# Patient Record
Sex: Female | Born: 1955 | Race: White | Hispanic: No | Smoking: Former smoker
Health system: Southern US, Community
[De-identification: ages and names within clinical notes are randomized; demographics above are authoritative.]

## PROBLEM LIST (undated history)

## (undated) DIAGNOSIS — N3010 Interstitial cystitis (chronic) without hematuria: Secondary | ICD-10-CM

## (undated) DIAGNOSIS — C3490 Malignant neoplasm of unspecified part of unspecified bronchus or lung: Secondary | ICD-10-CM

## (undated) DIAGNOSIS — G8929 Other chronic pain: Secondary | ICD-10-CM

## (undated) DIAGNOSIS — F419 Anxiety disorder, unspecified: Secondary | ICD-10-CM

## (undated) DIAGNOSIS — Z720 Tobacco use: Secondary | ICD-10-CM

## (undated) DIAGNOSIS — J449 Chronic obstructive pulmonary disease, unspecified: Secondary | ICD-10-CM

## (undated) DIAGNOSIS — K219 Gastro-esophageal reflux disease without esophagitis: Secondary | ICD-10-CM

## (undated) DIAGNOSIS — F32A Depression, unspecified: Secondary | ICD-10-CM

## (undated) DIAGNOSIS — J439 Emphysema, unspecified: Secondary | ICD-10-CM

## (undated) DIAGNOSIS — M549 Dorsalgia, unspecified: Secondary | ICD-10-CM

## (undated) HISTORY — PX: ABDOMINAL HYSTERECTOMY: SHX81

## (undated) HISTORY — DX: Gastro-esophageal reflux disease without esophagitis: K21.9

## (undated) HISTORY — PX: ILEO LOOP NEOBLADDER: SHX1781

## (undated) HISTORY — DX: Emphysema, unspecified: J43.9

## (undated) HISTORY — PX: BACK SURGERY: SHX140

## (undated) HISTORY — DX: Depression, unspecified: F32.A

## (undated) HISTORY — PX: VESICOVAGINAL FISTULA REPAIR: SHX320

---

## 1997-08-26 ENCOUNTER — Ambulatory Visit (HOSPITAL_COMMUNITY): Admission: RE | Admit: 1997-08-26 | Discharge: 1997-08-26 | Payer: Self-pay

## 1997-12-07 ENCOUNTER — Observation Stay (HOSPITAL_COMMUNITY): Admission: RE | Admit: 1997-12-07 | Discharge: 1997-12-08 | Payer: Self-pay | Admitting: Gynecologic Oncology

## 1997-12-28 ENCOUNTER — Ambulatory Visit: Admission: RE | Admit: 1997-12-28 | Discharge: 1997-12-28 | Payer: Self-pay | Admitting: Gynecologic Oncology

## 1998-01-31 ENCOUNTER — Ambulatory Visit: Admission: RE | Admit: 1998-01-31 | Discharge: 1998-01-31 | Payer: Self-pay | Admitting: Gynecologic Oncology

## 1998-03-21 ENCOUNTER — Encounter: Payer: Self-pay | Admitting: Gynecologic Oncology

## 1998-03-21 ENCOUNTER — Ambulatory Visit: Admission: RE | Admit: 1998-03-21 | Discharge: 1998-03-21 | Payer: Self-pay | Admitting: Gynecologic Oncology

## 1998-04-10 ENCOUNTER — Encounter: Payer: Self-pay | Admitting: Gastroenterology

## 1998-04-10 ENCOUNTER — Ambulatory Visit (HOSPITAL_COMMUNITY): Admission: RE | Admit: 1998-04-10 | Discharge: 1998-04-10 | Payer: Self-pay | Admitting: Gastroenterology

## 1998-05-07 ENCOUNTER — Emergency Department (HOSPITAL_COMMUNITY): Admission: EM | Admit: 1998-05-07 | Discharge: 1998-05-07 | Payer: Self-pay | Admitting: Emergency Medicine

## 1998-05-09 ENCOUNTER — Ambulatory Visit: Admission: RE | Admit: 1998-05-09 | Discharge: 1998-05-09 | Payer: Self-pay | Admitting: Gynecologic Oncology

## 1998-12-05 ENCOUNTER — Ambulatory Visit: Admission: RE | Admit: 1998-12-05 | Discharge: 1998-12-05 | Payer: Self-pay | Admitting: Gynecologic Oncology

## 1999-03-02 ENCOUNTER — Inpatient Hospital Stay (HOSPITAL_COMMUNITY): Admission: AD | Admit: 1999-03-02 | Discharge: 1999-03-03 | Payer: Self-pay | Admitting: Cardiology

## 1999-03-08 ENCOUNTER — Ambulatory Visit (HOSPITAL_COMMUNITY): Admission: RE | Admit: 1999-03-08 | Discharge: 1999-03-08 | Payer: Self-pay | Admitting: Orthopedic Surgery

## 1999-03-08 ENCOUNTER — Encounter: Payer: Self-pay | Admitting: Orthopedic Surgery

## 1999-04-02 ENCOUNTER — Ambulatory Visit (HOSPITAL_COMMUNITY): Admission: RE | Admit: 1999-04-02 | Discharge: 1999-04-02 | Payer: Self-pay | Admitting: Pediatrics

## 1999-04-02 ENCOUNTER — Encounter: Payer: Self-pay | Admitting: Orthopedic Surgery

## 1999-04-02 ENCOUNTER — Encounter: Payer: Self-pay | Admitting: Neurological Surgery

## 1999-04-19 ENCOUNTER — Encounter: Payer: Self-pay | Admitting: Neurological Surgery

## 1999-04-19 ENCOUNTER — Inpatient Hospital Stay (HOSPITAL_COMMUNITY): Admission: RE | Admit: 1999-04-19 | Discharge: 1999-04-23 | Payer: Self-pay | Admitting: Neurological Surgery

## 1999-07-03 ENCOUNTER — Ambulatory Visit: Admission: RE | Admit: 1999-07-03 | Discharge: 1999-07-03 | Payer: Self-pay | Admitting: Gynecologic Oncology

## 1999-07-18 ENCOUNTER — Encounter: Payer: Self-pay | Admitting: Neurological Surgery

## 1999-07-18 ENCOUNTER — Encounter: Admission: RE | Admit: 1999-07-18 | Discharge: 1999-07-18 | Payer: Self-pay | Admitting: Neurological Surgery

## 1999-08-15 ENCOUNTER — Encounter: Admission: RE | Admit: 1999-08-15 | Discharge: 1999-09-21 | Payer: Self-pay | Admitting: Neurological Surgery

## 1999-10-05 ENCOUNTER — Encounter: Payer: Self-pay | Admitting: Neurological Surgery

## 1999-10-05 ENCOUNTER — Encounter: Admission: RE | Admit: 1999-10-05 | Discharge: 1999-10-05 | Payer: Self-pay | Admitting: Neurological Surgery

## 1999-10-11 ENCOUNTER — Encounter: Admission: RE | Admit: 1999-10-11 | Discharge: 1999-12-28 | Payer: Self-pay | Admitting: Neurological Surgery

## 1999-12-20 ENCOUNTER — Ambulatory Visit (HOSPITAL_COMMUNITY): Admission: RE | Admit: 1999-12-20 | Discharge: 1999-12-20 | Payer: Self-pay | Admitting: Neurological Surgery

## 1999-12-20 ENCOUNTER — Encounter: Payer: Self-pay | Admitting: Neurological Surgery

## 2000-01-03 ENCOUNTER — Encounter: Payer: Self-pay | Admitting: Family Medicine

## 2000-01-03 ENCOUNTER — Encounter: Admission: RE | Admit: 2000-01-03 | Discharge: 2000-01-03 | Payer: Self-pay | Admitting: Family Medicine

## 2000-04-16 ENCOUNTER — Ambulatory Visit (HOSPITAL_COMMUNITY): Admission: RE | Admit: 2000-04-16 | Discharge: 2000-04-16 | Payer: Self-pay | Admitting: Neurological Surgery

## 2000-04-16 ENCOUNTER — Encounter: Payer: Self-pay | Admitting: Neurological Surgery

## 2000-06-12 ENCOUNTER — Encounter: Payer: Self-pay | Admitting: Neurological Surgery

## 2000-06-12 ENCOUNTER — Inpatient Hospital Stay (HOSPITAL_COMMUNITY): Admission: RE | Admit: 2000-06-12 | Discharge: 2000-06-14 | Payer: Self-pay | Admitting: Neurological Surgery

## 2000-07-16 ENCOUNTER — Encounter: Admission: RE | Admit: 2000-07-16 | Discharge: 2000-07-16 | Payer: Self-pay | Admitting: Neurological Surgery

## 2000-07-16 ENCOUNTER — Encounter: Payer: Self-pay | Admitting: Neurological Surgery

## 2000-09-02 ENCOUNTER — Encounter: Payer: Self-pay | Admitting: Neurological Surgery

## 2000-09-02 ENCOUNTER — Ambulatory Visit (HOSPITAL_COMMUNITY): Admission: RE | Admit: 2000-09-02 | Discharge: 2000-09-02 | Payer: Self-pay | Admitting: Neurological Surgery

## 2000-09-25 ENCOUNTER — Encounter: Admission: RE | Admit: 2000-09-25 | Discharge: 2000-10-29 | Payer: Self-pay | Admitting: Neurological Surgery

## 2000-11-25 ENCOUNTER — Ambulatory Visit: Admission: RE | Admit: 2000-11-25 | Discharge: 2000-11-25 | Payer: Self-pay | Admitting: Gynecology

## 2000-11-25 ENCOUNTER — Other Ambulatory Visit: Admission: RE | Admit: 2000-11-25 | Discharge: 2000-11-25 | Payer: Self-pay | Admitting: Gynecology

## 2000-12-04 ENCOUNTER — Ambulatory Visit (HOSPITAL_COMMUNITY): Admission: RE | Admit: 2000-12-04 | Discharge: 2000-12-04 | Payer: Self-pay | Admitting: Neurological Surgery

## 2000-12-04 ENCOUNTER — Encounter: Payer: Self-pay | Admitting: Neurological Surgery

## 2001-01-20 ENCOUNTER — Ambulatory Visit (HOSPITAL_COMMUNITY): Admission: RE | Admit: 2001-01-20 | Discharge: 2001-01-20 | Payer: Self-pay | Admitting: Family Medicine

## 2001-01-20 ENCOUNTER — Encounter: Payer: Self-pay | Admitting: Family Medicine

## 2001-01-30 ENCOUNTER — Encounter: Admission: RE | Admit: 2001-01-30 | Discharge: 2001-02-07 | Payer: Self-pay | Admitting: Anesthesiology

## 2001-02-11 ENCOUNTER — Encounter: Payer: Self-pay | Admitting: Family Medicine

## 2001-02-11 ENCOUNTER — Encounter: Admission: RE | Admit: 2001-02-11 | Discharge: 2001-02-11 | Payer: Self-pay | Admitting: Family Medicine

## 2001-12-29 ENCOUNTER — Encounter (INDEPENDENT_AMBULATORY_CARE_PROVIDER_SITE_OTHER): Payer: Self-pay | Admitting: *Deleted

## 2001-12-29 ENCOUNTER — Ambulatory Visit: Admission: RE | Admit: 2001-12-29 | Discharge: 2001-12-29 | Payer: Self-pay | Admitting: Gynecologic Oncology

## 2001-12-29 ENCOUNTER — Other Ambulatory Visit: Admission: RE | Admit: 2001-12-29 | Discharge: 2001-12-29 | Payer: Self-pay | Admitting: Gynecologic Oncology

## 2002-09-10 ENCOUNTER — Ambulatory Visit (HOSPITAL_BASED_OUTPATIENT_CLINIC_OR_DEPARTMENT_OTHER): Admission: RE | Admit: 2002-09-10 | Discharge: 2002-09-10 | Payer: Self-pay | Admitting: Urology

## 2002-09-10 ENCOUNTER — Encounter (INDEPENDENT_AMBULATORY_CARE_PROVIDER_SITE_OTHER): Payer: Self-pay | Admitting: *Deleted

## 2003-02-22 ENCOUNTER — Encounter: Admission: RE | Admit: 2003-02-22 | Discharge: 2003-02-22 | Payer: Self-pay | Admitting: Family Medicine

## 2003-02-22 ENCOUNTER — Encounter: Payer: Self-pay | Admitting: Family Medicine

## 2003-05-04 ENCOUNTER — Ambulatory Visit (HOSPITAL_COMMUNITY): Admission: RE | Admit: 2003-05-04 | Discharge: 2003-05-04 | Payer: Self-pay | Admitting: Neurological Surgery

## 2003-06-15 ENCOUNTER — Emergency Department (HOSPITAL_COMMUNITY): Admission: EM | Admit: 2003-06-15 | Discharge: 2003-06-15 | Payer: Self-pay

## 2003-06-16 ENCOUNTER — Inpatient Hospital Stay (HOSPITAL_COMMUNITY): Admission: EM | Admit: 2003-06-16 | Discharge: 2003-06-17 | Payer: Self-pay | Admitting: Emergency Medicine

## 2003-08-02 ENCOUNTER — Ambulatory Visit (HOSPITAL_COMMUNITY): Admission: RE | Admit: 2003-08-02 | Discharge: 2003-08-03 | Payer: Self-pay | Admitting: Neurological Surgery

## 2003-08-12 ENCOUNTER — Ambulatory Visit (HOSPITAL_COMMUNITY): Admission: RE | Admit: 2003-08-12 | Discharge: 2003-08-12 | Payer: Self-pay | Admitting: Urology

## 2003-08-26 ENCOUNTER — Ambulatory Visit (HOSPITAL_COMMUNITY): Admission: RE | Admit: 2003-08-26 | Discharge: 2003-08-26 | Payer: Self-pay | Admitting: Urology

## 2003-08-26 ENCOUNTER — Ambulatory Visit (HOSPITAL_BASED_OUTPATIENT_CLINIC_OR_DEPARTMENT_OTHER): Admission: RE | Admit: 2003-08-26 | Discharge: 2003-08-26 | Payer: Self-pay | Admitting: Urology

## 2004-06-18 ENCOUNTER — Inpatient Hospital Stay (HOSPITAL_COMMUNITY): Admission: RE | Admit: 2004-06-18 | Discharge: 2004-06-26 | Payer: Self-pay | Admitting: Urology

## 2004-06-19 ENCOUNTER — Encounter (INDEPENDENT_AMBULATORY_CARE_PROVIDER_SITE_OTHER): Payer: Self-pay | Admitting: Specialist

## 2004-08-02 ENCOUNTER — Ambulatory Visit (HOSPITAL_COMMUNITY): Admission: RE | Admit: 2004-08-02 | Discharge: 2004-08-02 | Payer: Self-pay | Admitting: Urology

## 2004-08-28 ENCOUNTER — Ambulatory Visit (HOSPITAL_COMMUNITY): Admission: RE | Admit: 2004-08-28 | Discharge: 2004-08-28 | Payer: Self-pay | Admitting: Urology

## 2004-09-19 ENCOUNTER — Inpatient Hospital Stay (HOSPITAL_COMMUNITY): Admission: RE | Admit: 2004-09-19 | Discharge: 2004-09-25 | Payer: Self-pay | Admitting: Urology

## 2004-10-10 ENCOUNTER — Ambulatory Visit (HOSPITAL_COMMUNITY): Admission: RE | Admit: 2004-10-10 | Discharge: 2004-10-10 | Payer: Self-pay | Admitting: Urology

## 2005-02-19 ENCOUNTER — Ambulatory Visit (HOSPITAL_BASED_OUTPATIENT_CLINIC_OR_DEPARTMENT_OTHER): Admission: RE | Admit: 2005-02-19 | Discharge: 2005-02-19 | Payer: Self-pay | Admitting: Urology

## 2005-02-19 ENCOUNTER — Ambulatory Visit (HOSPITAL_COMMUNITY): Admission: RE | Admit: 2005-02-19 | Discharge: 2005-02-19 | Payer: Self-pay | Admitting: Urology

## 2005-03-11 ENCOUNTER — Observation Stay (HOSPITAL_COMMUNITY): Admission: RE | Admit: 2005-03-11 | Discharge: 2005-03-12 | Payer: Self-pay | Admitting: Urology

## 2005-08-06 ENCOUNTER — Ambulatory Visit (HOSPITAL_BASED_OUTPATIENT_CLINIC_OR_DEPARTMENT_OTHER): Admission: RE | Admit: 2005-08-06 | Discharge: 2005-08-06 | Payer: Self-pay | Admitting: Urology

## 2005-09-10 ENCOUNTER — Encounter: Admission: RE | Admit: 2005-09-10 | Discharge: 2005-09-10 | Payer: Self-pay | Admitting: Family Medicine

## 2007-05-11 ENCOUNTER — Emergency Department (HOSPITAL_COMMUNITY): Admission: EM | Admit: 2007-05-11 | Discharge: 2007-05-11 | Payer: Self-pay | Admitting: Emergency Medicine

## 2007-05-14 ENCOUNTER — Encounter: Admission: RE | Admit: 2007-05-14 | Discharge: 2007-05-14 | Payer: Self-pay | Admitting: Gastroenterology

## 2009-05-11 ENCOUNTER — Ambulatory Visit (HOSPITAL_COMMUNITY): Admission: RE | Admit: 2009-05-11 | Discharge: 2009-05-11 | Payer: Self-pay | Admitting: Anesthesiology

## 2010-02-20 ENCOUNTER — Ambulatory Visit: Payer: Self-pay | Admitting: Internal Medicine

## 2010-02-27 LAB — CBC WITH DIFFERENTIAL/PLATELET
BASO%: 0.6 % (ref 0.0–2.0)
EOS%: 1.5 % (ref 0.0–7.0)
HCT: 38.5 % (ref 34.8–46.6)
LYMPH%: 29.3 % (ref 14.0–49.7)
MCH: 32.9 pg (ref 25.1–34.0)
MCHC: 33.5 g/dL (ref 31.5–36.0)
MCV: 98.2 fL (ref 79.5–101.0)
MONO#: 0.3 10*3/uL (ref 0.1–0.9)
NEUT%: 59.6 % (ref 38.4–76.8)
Platelets: 132 10*3/uL — ABNORMAL LOW (ref 145–400)

## 2010-02-27 LAB — COMPREHENSIVE METABOLIC PANEL
ALT: 204 U/L — ABNORMAL HIGH (ref 0–35)
CO2: 26 mEq/L (ref 19–32)
Creatinine, Ser: 0.74 mg/dL (ref 0.40–1.20)
Total Bilirubin: 0.6 mg/dL (ref 0.3–1.2)

## 2010-02-27 LAB — LACTATE DEHYDROGENASE: LDH: 286 U/L — ABNORMAL HIGH (ref 94–250)

## 2010-03-06 LAB — VON WILLEBRAND PANEL
Ristocetin-Cofactor: 150 % (ref 50–150)
Von Willebrand Ag: 122 % normal (ref 61–164)

## 2010-03-07 LAB — CBC WITH DIFFERENTIAL/PLATELET
BASO%: 0.3 % (ref 0.0–2.0)
EOS%: 1.2 % (ref 0.0–7.0)
LYMPH%: 24.5 % (ref 14.0–49.7)
MCH: 34.3 pg — ABNORMAL HIGH (ref 25.1–34.0)
MCHC: 34.8 g/dL (ref 31.5–36.0)
MCV: 98.8 fL (ref 79.5–101.0)
MONO#: 0.4 10*3/uL (ref 0.1–0.9)
MONO%: 7.5 % (ref 0.0–14.0)
Platelets: 135 10*3/uL — ABNORMAL LOW (ref 145–400)
RBC: 4.06 10*6/uL (ref 3.70–5.45)
WBC: 4.7 10*3/uL (ref 3.9–10.3)

## 2010-03-07 LAB — COMPREHENSIVE METABOLIC PANEL
ALT: 101 U/L — ABNORMAL HIGH (ref 0–35)
AST: 92 U/L — ABNORMAL HIGH (ref 0–37)
Alkaline Phosphatase: 626 U/L — ABNORMAL HIGH (ref 39–117)
Sodium: 139 mEq/L (ref 135–145)
Total Bilirubin: 0.9 mg/dL (ref 0.3–1.2)
Total Protein: 7.5 g/dL (ref 6.0–8.3)

## 2010-03-08 LAB — CEA: CEA: 1.9 ng/mL (ref 0.0–5.0)

## 2010-03-08 LAB — SEDIMENTATION RATE: Sed Rate: 8 mm/hr (ref 0–22)

## 2010-03-09 ENCOUNTER — Ambulatory Visit (HOSPITAL_COMMUNITY): Admission: RE | Admit: 2010-03-09 | Discharge: 2010-03-09 | Payer: Self-pay | Admitting: Internal Medicine

## 2010-03-13 LAB — CBC WITH DIFFERENTIAL/PLATELET
BASO%: 0.4 % (ref 0.0–2.0)
EOS%: 1.3 % (ref 0.0–7.0)
HCT: 42.9 % (ref 34.8–46.6)
LYMPH%: 25.4 % (ref 14.0–49.7)
MCH: 33.2 pg (ref 25.1–34.0)
MCHC: 33.9 g/dL (ref 31.5–36.0)
NEUT%: 66.8 % (ref 38.4–76.8)
Platelets: 136 10*3/uL — ABNORMAL LOW (ref 145–400)
RBC: 4.38 10*6/uL (ref 3.70–5.45)
lymph#: 1.1 10*3/uL (ref 0.9–3.3)

## 2010-03-13 LAB — COMPREHENSIVE METABOLIC PANEL
ALT: 315 U/L — ABNORMAL HIGH (ref 0–35)
AST: 232 U/L — ABNORMAL HIGH (ref 0–37)
Creatinine, Ser: 0.72 mg/dL (ref 0.40–1.20)
Total Bilirubin: 1 mg/dL (ref 0.3–1.2)

## 2010-04-06 ENCOUNTER — Ambulatory Visit (HOSPITAL_COMMUNITY): Admission: RE | Admit: 2010-04-06 | Discharge: 2010-04-06 | Payer: Self-pay | Admitting: Gastroenterology

## 2010-04-26 ENCOUNTER — Ambulatory Visit: Payer: Self-pay | Admitting: Internal Medicine

## 2010-07-01 ENCOUNTER — Encounter: Payer: Self-pay | Admitting: Emergency Medicine

## 2010-07-01 ENCOUNTER — Encounter: Payer: Self-pay | Admitting: Family Medicine

## 2010-08-22 LAB — CBC
Hemoglobin: 14 g/dL (ref 12.0–15.0)
MCHC: 34.7 g/dL (ref 30.0–36.0)
WBC: 9.3 10*3/uL (ref 4.0–10.5)

## 2010-08-22 LAB — PROTIME-INR
INR: 0.91 (ref 0.00–1.49)
Prothrombin Time: 12.5 seconds (ref 11.6–15.2)

## 2010-10-26 NOTE — H&P (Signed)
[name], [name]                ACCOUNT NO.:  [id number]   MEDICAL RECORD NO.:  [id number]          PATIENT TYPE:  INP   LOCATION:  0199                         FACILITY:  [facility]   PHYSICIAN:  Jamison Neighbor, M.D.  DATE OF BIRTH:  1955-08-30   DATE OF ADMISSION:  06/18/2004  DATE OF DISCHARGE:                                HISTORY & PHYSICAL   ADMISSION DIAGNOSES:  1.  End stage interstitial cystitis.  2.  Chronic pelvic pain.  3.  Esophageal reflux disease.   HISTORY:  This 55 year old female has end stage interstitial cystitis.  The  patient has failed to respond to oral therapy, instillation therapy,  Interstim therapy times two and is now at the point where she feels that  there is nothing left for her other than to have her bladder removed.  Her  recent cystoscopy and hydrodistention showed that her bladder capacity has  decreased to less than 300 mL which compares quite unfavorably to a normal  bladder capacity under anesthesia of 1150 mL and an average for interstitial  cystitis population of 575.  The patient has been on methadone and still  does not have good pain relief.  She has frequency approximately every 15  minutes due to the small size of her bladder and the failure to respond to  either Interstim or anticholinergic therapy.  After careful discussion of  the pros and cons, the patient has requested that the cystectomy be  performed.  She is aware of the potential complications associated with  disease including fistula formation, obstruction, anastomotic leaks and a  need for various loss of  revision surgeries.  The patient is also aware of  the fact that there is certainly no guarantee that she will have pain relief  with this procedure.  She does know that there are options for diversion  including ileal conduit diversion or [place] pouch type diversion to the  skin but she has requested that a neobladder be performed.  Specific  complications that were  discussed with her included the possibility of  obstruction, stress incontinence of vesicovaginal fistula. The patient  understands the risks and benefits of the procedure.  She is to be admitted  the night before for bowel prep and preparation for her cystectomy.   PAST MEDICAL HISTORY:  Generally unremarkable.  She is known to have  esophageal reflux disease as well as some constipation caused her by  her  pain medication.  She has had some spasms in her throat of undetermined  etiology.  Previous surgery included a hysterectomy.  She has also had the  Interstim implanted and removed due to a lack of response.  The patient has  had L5-S1 fixation hardware that has been removed.  She has had lumbar  surgeries times two.  The patient had had cystoscopy and hydrodistention  which had delineated her interstitial cystitis.   MEDICATIONS ON ADMISSION:  1.  Methadone 5 mg three times to four times daily.  2.  Xanax up to three tablets at night.  3.  Nexium 40 mg daily.  4.  Cymbalta 20 mg twice daily.  5.  Lidoderm patch.  6.  Estraderm patch.  7.  Elmiron 2 twice daily.  8.  Atarax 25 to 50 mg at bedtime.  9.  Advil.  10. Sudafed.  11. MiraLax.  12. Colace.   SOCIAL HISTORY:  Remarkable for smoking one and a half to two packs a day  for 20 years.  The patient has stopped prior to the procedure and has been  informed that she will not be allowed to smoke in the hospital and will be  sent home with medications to try to help her stop smoking.   FAMILY HISTORY:  Noncontributory.   REVIEW OF SYSTEMS:  Pertinent for the bladder pain as well as her occasional  throat spasm but otherwise is unremarkable.  She has no other ongoing  cardiac, pulmonary, musculoskeletal, neurologic, endocrine, vascular,  gastrointestinal, dermatologic, gynecologic or psychological disorders other  than that described above.   The patient does have __________ Neurontin, methadone and fentanyl.   PHYSICAL  EXAMINATION:  GENERAL:  She is a well-developed, well-nourished  female with obvious pain and significant anxiety prior to this procedure.  VITAL SIGNS:  Temperature 98.6, pulse 79, respiratory rate 18, blood  pressure 119/70.  Current weight 140.  HEENT:  Normocephalic and atraumatic.  Cranial nerves 2-12 were grossly  intact.  NECK:  Supple, no adenopathy or thyromegaly.   Her spine was straight.  The patient has a well-healed incision from her  previous surgeries.  LUNGS:  Her lungs were clear.  HEART:  Her heart had a regular rate and rhythm with no murmurs, thrills,  gallops, rubs or heaves.  ABDOMEN:  Soft.  Tenderness was pretty much limited to the lower abdomen.  She has not previous incisions having had a vaginal hysterectomy.   She does not have any significant cystocele, rectocele.  There are no masses  on bimanual exam.  The bladder has been checked on multiple occasions and is  exquisitely tender.  The urethra is unremarkable.  EXTREMITIES:  No cyanosis, clubbing or edema.  Peripheral pulses are normal.  Vascular system is intact.  NEUROLOGIC:  System is unremarkable.   IMPRESSION:  End stage interstitial cystitis.   PLAN:  Admit in preparation for planned cystectomy and ileal conduit,  neobladder on June 19, 2004.      RJE/MEDQ  D:  06/19/2004  T:  06/19/2004  Job:  [id number]   cc:   Ernestina Penna, M.D.  [address] [place]  [place] [address]  Fax: [phone]

## 2010-10-26 NOTE — Op Note (Signed)
NAMEAKIERA, ALLBAUGH                ACCOUNT NO.:  [id number]   MEDICAL RECORD NO.:  [id number]          PATIENT TYPE:  AMB   LOCATION:  NESC                         FACILITY:  [facility]   PHYSICIAN:  Martina Sinner, MD DATE OF BIRTH:  1956-05-18   DATE OF PROCEDURE:  DATE OF DISCHARGE:                                 OPERATIVE REPORT   No dictation for this job.           ______________________________  Martina Sinner, MD  Electronically Signed     SAM/MEDQ  D:  08/06/2005  T:  08/07/2005  Job:  [id number]

## 2010-10-26 NOTE — Discharge Summary (Signed)
[name], [name]                ACCOUNT NO.:  [id number]   MEDICAL RECORD NO.:  [id number]          PATIENT TYPE:  INP   LOCATION:  0361                         FACILITY:  [facility]   PHYSICIAN:  Jamison Neighbor, M.D.  DATE OF BIRTH:  [birth year]   DATE OF ADMISSION:  09/19/2004  DATE OF DISCHARGE:  09/25/2004                                 DISCHARGE SUMMARY   DISCHARGE DIAGNOSES:  1.  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  2.  History of chronic interstitial cystitis.  3.  Esophageal reflux disease.  4.  History of tobacco use.   PROCEDURES:  Transabdominal and transvaginal repair of the fistula between  the neobladder and vagina.   HISTORY OF PRESENT ILLNESS:  This 55 year old female has end-stage  interstitial cystitis, and required cystectomy and continent urinary  diversion.  From a purely urologic standpoint, the patient did quite nicely  after the procedure.  She recovered well.  She had near-complete resolution  of her pain.  She had good return of bowel status, but the only problem that  she developed was a fistula between the vagina and the neobladder.  The  patient's fistula did not improve with catheter drainage.  She is to be  admitted for repair of the fistula.  The patient knows that if it is at all  possible to do this transvaginally it will be done, but it is likely we will  have to go in through the neobladder to repair this fistula.  She is aware  of all of the potential risks and benefits including damage to the  neobladder requiring conversion to a ileal conduit.  She gave full informed  consent.  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   PAST MEDICAL HISTORY:  Otherwise remarkable for chronic back pain.  Aside  from the back problems, she really does not have a significant past medical  history.  She does have a history of tobacco use, and is known to  have  esophageal reflux as well as possible irritable bowel syndrome.  There is a  past history of MRSA infection which may be a contributing factor to the  development of her fistula.   POSTOPERATIVE COURSE:  The patient's postoperative course following the  repair was unremarkable.  She had very modest drainage of serous fluid from  her JP drain.  Her drains remained dry.  She had good drainage of clear  urine out of both the suprapubic tube that was placed at the time of surgery  and the Foley catheter.  We began to advance her diet.  The patient  tolerated the diet without difficulty.  She had acceptable postoperative  laboratory studies, and required no blood transfusion.  The patient had a  little bit of a postoperative temperature on September 22, 2004 which  stabilized.  We felt that the patient was ready for discharge by September 25, 2004.  At that time, she had a nice clean incision with no signs of any  infection.  The suprapubic tube site looked fine.  She had no vaginal  drainage of any kind.  The Foley catheter was still draining.  The patient  was sent home with Mycolog cream for some old yeast vaginitis along with  Septra DS and Diflucan.  Septra was picked for its properties against MRSA.  The patient will return in one week for removal of staples and possible  removal of the JP.  She will eventually have  a cystogram done about 3 weeks out, at which point the suprapubic tube will  be clamped, and we will assess the quality of her bladder outlet.  If the  patient has some incontinence problems at that point, she can certainly be  evaluated for placement of a sling and/or __________ injection.      RJE/MEDQ  D:  10/05/2004  T:  10/05/2004  Job:  [id number]   cc:   Jamison Neighbor, M.D.  [address]  [place]  [place] [address]  Fax: [phone]   Hospital Chart

## 2010-10-26 NOTE — Consult Note (Signed)
[facility]  Patient:    [name], [name] Visit Number: [id number] MRN: [id number]          Service Type: GON Location: GYN Attending Physician:  Sabino Donovan Dictated by:   Jackquline Denmark. Kyla Balzarine, M.D. Admit Date:  12/29/2001 Discharge Date: 12/29/2001   CC:         Thyra Breed, M.D.  Stefani Dama, M.D.  Telford Nab, R.N.   Consultation Report  [name] returns for ongoing gynecologic care.  INTERVAL HISTORY:  Since the patient was last seen a year ago, she has continued to have significant difficulties with lower back pain, predominantly in the lumbosacral region.  She has required back surgery and despite this has continued to have back pain.  She has been on a variety of medications and is currently on a methadone regimen with best control of pain.  She notes that hot flashes have essentially ceased since beginning Paxil.  HISTORY OF PRESENT ILLNESS:  The patient underwent vaginal hysterectomy for menorrhagia, pelvic pain, and uterine fibroids.  Ovaries were left in place at that time but she had persistent menopausal type symptoms that did respond initially to hormonal replacement.  PAST MEDICAL HISTORY:  Colon/renal calculi, chronic pain syndrome.  PAST SURGICAL HISTORY:  Vaginal hysterectomy, right elbow surgery, several lumbar laminectomies.  FAMILY HISTORY:  Noncontributory.  PERSONAL SOCIAL HISTORY:  Continues to smoke one pack+ per day tobacco. Admits rare ethanol.  MEDICATIONS:  Estraderm, Ativan, methadone, and Paxil.  ALLERGIES:  DURAGESIC PATCH.  REVIEW OF SYSTEMS:  Otherwise negative.  PHYSICAL EXAMINATION  VITAL SIGNS:  Weight 132 pounds, blood pressure 110/78.  Vital signs stable and afebrile as noted in the clinic notes.  GENERAL:  The patient is alert and oriented x3, in no acute distress.  HEENT:  Benign with clear oropharynx.  NECK:  Supple without goiter.  LUNGS:  Fields are clear.  BREASTS:   Background fibrocystic changes without a dominant mass or discharge.  ABDOMEN:  Scaphoid, soft, and benign without ascites, mass, or organomegaly.  EXTREMITIES:  Full strength and range of motion.  PELVIC:  External genitalia and BUS are normal to inspection and palpation. The bladder and urethra are well supported and there are no vaginal mucosal lesions.  Cervix and uterus are surgically absent.  Bimanual and rectovaginal examinations reveal no mass or nodularity.  ASSESSMENT:  Hormonal replacement, chronic pain syndrome.  PLAN:  Pap smear updated.  It is recommended that the patient update her mammograms.  We had a discussion regarding risks and benefits of Estraderm patch and she has an up to date prescription for this over the next 12 months. Dictated by:   Jackquline Denmark. Kyla Balzarine, M.D. Attending Physician:  Ronita Hipps T DD:  12/29/01 TD:  01/02/02 Job: [id number]

## 2010-10-30 NOTE — H&P (Signed)
[place]  Patient:    [name], [name]                       MRN: [id number] Adm. 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  HISTORY OF PRESENT ILLNESS:  The patient is a 55 year old, right-handed individual, who works as as Curator and a Surveyor, mining.  She had been seen by me since 1999 when she had back pain that developed into right lower extremity pain, and ultimately on April 19, 1999, she underwent decompression and fusion of L4, L5.  Diskography was concordant and because of degenerative changes at that level, it was felt that single level arthrodesis would be appropriate.  The patient did well initially, recovering without much residual pain and was progressingnicely until she had an incident at a home where she tripped over a coffee table.  She landed hard onto her buttocks and subsequently developed significant back pain.  The back pain became unrelenting.  Radiographs demonstrated that her arthrodesis at L4-5 remained stable.  However, after treating this process for nine months very conservatively, the patient continued to have pain and a repeat MRI demonstrated there is now some signsof disk degeneration L5-S1.  Having failed efforts at conservative management including epidural steroid injections, and extensive efforts of physical therapy, re-bracing, the patient underwent a repeat diskogram whichnow is concordant at L5-S1.  L3-4 and L2-3 did not reproduce any pain.  It is felt that she has a solid arthrodesis at L4-5 and she is now being readmitted to the hospital to undergo decompression and arthrodesis at L5-S1.  She does complain of pain in the left lumbar radicular distribution, but the major component of her pain is primarily back pain.  PAST MEDICAL HISTORY:  Reveals that  her general health has been very good. She had damage in her right arm in 1985, 1987.  Breast biopsy in the past couple of years has been negative.  She had a hysterectomy; this had been noted in the history of present illness.  Because it was after here hysterectomy that she developed difficulty with back pain.  MEDICATIONS: 1. Tavist-D for sinuses. 2. Ibuprofen up to four times a day among other nonsteroidal    antiinflammatories that have been tried.  ALLERGIES:  The patient has no allergies to any medications.  SOCIAL HISTORY:  Reveals that she is married.  She has no children.  She works as a Curator for [employer]	[place], had been out of work until the recent past month or so where it was attempted to return her to the work place.  She tolerated this for only a few days before the pain became severe and excruciating, and then she decided that ultimately she would require the surgical stabilization.  She maintains a good level of physical activity and maintains her health well.  HABITS:  The patient did smoke about a pack and a half of cigarettes a day. She stopped smoking for a period of time, but had resumed smoking for a brief period of time recently because of her distress with the recurrent back pain and upcoming surgery.  Height and weight have been stable.  She does not drink alcohol.  REVIEW OF SYSTEMS:  Notable for  sinus problems, sinus headaches, urinary incontinence that had been problematic after her hysterectomy, abdominalpain, blood in the urine and back pain.  PHYSICAL EXAMINATION:  GENERAL:  Physical examination reveals that she is an alert, oriented, cooperative individual.  VITAL SIGNS:  Her blood pressure is 115/72, heart rate is 66 and regular, respirations 16.  HEENT:  Cranial nerve examination reveals her pupils are 4 mm, brisk, reactive to light and accommodation.  The extraocular movements are full.  The face is symmetric to grimace.  Tongue and  uvular are in the midline.  Sclerae and conjunctivae are clear.  NECK:  Reveals no masses and no bruits are heard.  BACK:  Examination of the back reveals that she has tenderness to palpitation in the sacral notches on either side.  There istenderness to percussion in the midline in the back.  She flexes forward60 degrees with significant and quite severe pain.  She has a moderate amount of fair vertebral spasm with this maneuver.  She extends 10 degrees.  Motor strength in the lower extremities reveals the iliopsoas, quadriceps, tibialis, anterior and gastrocnemius have good strength.  Tone and bulk confrontation.  Deep tendon reflexes are 2+ in the patella, 1+ in both Achilles.  Babinskis are downgoing.  Straight leg raising is positive on the left side, 30 degrees and positive on the right side, and 60 degrees for some left leg pain and low back pain primarily.  Patricks maneuver is negative.  Sensation is intact distally in the lower extremities.  Station and gait is otherwise normal.  LUNGS:  General physical examination reveals the lungs are clear to auscultation.  HEART:  Regular rate and rhythm.  No murmurs are heard.  ABDOMEN:  Soft.  Bowel sounds are positive.  No masses are palpable.  EXTREMITIES:  Reveal no clubbing, cyanosis, or edema.  IMPRESSION:  The patient has evidence of spondylotic degeneration at L5-S1 with a positive concordant diskogram.  She is now being admitted to undergo surgical stabilization at L5-S1via T-lift procedure.  Hardware at L4-5 will be removed.  This will be replaced with new hardware at L5-S1.    3. DD:  06/12/00 TD:  06/12/00 Job: [id number]

## 2010-10-30 NOTE — Discharge Summary (Signed)
NAME:  [name], [name]                          ACCOUNT NO.:  [id number]   MEDICAL RECORD NO.:  [id number]                   PATIENT TYPE:  INP   LOCATION:  6529                                 FACILITY:  [facility]   PHYSICIAN:  Dani Gobble, MD                    DATE OF BIRTH:  07-Oct-1955   DATE OF ADMISSION:  06/16/2003  DATE OF DISCHARGE:  06/17/2003                                 DISCHARGE SUMMARY   ATTENDING PHYSICIAN:  Dr. Domingo Sep.   DISCHARGE DIAGNOSES:  1. Chest pain resolved ruled out for myocardial infarction by serial enzymes     and negative EKGs.  2. Chronic back pain status post two lumber fusion surgeries on chronic pain     medications.  3. Interstitial cystitis awaits for IntraStent placement on July 01, 2003     by Dr. Logan Bores.  4. Chronic obstructive pulmonary disease.  5. Ongoing tobacco use.  6. Triglyceridemia.   HISTORY OF PRESENT ILLNESS AND HOSPITAL COURSE:  This is a 55 year old  Caucasian female patient of [name] who presented to the emergency room  with complaints of chest pain.  She stated onset of pain was Tuesday,  January 4th.  She came to the emergency room but EKG was done but the  patient could not wait more than 4 hours for a complete evaluation and came  back home.  The next day she still felt tired and experienced some pain and  on the 6th the pain became more extreme.  Also started having some tingling  in the left side of her neck and jaw and pain in the left arm and numbness  of the left arm.  Her husband called our office and was advised to bring the  patient to the emergency room.  Along with those symptoms, she also felt  nauseated and diaphoretic.   In the emergency room, the patient was evaluated by Dr. Domingo Sep and  admitted to the telemetry unit.   Her cardiac enzymes were negative x3.  Her EKG did not show any changes.   Her lipid profile revealed cholesterol 174, triglycerides 323, HDL 48, LDL  61.   Chest CT showed no  pulmonary embolism and no dissecting aorta.  It showed  also COPD and interstitial disease at the bases of the lungs.   Abdominal CT showed edema of the mucosa of the distal stomach, questionable  gastritis, and mild periportal adenopathy.   The next day, the patient was pain-free, ambulated without difficulty and  after Dr. Domingo Sep assessed the patient, she was deemed to be stable for a  discharge home.   I spoke with Dr. Logan Bores about the patient's condition and her urinary  frequency and urgency with her interstitial cystitis and Dr. Logan Bores suggested  we do not continue Foley catheter and send the patient home on the current  medications and add Pyridium for her discomfort and  she awaits IntraStent  placement on January 21st for her interstitial cystitis.  This is a new  procedure and the patient was informed about the possible complications and  even possible failure of this surgery to relieve her symptoms.   She is being discharged home on the current medications and will be seen by  Madaline Savage, M.D. on Monday, January 10th, at 12 o'clock.  In case Dr.  Elsie Lincoln decides with proceed with Cardiolite in light of her upcoming surgery  since the patient had strong family history of coronary artery disease.   MEDICATIONS:  1. Methadone 5 mg t.i.d.  2. Effexor 75 mg daily.  3. Atarax 25 mg three pills at night.  4. Xanax 0.5 mg daily to b.i.d.  5. Nexium 40 mg daily.  6. Estraderm patches every 3 days.  7. Elmiron 100 mg daily.  8. Pyridium 100 mg b.i.d. to t.i.d.   ACTIVITY:  As tolerated.   DIET:  Low fat, low sugar, low alcohol diet to help reduce the triglycerides  level.   FOLLOW UP:  Follow up appointment June 20, 2003 at 12 o'clock with Dr.  Elsie Lincoln.      [name], P.A.                    Dani Gobble, MD    MK/MEDQ  D:  06/17/2003  T:  06/18/2003  Job:  [id number]

## 2010-10-30 NOTE — Discharge Summary (Signed)
[place]  Patient:    [name]                        MRN: [id number] Adm. Date:  [id number] Disc. Date: 04/23/99 Attending:  Burnard Bunting                           Discharge Summary  ADMISSION DIAGNOSIS:  Lumbar spondylosis L4-L5 with back pain and lumbar radiculopathy.  DISCHARGE DIAGNOSIS:  Lumbar spondylosis L4-L5 with back pain and lumbar radiculopathy.  OPERATION:  L4-L5 diskectomy, posterior interbody arthrodesis with Ray cage technique, supplemental fixation with pedicle screws L4-L5, bilateral lateral arthrodesis with iliac crest bone graft on April 19, 1999.  CONDITION ON DISCHARGE:  Improving.  ADMISSION HISTORY:  The patient is a 55 year old individual who has had significant back and bilateral leg pain.  She has degenerative disk disease at L4-L5 with significant back pain that has been unrelenting for a year and a half despite maximal efforts at conservative management.  HOSPITAL COURSE:  She was taken to the operating room where she underwent diskectomy and posterior interbody arthrodesis then supplemental fixation with pedicle screws and posterior lateral grafting with iliac crest bone graft that as obtained from a separate fascial incision.  She tolerated the procedure well. he was gradually mobilized and complained of significant back pain while in the hospital which gradually abated.  She had significant constipation also and was  placed on laxatives in addition to stool softeners.  At the time of discharge she is ambulatory, she has good neurological function in her lower extremities, her  incision is clean and dry.  DISCHARGE MEDICATIONS:  She is given a prescription for Percocet #60 without refills, Valium 5 mg #40 without refills.  FOLLOWUP:  She will be seen in the office in two weeks time. DD:  04/23/99 TD:  04/24/99 Job: [id number]

## 2010-10-30 NOTE — Discharge Summary (Signed)
[name], [name]                ACCOUNT NO.:  [id number]   MEDICAL RECORD NO.:  [id number]          PATIENT TYPE:  INP   LOCATION:  0366                         FACILITY:  [facility]   PHYSICIAN:  Jamison Neighbor, M.D.  DATE OF BIRTH:  Mar 31, 1956   DATE OF ADMISSION:  06/18/2004  DATE OF DISCHARGE:  06/26/2004                                 DISCHARGE SUMMARY   DISCHARGE DIAGNOSES:  1.  Chronic interstitial cystitis.  2.  Esophageal reflux disease.  3.  Tobacco use.  4.  Chronic pelvic pain.   PRINCIPAL PROCEDURE:  Cystectomy and ileoneobladder done on June 19, 2004.   HISTORY:  This 55 year old female has end-stage interstitial cystitis.  She  has failed to respond to oral therapy, instillation therapy, InterStim  therapy x 2, and has come to the conclusion that there is very little that  can be offered to her.  The patient's bladder capacity has decreased to less  than 300, which is markedly diminished, and she has requested that she  undergo a form of urinary diversion.  She was apprised as to the  possibilities, including neobladder, ileal conduit, or orthotopic diversion  to the skin, and she has elected to undergo the neobladder with diversion to  the urethra.  She understands all the pros and cons and is admitted for a  bowel prep in preparation for the surgery.  The initial history and physical  nicely delineates her past medical history, her medications, family history,  and review of systems.   The patient was taken to the operating room the day after admission.  She  had been given an adequate bowel prep, and all appropriate laboratory  studies were obtained.  She underwent successful cystectomy and  ileoneobladder.  She had a 24 [place] Hematuric catheter per urethra, an NG  tube, and two #10 flat Blake drains along with __________ single J suture to  the Foley.  The patient's postoperative course was generally unremarkable.  By June 21, 2004 she was ready for  transfer up to the floor.  The patient  did have a decrease in her hemoglobin to 7.5, and Dr. Vernie Ammons, the on-call  physician, considered a transfusion.  However, the patient was very anxious  about this and asked if I would avoid this as long as she remained stable.  I did feel that the patient was stable and thought that it would be safe not  to go ahead and give her the blood.  The patient did respond nicely to iron  and did feel quite a bit better by the time of discharge.  The patient was  eventually advanced to a regular diet once the NG tube was removed.  It was  a little bit slow to recover, and there was some prolonged postoperative  ileus.  However, the patient eventually was able to tolerate a regular diet.  On June 26, 2004, she took a regular diet with only moderate nausea,  which she thought was due to medication.  We felt it was all right for her  to go home with the Foley  in place.  During the hospital stay, we started  the patient on a nicotine patch.  She also was sent home with iron; Septra  DS, taken one daily; Phenergan for nausea; Percocet for pain; and we asked  her to continue her use of methadone; Xanax; Cymbalta; Nexium; Lidoderm;  MiraLax; Climara that she already has.  Eventually, we will want to  discontinue the methadone and pain management, depending on her overall  response to the cystectomy and neobladder.    RJE/MEDQ  D:  07/10/2004  T:  07/10/2004  Job:  [id number]

## 2010-10-30 NOTE — Op Note (Signed)
[name], [name]                ACCOUNT NO.:  [id number]   MEDICAL RECORD NO.:  [id number]          PATIENT TYPE:  INP   LOCATION:  0199                         FACILITY:  [facility]   PHYSICIAN:  Jamison Neighbor, M.D.  DATE OF BIRTH:  [birth year]   DATE OF PROCEDURE:  06/19/2004  DATE OF DISCHARGE:                                 OPERATIVE REPORT   PREOPERATIVE DIAGNOSIS:  End-stage interstitial cystitis.   POSTOPERATIVE DIAGNOSIS:  End-stage interstitial cystitis.   PROCEDURE:  Cystectomy and ileal neo-bladder.   SURGEON:  Jamison Neighbor, M.D.   ASSISTANT:  Thyra Breed, M.D.   ANESTHESIA:  General.   COMPLICATIONS:  None.   DRAINS:  67 French hematuria catheter sutured to bilateral single J stents.  Two #10 flat Blake drains.  NG tube.   COMPLICATIONS:  None.   NOTE:  The patient received approximately 250 mL of blood from the Cell  Saver, and no other transfusions were required.   HISTORY:  This 55 year old female has end-stage interstitial cystitis.  The  patient has failed to respond to oral therapy, instillation therapy,  hydrodistention, or InterStim, which she failed x2.  The patient has severe  intractable pain due primarily to the interstitial cystitis and chronic  pelvic pain but due at least in part to some back pain as well.  The patient  is currently on methadone without good relief of her pain.  Her most recent  hydrodistention showed a bladder capacity of less than 300 mL.  This  compares very unfavorably to a normal bladder capacity of 1150 and an  average for interstitial cystitis of 575.  Because of the lack of response,  the patient's poor quality of life, and her intractable pain, she has  requested that a cystectomy be performed.  She has been apprised of the pros  and cons.  Specific points that were raised include the fact that there was  no guarantee that she would have relief of her pain with this procedure, and  cystectomy certainly carries  significant risks and complications, including  injury to adjacent organs, the need for blood transfusion as well as the  possible need for additional surgery.  She was given three options for  urinary diversion, namely the ileal conduit, the ileal neo-bladder, or a  continent cutaneous stoma like an [place] pouch.  The patient requested the  neo-bladder and specifically realized that there is a risk she will not be  able to empty and will have to be catheterized as well as the opposite risk  that she may have problems with incontinence.  She is aware of the potential  need for revision surgery, particularly of the ureters, and the possibility  that she could develop a leak and/or fistula.  She understands the risks and  benefits of the procedure and gave full informed consent.   PROCEDURE:  After successful induction of general anesthesia, the patient  was placed in a frogleg position.  She was fully prepped and draped in the  usual sterile fashion.  PAS stockings were in place.  The patient received  appropriate bowel prep as well as antibiotic preparation, which included  Cefotan, Flagyl, as well as Neomycin and erythromycin base.  The patient had  a lower midline incision made from the symphysis pubis up to the umbilicus.  This was carried down through Scarpa's fascia until the rectus sheath was  identified.  The rectus sheath was opened in the midline, allowing entry  both into the space of Retzius as well as into the peritoneum.  The  abdominal contents were packed off superiorly.  The patient was found to  have an unremarkable appendix, which was left in place.  The Bookwalter  retractor was inserted and used to retract the bowel superiorly.  The  retroperitoneum was opened on each side, and the ureter was identified.  On  the right-hand side it was found just below the cecum.  A right angle clamp  was placed around the ureter, which was then freed up down to the bladder.  It was  eventually doubly clipped and transected.  On the left-hand side the  ureter was found on the outside of the sigmoid colon.  It was also freed up  down to the bladder, doubly clipped and ligated.  The ovary on each side was  left on place along with its blood supply.  The patient had previously  undergone a hysterectomy.  The ovaries appeared to be unremarkable.  The  only positive finding was some cysts on the right-hand side, which were  drained for the patient.  The bottom of the peritoneal reflection separating  the space of Retzius from the abdomen was taken down on each side with the  LigaSure.  This was taken down all the way to the level of the bladder.  Posterior dissection was then performed to dissect the peritoneum off the  back side of the bladder.  A plane was then developed between the vagina and  the bladder.  The LigaSure was used to take down the blood supply.  Where  necessary, hemostasis was obtained with figure-of-eight sutures of 2-0  Vicryl.  The dissection was taken all the way down to the bladder neck.  The  bladder neck was identified and was transected, and the bladder was removed  as a specimen.  There were a few small openings in the vagina, which were  then carefully oversewn in multiple layers in order to ensure that there was  good closure and no risk of fistula formation.  This was all done with  Vicryl sutures.  The urethra was then prepared for the eventual anastomosis  by placing six separate double-armed sutures in a circumferential fashion  around the urethra.  The pelvic area was then packed of while the bowel was  prepared for the neo-bladder.  The arcades of the bowel were easily  identified and an appropriate spot for transection was marked off 15-20 cm  from the terminal ileum.  A length of ileum 70 __________ was utilized with  20 __________  planned for the neo-bladder itself and 10 __________ for the [employer] loop.  The bowel was transected in two  places.  They were marked with  the GIA.  The mesentery was taken down with the LigaSure.  Before the bowel  was cut, it should be noted that the bowel length was measured three times  for accuracy.  The 60 cm of the ileum at the most distal end was then opened  along its antimesenteric border, leaving a 10 cm piece that was cleansed for  the [employer] loop.  The bowel was reasonably well-prepared.  Residual greenish  material was washed away.  The bowel was then reconfigured into a rough W  shape and the back wall sutures were then performed in order to create a  plate that could be rolled up into a neo-bladder.  The suture lines were  then run until the three back walls were appropriately completed.  The front  side was then closed to begin to develop the pouch.  This was taken all the  way up to a point just below where the [place] loop started, leaving just  enough space to place a Foley catheter and stents after the anastomosis.  The ureters were prepared.  The left side was brought underneath the  mesentery on both sides.  A standard ureteral anastomosis was done using  single J stents.  The stents were hooked to the ureter with a 5-0 chromic.  The anastomosis was done in the usual fashion with 4-0 Vicryl sutures.  The  patient had a hematuria catheter placed out through the urethra.  A small  opening was placed in the bottom of the pouch.  The pouch was positioned so  that no suture lines were on top of the vagina.  The hematuria catheter was  then advanced up into the pouch and out through the small opening that was  still left in the pouch.  It was at this level that it was sutured to the  single J sutures.  The anastomosis was completed by placing the previously-  placed Vicryl sutures through the opening at the bottom of the neo-bladder,  and a good watertight closure was obtained.  The six sutures were closed  down with the appropriate degree of angle and then cut.  The residual   opening left within the neo-bladder was then closed with Vicryl.  The entire  pouch was inspected.  It appeared to be well-positioned.  There was no  tension on the mesentery.  The Studer loop sat nicely in the retroperitoneum  with both ureters attached.  The anastomosis appeared to be dry.  A few  small leaks were identified, and these were closed over with figure-of-eight  sutures of 2-0 Vicryl.  The patient had two #1 flat Jackson-Pratt drains  placed, one along the right gutter, which would come up toward the ureteral  anastomosis, the other across the top of the bladder and then down the left  gutter.  The NG tube was placed in appropriate position and was checked.  The patient was then closed with a running suture of #1 PDS.  The skin was  irrigated carefully with antibiotic solution.  It should be noted that the  gowns and gloves as well as instruments were changed prior to closure but after the bowel work had been completed.  The skin was closed with surgical  clips.  The patient tolerated the procedure well and was taken to the  recovery room in good condition.      RJE/MEDQ  D:  06/19/2004  T:  06/19/2004  Job:  [id number]

## 2010-10-30 NOTE — Op Note (Signed)
[name], [name]                ACCOUNT NO.:  [id number]   MEDICAL RECORD NO.:  [id number]          PATIENT TYPE:  AMB   LOCATION:  NESC                         FACILITY:  [facility]   PHYSICIAN:  Martina Sinner, MD DATE OF BIRTH:  09-03-1955   DATE OF PROCEDURE:  08/06/2005  DATE OF DISCHARGE:                                 OPERATIVE REPORT   PREOPERATIVE DIAGNOSIS:  Stress incontinence.   POSTOPERATIVE DIAGNOSIS:  Stress incontinence.   SURGERY:  Cystoscopy and transurethral injection of collagen.   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  She was to undergo collagen today.  She had  mild dysuria last night.  Clinically, I thought it was safe to proceed with  her surgery.  I did send her urine for analysis and culture.   DESCRIPTION OF PROCEDURE:  The patient was prepped and draped in the usual  fashion.  She was given IV gentamicin and Ancef.  When I first cystoscoped  her with the [facility] injection scope, the bowel mucosa looked completely  healthy.  She had good bladder capacity.  Surprisingly, her urethra was  little bit shorter than I thought it was and it was approximately 2.5 and  possibly 3 cm in length.  I injected 4 syringes of collagen at 5, 9 and 12  o'clock.  I was very happy with each injection.  I think because of her  previous surgery, her urethra does not normally coapt as well as a normal  woman's urethra; i.e., the collagen does not dissected as freely, but  overall the procedure went very well.  Hopefully, this will reach our  treatment goal.  There was no bleeding.  Her bladder was emptied with a 14-  French red rubber catheter at the end of the case.  I will give her 5 days  of ciprofloxacin and Vicodin postoperatively.  She will be followed as per  protocol.           ______________________________  Martina Sinner, MD  Electronically Signed     SAM/MEDQ  D:  08/06/2005  T:  08/07/2005  Job:  [id number]

## 2010-10-30 NOTE — Op Note (Signed)
NAME:  [name], [name]                          ACCOUNT NO.:  [id number]   MEDICAL RECORD NO.:  [id number]                   PATIENT TYPE:  OIB   LOCATION:  3034                                 FACILITY:  [facility]   PHYSICIAN:  Stefani Dama, M.D.               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   PROCEDURE:  Removal of posterior fixation hardware L5-S1 with Met-Rx and  fluoroscopic guidance.   SURGEON:  Stefani Dama, M.D.   ASSISTANT:  None.   ANESTHESIA:  General endotracheal.   INDICATIONS FOR PROCEDURE:  The patient is a 55 year old individual who has  had significant back pain that has been persisting for a number of months  now.  She had surgery about two years and this demonstrates that she has a  solid arthrodesis, however, she has had a persistent seroma around the  hardware in the posterior aspect of her back.  This had been tapped and  drained and she felt better temporarily, however, it has been suggested that  seroma may be responding to the hardware and this is what is causing it to  persist.  She is taken to the operating room now to undergo a surgical  removal of the hardware.   DESCRIPTION OF PROCEDURE:  The patient was brought to the OR supine on the  stretcher.  After a smooth induction of general endotracheal anesthesia, she  was turned prone and the back was shaved, prepped with Duraprep, and draped  in a sterile fashion.  Using fluoroscopic guidance, the area over the  hardware at L5-S1 was localized on the left and on the right.  After  infiltrating the skin with 1% lidocaine with epinephrine, a small vertical  incision was made over this area.  A K-wire was  passed through the superior  aspect of the hardware at L5.  Then by using a series of dilators, the L5  screw was localized on the left side first and the hardware was identified.  The caps were loosened on the left side first.  Then the rod was mobilized  after cauterizing between the two screw heads to allow loosening of the rod.  Once the rod was removed, assessing the central portion of the screw using  the spiral 90 D instrumentation was very difficult.  Controlling the head so  the screwdriver could be placed over the screw was only possible in a very  small window of movement.  This required manipulation and stabilization of  the tulip head over the screw and required some manipulation of the screw  head in order to obtain purchase of  the screw proper.  However, this was  completed on each screw individually and each screw was removed first on the  left side and then ultimately on the right side.  Once all the hardware was  removed, the wounds were inspected.  The seroma was found to be contacting  the left side of hardware.  It was drained and decompressed.  There was some  blood within the cavity of the seroma at this point.  The area was copiously  irrigated with antibiotic irrigating solution and then stitches were placed  in the fascia deep, these were 2-0 Vicryl sutures in an interrupted fashion.  Ultimately, the  subcutaneous tissue was closed with 2-0 Vicryl and the subcuticular tissue  was closed with 3-0 Vicryl on both sides.  Dermabond was placed on the skin.  The patient tolerated the procedure well and was returned to the recovery  room in stable condition.                                               Stefani Dama, M.D.    [name]  D:  08/02/2003  T:  08/02/2003  Job:  [id number]

## 2010-10-30 NOTE — Op Note (Signed)
[name], [name]                ACCOUNT NO.:  [id number]   MEDICAL RECORD NO.:  [id number]          PATIENT TYPE:  INP   LOCATION:  1402                         FACILITY:  [facility]   PHYSICIAN:  Martina Sinner, MD DATE OF BIRTH:  09-21-1955   DATE OF PROCEDURE:  DATE OF DISCHARGE:                                 OPERATIVE REPORT   PREOPERATIVE DIAGNOSES:  Fistula between orthotopic bladder and vagina.   POSTOPERATIVE DIAGNOSES:  Fistula between orthotopic bladder and vagina.   PROCEDURE:  Repair of fistula between the orthotopic bladder and vagina plus  interposition of martius flap.   SURGEON:  Martina Sinner, MD.   ASSISTANT:  Jamison Neighbor, M.D.   SECOND ASSISTANT:  Glade Nurse, M.D.   Ms. Bignell's history has been previously well documented. The plan was to  repair her fistula transvaginally.   The patient was prepped and draped in the usual fashion. Support of her  shoulders was utilized so she would not slide down the table. The table was  placed in a very high position as well as steep Trendelenburg. The fistula  was easily identified and extended from approximately 2 to 10 o'clock. Only  the dorsal roux strip was intact. One could see the Foley balloon through  the bladder neck.   I did not have to incise any vaginal stenotic scar which I thought I needed  to do when I examined her under anesthesia. I made a wide based inverted U  flap extending from the opening which is approximately 3 cm x 2 cm in size.  Exposure was excellent. She did have a narrow inferior vaginal wall. The  flap was developed for several centimeters cephalad. Approximately a 2 mm  possible opening in the bladder was made but it was not full thickness. This  was over sewn with 2 layers of 3-0 Vicryl at the end of the procedure. I  then circumscribed the large opening in the urethra and bladder neck. I  developed flaps laterally with the help of silk stay sutures. This steep  Trendelenburg helped do this and I was very pleased with the mobilization  circumferentially. I then mobilized circumferentially between 2 and 10  o'clock __________ pelvic sidewalls taking all the soft tissue and bladder  and urethra off any possible tension.   I then used seven interrupted 2-0 Vicryl to reapproximate the urethra and  bladder neck making sure that I got good closure at the two apices on the  left and right side. I was very happy with the closure. I tagged the two  apices and then ran a partial thickness running 2-0 Vicryl on an __________  as a second layer. I was very happy with the closure.   I then made a 7 or 8 cm incision overlying her right labia minora. She is  very thin. I dissected approximately 1 cm through subcutaneous tissue down  to the fat pad. I mobilized the martius flap or labial fat pad quite easily  keeping its blood supply inferior and laterally. The planes were easy to  find. I then made  a tunnel underneath the right vaginal mucosa joining the  two incisions and surgical spaces. It was the size of my index finger. On a  3-0 silk, I was able to pass the fat pad into the vagina. Using three 3-0  Vicryl, it was sewn in place over my area of closure. I then closed the  anterior vaginal wall flap with running 3-0 Vicryl. Copious irrigation was  utilized. A small 1/4 inch Penrose drain was inserted and sutured in place  into the labia minor dissection. A two layer closure with 3-0 Vicryl and 4-0  subcuticular Monocryl was utilized. The vaginal pack was inserted. The Foley  catheter was draining well at the end of the case. She will be covered  perioperative antibiotics and anticholinergics.   Total blood loss was less than 100 mL. Leg position was excellent at the end  of the case.   Hopefully this operation will prove curative.           ______________________________  Martina Sinner, MD  Electronically Signed     SAM/MEDQ  D:  03/11/2005   T:  03/11/2005  Job:  [id number]

## 2010-10-30 NOTE — Op Note (Signed)
[place]  Patient:    [name], [name]                       MRN: [id number] Proc. Date: 06/12/00 Adm. Date:  [id number] Attending:  Jonne Ply                           Operative Report  PREOPERATIVE DIAGNOSIS:  Spondylosis L5-S1, with left lumbar radiculopathy and back pain.  POSTOPERATIVE DIAGNOSIS:  Spondylosis L5-S1, with left lumbar radiculopathy and back pain.  PROCEDURE:  Removal of hardware from L4-L5, left lumbar hemilaminectomy, diskectomy, posterior interbody arthrodesis, with T-lift bone, stabilization with pedicular fixation at L5-S1, using surgical Dynamics spiral #90 fixation system, arthrodesis with local autograft, and allograft (Vitoss).  SURGEON:  Stefani Dama, M.D.  ASSISTANT:  Danae Orleans. Venetia Maxon, M.D.  ANESTHESIA:  General endotracheal.  INDICATIONS:  The patient is a 55 year old individual who has had significant back and increasing left leg pain.  She has had a previous history of an arthrodesis at L4-5.  She had a fall about six months ago, which caused the pain to come back.  The patient was advised regarding surgical decompression and stabilization.  DESCRIPTION OF PROCEDURE:  The patient was brought to the operating room supine on a stretcher.  After a smooth induction of general endotracheal anesthesia, she was turned prone.  Her back was shaved, prepped with DuraPrep, and draped in a sterile fashion.  An elliptical incision was made around the previous scar, and this was taken down to the lumbodorsal fascia, which was opened on either side of the midline, to expose the spinous processes of L4, L5, and the sacrum.  The L4-5 was noted to be quite solid, and did not induce any movement.  The hardware was identified, and the screw heads were loosened. The rods were removed.  Even with the screws in place, no movement could be induced into the L4-5 interspace, with the rods out.  It was felt that  the arthrodesis was indeed quite solid.  The screws were then all removed.  A hemilaminectomy was then performed on the left side at L5-S1.  The common dural tube was explored.  The S1 nerve root was identified.  This was retracted medially.  A diskectomy was completed on this side in a radical fashion, removing all of the disk fragments, all the way up to the anterior longitudinal ligament.  This area was then decorticated carefully on either end plate, using the Synthes tools to prepare the end plate for T-less grafting.  Once the interspace was completely evacuated and the cortical surfaces were prepared, trial sizers were placed of the T-less graft, and a 13.0 mm graft was felt to fit most appropriately with a distractor being placed on the opposite side.  The pedicle entry sites were then chosen at L5-S1.  The right-sided inferior facet of L5 was removed, so as to decorticate the facet joint completely, and the pedicle entry site was chosen through the facet of the superior portion of the sacrum.  The probes were checked radiographically.  The 40.0 mm x 5.75 mm variable angle screws of the surgical Dynamics spiral #90 system were used at S1.  At L5 the 45.0 mm screws x 5.75 mm in diameter were placed in the previously-created holes.  With distraction being applied, a 13.0 mm T-less spacer was placed in the anterior portion of the interspace at  L5-S1.  Behind this was then packed a combination of autograft that had been harvested from the hemilaminectomy, and facetectomies, mixed with a Vitoss bone expander.  This had been prepared by being mixed with blood first, and then this was packed into the interspace behind the allograft T-less spacer.  The lateral recesses were similarly packed, and then pedicle screws having been placed, were secured with a slight compression construct with a 90.0 mm rod that had been cut in half, and then contoured to fit between the screw heads.  Localizing  radiograph identified good position of the construct.  The posterior elements which had previously been decorticated, were packed with a combination of autograft and Vitoss allograft, and once completed the area was checked for hemostasis, and the lumbar dorsal fascia was closed with #1 Vicryl in an interrupted fashion, #2-0 Vicryl used in the subcutaneous tissues, and #3-0 Vicryl used subcuticularly.  The patient tolerated the procedure well and was returned to the recovery room in stable condition. DD:  06/12/00 TD:  06/12/00 Job: [id number]

## 2010-10-30 NOTE — Op Note (Signed)
[name], [name]                ACCOUNT NO.:  [id number]   MEDICAL RECORD NO.:  [id number]          PATIENT TYPE:  INP   LOCATION:  X001                         FACILITY:  [facility]   PHYSICIAN:  Jamison Neighbor, M.D.  DATE OF BIRTH:  17-Feb-1956   DATE OF PROCEDURE:  09/19/2004  DATE OF DISCHARGE:                                 OPERATIVE REPORT   PREOPERATIVE DIAGNOSIS:  Fistula between neobladder and vagina.   POSTOPERATIVE DIAGNOSIS:  Fistula between neobladder and vagina.   PROCEDURE:  Cystoscopy, transabdominal and transvaginal fistula repair.   SURGEON:  Jamison Neighbor, M.D.   ASSISTANTS:  [employer], M.D. and Dr. Vear Clock [facility] Resident).   ANESTHESIA:  General.   COMPLICATIONS:  None.   DRAINS:  1.  Foley catheter.  2.  A 20-French suprapubic tube.  3.  Jackson-Pratt drain.   HISTORY:  This 55 year old female had severe intractable pelvic pain with  associated urinary incontinence.  The patient had such a small capacity  bladder that she could not store urine and tended to be completely  incontinent due to high-pressure systems.  In addition, there was a concern  that she would begin to develop upper tract deterioration due to the small  capacity bladder.  Because nothing worked for the patient and she had such  intractable pain, the decision was made to proceed with cystectomy.  The  patient was offered an [place] pouch type of diversion with catheterization  through an umbilical stoma or an ileal conduit, but she elected to have a  neobladder preserving her urethra with the thought that she would probably  have to catheterize if unable to empty.  The patient underwent a successful  suspension and ileal neobladder and has had a nice response insofar as pain  is concerned.  Her pelvic pain has cleared, although we note that she still  has some pain unrelated to her bladder caused by chronic back problems.  She  developed the urinary  incontinence per vagina, however, and was found to  have a fistula.  The follow-up pouchograms showed  the fistula was not  getting smaller.  The patient is extremely frustrated by this and has now  agreed to undergo fistula repair.  She was told that we would attempt to do  this transvaginally;  however, since she has never had children, has a very  small tight introitus following her hysterectomy, it is thought to be  unlikely that she is going to be a candidate for a transvaginal repair.  The  patient understands the risks and benefits of the procedure which were  described to her as follows:  1.  There certainly is risk of injury to the  pouch itself, to adjacent structures including the vascular supply and GI  tract.  2.  The patient may require postoperative blood transfusions.  3.  The pouch could potentially be injured requiring alternative forms of  urinary diversion  4.  There is certainly no guarantee that the fistula  might not return.  The patient gave full and informed consent for the  procedure.   DESCRIPTION OF PROCEDURE:  After successful induction of general anesthesia,  the patient was placed in the dorsal lithotomy position, prepped with  Betadine, and draped in the usual sterile fashion.  PAS stockings were in  place.  The patient had received a bowel prep including Neomycin and  erythromycin base, and received appropriate preoperative antibiotics.  A  weighted vaginal speculum was placed, but because of the patient's small  introitus, it could not be inserted.  Different sizes were attempted, but  the patient's narrowed introitus following her hysterectomy made it  impossible to place the weighted speculum.  A Deaver was placed, and the  vagina was inspected.  The urethra was very well supported.  The fistula was  just back of the urethra and normally would be very accessible, but because  she had such a high elevated bladder neck, it really was not accessible   through a transvaginal approach.  The decision was made to proceed with a  transabdominal approach.  A Foley catheter was left in place in order to  guide entry into the bladder, and a second 14-French Foley was placed per  the fistula to identify it during the dissection.  The previous lower  midline incision was cut away because there was somewhat of a hypertrophic  scar.  Electrocautery was used to dissect down to the subcutaneous tissues  until the rectus sheath was identified.  The rectus sheath was opened in the  midline, and then the rectus was split.  Sharp dissection was used to  dissect the rectus sheath off the underlying pouch.  The pouch was opened in  the midline in order to identify the pouch.  The dissection proceeded all  the way down to the pelvic sidewalls and underneath the suprapubic area  nicely freeing up the neobladder.  The fistula could be seen in the very  deep depths of the wound and could not be easily accessed at first.  Decision was made to split the neobladder all the way down to the fistula  allowing it to be free off the underlying vagina and allow for a good  quality multilevel closure.  The incision was then carried down just to the  right of the urethra all the way down to the fistula splitting the  neobladder.  The vagina was elevated, and the neobladder was dissected off  the vaginal fistula.  The fistula was then closed in multiple layers.  The  vagina was closed in two layers.  There was not enough space or access to  the omentum to place a piece of omentum in that area, but it did appear that  the two vaginal layers had nice, watertight closures.  The mucosa was then  closed with two additional layers, all with 3-0 Vicryl.  Once that had been  closed, the remainder of the bladder was closed with one layer of  interrupted and one layer of running 2-0 Vicryl affording an excellent watertight closure.  Irrigation was performed, and one area down on  the  right-hand side closure was identified, and that was closed with a figure-of-  eight of Vicryl.  The patient had a suprapubic tube placed that was brought  out through an old Jackson-Pratt stab incision.  This was placed on  patient's right, and a Jackson-Pratt was placed on the left. During the  entire procedure, there was excellent urine output, and there was no  evidence of any injury to the Studer limb of the neobladder  indicating there  was no injury to the ureters.  The bladder itself appeared to be quite  viable with no compromise to the blood supply.  After the neobladder had  been closed, the patient had a J-P drain placed on the left.  The J-P and  the suprapubic tube were sewn in place.  The incision was closed with a  running suture of #1 PDS, and then the skin was closed with surgical  staples.  Vaginal inspection showed that there was nice closure. An  additional layer, in essence the fifth layer of closure, was then placed in  the vaginal mucosa to reinforce the closure.  The patient had packing  applied in order to ensure adequate hemostasis in that area.  The patient  was left with a Foley catheter in place, plus the suprapubic tube, both of  which were placed to straight drainage.  The patient tolerated the procedure  well and was taken to the recovery room in good condition.      RJE/MEDQ  D:  09/19/2004  T:  09/19/2004  Job:  [id number]

## 2010-10-30 NOTE — Op Note (Signed)
NAME:  [name], [name]                          ACCOUNT NO.:  [id number]   MEDICAL RECORD NO.:  [id number]                   PATIENT TYPE:  AMB   LOCATION:  NESC                                 FACILITY:  [facility]   PHYSICIAN:  Jamison Neighbor, M.D.               DATE OF BIRTH:  [birth year]   DATE OF PROCEDURE:  09/10/2002  DATE OF DISCHARGE:                                 OPERATIVE REPORT   SERVICE:  Urology.   PREOPERATIVE DIAGNOSES:  Interstitial cystitis.   POSTOPERATIVE DIAGNOSES:  Interstitial cystitis.   PROCEDURE:  Cystoscopy, urethral calibration, hydrodistention of the  bladder, Marcaine and Pyridium installation, Marcaine and Kenalog injection,  bladder biopsy.   SURGEON:  Jamison Neighbor, M.D.   ANESTHESIA:  General.   COMPLICATIONS:  None.   DRAINS:  None.   BRIEF HISTORY:  This 55 year old female has a complicated urologic history.  She has had problems with urinary incontinence but appears to contain a  component of both sensory and urge incontinence. She underwent urodynamics  which showed marked urinary frequency with early sensation. Based on her  physical examination as well as these findings we thought that she might  indeed have interstitial cystitis and for that reason she is undergoing  diagnostic evaluation. The patient understands the risks and benefits of the  procedure and full and informed consent was obtained.   DESCRIPTION OF PROCEDURE:  After successful induction of general anesthesia,  the patient was placed in the dorsal lithotomy position prepped with  Betadine and draped in the usual sterile fashion. A careful bimanual  examination revealed no abnormalities of the urethra. This was of particular  importance since there was a question of deviation of the urethra in the  office but no abnormalities were detected. The patient had the urethra  dilated to 35 [place]. There did appear to be some stenosis there and this  dilated up nicely without  bleeding. There did appear to be some rigidity to  the urethra. Cystoscopic and manual examination did not show a diverticulum,  however. The bladder was then inspected and was free of any tumor or stones.  Both ureteral orifices were normal in configuration and location. The  bladder was then dilated at a pressure of 100 cm of water for five minutes  and when the bladder was drained the bladder capacity was found to be only  400 mL. While normally this would be diagnostic for IC, the one thing that  was somewhat unusual is the fact that there were not a lot in the way of  glomerulations. This may be due at least in part to medical management since  the patient has been on [place]. The patient had a bladder biopsy, the  biopsy site was cauterized, this will be sent for a mast cell analysis. The  bladder was drained. The patient had Marcaine and Pyridium left within the  bladder Marcaine  and Kenalog were injected periurethrally. The patient  tolerated the procedure well and was taken to the recovery room in good  condition. She will continue on her present medications and she will be  given OxyIR for pain management on top of the methadone she is already  taking. She will also receive Pyridium plus and a short course of antibiotic  therapy.                                               Jamison Neighbor, M.D.    RJE/MEDQ  D:  09/10/2002  T:  09/11/2002  Job:  [id number]   cc:   Ernestina Penna, M.D.  [address] [place]  [place] [address]  Fax: [phone]

## 2010-10-30 NOTE — Op Note (Signed)
NAME:  [name], [name]                          ACCOUNT NO.:  [id number]   MEDICAL RECORD NO.:  [id number]                   PATIENT TYPE:  AMB   LOCATION:  DAY                                  FACILITY:  [facility]   PHYSICIAN:  Jamison Neighbor, M.D.               DATE OF BIRTH:  01/21/1956   DATE OF PROCEDURE:  08/12/2003  DATE OF DISCHARGE:                                 OPERATIVE REPORT   PREOPERATIVE DIAGNOSIS:  Urinary incontinence.   POSTOPERATIVE DIAGNOSIS:  Urinary incontinence.   OPERATION/PROCEDURE:  First stage InterStim implantation.   SURGEON:  Jamison Neighbor, M.D.   ANESTHESIA:  IV sedation with local.   COMPLICATIONS:  None.   DRAINS:  16-French Foley catheter, removed in the recovery room.   BRIEF HISTORY:  This is 55 year old female has uncontrolled urinary  incontinence felt to be secondary to interstitial cystitis.  The patient has  tried all forms of standard therapy including both instillation therapy and  oral therapy.  She has elected to undergo InterStim implant.  She  understands this is the first of a planned two-stage approach and that she  may not respond to stimulation.  She is aware of the fact that she may  require revision and/or removal of the device at a later date.  She gave  full and informed consent.  The patient received appropriate preoperative  antibiotics as well as a full 10-minutes scrub and paint in preparation for  the procedure.   DESCRIPTION OF PROCEDURE:  After successful induction of IV sedation, the  patient was placed in the prone position.  She was secured to the bed with  tape pulling the buttocks apart and allowing exposure of the rectal  sphincter.  The patient then received a full 10-minute scrub and paint with  Betadine and was draped out in the usual fashion including a Vi-Drape.  Fluoroscopy was used to identify the level of third sacral nerve.  Local  anesthesia was obtained with a combination of lidocaine and  Marcaine.  The  foramina was passed down into the third sacral foramen on each side.  This  was determined to be correct by fluoroscopy.  __________ of both needles  achieved the desired bellows effect on the external sphincter but we could  not see dorsiflexion of the great toe due to the patient's inability to stop  moving her feet.  Based on the angle seen on x-ray, the incision was made  for implant on the patient's right side.  A small incision was made and the  dilator system was used to dilate a tract down to foramen.  The needle was  then passed down through the foramen and the tines were deployed once  appropriate location was ascertained by fluoroscopy.  Three out of four  leads gave a good bellows effect at that location; namely, leads 1, 2 and 3.  A small incision was  made in the upper portion of the left buttocks and  tunneling tool was used to bring the quadripolar lead from the paramedian  incision to the lateral incision.  This was connected to a lead extender  which was then passed down to the right-hand side.  The incisions were  irrigated and then closed with Vicryl and 3-0 nylon.  The patient tolerated  the procedure well and was taken to the recovery room in good condition.   She will be sent home with Lorcet-Plus for breakthrough pain as well as  Keflex  and return to see me in one week.  She will be asked to keep a three-  day voiding diary prior to her return.                                               Jamison Neighbor, M.D.    RJE/MEDQ  D:  08/12/2003  T:  08/12/2003  Job:  [id number]

## 2010-10-30 NOTE — Op Note (Signed)
[name], [name]                ACCOUNT NO.:  [id number]   MEDICAL RECORD NO.:  [id number]          PATIENT TYPE:  AMB   LOCATION:  [facility]ESC                         FACILITY:  [facility]   PHYSICIAN:  Martina Sinner, MD DATE OF BIRTH:  12/13/1955   DATE OF PROCEDURE:  02/19/2005  DATE OF DISCHARGE:                                 OPERATIVE REPORT   PREOPERATIVE DIAGNOSIS:  Fistula between Studer pouch and vagina.   POSTOPERATIVE DIAGNOSIS:  Fistula between [employer] and vagina.   SURGERY:  Cystoscopy and removal of suprapubic catheter with examination  under anesthesia.   SURGEON:  Dr. Lorin Picket MacDiarmid   ASSISTANT:  Dr. Dorena Cookey had a Studer pouch for pelvic pain and frequency.  She had a  transabdominal and transvaginal repair performed by Dr. Logan Bores in April.  She  currently has incontinence.  Her suprapubic catheter is really not diverting  a lot of her urine.   The patient is prepped and draped usual fashion.  IV ciprofloxacin was given  prior to procedure.  On inspection of the vagina, she had a mid vaginal ring  probably from previous prolapse surgery.  I tried 3 different length  speculums.  In my opinion, she will need a gentle incision at 5 and 7  o'clock to open up the vagina to allow adequate exposure for her future  repair.  Her vaginal length was otherwise good.  She had a fairly narrow  introitus, but she is very small.   She is very thin.  She had minimal fatty tissue palpable and visible in the  area of the labia minora.  She may need bilateral Martius flaps, depending  upon the intraoperative findings.  She will be counseled accordingly.   I cystoscoped the patient.  It is difficult to know how large the pouch is  since it leaks.  I did visualize the suprapubic Foley balloon.  There was  minimal mucus.  I could scope through the fistula.  Using countertraction  with a hemostat and the cysto obturator, I was able to identify the defect.  The  defect was at the junction of the [employer] and urethra.  It extends  from approximately 2 o'clock to 10 o'clock, but I believe visually and  cystoscopically there is continuity at 12 o'clock.  The tissues were not  inflamed, and they were soft and supple.   The cystoscopy of the urethra demonstrated 2.5-3 cm, healthy-looking, supple  urethra that appeared to be coapting.  Of course it is difficult to say  truly if it is functioning well.  She denies stress incontinence prior to  her surgery but, of course, theoretically it could have been denervated.   I removed the suprapubic catheter easily and applied a dressing.  There was  no bleeding.   I drew [name] a picture and discussed the surgery with her and her family.  I have recommended a transvaginal closure of the fistula in combination with  a Martius or 2 Martius flaps.  She will also need an incision of the vaginal  ring  to insert my retractor.  I think the overall success rate is  reasonable, but I am concerned about how far the defect extends towards 12  o'clock on both sides.  I am also concerned about the healthiness of her  Martius flaps.  I think the overall success rate is approximately 75-80%.  I  think I can preserve vaginal length.  I think I can get reasonable exposure.  She understands the risk of ureter, bowel, vaginal, bladder, or nerve injury  leading to disability or reoperation.  She understands the risk of failure.  She was told that the true pouch capacity is not known at this point in  time.  There is no reason why it should not give her reasonable storage  capacity.  I cannot comment on whether or not she will have stress  incontinence afterwards if indeed we can close the fistula.  She is at risk  of vaginal narrowing, dyspareunia, and blocked intercourse which could be  related to the surgery or from the vaginal ring that I am going to incise.           ______________________________  Martina Sinner, MD  Electronically Signed     SAM/MEDQ  D:  02/19/2005  T:  02/19/2005  Job:  [id number]

## 2010-10-30 NOTE — Op Note (Signed)
NAME:  [name], [name]                          ACCOUNT NO.:  [id number]   MEDICAL RECORD NO.:  [id number]                   PATIENT TYPE:  AMB   LOCATION:  [facility]                                 FACILITY:  [facility]   PHYSICIAN:  Jamison Neighbor, M.D.               DATE OF BIRTH:  27-Jan-1956   DATE OF PROCEDURE:  08/26/2003  DATE OF DISCHARGE:                                 OPERATIVE REPORT   SERVICE:  Urology.   PREOPERATIVE DIAGNOSES:  Urgency incontinence status post first stage  Interstim.   POSTOPERATIVE DIAGNOSES:  Urgency incontinence status post first stage  Interstim.   PROCEDURE:  Replacement of Interstim lead.   SURGEON:  Jamison Neighbor, M.D.   ANESTHESIA:  Local with sedation.   COMPLICATIONS:  None.   DRAINS:  None.   BRIEF HISTORY:  This 55 year old female with severe intractable urinary  incontinence. The patient underwent first stage Interstim implantation two  weeks ago. At her first visit, her voiding chart revealed a significant  discrepancy in the results between the first two days of therapy and then  the subsequent days. The patient had dropped from 26 voids a day down to 10  on day 1 and 7 on day 2 and actually had returned to the same voiding  pattern.  An x-ray taken at that time showed no evidence of any lead  migration as the lead appeared to be appropriately positioned. The patient  had a change in her lead stimulator without any change in her results.   We had a long discussion with the patient concerning the best options. She  was given the option of removing the device or trying a new first stage.  Since the patient essentially has no other options available to her for the  management of her urgency incontinence short of substitution cystoplasty or  augmentation cystoplasty, she would like to take another attempt at first  stage Interstim. I told her we would be happy to do that but if she did not  get an adequate response that I would feel  strongly that her best option  would be to remove the leads and wait for some other form of neuromodulation  therapy to become available such as bion or some other pudendal nerve  stimulator. The patient understands the risks and benefits of the procedure  and gave informed consent.   DESCRIPTION OF PROCEDURE:  After successful induction of adequate IV  sedation, the patient was placed in a prone position, the buttocks were  pulled apart with tape in order to expose the anal sphincter. The patient  was given a full 10 minute scrub and paint and then was draped including a  vidrape. The sutures from the previous first stage were removed.  Fluoroscopy demonstrated the S3 level very nicely. The lead appeared to be  in good position with a gentle curve going off to the side and  definitely at  the S3 level. This was placed on the patient's right so we decided to place  a lead on the patient's left. The guide needle was then passed down and  manipulated so that it came in at that appropriate angle through the third  sacral foramen.  Stimulation showed an excellent bellows effect but very  little dorsiflexion of the toe.  Since there was a good Fluoro picture and  good motor responses, it was felt this was a good position for the lead.  A  wire was passed down, a small incision was made and the trocar was then used  to dilate down through the foramen. The quadripolar lead was passed down and  was positioned so that the bottom of the sacrum went between leads 1 and 2.  With stimulation, there was an excellent bellows effect on leads 1, 2 and 3  but not much on lead 0.  The old incision from the connection site between  the original quadripolar lead and the external lead extender was opened and  the old connection was elevated. This was cut off to allow the external  extender to be removed. The quadripolar lead was, however, left in place  with the actual connection in place so that if in the  future the decision is  made to use bilateral stimulation that lead is still available. The new  connection was tunneled over to that small incision and the __________ was  made in the usual fashion. The patient had a small skin opening made once  again on the right hand side but somewhat inferior to the last time for  preparation for the external lead. After the protected booty had been tied  down, the components were placed back in the small incision and that area  was irrigated and was closed with Vicryl and 3-0 nylon sutures. The patient  tolerated the procedure well and was taken to the recovery room in good  condition.  She will come into the office for followup and if not happy with  this second stimulator we will make the decision to remove the devices  completely.                                               Jamison Neighbor, M.D.    RJE/MEDQ  D:  08/26/2003  T:  08/26/2003  Job:  [id number]   cc:   Ernestina Penna, M.D.  [address] [place]  [place] [address]  Fax: [phone]   Madaline Savage, M.D.  [phone] [address]  [place]  [place] [address]  Fax: [phone]

## 2010-10-30 NOTE — H&P (Signed)
[facility]  Patient:    [name], [name] Visit Number: [id number] MRN: [id number]          Service Type: PMG Location: [facility] Attending Physician:  Thyra Breed Dictated by:   Thyra Breed, M.D. Adm. Date:  01/30/2001   CC:         Stefani Dama, M.D.  Monica Becton, M.D., [facility]   History and Physical  NEW PATIENT EVALUATION  HISTORY OF PRESENT ILLNESS:  [name] is a 55 year old who is sent to [place] by Dr. Stefani Dama for evaluation and management of her chronic back pain syndrome.  The patient states that she was in her usual state of health up until June of 1999.  Approximately one week after vaginal hysterectomy by Dr. Jonny Ruiz T. Soper, the patient developed lower back discomfort.  She was sent by Dr. Cira Servant nurse practitioner, Telford Nab, to Dr. Danielle Dess, who evaluated her and advocated conservative management initially with physiotherapy.  The patient did not improve.  She was known to have underlying degenerative disk disease from her previous CT scan.  She later followed up with an orthopedic surgeon who referred her to the [facility] radiology group for steroid injections x 3 which were not helpful.  She eventually returned back to Dr. Danielle Dess in November of 2000, at which time she underwent a Ray cage placement at L4-5.  She did quite well up until January, when she tripped over an ottoman and had pain that radiated along the posterior aspect of her left lower extremity down into her foot.  She underwent an L5-S1 fusion with pedicle screws.  It helped her radicular pain but did nothing for her low back discomfort.  She went back through physiotherapy in April of 2002 and has not done well.  She has been tried on Naprosyn, Lodine and Vioxx in the form of nonsteroidal anti-inflammatory agents, none of which were helpful.  She has been tried with Robaxin and Flexeril with regard to muscle relaxants without much  benefit.  She has taken Valium which has helped.  She has been tried on Darvocet-N, which was not helpful; Percocet, which was helpful but profoundly nauseated her; Vicodin, which made her hyper, and eventually she was placed on Demerol, which she is currently on, which she says takes the edge off; she also takes Ativan.  She describes her pain as predominantly a dull discomfort localized to the lumbosacral region which is increased with activity and improved with the Demerol.  She denies numbness or tingling, bowel or bladder incontinence or weakness except with overactivity.  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  She also has some underlying osteophytosis.  She is sent today for evaluation and discussion for pain management.  The patient has noted following her myelogram that she had a headache and she has had persistent dizziness and a mild occipital-to-frontal headache since then.  Patient is quite frustrated and grieving over the limitations that have occurred since her surgery.  She states that she has poor patterns of sleep and she was getting ready to start a business when she was limited by her back discomfort.  She has been basically on work release since two years ago.  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  CURRENT MEDICATIONS:  Estraderm, Ativan, meperidine with Phenergan and Paxil.  ALLERGIES:  No known drug allergies.  FAMILY HISTORY:  Positive for hypertension.  ACTIVE MEDICAL PROBLEMS:  Active medical problems include what sounds like a post-dural-puncture headache, which is mild, with associated dizziness; additionally, she has had a history of renal calculi.  PAST SURGICAL HISTORY:  Significant for hysterectomy, right elbow  surgery for transposition of the ulnar nerve with redo following this up and her back surgery.  SOCIAL HISTORY:  The patient is a pack-to-pack-and-a-half-per-day smoker.  She rarely drinks alcohol.  She is on medical leave through Dr. Danielle Dess from [employer] and her sister is running the nursery.  REVIEW OF SYSTEMS:  GENERAL:  Significant for night sweats, fevers and chills which she relates since her myelogram.  HEAD:  Significant for headaches, as mentioned above, and "feeling funny."  EYES:  Negative.  NOSE, MOUTH AND THROAT:  Negative.  EARS:  Negative.  PULMONARY:  Negative.  CARDIOVASCULAR: Negative.  GI:  Significant for constipation.  Liver negative.  GU:  Renal calculi.  MUSCULOSKELETAL AND NEUROLOGIC:  See HPI.  CUTANEOUS:  Negative. HEMATOLOGIC:  Negative.  ENDOCRINE:  Negative.  PSYCHIATRIC:  See HPI. ALLERGY/IMMUNOLOGIC:  Negative.  PHYSICAL EXAMINATION:  VITAL SIGNS:  Blood pressure is 129/70; heart rate is 94; respiratory rate is 18; O2 saturation is 100%; pain level is 7.5/10.  GENERAL:  This is a pleasant female in no acute distress.  HEENT:  Head was normocephalic, atraumatic.  Eyes:  Extraocular movements intact with conjunctivae and sclerae clear.  Nose:  Patent nares without discharge.  Oropharynx is free of lesions.  NECK:  Supple without lymphadenopathy.  Carotids are 2+ and symmetric without bruits.  LUNGS:  Clear to auscultation and percussion.  BREASTS:  Not performed.  ABDOMEN:  Not performed.  PELVIC:  Not performed.  RECTAL:  Not performed.  BACK:  Exam revealed well-healed surgical scar, with negative straight leg raise signs.  There was minimally increased pain on hyperextension, especially on the right side, with no increased pain on forward flexion.  EXTREMITIES:  No cyanosis, clubbing nor edema, with well-healed surgical scar over the right elbow.  Radial pulses and dorsalis pedis pulses were 2+ and  symmetric.  She had mild bony  enlargement of the first MTPs bilaterally.  NEUROLOGIC:  The patient was oriented to person, place, time and reason for visit.  Cranial nerves II-XII were grossly intact.  Deep tendon reflexes were symmetric in the upper and lower extremities with downgoing toes.  Motor was symmetric bulk and tone with 5/5 strength.  Sensory was intact to vibratory sense and pinprick.  Coordination was grossly intact.  IMPRESSION: 1. 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 2. Other medical problems per Dr. Monica Becton which include history of    renal calculi and bereavement secondary to limitations.  DISPOSITION: 1. I discussed with the patient treatment options.  These options would    include medical management and pain-coping skills.  I do not feel that    further injection therapy would likely be of much benefit.  She has been    tried on nonsteroidal anti-inflammatory agents, muscle relaxants and to an    extent on benzodiazepines.  She has had a partial response to the    benzodiazepines.  With regard to opiates, she has had difficulties with  nausea or hyperactivity.  I advocated that we try to get her on a    longer-acting opiate anyway to see if we could get better results.  After    discussions with her, she is interested in seeing our psychologist and we    will work on getting her set up with Dr. Jerrye Beavers with regard to    this.  With regard to medical management, I advised her that I would go    ahead and start her on Duragesic 25 mcg one applied every three days and we    will be happy to give her some Phenergan if needed for nausea.  If she does    not tolerate Duragesic, then we will need to consider placing her on    Neurontin.  She has less of a radicular quality to her pain so I am not    certain how well she will respond to this. 2. I plan to see her back in  followup in four weeks.  She is to stop her    Demerol for the time-being. Dictated by:   Thyra Breed, M.D. Attending Physician:  Thyra Breed DD:  02/02/01 TD:  02/03/01 Job: [id number]

## 2010-10-30 NOTE — Consult Note (Signed)
[facility]  Patient:    [name], [name]                       MRN: [id number] Proc. Date: 11/25/00 Adm. Date:  [id number] Attending:  Jeannette Corpus CC:         [facility], [address], [place], [place] [address]             Telford Nab, R.N.   Consultation Report  HISTORY OF PRESENT ILLNESS:  [age]-year-old white female returns for well-woman examination.  She is thought to be menopausal, taking Estraderm patch.  She has previously had a vaginal hysterectomy for menorrhagia, pelvic pain and uterine fibroids.  Her ovaries were left in place at that time.  At last visit, the patient was switched from Premarin to Estraderm patch.  She reports she has continued to have hot flushes and now is using two Estraderm patch, 0.1 mg each.  She continues to have hot flushes and some night sweats. She denies any pelvic pain or pressure or vaginal bleeding.  She does have occasional white vaginal discharge.  PAST MEDICAL HISTORY:  The patients biggest problem is that of lumbar pain; she has had two back operations and is still having difficulties.  She is scheduled to have a myelogram in the near future.  REVIEW OF SYSTEMS:  Negative except as noted above.  PHYSICAL EXAMINATION:  VITAL SIGNS:  Weight 125 pounds.  Blood pressure 118/78.  GENERAL:  The patient is a healthy, slender white female in no acute distress.  HEENT:  Negative.  NECK:  Supple without thyromegaly.  NODES:  There is no supraclavicular or inguinal adenopathy.  ABDOMEN:  Soft, nontender.  No mass, organomegaly, ascites or herniae are noted.  PELVIC:  EGBUS normal.  Vagina is clean and well-supported.  Cuff is well-healed and no lesions are noted.  Bimanual reveals no masses, induration or nodularity.  Rectovaginal exam confirms.  IMPRESSION:  Normal well-woman exam, status post hysterectomy.  Menopause with persistent  symptoms.  I am reluctant to increase the patients estrogen dose and would therefore recommend that we try to use Effexor 50 mg q.h.s. to see if this will ameliorate her hot flushes.  She will continue to use the Estraderm patch. She is given a prescription for two months of Effexor and she will contact us if she is having continued problems, otherwise, she will continue on Effexor. It should be noted that she is no longer taking Paxil.  Pap smears are obtained.  Patient will return to see [place] in one year for continued followup. DD:  11/25/00 TD:  11/26/00 Job: [id number]

## 2011-03-16 IMAGING — US US BIOPSY
1 series · 13 of 13 positions shown · non-contrast
Comparison: none

Clinical: Elevated liver function tests; request is made for random
core liver biopsy.

ULTRASOUND GUIDED RANDOM CORE LIVER BIOPSY
Sedation:  6 mg IV Versed
Total Moderate Sedation Time:  30 minutes.
An ultrasound guided liver biopsy was thoroughly discussed with the
patient and questions were answered.  The benefits, risks,
alternatives, and complications were also discussed.  The patient
understands and wishes to proceed with the procedure.  Written
consent was obtained.
Ultrasound of the liver was performed and an appropriate skin entry
site was determined.  Skin site was marked, prepped with Betadine,
and draped in the usual sterile fashion.  Local anesthesia was
provided with 1% Lidocaine.
A 17 gauge trocar needle was advanced under ultrasound guidance
into the liver (right hepatic lobe).  A total of 3 coaxial 18 gauge
core samples were then obtained through the guide needle. The guide
needle was removed. Post procedure scans were obtained.
Complications:  none

[Series 1: us biopsy · 0.26mm/px · 13 of 13 slices shown]
[im 1/13]
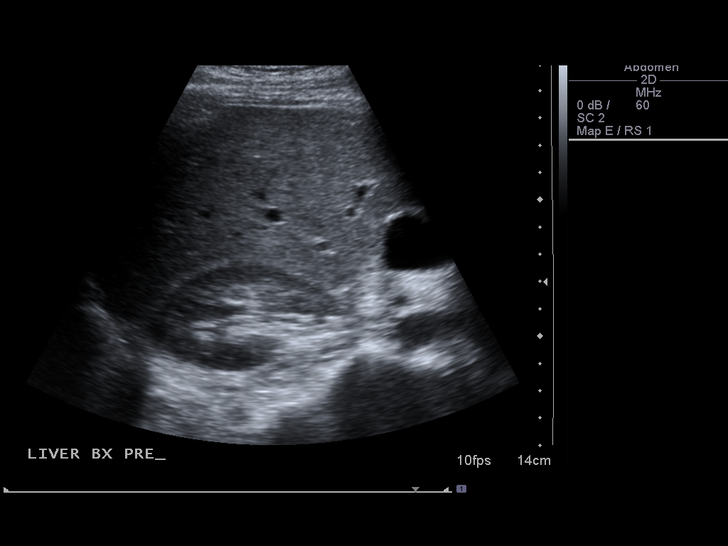
[im 2/13]
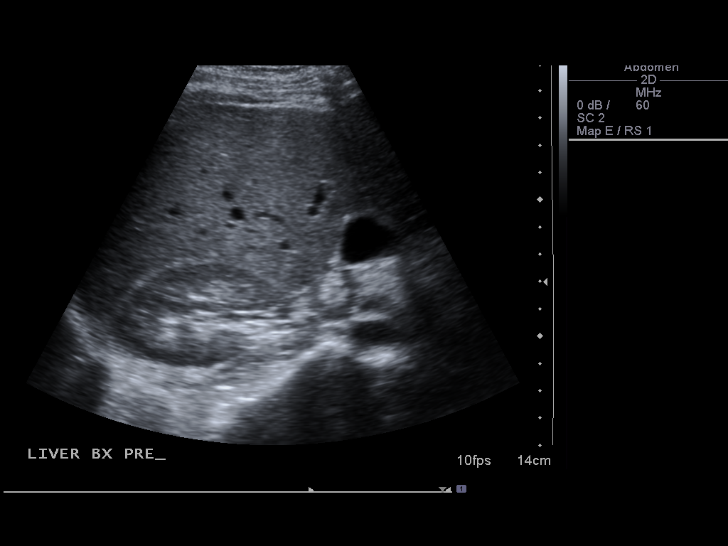
[im 3/13]
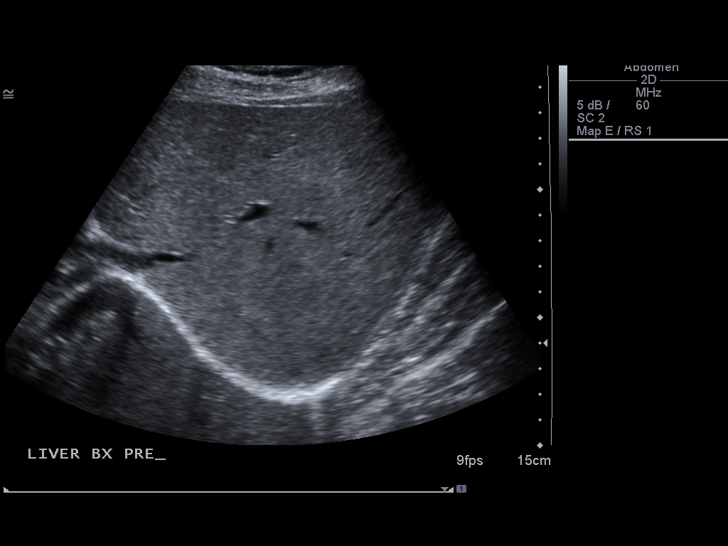
[im 4/13]
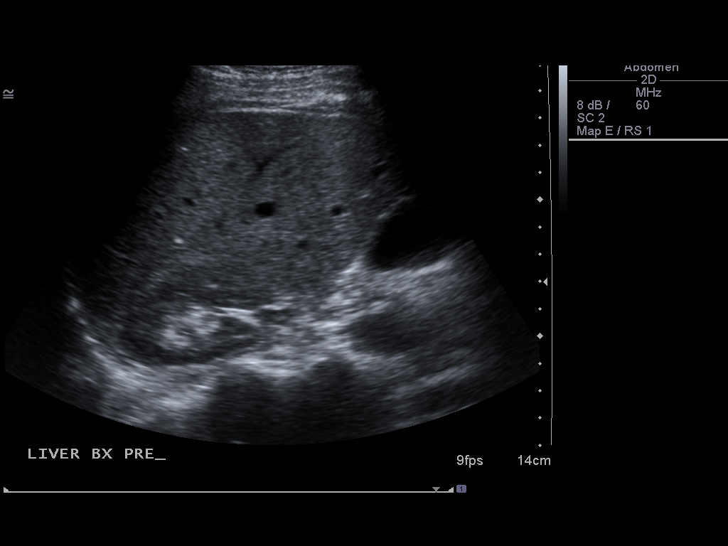
[im 5/13]
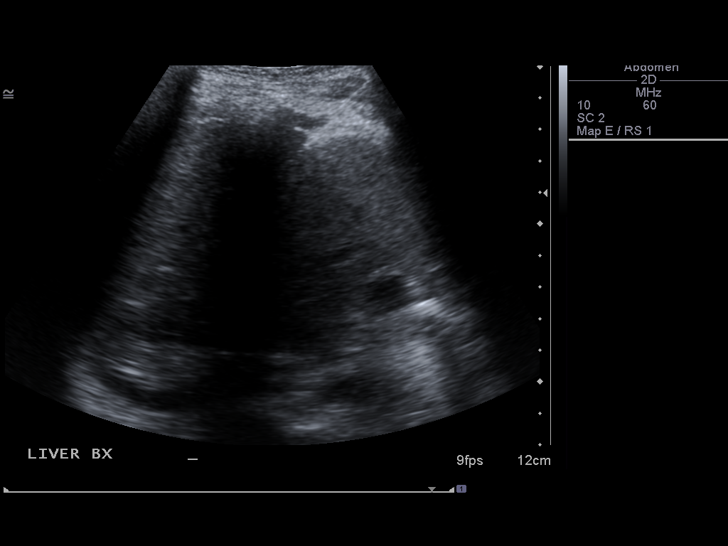
[im 6/13]
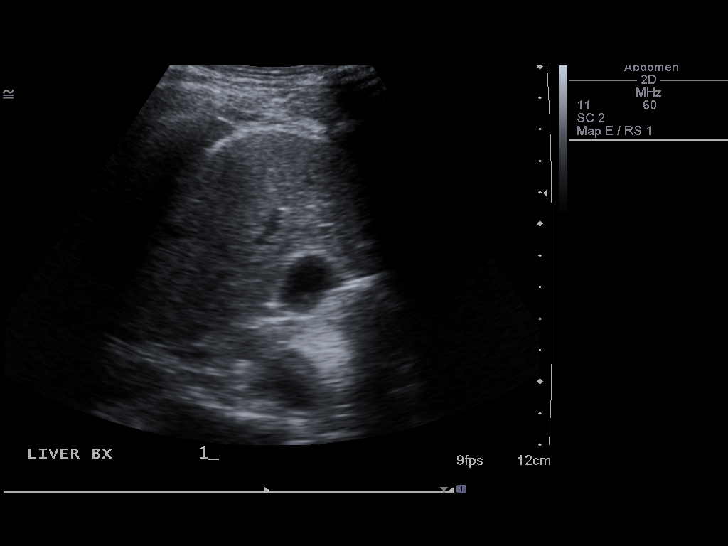
[im 7/13]
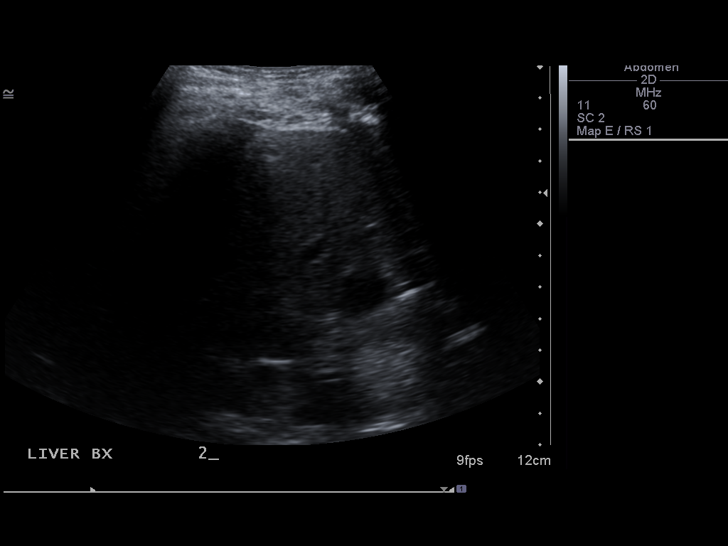
[im 8/13]
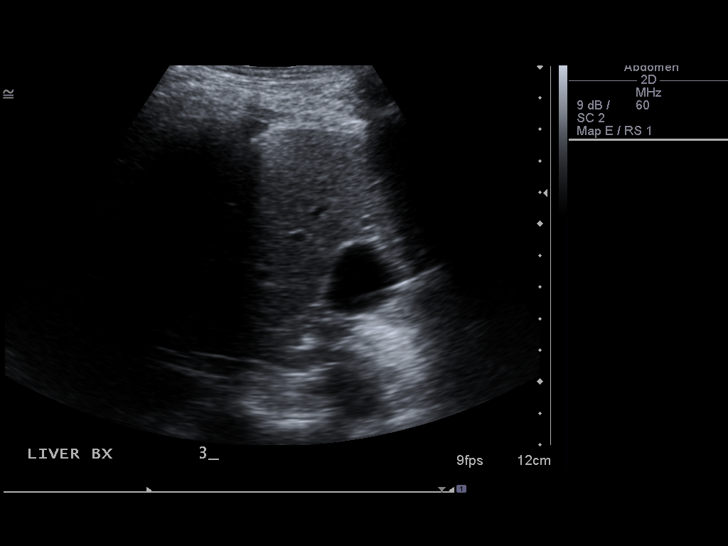
[im 9/13]
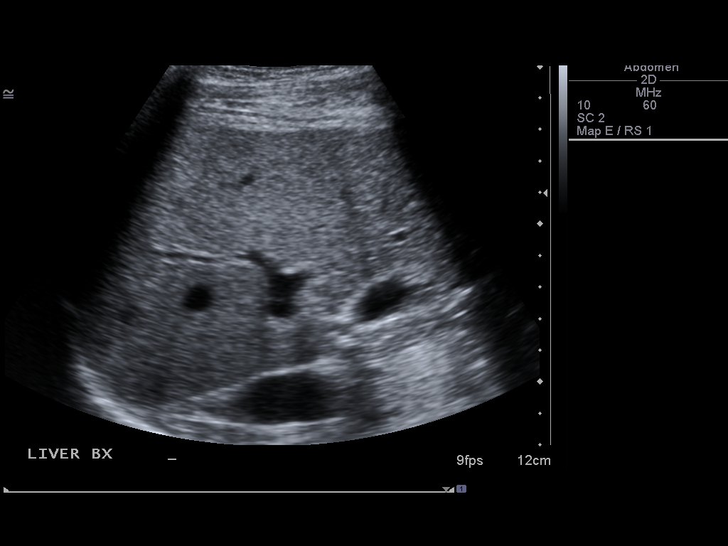
[im 10/13]
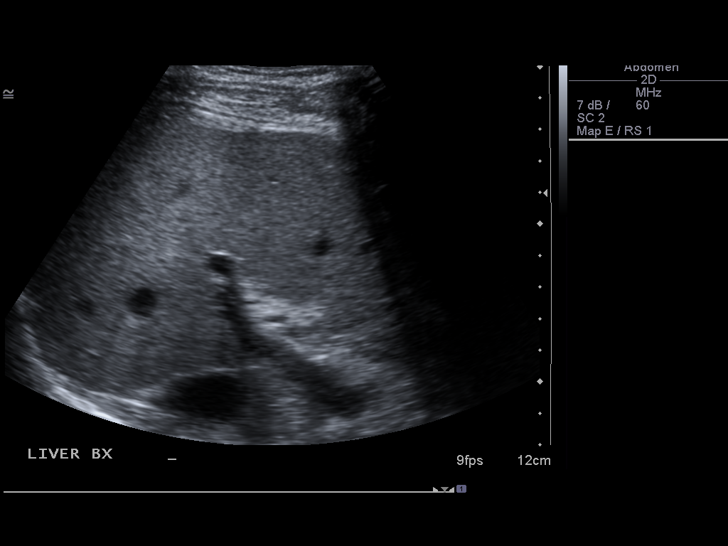
[im 11/13]
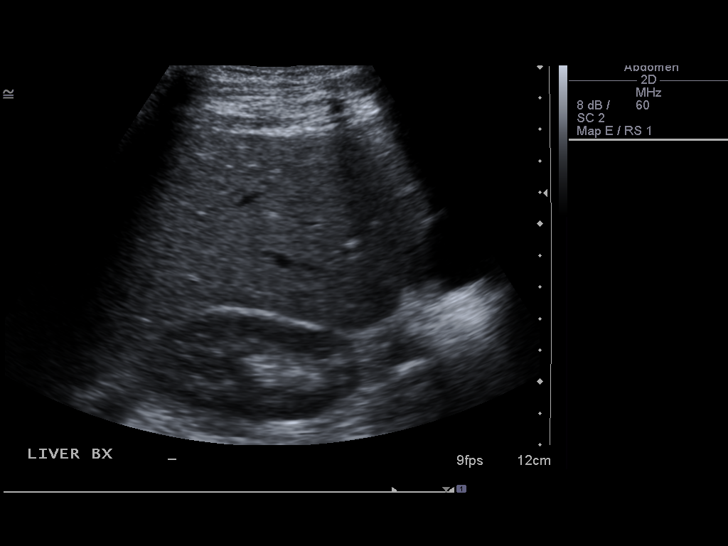
[im 12/13]
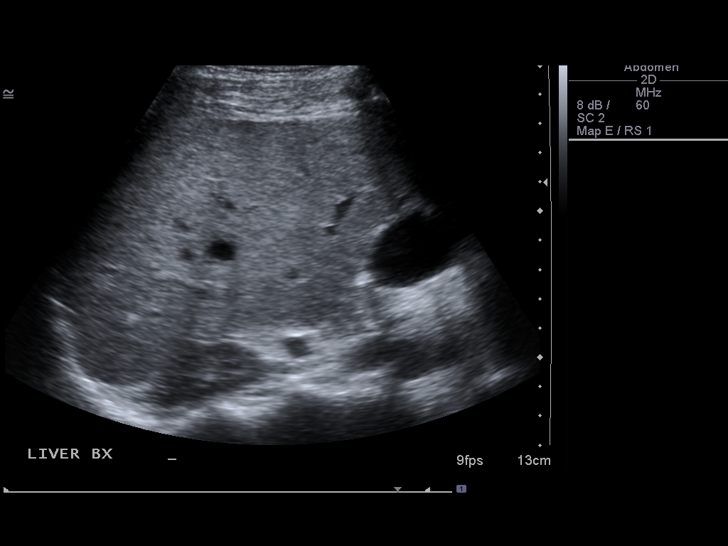
[im 13/13]
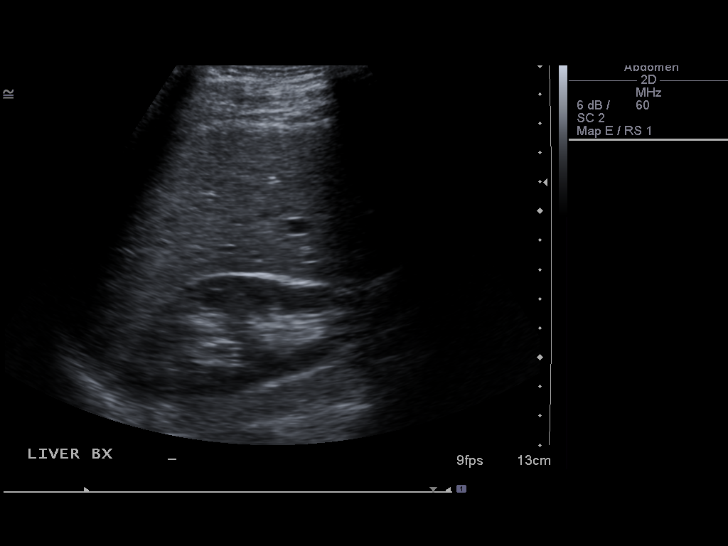

[13 of 13 positions shown; findings below may reference images not displayed]

IMPRESSION: Successful ultrasound guided random core biopsy of the liver. Final
pathology pending.

Read by: Popp, Solimar.-SAPARILLA

## 2011-03-18 LAB — CBC
HCT: 37.6
Hemoglobin: 13.1
MCHC: 34.9
MCV: 91
Platelets: 201
RBC: 4.14
RDW: 13.9
WBC: 10

## 2011-03-18 LAB — DIFFERENTIAL
Basophils Absolute: 0
Basophils Relative: 0
Eosinophils Absolute: 0.2
Eosinophils Relative: 2
Lymphocytes Relative: 21
Lymphs Abs: 2.1
Monocytes Absolute: 0.7
Monocytes Relative: 7
Neutro Abs: 7.1
Neutrophils Relative %: 71

## 2011-03-18 LAB — URINALYSIS, ROUTINE W REFLEX MICROSCOPIC
Leukocytes, UA: NEGATIVE
Protein, ur: NEGATIVE
Urobilinogen, UA: 0.2

## 2011-03-18 LAB — URINE MICROSCOPIC-ADD ON

## 2011-03-18 LAB — BASIC METABOLIC PANEL
BUN: 2 — ABNORMAL LOW
CO2: 29
Calcium: 9.2
Chloride: 98
Creatinine, Ser: 0.81
GFR calc Af Amer: >60
GFR calc non Af Amer: >60
Glucose, Bld: 101 — ABNORMAL HIGH
Potassium: 3.6
Sodium: 139

## 2011-03-18 LAB — LIPASE, BLOOD: Lipase: 14

## 2011-04-15 ENCOUNTER — Other Ambulatory Visit: Payer: Self-pay | Admitting: Obstetrics & Gynecology

## 2011-04-15 DIAGNOSIS — Z1231 Encounter for screening mammogram for malignant neoplasm of breast: Secondary | ICD-10-CM

## 2011-04-15 DIAGNOSIS — N83209 Unspecified ovarian cyst, unspecified side: Secondary | ICD-10-CM

## 2011-04-17 ENCOUNTER — Ambulatory Visit (HOSPITAL_COMMUNITY)
Admission: RE | Admit: 2011-04-17 | Discharge: 2011-04-17 | Payer: Self-pay | Source: Ambulatory Visit | Attending: Obstetrics & Gynecology | Admitting: Obstetrics & Gynecology

## 2011-04-29 ENCOUNTER — Other Ambulatory Visit: Payer: Self-pay | Admitting: Obstetrics & Gynecology

## 2011-05-15 ENCOUNTER — Ambulatory Visit (HOSPITAL_COMMUNITY): Payer: Self-pay

## 2011-08-29 ENCOUNTER — Ambulatory Visit: Payer: Self-pay | Admitting: Gastroenterology

## 2011-09-05 ENCOUNTER — Ambulatory Visit: Payer: Self-pay | Admitting: Gastroenterology

## 2011-10-03 ENCOUNTER — Ambulatory Visit: Payer: Self-pay | Admitting: Gastroenterology

## 2012-03-03 ENCOUNTER — Other Ambulatory Visit: Payer: Self-pay

## 2013-08-16 ENCOUNTER — Emergency Department (HOSPITAL_COMMUNITY): Payer: 59

## 2013-08-16 ENCOUNTER — Inpatient Hospital Stay (HOSPITAL_COMMUNITY)
Admission: EM | Admit: 2013-08-16 | Discharge: 2013-08-18 | DRG: 189 | Disposition: A | Discharge status: Home / Self Care | Payer: 59 | Attending: Internal Medicine | Admitting: Internal Medicine

## 2013-08-16 ENCOUNTER — Encounter (HOSPITAL_COMMUNITY): Payer: Self-pay | Admitting: Emergency Medicine

## 2013-08-16 DIAGNOSIS — J449 Chronic obstructive pulmonary disease, unspecified: Secondary | ICD-10-CM | POA: Diagnosis present

## 2013-08-16 DIAGNOSIS — J9600 Acute respiratory failure, unspecified whether with hypoxia or hypercapnia: Principal | ICD-10-CM | POA: Diagnosis present

## 2013-08-16 DIAGNOSIS — F419 Anxiety disorder, unspecified: Secondary | ICD-10-CM

## 2013-08-16 DIAGNOSIS — R0902 Hypoxemia: Secondary | ICD-10-CM

## 2013-08-16 DIAGNOSIS — J441 Chronic obstructive pulmonary disease with (acute) exacerbation: Secondary | ICD-10-CM | POA: Diagnosis present

## 2013-08-16 DIAGNOSIS — E876 Hypokalemia: Secondary | ICD-10-CM | POA: Diagnosis present

## 2013-08-16 DIAGNOSIS — E860 Dehydration: Secondary | ICD-10-CM | POA: Diagnosis present

## 2013-08-16 DIAGNOSIS — F17200 Nicotine dependence, unspecified, uncomplicated: Secondary | ICD-10-CM | POA: Diagnosis present

## 2013-08-16 DIAGNOSIS — K219 Gastro-esophageal reflux disease without esophagitis: Secondary | ICD-10-CM | POA: Diagnosis present

## 2013-08-16 DIAGNOSIS — G8929 Other chronic pain: Secondary | ICD-10-CM

## 2013-08-16 DIAGNOSIS — J189 Pneumonia, unspecified organism: Secondary | ICD-10-CM

## 2013-08-16 DIAGNOSIS — M549 Dorsalgia, unspecified: Secondary | ICD-10-CM | POA: Diagnosis present

## 2013-08-16 HISTORY — DX: Dorsalgia, unspecified: M54.9

## 2013-08-16 HISTORY — DX: Other chronic pain: G89.29

## 2013-08-16 LAB — BASIC METABOLIC PANEL
BUN: 10 mg/dL (ref 6–23)
CALCIUM: 9.1 mg/dL (ref 8.4–10.5)
CO2: 27 mEq/L (ref 19–32)
Chloride: 94 mEq/L — ABNORMAL LOW (ref 96–112)
Creatinine, Ser: 0.57 mg/dL (ref 0.50–1.10)
GLUCOSE: 132 mg/dL — AB (ref 70–99)
POTASSIUM: 2.8 meq/L — AB (ref 3.7–5.3)
SODIUM: 138 meq/L (ref 137–147)

## 2013-08-16 LAB — CBC WITH DIFFERENTIAL/PLATELET
BASOS ABS: 0 10*3/uL (ref 0.0–0.1)
BASOS PCT: 0 % (ref 0–1)
EOS ABS: 0 10*3/uL (ref 0.0–0.7)
EOS PCT: 0 % (ref 0–5)
HCT: 36 % (ref 36.0–46.0)
Hemoglobin: 12.4 g/dL (ref 12.0–15.0)
Lymphocytes Relative: 7 % — ABNORMAL LOW (ref 12–46)
Lymphs Abs: 0.7 10*3/uL (ref 0.7–4.0)
MCH: 32 pg (ref 26.0–34.0)
MCHC: 34.4 g/dL (ref 30.0–36.0)
MCV: 93 fL (ref 78.0–100.0)
Monocytes Absolute: 0.4 10*3/uL (ref 0.1–1.0)
Monocytes Relative: 3 % (ref 3–12)
NEUTROS PCT: 90 % — AB (ref 43–77)
Neutro Abs: 9.4 10*3/uL — ABNORMAL HIGH (ref 1.7–7.7)
PLATELETS: 121 10*3/uL — AB (ref 150–400)
RBC: 3.87 MIL/uL (ref 3.87–5.11)
RDW: 13.3 % (ref 11.5–15.5)
WBC: 10.5 10*3/uL (ref 4.0–10.5)

## 2013-08-16 LAB — MRSA PCR SCREENING: MRSA by PCR: NEGATIVE

## 2013-08-16 LAB — CREATININE, SERUM
Creatinine, Ser: 0.63 mg/dL (ref 0.50–1.10)
GFR calc Af Amer: >90 mL/min (ref >90)

## 2013-08-16 LAB — EXPECTORATED SPUTUM ASSESSMENT W GRAM STAIN, RFLX TO RESP C: Special Requests: NORMAL

## 2013-08-16 LAB — MAGNESIUM: MAGNESIUM: 1.8 mg/dL (ref 1.5–2.5)

## 2013-08-16 MED ORDER — SODIUM CHLORIDE 0.9 % IV BOLUS (SEPSIS)
1000.0000 mL | Freq: Once | INTRAVENOUS | Status: AC
  Administered 2013-08-16: 1000 mL via INTRAVENOUS

## 2013-08-16 MED ORDER — ALBUTEROL SULFATE (2.5 MG/3ML) 0.083% IN NEBU
2.5000 mg | INHALATION_SOLUTION | RESPIRATORY_TRACT | Status: DC | PRN
  Administered 2013-08-17 – 2013-08-18 (×2): 2.5 mg via RESPIRATORY_TRACT
  Filled 2013-08-16 (×2): qty 3

## 2013-08-16 MED ORDER — POTASSIUM CHLORIDE IN NACL 40-0.9 MEQ/L-% IV SOLN
INTRAVENOUS | Status: DC
  Administered 2013-08-16 – 2013-08-17 (×2): via INTRAVENOUS
  Administered 2013-08-18: 100 mL/h via INTRAVENOUS
  Filled 2013-08-16 (×7): qty 1000

## 2013-08-16 MED ORDER — IPRATROPIUM BROMIDE 0.02 % IN SOLN
0.5000 mg | Freq: Once | RESPIRATORY_TRACT | Status: AC
  Administered 2013-08-16: 0.5 mg via RESPIRATORY_TRACT
  Filled 2013-08-16: qty 2.5

## 2013-08-16 MED ORDER — ALBUTEROL SULFATE (2.5 MG/3ML) 0.083% IN NEBU
5.0000 mg | INHALATION_SOLUTION | Freq: Once | RESPIRATORY_TRACT | Status: AC
  Administered 2013-08-16: 5 mg via RESPIRATORY_TRACT
  Filled 2013-08-16: qty 6

## 2013-08-16 MED ORDER — NICOTINE 21 MG/24HR TD PT24
21.0000 mg | MEDICATED_PATCH | Freq: Every day | TRANSDERMAL | Status: DC
  Administered 2013-08-16 – 2013-08-18 (×2): 21 mg via TRANSDERMAL
  Filled 2013-08-16 (×3): qty 1

## 2013-08-16 MED ORDER — DOXYCYCLINE HYCLATE 100 MG IV SOLR
100.0000 mg | Freq: Two times a day (BID) | INTRAVENOUS | Status: DC
  Administered 2013-08-16 – 2013-08-17 (×3): 100 mg via INTRAVENOUS
  Filled 2013-08-16 (×7): qty 100

## 2013-08-16 MED ORDER — ALBUTEROL SULFATE (2.5 MG/3ML) 0.083% IN NEBU
INHALATION_SOLUTION | RESPIRATORY_TRACT | Status: AC
  Administered 2013-08-16: 2.5 mg
  Filled 2013-08-16: qty 3

## 2013-08-16 MED ORDER — PNEUMOCOCCAL VAC POLYVALENT 25 MCG/0.5ML IJ INJ
0.5000 mL | INJECTION | INTRAMUSCULAR | Status: AC
  Administered 2013-08-17: 0.5 mL via INTRAMUSCULAR
  Filled 2013-08-16: qty 0.5

## 2013-08-16 MED ORDER — METHYLPREDNISOLONE SODIUM SUCC 125 MG IJ SOLR: 125.0000 mg | Freq: Once | INTRAMUSCULAR | Status: DC

## 2013-08-16 MED ORDER — HEPARIN SODIUM (PORCINE) 5000 UNIT/ML IJ SOLN
5000.0000 [IU] | Freq: Three times a day (TID) | INTRAMUSCULAR | Status: DC
  Administered 2013-08-16 – 2013-08-18 (×5): 5000 [IU] via SUBCUTANEOUS
  Filled 2013-08-16 (×8): qty 1

## 2013-08-16 MED ORDER — DEXTROSE 5 % IV SOLN
500.0000 mg | Freq: Once | INTRAVENOUS | Status: AC
  Administered 2013-08-16: 500 mg via INTRAVENOUS

## 2013-08-16 MED ORDER — POTASSIUM CHLORIDE IN NACL 20-0.9 MEQ/L-% IV SOLN
Freq: Once | INTRAVENOUS | Status: AC
  Administered 2013-08-16: 18:00:00 via INTRAVENOUS
  Filled 2013-08-16: qty 1000

## 2013-08-16 MED ORDER — METHYLPREDNISOLONE SODIUM SUCC 125 MG IJ SOLR
125.0000 mg | Freq: Three times a day (TID) | INTRAMUSCULAR | Status: DC
  Administered 2013-08-16 – 2013-08-18 (×6): 125 mg via INTRAVENOUS
  Filled 2013-08-16 (×8): qty 2

## 2013-08-16 MED ORDER — DEXTROSE 5 % IV SOLN
1.0000 g | Freq: Once | INTRAVENOUS | Status: AC
  Administered 2013-08-16: 1 g via INTRAVENOUS
  Filled 2013-08-16: qty 10

## 2013-08-16 NOTE — ED Notes (Signed)
Sputum sample collected/labelled/sent to lab.

## 2013-08-16 NOTE — ED Notes (Signed)
Pt presents via EMS with complaints of SOB. States she has been sick/wheezing for the last several days, became worse yesterday and SOB started this morning. Attempted to go to PCP but was unable d/t sudden onset of SOB. EMS reports SPO2 upon arrival was 84%, other vitals stable. Administered one dose albuterol and pt felt better, attempted to go to bathroom, but became short of breath, gray and SPO2 went down to 80%. Received two doses albuterol, one Duoneb, and one dose of solumedrol. No hx COPD, smokes one pack a day for 40 years. Reports she has been "coughing up green stuff" for the last few days as well.

## 2013-08-16 NOTE — H&P (Addendum)
Hospitalist Admission History and Physical  Patient name: [name] Medical record number: [id number] Date of birth: [birth year] Age: 58 y.o. Gender: female  Primary Care Provider: No primary provider on file.  Chief Complaint: dyspnea, ? PNA vs COPD exacerbation   History of Present Illness:This is a 58 y.o. year old female with no significant prior medical history apart from 40 pack year smoking history presenting progressive dyspnea and wheezing. Pt has had URI sxs including rhinorrhea, nasal congestion, cough for the past 3 days. Has had progressive SOB and wheezing over past 1-2 days. Still smoking up to 1PPD. Decreased appetite. No vomiting, diarrhea. No CP. No orthopnea, PND. Had fevers over the weekend. No flu shot this year. Pt's husband called EMS because pt was severely dyspneic at rest. EMS was called. Supplemental O2 placed. Pt also given solumedrol 125mg  x1.   In the ER, O2 sats initially in upper 80s to low 90s. CXR obtained that was negative for acute infiltrate, but did show changes consistent with COPD. Temp 98.6. WBC borderline @ 10.5. K noted @ 2.8. Supplemental O2 placed. Pt stated NS+KCl. Pt started on presumed CAP by EDP with Rocephin and Azithromycin.   Patient Active Problem List   Diagnosis Date Noted  . Dyspnea 08/16/2013   Past Medical History: Past Medical History  Diagnosis Date  . Chronic back pain     Past Surgical History: Past Surgical History  Procedure Laterality Date  . Back surgery    . Ileo loop neobladder      Social History: History   Social History  . Marital Status: Married    Spouse Name: N/A    Number of Children: N/A  . Years of Education: N/A   Social History Main Topics  . Smoking status: Current Every Day Smoker -- 1.00 packs/day for 40 years    Types: Cigarettes  . Smokeless tobacco: None  . Alcohol Use: No  . Drug Use: None  . Sexual Activity: None   Other Topics Concern  . None   Social History Narrative  . None     Family History: History reviewed. No pertinent family history.  Allergies: No Known Allergies  Current Facility-Administered Medications  Medication Dose Route Frequency Provider Last Rate Last Dose  . 0.9 % NaCl with KCl 40 mEq / L  infusion   Intravenous Continuous Doree AlbeeSteven Jaedon Siler, MD      . heparin injection 5,000 Units  5,000 Units Subcutaneous 3 times per day Doree AlbeeSteven Sang Blount, MD       Current Outpatient Prescriptions  Medication Sig Dispense Refill  . ALPRAZolam (XANAX) 0.5 MG tablet Take 0.5 mg by mouth daily.      Marland Kitchen. HYDROmorphone HCl (DILAUDID PO) Take 1 tablet by mouth daily. 12mg       . PARoxetine (PAXIL) 30 MG tablet Take 30 mg by mouth at bedtime.      Marland Kitchen. zolpidem (AMBIEN) 10 MG tablet Take 10 mg by mouth at bedtime as needed for sleep.       Review Of Systems: 12 point ROS negative except as noted above in HPI.  Physical Exam: Filed Vitals:   08/16/13 1845  BP: 116/57  Pulse: 83  Temp:   Resp:     General: alert and cooperative HEENT: PERRLA and extra ocular movement intact,+nasal erythema, rhinorrhea bilaterally, + post oropharyngeal erythema  Heart: S1, S2 normal, no murmur, rub or gallop, regular rate and rhythm Lungs: wheezing, expiratory wheezes and mild increased WOB  Abdomen: abdomen is soft without  significant tenderness, masses, organomegaly or guarding Extremities: extremities normal, atraumatic, no cyanosis or edema Skin:no rashes Neurology: normal without focal findings  Labs and Imaging: Lab Results  Component Value Date/Time   NA 138 08/16/2013  4:22 PM   K 2.8* 08/16/2013  4:22 PM   CL 94* 08/16/2013  4:22 PM   CO2 27 08/16/2013  4:22 PM   BUN 10 08/16/2013  4:22 PM   CREATININE 0.57 08/16/2013  4:22 PM   GLUCOSE 132* 08/16/2013  4:22 PM   Lab Results  Component Value Date   WBC 10.5 08/16/2013   HGB 12.4 08/16/2013   HCT 36.0 08/16/2013   MCV 93.0 08/16/2013   PLT 121* 08/16/2013    Dg Chest Portable 1 View  08/16/2013   CLINICAL DATA:  Four-day history  of cough and dyspnea, history of COPD.  EXAM: PORTABLE CHEST - 1 VIEW  COMPARISON:  CT scan of the chest dated March 09, 2010.  FINDINGS: The lungs are hyperinflated. There is no focal infiltrate. There is no pleural effusion or pneumothorax. The cardiac silhouette is normal in size. The pulmonary vascularity is not engorged. The mediastinum is normal in width. There is no pleural effusion. The observed portions of the bony thorax appear normal.  IMPRESSION: There is hyperinflation consistent with COPD. There is no evidence of pneumonia nor CHF or other acute cardiopulmonary disease.   Electronically Signed   By: David  [place]   On: 08/16/2013 17:33     Assessment and Plan: LISSETTE SCHENK is a 58 y.o. year old female presenting with dyspnea  Dyspnea: exam and history clinically consistent with COPD exacerbation. Likely viral induced. CXR w/ COPD changes and no focal infiltrate (though pt is clinically dry).  Noted > 40 pack year smoking history with noted wheezing. Start on solumedrol 125mg  IV q 8, prn albuterol, IV doxycycline. Repeat CXR in am as pt gets more hydrated. Continue supplemental O2. Tobacco abuse counseling.   Hyopokalemia: likely secondary to dehydration and recent albuterol use. NS+ KCL. Serial CMETs. Mag level. Reassess in am.   Tobacco abuse: cessation counseling. Nicotine patch.   FEN/GI: reg diet. Replete K and reassess in am.  Prophylaxis: sub q heparin.  Disposition: pending further evaluation.  Code Status:full code.       Doree Albee MD  Pager: [phone]

## 2013-08-16 NOTE — ED Notes (Signed)
Pt with increased shortness of breath pivoting from bed to bedside commode.  Audible expiratory wheezing.  O2 sats maintained in the 90's.

## 2013-08-16 NOTE — ED Provider Notes (Addendum)
CSN: [id number]     Arrival date & time 08/16/13  1501 History   First MD Initiated Contact with Patient 08/16/13 1523     Chief Complaint  Patient presents with  . Shortness of Breath     (Consider location/radiation/quality/duration/timing/severity/associated sxs/prior Treatment) HPI Comments: Pt with h/o sinus problems, has had sinus pressure and some post nasal drip, some sore throat.  SOB is worse with exertion today.  Yesterday did feel somewhat improved.  Appetite down, no eating or drinking much today.  PCP is PA with [facility].  EMS provided nebs with some relief in symptoms initially, but has remained hypoxic.  No h/o COPD or O2 requirement at baseline.    Patient is a 58 y.o. female presenting with shortness of breath. The history is provided by the patient and the spouse.  Shortness of Breath Severity:  Moderate Onset quality:  Gradual Duration:  3 days Timing:  Constant Progression:  Worsening Chronicity:  New Context: URI   Relieved by:  Nothing Worsened by:  Exertion, activity and coughing Associated symptoms: cough and sore throat   Associated symptoms: no abdominal pain and no vomiting   Risk factors: tobacco use     Past Medical History  Diagnosis Date  . Chronic back pain    Past Surgical History  Procedure Laterality Date  . Back surgery    . Ileo loop neobladder     History reviewed. No pertinent family history. History  Substance Use Topics  . Smoking status: Current Every Day Smoker -- 1.00 packs/day for 40 years    Types: Cigarettes  . Smokeless tobacco: Not on file  . Alcohol Use: No   OB History   Grav Para Term Preterm Abortions TAB SAB Ect Mult Living                 Review of Systems  Constitutional: Positive for appetite change and fatigue.  HENT: Positive for postnasal drip, sinus pressure and sore throat.   Respiratory: Positive for cough and shortness of breath.   Gastrointestinal: Negative for nausea, vomiting and  abdominal pain.  Musculoskeletal: Positive for back pain.  All other systems reviewed and are negative.      Allergies  Review of patient's allergies indicates no known allergies.  Home Medications   Current Outpatient Rx  Name  Route  Sig  Dispense  Refill  . ALPRAZolam (XANAX) 0.5 MG tablet   Oral   Take 0.5 mg by mouth daily.         Marland Kitchen HYDROmorphone HCl (DILAUDID PO)   Oral   Take 1 tablet by mouth daily. 12mg          . PARoxetine (PAXIL) 30 MG tablet   Oral   Take 30 mg by mouth at bedtime.         Marland Kitchen zolpidem (AMBIEN) 10 MG tablet   Oral   Take 10 mg by mouth at bedtime as needed for sleep.          BP 120/89  Pulse 85  Temp(Src) 98.6 F (37 C) (Rectal)  Resp 22  Ht 5' (1.524 m)  Wt 125 lb (56.7 kg)  BMI 24.41 kg/m2  SpO2 93% Physical Exam  Nursing note and vitals reviewed. Constitutional: She is oriented to person, place, and time. She appears well-developed and well-nourished. No distress.  HENT:  Head: Normocephalic and atraumatic.  Eyes: Conjunctivae and EOM are normal. No scleral icterus.  Neck: Normal range of motion. Neck supple.  Cardiovascular: Regular rhythm and intact distal pulses.  Tachycardia present.   Pulmonary/Chest: She is in respiratory distress. She has wheezes.  Paroxysmal coughing with sputum production  Abdominal: Soft. She exhibits no distension. There is no tenderness.  Neurological: She is alert and oriented to person, place, and time. She exhibits normal muscle tone. Coordination normal.  Skin: Skin is warm. No rash noted. She is not diaphoretic.  Psychiatric: Her behavior is normal. Judgment and thought content normal. Her mood appears anxious. Her speech is rapid and/or pressured. Cognition and memory are not impaired.    ED Course  Procedures (including critical care time) Labs Review Labs Reviewed  CBC WITH DIFFERENTIAL - Abnormal; Notable for the following:    Platelets 121 (*)    Neutrophils Relative % 90 (*)     Neutro Abs 9.4 (*)    Lymphocytes Relative 7 (*)    All other components within normal limits  BASIC METABOLIC PANEL - Abnormal; Notable for the following:    Potassium 2.8 (*)    Chloride 94 (*)    Glucose, Bld 132 (*)    All other components within normal limits  CULTURE, EXPECTORATED SPUTUM-ASSESSMENT   Imaging Review Dg Chest Portable 1 View  08/16/2013   CLINICAL DATA:  Four-day history of cough and dyspnea, history of COPD.  EXAM: PORTABLE CHEST - 1 VIEW  COMPARISON:  CT scan of the chest dated March 09, 2010.  FINDINGS: The lungs are hyperinflated. There is no focal infiltrate. There is no pleural effusion or pneumothorax. The cardiac silhouette is normal in size. The pulmonary vascularity is not engorged. The mediastinum is normal in width. There is no pleural effusion. The observed portions of the bony thorax appear normal.  IMPRESSION: There is hyperinflation consistent with COPD. There is no evidence of pneumonia nor CHF or other acute cardiopulmonary disease.   Electronically Signed   By: David  [place]   On: 08/16/2013 17:33     EKG Interpretation   Date/Time:  Monday August 16 2013 15:05:54 EDT Ventricular Rate:  96 PR Interval:  142 QRS Duration: 107 QT Interval:  373 QTC Calculation: 471 R Axis:   70 Text Interpretation:  Sinus rhythm Incomplete right bundle branch block  Non-specific ST-t changes Abnormal ECG Confirmed by [facility] Polycl[facility] LtdGHIM  MD, MICHEAL  ([id number]) on 08/16/2013 3:59:34 PM      O2 sat 92% on 4L of O2 is abnormal by my interpretation  5:50 PM Pt with some improvement in symptoms, PCXR shows no infiltrate.  However given hypoxia and no prior h/o COPD, abx are warranted in my opinion, and pneumonia may still show radiographically later on and treat as CAP.  Will consult with [facility] for admission, also give additional nebs.  Steroids given by EMS prior to arrival.    MDM   Final diagnoses:  Community acquired pneumonia  Hypoxia  Anxiety  Chronic  back pain    Pt feels febrile, tactile, with low O2 sats, exp wheezing, productive, purulent coughing, appears dehydrated, likely with CAP.  Will need nebs, IV abx and admission given hypoemia.  Pt is mildly anxious based on exam, also likely compounded by albuterol usage, will give any benzo's.      Gavin PoundMichael Y. Oletta LamasGhim, MD 08/16/13 1751  Gavin PoundMichael Y. Sylvana Bonk, MD 08/16/13 1801

## 2013-08-17 ENCOUNTER — Observation Stay (HOSPITAL_COMMUNITY): Payer: 59

## 2013-08-17 DIAGNOSIS — I369 Nonrheumatic tricuspid valve disorder, unspecified: Secondary | ICD-10-CM

## 2013-08-17 DIAGNOSIS — J9600 Acute respiratory failure, unspecified whether with hypoxia or hypercapnia: Principal | ICD-10-CM | POA: Diagnosis present

## 2013-08-17 DIAGNOSIS — R0902 Hypoxemia: Secondary | ICD-10-CM

## 2013-08-17 DIAGNOSIS — J441 Chronic obstructive pulmonary disease with (acute) exacerbation: Secondary | ICD-10-CM

## 2013-08-17 LAB — COMPREHENSIVE METABOLIC PANEL
ALBUMIN: 3.5 g/dL (ref 3.5–5.2)
ALK PHOS: 97 U/L (ref 39–117)
ALT: 23 U/L (ref 0–35)
AST: 26 U/L (ref 0–37)
BUN: 9 mg/dL (ref 6–23)
CO2: 23 mEq/L (ref 19–32)
Calcium: 8.8 mg/dL (ref 8.4–10.5)
Chloride: 101 mEq/L (ref 96–112)
Creatinine, Ser: 0.47 mg/dL — ABNORMAL LOW (ref 0.50–1.10)
GFR calc Af Amer: >90 mL/min (ref >90)
GFR calc non Af Amer: >90 mL/min (ref >90)
Glucose, Bld: 132 mg/dL — ABNORMAL HIGH (ref 70–99)
POTASSIUM: 3.4 meq/L — AB (ref 3.7–5.3)
SODIUM: 139 meq/L (ref 137–147)
TOTAL PROTEIN: 6.6 g/dL (ref 6.0–8.3)
Total Bilirubin: 0.3 mg/dL (ref 0.3–1.2)

## 2013-08-17 LAB — CBC WITH DIFFERENTIAL/PLATELET
BASOS PCT: 0 % (ref 0–1)
Basophils Absolute: 0 10*3/uL (ref 0.0–0.1)
EOS PCT: 0 % (ref 0–5)
Eosinophils Absolute: 0 10*3/uL (ref 0.0–0.7)
HEMATOCRIT: 34.1 % — AB (ref 36.0–46.0)
Hemoglobin: 11.8 g/dL — ABNORMAL LOW (ref 12.0–15.0)
Lymphocytes Relative: 10 % — ABNORMAL LOW (ref 12–46)
Lymphs Abs: 0.9 10*3/uL (ref 0.7–4.0)
MCH: 32.5 pg (ref 26.0–34.0)
MCHC: 34.6 g/dL (ref 30.0–36.0)
MCV: 93.9 fL (ref 78.0–100.0)
MONO ABS: 0.2 10*3/uL (ref 0.1–1.0)
Monocytes Relative: 2 % — ABNORMAL LOW (ref 3–12)
Neutro Abs: 7.2 10*3/uL (ref 1.7–7.7)
Neutrophils Relative %: 87 % — ABNORMAL HIGH (ref 43–77)
Platelets: 105 10*3/uL — ABNORMAL LOW (ref 150–400)
RBC: 3.63 MIL/uL — ABNORMAL LOW (ref 3.87–5.11)
RDW: 13.6 % (ref 11.5–15.5)
WBC: 8.2 10*3/uL (ref 4.0–10.5)

## 2013-08-17 MED ORDER — PAROXETINE HCL 30 MG PO TABS
30.0000 mg | ORAL_TABLET | Freq: Every day | ORAL | Status: DC
  Administered 2013-08-17: 30 mg via ORAL
  Filled 2013-08-17 (×2): qty 1

## 2013-08-17 MED ORDER — HYDROMORPHONE HCL 2 MG PO TABS
2.0000 mg | ORAL_TABLET | ORAL | Status: DC | PRN
  Administered 2013-08-17: 2 mg via ORAL
  Filled 2013-08-17: qty 1

## 2013-08-17 MED ORDER — ALPRAZOLAM 0.5 MG PO TABS
0.5000 mg | ORAL_TABLET | Freq: Every day | ORAL | Status: DC | PRN
  Administered 2013-08-17 – 2013-08-18 (×2): 0.5 mg via ORAL
  Filled 2013-08-17 (×2): qty 1

## 2013-08-17 MED ORDER — HYDROMORPHONE HCL 2 MG PO TABS: 12.0000 mg | ORAL_TABLET | Freq: Every day | ORAL | Status: DC | PRN

## 2013-08-17 MED ORDER — HYDROMORPHONE HCL ER 12 MG PO T24A: 12.0000 mg | EXTENDED_RELEASE_TABLET | Freq: Every day | ORAL | Status: DC | PRN

## 2013-08-17 MED ORDER — HYDROMORPHONE HCL ER 8 MG PO T24A
8.0000 mg | EXTENDED_RELEASE_TABLET | ORAL | Status: DC
  Administered 2013-08-17: 8 mg via ORAL
  Filled 2013-08-17: qty 1

## 2013-08-17 NOTE — Progress Notes (Signed)
Patient returned to unit from echo study.  Rating pain in lower medial back 8/10; pain is chronic per patient.  Patient shaky and diaphoretic upon observation, stated that "I think I'm withdrawing from my dilaudid that I take at home".  MD notified as no PRNs for pain available, gave order via telephone to resume home doses of hydromorphone and xanax as PRNs, and Paxil each evening.  Home medication list updated to indicate patient takes hydromorphone SR, not immediate release form.  Orders placed.  Will continue to monitor.

## 2013-08-17 NOTE — Progress Notes (Signed)
[facility] PROGRESS NOTE Interim History: 58 y.o. year old female with no significant prior medical history apart from 40 pack year smoking history presenting progressive dyspnea and wheezing. Pt has had URI sxs including rhinorrhea, nasal congestion, cough for the past 3 days. Has had progressive SOB and wheezing over past 1-2 days    Assessment/Plan: Acute respiratory failure/COPD exacerbation - Continue IV steroids, antibiotics and inhalers. - The patient is desaturating with ambulation - check 2D echo + JVD. O2 sta drop < 88% with out oxygen.  Hypokalemia: Most likely secondary to albuterol. Replete check a mag reassess in the morning.  Disposition: pending further evaluation.  Code Status:full code.  Consultants:  none  Procedures:  CXR  Antibiotics:  Doxy  HPI/Subjective: SOB unchanged  Objective: Filed Vitals:   08/16/13 1945 08/16/13 2029 08/17/13 0226 08/17/13 0643  BP: 119/60 127/58 107/63 106/54  Pulse: 81 84 75 79  Temp:  97.5 F (36.4 C) 97.4 F (36.3 C) 97.7 F (36.5 C)  TempSrc:  Oral Oral Oral  Resp: 20 18 19 20   Height:  5' (1.524 m)    Weight:  56.065 kg (123 lb 9.6 oz)  55.838 kg (123 lb 1.6 oz)  SpO2: 98% 99% 92% 93%    Intake/Output Summary (Last 24 hours) at 08/17/13 1048 Last data filed at 08/17/13 0831  Gross per 24 hour  Intake 1668.33 ml  Output    900 ml  Net 768.33 ml   Filed Weights   08/16/13 1517 08/16/13 2029 08/17/13 0643  Weight: 56.7 kg (125 lb) 56.065 kg (123 lb 9.6 oz) 55.838 kg (123 lb 1.6 oz)    Exam:  General: Alert, awake, oriented x3, in no acute distress.  HEENT: No bruits, no goiter. +JVD Heart: Regular rate and rhythm, without murmurs, rubs, gallops.  Lungs: Good air movement, wheezing B/L Abdomen: Soft, nontender, nondistended, positive bowel sounds.  Neuro: Grossly intact, nonfocal.   Data Reviewed: Basic Metabolic Panel:  Recent Labs Lab 08/16/13 1622 08/16/13 1917 08/17/13 0319    NA 138  --  139  K 2.8*  --  3.4*  CL 94*  --  101  CO2 27  --  23  GLUCOSE 132*  --  132*  BUN 10  --  9  CREATININE 0.57 0.63 0.47*  CALCIUM 9.1  --  8.8  MG  --  1.8  --    Liver Function Tests:  Recent Labs Lab 08/17/13 0319  AST 26  ALT 23  ALKPHOS 97  BILITOT 0.3  PROT 6.6  ALBUMIN 3.5   No results found for this basename: LIPASE, AMYLASE,  in the last 168 hours No results found for this basename: AMMONIA,  in the last 168 hours CBC:  Recent Labs Lab 08/16/13 1622 08/17/13 0319  WBC 10.5 8.2  NEUTROABS 9.4* 7.2  HGB 12.4 11.8*  HCT 36.0 34.1*  MCV 93.0 93.9  PLT 121* 105*   Cardiac Enzymes: No results found for this basename: CKTOTAL, CKMB, CKMBINDEX, TROPONINI,  in the last 168 hours BNP (last 3 results) No results found for this basename: PROBNP,  in the last 8760 hours CBG: No results found for this basename: GLUCAP,  in the last 168 hours  Recent Results (from the past 240 hour(s))  CULTURE, EXPECTORATED SPUTUM-ASSESSMENT     Status: None   Collection Time    08/16/13  4:33 PM      Result Value Ref Range Status   Specimen Description SPUTUM  Final   Special Requests Normal   Final   Sputum evaluation     Final   Value: THIS SPECIMEN IS ACCEPTABLE. RESPIRATORY CULTURE REPORT TO FOLLOW.   Report Status 08/16/2013 FINAL   Final  CULTURE, RESPIRATORY (NON-EXPECTORATED)     Status: None   Collection Time    08/16/13  4:33 PM      Result Value Ref Range Status   Specimen Description SPUTUM   Final   Special Requests NONE   Final   Gram Stain     Final   Value: ABUNDANT WBC PRESENT, PREDOMINANTLY PMN     NO SQUAMOUS EPITHELIAL CELLS SEEN     ABUNDANT GRAM POSITIVE COCCI IN PAIRS     RARE GRAM NEGATIVE RODS     Performed at [facility]   Culture     Final   Value: Culture reincubated for better growth     Performed at [facility]   Report Status PENDING   Incomplete  MRSA PCR SCREENING     Status: None   Collection Time     08/16/13  8:12 PM      Result Value Ref Range Status   MRSA by PCR NEGATIVE  NEGATIVE Final   Comment:            The GeneXpert MRSA Assay (FDA     approved for NASAL specimens     only), is one component of a     comprehensive MRSA colonization     surveillance program. It is not     intended to diagnose MRSA     infection nor to guide or     monitor treatment for     MRSA infections.     Studies: Dg Chest 2 View  08/17/2013   CLINICAL DATA:  Short of breath  EXAM: CHEST  2 VIEW  COMPARISON:  Prior chest x-ray 08/16/2013  FINDINGS: Cardiac and mediastinal contours are stable and remain within normal limits. Severe pulmonary hyperexpansion, central bronchitic changes and bilateral upper lung predominant emphysema. The overall pattern is consistent with a combination of chronic bronchitis and emphysema. There is slightly increased interstitial prominence in the bilateral mid lungs. No pneumothorax or pleural effusion. No acute osseous abnormality.  IMPRESSION: 1. Slightly increased interstitial prominence in the bilateral mid lungs may reflect atypical/viral respiratory infection, or COPD exacerbation. 2. Advanced COPD with upper lung predominant emphysematous changes.   Electronically Signed   By: Malachy Moan M.D.   On: 08/17/2013 08:24   Dg Chest Portable 1 View  08/16/2013   CLINICAL DATA:  Four-day history of cough and dyspnea, history of COPD.  EXAM: PORTABLE CHEST - 1 VIEW  COMPARISON:  CT scan of the chest dated March 09, 2010.  FINDINGS: The lungs are hyperinflated. There is no focal infiltrate. There is no pleural effusion or pneumothorax. The cardiac silhouette is normal in size. The pulmonary vascularity is not engorged. The mediastinum is normal in width. There is no pleural effusion. The observed portions of the bony thorax appear normal.  IMPRESSION: There is hyperinflation consistent with COPD. There is no evidence of pneumonia nor CHF or other acute cardiopulmonary  disease.   Electronically Signed   By: David  [place]   On: 08/16/2013 17:33    Scheduled Meds: . doxycycline (VIBRAMYCIN) IV  100 mg Intravenous Q12H  . heparin  5,000 Units Subcutaneous 3 times per day  . methylPREDNISolone (SOLU-MEDROL) injection  125 mg Intravenous 3 times per day  .  nicotine  21 mg Transdermal Daily  . pneumococcal 23 valent vaccine  0.5 mL Intramuscular Tomorrow-1000   Continuous Infusions: . 0.9 % NaCl with KCl 40 mEq / L 100 mL/hr at 08/16/13 2125     Marinda Elk  [facility] Pager [phone]. If 8PM-8AM, please contact night-coverage at [email or web address], password [facility] 08/17/2013, 10:48 AM  LOS: 1 day

## 2013-08-17 NOTE — Progress Notes (Addendum)
Education for cessation of smoking reiterated and explained.  Verbalized understanding.  Client 's oxygen saturation lowers to the upper eighties when decreased to 2L/[place].  4L  keeps oxygen in the nineties.

## 2013-08-17 NOTE — Progress Notes (Signed)
  Echocardiogram 2D Echocardiogram has been performed.  [name] 08/17/2013, 3:09 PM

## 2013-08-17 NOTE — Progress Notes (Signed)
UR completed 

## 2013-08-18 DIAGNOSIS — G8929 Other chronic pain: Secondary | ICD-10-CM

## 2013-08-18 DIAGNOSIS — M549 Dorsalgia, unspecified: Secondary | ICD-10-CM

## 2013-08-18 DIAGNOSIS — F411 Generalized anxiety disorder: Secondary | ICD-10-CM

## 2013-08-18 LAB — COMPREHENSIVE METABOLIC PANEL
ALK PHOS: 54 U/L (ref 39–117)
ALT: 13 U/L (ref 0–35)
AST: 16 U/L (ref 0–37)
Albumin: 2.5 g/dL — ABNORMAL LOW (ref 3.5–5.2)
BUN: 9 mg/dL (ref 6–23)
CO2: 24 mEq/L (ref 19–32)
Calcium: 8.7 mg/dL (ref 8.4–10.5)
Chloride: 99 mEq/L (ref 96–112)
Creatinine, Ser: 0.76 mg/dL (ref 0.50–1.10)
GFR calc Af Amer: >90 mL/min (ref >90)
GFR calc non Af Amer: >90 mL/min (ref >90)
Glucose, Bld: 112 mg/dL — ABNORMAL HIGH (ref 70–99)
Potassium: 3.8 mEq/L (ref 3.7–5.3)
Sodium: 136 mEq/L — ABNORMAL LOW (ref 137–147)
TOTAL PROTEIN: 7.1 g/dL (ref 6.0–8.3)
Total Bilirubin: 0.3 mg/dL (ref 0.3–1.2)

## 2013-08-18 LAB — CBC WITH DIFFERENTIAL/PLATELET
Basophils Absolute: 0 10*3/uL (ref 0.0–0.1)
Basophils Relative: 0 % (ref 0–1)
Eosinophils Absolute: 0 10*3/uL (ref 0.0–0.7)
Eosinophils Relative: 0 % (ref 0–5)
HCT: 35.5 % — ABNORMAL LOW (ref 36.0–46.0)
HEMOGLOBIN: 12 g/dL (ref 12.0–15.0)
LYMPHS ABS: 1.1 10*3/uL (ref 0.7–4.0)
LYMPHS PCT: 8 % — AB (ref 12–46)
MCH: 32.3 pg (ref 26.0–34.0)
MCHC: 33.8 g/dL (ref 30.0–36.0)
MCV: 95.7 fL (ref 78.0–100.0)
Monocytes Absolute: 0.4 10*3/uL (ref 0.1–1.0)
Monocytes Relative: 3 % (ref 3–12)
NEUTROS PCT: 90 % — AB (ref 43–77)
Neutro Abs: 12.6 10*3/uL — ABNORMAL HIGH (ref 1.7–7.7)
Platelets: 138 10*3/uL — ABNORMAL LOW (ref 150–400)
RBC: 3.71 MIL/uL — AB (ref 3.87–5.11)
RDW: 14.2 % (ref 11.5–15.5)
WBC: 14.1 10*3/uL — ABNORMAL HIGH (ref 4.0–10.5)

## 2013-08-18 MED ORDER — TIOTROPIUM BROMIDE MONOHYDRATE 18 MCG IN CAPS: 18.0000 ug | ORAL_CAPSULE | Freq: Every day | RESPIRATORY_TRACT | Status: DC

## 2013-08-18 MED ORDER — NICOTINE 21 MG/24HR TD PT24: 21.0000 mg | MEDICATED_PATCH | Freq: Every day | TRANSDERMAL | Status: DC

## 2013-08-18 MED ORDER — BUDESONIDE-FORMOTEROL FUMARATE 160-4.5 MCG/ACT IN AERO: 2.0000 | INHALATION_SPRAY | Freq: Two times a day (BID) | RESPIRATORY_TRACT | Status: DC

## 2013-08-18 MED ORDER — DOXYCYCLINE HYCLATE 100 MG PO TABS: 100.0000 mg | ORAL_TABLET | Freq: Two times a day (BID) | ORAL | Status: DC

## 2013-08-18 MED ORDER — ALBUTEROL SULFATE HFA 108 (90 BASE) MCG/ACT IN AERS: 2.0000 | INHALATION_SPRAY | Freq: Four times a day (QID) | RESPIRATORY_TRACT | Status: DC | PRN

## 2013-08-18 MED ORDER — DOXYCYCLINE HYCLATE 100 MG PO TABS
100.0000 mg | ORAL_TABLET | Freq: Two times a day (BID) | ORAL | Status: DC
  Administered 2013-08-18: 100 mg via ORAL
  Filled 2013-08-18 (×3): qty 1

## 2013-08-18 MED ORDER — PREDNISONE 20 MG PO TABS: ORAL_TABLET | ORAL | Status: DC

## 2013-08-18 NOTE — Progress Notes (Signed)
SATURATION QUALIFICATIONS: (This note is used to comply with regulatory documentation for home oxygen)  Patient Saturations on Room Air at Rest = 95%  Patient Saturations on Room Air while Ambulating = 88%  Patient Saturations on 2 Liters of oxygen while Ambulating = 95%  Please briefly explain why patient needs home oxygen: Oxygen saturations dropped to 88% on room air then recovered to 95% on 2 liters of oxygen.

## 2013-08-18 NOTE — Progress Notes (Signed)
SATURATION QUALIFICATIONS: (This note is used to comply with regulatory documentation for home oxygen)  Patient Saturations on Room Air at Rest = 95%  Patient Saturations on Room Air while Ambulating = 88% for about 5 sec, did not sustain  Patient Saturations on 0 Liters of oxygen while Ambulating = 95%  Please briefly explain why patient needs home oxygen: Oxygen saturation dropped to 88% for about 5 seconds and then came back up to 95% without oxygen. Notified doctor.

## 2013-08-18 NOTE — Progress Notes (Signed)
[name] to be D/C'd Home per MD order.  Discussed with the patient and all questions fully answered.    Medication List         albuterol 108 (90 BASE) MCG/ACT inhaler  Commonly known as:  PROVENTIL HFA;VENTOLIN HFA  Inhale 2 puffs into the lungs every 6 (six) hours as needed for wheezing or shortness of breath.     ALPRAZolam 0.5 MG tablet  Commonly known as:  XANAX  Take 0.5 mg by mouth daily.     budesonide-formoterol 160-4.5 MCG/ACT inhaler  Commonly known as:  SYMBICORT  Inhale 2 puffs into the lungs 2 (two) times daily.     doxycycline 100 MG tablet  Commonly known as:  VIBRA-TABS  Take 1 tablet (100 mg total) by mouth 2 (two) times daily.     HYDROmorphone HCl 12 MG T24a SR tablet  Commonly known as:  EXALGO  Take 12 mg by mouth daily.     nicotine 21 mg/24hr patch  Commonly known as:  NICODERM CQ - dosed in mg/24 hours  Place 1 patch (21 mg total) onto the skin daily.     PARoxetine 30 MG tablet  Commonly known as:  PAXIL  Take 30 mg by mouth at bedtime.     predniSONE 20 MG tablet  Commonly known as:  DELTASONE  Take 3 tablets by mouth daily X 2 days; then 2 tablets by mouth daily X 3 days; then 1 tablet by mouth daily X 2 days; then 1/2 tablet by mouth daily X 3 days and stop prednisone     tiotropium 18 MCG inhalation capsule  Commonly known as:  SPIRIVA HANDIHALER  Place 1 capsule (18 mcg total) into inhaler and inhale daily.     zolpidem 10 MG tablet  Commonly known as:  AMBIEN  Take 10 mg by mouth at bedtime as needed for sleep.        VVS, Skin clean, dry and intact without evidence of skin break down, no evidence of skin tears noted. IV catheter discontinued intact. Site without signs and symptoms of complications. Dressing and pressure applied.  An After Visit Summary was printed and given to the patient.  D/c education completed with patient/family including follow up instructions, medication list, d/c activities limitations if indicated,  with other d/c instructions as indicated by MD - patient able to verbalize understanding, all questions fully answered.   Patient instructed to return to ED, call 911, or call MD for any changes in condition.   Patient escorted via WC, and D/C home via private auto.  Kennon RoundsHANEY, [name] 08/18/2013 4:43 PM

## 2013-08-18 NOTE — Discharge Summary (Signed)
Physician Discharge Summary  [name]:[id number] DOB: 02/03/1956 DOA: 08/16/2013  PCP: No primary provider on file.  Admit date: 08/16/2013 Discharge date: 08/18/2013  Time spent: >30 minutes  Recommendations for Outpatient Follow-up:  1. BMET to follow electrolytes and renal function 2. CBC to follow WBC's trend 3. Needs follow up with pulmonary service for PFT's and further evaluation and treatment decisions on her COPD 4. Reassess needs for oxygen supplementation  Discharge Diagnoses:  Active Problems:   COPD exacerbation   Acute respiratory failure GERD Chronic back pain Tobacco abuse Hypokalemia Leukocytosis  Bronchitis/early PNA  Discharge Condition: stable and improved. Patient with desat into 88% with exertion, O2 supplementation arranged at discharge. Patient will follow with PCP in 10 days. Also with instructions to follow with [facility] pulmonology service after 2 weeks.  Diet recommendation: heart healthy diet  Filed Weights   08/16/13 2029 08/17/13 0643 08/18/13 0631  Weight: 56.065 kg (123 lb 9.6 oz) 55.838 kg (123 lb 1.6 oz) 56.382 kg (124 lb 4.8 oz)    History of present illness:  58 y.o. year old female with no significant prior medical history apart from 40 pack year smoking history presenting progressive dyspnea and wheezing. Pt has had URI sxs including rhinorrhea, nasal congestion, cough for the past 3 days. Has had progressive SOB and wheezing over past 1-2 days. Still smoking up to 1PPD. Decreased appetite. No vomiting, diarrhea. No CP. No orthopnea, PND. Had fevers over the weekend. No flu shot this year. Pt's husband called EMS because pt was severely dyspneic at rest. EMS was called. Supplemental O2 placed. Pt also given solumedrol 125mg  x1.  In the ER, O2 sats initially in upper 80s to low 90s. CXR obtained that was negative for acute infiltrate, but did show changes consistent with COPD. Temp 98.6. WBC borderline @ 10.5. K noted @ 2.8. Supplemental  O2 placed. Pt stated NS+KCl. Pt started on presumed CAP by EDP with Rocephin and Azithromycin.    Hospital Course:  1-acute respiratory failure with hypoxia: due to COPD exacerbation and bronchitis/early PNA -significantly improved at discharge -will send home with tapering steroids, antibiotics, PRN albuterol and will start patient on symbicort and spiriva -patient advise to quit smoking -follow up with pulmonary service  2-COPD: advise to quit smoking. PFT's and follow up with pulmonary service as an outpatient -started on symbicort and spiriva  3-GERD: continue PPI  4-leukocytosis: due to bronchitis/early PNA and use of steroids. -no fever. -will complete antibiotics therapy -repeat CBC as an outpatient to follow WBC's trend  5-tobacco abuse: advise/encourage to quit. -discharge with prescription for nicotine patch   6-hypokalemia: repleted. Most likely due to albuterol use in ED  7-chronic back pain: continue home pain medication regimen. Stable.   Procedures:  See below for x-ray reports  2-D echo: 08/17/13 - Left ventricle: The cavity size was normal. Systolic function was normal. The estimated ejection fraction was in the range of 55% to 60%. Wall motion was normal; there were no regional wall motion abnormalities. - Atrial septum: No defect or patent foramen ovale was identified. - Pericardium, extracardiac: A trivial pericardial effusion was identified posterior to the heart.    Consultations:  None   Discharge Exam: Filed Vitals:   08/18/13 1436  BP: 121/53  Pulse: 84  Temp: 97.6 F (36.4 C)  Resp: 20    General: afebrile, no CP and significant improvement in her breathing Cardiovascular: S1 and S2, no rubs or gallops Respiratory: slight end exp wheezing; no crackles;  scattered rhonchi Abdomen: soft, NT, ND, positive BS Extremities: no edema, no cyanosis or clubbing  Discharge Instructions  Discharge Orders   Future Orders Complete By  Expires   Discharge instructions  As directed    Comments:     Take medications as prescribed Follow with PCP in 10 days Arrange follow up with pulmonology service in 14 days  Stop smoking Follow a heart healthy diet       Medication List         albuterol 108 (90 BASE) MCG/ACT inhaler  Commonly known as:  PROVENTIL HFA;VENTOLIN HFA  Inhale 2 puffs into the lungs every 6 (six) hours as needed for wheezing or shortness of breath.     ALPRAZolam 0.5 MG tablet  Commonly known as:  XANAX  Take 0.5 mg by mouth daily.     budesonide-formoterol 160-4.5 MCG/ACT inhaler  Commonly known as:  SYMBICORT  Inhale 2 puffs into the lungs 2 (two) times daily.     doxycycline 100 MG tablet  Commonly known as:  VIBRA-TABS  Take 1 tablet (100 mg total) by mouth 2 (two) times daily.     HYDROmorphone HCl 12 MG T24a SR tablet  Commonly known as:  EXALGO  Take 12 mg by mouth daily.     nicotine 21 mg/24hr patch  Commonly known as:  NICODERM CQ - dosed in mg/24 hours  Place 1 patch (21 mg total) onto the skin daily.     PARoxetine 30 MG tablet  Commonly known as:  PAXIL  Take 30 mg by mouth at bedtime.     predniSONE 20 MG tablet  Commonly known as:  DELTASONE  Take 3 tablets by mouth daily X 2 days; then 2 tablets by mouth daily X 3 days; then 1 tablet by mouth daily X 2 days; then 1/2 tablet by mouth daily X 3 days and stop prednisone     tiotropium 18 MCG inhalation capsule  Commonly known as:  SPIRIVA HANDIHALER  Place 1 capsule (18 mcg total) into inhaler and inhale daily.     zolpidem 10 MG tablet  Commonly known as:  AMBIEN  Take 10 mg by mouth at bedtime as needed for sleep.       No Known Allergies     Follow-up Information   Follow up with [facility]. Schedule an appointment as soon as possible for a visit in 2 weeks.   Specialty:  Pulmonology   Contact information:   [address] [place] [address] [phone]       The results of  significant diagnostics from this hospitalization (including imaging, microbiology, ancillary and laboratory) are listed below for reference.    Significant Diagnostic Studies: Dg Chest 2 View  08/17/2013   CLINICAL DATA:  Short of breath  EXAM: CHEST  2 VIEW  COMPARISON:  Prior chest x-ray 08/16/2013  FINDINGS: Cardiac and mediastinal contours are stable and remain within normal limits. Severe pulmonary hyperexpansion, central bronchitic changes and bilateral upper lung predominant emphysema. The overall pattern is consistent with a combination of chronic bronchitis and emphysema. There is slightly increased interstitial prominence in the bilateral mid lungs. No pneumothorax or pleural effusion. No acute osseous abnormality.  IMPRESSION: 1. Slightly increased interstitial prominence in the bilateral mid lungs may reflect atypical/viral respiratory infection, or COPD exacerbation. 2. Advanced COPD with upper lung predominant emphysematous changes.   Electronically Signed   By: Malachy Moan M.D.   On: 08/17/2013 08:24   Dg Chest Portable  1 View  08/16/2013   CLINICAL DATA:  Four-day history of cough and dyspnea, history of COPD.  EXAM: PORTABLE CHEST - 1 VIEW  COMPARISON:  CT scan of the chest dated March 09, 2010.  FINDINGS: The lungs are hyperinflated. There is no focal infiltrate. There is no pleural effusion or pneumothorax. The cardiac silhouette is normal in size. The pulmonary vascularity is not engorged. The mediastinum is normal in width. There is no pleural effusion. The observed portions of the bony thorax appear normal.  IMPRESSION: There is hyperinflation consistent with COPD. There is no evidence of pneumonia nor CHF or other acute cardiopulmonary disease.   Electronically Signed   By: David  [place]   On: 08/16/2013 17:33    Microbiology: Recent Results (from the past 240 hour(s))  CULTURE, EXPECTORATED SPUTUM-ASSESSMENT     Status: None   Collection Time    08/16/13  4:33 PM       Result Value Ref Range Status   Specimen Description SPUTUM   Final   Special Requests Normal   Final   Sputum evaluation     Final   Value: THIS SPECIMEN IS ACCEPTABLE. RESPIRATORY CULTURE REPORT TO FOLLOW.   Report Status 08/16/2013 FINAL   Final  CULTURE, RESPIRATORY (NON-EXPECTORATED)     Status: None   Collection Time    08/16/13  4:33 PM      Result Value Ref Range Status   Specimen Description SPUTUM   Final   Special Requests NONE   Final   Gram Stain     Final   Value: ABUNDANT WBC PRESENT, PREDOMINANTLY PMN     NO SQUAMOUS EPITHELIAL CELLS SEEN     ABUNDANT GRAM POSITIVE COCCI IN PAIRS     RARE GRAM NEGATIVE RODS     Performed at [facility]   Culture     Final   Value: ABUNDANT GRAM NEGATIVE RODS     ABUNDANT STREPTOCOCCUS PNEUMONIAE     Performed at [facility]   Report Status PENDING   Incomplete  MRSA PCR SCREENING     Status: None   Collection Time    08/16/13  8:12 PM      Result Value Ref Range Status   MRSA by PCR NEGATIVE  NEGATIVE Final   Comment:            The GeneXpert MRSA Assay (FDA     approved for NASAL specimens     only), is one component of a     comprehensive MRSA colonization     surveillance program. It is not     intended to diagnose MRSA     infection nor to guide or     monitor treatment for     MRSA infections.     Labs: Basic Metabolic Panel:  Recent Labs Lab 08/16/13 1622 08/16/13 1917 08/17/13 0319 08/18/13 0555  NA 138  --  139 136*  K 2.8*  --  3.4* 3.8  CL 94*  --  101 99  CO2 27  --  23 24  GLUCOSE 132*  --  132* 112*  BUN 10  --  9 9  CREATININE 0.57 0.63 0.47* 0.76  CALCIUM 9.1  --  8.8 8.7  MG  --  1.8  --   --    Liver Function Tests:  Recent Labs Lab 08/17/13 0319 08/18/13 0555  AST 26 16  ALT 23 13  ALKPHOS 97 54  BILITOT 0.3 0.3  PROT  6.6 7.1  ALBUMIN 3.5 2.5*   CBC:  Recent Labs Lab 08/16/13 1622 08/17/13 0319 08/18/13 0812  WBC 10.5 8.2 14.1*  NEUTROABS 9.4* 7.2 12.6*   HGB 12.4 11.8* 12.0  HCT 36.0 34.1* 35.5*  MCV 93.0 93.9 95.7  PLT 121* 105* 138*    Signed:  Armen Pickup  [facility] 08/18/2013, 4:13 PM

## 2013-08-18 NOTE — Care Management Note (Addendum)
  Page 1 of 1   08/18/2013     5:13:13 PM   CARE MANAGEMENT NOTE 08/18/2013  Patient:  [name],[name]   Account Number:  [id number]  Date Initiated:  08/18/2013  Documentation initiated by:  Sitara Cashwell  Subjective/Objective Assessment:   Admitted with dyspnea, COPD, hx/o smoker     Action/Plan:   CM to follow for dispositon needs   Anticipated DC Date:  08/18/2013   Anticipated DC Plan:  HOME/SELF CARE         Choice offered to / List presented to:     DME arranged  [facility]      DME agency  [facility]        Status of service:  Completed, signed off Medicare Important Message given?   (If response is "NO", the following Medicare IM given date fields will be blank) Date Medicare IM given:   Date Additional Medicare IM given:    Discharge Disposition:  HOME/SELF CARE  Per UR Regulation:  Reviewed for med. necessity/level of care/duration of stay  If discussed at Long Length of Stay Meetings, dates discussed:    Comments:  08/18/2013 Patient Saturations on Room Air while Ambulating = 88% for about 5 sec, did not sustain D/c to home with new order for home oxygen [facility] notified) Kearney Evitt RN, BSN, MSHL, CCM 08/18/2013

## 2013-08-19 LAB — CULTURE, RESPIRATORY W GRAM STAIN

## 2019-04-28 ENCOUNTER — Encounter (HOSPITAL_COMMUNITY): Payer: Self-pay

## 2019-04-28 ENCOUNTER — Inpatient Hospital Stay (HOSPITAL_COMMUNITY)
Admission: EM | Admit: 2019-04-28 | Discharge: 2019-05-02 | DRG: 389 | Disposition: A | Discharge status: Home / Self Care | Payer: Medicare Other | Attending: Internal Medicine | Admitting: Internal Medicine

## 2019-04-28 ENCOUNTER — Inpatient Hospital Stay (HOSPITAL_COMMUNITY): Payer: Medicare Other

## 2019-04-28 ENCOUNTER — Emergency Department (HOSPITAL_COMMUNITY): Payer: Medicare Other

## 2019-04-28 ENCOUNTER — Other Ambulatory Visit: Payer: Self-pay

## 2019-04-28 DIAGNOSIS — N179 Acute kidney failure, unspecified: Secondary | ICD-10-CM | POA: Diagnosis present

## 2019-04-28 DIAGNOSIS — Z681 Body mass index (BMI) 19 or less, adult: Secondary | ICD-10-CM | POA: Diagnosis not present

## 2019-04-28 DIAGNOSIS — K56609 Unspecified intestinal obstruction, unspecified as to partial versus complete obstruction: Secondary | ICD-10-CM | POA: Diagnosis present

## 2019-04-28 DIAGNOSIS — Z90710 Acquired absence of both cervix and uterus: Secondary | ICD-10-CM | POA: Diagnosis not present

## 2019-04-28 DIAGNOSIS — Z79899 Other long term (current) drug therapy: Secondary | ICD-10-CM | POA: Diagnosis not present

## 2019-04-28 DIAGNOSIS — K5909 Other constipation: Secondary | ICD-10-CM | POA: Diagnosis present

## 2019-04-28 DIAGNOSIS — Z0189 Encounter for other specified special examinations: Secondary | ICD-10-CM

## 2019-04-28 DIAGNOSIS — R64 Cachexia: Secondary | ICD-10-CM | POA: Diagnosis present

## 2019-04-28 DIAGNOSIS — R6510 Systemic inflammatory response syndrome (SIRS) of non-infectious origin without acute organ dysfunction: Secondary | ICD-10-CM | POA: Diagnosis present

## 2019-04-28 DIAGNOSIS — F419 Anxiety disorder, unspecified: Secondary | ICD-10-CM | POA: Diagnosis present

## 2019-04-28 DIAGNOSIS — J449 Chronic obstructive pulmonary disease, unspecified: Secondary | ICD-10-CM | POA: Diagnosis present

## 2019-04-28 DIAGNOSIS — Z7951 Long term (current) use of inhaled steroids: Secondary | ICD-10-CM | POA: Diagnosis not present

## 2019-04-28 DIAGNOSIS — Z23 Encounter for immunization: Secondary | ICD-10-CM | POA: Diagnosis present

## 2019-04-28 DIAGNOSIS — G8929 Other chronic pain: Secondary | ICD-10-CM | POA: Diagnosis present

## 2019-04-28 DIAGNOSIS — F17210 Nicotine dependence, cigarettes, uncomplicated: Secondary | ICD-10-CM | POA: Diagnosis present

## 2019-04-28 DIAGNOSIS — Z20828 Contact with and (suspected) exposure to other viral communicable diseases: Secondary | ICD-10-CM | POA: Diagnosis present

## 2019-04-28 DIAGNOSIS — Z720 Tobacco use: Secondary | ICD-10-CM | POA: Diagnosis present

## 2019-04-28 DIAGNOSIS — M549 Dorsalgia, unspecified: Secondary | ICD-10-CM | POA: Diagnosis present

## 2019-04-28 DIAGNOSIS — J441 Chronic obstructive pulmonary disease with (acute) exacerbation: Secondary | ICD-10-CM | POA: Diagnosis present

## 2019-04-28 DIAGNOSIS — E876 Hypokalemia: Secondary | ICD-10-CM | POA: Diagnosis present

## 2019-04-28 DIAGNOSIS — K567 Ileus, unspecified: Secondary | ICD-10-CM | POA: Diagnosis present

## 2019-04-28 DIAGNOSIS — K56601 Complete intestinal obstruction, unspecified as to cause: Secondary | ICD-10-CM | POA: Diagnosis present

## 2019-04-28 HISTORY — DX: Tobacco use: Z72.0

## 2019-04-28 HISTORY — DX: Anxiety disorder, unspecified: F41.9

## 2019-04-28 HISTORY — DX: Chronic obstructive pulmonary disease, unspecified: J44.9

## 2019-04-28 LAB — CBC WITH DIFFERENTIAL/PLATELET
Abs Immature Granulocytes: 0.06 10*3/uL (ref 0.00–0.07)
Basophils Absolute: 0.1 10*3/uL (ref 0.0–0.1)
Basophils Relative: 0 %
Eosinophils Absolute: 0.1 10*3/uL (ref 0.0–0.5)
Eosinophils Relative: 1 %
HCT: 44.5 % (ref 36.0–46.0)
Hemoglobin: 15.1 g/dL — ABNORMAL HIGH (ref 12.0–15.0)
Immature Granulocytes: 0 %
Lymphocytes Relative: 13 %
Lymphs Abs: 2.1 10*3/uL (ref 0.7–4.0)
MCH: 34.6 pg — ABNORMAL HIGH (ref 26.0–34.0)
MCHC: 33.9 g/dL (ref 30.0–36.0)
MCV: 101.8 fL — ABNORMAL HIGH (ref 80.0–100.0)
Monocytes Absolute: 0.9 10*3/uL (ref 0.1–1.0)
Monocytes Relative: 6 %
Neutro Abs: 12.4 10*3/uL — ABNORMAL HIGH (ref 1.7–7.7)
Neutrophils Relative %: 80 %
Platelets: 171 10*3/uL (ref 150–400)
RBC: 4.37 MIL/uL (ref 3.87–5.11)
RDW: 13.1 % (ref 11.5–15.5)
WBC: 15.5 10*3/uL — ABNORMAL HIGH (ref 4.0–10.5)
nRBC: 0 % (ref 0.0–0.2)

## 2019-04-28 LAB — CBC
HCT: 43.4 % (ref 36.0–46.0)
Hemoglobin: 15.2 g/dL — ABNORMAL HIGH (ref 12.0–15.0)
MCH: 35.1 pg — ABNORMAL HIGH (ref 26.0–34.0)
MCHC: 35 g/dL (ref 30.0–36.0)
MCV: 100.2 fL — ABNORMAL HIGH (ref 80.0–100.0)
Platelets: 174 10*3/uL (ref 150–400)
RBC: 4.33 MIL/uL (ref 3.87–5.11)
RDW: 12.9 % (ref 11.5–15.5)
WBC: 16 10*3/uL — ABNORMAL HIGH (ref 4.0–10.5)
nRBC: 0 % (ref 0.0–0.2)

## 2019-04-28 LAB — URINALYSIS, ROUTINE W REFLEX MICROSCOPIC
Bilirubin Urine: NEGATIVE
Glucose, UA: NEGATIVE mg/dL
Ketones, ur: NEGATIVE mg/dL
Leukocytes,Ua: NEGATIVE
Nitrite: NEGATIVE
Protein, ur: 30 mg/dL — AB
Specific Gravity, Urine: 1.012 (ref 1.005–1.030)
pH: 7 (ref 5.0–8.0)

## 2019-04-28 LAB — COMPREHENSIVE METABOLIC PANEL
ALT: 18 U/L (ref 0–44)
AST: 19 U/L (ref 15–41)
Albumin: 4.8 g/dL (ref 3.5–5.0)
Alkaline Phosphatase: 166 U/L — ABNORMAL HIGH (ref 38–126)
Anion gap: 13 (ref 5–15)
BUN: 13 mg/dL (ref 8–23)
CO2: 29 mmol/L (ref 22–32)
Calcium: 10.3 mg/dL (ref 8.9–10.3)
Chloride: 98 mmol/L (ref 98–111)
Creatinine, Ser: 1.01 mg/dL — ABNORMAL HIGH (ref 0.44–1.00)
GFR calc Af Amer: >60 mL/min (ref >60)
GFR calc non Af Amer: >60 mL/min (ref >60)
Glucose, Bld: 114 mg/dL — ABNORMAL HIGH (ref 70–99)
Potassium: 3.9 mmol/L (ref 3.5–5.1)
Sodium: 140 mmol/L (ref 135–145)
Total Bilirubin: 0.7 mg/dL (ref 0.3–1.2)
Total Protein: 7.7 g/dL (ref 6.5–8.1)

## 2019-04-28 LAB — SARS CORONAVIRUS 2 (TAT 6-24 HRS): SARS Coronavirus 2: NEGATIVE

## 2019-04-28 LAB — LIPASE, BLOOD: Lipase: 20 U/L (ref 11–51)

## 2019-04-28 LAB — MAGNESIUM: Magnesium: 2.3 mg/dL (ref 1.7–2.4)

## 2019-04-28 MED ORDER — ONDANSETRON HCL 4 MG/2ML IJ SOLN
4.0000 mg | Freq: Four times a day (QID) | INTRAMUSCULAR | Status: DC | PRN
  Administered 2019-04-28 – 2019-04-29 (×3): 4 mg via INTRAVENOUS
  Filled 2019-04-28 (×4): qty 2

## 2019-04-28 MED ORDER — HYDROMORPHONE HCL 1 MG/ML IJ SOLN
1.0000 mg | INTRAMUSCULAR | Status: DC | PRN
  Administered 2019-04-28 – 2019-04-30 (×4): 1 mg via INTRAVENOUS
  Filled 2019-04-28 (×4): qty 1

## 2019-04-28 MED ORDER — LORAZEPAM 2 MG/ML IJ SOLN
0.5000 mg | Freq: Four times a day (QID) | INTRAMUSCULAR | Status: DC | PRN
  Administered 2019-04-28 – 2019-05-01 (×5): 0.5 mg via INTRAVENOUS
  Filled 2019-04-28 (×5): qty 1

## 2019-04-28 MED ORDER — NICOTINE 21 MG/24HR TD PT24
21.0000 mg | MEDICATED_PATCH | Freq: Every day | TRANSDERMAL | Status: DC
  Administered 2019-04-28 – 2019-05-02 (×5): 21 mg via TRANSDERMAL
  Filled 2019-04-28 (×5): qty 1

## 2019-04-28 MED ORDER — ENOXAPARIN SODIUM 40 MG/0.4ML ~~LOC~~ SOLN
40.0000 mg | SUBCUTANEOUS | Status: DC
  Administered 2019-04-28 – 2019-05-02 (×5): 40 mg via SUBCUTANEOUS
  Filled 2019-04-28 (×4): qty 0.4

## 2019-04-28 MED ORDER — ACETAMINOPHEN 650 MG RE SUPP: 650.0000 mg | Freq: Four times a day (QID) | RECTAL | Status: DC | PRN

## 2019-04-28 MED ORDER — SODIUM CHLORIDE 0.9% FLUSH: 3.0000 mL | Freq: Once | INTRAVENOUS | Status: DC

## 2019-04-28 MED ORDER — DIATRIZOATE MEGLUMINE & SODIUM 66-10 % PO SOLN
90.0000 mL | Freq: Once | ORAL | Status: DC
  Filled 2019-04-28: qty 90

## 2019-04-28 MED ORDER — TIOTROPIUM BROMIDE MONOHYDRATE 18 MCG IN CAPS: 18.0000 ug | ORAL_CAPSULE | Freq: Every day | RESPIRATORY_TRACT | Status: DC

## 2019-04-28 MED ORDER — SODIUM CHLORIDE 0.9% FLUSH
3.0000 mL | Freq: Two times a day (BID) | INTRAVENOUS | Status: DC
  Administered 2019-04-28 – 2019-04-30 (×2): 3 mL via INTRAVENOUS

## 2019-04-28 MED ORDER — HYDROMORPHONE HCL 1 MG/ML IJ SOLN
1.0000 mg | Freq: Once | INTRAMUSCULAR | Status: AC
  Administered 2019-04-28: 1 mg via INTRAVENOUS
  Filled 2019-04-28: qty 1

## 2019-04-28 MED ORDER — ALBUTEROL SULFATE (2.5 MG/3ML) 0.083% IN NEBU: 2.5000 mg | INHALATION_SOLUTION | Freq: Four times a day (QID) | RESPIRATORY_TRACT | Status: DC | PRN

## 2019-04-28 MED ORDER — ONDANSETRON 4 MG PO TBDP
4.0000 mg | ORAL_TABLET | Freq: Once | ORAL | Status: AC | PRN
  Administered 2019-04-28: 4 mg via ORAL
  Filled 2019-04-28: qty 1

## 2019-04-28 MED ORDER — ACETAMINOPHEN 325 MG PO TABS: 650.0000 mg | ORAL_TABLET | Freq: Four times a day (QID) | ORAL | Status: DC | PRN

## 2019-04-28 MED ORDER — SODIUM CHLORIDE 0.9 % IV BOLUS
1000.0000 mL | Freq: Once | INTRAVENOUS | Status: AC
  Administered 2019-04-28: 1000 mL via INTRAVENOUS

## 2019-04-28 MED ORDER — IOHEXOL 300 MG/ML  SOLN
100.0000 mL | Freq: Once | INTRAMUSCULAR | Status: AC | PRN
  Administered 2019-04-28: 100 mL via INTRAVENOUS

## 2019-04-28 MED ORDER — SODIUM CHLORIDE 0.9 % IV SOLN
INTRAVENOUS | Status: DC
  Administered 2019-04-28 – 2019-05-01 (×6): via INTRAVENOUS

## 2019-04-28 MED ORDER — BISACODYL 10 MG RE SUPP
10.0000 mg | Freq: Once | RECTAL | Status: AC
  Administered 2019-04-28: 10 mg via RECTAL
  Filled 2019-04-28: qty 1

## 2019-04-28 MED ORDER — UMECLIDINIUM BROMIDE 62.5 MCG/INH IN AEPB
1.0000 | INHALATION_SPRAY | Freq: Every day | RESPIRATORY_TRACT | Status: DC
  Administered 2019-04-29 – 2019-05-02 (×4): 1 via RESPIRATORY_TRACT
  Filled 2019-04-28: qty 7

## 2019-04-28 MED ORDER — PROMETHAZINE HCL 25 MG/ML IJ SOLN: 12.5000 mg | Freq: Four times a day (QID) | INTRAMUSCULAR | Status: DC | PRN

## 2019-04-28 MED ORDER — MOMETASONE FURO-FORMOTEROL FUM 200-5 MCG/ACT IN AERO
2.0000 | INHALATION_SPRAY | Freq: Two times a day (BID) | RESPIRATORY_TRACT | Status: DC
  Administered 2019-04-28 – 2019-05-02 (×8): 2 via RESPIRATORY_TRACT
  Filled 2019-04-28: qty 8.8

## 2019-04-28 NOTE — ED Notes (Signed)
Attempted to call nursing report.  

## 2019-04-28 NOTE — ED Notes (Signed)
Patient transported to CT 

## 2019-04-28 NOTE — H&P (Addendum)
History and Physical    [name] ZOX:[id number] DOB: [birth year] DOA: 04/28/2019  Referring MD/NP/PA: Alvester Chou, MD PCP: Patient, No Pcp Per  Patient coming from: Home   Chief Complaint: Abdominal pain and constipation  I have personally briefly reviewed patient's old medical records in [facility]   HPI: [name] is a 63 y.o. female with medical history significant of COPD, anxiety, chronic pain, and tobacco abuse.  She reports having previous surgeries include vaginal hysterectomy, ileal loop neobladder for interstitial cystitis, and fistula repairs.  She presents with complaints of 4 days of abdominal pain and constipation.  Describes pain as crampy in nature in the epigastric region with tenderness to palpation of the lower quadrants.  She had tried taking Dulcolax and had a small bowel movement 2 days ago.  Associated symptoms included nausea, vomiting, and some mild shortness of breath after taking the magnesium citrate last night.  Emesis was noted to be nonbloody.  Notes that she has not been able to pass flatus at least over the last day.  Patient notes multiple previous abdominal surgeries.  She admits to still smoking about 1 pack cigarettes per day on average.  Denies having any significant fevers, chest pain, dysuria, or daily alcohol use.  ED Course: Upon admission into the emergency department patient was afebrile, tachycardic, and tachypneic.  Labs significant for WBC 15.5, hemoglobin 15.1, and creatinine 1.01.  CT scan of the abdomen revealed a high-grade partial or complete small bowel obstruction with stricture involving loop of the distal/terminal ileum in the right lower quadrant.  General surgery was formally consulted.  Nasogastric tube was placed to suction.  COVID-19 screening was negative.  She was given 1 L normal saline IV fluids, Zofran, and Dilaudid for pain.  TRH called to admit.     Past Medical History:  Diagnosis Date   Anxiety     Chronic back pain    COPD (chronic obstructive pulmonary disease) (HCC)    Tobacco abuse     Past Surgical History:  Procedure Laterality Date   ABDOMINAL HYSTERECTOMY     BACK SURGERY     ILEO LOOP NEOBLADDER     VESICOVAGINAL FISTULA REPAIR       reports that she has been smoking cigarettes. She has a 40.00 pack-year smoking history. She does not have any smokeless tobacco history on file. She reports that she does not drink alcohol. No history on file for drug.  No Known Allergies  History reviewed. No pertinent family history.  Prior to Admission medications   Medication Sig Start Date End Date Taking? Authorizing Provider  albuterol (PROVENTIL HFA;VENTOLIN HFA) 108 (90 BASE) MCG/ACT inhaler Inhale 2 puffs into the lungs every 6 (six) hours as needed for wheezing or shortness of breath. 08/18/13   Vassie Loll, MD  ALPRAZolam Prudy Feeler) 0.5 MG tablet Take 0.5 mg by mouth daily.    [provider]  budesonide-formoterol (SYMBICORT) 160-4.5 MCG/ACT inhaler Inhale 2 puffs into the lungs 2 (two) times daily. 08/18/13   Vassie Loll, MD  doxycycline (VIBRA-TABS) 100 MG tablet Take 1 tablet (100 mg total) by mouth 2 (two) times daily. 08/18/13   Vassie Loll, MD  HYDROmorphone HCl (EXALGO) 12 MG T24A SR tablet Take 12 mg by mouth daily.    [provider]  nicotine (NICODERM CQ - DOSED IN MG/24 HOURS) 21 mg/24hr patch Place 1 patch (21 mg total) onto the skin daily. 08/18/13   Vassie Loll, MD  PARoxetine (PAXIL) 30  MG tablet Take 30 mg by mouth at bedtime.    [provider]  predniSONE (DELTASONE) 20 MG tablet Take 3 tablets by mouth daily X 2 days; then 2 tablets by mouth daily X 3 days; then 1 tablet by mouth daily X 2 days; then 1/2 tablet by mouth daily X 3 days and stop prednisone 08/18/13   Vassie LollMadera, Carlos, MD  tiotropium (SPIRIVA HANDIHALER) 18 MCG inhalation capsule Place 1 capsule (18 mcg total) into inhaler and inhale daily. 08/18/13   Vassie LollMadera,  Carlos, MD  zolpidem (AMBIEN) 10 MG tablet Take 10 mg by mouth at bedtime as needed for sleep.    [provider]    Physical Exam:  Constitutional: Thin elderly female who appears to be in no acute distress at this time. Vitals:   04/28/19 1200 04/28/19 1230 04/28/19 1258 04/28/19 1607  BP: 110/80 115/66 122/64 (!) 109/56  Pulse: 84 73 75 80  Resp: (!) 25 17 (!) 21 17  Temp:   98.6 F (37 C) 98.3 F (36.8 C)  TempSrc:   Oral Oral  SpO2:  94% 97% 91%   Eyes: PERRL, lids and conjunctivae normal ENMT: Mucous membranes are moist. Posterior pharynx clear of any exudate or lesions. Nasogastric tube to suction. Neck: normal, supple, no masses, no thyromegaly Respiratory: Mildly tachypneic with no significant wheezes or rhonchi appreciated.  O2 saturations currently maintained on room air. Cardiovascular: Regular rate and rhythm, no murmurs / rubs / gallops. No extremity edema. 2+ pedal pulses. No carotid bruits.  Abdomen: Mild generalized tenderness to palpation.  Mildly distended abdomen with decreased bowel sounds.  Musculoskeletal: no clubbing / cyanosis. No joint deformity upper and lower extremities. Good ROM, no contractures. Normal muscle tone.  Skin: no rashes, lesions, ulcers. No induration Neurologic: CN 2-12 grossly intact. Sensation intact, DTR normal. Strength 5/5 in all 4.  Psychiatric: Normal judgment and insight. Alert and oriented x 3. Normal mood.     Labs on Admission: I have personally reviewed following labs and imaging studies  CBC: Recent Labs  Lab 04/28/19 0253  WBC 15.5*   16.0*  NEUTROABS 12.4*  HGB 15.1*   15.2*  HCT 44.5   43.4  MCV 101.8*   100.2*  PLT 171   174   Basic Metabolic Panel: Recent Labs  Lab 04/28/19 0253 04/28/19 0833  NA 140  --   K 3.9  --   CL 98  --   CO2 29  --   GLUCOSE 114*  --   BUN 13  --   CREATININE 1.01*  --   CALCIUM 10.3  --   MG  --  2.3   GFR: CrCl cannot be calculated (Unknown ideal  weight.). Liver Function Tests: Recent Labs  Lab 04/28/19 0253  AST 19  ALT 18  ALKPHOS 166*  BILITOT 0.7  PROT 7.7  ALBUMIN 4.8   Recent Labs  Lab 04/28/19 0253  LIPASE 20   No results for input(s): AMMONIA in the last 168 hours. Coagulation Profile: No results for input(s): INR, PROTIME in the last 168 hours. Cardiac Enzymes: No results for input(s): CKTOTAL, CKMB, CKMBINDEX, TROPONINI in the last 168 hours. BNP (last 3 results) No results for input(s): PROBNP in the last 8760 hours. HbA1C: No results for input(s): HGBA1C in the last 72 hours. CBG: No results for input(s): GLUCAP in the last 168 hours. Lipid Profile: No results for input(s): CHOL, HDL, LDLCALC, TRIG, CHOLHDL, LDLDIRECT in the last 72 hours. Thyroid Function  Tests: No results for input(s): TSH, T4TOTAL, FREET4, T3FREE, THYROIDAB in the last 72 hours. Anemia Panel: No results for input(s): VITAMINB12, FOLATE, FERRITIN, TIBC, IRON, RETICCTPCT in the last 72 hours. Urine analysis:    Component Value Date/Time   COLORURINE YELLOW 04/28/2019 0230   APPEARANCEUR HAZY (A) 04/28/2019 0230   LABSPEC 1.012 04/28/2019 0230   PHURINE 7.0 04/28/2019 0230   GLUCOSEU NEGATIVE 04/28/2019 0230   HGBUR MODERATE (A) 04/28/2019 0230   BILIRUBINUR NEGATIVE 04/28/2019 0230   KETONESUR NEGATIVE 04/28/2019 0230   PROTEINUR 30 (A) 04/28/2019 0230   UROBILINOGEN 0.2 05/11/2007 1915   NITRITE NEGATIVE 04/28/2019 0230   LEUKOCYTESUR NEGATIVE 04/28/2019 0230   Sepsis Labs: Recent Results (from the past 240 hour(s))  SARS CORONAVIRUS 2 (TAT 6-24 HRS) Nasopharyngeal Nasopharyngeal Swab     Status: None   Collection Time: 04/28/19 10:35 AM   Specimen: Nasopharyngeal Swab  Result Value Ref Range Status   SARS Coronavirus 2 NEGATIVE NEGATIVE Final    Comment: (NOTE) SARS-CoV-2 target nucleic acids are NOT DETECTED. The SARS-CoV-2 RNA is generally detectable in upper and lower respiratory specimens during the acute phase of  infection. Negative results do not preclude SARS-CoV-2 infection, do not rule out co-infections with other pathogens, and should not be used as the sole basis for treatment or other patient management decisions. Negative results must be combined with clinical observations, patient history, and epidemiological information. The expected result is Negative. Fact Sheet for Patients: HairSlick.no Fact Sheet for Healthcare Providers: quierodirigir.com This test is not yet approved or cleared by the [place] FDA and  has been authorized for detection and/or diagnosis of SARS-CoV-2 by FDA under an Emergency Use Authorization (EUA). This EUA will remain  in effect (meaning this test can be used) for the duration of the COVID-19 declaration under Section 56 4(b)(1) of the Act, 21 U.S.C. section 360bbb-3(b)(1), unless the authorization is terminated or revoked sooner. Performed at [facility], [address], [place], [place] [address]      Radiological Exams on Admission: Ct Abdomen Pelvis W Contrast  Result Date: 04/28/2019 CLINICAL DATA:  Abdominal pain, nausea, constipation EXAM: CT ABDOMEN AND PELVIS WITH CONTRAST TECHNIQUE: Multidetector CT imaging of the abdomen and pelvis was performed using the standard protocol following bolus administration of intravenous contrast. CONTRAST:  OMNIPAQUE IOHEXOL 300 MG/ML  SOLN COMPARISON:  None. FINDINGS: Lower chest: Lung bases are clear. Hepatobiliary: Liver is within normal limits. Gallbladder is unremarkable. No intrahepatic or extrahepatic ductal dilatation. Pancreas: Within normal limits. Spleen: Within normal limits. Adrenals/Urinary Tract: Adrenal glands are within normal limits. Kidneys are within normal limits.  No hydronephrosis. Cystectomy with orthotopic neobladder. Stomach/Bowel: Distended stomach. Multiple dilated loops of small bowel throughout the abdomen. Small bowel  suture line in the right lower quadrant (series 3/image 59). Loop of small bowel distal to the suture is decompressed to the terminal ileum/ileocecal valve (series 3/images 46, 58, and 64), suggesting stricture. Left colon is decompressed. Appendix is not discretely visualized. Vascular/Lymphatic: No evidence of abdominal aortic aneurysm. Atherosclerotic calcifications of the abdominal aorta and branch vessels. No suspicious abdominopelvic lymphadenopathy. Reproductive: Status post hysterectomy. No adnexal masses. Other: No abdominopelvic ascites. Musculoskeletal: Degenerative changes the lower lumbar spine. Ray cage fusion at L4-5. IMPRESSION: High-grade partial or complete small bowel obstruction with stricture involving a loop of distal/terminal ileum in the right lower quadrant. Postsurgical changes related to cystectomy with orthotopic neobladder. Electronically Signed   By: Charline Bills M.D.   On: 04/28/2019  09:31   Dg Abd Portable 1v-small Bowel Protocol-position Verification  Result Date: 04/28/2019 CLINICAL DATA:  Nasogastric tube placement. Small bowel obstruction. EXAM: PORTABLE ABDOMEN - 1 VIEW COMPARISON:  CT abdomen and pelvis April 28, 2019 FINDINGS: Nasogastric tube tip and side port are in the stomach. There is no bowel dilatation or air-fluid level to suggest bowel obstruction. There is moderate air in the stomach. Lung bases are clear. No free air. There is contrast in the renal collecting systems bilaterally. IMPRESSION: Nasogastric tube tip and side port in stomach. Bowel dilatation no longer evident. No free air. Lung bases clear. Electronically Signed   By: Lowella Grip III M.D.   On: 04/28/2019 12:03    Abdominal x-ray: Independently reviewed.  No significant bowel dilatation noted.  Assessment/Plan Small bowel obstruction: Acute.  Patient presents with complaints of abdominal pain, nausea, vomiting, and constipation.  Risk factors include multiple previous abdominal  surgeries as noted above.  High-grade partial or complete small bowel obstruction with stricture. -Admit to MedSurg bed -N.p.o.  -Continue NGT to suction -Antiemetics as needed -IV Dilaudid as needed for pain -Check abdominal x-ray in a.m. -Appreciate general surgery consultative services, we will follow-up for further recommendation  SIRS: Patient presents with WBC 15.5 and tachycardic.  Patient remains afebrile.  Suspect secondary to small bowel obstruction. -Recheck CBC in a.m. -Consider checking blood cultures and starting on empiric antibiotics if patient develops fever  Suspect acute kidney injury: Patient presents with creatinine 1.01.  Baseline previously noted to be around 0.5. -Normal saline IV fluids at 75 mL/h -Recheck kidney function in a.m.  COPD, without acute exacerbation -Continue pharmacy substitution of Dulera for Symbicort and Spiriva -Butyryl nebs as needed  Chronic pain: Patient reports having a long history of back pain from previous surgeries.  She is on oral hydromorphone at home. -Substitution for IV Dilaudid while n.p.o.  Tobacco abuse: Patient still reports smoking tobacco on a regular basis. -Continue nicotine patch   DVT prophylaxis: Lovenox  Code Status: Full Family Communication: No family present at bedside Disposition Plan: Likely discharge home once bowel obstruction resolved Consults called: Surgery Admission status: Inpatient  Norval Morton MD [facility] Pager [phone]   If 7PM-7AM, please contact night-coverage [email or web address] Password [email or web address]  04/28/2019, 7:00 PM

## 2019-04-28 NOTE — Consult Note (Signed)
[facility] Surgery ConsultNote  [name] [birth year]  [id number].    Requesting MD: Renaye Rakers Chief Complaint/Reason for Consult: sbo  HPI:  Patient is a 63 year old female who presented to [facility] with 4 days of abdominal pain, distention and nausea/vomiting. Patient has chronic constipation and thought she was just constipated and tried taking dulcolax and magnesium citrate. She had a small BM 2 days ago, then vomited large volumes after magnesium citrate. Reports lower abdominal pain now but reports prior to vomiting pain was more generalized and cramping in nature. She has not passed any flatus. Patient has complex surgical hx including ileo loop neobladder for interstitial cystitis, fistula repair, vaginal hysterectomy, and some sort of vaginal fistula repair. PMH otherwise significant for COPD as a current smoker. Smokes 1 ppd. Drinks alcohol rarely, denies illicit drug use. Patient has a hx of chronic pain which was treated with methadone and then dilaudid but has been off this for at least 5 years. NKDA. No blood thinning medications.   ROS: Review of Systems  Constitutional: Negative for chills and fever.  Respiratory: Negative for shortness of breath and wheezing.   Cardiovascular: Negative for chest pain and palpitations.  Gastrointestinal: Positive for abdominal pain, constipation, nausea and vomiting.  Genitourinary: Negative for dysuria, frequency and urgency.  All other systems reviewed and are negative.   No family history on file.  Past Medical History:  Diagnosis Date  . Anxiety   . Chronic back pain   . COPD (chronic obstructive pulmonary disease) (HCC)     Past Surgical History:  Procedure Laterality Date  . BACK SURGERY    . ILEO LOOP NEOBLADDER      Social History:  reports that she has been smoking cigarettes. She has a 40.00 pack-year smoking history. She does not have any smokeless tobacco history on file. She reports that she does not drink alcohol.  No history on file for drug.  Allergies: No Known Allergies  (Not in a hospital admission)   Blood pressure (!) 113/103, pulse 90, temperature 98.4 F (36.9 C), temperature source Oral, resp. rate (!) 24, SpO2 97 %. Physical Exam: Physical Exam Constitutional:      General: She is not in acute distress.    Appearance: She is cachectic.  HENT:     Head: Normocephalic and atraumatic.     Right Ear: External ear normal.     Left Ear: External ear normal.     Nose: Nose normal.     Mouth/Throat:     Lips: Pink.     Mouth: Mucous membranes are moist.  Eyes:     General: Lids are normal. No scleral icterus.    Extraocular Movements: Extraocular movements intact.     Conjunctiva/sclera: Conjunctivae normal.  Neck:     Musculoskeletal: Normal range of motion and neck supple.  Cardiovascular:     Rate and Rhythm: Normal rate and regular rhythm.     Pulses:          Radial pulses are 2+ on the right side and 2+ on the left side.       Dorsalis pedis pulses are 2+ on the right side and 2+ on the left side.  Pulmonary:     Effort: Pulmonary effort is normal.     Breath sounds: No decreased breath sounds (bilateral ).  Abdominal:     General: A surgical scar is present. Bowel sounds are decreased. There is distension.     Palpations: Abdomen is soft.  Tenderness: There is abdominal tenderness (mild) in the periumbilical area and suprapubic area. There is no guarding or rebound.     Hernia: No hernia is present.  Musculoskeletal:     Right lower leg: No edema.     Left lower leg: No edema.     Comments: ROM grossly intact in bilateral upper and lower extremities.  Skin:    General: Skin is warm and dry.     Findings: No rash.  Neurological:     Mental Status: She is alert and oriented to person, place, and time.  Psychiatric:        Behavior: Behavior is cooperative.     Results for orders placed or performed during the hospital encounter of 04/28/19 (from the past 48  hour(s))  Urinalysis, Routine w reflex microscopic     Status: Abnormal   Collection Time: 04/28/19  2:30 AM  Result Value Ref Range   Color, Urine YELLOW YELLOW   APPearance HAZY (A) CLEAR   Specific Gravity, Urine 1.012 1.005 - 1.030   pH 7.0 5.0 - 8.0   Glucose, UA NEGATIVE NEGATIVE mg/dL   Hgb urine dipstick MODERATE (A) NEGATIVE   Bilirubin Urine NEGATIVE NEGATIVE   Ketones, ur NEGATIVE NEGATIVE mg/dL   Protein, ur 30 (A) NEGATIVE mg/dL   Nitrite NEGATIVE NEGATIVE   Leukocytes,Ua NEGATIVE NEGATIVE   RBC / HPF 6-10 0 - 5 RBC/hpf   WBC, UA 11-20 0 - 5 WBC/hpf   Bacteria, UA RARE (A) NONE SEEN   Squamous Epithelial / LPF 0-5 0 - 5   Mucus PRESENT    Non Squamous Epithelial 0-5 (A) NONE SEEN    Comment: Performed at [facility], [address] [place], [place] [address]  Lipase, blood     Status: None   Collection Time: 04/28/19  2:53 AM  Result Value Ref Range   Lipase 20 11 - 51 U/L    Comment: Performed at [facility], [address] [place], [place] [address]  Comprehensive metabolic panel     Status: Abnormal   Collection Time: 04/28/19  2:53 AM  Result Value Ref Range   Sodium 140 135 - 145 mmol/L   Potassium 3.9 3.5 - 5.1 mmol/L   Chloride 98 98 - 111 mmol/L   CO2 29 22 - 32 mmol/L   Glucose, Bld 114 (H) 70 - 99 mg/dL   BUN 13 8 - 23 mg/dL   Creatinine, Ser 0.981.01 (H) 0.44 - 1.00 mg/dL   Calcium 11.910.3 8.9 - 14.710.3 mg/dL   Total Protein 7.7 6.5 - 8.1 g/dL   Albumin 4.8 3.5 - 5.0 g/dL   AST 19 15 - 41 U/L   ALT 18 0 - 44 U/L   Alkaline Phosphatase 166 (H) 38 - 126 U/L   Total Bilirubin 0.7 0.3 - 1.2 mg/dL   GFR calc non Af Amer >60 >60 mL/min   GFR calc Af Amer >60 >60 mL/min   Anion gap 13 5 - 15    Comment: Performed at [facility], [address] [place], [place] [address]  CBC     Status: Abnormal   Collection Time: 04/28/19  2:53 AM  Result Value Ref Range   WBC 16.0 (H) 4.0 - 10.5 K/uL   RBC 4.33 3.87 - 5.11 MIL/uL   Hemoglobin 15.2  (H) 12.0 - 15.0 g/dL   HCT 21.343.4 08.636.0 - 57.846.0 %   MCV 100.2 (H) 80.0 - 100.0 fL  MCH 35.1 (H) 26.0 - 34.0 pg   MCHC 35.0 30.0 - 36.0 g/dL   RDW 12.9 11.5 - 15.5 %   Platelets 174 150 - 400 K/uL   nRBC 0.0 0.0 - 0.2 %    Comment: Performed at [facility], [place] [address] [place], [place] [address]  CBC with Differential/Platelet     Status: Abnormal   Collection Time: 04/28/19  2:53 AM  Result Value Ref Range   WBC 15.5 (H) 4.0 - 10.5 K/uL   RBC 4.37 3.87 - 5.11 MIL/uL   Hemoglobin 15.1 (H) 12.0 - 15.0 g/dL   HCT 44.5 36.0 - 46.0 %   MCV 101.8 (H) 80.0 - 100.0 fL   MCH 34.6 (H) 26.0 - 34.0 pg   MCHC 33.9 30.0 - 36.0 g/dL   RDW 13.1 11.5 - 15.5 %   Platelets 171 150 - 400 K/uL    Comment: REPEATED TO VERIFY SPECIMEN CHECKED FOR CLOTS    nRBC 0.0 0.0 - 0.2 %   Neutrophils Relative % 80 %   Neutro Abs 12.4 (H) 1.7 - 7.7 K/uL   Lymphocytes Relative 13 %   Lymphs Abs 2.1 0.7 - 4.0 K/uL   Monocytes Relative 6 %   Monocytes Absolute 0.9 0.1 - 1.0 K/uL   Eosinophils Relative 1 %   Eosinophils Absolute 0.1 0.0 - 0.5 K/uL   Basophils Relative 0 %   Basophils Absolute 0.1 0.0 - 0.1 K/uL   Immature Granulocytes 0 %   Abs Immature Granulocytes 0.06 0.00 - 0.07 K/uL    Comment: Performed at [facility], [place] [address], [place], [place] [address]  Magnesium     Status: None   Collection Time: 04/28/19  8:33 AM  Result Value Ref Range   Magnesium 2.3 1.7 - 2.4 mg/dL    Comment: Performed at [place] [address], [place], [place] [address]   Ct Abdomen Pelvis W Contrast  Result Date: 04/28/2019 CLINICAL DATA:  Abdominal pain, nausea, constipation EXAM: CT ABDOMEN AND PELVIS WITH CONTRAST TECHNIQUE: Multidetector CT imaging of the abdomen and pelvis was performed using the standard protocol following bolus administration of intravenous contrast. CONTRAST:  152mL OMNIPAQUE IOHEXOL 300 MG/ML  SOLN COMPARISON:  None. FINDINGS: Lower chest: Lung bases are clear.  Hepatobiliary: Liver is within normal limits. Gallbladder is unremarkable. No intrahepatic or extrahepatic ductal dilatation. Pancreas: Within normal limits. Spleen: Within normal limits. Adrenals/Urinary Tract: Adrenal glands are within normal limits. Kidneys are within normal limits.  No hydronephrosis. Cystectomy with orthotopic neobladder. Stomach/Bowel: Distended stomach. Multiple dilated loops of small bowel throughout the abdomen. Small bowel suture line in the right lower quadrant (series 3/image 59). Loop of small bowel distal to the suture is decompressed to the terminal ileum/ileocecal valve (series 3/images 46, 58, and 64), suggesting stricture. Left colon is decompressed. Appendix is not discretely visualized. Vascular/Lymphatic: No evidence of abdominal aortic aneurysm. Atherosclerotic calcifications of the abdominal aorta and branch vessels. No suspicious abdominopelvic lymphadenopathy. Reproductive: Status post hysterectomy. No adnexal masses. Other: No abdominopelvic ascites. Musculoskeletal: Degenerative changes the lower lumbar spine. Ray cage fusion at L4-5. IMPRESSION: High-grade partial or complete small bowel obstruction with stricture involving a loop of distal/terminal ileum in the right lower quadrant. Postsurgical changes related to cystectomy with orthotopic neobladder. Electronically Signed   By: Julian Hy M.D.   On: 04/28/2019 09:31      Assessment/Plan COPD Tobacco abuse  SBO - likely secondary to adhesive scar tissue  from prior surgeries - no peritonitis or indication for acute surgical intervention - insert NGT and allow decompression - start protocol tomorrow if doing well - ok to have suppository to try to stimulate bowel function distally - ok to clamp NGT to mobilize  Wells Guiles, [facility] Surgery 04/28/2019, 10:57 AM Please see Amion for pager number during day hours 7:00am-4:30pm

## 2019-04-28 NOTE — ED Provider Notes (Signed)
[facility] EMERGENCY DEPARTMENT Provider Note   CSN: [id number] Arrival date & time: 04/28/19  0217     History   Chief Complaint Chief Complaint  Patient presents with  . Constipation  . Abdominal Pain    HPI [name] is a 63 y.o. female with a history of irritable bowel and chronic constipation, neoplasm (bladder reconstruction several years ago), presented to emergency part of abdominal pain and constipation.  Patient reports that she felt she was getting constipated about 3 days ago.  She tried taking a course of Dulcolax and had very small bowel movement 2 days ago.  However she felt like she was still bloated.  Yesterday she drank a bottle of magnesium citrate, and was unable to have bowel movement.  She came to the emergency department early in the morning today.  She reports in route to the ER she vomited a few times, which she attributed to the magnesium citrate.  She has not vomited since then.  She says she has been constipated in the past but never this bad.  She denies any fevers or chills.  She denies any history of bowel obstruction that she is aware of.  She reports a past surgical history of one hysterectomy, as well as bladder reconstruction due to interstitial cystitis, which utilized a portion of her large bowel or colon.  She does still have bowel movements regularly.  She does not have an ostomy.  She reports she used to be on chronic pain medicine for her back, but has been completely off opioids for some time now.  She takes 0.5 mg of Xanax as needed for anxiety.     HPI  Past Medical History:  Diagnosis Date  . Anxiety   . Chronic back pain   . COPD (chronic obstructive pulmonary disease) [facility])     Patient Active Problem List   Diagnosis Date Noted  . SBO (small bowel obstruction) (HCC) 04/28/2019  . Acute respiratory failure (HCC) 08/17/2013  . COPD exacerbation (HCC) 08/16/2013    Past Surgical History:  Procedure  Laterality Date  . BACK SURGERY    . ILEO LOOP NEOBLADDER       OB History   No obstetric history on file.      Home Medications    Prior to Admission medications   Medication Sig Start Date End Date Taking? Authorizing Provider  albuterol (PROVENTIL HFA;VENTOLIN HFA) 108 (90 BASE) MCG/ACT inhaler Inhale 2 puffs into the lungs every 6 (six) hours as needed for wheezing or shortness of breath. 08/18/13  Yes Vassie Loll, MD  ALPRAZolam Prudy Feeler) 1 MG tablet Take 0.5-1 mg by mouth at bedtime as needed for sleep or anxiety. 03/12/19  Yes [provider]    Family History No family history on file.  Social History Social History   Tobacco Use  . Smoking status: Current Every Day Smoker    Packs/day: 1.00    Years: 40.00    Pack years: 40.00    Types: Cigarettes  Substance Use Topics  . Alcohol use: No  . Drug use: Not on file     Allergies   Patient has no known allergies.   Review of Systems Review of Systems  Constitutional: Negative for chills and fever.  Eyes: Negative for photophobia and visual disturbance.  Respiratory: Negative for cough and shortness of breath.   Cardiovascular: Negative for chest pain and palpitations.  Gastrointestinal: Positive for abdominal distention, abdominal pain, constipation, nausea and vomiting. Negative  for diarrhea.  Genitourinary: Negative for dysuria and hematuria.  Skin: Negative for pallor and rash.  Neurological: Negative for syncope and light-headedness.  Psychiatric/Behavioral: Negative for agitation and confusion.  All other systems reviewed and are negative.    Physical Exam Updated Vital Signs BP (!) 109/56 (BP Location: Left Arm)   Pulse 80   Temp 98.3 F (36.8 C) (Oral)   Resp 17   SpO2 91%   Physical Exam Vitals signs and nursing note reviewed.  Constitutional:      General: She is not in acute distress.    Appearance: She is well-developed.  HENT:     Head: Normocephalic and atraumatic.   Eyes:     Conjunctiva/sclera: Conjunctivae normal.  Neck:     Musculoskeletal: Neck supple.  Cardiovascular:     Rate and Rhythm: Normal rate and regular rhythm.     Heart sounds: No murmur.  Pulmonary:     Effort: Pulmonary effort is normal. No respiratory distress.     Breath sounds: Normal breath sounds.  Abdominal:     General: There is distension.     Palpations: Abdomen is soft.     Tenderness: There is abdominal tenderness in the right lower quadrant and suprapubic area. There is no right CVA tenderness, left CVA tenderness or rebound. Negative signs include Murphy's sign, Rovsing's sign and McBurney's sign.  Skin:    General: Skin is warm and dry.  Neurological:     Mental Status: She is alert.      ED Treatments / Results  Labs (all labs ordered are listed, but only abnormal results are displayed) Labs Reviewed  COMPREHENSIVE METABOLIC PANEL - Abnormal; Notable for the following components:      Result Value   Glucose, Bld 114 (*)    Creatinine, Ser 1.01 (*)    Alkaline Phosphatase 166 (*)    All other components within normal limits  CBC - Abnormal; Notable for the following components:   WBC 16.0 (*)    Hemoglobin 15.2 (*)    MCV 100.2 (*)    MCH 35.1 (*)    All other components within normal limits  URINALYSIS, ROUTINE W REFLEX MICROSCOPIC - Abnormal; Notable for the following components:   APPearance HAZY (*)    Hgb urine dipstick MODERATE (*)    Protein, ur 30 (*)    Bacteria, UA RARE (*)    Non Squamous Epithelial 0-5 (*)    All other components within normal limits  CBC WITH DIFFERENTIAL/PLATELET - Abnormal; Notable for the following components:   WBC 15.5 (*)    Hemoglobin 15.1 (*)    MCV 101.8 (*)    MCH 34.6 (*)    Neutro Abs 12.4 (*)    All other components within normal limits  SARS CORONAVIRUS 2 (TAT 6-24 HRS)  LIPASE, BLOOD  MAGNESIUM  HIV ANTIBODY (ROUTINE TESTING W REFLEX)  CBC  BASIC METABOLIC PANEL    EKG None  Radiology Ct  Abdomen Pelvis W Contrast  Result Date: 04/28/2019 CLINICAL DATA:  Abdominal pain, nausea, constipation EXAM: CT ABDOMEN AND PELVIS WITH CONTRAST TECHNIQUE: Multidetector CT imaging of the abdomen and pelvis was performed using the standard protocol following bolus administration of intravenous contrast. CONTRAST:  142mL OMNIPAQUE IOHEXOL 300 MG/ML  SOLN COMPARISON:  None. FINDINGS: Lower chest: Lung bases are clear. Hepatobiliary: Liver is within normal limits. Gallbladder is unremarkable. No intrahepatic or extrahepatic ductal dilatation. Pancreas: Within normal limits. Spleen: Within normal limits. Adrenals/Urinary Tract: Adrenal glands are within  normal limits. Kidneys are within normal limits.  No hydronephrosis. Cystectomy with orthotopic neobladder. Stomach/Bowel: Distended stomach. Multiple dilated loops of small bowel throughout the abdomen. Small bowel suture line in the right lower quadrant (series 3/image 59). Loop of small bowel distal to the suture is decompressed to the terminal ileum/ileocecal valve (series 3/images 46, 58, and 64), suggesting stricture. Left colon is decompressed. Appendix is not discretely visualized. Vascular/Lymphatic: No evidence of abdominal aortic aneurysm. Atherosclerotic calcifications of the abdominal aorta and branch vessels. No suspicious abdominopelvic lymphadenopathy. Reproductive: Status post hysterectomy. No adnexal masses. Other: No abdominopelvic ascites. Musculoskeletal: Degenerative changes the lower lumbar spine. Ray cage fusion at L4-5. IMPRESSION: High-grade partial or complete small bowel obstruction with stricture involving a loop of distal/terminal ileum in the right lower quadrant. Postsurgical changes related to cystectomy with orthotopic neobladder. Electronically Signed   By: Charline Bills M.D.   On: 04/28/2019 09:31   Dg Abd Portable 1v-small Bowel Protocol-position Verification  Result Date: 04/28/2019 CLINICAL DATA:  Nasogastric tube  placement. Small bowel obstruction. EXAM: PORTABLE ABDOMEN - 1 VIEW COMPARISON:  CT abdomen and pelvis April 28, 2019 FINDINGS: Nasogastric tube tip and side port are in the stomach. There is no bowel dilatation or air-fluid level to suggest bowel obstruction. There is moderate air in the stomach. Lung bases are clear. No free air. There is contrast in the renal collecting systems bilaterally. IMPRESSION: Nasogastric tube tip and side port in stomach. Bowel dilatation no longer evident. No free air. Lung bases clear. Electronically Signed   By: Bretta Bang III M.D.   On: 04/28/2019 12:03    Procedures Procedures (including critical care time)  Medications Ordered in ED Medications  sodium chloride flush (NS) 0.9 % injection 3 mL (has no administration in time range)  ondansetron (ZOFRAN) injection 4 mg (4 mg Intravenous Given 04/28/19 1032)  promethazine (PHENERGAN) injection 12.5 mg (has no administration in time range)  LORazepam (ATIVAN) injection 0.5 mg (has no administration in time range)  nicotine (NICODERM CQ - dosed in mg/24 hours) patch 21 mg (21 mg Transdermal Patch Applied 04/28/19 1316)  mometasone-formoterol (DULERA) 200-5 MCG/ACT inhaler 2 puff (2 puffs Inhalation Not Given 04/28/19 1300)  HYDROmorphone (DILAUDID) injection 1 mg (1 mg Intravenous Given 04/28/19 1327)  enoxaparin (LOVENOX) injection 40 mg (40 mg Subcutaneous Given 04/28/19 1316)  sodium chloride flush (NS) 0.9 % injection 3 mL (3 mLs Intravenous Not Given 04/28/19 1329)  acetaminophen (TYLENOL) tablet 650 mg (has no administration in time range)    Or  acetaminophen (TYLENOL) suppository 650 mg (has no administration in time range)  0.9 %  sodium chloride infusion ( Intravenous Stopped 04/28/19 1405)  albuterol (PROVENTIL) (2.5 MG/3ML) 0.083% nebulizer solution 2.5 mg (has no administration in time range)  umeclidinium bromide (INCRUSE ELLIPTA) 62.5 MCG/INH 1 puff (1 puff Inhalation Not Given 04/28/19  1301)  ondansetron (ZOFRAN-ODT) disintegrating tablet 4 mg (4 mg Oral Given 04/28/19 0232)  HYDROmorphone (DILAUDID) injection 1 mg (1 mg Intravenous Given 04/28/19 0847)  sodium chloride 0.9 % bolus 1,000 mL (0 mLs Intravenous Stopped 04/28/19 1032)  iohexol (OMNIPAQUE) 300 MG/ML solution 100 mL (100 mLs Intravenous Contrast Given 04/28/19 0858)  bisacodyl (DULCOLAX) suppository 10 mg (10 mg Rectal Given 04/28/19 1316)     Initial Impression / Assessment and Plan / ED Course  I have reviewed the triage vital signs and the nursing notes.  Pertinent labs & imaging results that were available during my care of the patient were reviewed by me  and considered in my medical decision making (see chart for details).  63 year old female presented emergency department with abdominal distention, cramping abdominal pain, inability to pass gas and difficulty having bowel movements for 2 days.  Her history is most consistent with ileus or bowel obstruction.  She is not having active vomiting.  He does feel extremely nauseated does not feel like she can tolerate p.o. contrast.  Will obtain a CT scan with IV contrast.  Her intake labs are notable for leukocytosis of 16, no differential was sent, will attempt to add one on.  However this raises the possibility of an inflammatory infectious process such as diverticulitis.  CT scan will also assess for this.  I have a lower suspicion for sepsis at this time.  Have a very low suspicion for AAA.  No focal RUQ ttp to suggest biliary disease.  She has no urinary symptoms. UA negative for nitrites and leuks - lower suspicion for UTI.  Do not think pyelonephritis at this time.    Clinical Course as of Apr 27 1799  Wed Apr 28, 2019  0935 IMPRESSION: High-grade partial or complete small bowel obstruction with stricture involving a loop of distal/terminal ileum in the right lower quadrant.  Postsurgical changes related to cystectomy with orthotopic neobladder.    [MT]  1013 General surgery PA called back.  They will follow in consultation, but she reported that bowel obstruction patients will be admitted to the hospitalist per an ongoing agreement between their teams. I paged the hospitalist   [MT]    Clinical Course User Index [MT] Nakima Fluegge, Kermit BaloMatthew J, MD   This note was dictated using dragon dictation software.  Please be aware that there may be minor translation errors as a result of this oral dictation     Final Clinical Impressions(s) / ED Diagnoses   Final diagnoses:  Encounter for imaging study to confirm nasogastric (NG) tube placement  Small bowel obstruction (HCC)  Small bowel obstruction (HCC)  SBO (small bowel obstruction) [facility](HCC)    ED Discharge Orders    None       Terald Sleeperrifan, Dereona Kolodny J, MD 04/28/19 1800

## 2019-04-28 NOTE — ED Triage Notes (Signed)
Pt states she hasnt had a BM in 3 days, c.o severe abd pain and nausea. Took mag citrate last night without relief. Pt seems uncomfortable in triage

## 2019-04-29 ENCOUNTER — Inpatient Hospital Stay (HOSPITAL_COMMUNITY): Payer: Medicare Other

## 2019-04-29 DIAGNOSIS — K56609 Unspecified intestinal obstruction, unspecified as to partial versus complete obstruction: Secondary | ICD-10-CM | POA: Diagnosis not present

## 2019-04-29 LAB — BASIC METABOLIC PANEL
Anion gap: 10 (ref 5–15)
BUN: 13 mg/dL (ref 8–23)
CO2: 26 mmol/L (ref 22–32)
Calcium: 8.8 mg/dL — ABNORMAL LOW (ref 8.9–10.3)
Chloride: 107 mmol/L (ref 98–111)
Creatinine, Ser: 0.68 mg/dL (ref 0.44–1.00)
GFR calc Af Amer: >60 mL/min (ref >60)
GFR calc non Af Amer: >60 mL/min (ref >60)
Glucose, Bld: 76 mg/dL (ref 70–99)
Potassium: 4 mmol/L (ref 3.5–5.1)
Sodium: 143 mmol/L (ref 135–145)

## 2019-04-29 LAB — CBC
HCT: 33.6 % — ABNORMAL LOW (ref 36.0–46.0)
Hemoglobin: 11.4 g/dL — ABNORMAL LOW (ref 12.0–15.0)
MCH: 34.4 pg — ABNORMAL HIGH (ref 26.0–34.0)
MCHC: 33.9 g/dL (ref 30.0–36.0)
MCV: 101.5 fL — ABNORMAL HIGH (ref 80.0–100.0)
Platelets: 104 10*3/uL — ABNORMAL LOW (ref 150–400)
RBC: 3.31 MIL/uL — ABNORMAL LOW (ref 3.87–5.11)
RDW: 13.2 % (ref 11.5–15.5)
WBC: 7.2 10*3/uL (ref 4.0–10.5)
nRBC: 0 % (ref 0.0–0.2)

## 2019-04-29 LAB — OCCULT BLOOD GASTRIC / DUODENUM (SPECIMEN CUP): Occult Blood, Gastric: POSITIVE — AB

## 2019-04-29 LAB — HIV ANTIBODY (ROUTINE TESTING W REFLEX): HIV Screen 4th Generation wRfx: NONREACTIVE — AB

## 2019-04-29 MED ORDER — PANTOPRAZOLE SODIUM 40 MG IV SOLR
40.0000 mg | Freq: Two times a day (BID) | INTRAVENOUS | Status: DC
  Administered 2019-04-29 – 2019-05-02 (×7): 40 mg via INTRAVENOUS
  Filled 2019-04-29 (×7): qty 40

## 2019-04-29 MED ORDER — PHENOL 1.4 % MT LIQD
1.0000 | OROMUCOSAL | Status: DC | PRN
  Administered 2019-04-29: 1 via OROMUCOSAL
  Filled 2019-04-29 (×2): qty 177

## 2019-04-29 MED ORDER — DIATRIZOATE MEGLUMINE & SODIUM 66-10 % PO SOLN
90.0000 mL | Freq: Once | ORAL | Status: AC
  Administered 2019-04-29: 90 mL via NASOGASTRIC
  Filled 2019-04-29: qty 90

## 2019-04-29 NOTE — Progress Notes (Signed)
PROGRESS NOTE    Patient: [name]                            PCP: Patient, No Pcp Per                    DOB: 1955-10-30            DOA: 04/28/2019 MOL:[id number]             DOS: 04/29/2019, 10:44 AM   LOS: 1 day   Date of Service: The patient was seen and examined on 04/29/2019  Subjective:   The patient was seen and examined this morning still reporting abdominal pain, NG tube in place.  Patient has found some relief from pain medication, reporting feeling nausea. Reporting her belly is much softer now than last night. Reporting of no gas or bowel movements  Brief Narrative:   [name] is a 63 y.o. female with medical history significant of COPD, anxiety, chronic pain, and tobacco abuse.  She reports having previous surgeries include vaginal hysterectomy, ileal loop neobladder for interstitial cystitis, and fistula repairs.  She presents with complaints of 4 days of abdominal pain and constipation.    Complaining of persistent pain, nausea, vomiting, small bowel movement 2 days ago.  Emesis nonbloody nonbilious History of multiple previous abdominal surgeries. Continues to smoke cigarettes 1 pack/day  On admission denied having any significant fevers, chest pain, dysuria, or daily alcohol use.  ED Course:  On arrival febrile, tachycardic, tachypneic WBC 15.1, hemoglobin 15.1, creatinine 1.01   CT scan of the abdomen revealed a high-grade partial or complete small bowel obstruction with stricture involving loop of the distal/terminal ileum in the right lower quadrant.   General surgery was formally consulted.  Nasogastric tube was placed to suction.  COVID-19 screening was negative.  She was given 1 L normal saline IV fluids, Zofran, and Dilaudid for pain.  [facility] called to admit.    Assessment & Plan:   Principal Problem:   SBO (small bowel obstruction) (HCC) Active Problems:   COPD (chronic obstructive pulmonary disease) (HCC)   SIRS (systemic inflammatory  response syndrome) (HCC)   Tobacco abuse   Chronic pain   AKI (acute kidney injury) ([place])    Small bowel obstruction: -Persistent abdominal pain some relief from pain medication, improved nausea, vomiting NG tube in place suction to the wall -CT abdomen pelvis reviewed.  High-grade partial or complete small bowel obstruction with stricture. -N.p.o.  -Continue NGT to suction -Antiemetics as needed -IV Dilaudid as needed for pain -Check abdominal x-ray i -Appreciate general surgery consultative services,   SIRS: -On admission met SIRS criteria, low-grade fever, tachycardic, tachypneic, with WBC of 16.0  >> 7.2 -This morning stable temp 98.3, pulse 84, respiratory rate 18 blood pressure 100/94, satting 94% on room air -No antibiotics initiated, -Suspecting acute reaction to above,    Suspect acute kidney injury: Patient presents with creatinine 1.01>>>0.68 today  -Baseline previously noted to be around 0.5. -We will continue it normal saline IV fluids at 75 mL/h -Monitor renal function closely  COPD, without acute exacerbation -As needed O2 supplements, -Continue home inhalers Continue pharmacy substitution of Dulera for Symbicort and Spiriva   Chronic pain:  Patient reports having a long history of back pain from previous surgeries.  She is on oral hydromorphone at home. -Substitution for IV Dilaudid while n.p.o.  Tobacco abuse: Patient still reports smoking tobacco on  a regular basis. -Patient was advised strongly on smoking cessation, NicoDerm patch has been provided   DVT prophylaxis: Lovenox  Code Status: Full Family Communication: No family present at bedside Disposition Plan: Likely discharge home once bowel obstruction resolved Consults called: Surgery Admission status: Inpatient     Procedures:   No admission procedures for hospital encounter.     Antimicrobials:  Anti-infectives (From admission, onward)   None       Medication:  .  enoxaparin (LOVENOX) injection  40 mg Subcutaneous Q24H  . mometasone-formoterol  2 puff Inhalation BID  . nicotine  21 mg Transdermal Daily  . sodium chloride flush  3 mL Intravenous Once  . sodium chloride flush  3 mL Intravenous Q12H  . umeclidinium bromide  1 puff Inhalation Daily    acetaminophen **OR** acetaminophen, albuterol, HYDROmorphone (DILAUDID) injection, LORazepam, ondansetron (ZOFRAN) IV, phenol, promethazine   Objective:   Vitals:   04/28/19 2110 04/28/19 2129 04/29/19 0445 04/29/19 0746  BP: 109/61  (!) 100/54   Pulse: 76  85 84  Resp: _0 Temp: 97.7 F (36.5 C)  98.3 F (36.8 C)   TempSrc: Oral  Oral   SpO2: 95% 95% (!) 83% 94%    Intake/Output Summary (Last 24 hours) at 04/29/2019 1044 Last data filed at 04/29/2019 0856 Gross per 24 hour  Intake 430.71 ml  Output 1400 ml  Net -969.29 ml   There were no vitals filed for this visit.   Examination:   Physical Exam  Constitution:  Alert, cooperative, no distress,  Appears calm and comfortable  Psychiatric: Normal and stable mood and affect, cognition intact,   HEENT: Normocephalic, PERRL, otherwise with in Normal limits -NG tube in place Chest:Chest symmetric Cardio vascular:  S1/S2, RRR, No murmure, No Rubs or Gallops  pulmonary: Clear to auscultation bilaterally, respirations unlabored, negative wheezes / crackles Abdomen: Soft, diffusely tender, non-distended, hypoactive bowel sounds,,no masses, no organomegaly Muscular skeletal: Limited exam - in bed, able to move all 4 extremities, Normal strength,  Neuro: CNII-XII intact. ,  Speech intact, sensorimotor intact Extremities: No pitting edema lower extremities, +2 pulses  Skin: Dry, warm to touch, negative for any Rashes, No open wounds Wounds: per nursing documentation  LABs:  CBC Latest Ref Rng & Units 04/29/2019 04/28/2019 04/28/2019  WBC 4.0 - 10.5 K/uL 7.2 15.5(H) 16.0(H)  Hemoglobin 12.0 - 15.0 g/dL 11.4(L) 15.1(H) 15.2(H)   Hematocrit 36.0 - 46.0 % 33.6(L) 44.5 43.4  Platelets 150 - 400 K/uL 104(L) 171 174   CMP Latest Ref Rng & Units 04/29/2019 04/28/2019 08/18/2013  Glucose 70 - 99 mg/dL 76 114(H) 112(H)  BUN 8 - 23 mg/dL _1 Creatinine 0.44 - 1.00 mg/dL 0.68 1.01(H) 0.76  Sodium 135 - 145 mmol/L 143 140 136(L)  Potassium 3.5 - 5.1 mmol/L 4.0 3.9 3.8  Chloride 98 - 111 mmol/L 107 98 99  CO2 22 - 32 mmol/L _2 Calcium 8.9 - 10.3 mg/dL 8.8(L) 10.3 8.7  Total Protein 6.5 - 8.1 g/dL - 7.7 7.1  Total Bilirubin 0.3 - 1.2 mg/dL - 0.7 0.3  Alkaline Phos 38 - 126 U/L - 166(H) 54  AST 15 - 41 U/L - 19 16  ALT 0 - 44 U/L - 18 13        SIGNED: Deatra James, MD, FACP, FHM. [facility],  Pager [phone]  If 7PM-7AM, please contact night-coverage [email or web address] 04/29/2019, 10:44 AM

## 2019-04-29 NOTE — Plan of Care (Signed)
  Problem: Education: Goal: Knowledge of General Education information will improve Description Including pain rating scale, medication(s)/side effects and non-pharmacologic comfort measures Outcome: Progressing   

## 2019-04-29 NOTE — Progress Notes (Signed)
Subjective/Chief Complaint: Reports much less abdominal pain No flatus   Objective: Vital signs in last 24 hours: Temp:  [97.7 F (36.5 C)-98.6 F (37 C)] 98.3 F (36.8 C) (11/19 0445) Pulse Rate:  [73-90] 85 (11/19 0445) Resp:  [16-25] 18 (11/19 0445) BP: (100-122)/(54-103) 100/54 (11/19 0445) SpO2:  [83 %-97 %] 83 % (11/19 0445) Last BM Date: 04/25/19  Intake/Output from previous day: 11/18 0701 - 11/19 0700 In: 430.7 [I.V.:430.7] Out: 1400 [Emesis/NG output:1400] Intake/Output this shift: No intake/output data recorded.  Exam: Awake and alert Abdomen softer, much less distension, non-tender  Lab Results:  Recent Labs    04/28/19 0253 04/29/19 0308  WBC 15.5*  16.0* 7.2  HGB 15.1*  15.2* 11.4*  HCT 44.5  43.4 33.6*  PLT 171  174 104*   BMET Recent Labs    04/28/19 0253 04/29/19 0308  NA 140 143  K 3.9 4.0  CL 98 107  CO2 29 26  GLUCOSE 114* 76  BUN 13 13  CREATININE 1.01* 0.68  CALCIUM 10.3 8.8*   PT/INR No results for input(s): LABPROT, INR in the last 72 hours. ABG No results for input(s): PHART, HCO3 in the last 72 hours.  Invalid input(s): PCO2, PO2  Studies/Results: Ct Abdomen Pelvis W Contrast  Result Date: 04/28/2019 CLINICAL DATA:  Abdominal pain, nausea, constipation EXAM: CT ABDOMEN AND PELVIS WITH CONTRAST TECHNIQUE: Multidetector CT imaging of the abdomen and pelvis was performed using the standard protocol following bolus administration of intravenous contrast. CONTRAST:  12mL OMNIPAQUE IOHEXOL 300 MG/ML  SOLN COMPARISON:  None. FINDINGS: Lower chest: Lung bases are clear. Hepatobiliary: Liver is within normal limits. Gallbladder is unremarkable. No intrahepatic or extrahepatic ductal dilatation. Pancreas: Within normal limits. Spleen: Within normal limits. Adrenals/Urinary Tract: Adrenal glands are within normal limits. Kidneys are within normal limits.  No hydronephrosis. Cystectomy with orthotopic neobladder. Stomach/Bowel:  Distended stomach. Multiple dilated loops of small bowel throughout the abdomen. Small bowel suture line in the right lower quadrant (series 3/image 59). Loop of small bowel distal to the suture is decompressed to the terminal ileum/ileocecal valve (series 3/images 46, 58, and 64), suggesting stricture. Left colon is decompressed. Appendix is not discretely visualized. Vascular/Lymphatic: No evidence of abdominal aortic aneurysm. Atherosclerotic calcifications of the abdominal aorta and branch vessels. No suspicious abdominopelvic lymphadenopathy. Reproductive: Status post hysterectomy. No adnexal masses. Other: No abdominopelvic ascites. Musculoskeletal: Degenerative changes the lower lumbar spine. Ray cage fusion at L4-5. IMPRESSION: High-grade partial or complete small bowel obstruction with stricture involving a loop of distal/terminal ileum in the right lower quadrant. Postsurgical changes related to cystectomy with orthotopic neobladder. Electronically Signed   By: Julian Hy M.D.   On: 04/28/2019 09:31   Dg Abd Portable 1v-small Bowel Protocol-position Verification  Result Date: 04/28/2019 CLINICAL DATA:  Nasogastric tube placement. Small bowel obstruction. EXAM: PORTABLE ABDOMEN - 1 VIEW COMPARISON:  CT abdomen and pelvis April 28, 2019 FINDINGS: Nasogastric tube tip and side port are in the stomach. There is no bowel dilatation or air-fluid level to suggest bowel obstruction. There is moderate air in the stomach. Lung bases are clear. No free air. There is contrast in the renal collecting systems bilaterally. IMPRESSION: Nasogastric tube tip and side port in stomach. Bowel dilatation no longer evident. No free air. Lung bases clear. Electronically Signed   By: Lowella Grip III M.D.   On: 04/28/2019 12:03    Anti-infectives: Anti-infectives (From admission, onward)   None      Assessment/Plan: Small bowel  obstruction  Films today look like gas in the colon. Will order the SBO  protocol films WBC normal Continue NPO and NG  LOS: 1 day    Abigail Miyamoto 04/29/2019

## 2019-04-30 ENCOUNTER — Encounter (HOSPITAL_COMMUNITY): Payer: Self-pay

## 2019-04-30 DIAGNOSIS — K56609 Unspecified intestinal obstruction, unspecified as to partial versus complete obstruction: Secondary | ICD-10-CM | POA: Diagnosis not present

## 2019-04-30 LAB — CBC
HCT: 38.1 % (ref 36.0–46.0)
Hemoglobin: 12.3 g/dL (ref 12.0–15.0)
MCH: 34.4 pg — ABNORMAL HIGH (ref 26.0–34.0)
MCHC: 32.3 g/dL (ref 30.0–36.0)
MCV: 106.4 fL — ABNORMAL HIGH (ref 80.0–100.0)
Platelets: 120 10*3/uL — ABNORMAL LOW (ref 150–400)
RBC: 3.58 MIL/uL — ABNORMAL LOW (ref 3.87–5.11)
RDW: 13.8 % (ref 11.5–15.5)
WBC: 9.4 10*3/uL (ref 4.0–10.5)
nRBC: 0 % (ref 0.0–0.2)

## 2019-04-30 LAB — BASIC METABOLIC PANEL
Anion gap: 14 (ref 5–15)
BUN: 18 mg/dL (ref 8–23)
CO2: 24 mmol/L (ref 22–32)
Calcium: 9.2 mg/dL (ref 8.9–10.3)
Chloride: 110 mmol/L (ref 98–111)
Creatinine, Ser: 0.99 mg/dL (ref 0.44–1.00)
GFR calc Af Amer: >60 mL/min (ref >60)
GFR calc non Af Amer: >60 mL/min (ref >60)
Glucose, Bld: 83 mg/dL (ref 70–99)
Potassium: 4.1 mmol/L (ref 3.5–5.1)
Sodium: 148 mmol/L — ABNORMAL HIGH (ref 135–145)

## 2019-04-30 MED ORDER — DOCUSATE SODIUM 50 MG/5ML PO LIQD
100.0000 mg | Freq: Two times a day (BID) | ORAL | Status: DC
  Administered 2019-04-30 – 2019-05-01 (×4): 100 mg via ORAL
  Filled 2019-04-30 (×4): qty 10

## 2019-04-30 MED ORDER — SENNOSIDES 8.8 MG/5ML PO SYRP
5.0000 mL | ORAL_SOLUTION | Freq: Once | ORAL | Status: AC
  Administered 2019-04-30: 5 mL via ORAL
  Filled 2019-04-30: qty 5

## 2019-04-30 NOTE — Progress Notes (Signed)
PROGRESS NOTE    Patient: [name]                            PCP: Patient, No Pcp Per                    DOB: 19-Jun-1955            DOA: 04/28/2019 [id number]             DOS: 04/30/2019, 11:36 AM   LOS: 2 days   Date of Service: The patient was seen and examined on 04/30/2019  Subjective:   The patient was seen and examined this morning, stable no acute distress, NG tube was DC'd already earlier this morning by surgery. Patient is optimistic as surgery recommended initiating clear liquid diet today.  She is reporting improved nausea vomiting abdominal pain... Willing to initiate p.o. intake No issues overnight Remained stable Still not reporting of gas or bowel movements yet.   Brief Narrative:   [name] is a 63 y.o. female with medical history significant of COPD, anxiety, chronic pain, and tobacco abuse.  She reports having previous surgeries include vaginal hysterectomy, ileal loop neobladder for interstitial cystitis, and fistula repairs.  She presents with complaints of 4 days of abdominal pain and constipation.    Complaining of persistent pain, nausea, vomiting, small bowel movement 2 days ago.  Emesis nonbloody nonbilious History of multiple previous abdominal surgeries. Continues to smoke cigarettes 1 pack/day  On admission denied having any significant fevers, chest pain, dysuria, or daily alcohol use.  ED Course:  On arrival febrile, tachycardic, tachypneic WBC 15.1, hemoglobin 15.1, creatinine 1.01   CT scan of the abdomen revealed a high-grade partial or complete small bowel obstruction with stricture involving loop of the distal/terminal ileum in the right lower quadrant.   General surgery was formally consulted.  Nasogastric tube was placed to suction.  COVID-19 screening was negative.  She was given 1 L normal saline IV fluids, Zofran, and Dilaudid for pain.  TRH called to admit.   04/29/2019 -NG tube in place, hypoactive bowel sounds, no bowel  movements or gas General surgery following -recommending conservative management Continue to be n.p.o., on IV fluids  04/30/2019 -abdominal series revealed contrast in colon, general surgery has DC'd NG tube, initiating encouraging clear liquid diet today.  Assessment & Plan:   Principal Problem:   SBO (small bowel obstruction) (HCC) Active Problems:   COPD (chronic obstructive pulmonary disease) (HCC)   SIRS (systemic inflammatory response syndrome) (HCC)   Tobacco abuse   Chronic pain   AKI (acute kidney injury) ([place])    Small bowel obstruction: -Improved abdominal pain, nausea vomiting -Awaiting bowel function, no reported gas or substantial BM yet  -CT abdomen pelvis reviewed.  High-grade partial or complete small bowel obstruction with stricture. - NGT to suction >>> DC'd 04/30/2019 -Antiemetics as needed -IV Dilaudid as needed for pain -Check abdominal series >> revealing contrast in colon  -General surgery following-conservative management, DC NG tube, recommending initiating clear liquid diet today  -Advancing to clear liquid diet today,   SIRS: -Likely reactive to above, much improved, stable -On admission met SIRS criteria, low-grade fever, tachycardic, tachypneic, with WBC of 16.0  >> 7.2 -This morning stable temp 98.3, pulse 84, respiratory rate 18 blood pressure 100/94, satting 94% on room air -No antibiotics initiated, -Suspecting acute reaction to above,    Suspect acute kidney injury: Patient presents  with creatinine 1.01>>>0.68 today  -Baseline previously noted to be around 0.5. -We will continue it normal saline IV fluids at 75 mL/h -Monitor renal function closely  COPD, without acute exacerbation -As needed O2 supplements, -Continue home inhalers Continue pharmacy substitution of Dulera for Symbicort and Spiriva   Chronic pain:  Patient reports having a long history of back pain from previous surgeries.  She is on oral hydromorphone at home.  -Substitution for IV Dilaudid while n.p.o.  Tobacco abuse: Patient still reports smoking tobacco on a regular basis. -Patient was advised strongly on smoking cessation, NicoDerm patch has been provided   DVT prophylaxis: Lovenox  Code Status: Full Family Communication: No family present at bedside Disposition Plan: Likely discharge home once bowel function returns, likely in a.m. 05/01/2019  Consults called: Surgery Admission status: Inpatient     Procedures:   No admission procedures for hospital encounter.     Antimicrobials:  Anti-infectives (From admission, onward)   None       Medication:  . docusate  100 mg Oral BID  . enoxaparin (LOVENOX) injection  40 mg Subcutaneous Q24H  . mometasone-formoterol  2 puff Inhalation BID  . nicotine  21 mg Transdermal Daily  . pantoprazole (PROTONIX) IV  40 mg Intravenous Q12H  . sennosides  5 mL Oral Once  . sodium chloride flush  3 mL Intravenous Once  . sodium chloride flush  3 mL Intravenous Q12H  . umeclidinium bromide  1 puff Inhalation Daily    acetaminophen **OR** acetaminophen, albuterol, HYDROmorphone (DILAUDID) injection, LORazepam, ondansetron (ZOFRAN) IV, phenol, promethazine   Objective:   Vitals:   04/29/19 2059 04/29/19 2225 04/30/19 0432 04/30/19 0859  BP:  (!) 118/57 (!) 120/55   Pulse:  77 65   Resp:  (!) 22 18   Temp:  97.8 F (36.6 C) 98.2 F (36.8 C)   TempSrc:  Oral Oral   SpO2: 98% 100% 97% 96%    Intake/Output Summary (Last 24 hours) at 04/30/2019 1136 Last data filed at 04/30/2019 0903 Gross per 24 hour  Intake 1129.93 ml  Output 301 ml  Net 828.93 ml   There were no vitals filed for this visit.   Examination:  BP (!) 120/55   Pulse 65   Temp 98.2 F (36.8 C) (Oral)   Resp 18   SpO2 96%    Physical Exam  Constitution:  Alert, cooperative, no distress,  Psychiatric: Normal and stable mood and affect, cognition intact,   HEENT: Normocephalic, PERRL, otherwise with in  Normal limits  Chest:Chest symmetric Cardio vascular:  S1/S2, RRR, No murmure, No Rubs or Gallops  pulmonary: Clear to auscultation bilaterally, respirations unlabored, negative wheezes / crackles Abdomen: Soft, mild diffuse tenderness,, non-distended, hypoactive bowel sounds, no masses, no organomegaly Muscular skeletal: Limited exam - in bed, able to move all 4 extremities, Normal strength,  Neuro: CNII-XII intact. , normal motor and sensation, reflexes intact  Extremities: No pitting edema lower extremities, +2 pulses  Skin: Dry, warm to touch, negative for any Rashes, No open wounds Wounds: per nursing documentation     LABs:  CBC Latest Ref Rng & Units 04/30/2019 04/29/2019 04/28/2019  WBC 4.0 - 10.5 K/uL 9.4 7.2 15.5(H)  Hemoglobin 12.0 - 15.0 g/dL 12.3 11.4(L) 15.1(H)  Hematocrit 36.0 - 46.0 % 38.1 33.6(L) 44.5  Platelets 150 - 400 K/uL 120(L) 104(L) 171   CMP Latest Ref Rng & Units 04/30/2019 04/29/2019 04/28/2019  Glucose 70 - 99 mg/dL 83 76 114(H)  BUN 8 -  23 mg/dL '18 13 13  ' Creatinine 0.44 - 1.00 mg/dL 0.99 0.68 1.01(H)  Sodium 135 - 145 mmol/L 148(H) 143 140  Potassium 3.5 - 5.1 mmol/L 4.1 4.0 3.9  Chloride 98 - 111 mmol/L 110 107 98  CO2 22 - 32 mmol/L '24 26 29  ' Calcium 8.9 - 10.3 mg/dL 9.2 8.8(L) 10.3  Total Protein 6.5 - 8.1 g/dL - - 7.7  Total Bilirubin 0.3 - 1.2 mg/dL - - 0.7  Alkaline Phos 38 - 126 U/L - - 166(H)  AST 15 - 41 U/L - - 19  ALT 0 - 44 U/L - - 18     SIGNED: Deatra James, MD, FACP, FHM. [facility],  Pager [phone]  If 7PM-7AM, please contact night-coverage [email or web address] Ota [facility] 04/30/2019, 11:36 AM

## 2019-04-30 NOTE — Plan of Care (Signed)
  Problem: Education: Goal: Knowledge of General Education information will improve Description Including pain rating scale, medication(s)/side effects and non-pharmacologic comfort measures Outcome: Progressing   

## 2019-04-30 NOTE — Progress Notes (Signed)
Subjective/Chief Complaint: No complaints Passing flatus, had BM   Objective: Vital signs in last 24 hours: Temp:  [97.8 F (36.6 C)-98.2 F (36.8 C)] 98.2 F (36.8 C) (11/20 0432) Pulse Rate:  [65-85] 65 (11/20 0432) Resp:  [18-22] 18 (11/20 0432) BP: (104-120)/(55-69) 120/55 (11/20 0432) SpO2:  [91 %-100 %] 97 % (11/20 0432) Last BM Date: 04/29/19  Intake/Output from previous day: 11/19 0701 - 11/20 0700 In: 646.9 [I.V.:646.9] Out: 301 [Emesis/NG output:300; Stool:1] Intake/Output this shift: No intake/output data recorded.  Exam: Awake and alert Abdomen soft, NT/ND  Lab Results:  Recent Labs    04/29/19 0308 04/30/19 0216  WBC 7.2 9.4  HGB 11.4* 12.3  HCT 33.6* 38.1  PLT 104* 120*   BMET Recent Labs    04/29/19 0308 04/30/19 0216  NA 143 148*  K 4.0 4.1  CL 107 110  CO2 26 24  GLUCOSE 76 83  BUN 13 18  CREATININE 0.68 0.99  CALCIUM 8.8* 9.2   PT/INR No results for input(s): LABPROT, INR in the last 72 hours. ABG No results for input(s): PHART, HCO3 in the last 72 hours.  Invalid input(s): PCO2, PO2  Studies/Results: Ct Abdomen Pelvis W Contrast  Result Date: 04/28/2019 CLINICAL DATA:  Abdominal pain, nausea, constipation EXAM: CT ABDOMEN AND PELVIS WITH CONTRAST TECHNIQUE: Multidetector CT imaging of the abdomen and pelvis was performed using the standard protocol following bolus administration of intravenous contrast. CONTRAST:  137mL OMNIPAQUE IOHEXOL 300 MG/ML  SOLN COMPARISON:  None. FINDINGS: Lower chest: Lung bases are clear. Hepatobiliary: Liver is within normal limits. Gallbladder is unremarkable. No intrahepatic or extrahepatic ductal dilatation. Pancreas: Within normal limits. Spleen: Within normal limits. Adrenals/Urinary Tract: Adrenal glands are within normal limits. Kidneys are within normal limits.  No hydronephrosis. Cystectomy with orthotopic neobladder. Stomach/Bowel: Distended stomach. Multiple dilated loops of small bowel  throughout the abdomen. Small bowel suture line in the right lower quadrant (series 3/image 59). Loop of small bowel distal to the suture is decompressed to the terminal ileum/ileocecal valve (series 3/images 46, 58, and 64), suggesting stricture. Left colon is decompressed. Appendix is not discretely visualized. Vascular/Lymphatic: No evidence of abdominal aortic aneurysm. Atherosclerotic calcifications of the abdominal aorta and branch vessels. No suspicious abdominopelvic lymphadenopathy. Reproductive: Status post hysterectomy. No adnexal masses. Other: No abdominopelvic ascites. Musculoskeletal: Degenerative changes the lower lumbar spine. Ray cage fusion at L4-5. IMPRESSION: High-grade partial or complete small bowel obstruction with stricture involving a loop of distal/terminal ileum in the right lower quadrant. Postsurgical changes related to cystectomy with orthotopic neobladder. Electronically Signed   By: Julian Hy M.D.   On: 04/28/2019 09:31   Dg Abd Portable 1v-small Bowel Obstruction Protocol-initial, 8 Hr Delay  Result Date: 04/29/2019 CLINICAL DATA:  Small-bowel obstruction 8 hour delay EXAM: PORTABLE ABDOMEN - 1 VIEW COMPARISON:  04/29/2019, 04/28/2019 FINDINGS: Esophageal tube has been withdrawn, the tip is now at the distal esophagus. Radiopaque contrast within the colon. No dilated small bowel is seen. Postsurgical changes in the lower lumbar spine IMPRESSION: 1. Nonobstructed gas pattern with contrast material in the colon 2. Esophageal tube tip at the distal esophagus, further advancement recommended for more optimal position These results will be called to the ordering clinician or representative by the Radiologist Assistant, and communication documented in the PACS or zVision Dashboard. Electronically Signed   By: Donavan Foil M.D.   On: 04/29/2019 19:18   Dg Abd Portable 1v  Result Date: 04/29/2019 CLINICAL DATA:  63 year old female with small bowel  obstruction EXAM:  PORTABLE ABDOMEN - 1 VIEW COMPARISON:  04/28/2019 FINDINGS: Gastric tube terminates within the stomach. Minimal gas within small bowel and colon without abnormal distention on the supine image. No air-fluid levels on the supine image. Surgical changes of the anatomic pelvis. No unexpected radiopaque foreign body. Degenerative changes of the spine with postsurgical changes at L4-L5. IMPRESSION: Nonobstructive bowel gas pattern on this supine image. Gastric tube terminates within the stomach. Electronically Signed   By: Gilmer Mor D.O.   On: 04/29/2019 08:46   Dg Abd Portable 1v-small Bowel Protocol-position Verification  Result Date: 04/28/2019 CLINICAL DATA:  Nasogastric tube placement. Small bowel obstruction. EXAM: PORTABLE ABDOMEN - 1 VIEW COMPARISON:  CT abdomen and pelvis April 28, 2019 FINDINGS: Nasogastric tube tip and side port are in the stomach. There is no bowel dilatation or air-fluid level to suggest bowel obstruction. There is moderate air in the stomach. Lung bases are clear. No free air. There is contrast in the renal collecting systems bilaterally. IMPRESSION: Nasogastric tube tip and side port in stomach. Bowel dilatation no longer evident. No free air. Lung bases clear. Electronically Signed   By: Bretta Bang III M.D.   On: 04/28/2019 12:03    Anti-infectives: Anti-infectives (From admission, onward)   None      Assessment/Plan: Small bowel obstruction  Protocol films show contrast in the colon.  Clinically improved Will d/c NG tube Start clear liquids and advance diet as tolerated  LOS: 2 days    Abigail Miyamoto 04/30/2019

## 2019-04-30 NOTE — Progress Notes (Signed)
Advanced NGT and checked for placement, draining brown gastric contents.

## 2019-04-30 NOTE — Plan of Care (Signed)
  Problem: Health Behavior/Discharge Planning: Goal: Ability to manage health-related needs will improve Outcome: Progressing   

## 2019-05-01 DIAGNOSIS — Z720 Tobacco use: Secondary | ICD-10-CM

## 2019-05-01 DIAGNOSIS — G8929 Other chronic pain: Secondary | ICD-10-CM

## 2019-05-01 DIAGNOSIS — R6510 Systemic inflammatory response syndrome (SIRS) of non-infectious origin without acute organ dysfunction: Secondary | ICD-10-CM

## 2019-05-01 DIAGNOSIS — N179 Acute kidney failure, unspecified: Secondary | ICD-10-CM

## 2019-05-01 DIAGNOSIS — E876 Hypokalemia: Secondary | ICD-10-CM

## 2019-05-01 LAB — CBC
HCT: 33.4 % — ABNORMAL LOW (ref 36.0–46.0)
Hemoglobin: 11.1 g/dL — ABNORMAL LOW (ref 12.0–15.0)
MCH: 34.5 pg — ABNORMAL HIGH (ref 26.0–34.0)
MCHC: 33.2 g/dL (ref 30.0–36.0)
MCV: 103.7 fL — ABNORMAL HIGH (ref 80.0–100.0)
Platelets: 98 10*3/uL — ABNORMAL LOW (ref 150–400)
RBC: 3.22 MIL/uL — ABNORMAL LOW (ref 3.87–5.11)
RDW: 13.5 % (ref 11.5–15.5)
WBC: 6.8 10*3/uL (ref 4.0–10.5)
nRBC: 0 % (ref 0.0–0.2)

## 2019-05-01 LAB — BASIC METABOLIC PANEL
Anion gap: 7 (ref 5–15)
BUN: 12 mg/dL (ref 8–23)
CO2: 26 mmol/L (ref 22–32)
Calcium: 8.5 mg/dL — ABNORMAL LOW (ref 8.9–10.3)
Chloride: 108 mmol/L (ref 98–111)
Creatinine, Ser: 0.7 mg/dL (ref 0.44–1.00)
GFR calc Af Amer: >60 mL/min (ref >60)
GFR calc non Af Amer: >60 mL/min (ref >60)
Glucose, Bld: 97 mg/dL (ref 70–99)
Potassium: 2.9 mmol/L — ABNORMAL LOW (ref 3.5–5.1)
Sodium: 141 mmol/L (ref 135–145)

## 2019-05-01 MED ORDER — SIMETHICONE 80 MG PO CHEW
80.0000 mg | CHEWABLE_TABLET | Freq: Three times a day (TID) | ORAL | Status: DC
  Administered 2019-05-01 – 2019-05-02 (×3): 80 mg via ORAL
  Filled 2019-05-01 (×3): qty 1

## 2019-05-01 MED ORDER — POTASSIUM CHLORIDE 20 MEQ PO PACK
40.0000 meq | PACK | Freq: Four times a day (QID) | ORAL | Status: AC
  Administered 2019-05-01 (×2): 40 meq via ORAL
  Filled 2019-05-01 (×2): qty 2

## 2019-05-01 NOTE — Progress Notes (Signed)
PROGRESS NOTE    Patient: [name]                            PCP: Patient, No Pcp Per                    DOB: 05-19-1956            DOA: 04/28/2019 [id number]             DOS: 05/01/2019, 11:02 AM   LOS: 3 days   Date of Service: The patient was seen and examined on 05/01/2019  Subjective:   The patient was seen and examined this morning. She reports that she stopped eating/drinking again last night due to worsening nausea and abdominal discomfort. She reports small BM and reports passing some flatus yesterday, but none noted yet this morning.    Brief Narrative:   [name] is a 63 y.o. female with medical history significant of COPD, anxiety, chronic pain, and tobacco abuse.  She reports having previous surgeries include vaginal hysterectomy, ileal loop neobladder for interstitial cystitis, and fistula repairs.  She presents with complaints of 4 days of abdominal pain and constipation.    Complaining of persistent pain, nausea, vomiting, small bowel movement 2 days ago.  Emesis nonbloody nonbilious History of multiple previous abdominal surgeries. Continues to smoke cigarettes 1 pack/day  On admission denied having any significant fevers, chest pain, dysuria, or daily alcohol use.  ED Course:  On arrival febrile, tachycardic, tachypneic WBC 15.1, hemoglobin 15.1, creatinine 1.01   CT scan of the abdomen revealed a high-grade partial or complete small bowel obstruction with stricture involving loop of the distal/terminal ileum in the right lower quadrant.   General surgery was formally consulted.  Nasogastric tube was placed to suction.  COVID-19 screening was negative.  She was given 1 L normal saline IV fluids, Zofran, and Dilaudid for pain.  TRH called to admit.   04/29/2019 -NG tube in place, hypoactive bowel sounds, no bowel movements or gas General surgery following -recommending conservative management Continue to be n.p.o., on IV fluids  04/30/2019  -abdominal series revealed contrast in colon, general surgery has DC'd NG tube, initiating encouraging clear liquid diet today.  05/01/2019 - Overnight had worsening nausea and decreased oral intake.   Assessment & Plan:   Principal Problem:   SBO (small bowel obstruction) (HCC) Active Problems:   COPD (chronic obstructive pulmonary disease) (HCC)   SIRS (systemic inflammatory response syndrome) (HCC)   Tobacco abuse   Chronic pain   AKI (acute kidney injury) ([place])    Small bowel obstruction: -Improved abdominal pain, nausea vomiting -Awaiting bowel function, no reported gas or substantial BM yet  -CT abdomen pelvis reviewed.  High-grade partial or complete small bowel obstruction with stricture. - NGT to suction >>> DC'd 04/30/2019 -Antiemetics as needed -IV Dilaudid as needed for pain -Check abdominal series >> revealing contrast in colon  -General surgery following-conservative management, DC NG tube, recommending initiating clear liquid diet today  -Advancing to clear liquid diet today,   SIRS: -Likely reactive to above, much improved, stable -On admission met SIRS criteria, low-grade fever, tachycardic, tachypneic, with WBC of 16.0  >> 7.2>> 6.8 -This morning stable temp 98.3, pulse 84, respiratory rate 18 blood pressure 103/54, satting 94% on room air -No antibiotics initiated, -Suspecting acute reaction to above as she continues to have no clear source of infection  Suspect acute kidney injury: Patient  presents with creatinine 1.01>>>0.68 >> 0.7 -Baseline previously noted to be around 0.5. -We will continue it normal saline IV fluids at 50 mL/h until tolerating diet -Monitor renal function closely  Hypokalemia: - 2.9 today likely related to poor oral intake - Will replace with 40 meq x 2 doses today Q6H apart  COPD, without acute exacerbation -As needed O2 supplements, -Continue home inhalers Continue pharmacy substitution of Dulera for Symbicort and Spiriva   Chronic pain:  Patient reports having a long history of back pain from previous surgeries.  She is on oral hydromorphone at home. -Substitution for IV Dilaudid while n.p.o.  Tobacco abuse: Patient still reports smoking tobacco on a regular basis. -Patient was advised strongly on smoking cessation, NicoDerm patch has been provided   DVT prophylaxis: Lovenox  Code Status: Full Family Communication: Discussed with patient Disposition Plan: Likely discharge home once bowel function returns, likely in a.m. 05/02/2019  Consults called: Surgery Admission status: Inpatient     Procedures:   No admission procedures for hospital encounter.     Antimicrobials:  Anti-infectives (From admission, onward)   None       Medication:  . docusate  100 mg Oral BID  . enoxaparin (LOVENOX) injection  40 mg Subcutaneous Q24H  . mometasone-formoterol  2 puff Inhalation BID  . nicotine  21 mg Transdermal Daily  . pantoprazole (PROTONIX) IV  40 mg Intravenous Q12H  . potassium chloride  40 mEq Oral Q6H  . simethicone  80 mg Oral TID  . sodium chloride flush  3 mL Intravenous Once  . sodium chloride flush  3 mL Intravenous Q12H  . umeclidinium bromide  1 puff Inhalation Daily    acetaminophen **OR** acetaminophen, albuterol, HYDROmorphone (DILAUDID) injection, LORazepam, ondansetron (ZOFRAN) IV, phenol, promethazine   Objective:   Vitals:   04/30/19 2034 04/30/19 2043 05/01/19 0521 05/01/19 0854  BP:  113/67 (!) 103/54   Pulse:  63 (!) 52   Resp:  18 20   Temp:  98.1 F (36.7 C) 97.7 F (36.5 C)   TempSrc:  Oral Oral   SpO2: 97% 99% 98% 92%  Weight:      Height:        Intake/Output Summary (Last 24 hours) at 05/01/2019 1102 Last data filed at 05/01/2019 0300 Gross per 24 hour  Intake 2220.2 ml  Output 0 ml  Net 2220.2 ml   Filed Weights   04/30/19 1233  Weight: 50.8 kg     Examination:  BP (!) 103/54 (BP Location: Left Arm)   Pulse (!) 52   Temp 97.7 F (36.5  C) (Oral)   Resp 20   Ht '5\' 3"'  (1.6 m)   Wt 50.8 kg   SpO2 92%   BMI 19.84 kg/m    Physical Exam  Constitution:  Alert, cooperative, no distress,  Psychiatric: Normal and stable mood and affect, cognition intact,   HEENT: Normocephalic, PERRL, otherwise with in Normal limits  Chest:Chest symmetric Cardio vascular:  S1/S2, RRR, No murmure, No Rubs or Gallops  pulmonary: Clear to auscultation bilaterally, respirations unlabored, negative wheezes / crackles Abdomen: Soft, mild diffuse tenderness,, non-distended, hypoactive bowel sounds, no masses, no organomegaly Muscular skeletal: Limited exam - in bed, able to move all 4 extremities, Normal strength,  Neuro: CNII-XII intact. , normal motor and sensation, reflexes intact  Extremities: No pitting edema lower extremities, +2 pulses  Skin: Dry, warm to touch, negative for any Rashes, No open wounds Wounds: per nursing documentation     LABs:  CBC Latest Ref Rng & Units 05/01/2019 04/30/2019 04/29/2019  WBC 4.0 - 10.5 K/uL 6.8 9.4 7.2  Hemoglobin 12.0 - 15.0 g/dL 11.1(L) 12.3 11.4(L)  Hematocrit 36.0 - 46.0 % 33.4(L) 38.1 33.6(L)  Platelets 150 - 400 K/uL 98(L) 120(L) 104(L)   CMP Latest Ref Rng & Units 05/01/2019 04/30/2019 04/29/2019  Glucose 70 - 99 mg/dL 97 83 76  BUN 8 - 23 mg/dL '12 18 13  ' Creatinine 0.44 - 1.00 mg/dL 0.70 0.99 0.68  Sodium 135 - 145 mmol/L 141 148(H) 143  Potassium 3.5 - 5.1 mmol/L 2.9(L) 4.1 4.0  Chloride 98 - 111 mmol/L 108 110 107  CO2 22 - 32 mmol/L '26 24 26  ' Calcium 8.9 - 10.3 mg/dL 8.5(L) 9.2 8.8(L)  Total Protein 6.5 - 8.1 g/dL - - -  Total Bilirubin 0.3 - 1.2 mg/dL - - -  Alkaline Phos 38 - 126 U/L - - -  AST 15 - 41 U/L - - -  ALT 0 - 44 U/L - - -     SIGNED: Arlan Organ, DO [facility]

## 2019-05-01 NOTE — Progress Notes (Addendum)
Subjective: CC: Patient reports that she spit up a tiny amount after lunch yesterday. Did have some waves of nausea yesterday as well as some burping and belching. She tolerated jello at dinner and today at breakfast. She is not a fan of the [facility] trays and is asking for grits. She is passing flatus and had a small watery bm yesterday evening. She is not getting up to mobilize much.   Objective: Vital signs in last 24 hours: Temp:  [97.7 F (36.5 C)-98.2 F (36.8 C)] 97.7 F (36.5 C) (11/21 0521) Pulse Rate:  [52-72] 52 (11/21 0521) Resp:  [18-20] 20 (11/21 0521) BP: (103-120)/(54-67) 103/54 (11/21 0521) SpO2:  [92 %-99 %] 92 % (11/21 0854) Weight:  [50.8 kg] 50.8 kg (11/20 1233) Last BM Date: 04/30/19  Intake/Output from previous day: 11/20 0701 - 11/21 0700 In: 3996.5 [P.O.:1760; I.V.:2236.5] Out: 0  Intake/Output this shift: No intake/output data recorded.  PE: Gen:  Alert, NAD, pleasant Lungs: On o2. Normal rate and effort  Abd: Soft, mild distension, mild tenderness of the epigastrium without r/r/g. +BS Ext:  No LE edema Psych: A&Ox3  Skin: no rashes noted, warm and dry   Lab Results:  Recent Labs    04/30/19 0216 05/01/19 0255  WBC 9.4 6.8  HGB 12.3 11.1*  HCT 38.1 33.4*  PLT 120* 98*   BMET Recent Labs    04/30/19 0216 05/01/19 0255  NA 148* 141  K 4.1 2.9*  CL 110 108  CO2 24 26  GLUCOSE 83 97  BUN 18 12  CREATININE 0.99 0.70  CALCIUM 9.2 8.5*   PT/INR No results for input(s): LABPROT, INR in the last 72 hours. CMP     Component Value Date/Time   NA 141 05/01/2019 0255   K 2.9 (L) 05/01/2019 0255   CL 108 05/01/2019 0255   CO2 26 05/01/2019 0255   GLUCOSE 97 05/01/2019 0255   BUN 12 05/01/2019 0255   CREATININE 0.70 05/01/2019 0255   CALCIUM 8.5 (L) 05/01/2019 0255   PROT 7.7 04/28/2019 0253   ALBUMIN 4.8 04/28/2019 0253   AST 19 04/28/2019 0253   ALT 18 04/28/2019 0253   ALKPHOS 166 (H) 04/28/2019 0253   BILITOT 0.7  04/28/2019 0253   GFRNONAA >60 05/01/2019 0255   GFRAA >60 05/01/2019 0255   Lipase     Component Value Date/Time   LIPASE 20 04/28/2019 0253       Studies/Results: Dg Abd Portable 1v-small Bowel Obstruction Protocol-initial, 8 Hr Delay  Result Date: 04/29/2019 CLINICAL DATA:  Small-bowel obstruction 8 hour delay EXAM: PORTABLE ABDOMEN - 1 VIEW COMPARISON:  04/29/2019, 04/28/2019 FINDINGS: Esophageal tube has been withdrawn, the tip is now at the distal esophagus. Radiopaque contrast within the colon. No dilated small bowel is seen. Postsurgical changes in the lower lumbar spine IMPRESSION: 1. Nonobstructed gas pattern with contrast material in the colon 2. Esophageal tube tip at the distal esophagus, further advancement recommended for more optimal position These results will be called to the ordering clinician or representative by the Radiologist Assistant, and communication documented in the PACS or zVision Dashboard. Electronically Signed   By: Jasmine Pang M.D.   On: 04/29/2019 19:18    Anti-infectives: Anti-infectives (From admission, onward)   None       Assessment/Plan SBO  - S/p SBO protocol with contrast in colon. NGT out - Adv to FLD for options - Maximize nausea medications. Phenergan for refractory nausea - Mobilize for bowel  function  - Minimize narcotics if able to aid in bowel motility  - Keep K> 4 and Mg > 2 for bowel function   FEN - FLD, replace k VTE - SCDs, Lovenox ID - None   LOS: 3 days    Jillyn Ledger , [facility] Surgery 05/01/2019, 10:24 AM Please see Amion for pager number during day hours 7:00am-4:30pm

## 2019-05-02 DIAGNOSIS — J449 Chronic obstructive pulmonary disease, unspecified: Secondary | ICD-10-CM

## 2019-05-02 LAB — CBC
HCT: 31.5 % — ABNORMAL LOW (ref 36.0–46.0)
Hemoglobin: 10.7 g/dL — ABNORMAL LOW (ref 12.0–15.0)
MCH: 35.1 pg — ABNORMAL HIGH (ref 26.0–34.0)
MCHC: 34 g/dL (ref 30.0–36.0)
MCV: 103.3 fL — ABNORMAL HIGH (ref 80.0–100.0)
Platelets: 88 10*3/uL — ABNORMAL LOW (ref 150–400)
RBC: 3.05 MIL/uL — ABNORMAL LOW (ref 3.87–5.11)
RDW: 13.3 % (ref 11.5–15.5)
WBC: 5.9 10*3/uL (ref 4.0–10.5)
nRBC: 0 % (ref 0.0–0.2)

## 2019-05-02 LAB — MAGNESIUM: Magnesium: 1.7 mg/dL (ref 1.7–2.4)

## 2019-05-02 LAB — BASIC METABOLIC PANEL
Anion gap: 6 (ref 5–15)
BUN: 8 mg/dL (ref 8–23)
CO2: 26 mmol/L (ref 22–32)
Calcium: 8.5 mg/dL — ABNORMAL LOW (ref 8.9–10.3)
Chloride: 109 mmol/L (ref 98–111)
Creatinine, Ser: 0.68 mg/dL (ref 0.44–1.00)
GFR calc Af Amer: >60 mL/min (ref >60)
GFR calc non Af Amer: >60 mL/min (ref >60)
Glucose, Bld: 88 mg/dL (ref 70–99)
Potassium: 3.2 mmol/L — ABNORMAL LOW (ref 3.5–5.1)
Sodium: 141 mmol/L (ref 135–145)

## 2019-05-02 MED ORDER — DOCUSATE SODIUM 100 MG PO CAPS
100.0000 mg | ORAL_CAPSULE | Freq: Two times a day (BID) | ORAL | Status: DC
  Administered 2019-05-02: 100 mg via ORAL
  Filled 2019-05-02: qty 1

## 2019-05-02 MED ORDER — MAGNESIUM SULFATE 2 GM/50ML IV SOLN
2.0000 g | Freq: Once | INTRAVENOUS | Status: AC
  Administered 2019-05-02: 2 g via INTRAVENOUS
  Filled 2019-05-02: qty 50

## 2019-05-02 MED ORDER — PANTOPRAZOLE SODIUM 40 MG PO TBEC: 40.0000 mg | DELAYED_RELEASE_TABLET | Freq: Every day | ORAL | 1 refills | Status: DC

## 2019-05-02 MED ORDER — POTASSIUM CHLORIDE CRYS ER 20 MEQ PO TBCR
40.0000 meq | EXTENDED_RELEASE_TABLET | Freq: Four times a day (QID) | ORAL | Status: AC
  Administered 2019-05-02 (×2): 40 meq via ORAL
  Filled 2019-05-02 (×2): qty 2

## 2019-05-02 MED ORDER — POLYETHYLENE GLYCOL 3350 17 G PO PACK
17.0000 g | PACK | Freq: Every day | ORAL | Status: DC
  Administered 2019-05-02: 17 g via ORAL
  Filled 2019-05-02: qty 1

## 2019-05-02 MED ORDER — DOCUSATE SODIUM 100 MG PO CAPS: 100.0000 mg | ORAL_CAPSULE | Freq: Two times a day (BID) | ORAL | 0 refills | Status: DC

## 2019-05-02 MED ORDER — INFLUENZA VAC SPLIT QUAD 0.5 ML IM SUSY
0.5000 mL | PREFILLED_SYRINGE | INTRAMUSCULAR | Status: AC
  Administered 2019-05-02: 0.5 mL via INTRAMUSCULAR
  Filled 2019-05-02: qty 0.5

## 2019-05-02 MED ORDER — ONDANSETRON HCL 4 MG PO TABS: 4.0000 mg | ORAL_TABLET | Freq: Three times a day (TID) | ORAL | 1 refills | Status: AC | PRN

## 2019-05-02 MED ORDER — SIMETHICONE 80 MG PO CHEW: 80.0000 mg | CHEWABLE_TABLET | Freq: Three times a day (TID) | ORAL | 0 refills | Status: DC

## 2019-05-02 MED ORDER — PNEUMOCOCCAL VAC POLYVALENT 25 MCG/0.5ML IJ INJ
0.5000 mL | INJECTION | INTRAMUSCULAR | Status: AC
  Administered 2019-05-02: 0.5 mL via INTRAMUSCULAR
  Filled 2019-05-02: qty 0.5

## 2019-05-02 MED ORDER — PANTOPRAZOLE SODIUM 40 MG PO TBEC: 40.0000 mg | DELAYED_RELEASE_TABLET | Freq: Two times a day (BID) | ORAL | Status: DC

## 2019-05-02 MED ORDER — POLYETHYLENE GLYCOL 3350 17 G PO PACK: 17.0000 g | PACK | Freq: Every day | ORAL | 0 refills | Status: DC

## 2019-05-02 NOTE — Discharge Summary (Signed)
Physician Discharge Summary  [name] ZOX:[id number] DOB: [birth year] DOA: 04/28/2019  PCP: Patient, No Pcp Per  Admit date: 04/28/2019 Discharge date: 05/02/2019  Admitted From: Home Disposition: Home  Recommendations for Outpatient Follow-up:  1. Follow up with PCP in 1-2 weeks 2. Please obtain BMP/CBC in one week 3. Please follow up on the following pending results:  Home Health: No Equipment/Devices: None Discharge Condition: Stable CODE STATUS: Full Diet recommendation: Soft diet and advance as tolerated Brief/Interim Summary:  Penny DevonBeverly S Mckenzie a 63 y.o.femalewith medical history significant ofCOPD,anxiety,chronic pain, and tobacco abuse. She reports having previous surgeries include vaginal hysterectomy, ileal loop neobladder for interstitial cystitis, and fistula repairs. She presents with complaints of 4 days of abdominal pain and constipation.   Complaining of persistent pain, nausea, vomiting, small bowel movement 2 days ago.  Emesis nonbloody nonbilious History of multiple previous abdominal surgeries. Continues to smoke cigarettes 1 pack/day  ED Course:  CT scan of the abdomen revealed a high-grade partial or complete small bowel obstruction with stricture involving loop of the distal/terminal ileum in the right lower quadrant.  General surgery was formally consulted. Nasogastric tube was placed to suction. COVID-19 screening was negative. She was given 1 L normal saline IV fluids, Zofran, and Dilaudid for pain. [facility] called to admit.  04/29/2019 -NG tube in place, hypoactive bowel sounds, no bowel movements or gas General surgery following -recommending conservative management Continue to be n.p.o., on IV fluids  04/30/2019 -abdominal series revealed contrast in colon, general surgery has DC'd NG tube, initiating encouraging clear liquid diet today.  05/01/2019 - Overnight had worsening nausea and decreased oral intake.   05/02/2019 -patient  with improved appetite and able to take soft diet without difficulty.  She had a small bowel movement the night before and is able to tolerate her lunch without difficulty as well.  No further episodes of nausea or abdominal pain noted.    Her small bowel obstruction resolved with conservative management however this is also complicated by chronic constipation.  Due to this have added Colace as well as MiraLAX to her outpatient regimen.  Surgery has been following and feel patient is stable for discharge as well given that she is now tolerating her diet without difficulty.  Her husband is at bedside this afternoon and all questions and concerns were addressed prior to discharge.  Discharge Diagnoses:  Principal Problem:   SBO (small bowel obstruction) (HCC) Active Problems:   COPD (chronic obstructive pulmonary disease) (HCC)   SIRS (systemic inflammatory response syndrome) (HCC)   Tobacco abuse   Chronic pain   AKI (acute kidney injury) (HCC)   Hypokalemia  Small bowel obstruction: -CT abdomen pelvis reviewed. High-grade partial or complete small bowel obstruction with stricture.  -Antiemetics as needed.  Discharged home with Zofran as needed -Abdominal series >> revealing contrast in colon  -General surgery following-conservative management with eventual resolution of her small bowel obstruction.  SIRS: -Resolved  Suspect acute kidney injury: -Resolved  Hypokalemia: -3.2. -Likely related to poor oral intake.  Provided 40 mEq x 2 today.  Anticipate this improving with improved oral intake.   COPD, without acute exacerbation -As needed O2 supplements, -Continue home inhalers  Tobacco abuse: Patient still reports smoking tobacco on a regular basis. -Patient was advised strongly on smoking cessation, NicoDerm patch has been provided  Discharge Instructions   Allergies as of 05/02/2019   No Known Allergies     Medication List    TAKE these medications   albuterol  108 (90 Base) MCG/ACT inhaler Commonly known as: VENTOLIN HFA Inhale 2 puffs into the lungs every 6 (six) hours as needed for wheezing or shortness of breath.   ALPRAZolam 1 MG tablet Commonly known as: XANAX Take 0.5-1 mg by mouth at bedtime as needed for sleep or anxiety.   docusate sodium 100 MG capsule Commonly known as: COLACE Take 1 capsule (100 mg total) by mouth 2 (two) times daily.   ondansetron 4 MG tablet Commonly known as: Zofran Take 1 tablet (4 mg total) by mouth every 8 (eight) hours as needed for nausea or vomiting.   pantoprazole 40 MG tablet Commonly known as: Protonix Take 1 tablet (40 mg total) by mouth daily.   polyethylene glycol 17 g packet Commonly known as: MIRALAX / GLYCOLAX Take 17 g by mouth daily.   simethicone 80 MG chewable tablet Commonly known as: MYLICON Chew 1 tablet (80 mg total) by mouth 3 (three) times daily.      Follow-up Information    [facility]. Schedule an appointment as soon as possible for a visit in 1 week(s).          No Known Allergies  Consultations:  Surgery, [facility]   Procedures/Studies: Ct Abdomen Pelvis W Contrast  Result Date: 04/28/2019 CLINICAL DATA:  Abdominal pain, nausea, constipation EXAM: CT ABDOMEN AND PELVIS WITH CONTRAST TECHNIQUE: Multidetector CT imaging of the abdomen and pelvis was performed using the standard protocol following bolus administration of intravenous contrast. CONTRAST:  OMNIPAQUE IOHEXOL 300 MG/ML  SOLN COMPARISON:  None. FINDINGS: Lower chest: Lung bases are clear. Hepatobiliary: Liver is within normal limits. Gallbladder is unremarkable. No intrahepatic or extrahepatic ductal dilatation. Pancreas: Within normal limits. Spleen: Within normal limits. Adrenals/Urinary Tract: Adrenal glands are within normal limits. Kidneys are within normal limits.  No hydronephrosis. Cystectomy with orthotopic neobladder. Stomach/Bowel: Distended stomach. Multiple  dilated loops of small bowel throughout the abdomen. Small bowel suture line in the right lower quadrant (series 3/image 59). Loop of small bowel distal to the suture is decompressed to the terminal ileum/ileocecal valve (series 3/images 46, 58, and 64), suggesting stricture. Left colon is decompressed. Appendix is not discretely visualized. Vascular/Lymphatic: No evidence of abdominal aortic aneurysm. Atherosclerotic calcifications of the abdominal aorta and branch vessels. No suspicious abdominopelvic lymphadenopathy. Reproductive: Status post hysterectomy. No adnexal masses. Other: No abdominopelvic ascites. Musculoskeletal: Degenerative changes the lower lumbar spine. Ray cage fusion at L4-5. IMPRESSION: High-grade partial or complete small bowel obstruction with stricture involving a loop of distal/terminal ileum in the right lower quadrant. Postsurgical changes related to cystectomy with orthotopic neobladder. Electronically Signed   By: Charline Bills M.D.   On: 04/28/2019 09:31   Dg Abd Portable 1v-small Bowel Obstruction Protocol-initial, 8 Hr Delay  Result Date: 04/29/2019 CLINICAL DATA:  Small-bowel obstruction 8 hour delay EXAM: PORTABLE ABDOMEN - 1 VIEW COMPARISON:  04/29/2019, 04/28/2019 FINDINGS: Esophageal tube has been withdrawn, the tip is now at the distal esophagus. Radiopaque contrast within the colon. No dilated small bowel is seen. Postsurgical changes in the lower lumbar spine IMPRESSION: 1. Nonobstructed gas pattern with contrast material in the colon 2. Esophageal tube tip at the distal esophagus, further advancement recommended for more optimal position These results will be called to the ordering clinician or representative by the Radiologist Assistant, and communication documented in the PACS or zVision Dashboard. Electronically Signed   By: Jasmine Pang M.D.   On: 04/29/2019 19:18   Dg Abd Portable 1v  Result Date: 04/29/2019 CLINICAL  DATA:  63 year old female with small  bowel obstruction EXAM: PORTABLE ABDOMEN - 1 VIEW COMPARISON:  04/28/2019 FINDINGS: Gastric tube terminates within the stomach. Minimal gas within small bowel and colon without abnormal distention on the supine image. No air-fluid levels on the supine image. Surgical changes of the anatomic pelvis. No unexpected radiopaque foreign body. Degenerative changes of the spine with postsurgical changes at L4-L5. IMPRESSION: Nonobstructive bowel gas pattern on this supine image. Gastric tube terminates within the stomach. Electronically Signed   By: Corrie Mckusick D.O.   On: 04/29/2019 08:46   Dg Abd Portable 1v-small Bowel Protocol-position Verification  Result Date: 04/28/2019 CLINICAL DATA:  Nasogastric tube placement. Small bowel obstruction. EXAM: PORTABLE ABDOMEN - 1 VIEW COMPARISON:  CT abdomen and pelvis April 28, 2019 FINDINGS: Nasogastric tube tip and side port are in the stomach. There is no bowel dilatation or air-fluid level to suggest bowel obstruction. There is moderate air in the stomach. Lung bases are clear. No free air. There is contrast in the renal collecting systems bilaterally. IMPRESSION: Nasogastric tube tip and side port in stomach. Bowel dilatation no longer evident. No free air. Lung bases clear. Electronically Signed   By: Lowella Grip III M.D.   On: 04/28/2019 12:03       Subjective: Patient is lying in bed no acute distress this morning.  Upon reevaluation this afternoon she reports that she tolerated her breakfast without difficulty and currently denies any significant nausea.  She does report some mild bloating after breakfast however states that this seems to have resolved as well.  She denies any abdominal pain or discomfort.  She denies any chest pain, shortness of breath, nausea, vomiting, fever, or chills.  Discharge Exam: Vitals:   05/01/19 2102 05/02/19 0520  BP: 121/70 (!) 102/53  Pulse: 71 (!) 59  Resp: 19 20  Temp: 98.3 F (36.8 C) 97.7 F (36.5 C)   SpO2: 100% 99%   Vitals:   05/01/19 1343 05/01/19 1945 05/01/19 2102 05/02/19 0520  BP: 115/71  121/70 (!) 102/53  Pulse: 68  71 (!) 59  Resp: (!) 22  19 20   Temp: 98 F (36.7 C)  98.3 F (36.8 C) 97.7 F (36.5 C)  TempSrc: Oral  Oral Oral  SpO2: 92% 96% 100% 99%  Weight:      Height:        General: Pt is alert, awake, not in acute distress Cardiovascular: RRR, S1/S2 +, no rubs, no gallops Respiratory: CTA bilaterally, no wheezing, no rhonchi Abdominal: Soft, NT, ND, bowel sounds + Extremities: no edema, no cyanosis    The results of significant diagnostics from this hospitalization (including imaging, microbiology, ancillary and laboratory) are listed below for reference.     Microbiology: Recent Results (from the past 240 hour(s))  SARS CORONAVIRUS 2 (TAT 6-24 HRS) Nasopharyngeal Nasopharyngeal Swab     Status: None   Collection Time: 04/28/19 10:35 AM   Specimen: Nasopharyngeal Swab  Result Value Ref Range Status   SARS Coronavirus 2 NEGATIVE NEGATIVE Final    Comment: (NOTE) SARS-CoV-2 target nucleic acids are NOT DETECTED. The SARS-CoV-2 RNA is generally detectable in upper and lower respiratory specimens during the acute phase of infection. Negative results do not preclude SARS-CoV-2 infection, do not rule out co-infections with other pathogens, and should not be used as the sole basis for treatment or other patient management decisions. Negative results must be combined with clinical observations, patient history, and epidemiological information. The expected result is Negative. Fact Sheet  for Patients: HairSlick.no Fact Sheet for Healthcare Providers: quierodirigir.com This test is not yet approved or cleared by the [place] FDA and  has been authorized for detection and/or diagnosis of SARS-CoV-2 by FDA under an Emergency Use Authorization (EUA). This EUA will remain  in effect (meaning this test  can be used) for the duration of the COVID-19 declaration under Section 56 4(b)(1) of the Act, 21 U.S.C. section 360bbb-3(b)(1), unless the authorization is terminated or revoked sooner. Performed at [facility], [address], [place], [place] [address]      Labs: BNP (last 3 results) No results for input(s): BNP in the last 8760 hours. Basic Metabolic Panel: Recent Labs  Lab 04/28/19 0253 04/28/19 0833 04/29/19 0308 04/30/19 0216 05/01/19 0255 05/02/19 0311  NA 140  --  143 148* 141 141  K 3.9  --  4.0 4.1 2.9* 3.2*  CL 98  --  107 110 108 109  CO2 29  --  GLUCOSE 114*  --  76 83 97 88  BUN 13  --  CREATININE 1.01*  --  0.68 0.99 0.70 0.68  CALCIUM 10.3  --  8.8* 9.2 8.5* 8.5*  MG  --  2.3  --   --   --  1.7   Liver Function Tests: Recent Labs  Lab 04/28/19 0253  AST 19  ALT 18  ALKPHOS 166*  BILITOT 0.7  PROT 7.7  ALBUMIN 4.8   Recent Labs  Lab 04/28/19 0253  LIPASE 20   No results for input(s): AMMONIA in the last 168 hours. CBC: Recent Labs  Lab 04/28/19 0253 04/29/19 0308 04/30/19 0216 05/01/19 0255 05/02/19 0311  WBC 15.5*  16.0* 7.2 9.4 6.8 5.9  NEUTROABS 12.4*  --   --   --   --   HGB 15.1*  15.2* 11.4* 12.3 11.1* 10.7*  HCT 44.5  43.4 33.6* 38.1 33.4* 31.5*  MCV 101.8*  100.2* 101.5* 106.4* 103.7* 103.3*  PLT 171  174 104* 120* 98* 88*   Cardiac Enzymes: No results for input(s): CKTOTAL, CKMB, CKMBINDEX, TROPONINI in the last 168 hours. BNP: Invalid input(s): POCBNP CBG: No results for input(s): GLUCAP in the last 168 hours. D-Dimer No results for input(s): DDIMER in the last 72 hours. Hgb A1c No results for input(s): HGBA1C in the last 72 hours. Lipid Profile No results for input(s): CHOL, HDL, LDLCALC, TRIG, CHOLHDL, LDLDIRECT in the last 72 hours. Thyroid function studies No results for input(s): TSH, T4TOTAL, T3FREE, THYROIDAB in the last 72 hours.  Invalid input(s): FREET3 Anemia work  up No results for input(s): VITAMINB12, FOLATE, FERRITIN, TIBC, IRON, RETICCTPCT in the last 72 hours. Urinalysis    Component Value Date/Time   COLORURINE YELLOW 04/28/2019 0230   APPEARANCEUR HAZY (A) 04/28/2019 0230   LABSPEC 1.012 04/28/2019 0230   PHURINE 7.0 04/28/2019 0230   GLUCOSEU NEGATIVE 04/28/2019 0230   HGBUR MODERATE (A) 04/28/2019 0230   BILIRUBINUR NEGATIVE 04/28/2019 0230   KETONESUR NEGATIVE 04/28/2019 0230   PROTEINUR 30 (A) 04/28/2019 0230   UROBILINOGEN 0.2 05/11/2007 1915   NITRITE NEGATIVE 04/28/2019 0230   LEUKOCYTESUR NEGATIVE 04/28/2019 0230   Sepsis Labs Invalid input(s): PROCALCITONIN,  WBC,  LACTICIDVEN Microbiology Recent Results (from the past 240 hour(s))  SARS CORONAVIRUS 2 (TAT 6-24 HRS) Nasopharyngeal Nasopharyngeal Swab     Status: None   Collection Time: 04/28/19 10:35 AM   Specimen: Nasopharyngeal Swab  Result Value Ref  Range Status   SARS Coronavirus 2 NEGATIVE NEGATIVE Final    Comment: (NOTE) SARS-CoV-2 target nucleic acids are NOT DETECTED. The SARS-CoV-2 RNA is generally detectable in upper and lower respiratory specimens during the acute phase of infection. Negative results do not preclude SARS-CoV-2 infection, do not rule out co-infections with other pathogens, and should not be used as the sole basis for treatment or other patient management decisions. Negative results must be combined with clinical observations, patient history, and epidemiological information. The expected result is Negative. Fact Sheet for Patients: HairSlick.no Fact Sheet for Healthcare Providers: quierodirigir.com This test is not yet approved or cleared by the [place] FDA and  has been authorized for detection and/or diagnosis of SARS-CoV-2 by FDA under an Emergency Use Authorization (EUA). This EUA will remain  in effect (meaning this test can be used) for the duration of the COVID-19  declaration under Section 56 4(b)(1) of the Act, 21 U.S.C. section 360bbb-3(b)(1), unless the authorization is terminated or revoked sooner. Performed at [facility], [address], [place], [place] [address]      Time coordinating discharge: Over 30 minutes  SIGNED:   Pollyann Savoy, MD  [facility] 05/02/2019, 3:44 PM Pager   If 7PM-7AM, please contact night-coverage [email or web address] Password [email or web address]

## 2019-05-02 NOTE — Discharge Instructions (Signed)
Bowel Obstruction °A bowel obstruction means that something is blocking the small or large bowel. The bowel is also called the intestine. It is the long tube that connects the stomach to the opening of the butt (anus). When something blocks the bowel, food and fluids cannot pass through like normal. This condition needs to be treated. Treatment depends on the cause of the problem and how bad the problem is. °What are the causes? °Common causes of this condition include: °· Scar tissue (adhesions) from past surgery or from high-energy X-rays (radiation). °· Recent surgery in the belly. This affects how food moves in the bowel. °· Some diseases, such as: °? Irritation of the lining of the digestive tract (Crohn's disease). °? Irritation of small pouches in the bowel (diverticulitis). °· Growths or tumors. °· A bulging organ (hernia). °· Twisting of the bowel (volvulus). °· A foreign body. °· Slipping of a part of the bowel into another part (intussusception). °What are the signs or symptoms? °Symptoms of this condition include: °· Pain in the belly. °· Feeling sick to your stomach (nauseous). °· Throwing up (vomiting). °· Bloating in the belly. °· Being unable to pass gas. °· Trouble pooping (constipation). °· Watery poop (diarrhea). °· A lot of belching. °How is this diagnosed? °This condition may be diagnosed based on: °· A physical exam. °· Medical history. °· Imaging tests, such as X-ray or CT scan. °· Blood tests. °· Urine tests. °How is this treated? °Treatment for this condition may include: °· Fluids and pain medicines that are given through an IV tube. Your doctor may tell you not to eat or drink if you feel sick to your stomach and are throwing up. °· Eating a clear liquid diet for a few days. °· Putting a small tube (nasogastric tube) into the stomach. This will help with pain, discomfort, and nausea by removing blocked air and fluids from the stomach. °· Surgery. This may be needed if other treatments do  not work. °Follow these instructions at home: °Medicines °· Take over-the-counter and prescription medicines only as told by your doctor. °· If you were prescribed an antibiotic medicine, take it as told by your doctor. Do not stop taking the antibiotic even if you start to feel better. °General instructions °· Follow your diet as told by your doctor. You may need to: °? Only drink clear liquids until you start to get better. °? Avoid solid foods. °· Return to your normal activities as told by your doctor. Ask your doctor what activities are safe for you. °· Do not sit for a long time without moving. Get up to take short walks every 1-2 hours. This is important. Ask for help if you feel weak or unsteady. °· Keep all follow-up visits as told by your doctor. This is important. °How is this prevented? °After having a bowel obstruction, you may be more likely to have another. You can do some things to stop it from happening again. °· If you have a long-term (chronic) disease, contact your doctor if you see changes or problems. °· Take steps to prevent or treat trouble pooping. Your doctor may ask that you: °? Drink enough fluid to keep your pee (urine) pale yellow. °? Take over-the-counter or prescription medicines. °? Eat foods that are high in fiber. These include beans, whole grains, and fresh fruits and vegetables. °? Limit foods that are high in fat and sugar. These include fried or sweet foods. °· Stay active. Ask your doctor which exercises are   safe for you. °· Avoid stress. °· Eat three small meals and three small snacks each day. °· Work with a food expert (dietitian) to make a meal plan that works for you. °· Do not use any products that contain nicotine or tobacco, such as cigarettes and e-cigarettes. If you need help quitting, ask your doctor. °Contact a doctor if: °· You have a fever. °· You have chills. °Get help right away if: °· You have pain or cramps that get worse. °· You throw up blood. °· You are  sick to your stomach. °· You cannot stop throwing up. °· You cannot drink fluids. °· You feel mixed up (confused). °· You feel very thirsty (dehydrated). °· Your belly gets more bloated. °· You feel weak or you pass out (faint). °Summary °· A bowel obstruction means that something is blocking the small or large bowel. °· Treatment may include IV fluids and pain medicine. You may also have a clear liquid diet, a small tube in your stomach, or surgery. °· Drink clear liquids and avoid solid foods until you get better. °This information is not intended to replace advice given to you by your health care provider. Make sure you discuss any questions you have with your health care provider. °Document Released: 07/04/2004 Document Revised: 10/08/2017 Document Reviewed: 10/08/2017 °Elsevier Patient Education © 2020 [employer] Inc. ° °

## 2019-05-02 NOTE — Progress Notes (Signed)
Pt discharged to home accompanied by husband.  DC instructions and # for follow up given and reviewed.  Flu and PNA shots given prior to DC and paperwork given to pt about them.

## 2019-05-02 NOTE — Progress Notes (Addendum)
       Subjective: CC: Patient reports that she had some small nausea after a late breakfast.  She reports that as the day went on her nausea resolved and she had more of an appetite.  She is hungry this morning requesting to have more real food.  She finished most of her full liquid diet tray at dinner yesterday she states.  She continues to pass flatus.  She did have a very small BM yesterday before bed that she states was hard, formed with small pebble-like stool. Pain has improved and is mild in her epigastrium currently. More pressure than pain. I encouraged her to mobilize.  Objective: Vital signs in last 24 hours: Temp:  [97.7 F (36.5 C)-98.3 F (36.8 C)] 97.7 F (36.5 C) (11/22 0520) Pulse Rate:  [59-71] 59 (11/22 0520) Resp:  [19-22] 20 (11/22 0520) BP: (102-121)/(53-71) 102/53 (11/22 0520) SpO2:  [92 %-100 %] 99 % (11/22 0520) Last BM Date: 04/29/19  Intake/Output from previous day: 11/21 0701 - 11/22 0700 In: 120 [P.O.:120] Out: -  Intake/Output this shift: No intake/output data recorded.  PE: Gen:  Alert, NAD, pleasant Lungs: On o2. Normal rate and effort  Abd: Soft, mild distension, mild tenderness of the epigastrium that is less than yesterday and without r/r/g. +BS Ext:  No LE edema Psych: A&Ox3  Skin: no rashes noted, warm and dry  Lab Results:  Recent Labs    05/01/19 0255 05/02/19 0311  WBC 6.8 5.9  HGB 11.1* 10.7*  HCT 33.4* 31.5*  PLT 98* 88*   BMET Recent Labs    05/01/19 0255 05/02/19 0311  NA 141 141  K 2.9* 3.2*  CL 108 109  CO2 26 26  GLUCOSE 97 88  BUN 12 8  CREATININE 0.70 0.68  CALCIUM 8.5* 8.5*   PT/INR No results for input(s): LABPROT, INR in the last 72 hours. CMP     Component Value Date/Time   NA 141 05/02/2019 0311   K 3.2 (L) 05/02/2019 0311   CL 109 05/02/2019 0311   CO2 26 05/02/2019 0311   GLUCOSE 88 05/02/2019 0311   BUN 8 05/02/2019 0311   CREATININE 0.68 05/02/2019 0311   CALCIUM 8.5 (L) 05/02/2019 0311   PROT 7.7 04/28/2019 0253   ALBUMIN 4.8 04/28/2019 0253   AST 19 04/28/2019 0253   ALT 18 04/28/2019 0253   ALKPHOS 166 (H) 04/28/2019 0253   BILITOT 0.7 04/28/2019 0253   GFRNONAA >60 05/02/2019 0311   GFRAA >60 05/02/2019 0311   Lipase     Component Value Date/Time   LIPASE 20 04/28/2019 0253       Studies/Results: No results found.  Anti-infectives: Anti-infectives (From admission, onward)   None       Assessment/Plan SBO  - Adv to soft diet - Mobilize for bowel function  - Minimize narcotics if able to aid in bowel motility  - Keep K> 4 and Mg > 2 for bowel function   FEN - Soft, replace k, bowel regiment VTE - SCDs, Lovenox ID - None  Plan: Discussed with hospitalist.  Patient is having bowel function and tolerating full liquid diet.  Will advance to soft diet.  If tolerates can be discharged from our standpoint.   LOS: 4 days    Jillyn Ledger , [facility] 05/02/2019, 9:16 AM Please see Amion for pager number during day hours 7:00am-4:30pm

## 2019-05-13 ENCOUNTER — Ambulatory Visit: Payer: Medicare Other

## 2019-05-14 ENCOUNTER — Emergency Department (HOSPITAL_COMMUNITY)
Admission: EM | Admit: 2019-05-14 | Discharge: 2019-05-15 | Disposition: A | Discharge status: Home / Self Care | Payer: Medicare Other | Attending: Emergency Medicine | Admitting: Emergency Medicine

## 2019-05-14 ENCOUNTER — Encounter (HOSPITAL_COMMUNITY): Payer: Self-pay

## 2019-05-14 ENCOUNTER — Other Ambulatory Visit: Payer: Self-pay

## 2019-05-14 ENCOUNTER — Emergency Department (HOSPITAL_COMMUNITY): Payer: Medicare Other

## 2019-05-14 DIAGNOSIS — E039 Hypothyroidism, unspecified: Secondary | ICD-10-CM | POA: Insufficient documentation

## 2019-05-14 DIAGNOSIS — F17210 Nicotine dependence, cigarettes, uncomplicated: Secondary | ICD-10-CM | POA: Diagnosis not present

## 2019-05-14 DIAGNOSIS — J449 Chronic obstructive pulmonary disease, unspecified: Secondary | ICD-10-CM | POA: Insufficient documentation

## 2019-05-14 DIAGNOSIS — K567 Ileus, unspecified: Secondary | ICD-10-CM | POA: Insufficient documentation

## 2019-05-14 DIAGNOSIS — Z79899 Other long term (current) drug therapy: Secondary | ICD-10-CM | POA: Diagnosis not present

## 2019-05-14 DIAGNOSIS — K5900 Constipation, unspecified: Secondary | ICD-10-CM | POA: Diagnosis present

## 2019-05-14 LAB — CBC
HCT: 41.2 % (ref 36.0–46.0)
Hemoglobin: 14 g/dL (ref 12.0–15.0)
MCH: 34.1 pg — ABNORMAL HIGH (ref 26.0–34.0)
MCHC: 34 g/dL (ref 30.0–36.0)
MCV: 100.5 fL — ABNORMAL HIGH (ref 80.0–100.0)
Platelets: 185 10*3/uL (ref 150–400)
RBC: 4.1 MIL/uL (ref 3.87–5.11)
RDW: 12.7 % (ref 11.5–15.5)
WBC: 6.5 10*3/uL (ref 4.0–10.5)
nRBC: 0 % (ref 0.0–0.2)

## 2019-05-14 LAB — COMPREHENSIVE METABOLIC PANEL
ALT: 22 U/L (ref 0–44)
AST: 18 U/L (ref 15–41)
Albumin: 4.4 g/dL (ref 3.5–5.0)
Alkaline Phosphatase: 148 U/L — ABNORMAL HIGH (ref 38–126)
Anion gap: 9 (ref 5–15)
BUN: 10 mg/dL (ref 8–23)
CO2: 26 mmol/L (ref 22–32)
Calcium: 9.9 mg/dL (ref 8.9–10.3)
Chloride: 101 mmol/L (ref 98–111)
Creatinine, Ser: 0.78 mg/dL (ref 0.44–1.00)
GFR calc Af Amer: >60 mL/min (ref >60)
GFR calc non Af Amer: >60 mL/min (ref >60)
Glucose, Bld: 100 mg/dL — ABNORMAL HIGH (ref 70–99)
Potassium: 4 mmol/L (ref 3.5–5.1)
Sodium: 136 mmol/L (ref 135–145)
Total Bilirubin: 1.1 mg/dL (ref 0.3–1.2)
Total Protein: 7.2 g/dL (ref 6.5–8.1)

## 2019-05-14 LAB — URINALYSIS, ROUTINE W REFLEX MICROSCOPIC
Bilirubin Urine: NEGATIVE
Glucose, UA: NEGATIVE mg/dL
Ketones, ur: NEGATIVE mg/dL
Leukocytes,Ua: NEGATIVE
Nitrite: NEGATIVE
Protein, ur: NEGATIVE mg/dL
Specific Gravity, Urine: 1.01 (ref 1.005–1.030)
pH: 7 (ref 5.0–8.0)

## 2019-05-14 LAB — URINALYSIS, MICROSCOPIC (REFLEX)

## 2019-05-14 LAB — LIPASE, BLOOD: Lipase: 23 U/L (ref 11–51)

## 2019-05-14 NOTE — ED Triage Notes (Signed)
Pt arrives POV for eval of ongoing abd pain and cramping. Pt reports she was admitted last week for SBO, d/c'd last week and states she has felt poorly since. Seen at PCP yesterday, SBO on repeat XR done at PCP yesterday and reports low TSH. Pt appears uncomfortable in charge

## 2019-05-15 ENCOUNTER — Emergency Department (HOSPITAL_COMMUNITY): Payer: Medicare Other

## 2019-05-15 MED ORDER — ONDANSETRON 4 MG PO TBDP: ORAL_TABLET | ORAL | 0 refills | Status: DC

## 2019-05-15 MED ORDER — IOHEXOL 300 MG/ML  SOLN
100.0000 mL | Freq: Once | INTRAMUSCULAR | Status: AC | PRN
  Administered 2019-05-15: 100 mL via INTRAVENOUS

## 2019-05-15 NOTE — ED Provider Notes (Signed)
Emergency Department Provider Note   I have reviewed the triage vital signs and the nursing notes.   HISTORY  Chief Complaint Small Bowel Obstruction and Abnormal Lab   HPI [name] is a 63 y.o. female with medical problems documented below who presents the emergency department today secondary to her PCP request.  Patient was recently admitted for small bowel obstruction secondary to adhesions which is resolved.  She went to see her PCP as follow-up and had an x-ray that showed concerns for repeat bowel obstruction and hypothyroidism so sent here for further evaluation.  While waiting in the waiting room patient had multiple episodes of passing gas and her symptoms had actually significantly improved.  She states that over the last 3 to 4 hours she feels better than she has in the whole last week.  No fevers.  No vomiting but does have some intermittent nausea.   At this time no abdominal pain.  No other associated or modifying symptoms.    Past Medical History:  Diagnosis Date  . Anxiety   . Chronic back pain   . COPD (chronic obstructive pulmonary disease) (HCC)   . Tobacco abuse     Patient Active Problem List   Diagnosis Date Noted  . Hypokalemia 05/01/2019  . SBO (small bowel obstruction) (HCC) 04/28/2019  . SIRS (systemic inflammatory response syndrome) (HCC) 04/28/2019  . Tobacco abuse 04/28/2019  . Chronic pain 04/28/2019  . AKI (acute kidney injury) (HCC) 04/28/2019  . Acute respiratory failure (HCC) 08/17/2013  . COPD (chronic obstructive pulmonary disease) (HCC) 08/16/2013    Past Surgical History:  Procedure Laterality Date  . ABDOMINAL HYSTERECTOMY    . BACK SURGERY    . ILEO LOOP NEOBLADDER    . VESICOVAGINAL FISTULA REPAIR      Current Outpatient Rx  . Order #: [id number] Class: Print  . Order #: [id number] Class: Historical Med  . Order #: [id number] Class: Normal  . Order #: [id number] Class: Normal  . Order #: [id number] Class: Normal  . Order #:  [id number] Class: Normal  . Order #: [id number] Class: Normal    Allergies Patient has no known allergies.  History reviewed. No pertinent family history.  Social History Social History   Tobacco Use  . Smoking status: Current Every Day Smoker    Packs/day: 1.00    Years: 40.00    Pack years: 40.00    Types: Cigarettes  Substance Use Topics  . Alcohol use: No  . Drug use: Not on file    Review of Systems  All other systems negative except as documented in the HPI. All pertinent positives and negatives as reviewed in the HPI. ____________________________________________   PHYSICAL EXAM:  VITAL SIGNS: ED Triage Vitals  Enc Vitals Group     BP 05/14/19 1632 135/76     Pulse Rate 05/14/19 1632 80     Resp 05/14/19 1632 18     Temp 05/14/19 1632 98.2 F (36.8 C)     Temp Source 05/14/19 1632 Oral     SpO2 05/14/19 1632 98 %     Weight 05/14/19 1647 112 lb 7 oz (51 kg)     Height 05/14/19 1647 5\' 3"  (1.6 m)    Constitutional: Alert and oriented. Well appearing and in no acute distress. Eyes: Conjunctivae are normal. PERRL. EOMI. Head: Atraumatic. Nose: No congestion/rhinnorhea. Mouth/Throat: Mucous membranes are moist.  Oropharynx non-erythematous. Neck: No stridor.  No meningeal signs.   Cardiovascular: Normal rate, regular rhythm. Good peripheral circulation. Grossly normal heart sounds.  Respiratory: Normal respiratory effort.  No retractions. Lungs CTAB. Gastrointestinal: Soft and nontender. No distention.  Musculoskeletal: No lower extremity tenderness nor edema. No gross deformities of extremities. Neurologic:  Normal speech and language. No gross focal neurologic deficits are appreciated.  Skin:  Skin is warm, dry and intact. No rash noted.  ____________________________________________   LABS (all labs ordered are listed, but only abnormal results are displayed)  Labs Reviewed  COMPREHENSIVE METABOLIC PANEL - Abnormal; Notable for the following  components:      Result Value   Glucose, Bld 100 (*)    Alkaline Phosphatase 148 (*)    All other components within normal limits  CBC - Abnormal; Notable for the following components:   MCV 100.5 (*)    MCH 34.1 (*)    All other components within normal limits  URINALYSIS, ROUTINE W REFLEX MICROSCOPIC - Abnormal; Notable for the following components:   Hgb urine dipstick SMALL (*)    All other components within normal limits  URINALYSIS, MICROSCOPIC (REFLEX) - Abnormal; Notable for the following components:   Bacteria, UA RARE (*)    All other components within normal limits  LIPASE, BLOOD   ____________________________________________  EKG   EKG Interpretation  Date/Time:    Ventricular Rate:    PR Interval:    QRS Duration:   QT Interval:    QTC Calculation:   R Axis:     Text Interpretation:         ____________________________________________  RADIOLOGY  Dg Abdomen 1 View  Result Date: 05/14/2019 CLINICAL DATA:  Rule out small-bowel obstruction. EXAM: ABDOMEN - 1 VIEW COMPARISON:  April 29, 2019 FINDINGS: Evaluation for free air is limited on supine imaging but no free air seen. No portal venous gas or pneumatosis noted. The bowel gas pattern is nonobstructive in appearance. No other acute abnormalities are identified. IMPRESSION: No evidence of small bowel obstruction on this study. Electronically Signed   By: Gerome Sam III M.D   On: 05/14/2019 22:56   Ct Abdomen Pelvis W Contrast  Result Date: 05/15/2019 CLINICAL DATA:  Abdominal pain and cramping EXAM: CT ABDOMEN AND PELVIS WITH CONTRAST TECHNIQUE: Multidetector CT imaging of the abdomen and pelvis was performed using the standard protocol following bolus administration of intravenous contrast. CONTRAST:  OMNIPAQUE IOHEXOL 300 MG/ML  SOLN COMPARISON:  April 28, 2019 FINDINGS: Lower chest: The visualized heart size within normal limits. No pericardial fluid/thickening. No hiatal hernia. The  visualized portions of the lungs are clear. Hepatobiliary: Again noted are coarse calcifications within the right liver and caudate.The main portal vein is patent. No evidence of calcified gallstones, gallbladder wall thickening or biliary dilatation. Pancreas: Unremarkable. No pancreatic ductal dilatation or surrounding inflammatory changes. Spleen: Normal in size without focal abnormality. Adrenals/Urinary Tract: Both adrenal glands appear normal. The kidneys and collecting system appear normal without evidence of urinary tract calculus or hydronephrosis. 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 Stomach/Bowel: The stomach is normal in appearance. There is a mildly prominent fluid and air-filled loop of jejunum within the mid abdomen measuring 3 cm. No focal area of stricture or narrowing is seen. The remainder of the distal loops of small bowel appear to be decompressed. There is a moderate amount of col for onic stool with air and stool seen within a mildly dilated rectum. Vascular/Lymphatic: There are no enlarged mesenteric, retroperitoneal, or pelvic lymph nodes. Scattered aortic atherosclerotic calcifications are seen without aneurysmal dilatation. Reproductive: The patient is status post hysterectomy. Other: No evidence of abdominal wall  mass or hernia. Musculoskeletal: No acute or significant osseous findings. The patient has had a prior interbody fusion at L4-L5 and L5-S1. IMPRESSION: 1. Mildly dilated fluid and air-filled loops of jejunum within the mid abdomen without an area of narrowing or stricture. This could represent a focal ileus. The distal small bowel loops are decompressed. 2. Moderate amount of colonic stool with a mildly dilated air and stool-filled rectum. 3.  Aortic Atherosclerosis (ICD10-I70.0). Electronically Signed   By: Prudencio Pair M.D.   On: 05/15/2019 01:33    ____________________________________________   PROCEDURES  Procedure(s) performed:   Procedures    ____________________________________________   INITIAL IMPRESSION / ASSESSMENT AND PLAN / ED COURSE  Work-up as above.  No indication for admission.  Patient is tolerating p.o.  She is passing gas.  She actually feels much better than she has recently.  Suspect that the ileus is causing a problem.  I discussed with her ways to combat that.  She also has hypothyroidism which could be related to the ileus but at this time I do not have enough information to initiate treatment so she can follow-up with her primary doctor for that.  Discussed return precautions.  Patient stable for discharge at this time.     Pertinent labs & imaging results that were available during my care of the patient were reviewed by me and considered in my medical decision making (see chart for details).  A medical screening exam was performed and I feel the patient has had an appropriate workup for their chief complaint at this time and likelihood of emergent condition existing is low. They have been counseled on decision, discharge, follow up and which symptoms necessitate immediate return to the emergency department. They or their family verbally stated understanding and agreement with plan and discharged in stable condition.   ____________________________________________  FINAL CLINICAL IMPRESSION(S) / ED DIAGNOSES  Final diagnoses:  Constipation, unspecified constipation type  Ileus ([place])  Hypothyroidism, unspecified type     MEDICATIONS GIVEN DURING THIS VISIT:  Medications  iohexol (OMNIPAQUE) 300 MG/ML solution 100 mL (100 mLs Intravenous Contrast Given 05/15/19 0111)     NEW OUTPATIENT MEDICATIONS STARTED DURING THIS VISIT:  New Prescriptions   No medications on file    Note:  This note was prepared with assistance of Dragon voice recognition software. Occasional wrong-word or sound-a-like substitutions may have occurred due to the inherent limitations of voice recognition software.   Yahaira Bruski, Corene Cornea,  MD 05/15/19 [phone]

## 2019-05-15 NOTE — Discharge Instructions (Signed)

## 2019-08-16 ENCOUNTER — Encounter: Payer: Self-pay | Admitting: Emergency Medicine

## 2019-08-16 ENCOUNTER — Ambulatory Visit (INDEPENDENT_AMBULATORY_CARE_PROVIDER_SITE_OTHER): Payer: Medicare Other

## 2019-08-16 ENCOUNTER — Other Ambulatory Visit: Payer: Self-pay

## 2019-08-16 ENCOUNTER — Ambulatory Visit: Payer: Medicare Other | Admitting: Emergency Medicine

## 2019-08-16 VITALS — BP 124/62 | HR 83 | Temp 97.1°F | Ht 61.0 in | Wt 114.0 lb

## 2019-08-16 DIAGNOSIS — J449 Chronic obstructive pulmonary disease, unspecified: Secondary | ICD-10-CM | POA: Diagnosis not present

## 2019-08-16 DIAGNOSIS — R0600 Dyspnea, unspecified: Secondary | ICD-10-CM

## 2019-08-16 DIAGNOSIS — Z720 Tobacco use: Secondary | ICD-10-CM

## 2019-08-16 MED ORDER — SPIRIVA RESPIMAT 2.5 MCG/ACT IN AERS: 2.0000 | INHALATION_SPRAY | Freq: Every day | RESPIRATORY_TRACT | 0 refills | Status: DC

## 2019-08-16 NOTE — Progress Notes (Signed)
Subjective:    Patient ID: [name], female    DOB: 1955-08-20, 64 y.o.   MRN: [id number]  HPI 64 year old former smoker (40 pack years) with a history of chronic back pain, anxiety, history of interstitial cystitis, hx transaminitis, ? Etiology (bx negative).  She carries a diagnosis of COPD that was made initially based on emphysematous changes seen on CT imaging from 2011, then a hospitalization for probable AE-COPD in 2015. She quit smoking in November 2020.   Currently managed on Symbicort, has been on this for over a year, albuterol as needed which she uses about 3x a day. She has daily exertional SOB - very little work required to get her SOB, sweeping, housework. She is able to shop at her pace. She was on O2 after her 2015 hospitalization - then she gave it back.    Review of Systems  Constitutional: Positive for unexpected weight change. Negative for fever.  HENT: Positive for sinus pressure. Negative for congestion, dental problem, ear pain, nosebleeds, postnasal drip, rhinorrhea, sneezing, sore throat and trouble swallowing.   Eyes: Negative for redness and itching.  Respiratory: Positive for shortness of breath and wheezing. Negative for cough and chest tightness.   Cardiovascular: Negative for palpitations and leg swelling.  Gastrointestinal: Negative for nausea and vomiting.  Genitourinary: Negative for dysuria.  Musculoskeletal: Negative for joint swelling.  Skin: Negative for rash.  Allergic/Immunologic: Negative.  Negative for environmental allergies, food allergies and immunocompromised state.  Neurological: Negative for headaches.  Hematological: Bruises/bleeds easily.  Psychiatric/Behavioral: Negative for dysphoric mood. The patient is not nervous/anxious.    Past Medical History:  Diagnosis Date  . Anxiety   . Chronic back pain   . COPD (chronic obstructive pulmonary disease) (HCC)   . Tobacco abuse    Hx interstitial cystitis, surgical repair  History  reviewed. No pertinent family history.   Social History   Socioeconomic History  . Marital status: Married    Spouse name: Not on file  . Number of children: Not on file  . Years of education: Not on file  . Highest education level: Not on file  Occupational History  . Not on file  Tobacco Use  . Smoking status: Former Smoker    Packs/day: 1.00    Years: 40.00    Pack years: 40.00    Types: Cigarettes    Quit date: 04/11/2019    Years since quitting: 0.3  . Smokeless tobacco: Never Used  Substance and Sexual Activity  . Alcohol use: No  . Drug use: Not on file  . Sexual activity: Yes  Other Topics Concern  . Not on file  Social History Narrative  . Not on file   Social Determinants of Health   Financial Resource Strain:   . Difficulty of Paying Living Expenses: Not on file  Food Insecurity:   . Worried About Programme researcher, broadcasting/film/video in the Last Year: Not on file  . Ran Out of Food in the Last Year: Not on file  Transportation Needs:   . Lack of Transportation (Medical): Not on file  . Lack of Transportation (Non-Medical): Not on file  Physical Activity:   . Days of Exercise per Week: Not on file  . Minutes of Exercise per Session: Not on file  Stress:   . Feeling of Stress : Not on file  Social Connections:   . Frequency of Communication with Friends and Family: Not on file  . Frequency of Social Gatherings with Friends and Family:  Not on file  . Attends Religious Services: Not on file  . Active Member of Clubs or Organizations: Not on file  . Attends Banker Meetings: Not on file  . Marital Status: Not on file  Intimate Partner Violence:   . Fear of Current or Ex-Partner: Not on file  . Emotionally Abused: Not on file  . Physically Abused: Not on file  . Sexually Abused: Not on file     No Known Allergies   Outpatient Medications Prior to Visit  Medication Sig Dispense Refill  . albuterol (PROVENTIL HFA;VENTOLIN HFA) 108 (90 BASE) MCG/ACT  inhaler Inhale 2 puffs into the lungs every 6 (six) hours as needed for wheezing or shortness of breath. 1 Inhaler 2  . ALPRAZolam (XANAX) 1 MG tablet Take 0.5-1 mg by mouth at bedtime as needed for sleep or anxiety.    . budesonide-formoterol (SYMBICORT) 160-4.5 MCG/ACT inhaler Inhale 2 puffs into the lungs 2 (two) times daily.    Marland Kitchen docusate sodium (COLACE) 100 MG capsule Take 1 capsule (100 mg total) by mouth 2 (two) times daily. 10 capsule 0  . pantoprazole (PROTONIX) 40 MG tablet Take 1 tablet (40 mg total) by mouth daily. 30 tablet 1  . polyethylene glycol (MIRALAX / GLYCOLAX) 17 g packet Take 17 g by mouth daily. 14 each 0  . simethicone (MYLICON) 80 MG chewable tablet Chew 1 tablet (80 mg total) by mouth 3 (three) times daily. 30 tablet 0  . ondansetron (ZOFRAN ODT) 4 MG disintegrating tablet 4mg  ODT q4 hours prn nausea/vomit 30 tablet 0  . ondansetron (ZOFRAN) 4 MG tablet Take 1 tablet (4 mg total) by mouth every 8 (eight) hours as needed for nausea or vomiting. (Patient not taking: Reported on 08/16/2019) 30 tablet 1   No facility-administered medications prior to visit.        Objective:   Physical Exam  Vitals:   08/16/19 1357  BP: 124/62  Pulse: 83  Temp: (!) 97.1 F (36.2 C)  TempSrc: Temporal  SpO2: 100%  Weight: 114 lb (51.7 kg)  Height: 5\' 1"  (1.549 m)   Gen: Pleasant, thin, in no distress,  normal affect  ENT: No lesions,  mouth clear,  oropharynx clear, no postnasal drip  Neck: No JVD, no stridor  Lungs: No use of accessory muscles, no crackles or wheezing on normal respiration, soft end exp wheeze on forced expiration  Cardiovascular: RRR, heart sounds normal, no murmur or gallops, no peripheral edema  Musculoskeletal: No deformities, no cyanosis. She has curved nails L>R without overt clubbing.   Neuro: alert, awake, non focal  Skin: Warm, no lesions or rash      Assessment & Plan:  COPD (chronic obstructive pulmonary disease) (HCC) Suspected COPD  based on her imaging, symptoms, response to bronchodilator in the past.  She also had a hospitalization in 2015 that sounded like an AE-COPD.  Moderate to poor control of her exertional dyspnea.  She needs to be assessed for possible exertional hypoxemia.  Also check alpha-1 antitrypsin genotype and phenotype, especially relevant given her history of unexplained transaminitis in the past.  Continue Symbicort 2 puffs twice daily.  Rinse and gargle after using. We will start Spiriva Respimat, 2 puffs once daily.  Keep track of whether this medication improves your breathing Keep your albuterol available to use 2 puffs if needed for shortness of breath, chest tightness, wheezing. We will perform a walking oximetry at your next office visit. We will perform pulmonary function testing at  your next office visit Chest x-ray today Lab work today We will refer you to the lung cancer screening program. Follow with Dr Lamonte Sakai in 1 month or next available with full PFT on the same day.  Tobacco abuse She stopped smoking in November 2020.  I congratulated her on this.  She qualifies for and is interested in the lung cancer screening program.  I will make the referral today.  Baltazar Apo, MD, PhD 08/16/2019, 2:35 PM [facility] [phone] or if no answer [phone]

## 2019-08-16 NOTE — Progress Notes (Signed)
   Subjective:    Patient ID: [name], female    DOB: 1955/09/26, 64 y.o.   MRN: [id number]  HPI    Review of Systems     Objective:   Physical Exam        Assessment & Plan:

## 2019-08-16 NOTE — Patient Instructions (Signed)
Continue Symbicort 2 puffs twice daily.  Rinse and gargle after using. We will start Spiriva Respimat, 2 puffs once daily.  Keep track of whether this medication improves your breathing Keep your albuterol available to use 2 puffs if needed for shortness of breath, chest tightness, wheezing. We will perform a walking oximetry at your next office visit. We will perform pulmonary function testing at your next office visit Chest x-ray today Lab work today We will refer you to the lung cancer screening program. Follow with Dr Delton Coombes in 1 month or next available with full PFT on the same day.

## 2019-08-16 NOTE — Assessment & Plan Note (Signed)
Suspected COPD based on her imaging, symptoms, response to bronchodilator in the past.  She also had a hospitalization in 2015 that sounded like an AE-COPD.  Moderate to poor control of her exertional dyspnea.  She needs to be assessed for possible exertional hypoxemia.  Also check alpha-1 antitrypsin genotype and phenotype, especially relevant given her history of unexplained transaminitis in the past.  Continue Symbicort 2 puffs twice daily.  Rinse and gargle after using. We will start Spiriva Respimat, 2 puffs once daily.  Keep track of whether this medication improves your breathing Keep your albuterol available to use 2 puffs if needed for shortness of breath, chest tightness, wheezing. We will perform a walking oximetry at your next office visit. We will perform pulmonary function testing at your next office visit Chest x-ray today Lab work today We will refer you to the lung cancer screening program. Follow with Dr Delton Coombes in 1 month or next available with full PFT on the same day.

## 2019-08-16 NOTE — Addendum Note (Signed)
Addended by: Demetrio Lapping E on: 08/16/2019 02:51 PM   Modules accepted: Orders

## 2019-08-16 NOTE — Assessment & Plan Note (Signed)
She stopped smoking in November 2020.  I congratulated her on this.  She qualifies for and is interested in the lung cancer screening program.  I will make the referral today.

## 2019-08-23 ENCOUNTER — Other Ambulatory Visit: Payer: Self-pay | Admitting: *Deleted

## 2019-08-23 DIAGNOSIS — Z87891 Personal history of nicotine dependence: Secondary | ICD-10-CM

## 2019-08-25 LAB — ALPHA-1 ANTITRYPSIN PHENOTYPE: A-1 Antitrypsin, Ser: 84 mg/dL (ref 83–199)

## 2019-09-20 ENCOUNTER — Ambulatory Visit (INDEPENDENT_AMBULATORY_CARE_PROVIDER_SITE_OTHER): Payer: Medicare Other | Admitting: Acute Care

## 2019-09-20 ENCOUNTER — Encounter: Payer: Self-pay | Admitting: Acute Care

## 2019-09-20 ENCOUNTER — Ambulatory Visit (INDEPENDENT_AMBULATORY_CARE_PROVIDER_SITE_OTHER)
Admission: RE | Admit: 2019-09-20 | Discharge: 2019-09-20 | Disposition: A | Discharge status: Home / Self Care | Payer: Medicare Other | Source: Ambulatory Visit | Attending: Acute Care | Admitting: Acute Care

## 2019-09-20 ENCOUNTER — Other Ambulatory Visit: Payer: Self-pay

## 2019-09-20 DIAGNOSIS — Z87891 Personal history of nicotine dependence: Secondary | ICD-10-CM | POA: Diagnosis not present

## 2019-09-20 NOTE — Patient Instructions (Signed)
Thank you for participating in the [facility]. It was our pleasure to meet you today. We will call you with the results of your scan within the next few days. Your scan will be assigned a Lung RADS category score by the physicians reading the scans.  This Lung RADS score determines follow up scanning.  See below for description of categories, and follow up screening recommendations. We will be in touch to schedule your follow up screening annually or based on recommendations of our providers. We will fax a copy of your scan results to your Primary Care Physician, or the physician who referred you to the program, to ensure they have the results. Please call the office if you have any questions or concerns regarding your scanning experience or results.  Our office number is [phone]. Please speak with Denise Phelps, RN. She is our Lung Cancer Screening RN. If she is unavailable when you call, please have the office staff send her a message. She will return your call at her earliest convenience. Remember, if your scan is normal, we will scan you annually as long as you continue to meet the criteria for the program. (Age 55-77, Current smoker or smoker who has quit within the last 15 years). If you are a smoker, remember, quitting is the single most powerful action that you can take to decrease your risk of lung cancer and other pulmonary, breathing related problems. We know quitting is hard, and we are here to help.  Please let us know if there is anything we can do to help you meet your goal of quitting. If you are a former smoker, congratulations. We are proud of you! Remain smoke free! Remember you can refer friends or family members through the number above.  We will screen them to make sure they meet criteria for the program. Thank you for helping us take better care of you by participating in Lung Screening.  Lung RADS Categories:  Lung RADS 1: no nodules  or definitely non-concerning nodules.  Recommendation is for a repeat annual scan in 12 months.  Lung RADS 2:  nodules that are non-concerning in appearance and behavior with a very low likelihood of becoming an active cancer. Recommendation is for a repeat annual scan in 12 months.  Lung RADS 3: nodules that are probably non-concerning , includes nodules with a low likelihood of becoming an active cancer.  Recommendation is for a 6-month repeat screening scan. Often noted after an upper respiratory illness. We will be in touch to make sure you have no questions, and to schedule your 6-month scan.  Lung RADS 4 A: nodules with concerning findings, recommendation is most often for a follow up scan in 3 months or additional testing based on our provider's assessment of the scan. We will be in touch to make sure you have no questions and to schedule the recommended 3 month follow up scan.  Lung RADS 4 B:  indicates findings that are concerning. We will be in touch with you to schedule additional diagnostic testing based on our provider's  assessment of the scan.   

## 2019-09-20 NOTE — Progress Notes (Signed)
Shared Decision Making Visit Lung Cancer Screening Program [phone])   Eligibility:  Age 64 y.o.  Pack Years Smoking History Calculation  90 pack year smoking history (# packs/per year x # years smoked)  Recent History of coughing up blood  no  Unexplained weight loss? no ( >Than 15 pounds within the last 6 months )  Prior History Lung / other cancer no (Diagnosis within the last 5 years already requiring surveillance chest CT Scans).  Smoking Status Former Smoker  Former Smokers: Years since quit: < 1 year  Quit Date: 04/2019  Visit Components:  Discussion included one or more decision making aids. yes  Discussion included risk/benefits of screening. yes  Discussion included potential follow up diagnostic testing for abnormal scans. yes  Discussion included meaning and risk of over diagnosis. yes  Discussion included meaning and risk of False Positives. yes  Discussion included meaning of total radiation exposure. yes  Counseling Included:  Importance of adherence to annual lung cancer LDCT screening. yes  Impact of comorbidities on ability to participate in the program. yes  Ability and willingness to under diagnostic treatment. yes  Smoking Cessation Counseling:  Current Smokers:   Discussed importance of smoking cessation. yes  Information about tobacco cessation classes and interventions provided to patient. yes  Patient provided with "ticket" for LDCT Scan. yes  Symptomatic Patient. no  Counseling: NA  Diagnosis Code: Tobacco Use Z72.0  Asymptomatic Patient yes  Counseling (Intermediate counseling: > three minutes counseling) [id number]  Former Smokers:   Discussed the importance of maintaining cigarette abstinence. yes  Diagnosis Code: Personal History of Nicotine Dependence. G99.242  Information about tobacco cessation classes and interventions provided to patient. Yes  Patient provided with "ticket" for LDCT Scan. yes  Written Order for Lung  Cancer Screening with LDCT placed in Epic. Yes (CT Chest Lung Cancer Screening Low Dose W/O CM) AST4196 Z12.2-Screening of respiratory organs Z87.891-Personal history of nicotine dependence  I spent 25 minutes of face to face time with Ms. Ettinger discussing the risks and benefits of lung cancer screening. We viewed a power point together that explained in detail the above noted topics. We took the time to pause the power point at intervals to allow for questions to be asked and answered to ensure understanding. We discussed that she had taken the single most powerful action possible to decrease her risk of developing lung cancer when she quit smoking. I counseled her to remain smoke free, and to contact me if she ever had the desire to smoke again so that I can provide resources and tools to help support the effort to remain smoke free. We discussed the time and location of the scan, and that either  Doroteo Glassman RN or I will call with the results within  24-48 hours of receiving them. She has my card and contact information in the event she needs to speak with me, in addition to a copy of the power point we reviewed as a resource. She verbalized understanding of all of the above and had no further questions upon leaving the office.     I explained to the patient that there has been a high incidence of coronary artery disease noted on these exams. I explained that this is a non-gated exam therefore degree or severity cannot be determined. This patient is not currently on statin therapy. I have asked the patient to follow-up with their PCP regarding any incidental finding of coronary artery disease and management with diet or medication as  they feel is clinically indicated. The patient verbalized understanding of the above and had no further questions.     Bevelyn Ngo, NP 09/20/2019

## 2019-09-21 NOTE — Progress Notes (Signed)
Please call patient and let them  know their  low dose Ct was read as a Lung RADS 2: nodules that are benign in appearance and behavior with a very low likelihood of becoming a clinically active cancer due to size or lack of growth. Recommendation per radiology is for a repeat LDCT in 12 months. .Please let them  know we will order and schedule their  annual screening scan for 09/2020. Please let them  know there was notation of CAD on their  scan.  Please remind the patient  that this is a non-gated exam therefore degree or severity of disease  cannot be determined. Please have them  follow up with their PCP regarding potential risk factor modification, dietary therapy or pharmacologic therapy if clinically indicated. Pt.  is  not currently on statin therapy. Please place order for annual  screening scan for  09/2020 and fax results to PCP. Thanks so much. 

## 2019-09-22 ENCOUNTER — Other Ambulatory Visit: Payer: Self-pay | Admitting: *Deleted

## 2019-09-22 DIAGNOSIS — Z87891 Personal history of nicotine dependence: Secondary | ICD-10-CM

## 2019-10-29 ENCOUNTER — Other Ambulatory Visit (HOSPITAL_COMMUNITY)
Admission: RE | Admit: 2019-10-29 | Discharge: 2019-10-29 | Disposition: A | Discharge status: Home / Self Care | Payer: Medicare Other | Source: Ambulatory Visit | Attending: Primary Care | Admitting: Primary Care

## 2019-10-29 ENCOUNTER — Other Ambulatory Visit: Payer: Self-pay

## 2019-10-29 DIAGNOSIS — Z20822 Contact with and (suspected) exposure to covid-19: Secondary | ICD-10-CM | POA: Insufficient documentation

## 2019-10-29 DIAGNOSIS — Z01812 Encounter for preprocedural laboratory examination: Secondary | ICD-10-CM | POA: Diagnosis present

## 2019-10-30 LAB — SARS CORONAVIRUS 2 (TAT 6-24 HRS): SARS Coronavirus 2: NEGATIVE

## 2019-11-01 ENCOUNTER — Ambulatory Visit: Payer: Medicare Other | Admitting: Primary Care

## 2019-11-01 ENCOUNTER — Ambulatory Visit: Payer: Medicare Other | Admitting: Adult Health

## 2019-11-01 ENCOUNTER — Encounter: Payer: Self-pay | Admitting: Adult Health

## 2019-11-01 ENCOUNTER — Ambulatory Visit (INDEPENDENT_AMBULATORY_CARE_PROVIDER_SITE_OTHER): Payer: Medicare Other | Admitting: Emergency Medicine

## 2019-11-01 ENCOUNTER — Other Ambulatory Visit: Payer: Self-pay

## 2019-11-01 VITALS — BP 132/72 | HR 89 | Temp 97.2°F | Ht 62.0 in | Wt 123.0 lb

## 2019-11-01 DIAGNOSIS — R0600 Dyspnea, unspecified: Secondary | ICD-10-CM

## 2019-11-01 DIAGNOSIS — J449 Chronic obstructive pulmonary disease, unspecified: Secondary | ICD-10-CM

## 2019-11-01 LAB — PULMONARY FUNCTION TEST
DL/VA % pred: 78 %
DL/VA: 3.37 ml/min/mmHg/L
DLCO cor % pred: 63 %
DLCO cor: 11.99 ml/min/mmHg
DLCO unc % pred: 63 %
DLCO unc: 11.99 ml/min/mmHg
FEF 25-75 Post: 0.46 L/sec
FEF 25-75 Pre: 0.31 L/sec
FEF2575-%Change-Post: 50 %
FEF2575-%Pred-Post: 20 %
FEF2575-%Pred-Pre: 13 %
FEV1-%Change-Post: 18 %
FEV1-%Pred-Post: 38 %
FEV1-%Pred-Pre: 32 %
FEV1-Post: 0.89 L
FEV1-Pre: 0.75 L
FEV1FVC-%Change-Post: 5 %
FEV1FVC-%Pred-Pre: 62 %
FEV6-%Change-Post: 11 %
FEV6-%Pred-Post: 58 %
FEV6-%Pred-Pre: 52 %
FEV6-Post: 1.7 L
FEV6-Pre: 1.52 L
FEV6FVC-%Change-Post: -1 %
FEV6FVC-%Pred-Post: 100 %
FEV6FVC-%Pred-Pre: 101 %
FVC-%Change-Post: 12 %
FVC-%Pred-Post: 57 %
FVC-%Pred-Pre: 51 %
FVC-Post: 1.75 L
FVC-Pre: 1.55 L
Post FEV1/FVC ratio: 51 %
Post FEV6/FVC ratio: 97 %
Pre FEV1/FVC ratio: 48 %
Pre FEV6/FVC Ratio: 98 %
RV % pred: 237 %
RV: 4.54 L
TLC % pred: 131 %
TLC: 6.23 L

## 2019-11-01 MED ORDER — TIOTROPIUM BROMIDE MONOHYDRATE 18 MCG IN CAPS
18.0000 ug | ORAL_CAPSULE | Freq: Every day | RESPIRATORY_TRACT | 6 refills | Status: DC
Start: 2019-11-01 — End: 2020-07-17

## 2019-11-01 MED ORDER — ALBUTEROL SULFATE HFA 108 (90 BASE) MCG/ACT IN AERS
2.0000 | INHALATION_SPRAY | Freq: Four times a day (QID) | RESPIRATORY_TRACT | 5 refills | Status: DC | PRN
Start: 2019-11-01 — End: 2020-07-06

## 2019-11-01 MED ORDER — BUDESONIDE-FORMOTEROL FUMARATE 160-4.5 MCG/ACT IN AERO
2.0000 | INHALATION_SPRAY | Freq: Two times a day (BID) | RESPIRATORY_TRACT | 6 refills | Status: DC
Start: 2019-11-01 — End: 2020-12-19

## 2019-11-01 NOTE — Progress Notes (Signed)
@Patient  ID: , female    DOB: 1955/09/04, 64 y.o.   MRN: [id number]  Chief Complaint  Patient presents with  . Follow-up    COPD     Referring provider: [id number], NP  HPI: 64 year old female former smoker (90-pack-year)  quit November 2020 seen for pulmonary consult August 16, 2019 for suspected COPD.  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  Patient was seen for pulmonary consult March 2021 for suspected COPD as was a previous heavy smoker. She was set up for pulmonary function testing today shows severe COPD with an FEV1 at 38%, ratio 51, FVC 57%, positive bronchodilator response, severe mid flow obstruction with significant reversibility, DLCO 63% Walk test in the office shows O2 saturations are adequate walking in the office on room air.  O2 saturations 94 to 96% on room air. She was continued on Symbicort twice daily.  And Spiriva was added.  Patient says she did take the sample but did not get the prescription filled. Has received both Covid vaccines  Alpha one testing showed MZ phenotype with level at low normal 84.  No known alpha 1 in family . No formal dx of COPD or emphysema in family  She says she does have a cough during the daytime that is minimally productive.  Gets short of breath with heavy activity..  Does not exercise on a regular basis.  She has been able to quit smoking.  We discussed smoking cessation.   No Known Allergies  Immunization History  Administered Date(s) Administered  . Influenza,inj,Quad PF,6+ Mos 05/02/2019  . PFIZER SARS-COV-2 Vaccination 09/01/2019, 09/22/2019  . Pneumococcal Polysaccharide-23 08/17/2013, 05/02/2019    Past Medical History:  Diagnosis Date  . Anxiety   .  Chronic back pain   . COPD (chronic obstructive pulmonary disease) (HCC)   . Tobacco abuse     Tobacco History: Social History   Tobacco Use  Smoking Status Former Smoker  . Packs/day: 2.00  . Years: 45.00  . Pack years: 90.00  . Types: Cigarettes  . Quit date: 04/11/2019  . Years since quitting: 0.5  Smokeless Tobacco Never Used   Counseling given: Not Answered   Outpatient Medications Prior to Visit  Medication Sig Dispense Refill  . albuterol (PROVENTIL HFA;VENTOLIN HFA) 108 (90 BASE) MCG/ACT inhaler Inhale 2 puffs into the lungs every 6 (six) hours as needed for wheezing or shortness of breath. 1 Inhaler 2  . ALPRAZolam (XANAX) 1 MG tablet Take 0.5-1 mg by mouth at bedtime as needed for sleep or anxiety.    . budesonide-formoterol (SYMBICORT) 160-4.5 MCG/ACT inhaler Inhale 2 puffs into the lungs 2 (two) times daily.    13/06/2018 docusate sodium (COLACE) 100 MG capsule Take 1 capsule (100 mg total) by mouth 2 (two) times daily. 10 capsule 0  . ondansetron (ZOFRAN) 4 MG tablet Take 1 tablet (4 mg total) by mouth every 8 (eight) hours as needed for nausea or vomiting. 30 tablet 1  . pantoprazole (PROTONIX) 40 MG tablet Take 1 tablet (40 mg total) by mouth daily. 30 tablet 1  . polyethylene glycol (MIRALAX / GLYCOLAX) 17 g packet Take 17 g by mouth daily. 14 each 0  . simethicone (MYLICON) 80 MG chewable tablet Chew 1 tablet (80 mg total) by  mouth 3 (three) times daily. 30 tablet 0  . Tiotropium Bromide Monohydrate (SPIRIVA RESPIMAT) 2.5 MCG/ACT AERS Inhale 2 puffs into the lungs daily. (Patient not taking: Reported on 11/01/2019) 4 g 0   No facility-administered medications prior to visit.     Review of Systems:   Constitutional:   No  weight loss, night sweats,  Fevers, chills, fatigue, or  lassitude.  HEENT:   No headaches,  Difficulty swallowing,  Tooth/dental problems, or  Sore throat,                No sneezing, itching, ear ache, nasal congestion, post nasal drip,   CV:  No  chest pain,  Orthopnea, PND, swelling in lower extremities, anasarca, dizziness, palpitations, syncope.   GI  No heartburn, indigestion, abdominal pain, nausea, vomiting, diarrhea, change in bowel habits, loss of appetite, bloody stools.   Resp: No shortness of breath with exertion or at rest.  No excess mucus, no productive cough,  No non-productive cough,  No coughing up of blood.  No change in color of mucus.  No wheezing.  No chest wall deformity  Skin: no rash or lesions.  GU: no dysuria, change in color of urine, no urgency or frequency.  No flank pain, no hematuria   MS:  No joint pain or swelling.  No decreased range of motion.  No back pain.    Physical Exam  BP 132/72 (BP Location: Left Arm, Patient Position: Sitting, Cuff Size: Normal)   Pulse 89   Temp (!) 97.2 F (36.2 C) (Oral)   Ht 5\' 2"  (1.575 m)   Wt 123 lb (55.8 kg)   SpO2 100%   BMI 22.50 kg/m   GEN: A/Ox3; pleasant , NAD, BMI 22   HEENT:  [place]/AT,   NOSE-clear, THROAT-clear, no lesions, no postnasal drip or exudate noted.   NECK:  Supple w/ fair ROM; no JVD; normal carotid impulses w/o bruits; no thyromegaly or nodules palpated; no lymphadenopathy.    RESP  Clear  P & A; w/o, wheezes/ rales/ or rhonchi. no accessory muscle use, no dullness to percussion  CARD:  RRR, no m/r/g, no peripheral edema, pulses intact, no cyanosis or clubbing.  GI:   Soft & nt; nml bowel sounds; no organomegaly or masses detected.   Musco: Warm bil, no deformities or joint swelling noted.   Neuro: alert, no focal deficits noted.    Skin: Warm, no lesions or rashes    Lab Results:    BNP No results found for: BNP  ProBNP No results found for: PROBNP  Imaging: No results found.    PFT Results Latest Ref Rng & Units 11/01/2019  FVC-Pre L 1.55  FVC-Predicted Pre % 51  FVC-Post L 1.75  FVC-Predicted Post % 57  Pre FEV1/FVC % % 48  Post FEV1/FCV % % 51  FEV1-Pre L 0.75  FEV1-Predicted Pre % 32  FEV1-Post L 0.89   DLCO UNC% % 63  DLCO COR %Predicted % 78  TLC L 6.23  TLC % Predicted % 131  RV % Predicted % 237    No results found for: NITRICOXIDE      Assessment & Plan:   No problem-specific Assessment & Plan notes found for this encounter.     Rexene Edison, NP 11/01/2019

## 2019-11-01 NOTE — Progress Notes (Signed)
Full PFT performed today. °

## 2019-11-01 NOTE — Patient Instructions (Addendum)
Continue on Symbicort 2 puffs twice daily, rinse after use Restart Spiriva 2 puffs daily Activity as tolerated Great job not smoking Refer to Pulmonary Rehab.  Activity as tolerated.  Albuterol inhaler As needed  Wheezing /shortness of breath .  Follow up with Dr. Delton Coombes in 3-4 months and As needed

## 2019-11-02 ENCOUNTER — Other Ambulatory Visit: Payer: Self-pay | Admitting: Adult Health

## 2019-11-02 DIAGNOSIS — R0600 Dyspnea, unspecified: Secondary | ICD-10-CM

## 2019-11-02 NOTE — Assessment & Plan Note (Signed)
Severe COPD with an FEV1 at 38% We discussed her pulmonary function testing.  She also has alpha 1 phenotype MZ.  Her level is low normal.  We will continue to follow closely as she could benefit from supplement if level continues to drop. We discussed pulmonary rehab.  Congratulated on smoking cessation. Would restart Spiriva and continue on Symbicort.  Plan  Patient Instructions  Continue on Symbicort 2 puffs twice daily, rinse after use Restart Spiriva 2 puffs daily Activity as tolerated Great job not smoking Refer to Pulmonary Rehab.  Activity as tolerated.  Albuterol inhaler As needed  Wheezing /shortness of breath .  Follow up with Dr. Delton Coombes in 3-4 months and As needed

## 2019-11-03 ENCOUNTER — Telehealth (HOSPITAL_COMMUNITY): Payer: Self-pay | Admitting: *Deleted

## 2019-11-03 NOTE — Telephone Encounter (Signed)
Received referral from Dr. Delton Coombes for this pt to participate in pulmonary rehab.  Noted that pt lives in [place].  Called and left pt message for advisement  which location she would prefer  [facility] or [place].  Contact information provided. Alanson Aly, BSN Cardiac and Emergency planning/management officer

## 2019-11-05 ENCOUNTER — Telehealth (HOSPITAL_COMMUNITY): Payer: Self-pay | Admitting: *Deleted

## 2019-11-05 NOTE — Telephone Encounter (Signed)
Called and left second message.  This message was left on the mobile number to please contact [facility] in regards to a referral that was placed by Dr. Delton Coombes contact information provided. Alanson Aly, BSN Cardiac and Emergency planning/management officer

## 2020-02-03 ENCOUNTER — Ambulatory Visit: Payer: Medicare Other | Admitting: Adult Health

## 2020-07-06 ENCOUNTER — Other Ambulatory Visit: Payer: Self-pay | Admitting: Adult Health

## 2020-07-15 ENCOUNTER — Other Ambulatory Visit: Payer: Self-pay | Admitting: Adult Health

## 2020-08-11 ENCOUNTER — Other Ambulatory Visit: Payer: Self-pay | Admitting: Adult Health

## 2020-10-14 ENCOUNTER — Other Ambulatory Visit: Payer: Self-pay | Admitting: Adult Health

## 2020-11-15 ENCOUNTER — Other Ambulatory Visit: Payer: Self-pay

## 2020-11-15 ENCOUNTER — Other Ambulatory Visit: Payer: Self-pay | Admitting: *Deleted

## 2020-11-15 ENCOUNTER — Encounter: Payer: Self-pay | Admitting: Emergency Medicine

## 2020-11-15 ENCOUNTER — Ambulatory Visit: Payer: Medicare Other | Admitting: Emergency Medicine

## 2020-11-15 DIAGNOSIS — J449 Chronic obstructive pulmonary disease, unspecified: Secondary | ICD-10-CM

## 2020-11-15 DIAGNOSIS — Z87891 Personal history of nicotine dependence: Secondary | ICD-10-CM

## 2020-11-15 DIAGNOSIS — Z720 Tobacco use: Secondary | ICD-10-CM | POA: Diagnosis not present

## 2020-11-15 MED ORDER — BREZTRI AEROSPHERE 160-9-4.8 MCG/ACT IN AERO
2.0000 | INHALATION_SPRAY | Freq: Two times a day (BID) | RESPIRATORY_TRACT | 0 refills | Status: DC
Start: 2020-11-15 — End: 2020-12-21

## 2020-11-15 MED ORDER — ALBUTEROL SULFATE HFA 108 (90 BASE) MCG/ACT IN AERS: INHALATION_SPRAY | RESPIRATORY_TRACT | 0 refills | Status: DC

## 2020-11-15 NOTE — Assessment & Plan Note (Signed)
Former smoker.  Had a RADS 2 lung cancer screening scan in 09/2019.  She is due for her repeat and we will arrange for this.

## 2020-11-15 NOTE — Patient Instructions (Addendum)
Stop her Symbicort and Spiriva for now. We will try starting Breztri 2 puffs twice a day.  Rinse and gargle after using. Keep your albuterol available to use 2 puffs when needed for shortness of breath, chest tightness, wheezing.  You can try taking 2 puffs about 10 minutes before heavy exertion to see if this is helpful. You are due for your repeat lung cancer screening CT scan.  We will work on scheduling this. Follow with Dr Delton Coombes in 1 month or next available to discuss how you are doing on the new medication.

## 2020-11-15 NOTE — Assessment & Plan Note (Signed)
Progressive dyspnea.  She never got back to her usual baseline after viral process in January 2022, question COVID-19 (her husband was positive, she was negative).  She benefited from prednisone at the time.  We will try changing her current bronchodilator regimen to [facility], see if she gets more benefit.  Stop her Symbicort and Spiriva for now. We will try starting Breztri 2 puffs twice a day.  Rinse and gargle after using. Keep your albuterol available to use 2 puffs when needed for shortness of breath, chest tightness, wheezing.  You can try taking 2 puffs about 10 minutes before heavy exertion to see if this is helpful. Follow with Dr Delton Coombes in 1 month or next available to discuss how you are doing on the new medication.

## 2020-11-15 NOTE — Progress Notes (Signed)
Subjective:    Patient ID: [name], female    DOB: 08-31-1955, 65 y.o.   MRN: [id number]  HPI 65 year old former smoker (40 pack years) with a history of chronic back pain, anxiety, history of interstitial cystitis, hx transaminitis, ? Etiology (bx negative).  She carries a diagnosis of COPD that was made initially based on emphysematous changes seen on CT imaging from 2011, then a hospitalization for probable AE-COPD in 2015. She quit smoking in November 2020.   Currently managed on Symbicort, has been on this for over a year, albuterol as needed which she uses about 3x a day. She has daily exertional SOB - very little work required to get her SOB, sweeping, housework. She is able to shop at her pace. She was on O2 after her 2015 hospitalization - then she gave it back.   ROV 11/15/20 --follow-up visit for 65 year old woman, former smoker with a history of emphysematous COPD.  She is currently managed on Symbicort and Spiriva HandiHaler - isn't sure she reliably gets the Spiriva. She is active, able to do chores but has more dyspnea.  She uses albuterol, tries to minimize, averages about 1-2x a day. No cough. She can hear exertional wheeze. She has occasional sinus drainage.  She has been noticing progressive exertional SOB since January 2022 when she had a viral URI - she was COVID negative, but her husband was positive. She was treated with pred at the time, as well as once prior to that.   LDCT 09/20/19 was RADS2, due for repeat.    Review of Systems  Constitutional: Negative for fever and unexpected weight change.  HENT: Negative for congestion, dental problem, ear pain, nosebleeds, postnasal drip, rhinorrhea, sinus pressure, sneezing, sore throat and trouble swallowing.   Eyes: Negative for redness and itching.  Respiratory: Positive for shortness of breath and wheezing. Negative for cough and chest tightness.   Cardiovascular: Negative for palpitations and leg swelling.   Gastrointestinal: Negative for nausea and vomiting.  Genitourinary: Negative for dysuria.  Musculoskeletal: Negative for joint swelling.  Skin: Negative for rash.  Allergic/Immunologic: Negative.  Negative for environmental allergies, food allergies and immunocompromised state.  Neurological: Negative for headaches.  Hematological: Does not bruise/bleed easily.  Psychiatric/Behavioral: Negative for dysphoric mood. The patient is not nervous/anxious.    Past Medical History:  Diagnosis Date  . Anxiety   . Chronic back pain   . COPD (chronic obstructive pulmonary disease) (HCC)   . Tobacco abuse    Hx interstitial cystitis, surgical repair  History reviewed. No pertinent family history.   Social History   Socioeconomic History  . Marital status: Married    Spouse name: Not on file  . Number of children: Not on file  . Years of education: Not on file  . Highest education level: Not on file  Occupational History  . Not on file  Tobacco Use  . Smoking status: Former Smoker    Packs/day: 2.00    Years: 45.00    Pack years: 90.00    Types: Cigarettes    Quit date: 04/11/2019    Years since quitting: 1.6  . Smokeless tobacco: Never Used  Vaping Use  . Vaping Use: Never used  Substance and Sexual Activity  . Alcohol use: No  . Drug use: Not on file  . Sexual activity: Yes  Other Topics Concern  . Not on file  Social History Narrative  . Not on file   Social Determinants of Health   Financial  Resource Strain: Not on file  Food Insecurity: Not on file  Transportation Needs: Not on file  Physical Activity: Not on file  Stress: Not on file  Social Connections: Not on file  Intimate Partner Violence: Not on file     No Known Allergies   Outpatient Medications Prior to Visit  Medication Sig Dispense Refill  . ALPRAZolam (XANAX) 1 MG tablet Take 0.5-1 mg by mouth at bedtime as needed for sleep or anxiety.    . budesonide-formoterol (SYMBICORT) 160-4.5 MCG/ACT  inhaler Inhale 2 puffs into the lungs in the morning and at bedtime. 1 Inhaler 6  . docusate sodium (COLACE) 100 MG capsule Take 1 capsule (100 mg total) by mouth 2 (two) times daily. 10 capsule 0  . polyethylene glycol (MIRALAX / GLYCOLAX) 17 g packet Take 17 g by mouth daily. 14 each 0  . SPIRIVA HANDIHALER 18 MCG inhalation capsule INHALE 1 CAPSULE VIA HANDIHALER ONCE DAILY AT THE SAME TIME EVERY DAY 30 capsule 6  . VENTOLIN HFA 108 (90 Base) MCG/ACT inhaler INHALE 2 PUFFS INTO THE LUNGS EVERY 6 HOURS AS NEEDED FOR WHEEZE/SHORTNESS OF BREATH 36 each 0  . pantoprazole (PROTONIX) 40 MG tablet Take 1 tablet (40 mg total) by mouth daily. 30 tablet 1  . simethicone (MYLICON) 80 MG chewable tablet Chew 1 tablet (80 mg total) by mouth 3 (three) times daily. 30 tablet 0   No facility-administered medications prior to visit.        Objective:   Physical Exam  Vitals:   11/15/20 1449  BP: 118/60  Pulse: 89  Temp: 97.7 F (36.5 C)  TempSrc: Temporal  SpO2: 97%  Weight: 123 lb (55.8 kg)  Height: 5\' 1"  (1.549 m)   Gen: Pleasant, thin, in no distress,  normal affect  ENT: No lesions,  mouth clear,  oropharynx clear, no postnasal drip  Neck: No JVD, no stridor  Lungs: No use of accessory muscles, no crackles or wheezing on normal respiration, soft end exp wheeze on forced expiration  Cardiovascular: RRR, heart sounds normal, no murmur or gallops, no peripheral edema  Musculoskeletal: No deformities, no cyanosis. No clubbing  Neuro: alert, awake, non focal  Skin: Warm, no lesions or rash      Assessment & Plan:  COPD (chronic obstructive pulmonary disease) (HCC) Progressive dyspnea.  She never got back to her usual baseline after viral process in January 2022, question COVID-19 (her husband was positive, she was negative).  She benefited from prednisone at the time.  We will try changing her current bronchodilator regimen to [facility], see if she gets more benefit.  Stop her  Symbicort and Spiriva for now. We will try starting Breztri 2 puffs twice a day.  Rinse and gargle after using. Keep your albuterol available to use 2 puffs when needed for shortness of breath, chest tightness, wheezing.  You can try taking 2 puffs about 10 minutes before heavy exertion to see if this is helpful. Follow with [facility] in 1 month or next available to discuss how you are doing on the new medication.  Tobacco abuse Former smoker.  Had a RADS 2 lung cancer screening scan in 09/2019.  She is due for her repeat and we will arrange for this.  10/2019, MD, PhD 11/15/2020, 4:00 PM [facility] [phone] or if no answer [phone]

## 2020-11-22 ENCOUNTER — Other Ambulatory Visit: Payer: Self-pay

## 2020-11-22 ENCOUNTER — Ambulatory Visit (INDEPENDENT_AMBULATORY_CARE_PROVIDER_SITE_OTHER)
Admission: RE | Admit: 2020-11-22 | Discharge: 2020-11-22 | Disposition: A | Discharge status: Home / Self Care | Payer: Medicare Other | Source: Ambulatory Visit | Attending: Acute Care | Admitting: Acute Care

## 2020-11-22 DIAGNOSIS — Z87891 Personal history of nicotine dependence: Secondary | ICD-10-CM

## 2020-12-01 ENCOUNTER — Encounter: Payer: Self-pay | Admitting: *Deleted

## 2020-12-01 DIAGNOSIS — Z87891 Personal history of nicotine dependence: Secondary | ICD-10-CM

## 2020-12-01 NOTE — Progress Notes (Signed)
Please call patient and let them  know their  low dose Ct was read as a Lung RADS 2: nodules that are benign in appearance and behavior with a very low likelihood of becoming a clinically active cancer due to size or lack of growth. Recommendation per radiology is for a repeat LDCT in 12 months. .Please let them  know we will order and schedule their  annual screening scan for 11/2021. Please let them  know there was notation of CAD on their  scan.  Please remind the patient  that this is a non-gated exam therefore degree or severity of disease  cannot be determined. Please have them  follow up with their PCP regarding potential risk factor modification, dietary therapy or pharmacologic therapy if clinically indicated. Pt.  is not  currently on statin therapy. Please place order for annual  screening scan for  11/2021 and fax results to PCP. Thanks so much. 

## 2020-12-11 ENCOUNTER — Other Ambulatory Visit: Payer: Self-pay

## 2020-12-11 ENCOUNTER — Ambulatory Visit
Admission: RE | Admit: 2020-12-11 | Discharge: 2020-12-11 | Disposition: A | Discharge status: Home / Self Care | Payer: Medicare Other | Source: Ambulatory Visit | Attending: Family Medicine | Admitting: Family Medicine

## 2020-12-11 VITALS — BP 147/74 | HR 83 | Temp 97.9°F | Resp 17

## 2020-12-11 DIAGNOSIS — W57XXXA Bitten or stung by nonvenomous insect and other nonvenomous arthropods, initial encounter: Secondary | ICD-10-CM | POA: Diagnosis not present

## 2020-12-11 DIAGNOSIS — S70261A Insect bite (nonvenomous), right hip, initial encounter: Secondary | ICD-10-CM | POA: Diagnosis not present

## 2020-12-11 MED ORDER — DOXYCYCLINE HYCLATE 100 MG PO CAPS
100.0000 mg | ORAL_CAPSULE | Freq: Two times a day (BID) | ORAL | 0 refills | Status: DC
Start: 2020-12-11 — End: 2021-09-12

## 2020-12-11 MED ORDER — TRIAMCINOLONE ACETONIDE 0.025 % EX OINT: 1.0000 "application " | TOPICAL_OINTMENT | Freq: Two times a day (BID) | CUTANEOUS | 0 refills | Status: AC

## 2020-12-11 NOTE — ED Triage Notes (Signed)
Tick bite to RT hip since Friday, area is red

## 2020-12-11 NOTE — ED Provider Notes (Signed)
[facility]    CSN: [id number] Arrival date & time: 12/11/20  1739      History   Chief Complaint Chief Complaint  Patient presents with   Insect Bite    HPI [name] is a 65 y.o. female.   HPI Patient presents today for evaluation of a tick bite x3 days ago.  She reports initially she had a small in size rash which has gradually increased in diameter over the last few days.  She has noticed that the area has become more hardened and it appears to be spreading to the unaffected areas.  She denies any fever, generalized body aches or adenopathy. Past Medical History:  Diagnosis Date   Anxiety    Chronic back pain    COPD (chronic obstructive pulmonary disease) (HCC)    Tobacco abuse     Patient Active Problem List   Diagnosis Date Noted   Hypokalemia 05/01/2019   SBO (small bowel obstruction) (HCC) 04/28/2019   SIRS (systemic inflammatory response syndrome) (HCC) 04/28/2019   Tobacco abuse 04/28/2019   Chronic pain 04/28/2019   AKI (acute kidney injury) (HCC) 04/28/2019   Acute respiratory failure (HCC) 08/17/2013   COPD (chronic obstructive pulmonary disease) (HCC) 08/16/2013    Past Surgical History:  Procedure Laterality Date   ABDOMINAL HYSTERECTOMY     BACK SURGERY     ILEO LOOP NEOBLADDER     VESICOVAGINAL FISTULA REPAIR      OB History   No obstetric history on file.      Home Medications    Prior to Admission medications   Medication Sig Start Date End Date Taking? Authorizing Provider  albuterol (VENTOLIN HFA) 108 (90 Base) MCG/ACT inhaler INHALE 2 PUFFS INTO THE LUNGS EVERY 6 HOURS AS NEEDED FOR WHEEZE/SHORTNESS OF BREATH 11/15/20   Leslye Peer, MD  ALPRAZolam Prudy Feeler) 1 MG tablet Take 0.5-1 mg by mouth at bedtime as needed for sleep or anxiety. 03/12/19   [provider]  Budeson-Glycopyrrol-Formoterol (BREZTRI AEROSPHERE) 160-9-4.8 MCG/ACT AERO Inhale 2 puffs into the lungs 2 (two) times daily. 11/15/20   Leslye Peer,  MD  budesonide-formoterol (SYMBICORT) 160-4.5 MCG/ACT inhaler Inhale 2 puffs into the lungs in the morning and at bedtime. 11/01/19   Parrett, Virgel Bouquet, NP  docusate sodium (COLACE) 100 MG capsule Take 1 capsule (100 mg total) by mouth 2 (two) times daily. 05/02/19   Pollyann Savoy, DO  polyethylene glycol (MIRALAX / GLYCOLAX) 17 g packet Take 17 g by mouth daily. 05/02/19   Pollyann Savoy, DO  SPIRIVA HANDIHALER 18 MCG inhalation capsule INHALE 1 CAPSULE VIA HANDIHALER ONCE DAILY AT THE SAME TIME EVERY DAY 07/17/20   Parrett, Virgel Bouquet, NP    Family History No family history on file.  Social History Social History   Tobacco Use   Smoking status: Former    Packs/day: 2.00    Years: 45.00    Pack years: 90.00    Types: Cigarettes    Quit date: 04/11/2019    Years since quitting: 1.6   Smokeless tobacco: Never  Vaping Use   Vaping Use: Never used  Substance Use Topics   Alcohol use: No     Allergies   Patient has no known allergies.   Review of Systems Review of Systems Pertinent negatives listed in HPI  Physical Exam Triage Vital Signs ED Triage Vitals [12/11/20 1746]  Enc Vitals Group     BP (!) 147/74     Pulse Rate  83     Resp 17     Temp 97.9 F (36.6 C)     Temp Source Oral     SpO2 97 %     Weight      Height      Head Circumference      Peak Flow      Pain Score 3     Pain Loc      Pain Edu?      Excl. in GC?    No data found.  Updated Vital Signs BP (!) 147/74 (BP Location: Right Arm)   Pulse 83   Temp 97.9 F (36.6 C) (Oral)   Resp 17   SpO2 97%   Visual Acuity Right Eye Distance:   Left Eye Distance:   Bilateral Distance:    Right Eye Near:   Left Eye Near:    Bilateral Near:     Physical Exam  General appearance: alert, well developed, well nourished, cooperative  Head: Normocephalic, without obvious abnormality, atraumatic Respiratory: Respirations even and unlabored, normal respiratory rate Heart: Rate and rhythm normal. No gallop  or murmurs noted on exam  Extremities: Right hip with macular, indurated rash present  Skin: Skin color, texture, turgor normal. No rashes seen  Psych: Appropriate mood and affect. Neurologic: GCS 15, normal coordination normal gait UC Treatments / Results  Labs (all labs ordered are listed, but only abnormal results are displayed) Labs Reviewed - No data to display  EKG   Radiology No results found.  Procedures Procedures (including critical care time)  Medications Ordered in UC Medications - No data to display  Initial Impression / Assessment and Plan / UC Course  I have reviewed the triage vital signs and the nursing notes.  Pertinent labs & imaging results that were available during my care of the patient were reviewed by me and considered in my medical decision making (see chart for details).     Tick bite resulting in an infection.  Treatment with doxycycline twice daily for 10 days and triamcinolone cream for acute itching.  Return precautions given if symptoms worsen or do not improve. Final Clinical Impressions(s) / UC Diagnoses   Final diagnoses:  Tick bite of right hip, initial encounter  Bug bite with infection, initial encounter   Discharge Instructions   None    ED Prescriptions     Medication Sig Dispense Auth. Provider   doxycycline (VIBRAMYCIN) 100 MG capsule Take 1 capsule (100 mg total) by mouth 2 (two) times daily. 20 capsule Bing Neighbors, FNP   triamcinolone (KENALOG) 0.025 % ointment Apply 1 application topically 2 (two) times daily. Repeat treatment if needed. 90 g Bing Neighbors, FNP      PDMP not reviewed this encounter.   Bing Neighbors, FNP 12/11/20 [phone]

## 2020-12-13 ENCOUNTER — Other Ambulatory Visit: Payer: Self-pay | Admitting: Emergency Medicine

## 2020-12-19 ENCOUNTER — Encounter: Payer: Self-pay | Admitting: Emergency Medicine

## 2020-12-19 ENCOUNTER — Ambulatory Visit: Payer: Medicare Other | Admitting: Emergency Medicine

## 2020-12-19 ENCOUNTER — Other Ambulatory Visit: Payer: Self-pay

## 2020-12-19 DIAGNOSIS — J449 Chronic obstructive pulmonary disease, unspecified: Secondary | ICD-10-CM

## 2020-12-19 DIAGNOSIS — Z720 Tobacco use: Secondary | ICD-10-CM

## 2020-12-19 DIAGNOSIS — R0602 Shortness of breath: Secondary | ICD-10-CM | POA: Diagnosis not present

## 2020-12-19 NOTE — Assessment & Plan Note (Addendum)
Very severe COPD.  Suspect she has exertional hypoxemia.  We will screen for this today and start oxygen if present.  We will order Breztri, continue albuterol as needed.

## 2020-12-19 NOTE — Progress Notes (Signed)
Subjective:    Patient ID: [name], female    DOB: [birth year], 65 y.o.   MRN: [id number]  HPI 65 year old former smoker (40 pack years) with a history of chronic back pain, anxiety, history of interstitial cystitis, hx transaminitis, ? Etiology (bx negative).  She carries a diagnosis of COPD that was made initially based on emphysematous changes seen on CT imaging from 2011, then a hospitalization for probable AE-COPD in 2015. She quit smoking in November 2020.   Currently managed on Symbicort, has been on this for over a year, albuterol as needed which she uses about 3x a day. She has daily exertional SOB - very little work required to get her SOB, sweeping, housework. She is able to shop at her pace. She was on O2 after her 2015 hospitalization - then she gave it back.   ROV 11/15/20 --follow-up visit for 65 year old woman, former smoker with a history of emphysematous COPD.  She is currently managed on Symbicort and Spiriva HandiHaler - isn't sure she reliably gets the Spiriva. She is active, able to do chores but has more dyspnea.  She uses albuterol, tries to minimize, averages about 1-2x a day. No cough. She can hear exertional wheeze. She has occasional sinus drainage.  She has been noticing progressive exertional SOB since January 2022 when she had a viral URI - she was COVID negative, but her husband was positive. She was treated with pred at the time, as well as once prior to that.   LDCT 09/20/19 was RADS2, due for repeat.   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  I saw her 1 month ago when she was having progressive exertional dyspnea.  Question whether this was progression of her COPD.  Of note she also had a URI 06/2020, COVID-negative but her husband was positive and the clinical syndrome was consistent with COVID.  Question whether her progressive dyspnea may relate to aftereffects of COVID.  We tried changing her to [facility] to see if she would get more benefit.   Today she reports that she may have gotten some initial benefit, but she still has persistent exertional SOB, wheeze. She is using albuterol frequently - not always effective.   Lung cancer screening CT 11/22/2020 reviewed by me, shows stable nodular disease, 3 mm right middle lobe, RADS 2 study   Review of Systems  Constitutional:  Negative for unexpected weight change.  HENT:  Negative for sinus pressure.   Hematological:  Does not bruise/bleed easily.  Past Medical History:  Diagnosis Date   Anxiety    Chronic back pain    COPD (chronic obstructive pulmonary disease) (HCC)    Tobacco abuse    Hx interstitial cystitis, surgical repair  No family history on file.   Social History   Socioeconomic History   Marital status: Married    Spouse name: Not on file   Number of children: Not on file   Years of education: Not on file   Highest education level: Not on file  Occupational History   Not on file  Tobacco Use   Smoking status: Former    Packs/day: 2.00    Years: 45.00    Pack years: 90.00    Types: Cigarettes    Quit date: 04/11/2019    Years since quitting: 1.6   Smokeless tobacco: Never  Vaping Use   Vaping Use: Never used  Substance and Sexual Activity   Alcohol use: No   Drug use: Not on file  Sexual activity: Yes  Other Topics Concern   Not on file  Social History Narrative   Not on file   Social Determinants of Health   Financial Resource Strain: Not on file  Food Insecurity: Not on file  Transportation Needs: Not on file  Physical Activity: Not on file  Stress: Not on file  Social Connections: Not on file  Intimate Partner Violence: Not on file     No Known Allergies   Outpatient Medications Prior to Visit  Medication Sig Dispense Refill   albuterol (VENTOLIN HFA) 108 (90 Base) MCG/ACT inhaler INHALE 2 PUFFS INTO THE LUNGS EVERY 6 HOURS AS NEEDED FOR WHEEZE/SHORTNESS OF BREATH 36 each 11   ALPRAZolam (XANAX) 1 MG tablet Take 0.5-1 mg by mouth  at bedtime as needed for sleep or anxiety.     Budeson-Glycopyrrol-Formoterol (BREZTRI AEROSPHERE) 160-9-4.8 MCG/ACT AERO Inhale 2 puffs into the lungs 2 (two) times daily. 10.7 g 0   docusate sodium (COLACE) 100 MG capsule Take 1 capsule (100 mg total) by mouth 2 (two) times daily. 10 capsule 0   doxycycline (VIBRAMYCIN) 100 MG capsule Take 1 capsule (100 mg total) by mouth 2 (two) times daily. 20 capsule 0   polyethylene glycol (MIRALAX / GLYCOLAX) 17 g packet Take 17 g by mouth daily. 14 each 0   triamcinolone (KENALOG) 0.025 % ointment Apply 1 application topically 2 (two) times daily. Repeat treatment if needed. 90 g 0   SPIRIVA HANDIHALER 18 MCG inhalation capsule INHALE 1 CAPSULE VIA HANDIHALER ONCE DAILY AT THE SAME TIME EVERY DAY 30 capsule 6   budesonide-formoterol (SYMBICORT) 160-4.5 MCG/ACT inhaler Inhale 2 puffs into the lungs in the morning and at bedtime. 1 Inhaler 6   No facility-administered medications prior to visit.        Objective:   Physical Exam  Vitals:   12/19/20 1450  BP: 118/64  Pulse: 79  Temp: 98.1 F (36.7 C)  TempSrc: Oral  SpO2: 100%  Weight: 123 lb 12.8 oz (56.2 kg)  Height: 5\' 1"  (1.549 m)   Gen: Pleasant, thin, in no distress,  normal affect  ENT: No lesions,  mouth clear,  oropharynx clear, no postnasal drip  Neck: No JVD, no stridor  Lungs: No use of accessory muscles, very distant, no crackles or wheezing on normal respiration  Cardiovascular: RRR, heart sounds normal, no murmur or gallops, no peripheral edema  Musculoskeletal: No deformities, no cyanosis. No clubbing  Neuro: alert, awake, non focal  Skin: Warm, no lesions or rash      Assessment & Plan:  Tobacco abuse We reviewed her low-dose CT scan today.  Stable compared with prior to nodular disease.  She did have some evidence for cardiac coronary calcifications.  RADS 2 study, needs a repeat in 12 months.  COPD (chronic obstructive pulmonary disease) (HCC) Very severe  COPD.  Suspect she has exertional hypoxemia.  We will screen for this today and start oxygen if present.  We will order Breztri, continue albuterol as needed.   Shortness of breath Significant exertional dyspnea.  I suspect that this is due to her obstructive lung disease and possibly also superimposed hypoxemia, but she does have coronary calcifications on CT chest.  Certainly could have a cardiac contribution as she has a strong family history.  I believe she needs cardiac restratification and I will refer her to see cardiology in [place].  Time spent 30 minutes  Garrison, MD, PhD 12/19/2020, 3:20 PM [facility] [phone]  or if no answer [phone]

## 2020-12-19 NOTE — Assessment & Plan Note (Signed)
We reviewed her low-dose CT scan today.  Stable compared with prior to nodular disease.  She did have some evidence for cardiac coronary calcifications.  RADS 2 study, needs a repeat in 12 months.

## 2020-12-19 NOTE — Assessment & Plan Note (Signed)
Significant exertional dyspnea.  I suspect that this is due to her obstructive lung disease and possibly also superimposed hypoxemia, but she does have coronary calcifications on CT chest.  Certainly could have a cardiac contribution as she has a strong family history.  I believe she needs cardiac restratification and I will refer her to see cardiology in [place].

## 2020-12-19 NOTE — Patient Instructions (Signed)
We will plan to continue Breztri.  Use 2 puffs twice a day.  Rinse and gargle after using. Keep your albuterol available use 2 puffs when needed for shortness of breath, chest tightness, wheezing. We will perform walking oximetry today on room air. We reviewed your CT scan of the chest today.  This is stable.  We need to repeat in 1 year to continue your lung cancer screening. We will refer you to be seen by Cardiology in [place] to evaluate for possible cardiac contribution to your shortness of breath. Follow with [name] in 6 months or sooner if you have any problems

## 2020-12-19 NOTE — Addendum Note (Signed)
Addended by: Dorisann Frames R on: 12/19/2020 03:33 PM   Modules accepted: Orders

## 2020-12-21 ENCOUNTER — Telehealth: Payer: Self-pay | Admitting: Emergency Medicine

## 2020-12-21 MED ORDER — BREZTRI AEROSPHERE 160-9-4.8 MCG/ACT IN AERO: 2.0000 | INHALATION_SPRAY | Freq: Two times a day (BID) | RESPIRATORY_TRACT | 5 refills | Status: DC

## 2020-12-21 NOTE — Telephone Encounter (Signed)
Called and spoke with pt and she stated that she is feeling better.   She stated that she will start back with the breztri as this has been filled by her pharmacy.  She stated that she may have not received a full dose of this the last couple of days.  She stated that if she is not feeling better by Monday she will call back for an OV.  Nothing further is needed.

## 2020-12-21 NOTE — Telephone Encounter (Signed)
Agree with getting her back on breztri Agree with finishing the doxycycline She would probably benefit from tele-visit tomorrow or next week w APP if she can wait that long. If her sx are progressive then this is a change compared with our visit on 7/12 and she needs to be seen in the ED

## 2020-12-21 NOTE — Telephone Encounter (Signed)
Called and spoke with pt who states that she has been having a lot of problems with her breathing since her OV with RB 7/12. Pt states that she is out of the breztri as she ran out yesterday 7/13. Stated to pt that I was going to send Rx to the pharmacy for her.  While speaking with pt, pt said that she had to use her rescue inhaler about 15 times yesterday 7/13. Stated the only ambulation she did was walk around the house going to the bathroom and back to the bedroom or to other rooms of the house.  Pt also stated that she has been wheezing. Denies any complaints of cough.  Pt said that she did have some symbicort left over from prior which she did take today due to being out of the breztri and since she took the symbicort, she currently does not hear herself wheezing.  Pt is currently on doxycycline which was prescribed after having a tick bite and states that she will be finishing it up tomorrow 7/15.  Due to her symptoms, pt wants to know what we can recommend. Dr. Delton Coombes, please advise.

## 2020-12-28 ENCOUNTER — Other Ambulatory Visit: Payer: Self-pay

## 2020-12-28 ENCOUNTER — Ambulatory Visit: Payer: Medicare Other | Admitting: Emergency Medicine

## 2020-12-28 ENCOUNTER — Encounter: Payer: Self-pay | Admitting: Emergency Medicine

## 2020-12-28 DIAGNOSIS — J449 Chronic obstructive pulmonary disease, unspecified: Secondary | ICD-10-CM

## 2020-12-28 DIAGNOSIS — R061 Stridor: Secondary | ICD-10-CM

## 2020-12-28 DIAGNOSIS — R0602 Shortness of breath: Secondary | ICD-10-CM | POA: Diagnosis not present

## 2020-12-28 NOTE — Progress Notes (Signed)
Subjective:    Patient ID: [name], female    DOB: Dec 30, 1955, 65 y.o.   MRN: [id number]  HPI 65 year old former smoker (40 pack years) with a history of chronic back pain, anxiety, history of interstitial cystitis, hx transaminitis, ? Etiology (bx negative).  She carries a diagnosis of COPD that was made initially based on emphysematous changes seen on CT imaging from 2011, then a hospitalization for probable AE-COPD in 2015. She quit smoking in November 2020.   Currently managed on Symbicort, has been on this for over a year, albuterol as needed which she uses about 3x a day. She has daily exertional SOB - very little work required to get her SOB, sweeping, housework. She is able to shop at her pace. She was on O2 after her 2015 hospitalization - then she gave it back.   ROV 11/15/20 --follow-up visit for 65 year old woman, former smoker with a history of emphysematous COPD.  She is currently managed on Symbicort and Spiriva HandiHaler - isn't sure she reliably gets the Spiriva. She is active, able to do chores but has more dyspnea.  She uses albuterol, tries to minimize, averages about 1-2x a day. No cough. She can hear exertional wheeze. She has occasional sinus drainage.  She has been noticing progressive exertional SOB since January 2022 when she had a viral URI - she was COVID negative, but her husband was positive. She was treated with pred at the time, as well as once prior to that.   LDCT 09/20/19 was RADS2, due for repeat.   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  I saw her 1 month ago when she was having progressive exertional dyspnea.  Question whether this was progression of her COPD.  Of note she also had a URI 06/2020, COVID-negative but her husband was positive and the clinical syndrome was consistent with COVID.  Question whether her progressive dyspnea may relate to aftereffects of COVID.  We tried changing her to [facility] to see if she would get more benefit.   Today she reports that she may have gotten some initial benefit, but she still has persistent exertional SOB, wheeze. She is using albuterol frequently - not always effective.   Lung cancer screening CT 11/22/2020 reviewed by me, shows stable nodular disease, 3 mm right middle lobe, RADS 2 study  ROV 12/28/20 --follow-up visit 65 year old woman with emphysema and very severe obstruction by pulmonary function testing.  We changed her to [place] in June. She is continue to have exertional dyspnea and had coronary calcifications on her CT chest so I referred her back to cardiology in [place] - she hasn't been called about this yet.  She reports today that she continues to have exertional SOB. She is back on [place], is unsure whether it is helping her exertional SOB. She believes she is getting the med adequately.  She has some intermittent GERD, more lately. Also a lot of nasal congestion and drainage.  She recorded her breathing pattern for me while exerting. Clear stridor on insp and exp.     Review of Systems  Constitutional:  Negative for unexpected weight change.  HENT:  Negative for sinus pressure.   Hematological:  Does not bruise/bleed easily.       Objective:   Physical Exam  Vitals:   12/28/20 1455  BP: 124/70  Pulse: 83  Temp: 97.7 F (36.5 C)  TempSrc: Oral  SpO2: 100%  Weight: 126 lb 9.6 oz (57.4 kg)  Height: 5\' 1"  (1.549  m)   Gen: Pleasant, thin, in no distress,  normal affect  ENT: No lesions,  mouth clear,  oropharynx clear, no postnasal drip  Neck: No JVD, no stridor  Lungs: No use of accessory muscles, very distant, no wheeze at rest.   Cardiovascular: RRR, heart sounds normal, no murmur or gallops, no peripheral edema  Musculoskeletal: No deformities, no cyanosis. No clubbing  Neuro: alert, awake, non focal  Skin: Warm, no lesions or rash      Assessment & Plan:  Stridor Stridor and upper airway irritation appear to be at least in part responsible  for her exertional dyspnea.  She had upper airway noise when walking through the hallway of our office, has also recorded the phenomenon for me to review.  Both inspiratory and expiratory.  Certainly associated with overall increased work of breathing and tachypnea.  I think anything we can do to decrease her upper airway irritation would be beneficial.  We will try to more aggressively treat GERD, rhinitis  COPD (chronic obstructive pulmonary disease) (HCC) She was temporarily off the [place], now back on it.  I think she is benefiting although she has breakthrough dyspnea, superimposed upper airway noise that do confuse the issue.  Her dyspnea is multifactorial.  Try to explain to her today that she needs to stay on maintenance BD therapy.  She has albuterol to use if needed.  Shortness of breath Again multifactorial.  Clearly her obstructive lung disease is very severe but she also appears to have upper airway wheezing/stridor.  Does not respond to albuterol (which she is overusing).  I will try to keep her on Breztri, treat upper airway irritants as above.  She still needs to follow-up with cardiology for restratification given her severe exertional shortness of breath    Levy Pupa, MD, PhD 12/28/2020, 5:20 PM [facility] [phone] or if no answer [phone]

## 2020-12-28 NOTE — Assessment & Plan Note (Signed)
She was temporarily off the [place], now back on it.  I think she is benefiting although she has breakthrough dyspnea, superimposed upper airway noise that do confuse the issue.  Her dyspnea is multifactorial.  Try to explain to her today that she needs to stay on maintenance BD therapy.  She has albuterol to use if needed.

## 2020-12-28 NOTE — Assessment & Plan Note (Signed)
Stridor and upper airway irritation appear to be at least in part responsible for her exertional dyspnea.  She had upper airway noise when walking through the hallway of our office, has also recorded the phenomenon for me to review.  Both inspiratory and expiratory.  Certainly associated with overall increased work of breathing and tachypnea.  I think anything we can do to decrease her upper airway irritation would be beneficial.  We will try to more aggressively treat GERD, rhinitis

## 2020-12-28 NOTE — Assessment & Plan Note (Signed)
Again multifactorial.  Clearly her obstructive lung disease is very severe but she also appears to have upper airway wheezing/stridor.  Does not respond to albuterol (which she is overusing).  I will try to keep her on Breztri, treat upper airway irritants as above.  She still needs to follow-up with cardiology for restratification given her severe exertional shortness of breath

## 2020-12-28 NOTE — Patient Instructions (Addendum)
Please continue Breztri 2 puffs twice a day.  Rinse and gargle after using.   Keep your albuterol available to use 2 puffs up to every 4 hours if needed for shortness of breath, chest tightness, wheezing. Please start omeprazole 20 mg twice a day.  Take this medication 1 hour around food Please start loratadine 10 mg once daily. Please start fluticasone nasal spray, 2 sprays each nostril once daily. We will work on confirming your cardiology follow-up appointment Follow with APP in 4 to 6 weeks to assess status on the new medications Follow with Dr Delton Coombes in 6 months or sooner if you have any problems

## 2021-01-19 ENCOUNTER — Other Ambulatory Visit (HOSPITAL_COMMUNITY)
Admission: RE | Admit: 2021-01-19 | Discharge: 2021-01-19 | Disposition: A | Discharge status: Home / Self Care | Payer: Medicare Other | Source: Ambulatory Visit | Attending: Internal Medicine | Admitting: Internal Medicine

## 2021-01-19 ENCOUNTER — Ambulatory Visit: Payer: Medicare Other | Admitting: Internal Medicine

## 2021-01-19 ENCOUNTER — Other Ambulatory Visit: Payer: Self-pay

## 2021-01-19 ENCOUNTER — Encounter: Payer: Self-pay | Admitting: Internal Medicine

## 2021-01-19 VITALS — BP 128/62 | HR 97 | Ht 61.0 in | Wt 121.6 lb

## 2021-01-19 DIAGNOSIS — R0602 Shortness of breath: Secondary | ICD-10-CM | POA: Diagnosis not present

## 2021-01-19 DIAGNOSIS — Z13220 Encounter for screening for lipoid disorders: Secondary | ICD-10-CM

## 2021-01-19 LAB — LIPID PANEL
Cholesterol: 157 mg/dL (ref 0–200)
HDL: 77 mg/dL (ref >40)
LDL Cholesterol: 71 mg/dL (ref 0–99)
Total CHOL/HDL Ratio: 2 RATIO
Triglycerides: 47 mg/dL (ref <150)
VLDL: 9 mg/dL (ref 0–40)

## 2021-01-19 NOTE — Progress Notes (Signed)
Cardiology Office Note   Date:  01/20/2021   ID:  [name], DOB 1956-04-29, MRN [id number]  PCP:  Marva Panda, NP  Cardiologist:   Dietrich Pates, MD   Patient referred for evaluation of SOB      History of Present Illness: [name] is a 65 y.o. female with a history of severe COPD, tobacco abuse   She is followed in pulmonary   Referred for SOB CT of chest in June showed atherosclerosis of the aorta   There was severe centrilobular emphysema noted and a stable nodule     The pt says she does have some SOB   SHe also says that she will have occcasional chest pressure  that last 5 to 20 min most   Mild  Not associated with activity   NO change in frequency  Has had for awhile She does get SOB with activity   Has been chronic   No change She has occasional heart racing   WIll be a little dizzy with this when occurs   None recently   No syncope       Echo in 2015 LVEF normal   RVEF normal    Current Meds  Medication Sig   albuterol (VENTOLIN HFA) 108 (90 Base) MCG/ACT inhaler INHALE 2 PUFFS INTO THE LUNGS EVERY 6 HOURS AS NEEDED FOR WHEEZE/SHORTNESS OF BREATH   ALPRAZolam (XANAX) 1 MG tablet Take 0.5-1 mg by mouth at bedtime as needed for sleep or anxiety.   Budeson-Glycopyrrol-Formoterol (BREZTRI AEROSPHERE) 160-9-4.8 MCG/ACT AERO Inhale 2 puffs into the lungs 2 (two) times daily.   docusate sodium (COLACE) 100 MG capsule Take 1 capsule (100 mg total) by mouth 2 (two) times daily.   fluticasone (FLONASE) 50 MCG/ACT nasal spray Place into both nostrils daily.   loratadine (CLARITIN) 10 MG tablet Take 10 mg by mouth daily.   omeprazole (PRILOSEC) 20 MG capsule Take 20 mg by mouth 2 (two) times daily before a meal.   polyethylene glycol (MIRALAX / GLYCOLAX) 17 g packet Take 17 g by mouth daily.   triamcinolone (KENALOG) 0.025 % ointment Apply 1 application topically 2 (two) times daily. Repeat treatment if needed.     Allergies:   Patient has no known allergies.    Past Medical History:  Diagnosis Date   Anxiety    Chronic back pain    COPD (chronic obstructive pulmonary disease) (HCC)    Tobacco abuse     Past Surgical History:  Procedure Laterality Date   ABDOMINAL HYSTERECTOMY     BACK SURGERY     ILEO LOOP NEOBLADDER     VESICOVAGINAL FISTULA REPAIR       Social History:  The patient  reports that she quit smoking about 21 months ago. Her smoking use included cigarettes. She has a 90.00 pack-year smoking history. She has never used smokeless tobacco. She reports that she does not drink alcohol.   Family History:  The patient's family history is not on file.    ROS:  Please see the history of present illness. All other systems are reviewed and  Negative to the above problem except as noted.    PHYSICAL EXAM: VS:  BP 128/62   Pulse 97   Ht 5\' 1"  (1.549 m)   Wt 121 lb 9.6 oz (55.2 kg)   SpO2 95%   BMI 22.98 kg/m   GEN: Thin 65 yo in no acute distress  HEENT: normal  Neck: no JVD, carotid bruits Cardiac:  RRR; no murmurs,  No LE edema  Respiratory:  Decreased airflow   MIld wheeze   GI: soft, nontender, nondistended, + BS  No hepatomegaly  MS: no deformity Moving all extremities   Skin: warm and dry, no rash Neuro:  Strength and sensation are intact Psych: euthymic mood, full affect   EKG:  EKG is ordered today.  SR   98 bpm  Occasional PVC  NOnspecifie ST changes with sl sagging of ST seg inferiorl    Lipid Panel    Component Value Date/Time   CHOL 157 01/19/2021 1412   TRIG 47 01/19/2021 1412   HDL 77 01/19/2021 1412   CHOLHDL 2.0 01/19/2021 1412   VLDL 9 01/19/2021 1412   LDLCALC 71 01/19/2021 1412      Wt Readings from Last 3 Encounters:  01/19/21 121 lb 9.6 oz (55.2 kg)  12/28/20 126 lb 9.6 oz (57.4 kg)  12/19/20 123 lb 12.8 oz (56.2 kg)      ASSESSMENT AND PLAN:  1  Dyspnea.   The pt has significant emphysema    Her hx does not give for an acceleration/amplification in symptoms   I am not convinced  that this represents an anginal equivalent Volume status also is OK      She has evidence of atherosclerosis on CT (scattered calcifications noted in abdominal aorta on CT in 2020) and therefore probable CAD   Again, I am not convinced flow limiting     I would manage risks and follow clinically  Will check lipids   Keep track of BP  2    Pulmonary  Follow with R Byrum    Will set for follow up with me in the winter   Current medicines are reviewed at length with the patient today.  The patient does not have concerns regarding medicines.  Signed, Dietrich Pates, MD  01/20/2021 10:44 AM    [facility] [address] [place], [place], [place]  [address] Phone: [phone]; Fax: [phone]

## 2021-01-19 NOTE — Patient Instructions (Signed)
Medication Instructions:  Your physician recommends that you continue on your current medications as directed. Please refer to the Current Medication list given to you today.  *If you need a refill on your cardiac medications before your next appointment, please call your pharmacy*   Lab Work: Fasting Lipids   If you have labs (blood work) drawn today and your tests are completely normal, you will receive your results only by: MyChart Message (if you have MyChart) OR A paper copy in the mail If you have any lab test that is abnormal or we need to change your treatment, we will call you to review the results.   Testing/Procedures: None today    Follow-Up: At [facility], you and your health needs are our priority.  As part of our continuing mission to provide you with exceptional heart care, we have created designated Provider Care Teams.  These Care Teams include your primary Cardiologist (physician) and Advanced Practice Providers (APPs -  Physician Assistants and Nurse Practitioners) who all work together to provide you with the care you need, when you need it.  We recommend signing up for the patient portal called "MyChart".  Sign up information is provided on this After Visit Summary.  MyChart is used to connect with patients for Virtual Visits (Telemedicine).  Patients are able to view lab/test results, encounter notes, upcoming appointments, etc.  Non-urgent messages can be sent to your provider as well.   To learn more about what you can do with MyChart, go to [email or web address].    Your next appointment:  To be determined after lab work

## 2021-01-20 ENCOUNTER — Telehealth: Payer: Self-pay | Admitting: Internal Medicine

## 2021-01-20 NOTE — Telephone Encounter (Signed)
Please set for f/u with me in the winter   ( Jan or so)

## 2021-01-22 ENCOUNTER — Telehealth: Payer: Self-pay

## 2021-01-22 DIAGNOSIS — Z13220 Encounter for screening for lipoid disorders: Secondary | ICD-10-CM

## 2021-01-22 MED ORDER — ROSUVASTATIN CALCIUM 5 MG PO TABS
5.0000 mg | ORAL_TABLET | Freq: Every day | ORAL | 3 refills | Status: DC
Start: 2021-01-22 — End: 2021-12-12

## 2021-01-22 NOTE — Telephone Encounter (Signed)
Pt verbalized understanding. Medication went to [facility] on [address].

## 2021-01-22 NOTE — Telephone Encounter (Signed)
-----   Message from Dietrich Pates V, MD sent at 01/19/2021  9:40 PM EDT ----- Lipids are very good  witl LDL 71  But with CAD on CT scan would add low dose Crestor to stabilize   5 mg   daily  CHeck lipids and liver panel in 8 wks

## 2021-01-25 ENCOUNTER — Ambulatory Visit: Payer: Medicare Other | Admitting: Adult Health

## 2021-01-26 NOTE — Addendum Note (Signed)
Addended by: Marlyn Corporal A on: 01/26/2021 04:41 PM   Modules accepted: Orders

## 2021-02-06 ENCOUNTER — Other Ambulatory Visit: Payer: Self-pay

## 2021-02-06 ENCOUNTER — Encounter: Payer: Self-pay | Admitting: Adult Health

## 2021-02-06 ENCOUNTER — Ambulatory Visit: Payer: Medicare Other | Admitting: Adult Health

## 2021-02-06 VITALS — BP 134/80 | HR 86 | Temp 97.8°F | Ht 61.0 in | Wt 121.6 lb

## 2021-02-06 DIAGNOSIS — R5381 Other malaise: Secondary | ICD-10-CM

## 2021-02-06 DIAGNOSIS — J449 Chronic obstructive pulmonary disease, unspecified: Secondary | ICD-10-CM

## 2021-02-06 MED ORDER — ARFORMOTEROL TARTRATE 15 MCG/2ML IN NEBU
15.0000 ug | INHALATION_SOLUTION | Freq: Two times a day (BID) | RESPIRATORY_TRACT | 6 refills | Status: DC
Start: 2021-02-06 — End: 2021-12-12

## 2021-02-06 MED ORDER — BREZTRI AEROSPHERE 160-9-4.8 MCG/ACT IN AERO
2.0000 | INHALATION_SPRAY | Freq: Two times a day (BID) | RESPIRATORY_TRACT | 0 refills | Status: DC
Start: 2021-02-06 — End: 2022-01-03

## 2021-02-06 MED ORDER — YUPELRI 175 MCG/3ML IN SOLN: 175.0000 ug | Freq: Every day | RESPIRATORY_TRACT | 3 refills | Status: DC

## 2021-02-06 MED ORDER — BUDESONIDE 0.5 MG/2ML IN SUSP: 0.2500 mg | Freq: Two times a day (BID) | RESPIRATORY_TRACT | Status: DC

## 2021-02-06 NOTE — Patient Instructions (Addendum)
Finish BREZTRI and stop when inhaler is empty.  Begin Brovana and Budesonide Neb Twice daily  -send to DME  Begin [employer] daily -send to DME  New neb machine to DME .  Flu shot this Fall  Activity as tolerated.  Refer to Pulmonary Rehab .  Follow up with with Dr. Delton Coombes  in 3-4 months and As needed

## 2021-02-06 NOTE — Assessment & Plan Note (Signed)
Improved symptom control on current regimen  Trigger prevention  Will change to Neb meds to help with cost.  Refer to pulmonary rehab   Plan  Patient Instructions  Hilliard Clark and stop when inhaler is empty.  Begin Brovana and Budesonide Neb Twice daily  -send to DME  Begin [employer] daily -send to DME  New neb machine to DME .  Flu shot this Fall  Activity as tolerated.  Refer to Pulmonary Rehab .  Follow up with with Dr. Delton Coombes  in 3-4 months and As needed

## 2021-02-06 NOTE — Progress Notes (Signed)
@Patient  ID: , female    DOB: [birth year], 65 y.o.   MRN: [id number]  Chief Complaint  Patient presents with   Follow-up    Referring provider: [id number], NP  HPI: 65 year old female former smoker (90-pack-year) quit in November 2020 seen for pulmonary consult August 16, 2019 for COPD.  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  Emphysema noted.  10/2019 pulmonary function testing today shows severe COPD with an FEV1 at 38%, ratio 51, FVC 57%, positive bronchodilator response, severe mid flow obstruction with significant reversibility, DLCO 63% Walk test no desats   02/06/2021 Follow up : COPD w/ emphysema  Patient returns for a 1 month follow-up.  Patient has underlying severe COPD with emphysema.  Over the last year she has had progressive shortness of breath and decline in activity tolerance.  She was changed to [facility] .  She was also referred to cardiology.  She was seen by cardiology earlier this month with recommendations for medical management. Since last visit patient says her breathing is doing better. Feels wheezing is improved.  Says she can not afford BREZTRI , she is in the doughnut hole. Inhalers are too expensive. We discused changing to neb meds and she would like to try . Had some upper airway wheezing , says this is better since starting prilosec and claritin d .   Had panic attack last week while in elevator. Was able to work through it , took albuterol inhaler. We discussed helpful techniques to handle dyspnea .         No Known Allergies  Immunization History  Administered Date(s) Administered    Influenza,inj,Quad PF,6+ Mos 05/02/2019   PFIZER(Purple Top)SARS-COV-2 Vaccination 09/01/2019, 09/22/2019, 02/25/2020   Pneumococcal Polysaccharide-23 08/17/2013, 05/02/2019   Unspecified SARS-COV-2 Vaccination 10/09/2019, 10/29/2019    Past Medical History:  Diagnosis Date   Anxiety    Chronic back pain    COPD (chronic obstructive pulmonary disease) (HCC)    Tobacco abuse     Tobacco History: Social History   Tobacco Use  Smoking Status Former   Packs/day: 2.00   Years: 45.00   Pack years: 90.00   Types: Cigarettes   Quit date: 04/11/2019   Years since quitting: 1.8  Smokeless Tobacco Never   Counseling given: Not Answered   Outpatient Medications Prior to Visit  Medication Sig Dispense Refill   albuterol (VENTOLIN HFA) 108 (90 Base) MCG/ACT inhaler INHALE 2 PUFFS INTO THE LUNGS EVERY 6 HOURS AS NEEDED FOR WHEEZE/SHORTNESS OF BREATH 36 each 11   ALPRAZolam (XANAX) 1 MG tablet Take 0.5-1 mg by mouth at bedtime as needed for sleep or anxiety.     Budeson-Glycopyrrol-Formoterol (BREZTRI AEROSPHERE) 160-9-4.8 MCG/ACT AERO Inhale 2 puffs into the lungs 2 (two) times daily. 10.7 g 5   docusate sodium (COLACE) 100 MG capsule Take 1 capsule (100 mg total) by mouth 2 (two) times daily. 10 capsule 0   fluticasone (FLONASE) 50 MCG/ACT nasal spray Place into both nostrils daily.     loratadine (CLARITIN) 10 MG tablet Take 10 mg by mouth daily.     omeprazole (PRILOSEC) 20 MG capsule Take 20 mg by mouth 2 (two) times daily before a  meal.     polyethylene glycol (MIRALAX / GLYCOLAX) 17 g packet Take 17 g by mouth daily. 14 each 0   rosuvastatin (CRESTOR) 5 MG tablet Take 1 tablet (5 mg total) by mouth daily. 90 tablet 3   doxycycline (VIBRAMYCIN) 100 MG capsule Take 1 capsule (100 mg total) by mouth 2 (two) times daily. 20 capsule 0   SPIRIVA HANDIHALER 18 MCG inhalation capsule INHALE 1 CAPSULE VIA HANDIHALER ONCE DAILY AT THE SAME TIME EVERY DAY 30 capsule 6   No  facility-administered medications prior to visit.     Review of Systems:   Constitutional:   No  weight loss, night sweats,  Fevers, chills,  +fatigue, or  lassitude.  HEENT:   No headaches,  Difficulty swallowing,  Tooth/dental problems, or  Sore throat,                No sneezing, itching, ear ache, nasal congestion, post nasal drip,   CV:  No chest pain,  Orthopnea, PND, swelling in lower extremities, anasarca, dizziness, palpitations, syncope.   GI  No heartburn, indigestion, abdominal pain, nausea, vomiting, diarrhea, change in bowel habits, loss of appetite, bloody stools.   Resp: .  No chest wall deformity  Skin: no rash or lesions.  GU: no dysuria, change in color of urine, no urgency or frequency.  No flank pain, no hematuria   MS:  No joint pain or swelling.  No decreased range of motion.  No back pain.    Physical Exam  BP 134/80 (BP Location: Left Arm, Patient Position: Sitting, Cuff Size: Normal)   Pulse 86   Temp 97.8 F (36.6 C) (Oral)   Ht 5\' 1"  (1.549 m)   Wt 121 lb 9.6 oz (55.2 kg)   SpO2 97%   BMI 22.98 kg/m   GEN: A/Ox3; pleasant , NAD, well nourished    HEENT:  [place]/AT,  NOSE-clear, THROAT-clear, no lesions, no postnasal drip or exudate noted.   NECK:  Supple w/ fair ROM; no JVD; normal carotid impulses w/o bruits; no thyromegaly or nodules palpated; no lymphadenopathy.    RESP  Clear  P & A; w/o, wheezes/ rales/ or rhonchi. no accessory muscle use, no dullness to percussion  CARD:  RRR, no m/r/g, no peripheral edema, pulses intact, no cyanosis or clubbing.  GI:   Soft & nt; nml bowel sounds; no organomegaly or masses detected.   Musco: Warm bil, no deformities or joint swelling noted.   Neuro: alert, no focal deficits noted.    Skin: Warm, no lesions or rashes    Lab Results:  CBC     BNP No results found for: BNP  ProBNP No results found for: PROBNP  Imaging: No results found.    PFT Results Latest Ref Rng & Units  11/01/2019  FVC-Pre L 1.55  FVC-Predicted Pre % 51  FVC-Post L 1.75  FVC-Predicted Post % 57  Pre FEV1/FVC % % 48  Post FEV1/FCV % % 51  FEV1-Pre L 0.75  FEV1-Predicted Pre % 32  FEV1-Post L 0.89  DLCO uncorrected ml/min/mmHg 11.99  DLCO UNC% % 63  DLCO corrected ml/min/mmHg 11.99  DLCO COR %Predicted % 63  DLVA Predicted % 78  TLC L 6.23  TLC % Predicted % 131  RV % Predicted % 237    No results found for: NITRICOXIDE      Assessment & Plan:   No problem-specific Assessment & Plan notes found for this encounter.     11/03/2019, NP  02/06/2021   

## 2021-02-06 NOTE — Assessment & Plan Note (Signed)
Has decreased activity tolerance and low endurance with severe COPD  Refer to pulmonary rehab   Plan  Patient Instructions  Hilliard Clark and stop when inhaler is empty.  Begin Brovana and Budesonide Neb Twice daily  -send to DME  Begin [employer] daily -send to DME  New neb machine to DME .  Flu shot this Fall  Activity as tolerated.  Refer to Pulmonary Rehab .  Follow up with with Dr. Delton Coombes  in 3-4 months and As needed

## 2021-07-25 ENCOUNTER — Telehealth: Payer: Self-pay | Admitting: Adult Health

## 2021-07-25 DIAGNOSIS — J449 Chronic obstructive pulmonary disease, unspecified: Secondary | ICD-10-CM

## 2021-07-25 NOTE — Telephone Encounter (Signed)
Please advise if ok to send in order for pulmonary rehab in ?

## 2021-07-25 NOTE — Telephone Encounter (Signed)
Yes please change the referral location. Thanks.

## 2021-07-27 NOTE — Telephone Encounter (Signed)
Called patient but she did not answer. Left message for her to call back.  

## 2021-07-27 NOTE — Telephone Encounter (Signed)
Called and spoke with patient. She is aware that RB has approved the order for pulm rehab in [place].   Order has been placed.   Nothing further needed at time of call.

## 2021-08-17 DIAGNOSIS — F419 Anxiety disorder, unspecified: Secondary | ICD-10-CM | POA: Diagnosis not present

## 2021-08-17 DIAGNOSIS — R234 Changes in skin texture: Secondary | ICD-10-CM | POA: Diagnosis not present

## 2021-08-17 DIAGNOSIS — J309 Allergic rhinitis, unspecified: Secondary | ICD-10-CM | POA: Diagnosis not present

## 2021-08-17 DIAGNOSIS — J449 Chronic obstructive pulmonary disease, unspecified: Secondary | ICD-10-CM | POA: Diagnosis not present

## 2021-08-17 DIAGNOSIS — Z79899 Other long term (current) drug therapy: Secondary | ICD-10-CM | POA: Diagnosis not present

## 2021-08-27 DIAGNOSIS — Z0000 Encounter for general adult medical examination without abnormal findings: Secondary | ICD-10-CM | POA: Diagnosis not present

## 2021-09-03 ENCOUNTER — Other Ambulatory Visit: Payer: Self-pay | Admitting: *Deleted

## 2021-09-03 DIAGNOSIS — R1031 Right lower quadrant pain: Secondary | ICD-10-CM

## 2021-09-13 ENCOUNTER — Encounter (HOSPITAL_COMMUNITY)
Admission: RE | Admit: 2021-09-13 | Discharge: 2021-09-13 | Disposition: A | Discharge status: Home / Self Care | Payer: Medicare Other | Source: Ambulatory Visit | Attending: Emergency Medicine | Admitting: Emergency Medicine

## 2021-09-13 ENCOUNTER — Encounter (HOSPITAL_COMMUNITY): Payer: Self-pay

## 2021-09-13 VITALS — BP 104/60 | HR 77 | Ht 60.0 in | Wt 122.1 lb

## 2021-09-13 DIAGNOSIS — J449 Chronic obstructive pulmonary disease, unspecified: Secondary | ICD-10-CM | POA: Insufficient documentation

## 2021-09-13 NOTE — Progress Notes (Signed)
Pulmonary Individual Treatment Plan ? ?Patient Details  ?Name: [name] ?MRN: [id number] ?Date of Birth: 07/05/1955 ?Referring Provider:   ?Flowsheet Row PULMONARY REHAB COPD ORIENTATION from 09/13/2021 in [place] [facility]  ?Referring Provider Dr. Delton Coombes  ? ?  ? ? ?Initial Encounter Date:  ?Flowsheet Row PULMONARY REHAB COPD ORIENTATION from 09/13/2021 in [place] [facility]  ?Date 09/13/21  ? ?  ? ? ?Visit Diagnosis: Chronic obstructive pulmonary disease, unspecified COPD type (HCC) ? ?Patient's Home Medications on Admission:  ? ?Current Outpatient Medications:  ?  albuterol (VENTOLIN HFA) 108 (90 Base) MCG/ACT inhaler, INHALE 2 PUFFS INTO THE LUNGS EVERY 6 HOURS AS NEEDED FOR WHEEZE/SHORTNESS OF BREATH, Disp: 36 each, Rfl: 11 ?  ALPRAZolam (XANAX) 1 MG tablet, Take 0.5-1 mg by mouth at bedtime as needed for sleep or anxiety., Disp: , Rfl:  ?  Budeson-Glycopyrrol-Formoterol (BREZTRI AEROSPHERE) 160-9-4.8 MCG/ACT AERO, Inhale 2 puffs into the lungs 2 (two) times daily. (Patient taking differently: Inhale 2 puffs into the lungs in the morning.), Disp: 10.7 g, Rfl: 5 ?  docusate sodium (COLACE) 100 MG capsule, Take 1 capsule (100 mg total) by mouth 2 (two) times daily. (Patient taking differently: Take 100 mg by mouth 2 (two) times daily as needed (constipation).), Disp: 10 capsule, Rfl: 0 ?  fluticasone (FLONASE) 50 MCG/ACT nasal spray, Place 1 spray into both nostrils daily as needed for allergies., Disp: , Rfl:  ?  ibuprofen (ADVIL) 200 MG tablet, Take 400 mg by mouth every 8 (eight) hours as needed (pain.)., Disp: , Rfl:  ?  loratadine (CLARITIN) 10 MG tablet, Take 10 mg by mouth daily as needed for allergies., Disp: , Rfl:  ?  Magnesium 250 MG TABS, Take 500 mg by mouth in the morning., Disp: , Rfl:  ?  montelukast (SINGULAIR) 10 MG tablet, Take 10 mg by mouth in the morning., Disp: , Rfl:  ?  omeprazole (PRILOSEC) 20 MG capsule, Take 20 mg by mouth 2 (two) times daily as needed  (indigestion/heartburn)., Disp: , Rfl:  ?  rosuvastatin (CRESTOR) 5 MG tablet, Take 1 tablet (5 mg total) by mouth daily., Disp: 90 tablet, Rfl: 3 ?  arformoterol (BROVANA) 15 MCG/2ML NEBU, Take 2 mLs (15 mcg total) by nebulization 2 (two) times daily. (Patient not taking: Reported on 09/12/2021), Disp: 120 mL, Rfl: 6 ?  Budeson-Glycopyrrol-Formoterol (BREZTRI AEROSPHERE) 160-9-4.8 MCG/ACT AERO, Inhale 2 puffs into the lungs in the morning and at bedtime. (Patient not taking: Reported on 09/12/2021), Disp: 5.9 g, Rfl: 0 ?  revefenacin (YUPELRI) 175 MCG/3ML nebulizer solution, Take 3 mLs (175 mcg total) by nebulization daily. (Patient not taking: Reported on 09/12/2021), Disp: 90 mL, Rfl: 3 ?  SPIRIVA HANDIHALER 18 MCG inhalation capsule, INHALE 1 CAPSULE VIA HANDIHALER ONCE DAILY AT THE SAME TIME EVERY DAY (Patient not taking: Reported on 09/12/2021), Disp: 30 capsule, Rfl: 6 ? ?Current Facility-Administered Medications:  ?  budesonide (PULMICORT) nebulizer solution 0.25 mg, 0.25 mg, Nebulization, BID, Byrum, Les Pou, MD ? ?Past Medical History: ?Past Medical History:  ?Diagnosis Date  ? Anxiety   ? Chronic back pain   ? COPD (chronic obstructive pulmonary disease) (HCC)   ? Tobacco abuse   ? ? ?Tobacco Use: ?Social History  ? ?Tobacco Use  ?Smoking Status Former  ? Packs/day: 2.00  ? Years: 45.00  ? Pack years: 90.00  ? Types: Cigarettes  ? Quit date: 04/11/2019  ? Years since quitting: 2.4  ?Smokeless Tobacco Never  ? ? ?Labs: ?Review  Flowsheet   ? ?  ?  Latest Ref Rng & Units 01/19/2021  ?Labs for ITP Cardiac and Pulmonary Rehab  ?Cholestrol 0 - 200 mg/dL 295157    ?LDL (calc) 0 - 99 mg/dL 71    ?HDL-C >40 mg/dL 77    ?Trlycerides <150 mg/dL 47    ?  ? ? Multiple values from one day are sorted in reverse-chronological order  ?  ?  ? ? ?Capillary Blood Glucose: ?No results found for: GLUCAP ? ? ?Pulmonary Assessment Scores: ? Pulmonary Assessment Scores   ? ? Row Name 09/13/21 1356  ?  ?  ?  ? ADL UCSD  ? ADL Phase Entry    ?  SOB Score total 73    ? Rest 1    ? Walk 3    ? Stairs 5    ? Bath 3    ? Dress 4    ? Shop 4    ?  ? CAT Score  ? CAT Score 22    ?  ? mMRC Score  ? mMRC Score 3    ? ?  ?  ? ?  ? ?UCSD: ?Self-administered rating of dyspnea associated with activities of daily living (ADLs) ?6-point scale (0 = "not at all" to 5 = "maximal or unable to do because of breathlessness")  ?Scoring Scores range from 0 to 120.  Minimally important difference is 5 units ? ?CAT: ?CAT can identify the health impairment of COPD patients and is better correlated with disease progression.  ?CAT has a scoring range of zero to 40. The CAT score is classified into four groups of low (less than 10), medium (10 - 20), high (21-30) and very high (31-40) based on the impact level of disease on health status. A CAT score over 10 suggests significant symptoms.  A worsening CAT score could be explained by an exacerbation, poor medication adherence, poor inhaler technique, or progression of COPD or comorbid conditions.  ?CAT MCID is 2 points ? ?mMRC: ?mMRC (Modified Medical Research Council) Dyspnea Scale is used to assess the degree of baseline functional disability in patients of respiratory disease due to dyspnea. ?No minimal important difference is established. A decrease in score of 1 point or greater is considered a positive change.  ? ?Pulmonary Function Assessment: ? Pulmonary Function Assessment - 09/13/21 1358   ? ?  ? Post Bronchodilator Spirometry Results  ? FVC% 57 %   ? FEV1% 38 %   ? FEV1/FVC Ratio 51   ?  ? Breath  ? Shortness of Breath Fear of Shortness of Breath;Limiting activity;Panic with Shortness of Breath   ? ?  ?  ? ?  ? ? ?Exercise Target Goals: ?Exercise Program Goal: ?Individual exercise prescription set using results from initial 6 min walk test and THRR while considering  patient?s activity barriers and safety.  ? ?Exercise Prescription Goal: ?Initial exercise prescription builds to 30-45 minutes a day of aerobic activity, 2-3  days per week.  Home exercise guidelines will be given to patient during program as part of exercise prescription that the participant will acknowledge. ? ?Activity Barriers & Risk Stratification: ? Activity Barriers & Cardiac Risk Stratification - 09/13/21 1312   ? ?  ? Activity Barriers & Cardiac Risk Stratification  ? Activity Barriers Arthritis;Back Problems;Deconditioning;Shortness of Breath   ? Cardiac Risk Stratification Moderate   ? ?  ?  ? ?  ? ? ?6 Minute Walk: ? 6 Minute Walk   ? ?  Row Name 09/13/21 1349  ?  ?  ?  ? 6 Minute Walk  ? Phase Initial    ? Distance 1050 feet    ? Walk Time 6 minutes    ? # of Rest Breaks 0    ? MPH 1.99    ? METS 2.94    ? RPE 9    ? Perceived Dyspnea  13    ? VO2 Peak 10.3    ? Symptoms No    ? Resting HR 77 bpm    ? Resting BP 104/60    ? Resting Oxygen Saturation  97 %    ? Exercise Oxygen Saturation  during 6 min walk 91 %    ? Max Ex. HR 100 bpm    ? Max Ex. BP 140/68    ? 2 Minute Post BP 110/64    ?  ? Interval HR  ? 1 Minute HR 89    ? 2 Minute HR 93    ? 3 Minute HR 96    ? 4 Minute HR 100    ? 5 Minute HR 97    ? 6 Minute HR 100    ? 2 Minute Post HR 79    ? Interval Heart Rate? Yes    ?  ? Interval Oxygen  ? Interval Oxygen? Yes    ? Baseline Oxygen Saturation % 97 %    ? 1 Minute Oxygen Saturation % 95 %    ? 1 Minute Liters of Oxygen 0 L    ? 2 Minute Oxygen Saturation % 92 %    ? 2 Minute Liters of Oxygen 0 L    ? 3 Minute Oxygen Saturation % 92 %    ? 3 Minute Liters of Oxygen 0 L    ? 4 Minute Oxygen Saturation % 91 %    ? 4 Minute Liters of Oxygen 0 L    ? 5 Minute Oxygen Saturation % 92 %    ? 5 Minute Liters of Oxygen 0 L    ? 6 Minute Oxygen Saturation % 92 %    ? 6 Minute Liters of Oxygen 0 L    ? 2 Minute Post Oxygen Saturation % 98 %    ? 2 Minute Post Liters of Oxygen 0 L    ? ?  ?  ? ?  ? ? ?Oxygen Initial Assessment: ? Oxygen Initial Assessment - 09/13/21 1356   ? ?  ? Home Oxygen  ? Home Oxygen Device None   ? Sleep Oxygen Prescription None   ? Home  Exercise Oxygen Prescription None   ? Home Resting Oxygen Prescription None   ? ?  ?  ? ?  ? ? ?Oxygen Re-Evaluation: ? ? ?Oxygen Discharge (Final Oxygen Re-Evaluation): ? ? ?Initial Exercise Prescription

## 2021-09-18 ENCOUNTER — Encounter (HOSPITAL_COMMUNITY): Payer: Medicare Other

## 2021-09-18 ENCOUNTER — Encounter (HOSPITAL_COMMUNITY)
Admission: RE | Admit: 2021-09-18 | Discharge: 2021-09-18 | Disposition: A | Discharge status: Home / Self Care | Payer: Medicare Other | Source: Ambulatory Visit | Attending: Emergency Medicine | Admitting: Emergency Medicine

## 2021-09-18 VITALS — Wt 122.1 lb

## 2021-09-18 DIAGNOSIS — J449 Chronic obstructive pulmonary disease, unspecified: Secondary | ICD-10-CM | POA: Diagnosis not present

## 2021-09-18 NOTE — Progress Notes (Signed)
Daily Session Note ? ?Patient Details  ?Name: [name] ?MRN: [id number] ?Date of Birth: July 02, 1955 ?Referring Provider:   ?Flowsheet Row PULMONARY REHAB COPD ORIENTATION from 09/13/2021 in [place]  ?Referring Provider Dr. Lamonte Sakai  ? ?  ? ? ?Encounter Date: 09/18/2021 ? ?Check In: ? Session Check In - 09/18/21 1452   ? ?  ? Check-In  ? Supervising physician immediately available to respond to emergencies [facility] MD immediately available   ? Physician(s) Dr Radford Pax   ? Location [facility]   ? Staff Present Redge Gainer, BS, Exercise Physiologist;Trayshawn Durkin Hassell Done, RN, BSN;Phyllis Billingsley, RN   ? Virtual Visit No   ? Medication changes reported     No   ? Fall or balance concerns reported    No   ? Warm-up and Cool-down Performed as group-led instruction   ? Resistance Training Performed Yes   ? VAD Patient? No   ? PAD/SET Patient? No   ?  ? Pain Assessment  ? Currently in Pain? No/denies   ? Pain Score 0-No pain   ? Multiple Pain Sites No   ? ?  ?  ? ?  ? ? ?Capillary Blood Glucose: ?No results found for this or any previous visit (from the past 24 hour(s)). ? ? ? ?Social History  ? ?Tobacco Use  ?Smoking Status Former  ? Packs/day: 2.00  ? Years: 45.00  ? Pack years: 90.00  ? Types: Cigarettes  ? Quit date: 04/11/2019  ? Years since quitting: 2.4  ?Smokeless Tobacco Never  ? ? ?Goals Met:  ?Independence with exercise equipment ?Exercise tolerated well ?No report of concerns or symptoms today ?Strength training completed today ? ?Goals Unmet:  ?Not Applicable ? ?Comments: Check out at 1600. ? ? ?Dr. Kathie Dike is Medical Director for [facility]. ?

## 2021-09-20 ENCOUNTER — Encounter (HOSPITAL_COMMUNITY): Payer: Medicare Other

## 2021-09-20 DIAGNOSIS — F419 Anxiety disorder, unspecified: Secondary | ICD-10-CM | POA: Diagnosis not present

## 2021-09-20 DIAGNOSIS — J209 Acute bronchitis, unspecified: Secondary | ICD-10-CM | POA: Diagnosis not present

## 2021-09-22 DIAGNOSIS — J209 Acute bronchitis, unspecified: Secondary | ICD-10-CM | POA: Diagnosis not present

## 2021-09-22 DIAGNOSIS — J40 Bronchitis, not specified as acute or chronic: Secondary | ICD-10-CM | POA: Diagnosis not present

## 2021-09-22 DIAGNOSIS — J0100 Acute maxillary sinusitis, unspecified: Secondary | ICD-10-CM | POA: Diagnosis not present

## 2021-09-23 DIAGNOSIS — J189 Pneumonia, unspecified organism: Secondary | ICD-10-CM | POA: Diagnosis not present

## 2021-09-23 DIAGNOSIS — J449 Chronic obstructive pulmonary disease, unspecified: Secondary | ICD-10-CM | POA: Diagnosis not present

## 2021-09-23 DIAGNOSIS — J0100 Acute maxillary sinusitis, unspecified: Secondary | ICD-10-CM | POA: Diagnosis not present

## 2021-09-24 ENCOUNTER — Inpatient Hospital Stay (HOSPITAL_COMMUNITY)
Admission: EM | Admit: 2021-09-24 | Discharge: 2021-11-14 | DRG: 004 | Disposition: A | Discharge status: Rehabilitation Facility | Payer: Medicare Other | Attending: Internal Medicine | Admitting: Internal Medicine

## 2021-09-24 ENCOUNTER — Telehealth: Payer: Medicare Other | Admitting: Nurse Practitioner

## 2021-09-24 ENCOUNTER — Emergency Department (HOSPITAL_COMMUNITY): Payer: Medicare Other

## 2021-09-24 ENCOUNTER — Telehealth: Payer: Self-pay | Admitting: Emergency Medicine

## 2021-09-24 ENCOUNTER — Encounter (HOSPITAL_COMMUNITY): Payer: Self-pay | Admitting: *Deleted

## 2021-09-24 ENCOUNTER — Other Ambulatory Visit: Payer: Self-pay

## 2021-09-24 ENCOUNTER — Encounter: Payer: Self-pay | Admitting: Nurse Practitioner

## 2021-09-24 DIAGNOSIS — R1310 Dysphagia, unspecified: Secondary | ICD-10-CM | POA: Diagnosis not present

## 2021-09-24 DIAGNOSIS — R579 Shock, unspecified: Secondary | ICD-10-CM | POA: Diagnosis not present

## 2021-09-24 DIAGNOSIS — L8990 Pressure ulcer of unspecified site, unspecified stage: Secondary | ICD-10-CM | POA: Diagnosis not present

## 2021-09-24 DIAGNOSIS — N3010 Interstitial cystitis (chronic) without hematuria: Secondary | ICD-10-CM | POA: Diagnosis not present

## 2021-09-24 DIAGNOSIS — J449 Chronic obstructive pulmonary disease, unspecified: Secondary | ICD-10-CM

## 2021-09-24 DIAGNOSIS — J9690 Respiratory failure, unspecified, unspecified whether with hypoxia or hypercapnia: Secondary | ICD-10-CM | POA: Diagnosis not present

## 2021-09-24 DIAGNOSIS — G7281 Critical illness myopathy: Secondary | ICD-10-CM | POA: Diagnosis not present

## 2021-09-24 DIAGNOSIS — M21372 Foot drop, left foot: Secondary | ICD-10-CM | POA: Diagnosis not present

## 2021-09-24 DIAGNOSIS — G9341 Metabolic encephalopathy: Secondary | ICD-10-CM | POA: Diagnosis not present

## 2021-09-24 DIAGNOSIS — G8929 Other chronic pain: Secondary | ICD-10-CM | POA: Diagnosis present

## 2021-09-24 DIAGNOSIS — J441 Chronic obstructive pulmonary disease with (acute) exacerbation: Principal | ICD-10-CM

## 2021-09-24 DIAGNOSIS — R0603 Acute respiratory distress: Secondary | ICD-10-CM | POA: Diagnosis not present

## 2021-09-24 DIAGNOSIS — D696 Thrombocytopenia, unspecified: Secondary | ICD-10-CM | POA: Diagnosis not present

## 2021-09-24 DIAGNOSIS — I5043 Acute on chronic combined systolic (congestive) and diastolic (congestive) heart failure: Secondary | ICD-10-CM | POA: Diagnosis present

## 2021-09-24 DIAGNOSIS — R5381 Other malaise: Secondary | ICD-10-CM | POA: Diagnosis not present

## 2021-09-24 DIAGNOSIS — F419 Anxiety disorder, unspecified: Secondary | ICD-10-CM

## 2021-09-24 DIAGNOSIS — K7200 Acute and subacute hepatic failure without coma: Secondary | ICD-10-CM | POA: Diagnosis not present

## 2021-09-24 DIAGNOSIS — I5021 Acute systolic (congestive) heart failure: Secondary | ICD-10-CM | POA: Diagnosis not present

## 2021-09-24 DIAGNOSIS — R070 Pain in throat: Secondary | ICD-10-CM | POA: Diagnosis not present

## 2021-09-24 DIAGNOSIS — T380X5A Adverse effect of glucocorticoids and synthetic analogues, initial encounter: Secondary | ICD-10-CM | POA: Diagnosis present

## 2021-09-24 DIAGNOSIS — E872 Acidosis, unspecified: Secondary | ICD-10-CM | POA: Diagnosis not present

## 2021-09-24 DIAGNOSIS — F410 Panic disorder [episodic paroxysmal anxiety] without agoraphobia: Secondary | ICD-10-CM | POA: Diagnosis present

## 2021-09-24 DIAGNOSIS — J189 Pneumonia, unspecified organism: Secondary | ICD-10-CM | POA: Diagnosis not present

## 2021-09-24 DIAGNOSIS — K760 Fatty (change of) liver, not elsewhere classified: Secondary | ICD-10-CM | POA: Diagnosis not present

## 2021-09-24 DIAGNOSIS — Z87891 Personal history of nicotine dependence: Secondary | ICD-10-CM

## 2021-09-24 DIAGNOSIS — R8271 Bacteriuria: Secondary | ICD-10-CM | POA: Diagnosis not present

## 2021-09-24 DIAGNOSIS — L89152 Pressure ulcer of sacral region, stage 2: Secondary | ICD-10-CM | POA: Diagnosis not present

## 2021-09-24 DIAGNOSIS — I110 Hypertensive heart disease with heart failure: Secondary | ICD-10-CM | POA: Diagnosis present

## 2021-09-24 DIAGNOSIS — M21371 Foot drop, right foot: Secondary | ICD-10-CM | POA: Diagnosis not present

## 2021-09-24 DIAGNOSIS — Z930 Tracheostomy status: Secondary | ICD-10-CM | POA: Diagnosis not present

## 2021-09-24 DIAGNOSIS — I10 Essential (primary) hypertension: Secondary | ICD-10-CM | POA: Diagnosis not present

## 2021-09-24 DIAGNOSIS — J392 Other diseases of pharynx: Secondary | ICD-10-CM | POA: Diagnosis not present

## 2021-09-24 DIAGNOSIS — N390 Urinary tract infection, site not specified: Secondary | ICD-10-CM | POA: Diagnosis not present

## 2021-09-24 DIAGNOSIS — Z90710 Acquired absence of both cervix and uterus: Secondary | ICD-10-CM

## 2021-09-24 DIAGNOSIS — J9601 Acute respiratory failure with hypoxia: Secondary | ICD-10-CM | POA: Diagnosis not present

## 2021-09-24 DIAGNOSIS — R000 Tachycardia, unspecified: Secondary | ICD-10-CM | POA: Diagnosis not present

## 2021-09-24 DIAGNOSIS — Z8249 Family history of ischemic heart disease and other diseases of the circulatory system: Secondary | ICD-10-CM

## 2021-09-24 DIAGNOSIS — E43 Unspecified severe protein-calorie malnutrition: Secondary | ICD-10-CM | POA: Diagnosis present

## 2021-09-24 DIAGNOSIS — E870 Hyperosmolality and hypernatremia: Secondary | ICD-10-CM | POA: Diagnosis not present

## 2021-09-24 DIAGNOSIS — G6281 Critical illness polyneuropathy: Secondary | ICD-10-CM | POA: Diagnosis not present

## 2021-09-24 DIAGNOSIS — R911 Solitary pulmonary nodule: Secondary | ICD-10-CM | POA: Diagnosis not present

## 2021-09-24 DIAGNOSIS — R062 Wheezing: Secondary | ICD-10-CM | POA: Diagnosis not present

## 2021-09-24 DIAGNOSIS — J150 Pneumonia due to Klebsiella pneumoniae: Secondary | ICD-10-CM | POA: Diagnosis not present

## 2021-09-24 DIAGNOSIS — K56609 Unspecified intestinal obstruction, unspecified as to partial versus complete obstruction: Secondary | ICD-10-CM | POA: Diagnosis not present

## 2021-09-24 DIAGNOSIS — Z79899 Other long term (current) drug therapy: Secondary | ICD-10-CM | POA: Diagnosis not present

## 2021-09-24 DIAGNOSIS — R7401 Elevation of levels of liver transaminase levels: Secondary | ICD-10-CM | POA: Diagnosis not present

## 2021-09-24 DIAGNOSIS — K219 Gastro-esophageal reflux disease without esophagitis: Secondary | ICD-10-CM | POA: Diagnosis not present

## 2021-09-24 DIAGNOSIS — J9622 Acute and chronic respiratory failure with hypercapnia: Secondary | ICD-10-CM | POA: Diagnosis not present

## 2021-09-24 DIAGNOSIS — Z7951 Long term (current) use of inhaled steroids: Secondary | ICD-10-CM

## 2021-09-24 DIAGNOSIS — M549 Dorsalgia, unspecified: Secondary | ICD-10-CM | POA: Diagnosis present

## 2021-09-24 DIAGNOSIS — I5041 Acute combined systolic (congestive) and diastolic (congestive) heart failure: Secondary | ICD-10-CM | POA: Diagnosis not present

## 2021-09-24 DIAGNOSIS — R140 Abdominal distension (gaseous): Secondary | ICD-10-CM | POA: Diagnosis not present

## 2021-09-24 DIAGNOSIS — J439 Emphysema, unspecified: Principal | ICD-10-CM | POA: Diagnosis present

## 2021-09-24 DIAGNOSIS — R739 Hyperglycemia, unspecified: Secondary | ICD-10-CM | POA: Diagnosis not present

## 2021-09-24 DIAGNOSIS — E876 Hypokalemia: Secondary | ICD-10-CM | POA: Diagnosis not present

## 2021-09-24 DIAGNOSIS — K56600 Partial intestinal obstruction, unspecified as to cause: Secondary | ICD-10-CM | POA: Diagnosis not present

## 2021-09-24 DIAGNOSIS — R918 Other nonspecific abnormal finding of lung field: Secondary | ICD-10-CM | POA: Diagnosis not present

## 2021-09-24 DIAGNOSIS — Z9911 Dependence on respirator [ventilator] status: Secondary | ICD-10-CM | POA: Diagnosis not present

## 2021-09-24 DIAGNOSIS — Z20822 Contact with and (suspected) exposure to covid-19: Secondary | ICD-10-CM | POA: Diagnosis present

## 2021-09-24 DIAGNOSIS — R0902 Hypoxemia: Secondary | ICD-10-CM | POA: Diagnosis not present

## 2021-09-24 DIAGNOSIS — I700 Atherosclerosis of aorta: Secondary | ICD-10-CM | POA: Diagnosis not present

## 2021-09-24 DIAGNOSIS — Z681 Body mass index (BMI) 19 or less, adult: Secondary | ICD-10-CM | POA: Diagnosis not present

## 2021-09-24 DIAGNOSIS — Z6821 Body mass index (BMI) 21.0-21.9, adult: Secondary | ICD-10-CM

## 2021-09-24 DIAGNOSIS — I471 Supraventricular tachycardia: Secondary | ICD-10-CM | POA: Diagnosis not present

## 2021-09-24 DIAGNOSIS — K567 Ileus, unspecified: Secondary | ICD-10-CM | POA: Diagnosis not present

## 2021-09-24 DIAGNOSIS — N1330 Unspecified hydronephrosis: Secondary | ICD-10-CM | POA: Diagnosis not present

## 2021-09-24 DIAGNOSIS — Z906 Acquired absence of other parts of urinary tract: Secondary | ICD-10-CM

## 2021-09-24 DIAGNOSIS — D6489 Other specified anemias: Secondary | ICD-10-CM | POA: Diagnosis not present

## 2021-09-24 DIAGNOSIS — Y95 Nosocomial condition: Secondary | ICD-10-CM | POA: Diagnosis not present

## 2021-09-24 DIAGNOSIS — F411 Generalized anxiety disorder: Secondary | ICD-10-CM | POA: Diagnosis not present

## 2021-09-24 DIAGNOSIS — I5030 Unspecified diastolic (congestive) heart failure: Secondary | ICD-10-CM | POA: Diagnosis not present

## 2021-09-24 DIAGNOSIS — J9621 Acute and chronic respiratory failure with hypoxia: Secondary | ICD-10-CM | POA: Diagnosis present

## 2021-09-24 DIAGNOSIS — I255 Ischemic cardiomyopathy: Secondary | ICD-10-CM | POA: Diagnosis not present

## 2021-09-24 DIAGNOSIS — G9340 Encephalopathy, unspecified: Secondary | ICD-10-CM

## 2021-09-24 DIAGNOSIS — R0689 Other abnormalities of breathing: Secondary | ICD-10-CM | POA: Diagnosis not present

## 2021-09-24 DIAGNOSIS — R4182 Altered mental status, unspecified: Secondary | ICD-10-CM | POA: Diagnosis not present

## 2021-09-24 DIAGNOSIS — I429 Cardiomyopathy, unspecified: Secondary | ICD-10-CM | POA: Diagnosis present

## 2021-09-24 DIAGNOSIS — J181 Lobar pneumonia, unspecified organism: Secondary | ICD-10-CM | POA: Diagnosis not present

## 2021-09-24 DIAGNOSIS — Z4682 Encounter for fitting and adjustment of non-vascular catheter: Secondary | ICD-10-CM | POA: Diagnosis not present

## 2021-09-24 DIAGNOSIS — Z4659 Encounter for fitting and adjustment of other gastrointestinal appliance and device: Secondary | ICD-10-CM | POA: Diagnosis not present

## 2021-09-24 DIAGNOSIS — J9602 Acute respiratory failure with hypercapnia: Secondary | ICD-10-CM | POA: Diagnosis not present

## 2021-09-24 DIAGNOSIS — E7800 Pure hypercholesterolemia, unspecified: Secondary | ICD-10-CM | POA: Diagnosis present

## 2021-09-24 DIAGNOSIS — I5023 Acute on chronic systolic (congestive) heart failure: Secondary | ICD-10-CM | POA: Diagnosis not present

## 2021-09-24 DIAGNOSIS — R0602 Shortness of breath: Secondary | ICD-10-CM | POA: Diagnosis not present

## 2021-09-24 DIAGNOSIS — D72829 Elevated white blood cell count, unspecified: Secondary | ICD-10-CM | POA: Diagnosis present

## 2021-09-24 DIAGNOSIS — K6389 Other specified diseases of intestine: Secondary | ICD-10-CM | POA: Diagnosis not present

## 2021-09-24 DIAGNOSIS — K560 Paralytic ileus: Secondary | ICD-10-CM | POA: Diagnosis not present

## 2021-09-24 DIAGNOSIS — E871 Hypo-osmolality and hyponatremia: Secondary | ICD-10-CM | POA: Diagnosis not present

## 2021-09-24 DIAGNOSIS — I5022 Chronic systolic (congestive) heart failure: Secondary | ICD-10-CM

## 2021-09-24 DIAGNOSIS — R578 Other shock: Secondary | ICD-10-CM | POA: Diagnosis not present

## 2021-09-24 DIAGNOSIS — R7989 Other specified abnormal findings of blood chemistry: Secondary | ICD-10-CM | POA: Diagnosis present

## 2021-09-24 DIAGNOSIS — K5669 Other partial intestinal obstruction: Secondary | ICD-10-CM | POA: Diagnosis not present

## 2021-09-24 DIAGNOSIS — E785 Hyperlipidemia, unspecified: Secondary | ICD-10-CM

## 2021-09-24 DIAGNOSIS — N134 Hydroureter: Secondary | ICD-10-CM | POA: Diagnosis not present

## 2021-09-24 HISTORY — DX: Interstitial cystitis (chronic) without hematuria: N30.10

## 2021-09-24 LAB — COMPREHENSIVE METABOLIC PANEL
ALT: 39 U/L (ref 0–44)
AST: 42 U/L — ABNORMAL HIGH (ref 15–41)
Albumin: 4.7 g/dL (ref 3.5–5.0)
Alkaline Phosphatase: 101 U/L (ref 38–126)
Anion gap: 13 (ref 5–15)
BUN: 26 mg/dL — ABNORMAL HIGH (ref 8–23)
CO2: 24 mmol/L (ref 22–32)
Calcium: 9.6 mg/dL (ref 8.9–10.3)
Chloride: 100 mmol/L (ref 98–111)
Creatinine, Ser: 0.88 mg/dL (ref 0.44–1.00)
GFR, Estimated: >60 mL/min (ref >60)
Glucose, Bld: 140 mg/dL — ABNORMAL HIGH (ref 70–99)
Potassium: 3.4 mmol/L — ABNORMAL LOW (ref 3.5–5.1)
Sodium: 137 mmol/L (ref 135–145)
Total Bilirubin: 0.7 mg/dL (ref 0.3–1.2)
Total Protein: 8.5 g/dL — ABNORMAL HIGH (ref 6.5–8.1)

## 2021-09-24 LAB — CBC WITH DIFFERENTIAL/PLATELET
Abs Immature Granulocytes: 0.05 10*3/uL (ref 0.00–0.07)
Basophils Absolute: 0 10*3/uL (ref 0.0–0.1)
Basophils Relative: 0 %
Eosinophils Absolute: 0 10*3/uL (ref 0.0–0.5)
Eosinophils Relative: 0 %
HCT: 43.9 % (ref 36.0–46.0)
Hemoglobin: 14.8 g/dL (ref 12.0–15.0)
Immature Granulocytes: 0 %
Lymphocytes Relative: 9 %
Lymphs Abs: 1.1 10*3/uL (ref 0.7–4.0)
MCH: 32.4 pg (ref 26.0–34.0)
MCHC: 33.7 g/dL (ref 30.0–36.0)
MCV: 96.1 fL (ref 80.0–100.0)
Monocytes Absolute: 0.9 10*3/uL (ref 0.1–1.0)
Monocytes Relative: 8 %
Neutro Abs: 9.9 10*3/uL — ABNORMAL HIGH (ref 1.7–7.7)
Neutrophils Relative %: 83 %
Platelets: 162 10*3/uL (ref 150–400)
RBC: 4.57 MIL/uL (ref 3.87–5.11)
RDW: 13.1 % (ref 11.5–15.5)
WBC: 12 10*3/uL — ABNORMAL HIGH (ref 4.0–10.5)
nRBC: 0 % (ref 0.0–0.2)

## 2021-09-24 LAB — BLOOD GAS, VENOUS
Acid-Base Excess: 5.2 mmol/L — ABNORMAL HIGH (ref 0.0–2.0)
Bicarbonate: 31.1 mmol/L — ABNORMAL HIGH (ref 20.0–28.0)
Drawn by: 6352
FIO2: 35 %
O2 Saturation: 29.4 %
Patient temperature: 37.1
pCO2, Ven: 49 mmHg (ref 44–60)
pH, Ven: 7.41 (ref 7.25–7.43)
pO2, Ven: <31 mmHg — CL (ref 32–45)

## 2021-09-24 LAB — LACTIC ACID, PLASMA
Lactic Acid, Venous: 1.8 mmol/L (ref 0.5–1.9)
Lactic Acid, Venous: 2.2 mmol/L (ref 0.5–1.9)
Lactic Acid, Venous: 1.7 mmol/L (ref 0.5–1.9)

## 2021-09-24 LAB — RESP PANEL BY RT-PCR (FLU A&B, COVID) ARPGX2
Influenza A by PCR: NEGATIVE
Influenza B by PCR: NEGATIVE
SARS Coronavirus 2 by RT PCR: NEGATIVE

## 2021-09-24 LAB — TROPONIN I (HIGH SENSITIVITY)
Troponin I (High Sensitivity): 7 ng/L (ref <18)
Troponin I (High Sensitivity): 12 ng/L (ref <18)

## 2021-09-24 MED ORDER — ENOXAPARIN SODIUM 40 MG/0.4ML IJ SOSY
40.0000 mg | PREFILLED_SYRINGE | INTRAMUSCULAR | Status: DC
  Administered 2021-09-24 – 2021-10-07 (×14): 40 mg via SUBCUTANEOUS
  Filled 2021-09-24 (×13): qty 0.4

## 2021-09-24 MED ORDER — AZITHROMYCIN 250 MG PO TABS
500.0000 mg | ORAL_TABLET | Freq: Every day | ORAL | Status: AC
  Administered 2021-09-25 – 2021-09-28 (×4): 500 mg via ORAL
  Filled 2021-09-24 (×4): qty 2

## 2021-09-24 MED ORDER — PREDNISONE 20 MG PO TABS: 40.0000 mg | ORAL_TABLET | Freq: Every day | ORAL | Status: DC

## 2021-09-24 MED ORDER — ACETAMINOPHEN 650 MG RE SUPP: 650.0000 mg | Freq: Four times a day (QID) | RECTAL | Status: DC | PRN

## 2021-09-24 MED ORDER — LORATADINE 10 MG PO TABS: 10.0000 mg | ORAL_TABLET | Freq: Every day | ORAL | Status: DC | PRN

## 2021-09-24 MED ORDER — ACETAMINOPHEN 325 MG PO TABS: 650.0000 mg | ORAL_TABLET | Freq: Four times a day (QID) | ORAL | Status: DC | PRN

## 2021-09-24 MED ORDER — LORAZEPAM 1 MG PO TABS
1.0000 mg | ORAL_TABLET | Freq: Once | ORAL | Status: AC
  Administered 2021-09-24: 1 mg via ORAL
  Filled 2021-09-24: qty 1

## 2021-09-24 MED ORDER — MAGNESIUM SULFATE 2 GM/50ML IV SOLN
2.0000 g | Freq: Once | INTRAVENOUS | Status: AC
Start: 2021-09-24 — End: 2021-09-24
  Administered 2021-09-24: 2 g via INTRAVENOUS
  Filled 2021-09-24: qty 50

## 2021-09-24 MED ORDER — SODIUM CHLORIDE 0.9 % IV SOLN
500.0000 mg | INTRAVENOUS | Status: AC
  Administered 2021-09-24: 500 mg via INTRAVENOUS
  Filled 2021-09-24: qty 5

## 2021-09-24 MED ORDER — IPRATROPIUM-ALBUTEROL 0.5-2.5 (3) MG/3ML IN SOLN
3.0000 mL | RESPIRATORY_TRACT | Status: DC | PRN
  Administered 2021-09-25: 3 mL via RESPIRATORY_TRACT

## 2021-09-24 MED ORDER — POLYETHYLENE GLYCOL 3350 17 G PO PACK: 17.0000 g | PACK | Freq: Every day | ORAL | Status: DC | PRN

## 2021-09-24 MED ORDER — IPRATROPIUM-ALBUTEROL 0.5-2.5 (3) MG/3ML IN SOLN
3.0000 mL | Freq: Four times a day (QID) | RESPIRATORY_TRACT | Status: AC
  Administered 2021-09-24 – 2021-09-25 (×4): 3 mL via RESPIRATORY_TRACT
  Filled 2021-09-24 (×4): qty 3

## 2021-09-24 MED ORDER — METHYLPREDNISOLONE SODIUM SUCC 125 MG IJ SOLR
125.0000 mg | Freq: Once | INTRAMUSCULAR | Status: AC
  Administered 2021-09-24: 125 mg via INTRAVENOUS
  Filled 2021-09-24: qty 2

## 2021-09-24 MED ORDER — SODIUM CHLORIDE 0.9% FLUSH: 3.0000 mL | Freq: Once | INTRAVENOUS | Status: DC

## 2021-09-24 MED ORDER — METHYLPREDNISOLONE SODIUM SUCC 125 MG IJ SOLR
60.0000 mg | Freq: Two times a day (BID) | INTRAMUSCULAR | Status: AC
  Administered 2021-09-25 (×2): 60 mg via INTRAVENOUS
  Filled 2021-09-24 (×2): qty 2

## 2021-09-24 MED ORDER — POTASSIUM CHLORIDE CRYS ER 20 MEQ PO TBCR
40.0000 meq | EXTENDED_RELEASE_TABLET | Freq: Once | ORAL | Status: DC
  Filled 2021-09-24: qty 2

## 2021-09-24 MED ORDER — IPRATROPIUM-ALBUTEROL 0.5-2.5 (3) MG/3ML IN SOLN
3.0000 mL | Freq: Once | RESPIRATORY_TRACT | Status: AC
  Administered 2021-09-24: 3 mL via RESPIRATORY_TRACT
  Filled 2021-09-24: qty 3

## 2021-09-24 MED ORDER — SODIUM CHLORIDE 0.9 % IV BOLUS
1000.0000 mL | Freq: Once | INTRAVENOUS | Status: AC
Start: 2021-09-24 — End: 2021-09-24
  Administered 2021-09-24: 1000 mL via INTRAVENOUS

## 2021-09-24 MED ORDER — PANTOPRAZOLE SODIUM 40 MG PO TBEC: 40.0000 mg | DELAYED_RELEASE_TABLET | Freq: Every day | ORAL | Status: DC

## 2021-09-24 MED ORDER — UMECLIDINIUM BROMIDE 62.5 MCG/ACT IN AEPB
1.0000 | INHALATION_SPRAY | Freq: Every day | RESPIRATORY_TRACT | Status: DC
  Administered 2021-09-26: 1 via RESPIRATORY_TRACT
  Filled 2021-09-24: qty 7

## 2021-09-24 MED ORDER — LORAZEPAM 2 MG/ML IJ SOLN
0.5000 mg | Freq: Once | INTRAMUSCULAR | Status: AC
  Administered 2021-09-24: 0.5 mg via INTRAVENOUS
  Filled 2021-09-24: qty 1

## 2021-09-24 MED ORDER — LORAZEPAM 2 MG/ML IJ SOLN
0.5000 mg | INTRAMUSCULAR | Status: DC | PRN
  Administered 2021-09-25 (×3): 0.5 mg via INTRAVENOUS
  Filled 2021-09-24 (×3): qty 1

## 2021-09-24 MED ORDER — BUDESON-GLYCOPYRROL-FORMOTEROL 160-9-4.8 MCG/ACT IN AERO: 2.0000 | INHALATION_SPRAY | Freq: Two times a day (BID) | RESPIRATORY_TRACT | Status: DC

## 2021-09-24 MED ORDER — MONTELUKAST SODIUM 10 MG PO TABS
10.0000 mg | ORAL_TABLET | Freq: Every morning | ORAL | Status: DC
  Administered 2021-09-25 – 2021-09-29 (×5): 10 mg via ORAL
  Filled 2021-09-24 (×5): qty 1

## 2021-09-24 MED ORDER — FLUTICASONE FUROATE-VILANTEROL 200-25 MCG/ACT IN AEPB: 1.0000 | INHALATION_SPRAY | Freq: Every day | RESPIRATORY_TRACT | Status: DC

## 2021-09-24 NOTE — Telephone Encounter (Signed)
Spoke with the pt  ?She states having increased SOB, cough and wheezing  ?She is being treated with doxy and pred for recent pna and feels not improving  ?She is unable to come in but willing to to video visit  ?Appt for video visit for 2:30 today with Katie ?

## 2021-09-24 NOTE — Assessment & Plan Note (Signed)
Desaturations at home on room air to 70's-80's. Advised she seek further care at ED. ?

## 2021-09-24 NOTE — ED Provider Notes (Addendum)
?[facility] ?Provider Note ? ? ?CSN: [id number] ?Arrival date & time: 09/24/21  1632 ? ?  ? ?History ?Chief Complaint  ?Patient presents with  ? Shortness of Breath  ? ? ?[name] is a 66 y.o. female with history of COPD presents emerged department for evaluation of worsening shortness of breath for the past 5 days.  Patient reports she was seen by her primary care office on Wednesday and received a steroid shot and was placed on azithromycin.  She reports she was also placed on a prednisone pill but does not remember the dose.  She reports that she continued to be short of breath and on Sunday they switched her antibiotic from azithromycin to doxycycline and gave her 2 breathing treatments.  She reports that she had shortness of breath still worsening, she ordered at home oxygen and arrive 30 minutes prior to her arrival to the emergency department.  She reports still no help with the at home oxygen so called EMS.  EMS reports her O2 sats were in the 80s on arrival. She denies any fevers or any nasal congestion and rhinorrhea.  Recent cough, she denies any chest pain but mention she does have some chest tightness.  She reports she has been on at home oxygen in the past but its been years ago.  She reports compliance with her medications.  Reports she is recently been visiting her husband who has been in the hospital. ? ? ?Shortness of Breath ?Associated symptoms: cough   ?Associated symptoms: no abdominal pain, no chest pain, no fever and no vomiting   ? ?  ? ?Home Medications ?Prior to Admission medications   ?Medication Sig Start Date End Date Taking? Authorizing Provider  ?albuterol (VENTOLIN HFA) 108 (90 Base) MCG/ACT inhaler INHALE 2 PUFFS INTO THE LUNGS EVERY 6 HOURS AS NEEDED FOR WHEEZE/SHORTNESS OF BREATH 12/14/20   Collene Gobble, MD  ?ALPRAZolam Duanne Moron) 1 MG tablet Take 0.5-1 mg by mouth at bedtime as needed for sleep or anxiety. 03/12/19   [provider]  ?arformoterol  (BROVANA) 15 MCG/2ML NEBU Take 2 mLs (15 mcg total) by nebulization 2 (two) times daily. ?Patient not taking: Reported on 09/24/2021 02/06/21   Collene Gobble, MD  ?Budeson-Glycopyrrol-Formoterol (BREZTRI AEROSPHERE) 160-9-4.8 MCG/ACT AERO Inhale 2 puffs into the lungs 2 (two) times daily. ?Patient taking differently: Inhale 2 puffs into the lungs in the morning. 12/21/20   Collene Gobble, MD  ?Budeson-Glycopyrrol-Formoterol (BREZTRI AEROSPHERE) 160-9-4.8 MCG/ACT AERO Inhale 2 puffs into the lungs in the morning and at bedtime. 02/06/21   Parrett, Fonnie Mu, NP  ?docusate sodium (COLACE) 100 MG capsule Take 1 capsule (100 mg total) by mouth 2 (two) times daily. ?Patient taking differently: Take 100 mg by mouth 2 (two) times daily as needed (constipation). 05/02/19   Arlan Organ, DO  ?fluticasone (FLONASE) 50 MCG/ACT nasal spray Place 1 spray into both nostrils daily as needed for allergies.    [provider]  ?ibuprofen (ADVIL) 200 MG tablet Take 400 mg by mouth every 8 (eight) hours as needed (pain.). ?Patient not taking: Reported on 09/24/2021    [provider]  ?loratadine (CLARITIN) 10 MG tablet Take 10 mg by mouth daily as needed for allergies.    [provider]  ?Magnesium 250 MG TABS Take 500 mg by mouth in the morning.    [provider]  ?montelukast (SINGULAIR) 10 MG tablet Take 10 mg by mouth in the morning.  [provider]  ?omeprazole (PRILOSEC) 20 MG capsule Take 20 mg by mouth 2 (two) times daily as needed (indigestion/heartburn).    [provider]  ?revefenacin (YUPELRI) 175 MCG/3ML nebulizer solution Take 3 mLs (175 mcg total) by nebulization daily. ?Patient not taking: Reported on 09/12/2021 02/06/21   Collene Gobble, MD  ?rosuvastatin (CRESTOR) 5 MG tablet Take 1 tablet (5 mg total) by mouth daily. ?Patient not taking: Reported on 09/24/2021 01/22/21 09/13/23  Fay Records, MD  ?[facility] HANDIHALER 18 MCG inhalation capsule INHALE 1 CAPSULE VIA  HANDIHALER ONCE DAILY AT THE SAME TIME EVERY DAY 07/17/20   Parrett, Fonnie Mu, NP  ?   ? ?Allergies    ?Patient has no known allergies.   ? ?Review of Systems   ?Review of Systems  ?Constitutional:  Negative for chills and fever.  ?HENT:  Negative for congestion and rhinorrhea.   ?Respiratory:  Positive for cough, chest tightness and shortness of breath.   ?Cardiovascular:  Negative for chest pain and palpitations.  ?Gastrointestinal:  Negative for abdominal pain, diarrhea and vomiting.  ? ?Physical Exam ?Updated Vital Signs ?BP (!) 148/84 (BP Location: Right Arm)   Pulse (!) 139   Temp 98.8 ?F (37.1 ?C) (Oral)   Resp (!) 36   Ht 5' (1.524 m)   Wt 55.4 kg   SpO2 98%   BMI 23.85 kg/m?  ?Physical Exam ?Vitals and nursing note reviewed.  ?Constitutional:   ?   Appearance: Normal appearance. She is ill-appearing.  ?HENT:  ?   Head: Normocephalic and atraumatic.  ?   Mouth/Throat:  ?   Mouth: Mucous membranes are moist.  ?Eyes:  ?   General: No scleral icterus. ?Cardiovascular:  ?   Rate and Rhythm: Tachycardia present.  ?Pulmonary:  ?   Effort: Tachypnea and accessory muscle usage present.  ?   Breath sounds: Decreased breath sounds and wheezing present.  ?   Comments: Patient has respiratory rate between 36 and 40.  Some supraclavicular retractions noted.  She is only able to speak few words without any to take another breath.  She has expiratory wheezing diffusely.  She is unable to take a deep inhalation, so not able to appreciate any rhonchi that was appreciated by EMS.  No nasal flaring or cyanosis. ?Chest:  ?   Chest wall: No tenderness.  ?Abdominal:  ?   General: Abdomen is flat. Bowel sounds are normal.  ?   Palpations: Abdomen is soft.  ?   Tenderness: There is no abdominal tenderness. There is no guarding or rebound.  ?Musculoskeletal:     ?   General: No deformity.  ?   Cervical back: Normal range of motion.  ?   Right lower leg: No edema.  ?   Left lower leg: No edema.  ?Skin: ?   General: Skin is warm  and dry.  ?Neurological:  ?   General: No focal deficit present.  ?   Mental Status: She is alert. Mental status is at baseline.  ? ? ?ED Results / Procedures / Treatments   ?Labs ?(all labs ordered are listed, but only abnormal results are displayed) ?Labs Reviewed  ?CBC WITH DIFFERENTIAL/PLATELET - Abnormal; Notable for the following components:  ?    Result Value  ? WBC 12.0 (*)   ? Neutro Abs 9.9 (*)   ? All other components within normal limits  ?RESP PANEL BY RT-PCR (FLU A&B, COVID) [id number]  ?LACTIC ACID, PLASMA  ?LACTIC ACID, PLASMA  ?  COMPREHENSIVE METABOLIC PANEL  ?URINALYSIS, ROUTINE W REFLEX MICROSCOPIC  ? ? ?EKG ?EKG Interpretation ? ?Date/Time:  Monday September 24 2021 17:15:21 EDT ?Ventricular Rate:  133 ?PR Interval:  136 ?QRS Duration: 116 ?QT Interval:  276 ?QTC Calculation: 411 ?R Axis:   36 ?Text Interpretation: Sinus tachycardia Atrial premature complexes Consider right atrial enlargement Incomplete right bundle branch block Artifact in lead(s) I II III aVR aVL aVF V1 V2 V3 V4 V5 V6 New Tachycardia otherwise no significant change from 2015 Confirmed by Fredia Sorrow [phone]) on 09/24/2021 5:40:16 PM ? ?Radiology ?DG Chest 2 View ? ?Result Date: 09/24/2021 ?CLINICAL DATA:  shortness of breath EXAM: CHEST - 2 VIEW COMPARISON:  March 8, 21. FINDINGS: Emphysema. Chronically prominent lung markings at the lung bases. No consolidation. No visible pleural effusions or pneumothorax. Cardiomediastinal silhouette is within normal limits and similar to prior. No displaced fracture. IMPRESSION: 1. No evidence of acute cardiopulmonary disease. 2. Emphysema (ICD10-J43.9). Electronically Signed   By: Margaretha Sheffield M.D.   On: 09/24/2021 18:06   ? ?Procedures ?Marland KitchenCritical Care ?Performed by: Sherrell Puller, PA-C ?Authorized by: Sherrell Puller, PA-C  ? ?Critical care provider statement:  ?  Critical care time (minutes):  30 ?  Critical care was necessary to treat or prevent imminent or life-threatening deterioration of  the following conditions:  Respiratory failure ?  Critical care was time spent personally by me on the following activities:  Development of treatment plan with patient or surrogate, discussions with consulta

## 2021-09-24 NOTE — ED Triage Notes (Signed)
Pt brought in by [facility] from home with c/o SOB x 1 week. Pt has COPD and has been seen by PCP and given steroids and placed on home oxygen. EMS reports diminished lower lung fields. Duoneb was given and EMS reports pt now has rhonchi throughout. Pt reports O2 sat drops into the 80s when she's ambulating at home. BP 113/90, HR 120-130 for EMS.  ?

## 2021-09-24 NOTE — Progress Notes (Signed)
? ?Patient ID: [name], female     DOB: 1955-06-25, 66 y.o.      MRN: [id number] ? ?Chief Complaint  ?Patient presents with  ? Follow-up  ?  Pneumonia - needs neb solution called in  ?Wheezing and sob, seen in urgent care and dx with pn  ? ? ?Virtual Visit via Video Note ? ?I connected with [name] on 09/24/21 at  2:30 PM EDT by a video enabled telemedicine application and verified that I am speaking with the correct person using two identifiers. ? ?Location: ?Patient: Home ?Provider: Office ?  ?I discussed the limitations of evaluation and management by telemedicine and the availability of in person appointments. The patient expressed understanding and agreed to proceed. ? ?History of Present Illness: ?66 year old female, former smoker (90 pack year history) followed for severe COPD with emphysema. She is a patient of Dr. Kavin Leech and last seen in office 02/06/2022 by Parrett,NP. Past medical history significant for anxiety, allergic rhinitis, GERD.  ? ?02/06/2021: OV with Parrett,NP. Progressive DOE over the past year - changed to [place] previously. Since last visit, felt like breathing was doing better. Having difficulties affording Breztri - changed to triple therapy nebs. Referred to pulmonary rehab.  ? ?09/24/2021: Today - acute visit ?Patient presents today for worsening shortness of breath and reported walking pneumonia. She has been seen at urgent care twice and once by her PCP over the past week for worsening shortness of breath and cough. Last Wednesday, she was treated with z pack and steroid shot as well as prednisone by her PCP. She was then seen in the urgent care later in the week and treated with breathing treatments. She then went to urgent care Sunday and they started her on doxycycline and provided her with breathing treatments. She was told she had walking pneumonia. Oxygen levels were in low 90's at visit, per patient, but this was at rest. Today, she is having worsening shortness of  breath at rest and notes that her oxygen drops every time she gets up to do something, like go to the bathroom. Lowest she has noticed was 78%. She is planning to have home oxygen delivered, paid for out of pocket. Continues to have a cough that is productive at times and wheezing. She feels very fatigued. She denies hemoptysis, fevers, chest pain. She is on [place] currently. States she never received started the nebulized meds prescribed last time. Using albuterol frequently.  ? ?No Known Allergies ?Immunization History  ?Administered Date(s) Administered  ? Influenza,inj,Quad PF,6+ Mos 05/02/2019  ? PFIZER(Purple Top)SARS-COV-2 Vaccination 09/01/2019, 09/22/2019, 02/25/2020  ? Pneumococcal Polysaccharide-23 08/17/2013, 05/02/2019  ? Unspecified SARS-COV-2 Vaccination 10/09/2019, 10/29/2019  ? ?Past Medical History:  ?Diagnosis Date  ? Anxiety   ? Chronic back pain   ? COPD (chronic obstructive pulmonary disease) (HCC)   ? Tobacco abuse   ? ? ?Tobacco History: ?Social History  ? ?Tobacco Use  ?Smoking Status Former  ? Packs/day: 2.00  ? Years: 45.00  ? Pack years: 90.00  ? Types: Cigarettes  ? Quit date: 04/11/2019  ? Years since quitting: 2.4  ?Smokeless Tobacco Never  ? ?Counseling given: Not Answered ? ? ?Facility-Administered Medications Prior to Visit  ?Medication Dose Route Frequency Provider Last Rate Last Admin  ? budesonide (PULMICORT) nebulizer solution 0.25 mg  0.25 mg Nebulization BID Leslye Peer, MD      ? ?Outpatient Medications Prior to Visit  ?Medication Sig Dispense Refill  ? albuterol (VENTOLIN HFA)  108 (90 Base) MCG/ACT inhaler INHALE 2 PUFFS INTO THE LUNGS EVERY 6 HOURS AS NEEDED FOR WHEEZE/SHORTNESS OF BREATH 36 each 11  ? ALPRAZolam (XANAX) 1 MG tablet Take 0.5-1 mg by mouth at bedtime as needed for sleep or anxiety.    ? Budeson-Glycopyrrol-Formoterol (BREZTRI AEROSPHERE) 160-9-4.8 MCG/ACT AERO Inhale 2 puffs into the lungs 2 (two) times daily. (Patient taking differently: Inhale 2 puffs  into the lungs in the morning.) 10.7 g 5  ? docusate sodium (COLACE) 100 MG capsule Take 1 capsule (100 mg total) by mouth 2 (two) times daily. (Patient taking differently: Take 100 mg by mouth 2 (two) times daily as needed (constipation).) 10 capsule 0  ? fluticasone (FLONASE) 50 MCG/ACT nasal spray Place 1 spray into both nostrils daily as needed for allergies.    ? loratadine (CLARITIN) 10 MG tablet Take 10 mg by mouth daily as needed for allergies.    ? Magnesium 250 MG TABS Take 500 mg by mouth in the morning.    ? montelukast (SINGULAIR) 10 MG tablet Take 10 mg by mouth in the morning.    ? omeprazole (PRILOSEC) 20 MG capsule Take 20 mg by mouth 2 (two) times daily as needed (indigestion/heartburn).    ? arformoterol (BROVANA) 15 MCG/2ML NEBU Take 2 mLs (15 mcg total) by nebulization 2 (two) times daily. (Patient not taking: Reported on 09/24/2021) 120 mL 6  ? Budeson-Glycopyrrol-Formoterol (BREZTRI AEROSPHERE) 160-9-4.8 MCG/ACT AERO Inhale 2 puffs into the lungs in the morning and at bedtime. 5.9 g 0  ? ibuprofen (ADVIL) 200 MG tablet Take 400 mg by mouth every 8 (eight) hours as needed (pain.). (Patient not taking: Reported on 09/24/2021)    ? revefenacin (YUPELRI) 175 MCG/3ML nebulizer solution Take 3 mLs (175 mcg total) by nebulization daily. (Patient not taking: Reported on 09/12/2021) 90 mL 3  ? rosuvastatin (CRESTOR) 5 MG tablet Take 1 tablet (5 mg total) by mouth daily. (Patient not taking: Reported on 09/24/2021) 90 tablet 3  ? SPIRIVA HANDIHALER 18 MCG inhalation capsule INHALE 1 CAPSULE VIA HANDIHALER ONCE DAILY AT THE SAME TIME EVERY DAY 30 capsule 6  ? ?  ?Review of Systems:  ? ?Constitutional: No weight loss or gain, night sweats, fevers, chills. +fatigue, lassitude ?HEENT: No headaches, difficulty swallowing, tooth/dental problems, or sore throat. No sneezing, itching, ear ache, nasal congestion, or post nasal drip ?CV:  No chest pain, orthopnea, PND, swelling in lower extremities, anasarca,  dizziness, palpitations, syncope ?Resp: +shortness of breath at rest; productive cough at times; wheezing.  No hemoptysis. No chest wall deformity ?GI:  No heartburn, indigestion, abdominal pain, nausea, vomiting, diarrhea, change in bowel habits, loss of appetite, bloody stools.  ?GU: No dysuria, change in color of urine, urgency or frequency.  No flank pain, no hematuria  ?Skin: No rash, lesions, ulcerations ?MSK:  No joint pain or swelling.  No decreased range of motion.  No back pain. ?Neuro: No dizziness or lightheadedness.  ?Psych: No depression or anxiety. Mood stable.  ? ?Observations/Objective: ?Acutely-ill appearing. Increased work of breathing with increased RR. O2 was noted to be 91% on room air; however, this was at rest. She is able to formulate a sentence but had to stop every few words to catch her breath.  ? ?Assessment and Plan: ?COPD with acute exacerbation (HCC) ?In respiratory distress during visit with tachypnea and increased work of breathing. Sats stable at rest but notes they are dropping into the 70's-80's with minimal exertion. Advised that she has failed outpatient  treatment at this point and she should go to the ED for further eval/workup. Pt agreed and will call EMS as she has no one to drive her.  ? ?Acute respiratory failure ?Desaturations at home on room air to 70's-80's. Advised she seek further care at ED. ? ? ? ?Follow Up Instructions: Follow up upon hospital discharge.  ? ?  ?I discussed the assessment and treatment plan with the patient. The patient was provided an opportunity to ask questions and all were answered. The patient agreed with the plan and demonstrated an understanding of the instructions. ?  ?The patient was advised to call back or seek an in-person evaluation if the symptoms worsen or if the condition fails to improve as anticipated. ? ?I provided 21 minutes of non-face-to-face time during this encounter. ? ? ?Noemi Chapel, NP  ? ?

## 2021-09-24 NOTE — Telephone Encounter (Signed)
Pt states the last 3 days, she's gone to Urgent Care for breathing treatments, had x ray done, showed pna. Pt has the nebulizer machine at home, but needs the medication sent in. Pt also needing Oxygen. Pt states if she's stioll the O2 stays in the 90's if she moves it goes to low 80's.Please advise. [phone] or [phone] ?

## 2021-09-24 NOTE — ED Notes (Signed)
Date and time results received: 09/24/21 7:21 PM ? ?Test: Lactic Acid ?Critical Value: 2.2 ? ?Name of Provider Notified: Dr. Mariea Clonts ? ?Orders Received? Or Actions Taken?:  ?

## 2021-09-24 NOTE — H&P (Addendum)
?History and Physical  ? ? ?[name] VVZ:[id number] DOB: [birth year] DOA: 09/24/2021 ? ?PCP: Marva Panda, NP  ? ?Patient coming from: Home ? ?I have personally briefly reviewed patient's old medical records in [facility] ? ?Chief Complaint: Difficulty breathing  ? ?HPI: [name] is a 66 y.o. female with medical history significant for COPD, uncontrolled anxiety. ?Patient presented to the ED with complaints of difficulty breathing, cough, wheezing of about 1 week duration.  She also reports wheezing, and fatigue.  She reports O2 sats down to 78% on room air. ?She reports she was diagnosed with pneumonia yesterday, given a shot of antibiotic continue on antibiotics and steroids. ?Despite use of inhalers, breathing continues to worsen.  No leg swelling no chest pain.  Patient has a history of anxiety and panic attacks. ?EMS reports patient had rhonchi, O2 sats in the 80s on arrival to the ED.   At the time of my evaluation patient is on BiPAP. ? ?ED Course: Patient 98.8.  Heart rate 92-139.  Tachypneic with respiratory rate 33-37.  Blood pressure systolic 130s to 544B.  O2 sats 96 to 100% on 4 L.  O2 sats on room air not documented in ED.  Diffuse expiratory wheezing heard in ED. ?Patient was evaluated by RT in the ED and was placed on BiPAP. ?2 view chest x-ray without acute abnormality. ?IV Solu-Medrol, DuoNeb started.  Ativan 1 mg oral tablet given. ? ?Review of Systems: As per HPI all other systems reviewed and negative. ? ?Past Medical History:  ?Diagnosis Date  ? Anxiety   ? Chronic back pain   ? COPD (chronic obstructive pulmonary disease) (HCC)   ? Tobacco abuse   ? ? ?Past Surgical History:  ?Procedure Laterality Date  ? ABDOMINAL HYSTERECTOMY    ? BACK SURGERY    ? ILEO LOOP NEOBLADDER    ? VESICOVAGINAL FISTULA REPAIR    ? ? ? reports that she quit smoking about 2 years ago. Her smoking use included cigarettes. She has a 90.00 pack-year smoking history. She has never used smokeless  tobacco. She reports that she does not drink alcohol and does not use drugs. ? ?No Known Allergies ? ?Family history of hypertension. ? ?Prior to Admission medications   ?Medication Sig Start Date End Date Taking? Authorizing Provider  ?albuterol (VENTOLIN HFA) 108 (90 Base) MCG/ACT inhaler INHALE 2 PUFFS INTO THE LUNGS EVERY 6 HOURS AS NEEDED FOR WHEEZE/SHORTNESS OF BREATH 12/14/20   Leslye Peer, MD  ?albuterol (VENTOLIN HFA) 108 (90 Base) MCG/ACT inhaler albuterol sulfate HFA 90 mcg/actuation aerosol inhaler ? Inhale 2 puffs 3 times a day by inhalation route as directed for 7 days.    [provider]  ?ALPRAZolam Prudy Feeler) 1 MG tablet Take 0.5-1 mg by mouth at bedtime as needed for sleep or anxiety. 03/12/19   [provider]  ?arformoterol (BROVANA) 15 MCG/2ML NEBU Take 2 mLs (15 mcg total) by nebulization 2 (two) times daily. ?Patient not taking: Reported on 09/24/2021 02/06/21   Leslye Peer, MD  ?azithromycin (ZITHROMAX) 250 MG tablet azithromycin 250 mg tablet ? TAKE 2 TABLETS (500 MG) BY ORAL ROUTE ONCE DAILY FOR 1 DAY THEN 1 TABLET (250 MG) BY ORAL ROUTE ONCE DAILY FOR 4 DAYS    [provider]  ?Budeson-Glycopyrrol-Formoterol (BREZTRI AEROSPHERE) 160-9-4.8 MCG/ACT AERO Inhale 2 puffs into the lungs 2 (two) times daily. ?Patient taking differently: Inhale 2 puffs into the lungs in the morning. 12/21/20   Leslye Peer,  MD  ?Budeson-Glycopyrrol-Formoterol (BREZTRI AEROSPHERE) 160-9-4.8 MCG/ACT AERO Inhale 2 puffs into the lungs in the morning and at bedtime. 02/06/21   Parrett, Virgel Bouquetammy S, NP  ?busPIRone (BUSPAR) 10 MG tablet buspirone 10 mg tablet ? Take 1 tablet 3 times a day by oral route as needed.    [provider]  ?cefTRIAXone (ROCEPHIN) 1 g injection ceftriaxone 1 gram solution for injection ? Take 1 g every day by injection route as directed for 1 day.    [provider]  ?docusate sodium (COLACE) 100 MG capsule Take 1 capsule (100 mg total) by mouth 2  (two) times daily. ?Patient taking differently: Take 100 mg by mouth 2 (two) times daily as needed (constipation). 05/02/19   Pollyann SavoyBell, Thomas N, DO  ?doxycycline (VIBRAMYCIN) 100 MG capsule doxycycline hyclate 100 mg capsule ? Take 1 capsule twice a day by oral route as directed for 7 days.    [provider]  ?fluticasone (FLONASE) 50 MCG/ACT nasal spray Place 1 spray into both nostrils daily as needed for allergies.    [provider]  ?ibuprofen (ADVIL) 200 MG tablet Take 400 mg by mouth every 8 (eight) hours as needed (pain.). ?Patient not taking: Reported on 09/24/2021    [provider]  ?ipratropium (ATROVENT HFA) 17 MCG/ACT inhaler Atrovent HFA 17 mcg/actuation aerosol inhaler ? Inhale 2 puffs 3 times a day by inhalation route as directed for 7 days.    [provider]  ?ipratropium (ATROVENT) 0.02 % nebulizer solution ipratropium bromide 0.02 % solution for inhalation ? Inhale 2.5 mL as needed by inhalation route for 1 day.    [provider]  ?levofloxacin (LEVAQUIN) 750 MG tablet Take 750 mg by mouth daily. 09/23/21   [provider]  ?loratadine (CLARITIN) 10 MG tablet Take 10 mg by mouth daily as needed for allergies.    [provider]  ?Magnesium 250 MG TABS Take 500 mg by mouth in the morning.    [provider]  ?montelukast (SINGULAIR) 10 MG tablet Take 10 mg by mouth in the morning.    [provider]  ?omeprazole (PRILOSEC) 20 MG capsule Take 20 mg by mouth 2 (two) times daily as needed (indigestion/heartburn).    [provider]  ?predniSONE (STERAPRED UNI-PAK 21 TAB) 10 MG (21) TBPK tablet Take by mouth. 09/22/21   [provider]  ?revefenacin (YUPELRI) 175 MCG/3ML nebulizer solution Take 3 mLs (175 mcg total) by nebulization daily. ?Patient not taking: Reported on 09/12/2021 02/06/21   Leslye PeerByrum, Robert S, MD  ?rosuvastatin (CRESTOR) 5 MG tablet Take 1 tablet (5 mg total) by mouth daily. ?Patient not taking:  Reported on 09/24/2021 01/22/21 09/13/23  Pricilla Riffleoss, Paula V, MD  ?[facility] 18 MCG inhalation capsule INHALE 1 CAPSULE VIA HANDIHALER ONCE DAILY AT THE SAME TIME EVERY DAY 07/17/20   Parrett, Virgel Bouquetammy S, NP  ?tretinoin (RETIN-A) 0.1 % cream Apply topically at bedtime. 08/17/21   [provider]  ?triamcinolone acetonide (TRIESENCE) 40 MG/ML SUSP triamcinolone acetonide 40 mg/mL suspension for injection ? Take 80 mg every day by injection route for 1 day.    [provider]  ? ? ?Physical Exam: ?Vitals:  ? 09/24/21 1646 09/24/21 1648 09/24/21 1730 09/24/21 1830  ?BP:  (!) 148/84 (!) 130/94 135/84  ?Pulse:  (!) 139 92 (!) 113  ?Resp:  (!) 36 (!) 37 (!) 32  ?Temp:  98.8 ?F (37.1 ?C)    ?TempSrc:  Oral    ?SpO2:  98% 96%  100%  ?Weight: 55.4 kg     ?Height: 5' (1.524 m)     ? ? ?Constitutional: NAD, calm, comfortable ?Vitals:  ? 09/24/21 1646 09/24/21 1648 09/24/21 1730 09/24/21 1830  ?BP:  (!) 148/84 (!) 130/94 135/84  ?Pulse:  (!) 139 92 (!) 113  ?Resp:  (!) 36 (!) 37 (!) 32  ?Temp:  98.8 ?F (37.1 ?C)    ?TempSrc:  Oral    ?SpO2:  98% 96% 100%  ?Weight: 55.4 kg     ?Height: 5' (1.524 m)     ? ?Eyes: PERRL, lids and conjunctivae normal ?ENMT: Mucous membranes are moist.  ?Neck: normal, supple, no masses, no thyromegaly ?Respiratory: On BiPAP, hence limiting auscultation.  She is tachypneic, with increased work of breathing with use of abdominal accessory muscles ?Cardiovascular: Tachycardic, regular rate and rhythm, no murmurs / rubs / gallops. No extremity edema. 2+ pedal pulses.   ?Abdomen: no tenderness, no masses palpated. No hepatosplenomegaly. Bowel sounds positive.  ?Musculoskeletal: no clubbing / cyanosis. No joint deformity upper and lower extremities.  ?Skin: no rashes, lesions, ulcers. No induration ?Neurologic: No apparent abnormality, moving extremities spontaneously.Marland Kitchen  ?Psychiatric: Appears anxious, normal judgment and insight. Alert and oriented x 3.  ? ?Labs on Admission: I have personally  reviewed following labs and imaging studies ? ?CBC: ?Recent Labs  ?Lab 09/24/21 ?1707  ?WBC 12.0*  ?NEUTROABS 9.9*  ?HGB 14.8  ?HCT 43.9  ?MCV 96.1  ?PLT 162  ? ?Basic Metabolic Panel: ?Recent Labs  ?Lab 04/1

## 2021-09-24 NOTE — Assessment & Plan Note (Addendum)
-   Continue clonazepam and Seroquel

## 2021-09-24 NOTE — Assessment & Plan Note (Signed)
In respiratory distress during visit with tachypnea and increased work of breathing. Sats stable at rest but notes they are dropping into the 70's-80's with minimal exertion. Advised that she has failed outpatient treatment at this point and she should go to the ED for further eval/workup. Pt agreed and will call EMS as she has no one to drive her.  ?

## 2021-09-25 ENCOUNTER — Encounter (HOSPITAL_COMMUNITY): Payer: Medicare Other

## 2021-09-25 DIAGNOSIS — D72829 Elevated white blood cell count, unspecified: Secondary | ICD-10-CM | POA: Diagnosis not present

## 2021-09-25 DIAGNOSIS — J439 Emphysema, unspecified: Secondary | ICD-10-CM | POA: Diagnosis present

## 2021-09-25 DIAGNOSIS — F419 Anxiety disorder, unspecified: Secondary | ICD-10-CM | POA: Diagnosis not present

## 2021-09-25 DIAGNOSIS — E43 Unspecified severe protein-calorie malnutrition: Secondary | ICD-10-CM | POA: Diagnosis present

## 2021-09-25 DIAGNOSIS — R1310 Dysphagia, unspecified: Secondary | ICD-10-CM | POA: Diagnosis not present

## 2021-09-25 DIAGNOSIS — Z20822 Contact with and (suspected) exposure to covid-19: Secondary | ICD-10-CM | POA: Diagnosis present

## 2021-09-25 DIAGNOSIS — R918 Other nonspecific abnormal finding of lung field: Secondary | ICD-10-CM | POA: Diagnosis not present

## 2021-09-25 DIAGNOSIS — R5381 Other malaise: Secondary | ICD-10-CM | POA: Diagnosis not present

## 2021-09-25 DIAGNOSIS — I471 Supraventricular tachycardia: Secondary | ICD-10-CM | POA: Diagnosis not present

## 2021-09-25 DIAGNOSIS — J9622 Acute and chronic respiratory failure with hypercapnia: Secondary | ICD-10-CM | POA: Diagnosis present

## 2021-09-25 DIAGNOSIS — N390 Urinary tract infection, site not specified: Secondary | ICD-10-CM | POA: Diagnosis not present

## 2021-09-25 DIAGNOSIS — K567 Ileus, unspecified: Secondary | ICD-10-CM | POA: Diagnosis not present

## 2021-09-25 DIAGNOSIS — L89152 Pressure ulcer of sacral region, stage 2: Secondary | ICD-10-CM | POA: Diagnosis not present

## 2021-09-25 DIAGNOSIS — K560 Paralytic ileus: Secondary | ICD-10-CM | POA: Diagnosis not present

## 2021-09-25 DIAGNOSIS — I5022 Chronic systolic (congestive) heart failure: Secondary | ICD-10-CM | POA: Diagnosis not present

## 2021-09-25 DIAGNOSIS — J441 Chronic obstructive pulmonary disease with (acute) exacerbation: Secondary | ICD-10-CM | POA: Diagnosis not present

## 2021-09-25 DIAGNOSIS — R739 Hyperglycemia, unspecified: Secondary | ICD-10-CM | POA: Diagnosis not present

## 2021-09-25 DIAGNOSIS — R8271 Bacteriuria: Secondary | ICD-10-CM | POA: Diagnosis not present

## 2021-09-25 DIAGNOSIS — E876 Hypokalemia: Secondary | ICD-10-CM | POA: Diagnosis not present

## 2021-09-25 DIAGNOSIS — K219 Gastro-esophageal reflux disease without esophagitis: Secondary | ICD-10-CM

## 2021-09-25 DIAGNOSIS — Y95 Nosocomial condition: Secondary | ICD-10-CM | POA: Diagnosis not present

## 2021-09-25 DIAGNOSIS — I5041 Acute combined systolic (congestive) and diastolic (congestive) heart failure: Secondary | ICD-10-CM | POA: Diagnosis not present

## 2021-09-25 DIAGNOSIS — J189 Pneumonia, unspecified organism: Secondary | ICD-10-CM | POA: Diagnosis not present

## 2021-09-25 DIAGNOSIS — K56600 Partial intestinal obstruction, unspecified as to cause: Secondary | ICD-10-CM | POA: Diagnosis not present

## 2021-09-25 DIAGNOSIS — Z9911 Dependence on respirator [ventilator] status: Secondary | ICD-10-CM | POA: Diagnosis not present

## 2021-09-25 DIAGNOSIS — I110 Hypertensive heart disease with heart failure: Secondary | ICD-10-CM | POA: Diagnosis present

## 2021-09-25 DIAGNOSIS — E872 Acidosis, unspecified: Secondary | ICD-10-CM | POA: Diagnosis present

## 2021-09-25 DIAGNOSIS — R0602 Shortness of breath: Secondary | ICD-10-CM | POA: Diagnosis present

## 2021-09-25 DIAGNOSIS — F411 Generalized anxiety disorder: Secondary | ICD-10-CM | POA: Diagnosis not present

## 2021-09-25 DIAGNOSIS — R0603 Acute respiratory distress: Secondary | ICD-10-CM

## 2021-09-25 DIAGNOSIS — J9602 Acute respiratory failure with hypercapnia: Secondary | ICD-10-CM | POA: Diagnosis not present

## 2021-09-25 DIAGNOSIS — G9341 Metabolic encephalopathy: Secondary | ICD-10-CM | POA: Diagnosis not present

## 2021-09-25 DIAGNOSIS — I255 Ischemic cardiomyopathy: Secondary | ICD-10-CM | POA: Diagnosis not present

## 2021-09-25 DIAGNOSIS — E871 Hypo-osmolality and hyponatremia: Secondary | ICD-10-CM | POA: Diagnosis not present

## 2021-09-25 DIAGNOSIS — E870 Hyperosmolality and hypernatremia: Secondary | ICD-10-CM | POA: Diagnosis not present

## 2021-09-25 DIAGNOSIS — G9340 Encephalopathy, unspecified: Secondary | ICD-10-CM | POA: Diagnosis not present

## 2021-09-25 DIAGNOSIS — I429 Cardiomyopathy, unspecified: Secondary | ICD-10-CM | POA: Diagnosis present

## 2021-09-25 DIAGNOSIS — I5030 Unspecified diastolic (congestive) heart failure: Secondary | ICD-10-CM | POA: Diagnosis not present

## 2021-09-25 DIAGNOSIS — I5043 Acute on chronic combined systolic (congestive) and diastolic (congestive) heart failure: Secondary | ICD-10-CM | POA: Diagnosis present

## 2021-09-25 DIAGNOSIS — J9601 Acute respiratory failure with hypoxia: Secondary | ICD-10-CM | POA: Diagnosis not present

## 2021-09-25 DIAGNOSIS — Z930 Tracheostomy status: Secondary | ICD-10-CM | POA: Diagnosis not present

## 2021-09-25 DIAGNOSIS — J150 Pneumonia due to Klebsiella pneumoniae: Secondary | ICD-10-CM | POA: Diagnosis not present

## 2021-09-25 DIAGNOSIS — J9621 Acute and chronic respiratory failure with hypoxia: Secondary | ICD-10-CM | POA: Diagnosis present

## 2021-09-25 DIAGNOSIS — I10 Essential (primary) hypertension: Secondary | ICD-10-CM | POA: Diagnosis not present

## 2021-09-25 DIAGNOSIS — I5021 Acute systolic (congestive) heart failure: Secondary | ICD-10-CM | POA: Diagnosis not present

## 2021-09-25 DIAGNOSIS — G6281 Critical illness polyneuropathy: Secondary | ICD-10-CM | POA: Diagnosis not present

## 2021-09-25 DIAGNOSIS — D696 Thrombocytopenia, unspecified: Secondary | ICD-10-CM | POA: Diagnosis present

## 2021-09-25 DIAGNOSIS — R070 Pain in throat: Secondary | ICD-10-CM | POA: Diagnosis not present

## 2021-09-25 DIAGNOSIS — R000 Tachycardia, unspecified: Secondary | ICD-10-CM | POA: Diagnosis not present

## 2021-09-25 DIAGNOSIS — K7200 Acute and subacute hepatic failure without coma: Secondary | ICD-10-CM | POA: Diagnosis not present

## 2021-09-25 DIAGNOSIS — J392 Other diseases of pharynx: Secondary | ICD-10-CM | POA: Diagnosis not present

## 2021-09-25 DIAGNOSIS — I5023 Acute on chronic systolic (congestive) heart failure: Secondary | ICD-10-CM | POA: Diagnosis not present

## 2021-09-25 DIAGNOSIS — R578 Other shock: Secondary | ICD-10-CM | POA: Diagnosis not present

## 2021-09-25 LAB — HIV ANTIBODY (ROUTINE TESTING W REFLEX): HIV Screen 4th Generation wRfx: NONREACTIVE

## 2021-09-25 LAB — BASIC METABOLIC PANEL
Anion gap: 8 (ref 5–15)
BUN: 27 mg/dL — ABNORMAL HIGH (ref 8–23)
CO2: 26 mmol/L (ref 22–32)
Calcium: 9 mg/dL (ref 8.9–10.3)
Chloride: 108 mmol/L (ref 98–111)
Creatinine, Ser: 0.83 mg/dL (ref 0.44–1.00)
GFR, Estimated: >60 mL/min (ref >60)
Glucose, Bld: 143 mg/dL — ABNORMAL HIGH (ref 70–99)
Potassium: 3.7 mmol/L (ref 3.5–5.1)
Sodium: 142 mmol/L (ref 135–145)

## 2021-09-25 LAB — CBC
HCT: 38 % (ref 36.0–46.0)
Hemoglobin: 12.6 g/dL (ref 12.0–15.0)
MCH: 32.5 pg (ref 26.0–34.0)
MCHC: 33.2 g/dL (ref 30.0–36.0)
MCV: 97.9 fL (ref 80.0–100.0)
Platelets: 133 10*3/uL — ABNORMAL LOW (ref 150–400)
RBC: 3.88 MIL/uL (ref 3.87–5.11)
RDW: 13.3 % (ref 11.5–15.5)
WBC: 6.8 10*3/uL (ref 4.0–10.5)
nRBC: 0 % (ref 0.0–0.2)

## 2021-09-25 LAB — MRSA NEXT GEN BY PCR, NASAL: MRSA by PCR Next Gen: NOT DETECTED

## 2021-09-25 LAB — HEMOGLOBIN A1C
Hgb A1c MFr Bld: 5.4 % (ref 4.8–5.6)
Mean Plasma Glucose: 108.28 mg/dL

## 2021-09-25 MED ORDER — LORAZEPAM 2 MG/ML IJ SOLN
1.0000 mg | INTRAMUSCULAR | Status: DC | PRN
  Administered 2021-09-25 – 2021-09-28 (×5): 1 mg via INTRAVENOUS
  Filled 2021-09-25 (×5): qty 1

## 2021-09-25 MED ORDER — POTASSIUM CHLORIDE 10 MEQ/100ML IV SOLN
10.0000 meq | INTRAVENOUS | Status: AC
  Administered 2021-09-25 (×3): 10 meq via INTRAVENOUS
  Filled 2021-09-25 (×3): qty 100

## 2021-09-25 MED ORDER — CHLORHEXIDINE GLUCONATE 0.12 % MT SOLN
15.0000 mL | Freq: Two times a day (BID) | OROMUCOSAL | Status: DC
  Administered 2021-09-25 – 2021-10-02 (×16): 15 mL via OROMUCOSAL
  Filled 2021-09-25 (×14): qty 15

## 2021-09-25 MED ORDER — BUDESONIDE 0.5 MG/2ML IN SUSP
0.5000 mg | Freq: Two times a day (BID) | RESPIRATORY_TRACT | Status: DC
  Administered 2021-09-25 – 2021-11-14 (×99): 0.5 mg via RESPIRATORY_TRACT
  Filled 2021-09-25 (×100): qty 2

## 2021-09-25 MED ORDER — MORPHINE SULFATE (PF) 2 MG/ML IV SOLN
2.0000 mg | Freq: Once | INTRAVENOUS | Status: AC
  Administered 2021-09-25: 2 mg via INTRAVENOUS
  Filled 2021-09-25: qty 1

## 2021-09-25 MED ORDER — CHLORHEXIDINE GLUCONATE CLOTH 2 % EX PADS
6.0000 | MEDICATED_PAD | Freq: Every day | CUTANEOUS | Status: DC
  Administered 2021-09-25 – 2021-10-14 (×20): 6 via TOPICAL

## 2021-09-25 MED ORDER — PANTOPRAZOLE SODIUM 40 MG PO TBEC
40.0000 mg | DELAYED_RELEASE_TABLET | Freq: Two times a day (BID) | ORAL | Status: DC
Start: 2021-09-25 — End: 2021-09-26
  Administered 2021-09-25 – 2021-09-26 (×3): 40 mg via ORAL
  Filled 2021-09-25 (×3): qty 1

## 2021-09-25 MED ORDER — GUAIFENESIN-DM 100-10 MG/5ML PO SYRP
5.0000 mL | ORAL_SOLUTION | ORAL | Status: DC | PRN
  Administered 2021-09-26 – 2021-09-27 (×2): 5 mL via ORAL
  Filled 2021-09-25 (×3): qty 5

## 2021-09-25 MED ORDER — BUSPIRONE HCL 10 MG PO TABS
10.0000 mg | ORAL_TABLET | Freq: Three times a day (TID) | ORAL | Status: DC
  Administered 2021-09-25 – 2021-09-29 (×12): 10 mg via ORAL
  Filled 2021-09-25 (×10): qty 2
  Filled 2021-09-25 (×2): qty 1
  Filled 2021-09-25: qty 2

## 2021-09-25 MED ORDER — ARFORMOTEROL TARTRATE 15 MCG/2ML IN NEBU
15.0000 ug | INHALATION_SOLUTION | Freq: Two times a day (BID) | RESPIRATORY_TRACT | Status: DC
  Administered 2021-09-25 – 2021-11-14 (×99): 15 ug via RESPIRATORY_TRACT
  Filled 2021-09-25 (×102): qty 2

## 2021-09-25 MED ORDER — DM-GUAIFENESIN ER 30-600 MG PO TB12
1.0000 | ORAL_TABLET | Freq: Two times a day (BID) | ORAL | Status: DC
  Administered 2021-09-25 – 2021-09-26 (×3): 1 via ORAL
  Filled 2021-09-25 (×3): qty 1

## 2021-09-25 MED ORDER — ORAL CARE MOUTH RINSE
15.0000 mL | Freq: Two times a day (BID) | OROMUCOSAL | Status: DC
  Administered 2021-09-25 – 2021-09-27 (×5): 15 mL via OROMUCOSAL

## 2021-09-25 NOTE — Progress Notes (Signed)
Had just left patient's room after giving her a breathing treatment and discussing ways to help her anxiety of being on Bipap.  Patient's nephew came back into room and patient took off Bipap mask trying to talk to nephew.  Patient became extremely SOB, sats dropped into 70s.  Placed patient back on [place] 5L till I could repair her mask and placed her back on Bipap.  Got patient to lay back down and relax and sats came back up to 98%.  Nephew is at bedside trying to keep patient relaxed. ?

## 2021-09-25 NOTE — ED Notes (Signed)
Checked on pt. ?Pt purwick in place ?Pt sideways in bed sleeping- RN notified ?

## 2021-09-25 NOTE — Assessment & Plan Note (Addendum)
-   secondary to the use of his steroids ?-No prior history of diabetes ?-4/18 check A1c--5.4 ?Add novolog q 4 hours ?

## 2021-09-25 NOTE — Progress Notes (Signed)
?Progress Note ? ? ?Patient: [name] [id number] DOB: 15-Aug-1955 DOA: 09/24/2021     0 ?DOS: the patient was seen and examined on 09/25/2021 ?  ?Brief hospital course: ?As per H&P written by Dr. Denton Brick on 09/24/2021 ?[name] is a 66 y.o. female with medical history significant for COPD, uncontrolled anxiety. ?Patient presented to the ED with complaints of difficulty breathing, cough, wheezing of about 1 week duration.  She also reports wheezing, and fatigue.  She reports O2 sats down to 78% on room air. ?She reports she was diagnosed with pneumonia yesterday, given a shot of antibiotic continue on antibiotics and steroids. ?Despite use of inhalers, breathing continues to worsen.  No leg swelling no chest pain.  Patient has a history of anxiety and panic attacks. ?EMS reports patient had rhonchi, O2 sats in the 80s on arrival to the ED.   At the time of my evaluation patient is on BiPAP. ?  ?ED Course: Patient 98.8.  Heart rate 92-139.  Tachypneic with respiratory rate 33-37.  Blood pressure systolic Q000111Q to Q000111Q.  O2 sats 96 to 100% on 4 L.  O2 sats on room air not documented in ED.  Diffuse expiratory wheezing heard in ED. ?Patient was evaluated by RT in the ED and was placed on BiPAP. ?2 view chest x-ray without acute abnormality. ?IV Solu-Medrol, DuoNeb started.  Ativan 1 mg oral tablet given. ?  ?Assessment and Plan: ?* COPD with acute exacerbation ([place]) ?-Continue to demonstrate tachypnea and increased work of breathing; currently weaning herself off BiPAP and using 5-6 L nasal cannula supplementation. ?-Plan is to keep oxygen saturation 90-92%; weaning it off as tolerated. ?-Continue the use of his steroids, Pulmicort, Brovana and DuoNebs. ?-Patient with complaining of anxiety and for that we will resume BuSpar 3 times a day along with as needed Ativan. ?-Continue to follow clinical response. ?-Chest x-ray without definitive infiltrates; receiving Zithromax for bronchitis/bronchiectasis  component. ?-PPI twice a day has been ordered. ? ?Hyperglycemia ?-Most likely secondary to the use of his steroids ?-No prior history of diabetes ?-Will check A1c ?-Continue to follow CBGs fluctuation. ? ?GERD (gastroesophageal reflux disease) ?- Continue PPI. ? ?Anxiety ?-Large anxiety component contributing to her dyspnea today.  ?-Continue adjusted dose of Ativan and resume the use of BuSpar. ?-Long discussion with patient about coping mechanisms. ? ?Hypokalemia ?-Continue to follow ultralights trend and further replete as needed. ?-Potassium currently 3.7. ? ? ?GERD ?-continue PPI ? ?Subjective:  ?Afebrile, no chest pain, complaining of intermittent nonproductive coughing spells and short winded sensation.  Patient unable to speak in full sentences at time of my evaluation demonstrating tachypnea and increased work of breathing.  She just came off of BiPAP. ? ? ?Physical Exam: ?Vitals:  ? 09/25/21 0530 09/25/21 0600 09/25/21 0630 09/25/21 0700  ?BP: 113/80 139/86 133/70 131/71  ?Pulse: 77 94 85 85  ?Resp: (!) 25 (!) 32 (!) 28 (!) 26  ?Temp:      ?TempSrc:      ?SpO2: 100% 100% 99% 98%  ?Weight:      ?Height:      ? ?General exam: Alert, oriented x3 and following commands appropriately.  Anxious, with tachypnea, demonstrate increased work of breathing.  No fever.  No chest pain. ?Respiratory system: Positive tachypnea, positive bilateral rhonchi and expiratory wheezing. ?Cardiovascular system:RRR. No murmurs, rubs, gallops.  No JVD. ?Gastrointestinal system: Abdomen is nondistended, soft and nontender. No organomegaly or masses felt. Normal bowel sounds heard. ?Central nervous system: Alert and  oriented. No focal neurological deficits. ?Extremities: No cyanosis or clubbing. ?Skin: No petechiae. ?Psychiatry: Judgement and insight appear normal. Mood & affect appropriate.  ? ?Data Reviewed: ?HIV screen fourth-generation nonreactive ?Basic metabolic panel with sodium 142, Potassium 3.7, Glucose 143, BUN 27 and  creatinine 0.83. ?CBC demonstrating a WBCs of 6.8, hemoglobin 12.6 and platelet count 133,000. ? ?Family Communication: Sister at bedside. ? ?Disposition: ?Status is: Inpatient. ? ? Planned Discharge Destination: Home ? ? ?Author: ?Barton Dubois, MD ?09/25/2021 8:07 AM ? ?For on call review [email or web address].  ?

## 2021-09-25 NOTE — TOC Progression Note (Signed)
Transition of Care (TOC) - Progression Note  ? ? ?Patient Details  ?Name: [name] ?MRN: [id number] ?Date of Birth: March 02, 1956 ? ?Transition of Care (TOC) CM/SW Contact  ?Salome Arnt, LCSW ?Phone Number: ?09/25/2021, 10:32 AM ? ?Clinical Narrative:   ?Transition of Care (TOC) Screening Note ? ? ?Patient Details  ?Name: [name] ?Date of Birth: 11-06-1955 ? ? ?Transition of Care (TOC) CM/SW Contact:    ?Salome Arnt, LCSW ?Phone Number: ?09/25/2021, 10:33 AM ? ? ? ?Transition of Care Department [facility]) has reviewed patient and no TOC needs have been identified at this time. We will continue to monitor patient advancement through interdisciplinary progression rounds. If new patient transition needs arise, please place a TOC consult. ?    ? ? ? ?  ?Barriers to Discharge: Continued Medical Work up ? ?Expected Discharge Plan and Services ?  ?  ?  ?  ?  ?                ?  ?  ?  ?  ?  ?  ?  ?  ?  ?  ? ? ?Social Determinants of Health (SDOH) Interventions ?  ? ?Readmission Risk Interventions ?   ? View : No data to display.  ?  ?  ?  ? ? ?

## 2021-09-25 NOTE — Progress Notes (Signed)
Patient came off Bipap for a short while to take meds.  Placed patient back on after she got some Ativan to help her relax.  Will continue to monitor. ?

## 2021-09-25 NOTE — Assessment & Plan Note (Addendum)
Resolved

## 2021-09-25 NOTE — ED Notes (Signed)
Went to room d/t bipap alarming. Mask off pt, pt 70% on RA. Pt noticably anxious, placed on canula while this RN fixed bipap mask. Bipap reapplied, RT called. Dr. Herma Carson notified of pt's increased anxiety, see MAR.  ?

## 2021-09-25 NOTE — Assessment & Plan Note (Addendum)
Continue PPI ?

## 2021-09-25 NOTE — Progress Notes (Signed)
Upon entering room patient had increased WOB, had been taken off of BIPAP by RN, and placed on 5L nasal cannula. RT suggested to patient for her to go back on BIPAP to help with her SOB but patient declined stating she did not want to go back on at this time. Patient stated she wanted a higher dose of ativan. RT relayed patients request to patients RN. RN aware. Will continue to monitor as needed. ?

## 2021-09-26 ENCOUNTER — Inpatient Hospital Stay (HOSPITAL_COMMUNITY): Payer: Medicare Other

## 2021-09-26 DIAGNOSIS — E872 Acidosis, unspecified: Secondary | ICD-10-CM | POA: Diagnosis not present

## 2021-09-26 DIAGNOSIS — J9602 Acute respiratory failure with hypercapnia: Secondary | ICD-10-CM | POA: Diagnosis not present

## 2021-09-26 DIAGNOSIS — R579 Shock, unspecified: Secondary | ICD-10-CM | POA: Diagnosis not present

## 2021-09-26 DIAGNOSIS — J9601 Acute respiratory failure with hypoxia: Secondary | ICD-10-CM

## 2021-09-26 DIAGNOSIS — J441 Chronic obstructive pulmonary disease with (acute) exacerbation: Secondary | ICD-10-CM | POA: Diagnosis not present

## 2021-09-26 DIAGNOSIS — E876 Hypokalemia: Secondary | ICD-10-CM | POA: Diagnosis not present

## 2021-09-26 LAB — BLOOD GAS, ARTERIAL
Acid-base deficit: 1.3 mmol/L (ref 0.0–2.0)
Acid-base deficit: 5.4 mmol/L — ABNORMAL HIGH (ref 0.0–2.0)
Acid-base deficit: 3.2 mmol/L — ABNORMAL HIGH (ref 0.0–2.0)
Bicarbonate: 31.9 mmol/L — ABNORMAL HIGH (ref 20.0–28.0)
Bicarbonate: 23.8 mmol/L (ref 20.0–28.0)
Bicarbonate: 25.3 mmol/L (ref 20.0–28.0)
Drawn by: 41977
Drawn by: 41977
Drawn by: 41977
FIO2: 70 %
FIO2: 50 %
FIO2: 50 %
O2 Saturation: 98.8 %
O2 Saturation: 99.2 %
O2 Saturation: 97.4 %
Patient temperature: 37
Patient temperature: 36.8
Patient temperature: 37
pCO2 arterial: 105 mmHg (ref 32–48)
pCO2 arterial: 60 mmHg — ABNORMAL HIGH (ref 32–48)
pCO2 arterial: 59 mmHg — ABNORMAL HIGH (ref 32–48)
pH, Arterial: 7.09 — CL (ref 7.35–7.45)
pH, Arterial: 7.2 — ABNORMAL LOW (ref 7.35–7.45)
pH, Arterial: 7.24 — ABNORMAL LOW (ref 7.35–7.45)
pO2, Arterial: 232 mmHg — ABNORMAL HIGH (ref 83–108)
pO2, Arterial: 128 mmHg — ABNORMAL HIGH (ref 83–108)
pO2, Arterial: 89 mmHg (ref 83–108)

## 2021-09-26 LAB — CBC
HCT: 42.7 % (ref 36.0–46.0)
Hemoglobin: 13.3 g/dL (ref 12.0–15.0)
MCH: 31.8 pg (ref 26.0–34.0)
MCHC: 31.1 g/dL (ref 30.0–36.0)
MCV: 102.2 fL — ABNORMAL HIGH (ref 80.0–100.0)
Platelets: 150 10*3/uL (ref 150–400)
RBC: 4.18 MIL/uL (ref 3.87–5.11)
RDW: 13.4 % (ref 11.5–15.5)
WBC: 8.7 10*3/uL (ref 4.0–10.5)
nRBC: 0 % (ref 0.0–0.2)

## 2021-09-26 LAB — PHOSPHORUS: Phosphorus: 6 mg/dL — ABNORMAL HIGH (ref 2.5–4.6)

## 2021-09-26 LAB — BASIC METABOLIC PANEL
Anion gap: 7 (ref 5–15)
BUN: 35 mg/dL — ABNORMAL HIGH (ref 8–23)
CO2: 29 mmol/L (ref 22–32)
Calcium: 9.8 mg/dL (ref 8.9–10.3)
Chloride: 111 mmol/L (ref 98–111)
Creatinine, Ser: 0.93 mg/dL (ref 0.44–1.00)
GFR, Estimated: >60 mL/min (ref >60)
Glucose, Bld: 162 mg/dL — ABNORMAL HIGH (ref 70–99)
Potassium: 4.9 mmol/L (ref 3.5–5.1)
Sodium: 147 mmol/L — ABNORMAL HIGH (ref 135–145)

## 2021-09-26 LAB — GLUCOSE, CAPILLARY
Glucose-Capillary: 134 mg/dL — ABNORMAL HIGH (ref 70–99)
Glucose-Capillary: 138 mg/dL — ABNORMAL HIGH (ref 70–99)

## 2021-09-26 LAB — LACTIC ACID, PLASMA: Lactic Acid, Venous: 2.4 mmol/L (ref 0.5–1.9)

## 2021-09-26 MED ORDER — PANTOPRAZOLE SODIUM 40 MG IV SOLR
40.0000 mg | Freq: Two times a day (BID) | INTRAVENOUS | Status: DC
  Administered 2021-09-26 – 2021-09-29 (×6): 40 mg via INTRAVENOUS
  Filled 2021-09-26 (×6): qty 10

## 2021-09-26 MED ORDER — METHYLPREDNISOLONE SODIUM SUCC 125 MG IJ SOLR
80.0000 mg | Freq: Three times a day (TID) | INTRAMUSCULAR | Status: DC
  Administered 2021-09-26 – 2021-09-30 (×12): 80 mg via INTRAVENOUS
  Filled 2021-09-26 (×12): qty 2

## 2021-09-26 MED ORDER — OSMOLITE 1.2 CAL PO LIQD
1000.0000 mL | ORAL | Status: DC
  Administered 2021-09-27 – 2021-10-19 (×21): 1000 mL
  Filled 2021-09-26 (×23): qty 1000

## 2021-09-26 MED ORDER — REVEFENACIN 175 MCG/3ML IN SOLN: 175.0000 ug | Freq: Every day | RESPIRATORY_TRACT | Status: DC

## 2021-09-26 MED ORDER — MORPHINE SULFATE (PF) 4 MG/ML IV SOLN
4.0000 mg | Freq: Once | INTRAVENOUS | Status: AC
  Administered 2021-09-26: 4 mg via INTRAVENOUS
  Filled 2021-09-26: qty 1

## 2021-09-26 MED ORDER — PROPOFOL 10 MG/ML IV BOLUS
INTRAVENOUS | Status: AC
  Filled 2021-09-26: qty 20

## 2021-09-26 MED ORDER — POLYETHYLENE GLYCOL 3350 17 G PO PACK
17.0000 g | PACK | Freq: Every day | ORAL | Status: DC
  Administered 2021-09-26 – 2021-09-29 (×4): 17 g
  Filled 2021-09-26 (×5): qty 1

## 2021-09-26 MED ORDER — MORPHINE SULFATE (PF) 2 MG/ML IV SOLN
2.0000 mg | Freq: Once | INTRAVENOUS | Status: AC
  Administered 2021-09-26: 2 mg via INTRAVENOUS
  Filled 2021-09-26: qty 1

## 2021-09-26 MED ORDER — PROSOURCE TF PO LIQD
45.0000 mL | Freq: Every day | ORAL | Status: DC
  Administered 2021-09-26 – 2021-10-19 (×24): 45 mL
  Filled 2021-09-26 (×24): qty 45

## 2021-09-26 MED ORDER — SUCCINYLCHOLINE CHLORIDE 200 MG/10ML IV SOSY
PREFILLED_SYRINGE | INTRAVENOUS | Status: AC
  Administered 2021-09-26: 100 mg
  Filled 2021-09-26: qty 10

## 2021-09-26 MED ORDER — METHYLPREDNISOLONE SODIUM SUCC 125 MG IJ SOLR: 60.0000 mg | Freq: Two times a day (BID) | INTRAMUSCULAR | Status: DC

## 2021-09-26 MED ORDER — FREE WATER
100.0000 mL | Freq: Four times a day (QID) | Status: DC
  Administered 2021-09-26 – 2021-09-27 (×3): 100 mL

## 2021-09-26 MED ORDER — PROPOFOL 1000 MG/100ML IV EMUL
INTRAVENOUS | Status: AC
  Filled 2021-09-26: qty 100

## 2021-09-26 MED ORDER — REVEFENACIN 175 MCG/3ML IN SOLN
175.0000 ug | Freq: Every day | RESPIRATORY_TRACT | Status: DC
  Administered 2021-09-26 – 2021-11-14 (×45): 175 ug via RESPIRATORY_TRACT
  Filled 2021-09-26 (×44): qty 3

## 2021-09-26 MED ORDER — LEVALBUTEROL HCL 0.63 MG/3ML IN NEBU
0.6300 mg | INHALATION_SOLUTION | RESPIRATORY_TRACT | Status: DC | PRN
  Administered 2021-09-27 – 2021-11-13 (×16): 0.63 mg via RESPIRATORY_TRACT
  Filled 2021-09-26 (×16): qty 3

## 2021-09-26 MED ORDER — ETOMIDATE 2 MG/ML IV SOLN
INTRAVENOUS | Status: AC
  Administered 2021-09-26: 100 mg
  Filled 2021-09-26: qty 20

## 2021-09-26 MED ORDER — PROPOFOL 1000 MG/100ML IV EMUL
0.0000 ug/kg/min | INTRAVENOUS | Status: DC
  Administered 2021-09-26: 5 ug/kg/min via INTRAVENOUS

## 2021-09-26 MED ORDER — FENTANYL 2500MCG IN NS 250ML (10MCG/ML) PREMIX INFUSION
0.0000 ug/h | INTRAVENOUS | Status: DC
  Administered 2021-09-26: 100 ug/h via INTRAVENOUS
  Administered 2021-09-27: 50 ug/h via INTRAVENOUS
  Administered 2021-09-28 (×2): 75 ug/h via INTRAVENOUS
  Filled 2021-09-26 (×3): qty 250

## 2021-09-26 MED ORDER — IPRATROPIUM BROMIDE 0.02 % IN SOLN
0.5000 mg | Freq: Four times a day (QID) | RESPIRATORY_TRACT | Status: DC
  Administered 2021-09-26: 0.5 mg via RESPIRATORY_TRACT
  Filled 2021-09-26: qty 2.5

## 2021-09-26 MED ORDER — SODIUM CHLORIDE 0.9 % IV SOLN: INTRAVENOUS | Status: DC

## 2021-09-26 MED ORDER — MIDAZOLAM-SODIUM CHLORIDE 100-0.9 MG/100ML-% IV SOLN
0.0000 mg/h | INTRAVENOUS | Status: DC
  Administered 2021-09-26: 1 mg/h via INTRAVENOUS
  Administered 2021-09-27 – 2021-09-28 (×2): 5 mg/h via INTRAVENOUS
  Administered 2021-09-28: 8 mg/h via INTRAVENOUS
  Administered 2021-09-30: 4 mg/h via INTRAVENOUS
  Administered 2021-10-01: 3 mg/h via INTRAVENOUS
  Filled 2021-09-26 (×6): qty 100

## 2021-09-26 MED ORDER — DOCUSATE SODIUM 50 MG/5ML PO LIQD
100.0000 mg | Freq: Two times a day (BID) | ORAL | Status: DC
  Administered 2021-09-26 – 2021-09-29 (×8): 100 mg
  Filled 2021-09-26 (×9): qty 10

## 2021-09-26 MED ORDER — METHYLPREDNISOLONE SODIUM SUCC 125 MG IJ SOLR
125.0000 mg | Freq: Once | INTRAMUSCULAR | Status: AC
  Administered 2021-09-26: 125 mg via INTRAVENOUS
  Filled 2021-09-26: qty 2

## 2021-09-26 MED ORDER — SODIUM CHLORIDE 0.9 % IV BOLUS
1000.0000 mL | Freq: Once | INTRAVENOUS | Status: AC
  Administered 2021-09-26: 1000 mL via INTRAVENOUS

## 2021-09-26 MED ORDER — ROCURONIUM BROMIDE 10 MG/ML (PF) SYRINGE
PREFILLED_SYRINGE | INTRAVENOUS | Status: AC
  Filled 2021-09-26: qty 10

## 2021-09-26 MED ORDER — LEVALBUTEROL HCL 0.63 MG/3ML IN NEBU
0.6300 mg | INHALATION_SOLUTION | Freq: Four times a day (QID) | RESPIRATORY_TRACT | Status: DC
  Administered 2021-09-26: 0.63 mg via RESPIRATORY_TRACT
  Filled 2021-09-26: qty 3

## 2021-09-26 NOTE — Assessment & Plan Note (Signed)
Driven primarily from her hypoxia and respiratory failure ?Sepsis ruled out ?

## 2021-09-26 NOTE — Consult Note (Signed)
? ?NAME:  [name], MRN:  [id number], DOB:  1955-07-28, LOS: 1 ?ADMISSION DATE:  09/24/2021, CONSULTATION DATE:  4/18 ?REFERRING MD:  Tat/ triad, CHIEF COMPLAINT:  resp distress   ? ?History of Present Illness:  ?66 y.o. female quit smoking 2021 with  GOLD 3 COPD MZ, uncontrolled anxiety. Was on 02 one year PTA but "took herself off" per friend at bedside and able to care for pets at home but mostly housebound sinc ethen ? ?Patient presented to the ED with complaints of difficulty breathing, cough, wheezing x 1 week duration.  She also reported wheezing, and fatigue with O2 sats down to 78% on room air. ?She reported she was diagnosed with pneumonia one day PTA and given a shot of antibiotic continue on antibiotics and steroids. ?  ?EMS reported patient had rhonchi, O2 sats in the 80s on arrival to the ED.     ?  ?ED Course: Patient 98.8.  Heart rate 92-139.  Tachypneic with respiratory rate 33-37.  Blood pressure systolic 130s to 749S.  O2 sats 96 to 100% on 4 L.  O2 sats on room air not documented in ED.  Diffuse expiratory wheezing heard in ED. ?Patient was evaluated by RT in the ED and was placed on BiPAP. ?2 view chest x-ray without acute abnormality. ?IV Solu-Medrol, DuoNeb started.  Ativan 1 mg oral tablet given. ? ? ? ?Significant Hospital Events: ?Including procedures, antibiotic start and stop dates in addition to other pertinent events   ?ET  4/18 c/b hypotension with severe air trapping  ? ?Interim History / Subjective:  ?Sedated on diprovan, somewhat asynchronous with vent  ? ?Objective   ?Blood pressure (!) 69/41, pulse (!) 135, temperature 97.6 ?F (36.4 ?C), temperature source Axillary, resp. rate 20, height 5\' 1"  (1.549 m), weight 51.3 kg, SpO2 97 %. ?   ?Vent Mode: PRVC ?FiO2 (%):  [35 %-100 %] 50 % ?Set Rate:  [18 bmp] 18 bmp ?Vt Set:  [380 mL] 380 mL ?PEEP:  [5 cmH20] 5 cmH20 ?Plateau Pressure:  [25 cmH20-36 cmH20] 25 cmH20  ? ?Intake/Output Summary (Last 24 hours) at 09/26/2021 1621 ?Last data  filed at 09/26/2021 1501 ?Gross per 24 hour  ?Intake 511.11 ml  ?Output 800 ml  ?Net -288.89 ml  ? ?Filed Weights  ? 09/24/21 1646 09/25/21 0816  ?Weight: 55.4 kg 51.3 kg  ? ? ?Examination: ?Tmax:  98.3 ?General appearance:    acute and chronically ill / barrel chest   ? No jvd ?Oropharynx et ?Neck supple ?Lungs with a few scattered exp > insp rhonchi bilaterally/ marked air trapping  ?RRR no s3 or or sign murmur ?Abd soft benign limited excursion  ?Extr warm with no edema or clubbing noted ?Neuro  Sensorium sedated,  no apparent motor deficits  ? ? ?I personally reviewed images and agree with radiology impression as follows:  ?CXR:  portable 4/18 ?Endotracheal tube overlies the midthoracic trachea. ?Severe emphysema with mild diffuse interstitial opacities, chronic ?interstitial change versus interstitial edema. ?  ? ?Assessment & Plan:  ?1) acute  hypoxemic/hypercabic resp failure secondary to AECOPD in pt with copd gold 3/ [place]  ?>>>> vent with strategy of permissive hypercapnea to avoid air trapping as she is hypoensive  ?>>>> ok to continue LABA and Add LAMA with prn saba/ high dose steroids and GI prophylaxis  ? ?2) Circulatory shock  s/p ET with air trapping on vent  ?- vent settings as above and vol expand with NS ?>>>  Check bnp/trop  nl , really no evidence to support chf or PE at this point  ? ?Best Practice (right click and "Reselect all SmartList Selections" daily)  ? ?Per triad ? ?Labs   ?CBC: ?Recent Labs  ?Lab 09/24/21 ?1707 09/25/21 ?0352 09/26/21 ?0424  ?WBC 12.0* 6.8 8.7  ?NEUTROABS 9.9*  --   --   ?HGB 14.8 12.6 13.3  ?HCT 43.9 38.0 42.7  ?MCV 96.1 97.9 102.2*  ?PLT 162 133* 150  ? ? ?Basic Metabolic Panel: ?Recent Labs  ?Lab 09/24/21 ?1707 09/25/21 ?0352 09/26/21 ?0424  ?NA 137 142 147*  ?K 3.4* 3.7 4.9  ?CL 100 108 111  ?CO2 24 26 29   ?GLUCOSE 140* 143* 162*  ?BUN 26* 27* 35*  ?CREATININE 0.88 0.83 0.93  ?CALCIUM 9.6 9.0 9.8  ? ?GFR: ?Estimated Creatinine Clearance: 46.7 mL/min (by C-G formula  based on SCr of 0.93 mg/dL). ?Recent Labs  ?Lab 09/24/21 ?1707 09/24/21 ?1838 09/24/21 ?2220 09/25/21 ?[id number] 09/26/21 ?0424  ?WBC 12.0*  --   --  6.8 8.7  ?LATICACIDVEN 1.8 2.2* 1.7  --  2.4*  ? ? ?Liver Function Tests: ?Recent Labs  ?Lab 09/24/21 ?1707  ?AST 42*  ?ALT 39  ?ALKPHOS 101  ?BILITOT 0.7  ?PROT 8.5*  ?ALBUMIN 4.7  ? ?No results for input(s): LIPASE, AMYLASE in the last 168 hours. ?No results for input(s): AMMONIA in the last 168 hours. ? ?ABG ?   ?Component Value Date/Time  ? PHART 7.2 (L) 09/26/2021 1528  ? PCO2ART 60 (H) 09/26/2021 1528  ? PO2ART 128 (H) 09/26/2021 1528  ? HCO3 23.8 09/26/2021 1528  ? ACIDBASEDEF 5.4 (H) 09/26/2021 1528  ? O2SAT 99.2 09/26/2021 1528  ?  ? ?Coagulation Profile: ?No results for input(s): INR, PROTIME in the last 168 hours. ? ?Cardiac Enzymes: ?No results for input(s): CKTOTAL, CKMB, CKMBINDEX, TROPONINI in the last 168 hours. ? ?HbA1C: ?Hgb A1c MFr Bld  ?Date/Time Value Ref Range Status  ?09/25/2021 08:08 AM 5.4 4.8 - 5.6 % Final  ?  Comment:  ?  (NOTE) ?Pre diabetes:          5.7%-6.4% ? ?Diabetes:              >6.4% ? ?Glycemic control for   <7.0% ?adults with diabetes ?  ? ? ?CBG: ?No results for input(s): GLUCAP in the last 168 hours. ? ?  ? ?Past Medical History:  ?She,  has a past medical history of Anxiety, Chronic back pain, COPD (chronic obstructive pulmonary disease) (HCC), and Tobacco abuse.  ? ?Surgical History:  ? ?Past Surgical History:  ?Procedure Laterality Date  ? ABDOMINAL HYSTERECTOMY    ? BACK SURGERY    ? ILEO LOOP NEOBLADDER    ? VESICOVAGINAL FISTULA REPAIR    ?  ? ?Social History:  ? reports that she quit smoking about 2 years ago. Her smoking use included cigarettes. She has a 90.00 pack-year smoking history. She has never used smokeless tobacco. She reports that she does not drink alcohol and does not use drugs.  ? ?Family History:  ?Her family history is not on file.  ? ?Allergies ?No Known Allergies  ? ?Home Medications  ?Prior to Admission  medications   ?Medication Sig Start Date End Date Taking? Authorizing Provider  ?albuterol (VENTOLIN HFA) 108 (90 Base) MCG/ACT inhaler INHALE 2 PUFFS INTO THE LUNGS EVERY 6 HOURS AS NEEDED FOR WHEEZE/SHORTNESS OF BREATH 12/14/20  Yes Leslye PeerByrum, Robert S, MD  ?ALPRAZolam Prudy Feeler(XANAX) 1 MG tablet Take 0.5-1 mg by mouth  daily as needed for sleep or anxiety. 03/12/19  Yes [provider]  ?Budeson-Glycopyrrol-Formoterol (BREZTRI AEROSPHERE) 160-9-4.8 MCG/ACT AERO Inhale 2 puffs into the lungs 2 (two) times daily. ?Patient taking differently: Inhale 2 puffs into the lungs 2 (two) times daily. Before lunch 12/21/20  Yes Leslye Peer, MD  ?Budeson-Glycopyrrol-Formoterol (BREZTRI AEROSPHERE) 160-9-4.8 MCG/ACT AERO Inhale 2 puffs into the lungs in the morning and at bedtime. 02/06/21  Yes Parrett, Tammy S, NP  ?busPIRone (BUSPAR) 10 MG tablet Take 10 mg by mouth 3 (three) times daily.   Yes [provider]  ?cefTRIAXone (ROCEPHIN) 1 g injection ceftriaxone 1 gram solution for injection ? Take 1 g every day by injection route as directed for 1 day.   Yes [provider]  ?docusate sodium (COLACE) 100 MG capsule Take 1 capsule (100 mg total) by mouth 2 (two) times daily. ?Patient taking differently: Take 100 mg by mouth 2 (two) times daily as needed (constipation). 05/02/19  Yes Pollyann Savoy, DO  ?doxycycline (VIBRAMYCIN) 100 MG capsule Take 100 mg by mouth 2 (two) times daily. For 7 days started on 09/23/21   Yes [provider]  ?fluticasone (FLONASE) 50 MCG/ACT nasal spray Place 1 spray into both nostrils daily as needed for allergies.   Yes [provider]  ?ibuprofen (ADVIL) 200 MG tablet Take 400 mg by mouth every 8 (eight) hours as needed (pain.).   Yes [provider]  ?ipratropium (ATROVENT HFA) 17 MCG/ACT inhaler Inhale 2 puffs into the lungs every 4 (four) hours as needed for wheezing.   Yes [provider]  ?loratadine (CLARITIN) 10 MG tablet Take 10 mg by  mouth daily as needed for allergies.   Yes [provider]  ?Magnesium 250 MG TABS Take 500 mg by mouth in the morning.   Yes [provider]  ?montelukast (SINGULAIR) 10 MG tablet Take 10 mg

## 2021-09-26 NOTE — Progress Notes (Signed)
Critical Lactic Acid of 2.4.  ?Dr. Camillo Flaming notified via secure chat. Orders received for continuous fluids.  ?

## 2021-09-26 NOTE — Progress Notes (Addendum)
Initial Nutrition Assessment ? ?DOCUMENTATION CODES:  ? ?Not applicable ? ?INTERVENTION:  ?- Initiate tube feeding via NG/OG tube: ?Osmolite 1.2 at 25 ml/h and advance 35ml every 6 hours until a goal rate of 69ml/hr (1320 ml per day) ?Prosource TF 45 ml once daily ? ?Provides 1624 kcal, 84 gm protein, 1082 ml free water daily ? ?Free water flushes of every 6 hours ? ?NUTRITION DIAGNOSIS:  ? ?Inadequate oral intake related to acute illness as evidenced by NPO status. ? ?GOAL:  ? ?Patient will meet greater than or equal to 90% of their needs ? ?MONITOR:  ? ?Labs, Weight trends, TF tolerance ? ?REASON FOR ASSESSMENT:  ? ?Ventilator ?  ? ?ASSESSMENT:  ? ?Pt admitted with difficulty breathing d/t acute respiratory failure with hypoxia and hypercapnia. Pt with diagnosis of PNA 4/16. PMH significant for COPD, uncontrolled anxiety. ? ?Per MD, pt is somnolent and obtunded with ongoing respiratory failure requiring intubation. Spoke with MD, received verbal with readback to initiation tube feeding via NG/OG tube. Noted pt's MAP to be 47. Spoke with RN which she reports is suspected to be d/t propofol. Propofol has been d/c. Informed to hold tube feeding until MAP improves. Will follow up tomorrow.  ? ?Patient is currently intubated on ventilator support ?MV: 9.7 L/min ?Temp (24hrs), Avg:98 ?F (36.7 ?C), Min:97.6 ?F (36.4 ?C), Max:98.3 ?F (36.8 ?C) ? ?Reviewed weight history. Pt noted to have stable weight of 55.4 kg until the past week, it appears she has had a 7% weight loss since 4/11 which is significant for time frame.  ? ?Medications: zithromax, peridex, colace, IV solu-medrol, protonix, miralax ? ?Labs: sodium 147, BUN 35, AST 42 ? ?NUTRITION - FOCUSED PHYSICAL EXAM: ?RD working remotely. Deferred to follow up. ? ?Diet Order:   ?Diet Order   ? ?       ?  Diet regular Room service appropriate? Yes; Fluid consistency: Thin  Diet effective now       ?  ? ?  ?  ? ?  ? ?EDUCATION NEEDS:  ? ?No education needs have been  identified at this time ? ?Skin:  Skin Assessment: Reviewed RN Assessment ? ?Last BM:  4/17 ? ?Height:  ? ?Ht Readings from Last 1 Encounters:  ?09/25/21 5\' 1"  (1.549 m)  ? ?Weight:  ? ?Wt Readings from Last 1 Encounters:  ?09/25/21 51.3 kg  ? ?BMI:  Body mass index is 21.37 kg/m?. ? ?Estimated Nutritional Needs:  ? ?Kcal:  1500-1700 ? ?Protein:  75-90g ? ?Fluid:  >/=1.5L ? ?09/27/21, RDN, LDN ?Clinical Nutrition ?

## 2021-09-26 NOTE — Progress Notes (Signed)
?  ?       ?PROGRESS NOTE ? ?[name] [id number] DOB: [birth year] DOA: 09/24/2021 ?PCP: Everardo Beals, NP ? ?Brief History:  ? 66 y.o. female with medical history significant for COPD, uncontrolled anxiety. ?Patient presented to the ED with complaints of difficulty breathing, cough, wheezing of about 1 week duration.  She also reports wheezing, and fatigue.  She reports O2 sats down to 78% on room air. ?She reports she was diagnosed with pneumonia yesterday, given a shot of antibiotic continue on antibiotics and steroids. ?Despite use of inhalers, breathing continues to worsen.  No leg swelling no chest pain.  Patient has a history of anxiety and panic attacks. ?EMS reports patient had rhonchi, O2 sats in the 80s on arrival to the ED.   In the ED, patient was placed BiPAP. ?She was subsequently weaned to to [facility], but in the afternoon 4/19, she began having sob again with tachypnea for which she was placed back on BiPAP.  ? ?Assessment and Plan: ?* Acute respiratory failure with hypoxia and hypercapnia (HCC) ?Due to COPD exacerbation ?-09/26/21 placed back on BiPAP ?-repeat ABG ?-repeat CXR ?-continue azithromycin ? ?COPD with acute exacerbation ([place]) ?-restart IV solumedrol ?-start xopenex and atrovent ?-start Yupelri if available ?-continue pulmicort and brovana ? ?Lactic acidosis ?Driven primarily from her hypoxia and respiratory failure ?Sepsis ruled out ? ?Hyperglycemia ?- secondary to the use of his steroids ?-No prior history of diabetes ?-4/18 check A1c--5.4 ? ?GERD (gastroesophageal reflux disease) ?- Continue PPI. ? ?Anxiety ?-Large anxiety component contributing to her dyspnea today.  ?-Continue adjusted dose of Ativan and resume the use of BuSpar. ?-give morphine dose x 1 ? ?Hypokalemia ?-replete ?-check mag ? ? ? ? ? ?Family Communication:   sister and spouse updated 4/19 ? ?Consultants:  none ? ?Code Status:  FULL ? ?DVT Prophylaxis:   [place] Lovenox ? ? ?Procedures: ?As Listed in Progress Note  Above ? ?Antibiotics: ?Azithro 4/17>> ? ? ? ?Subjective: ?Patient complains of sob.  Denies f/c, cp, n/v/d, abd pain ? ?Objective: ?Vitals:  ? 09/26/21 0626 09/26/21 0700 09/26/21 0742 09/26/21 1100  ?BP:  (!) 148/88    ?Pulse: 87 94    ?Resp: (!) 24 (!) 23    ?Temp:  98.3 ?F (36.8 ?C)  98.2 ?F (36.8 ?C)  ?TempSrc:  Oral  Oral  ?SpO2: 99% 99% 100%   ?Weight:      ?Height:      ? ? ?Intake/Output Summary (Last 24 hours) at 09/26/2021 1313 ?Last data filed at 09/26/2021 0559 ?Gross per 24 hour  ?Intake 118.3 ml  ?Output 500 ml  ?Net -381.7 ml  ? ?Weight change: -4.1 kg ?Exam: ? ?General:  Pt is alert, follows commands appropriately, not in acute distress ?HEENT: No icterus, No thrush, No neck mass, [place]/AT ?Cardiovascular: RRR, S1/S2, no rubs, no gallops ?Respiratory: bilateral wheeze.  Bibasilar rales.  Diminished BS bilateral ?Abdomen: Soft/+BS, non tender, non distended, no guarding ?Extremities: No edema, No lymphangitis, No petechiae, No rashes, no synovitis ? ? ?Data Reviewed: ?I have personally reviewed following labs and imaging studies ?Basic Metabolic Panel: ?Recent Labs  ?Lab 09/24/21 ?1707 09/25/21 ?0352 09/26/21 ?0424  ?NA 137 142 147*  ?K 3.4* 3.7 4.9  ?CL 100 108 111  ?CO2 24 26 29   ?GLUCOSE 140* 143* 162*  ?BUN 26* 27* 35*  ?CREATININE 0.88 0.83 0.93  ?CALCIUM 9.6 9.0 9.8  ? ?Liver Function Tests: ?Recent Labs  ?Lab 09/24/21 ?1707  ?AST 42*  ?ALT  39  ?ALKPHOS 101  ?BILITOT 0.7  ?PROT 8.5*  ?ALBUMIN 4.7  ? ?No results for input(s): LIPASE, AMYLASE in the last 168 hours. ?No results for input(s): AMMONIA in the last 168 hours. ?Coagulation Profile: ?No results for input(s): INR, PROTIME in the last 168 hours. ?CBC: ?Recent Labs  ?Lab 09/24/21 ?1707 09/25/21 ?0352 09/26/21 ?0424  ?WBC 12.0* 6.8 8.7  ?NEUTROABS 9.9*  --   --   ?HGB 14.8 12.6 13.3  ?HCT 43.9 38.0 42.7  ?MCV 96.1 97.9 102.2*  ?PLT 162 133* 150  ? ?Cardiac Enzymes: ?No results for input(s): CKTOTAL, CKMB, CKMBINDEX, TROPONINI in the last 168  hours. ?BNP: ?Invalid input(s): POCBNP ?CBG: ?No results for input(s): GLUCAP in the last 168 hours. ?HbA1C: ?Recent Labs  ?  09/25/21 ?0808  ?HGBA1C 5.4  ? ?Urine analysis: ?   ?Component Value Date/Time  ? [place] YELLOW 05/14/2019 1955  ? APPEARANCEUR CLEAR 05/14/2019 1955  ? LABSPEC 1.010 05/14/2019 1955  ? PHURINE 7.0 05/14/2019 1955  ? [place] NEGATIVE 05/14/2019 1955  ? HGBUR SMALL (A) 05/14/2019 1955  ? [place] NEGATIVE 05/14/2019 1955  ? [place] NEGATIVE 05/14/2019 1955  ? [place] NEGATIVE 05/14/2019 1955  ? UROBILINOGEN 0.2 05/11/2007 1915  ? NITRITE NEGATIVE 05/14/2019 1955  ? LEUKOCYTESUR NEGATIVE 05/14/2019 1955  ? ?Sepsis Labs: ?@LABRCNTIP (procalcitonin:4,lacticidven:4) ?) ?Recent Results (from the past 240 hour(s))  ?Resp Panel by RT-PCR (Flu A&B, Covid) Nasopharyngeal Swab     Status: None  ? Collection Time: 09/24/21  5:36 PM  ? Specimen: Nasopharyngeal Swab; Nasopharyngeal(NP) swabs in vial transport medium  ?Result Value Ref Range Status  ? SARS Coronavirus 2 by RT PCR NEGATIVE NEGATIVE Final  ?  Comment: (NOTE) ?SARS-CoV-2 target nucleic acids are NOT DETECTED. ? ?The SARS-CoV-2 RNA is generally detectable in upper respiratory ?specimens during the acute phase of infection. The lowest ?concentration of SARS-CoV-2 viral copies this assay can detect is ?138 copies/mL. A negative result does not preclude SARS-Cov-2 ?infection and should not be used as the sole basis for treatment or ?other patient management decisions. A negative result may occur with  ?improper specimen collection/handling, submission of specimen other ?than nasopharyngeal swab, presence of viral mutation(s) within the ?areas targeted by this assay, and inadequate number of viral ?copies(<138 copies/mL). A negative result must be combined with ?clinical observations, patient history, and epidemiological ?information. The expected result is Negative. ? ?Fact Sheet for Patients:   ?EntrepreneurPulse.com.au ? ?Fact Sheet for Healthcare Providers:  ?IncredibleEmployment.be ? ?This test is no t yet approved or cleared by the [place] FDA and  ?has been authorized for detection and/or diagnosis of SARS-CoV-2 by ?FDA under an Emergency Use Authorization (EUA). This EUA will remain  ?in effect (meaning this test can be used) for the duration of the ?COVID-19 declaration under Section 564(b)(1) of the Act, 21 ?U.S.C.section 360bbb-3(b)(1), unless the authorization is terminated  ?or revoked sooner.  ? ? ?  ? Influenza A by PCR NEGATIVE NEGATIVE Final  ? Influenza B by PCR NEGATIVE NEGATIVE Final  ?  Comment: (NOTE) ?The Xpert Xpress SARS-CoV-2/FLU/RSV plus assay is intended as an aid ?in the diagnosis of influenza from Nasopharyngeal swab specimens and ?should not be used as a sole basis for treatment. Nasal washings and ?aspirates are unacceptable for Xpert Xpress SARS-CoV-2/FLU/RSV ?testing. ? ?Fact Sheet for Patients: ?EntrepreneurPulse.com.au ? ?Fact Sheet for Healthcare Providers: ?IncredibleEmployment.be ? ?This test is not yet approved or cleared by the [place] FDA and ?has been authorized for detection and/or diagnosis of  SARS-CoV-2 by ?FDA under an Emergency Use Authorization (EUA). This EUA will remain ?in effect (meaning this test can be used) for the duration of the ?COVID-19 declaration under Section 564(b)(1) of the Act, 21 U.S.C. ?section 360bbb-3(b)(1), unless the authorization is terminated or ?revoked. ? ?Performed at [facility], [address] [place], [place] [address] ?  ?MRSA Next Gen by PCR, Nasal     Status: None  ? Collection Time: 09/25/21  7:02 AM  ? Specimen: Nasal Mucosa; Nasal Swab  ?Result Value Ref Range Status  ? MRSA by PCR Next Gen NOT DETECTED NOT DETECTED Final  ?  Comment: (NOTE) ?The GeneXpert MRSA Assay (FDA approved for NASAL specimens only), ?is one component of a  comprehensive MRSA colonization surveillance ?program. It is not intended to diagnose MRSA infection nor to guide ?or monitor treatment for MRSA infections. ?Test performance is not FDA approved in patients less than 2 years ?old. ?Performed at [facility]

## 2021-09-26 NOTE — Progress Notes (Signed)
Responded to nursing call:  abnormal ABG results and pt becoming more obtunded even prior to morphine ? ?ABG--7.09/106/232/31 on 70% at 1334 ? ? ?Subjective: ?Patient is somnolent ? ?Vitals:  ? 09/26/21 0700 09/26/21 0742 09/26/21 1100 09/26/21 1344  ?BP: (!) 148/88   (!) 134/99  ?Pulse: 94   (!) 138  ?Resp: (!) 23   (!) 28  ?Temp: 98.3 ?F (36.8 ?C)  98.2 ?F (36.8 ?C)   ?TempSrc: Oral  Oral   ?SpO2: 99% 100%  100%  ?Weight:      ?Height:      ? ?CV--RRR ?Lung--diminished BS;  bilateral rales ?Abd--soft+BS/NT ? ? ?Assessment/Plan: ?Acute on chronic respiratory failure with hypoxia and hypercarbia ?-ABG as discussed above ?-discussed with sister at bedside>>agrees with intubation ?-consult PCCM after intubation to assist ?-continue BDs and IV steroids ?-Anesthesia, Dr. Johnnette Litter assisted with intubation ?-repeat ABG in one hour after placing on vent ? ? ? ? ?Catarina Hartshorn, DO ?[facility] ? ?

## 2021-09-26 NOTE — Hospital Course (Addendum)
Mrs. Laukaitis is a 66 y.o. F with COPD, anxiety who presented with difficulty breathing, wheezing and cough for 1 week.    In the ER, SpO2 80s, in distress.  Placed on BiPAP.  STarted on treatment for COPD.     4/17: Admitted to [facility] and started on treatment 4/18: ABG 7.09/105/232/31 on BiPAP, intubated  4/20: Echo showed reduced EF 4/21: Transferred to [facility] from [place] for Cardiology evaluation 4/29: New fever 5/1: Lurline Idol placed 5/11: Advanced to regular diet but low oral intake noted 5/15: Cuffless trach, transferred [place] 5/17: Tracheal aspirate showed Klebsiella 5/23: Developed abdominal pain, distension, CT showed ?SBO, Gen Surg consulted 5/25: Symptoms resolved with conservative measures 5/26: Gen Surg signed off 5/30: Abdominal distension recurred, radiograph showing distended bowel, Gen Surg reconsulted, suspected ileus 6/1: Abdominal symptoms mostly resolved 6/2-present: Stable pending placement

## 2021-09-27 ENCOUNTER — Encounter (HOSPITAL_COMMUNITY): Payer: Medicare Other

## 2021-09-27 ENCOUNTER — Inpatient Hospital Stay (HOSPITAL_COMMUNITY): Payer: Medicare Other

## 2021-09-27 DIAGNOSIS — J9622 Acute and chronic respiratory failure with hypercapnia: Secondary | ICD-10-CM

## 2021-09-27 DIAGNOSIS — E870 Hyperosmolality and hypernatremia: Secondary | ICD-10-CM

## 2021-09-27 DIAGNOSIS — I5030 Unspecified diastolic (congestive) heart failure: Secondary | ICD-10-CM | POA: Diagnosis not present

## 2021-09-27 DIAGNOSIS — J9601 Acute respiratory failure with hypoxia: Secondary | ICD-10-CM | POA: Diagnosis not present

## 2021-09-27 DIAGNOSIS — J9621 Acute and chronic respiratory failure with hypoxia: Secondary | ICD-10-CM

## 2021-09-27 DIAGNOSIS — J441 Chronic obstructive pulmonary disease with (acute) exacerbation: Secondary | ICD-10-CM | POA: Diagnosis not present

## 2021-09-27 DIAGNOSIS — J9602 Acute respiratory failure with hypercapnia: Secondary | ICD-10-CM | POA: Diagnosis not present

## 2021-09-27 LAB — COMPREHENSIVE METABOLIC PANEL
ALT: 1227 U/L — ABNORMAL HIGH (ref 0–44)
AST: 1229 U/L — ABNORMAL HIGH (ref 15–41)
Albumin: 3.3 g/dL — ABNORMAL LOW (ref 3.5–5.0)
Alkaline Phosphatase: 64 U/L (ref 38–126)
Anion gap: 5 (ref 5–15)
BUN: 43 mg/dL — ABNORMAL HIGH (ref 8–23)
CO2: 25 mmol/L (ref 22–32)
Calcium: 8.7 mg/dL — ABNORMAL LOW (ref 8.9–10.3)
Chloride: 119 mmol/L — ABNORMAL HIGH (ref 98–111)
Creatinine, Ser: 0.86 mg/dL (ref 0.44–1.00)
GFR, Estimated: >60 mL/min (ref >60)
Glucose, Bld: 156 mg/dL — ABNORMAL HIGH (ref 70–99)
Potassium: 4.9 mmol/L (ref 3.5–5.1)
Sodium: 149 mmol/L — ABNORMAL HIGH (ref 135–145)
Total Bilirubin: 0.7 mg/dL (ref 0.3–1.2)
Total Protein: 6.1 g/dL — ABNORMAL LOW (ref 6.5–8.1)

## 2021-09-27 LAB — CBC
HCT: 39.6 % (ref 36.0–46.0)
Hemoglobin: 12.4 g/dL (ref 12.0–15.0)
MCH: 32.7 pg (ref 26.0–34.0)
MCHC: 31.3 g/dL (ref 30.0–36.0)
MCV: 104.5 fL — ABNORMAL HIGH (ref 80.0–100.0)
Platelets: 109 10*3/uL — ABNORMAL LOW (ref 150–400)
RBC: 3.79 MIL/uL — ABNORMAL LOW (ref 3.87–5.11)
RDW: 13.4 % (ref 11.5–15.5)
WBC: 8.8 10*3/uL (ref 4.0–10.5)
nRBC: 0 % (ref 0.0–0.2)

## 2021-09-27 LAB — ECHOCARDIOGRAM COMPLETE
AR max vel: 2.04 cm2
AV Area VTI: 2.25 cm2
AV Area mean vel: 1.93 cm2
AV Mean grad: 1 mmHg
AV Peak grad: 2.7 mmHg
Ao pk vel: 0.81 m/s
Area-P 1/2: 4.26 cm2
Height: 61 in
MV VTI: 1.97 cm2
S' Lateral: 2.8 cm
Weight: 1869.5 oz

## 2021-09-27 LAB — BLOOD GAS, ARTERIAL
Acid-base deficit: 0.2 mmol/L (ref 0.0–2.0)
Bicarbonate: 26.4 mmol/L (ref 20.0–28.0)
Drawn by: 27733
FIO2: 40 %
O2 Saturation: 99.9 %
Patient temperature: 36.4
pCO2 arterial: 49 mmHg — ABNORMAL HIGH (ref 32–48)
pH, Arterial: 7.34 — ABNORMAL LOW (ref 7.35–7.45)
pO2, Arterial: 87 mmHg (ref 83–108)

## 2021-09-27 LAB — GLUCOSE, CAPILLARY
Glucose-Capillary: 137 mg/dL — ABNORMAL HIGH (ref 70–99)
Glucose-Capillary: 153 mg/dL — ABNORMAL HIGH (ref 70–99)
Glucose-Capillary: 154 mg/dL — ABNORMAL HIGH (ref 70–99)
Glucose-Capillary: 163 mg/dL — ABNORMAL HIGH (ref 70–99)
Glucose-Capillary: 176 mg/dL — ABNORMAL HIGH (ref 70–99)
Glucose-Capillary: 185 mg/dL — ABNORMAL HIGH (ref 70–99)

## 2021-09-27 LAB — PHOSPHORUS
Phosphorus: 3.4 mg/dL (ref 2.5–4.6)
Phosphorus: 3.1 mg/dL (ref 2.5–4.6)

## 2021-09-27 LAB — MAGNESIUM: Magnesium: 2.6 mg/dL — ABNORMAL HIGH (ref 1.7–2.4)

## 2021-09-27 LAB — BRAIN NATRIURETIC PEPTIDE: B Natriuretic Peptide: 1052 pg/mL — ABNORMAL HIGH (ref 0.0–100.0)

## 2021-09-27 LAB — PROCALCITONIN: Procalcitonin: 0.1 ng/mL

## 2021-09-27 MED ORDER — SODIUM CHLORIDE 0.45 % IV SOLN: INTRAVENOUS | Status: DC

## 2021-09-27 MED ORDER — ORAL CARE MOUTH RINSE
15.0000 mL | OROMUCOSAL | Status: DC
  Administered 2021-09-27 – 2021-10-03 (×55): 15 mL via OROMUCOSAL

## 2021-09-27 MED ORDER — FREE WATER
200.0000 mL | Freq: Four times a day (QID) | Status: DC
  Administered 2021-09-27 – 2021-10-01 (×16): 200 mL

## 2021-09-27 NOTE — Progress Notes (Addendum)
? ?NAME:  [name], MRN:  [id number], DOB:  19-Dec-1955, LOS: 2 ?ADMISSION DATE:  09/24/2021, CONSULTATION DATE:  4/18 ?REFERRING MD:  Tat/ [facility], CHIEF COMPLAINT:  resp distress   ? ?History of Present Illness:  ?66 y.o. female quit smoking 2021 with  GOLD 3 COPD MZ, uncontrolled anxiety. Was on 02 one year PTA but "took herself off" per friend at bedside and able to care for pets at home but mostly housebound sinc ethen ? ?Patient presented to the ED with complaints of difficulty breathing, cough, wheezing x 1 week duration.  She also reported wheezing, and fatigue with O2 sats down to 78% on room air. ?She reported she was diagnosed with pneumonia one day PTA and given a shot of antibiotic continue on antibiotics and steroids. ?  ?EMS reported patient had rhonchi, O2 sats in the 80s on arrival to the ED.     ?  ?ED Course: Patient 98.8.  Heart rate 92-139.  Tachypneic with respiratory rate 33-37.  Blood pressure systolic Q000111Q to Q000111Q.  O2 sats 96 to 100% on 4 L.  O2 sats on room air not documented in ED.  Diffuse expiratory wheezing heard in ED. ?Patient was evaluated by RT in the ED and was placed on BiPAP. ?2 view chest x-ray without acute abnormality. ?IV Solu-Medrol, DuoNeb started.  Ativan 1 mg oral tablet given. ? ? ?Scheduled Meds: ? arformoterol  15 mcg Nebulization BID  ? azithromycin  500 mg Oral Daily  ? budesonide (PULMICORT) nebulizer solution  0.5 mg Nebulization BID  ? busPIRone  10 mg Oral TID  ? chlorhexidine  15 mL Mouth Rinse BID  ? Chlorhexidine Gluconate Cloth  6 each Topical Q0600  ? docusate  100 mg Per Tube BID  ? enoxaparin (LOVENOX) injection  40 mg Subcutaneous Q24H  ? feeding supplement (PROSource TF)  45 mL Per Tube Daily  ? free water  100 mL Per Tube Q6H  ? mouth rinse  15 mL Mouth Rinse q12n4p  ? methylPREDNISolone (SOLU-MEDROL) injection  80 mg Intravenous Q8H  ? montelukast  10 mg Oral q AM  ? pantoprazole (PROTONIX) IV  40 mg Intravenous Q12H  ? polyethylene glycol  17 g Per  Tube Daily  ? revefenacin  175 mcg Nebulization Daily  ? ?Continuous Infusions: ? sodium chloride 100 mL/hr at 09/27/21 0251  ? feeding supplement (OSMOLITE 1.2 CAL) 1,000 mL (09/27/21 0615)  ? fentaNYL infusion INTRAVENOUS 100 mcg/hr (09/26/21 1638)  ? midazolam 5 mg/hr (09/26/21 2100)  ? ?PRN Meds:.acetaminophen **OR** acetaminophen, guaiFENesin-dextromethorphan, levalbuterol, LORazepam, polyethylene glycol  ? ? ?Significant Hospital Events: ?Including procedures, antibiotic start and stop dates in addition to other pertinent events   ?ET  4/18 c/b hypotension with severe air trapping  ? ?Interim History / Subjective:  ?Looks much more comfortable on fent/ versed drip/ bp stablized s pressors and not air trapping as badl ? ?Objective   ?Blood pressure 105/60, pulse 85, temperature 97.6 ?F (36.4 ?C), temperature source Axillary, resp. rate 15, height 5\' 1"  (1.549 m), weight 53 kg, SpO2 96 %. ?   ?Vent Mode: PRVC ?FiO2 (%):  [40 %-100 %] 40 % ?Set Rate:  [15 bmp-18 bmp] 15 bmp ?Vt Set:  [380 mL-500 mL] 500 mL ?PEEP:  [5 cmH20] 5 cmH20 ?Plateau Pressure:  [16 cmH20-36 cmH20] 16 cmH20  ? ?Intake/Output Summary (Last 24 hours) at 09/27/2021 0716 ?Last data filed at 09/27/2021 [id number] ?Gross per 24 hour  ?Intake 1964.01 ml  ?Output 450 ml  ?  Net 1514.01 ml  ? ?Filed Weights  ? 09/24/21 1646 09/25/21 0816 09/27/21 0500  ?Weight: 55.4 kg 51.3 kg 53 kg  ? ? ?Examination: ?Tmax:  98.2 ?General appearance:    elderly wf sedated on vent  ? ?No jvd ?Oropharynx et/og  ?Neck supple ?Lungs with  very distant BS bilaterally ?RRR no s3 or or sign murmur ?Abd soft/ limited  excursion  ?Extr warm with no edema or clubbing noted ?Neuro  Sensorium sedated, no resp to verbal,  no apparent motor deficits  ?  ? ?Assessment & Plan:  ?1) acute  hypoxemic/hypercabic resp failure secondary to AECOPD in pt with copd gold3  AZ  ?>>>> continue vent with strategy of permissive hypercapnea to avoid air trapping as she is hypoensive  ?>>>> ok to continue  LABA and Add LAMA with prn saba/ high dose steroids and GI prophylaxis  ? ?2) Circulatory shock  s/p ET with air trapping on vent  ?- vent settings as above and vol expanded with NS > resolved 4/20 ?>>>  BNP elevated >>> Check  echo  ? ?Best Practice (right click and "Reselect all SmartList Selections" daily)  ? ?Per [facility] ? ?Labs   ?CBC: ?Recent Labs  ?Lab 09/24/21 ?1707 09/25/21 ?0352 09/26/21 ?0424 09/27/21 ?0420  ?WBC 12.0* 6.8 8.7 8.8  ?NEUTROABS 9.9*  --   --   --   ?HGB 14.8 12.6 13.3 12.4  ?HCT 43.9 38.0 42.7 39.6  ?MCV 96.1 97.9 102.2* 104.5*  ?PLT 162 133* 150 109*  ? ? ?Basic Metabolic Panel: ?Recent Labs  ?Lab 09/24/21 ?1707 09/25/21 ?0352 09/26/21 ?0424 09/26/21 ?1658 09/27/21 ?0420  ?NA 137 142 147*  --  149*  ?K 3.4* 3.7 4.9  --  4.9  ?CL 100 108 111  --  119*  ?CO2 24 26 29   --  25  ?GLUCOSE 140* 143* 162*  --  156*  ?BUN 26* 27* 35*  --  43*  ?CREATININE 0.88 0.83 0.93  --  0.86  ?CALCIUM 9.6 9.0 9.8  --  8.7*  ?MG  --   --   --   --  2.6*  ?PHOS  --   --   --  6.0* 3.4  ? ?GFR: ?Estimated Creatinine Clearance: 50.5 mL/min (by C-G formula based on SCr of 0.86 mg/dL). ?Recent Labs  ?Lab 09/24/21 ?1707 09/24/21 ?1838 09/24/21 ?2220 09/25/21 ?[id number] 09/26/21 ?0424 09/27/21 ?[id number]  ?PROCALCITON  --   --   --   --   --  0.10  ?WBC 12.0*  --   --  6.8 8.7 8.8  ?LATICACIDVEN 1.8 2.2* 1.7  --  2.4*  --   ? ? ?Liver Function Tests: ?Recent Labs  ?Lab 09/24/21 ?1707 09/27/21 ?0420  ?AST 42* 1,229*  ?ALT 39 1,227*  ?ALKPHOS 101 64  ?BILITOT 0.7 0.7  ?PROT 8.5* 6.1*  ?ALBUMIN 4.7 3.3*  ? ?No results for input(s): LIPASE, AMYLASE in the last 168 hours. ?No results for input(s): AMMONIA in the last 168 hours. ? ?ABG ?   ?Component Value Date/Time  ? PHART 7.24 (L) 09/26/2021 1807  ? PCO2ART 59 (H) 09/26/2021 1807  ? PO2ART 89 09/26/2021 1807  ? HCO3 25.3 09/26/2021 1807  ? ACIDBASEDEF 3.2 (H) 09/26/2021 1807  ? O2SAT 97.4 09/26/2021 1807  ?  ? ?Coagulation Profile: ?No results for input(s): INR, PROTIME in the last  168 hours. ? ?Cardiac Enzymes: ?No results for input(s): CKTOTAL, CKMB, CKMBINDEX, TROPONINI in the last 168 hours. ? ?HbA1C: ?Hgb  A1c MFr Bld  ?Date/Time Value Ref Range Status  ?09/25/2021 08:08 AM 5.4 4.8 - 5.6 % Final  ?  Comment:  ?  (NOTE) ?Pre diabetes:          5.7%-6.4% ? ?Diabetes:              >6.4% ? ?Glycemic control for   <7.0% ?adults with diabetes ?  ? ? ?CBG: ?Recent Labs  ?Lab 09/26/21 ?1930 09/26/21 ?2325 09/27/21 ?0423  ?GLUCAP 134* 138* 137*  ? ? ?The patient is critically ill with multiple organ systems failure and requires high complexity decision making for assessment and support, frequent evaluation and titration of therapies, application of advanced monitoring technologies and extensive interpretation of multiple databases. Critical Care Time devoted to patient care services described in this note is 38  minutes. ? ?Discussed condition/ plans with nephew at bedside. ? ? ?Christinia Gully, MD ?Pulmonary and Critical Care Medicine ?[place] ?Cell [phone]  ? ?After 7:00 pm call Elink  [phone]    ?  ?

## 2021-09-27 NOTE — Progress Notes (Signed)
Weaned patient from 50% FIO2 down to 40%.  Sat at 50% was 98%, patient dropped to 96% when went down to 40%.  SBT could be tried on patient, not quite sure how patient will respond. ?

## 2021-09-27 NOTE — Progress Notes (Signed)
?  ?       ?PROGRESS NOTE ? ?[name] [id number] DOB: [birth year] DOA: 09/24/2021 ?PCP: Everardo Beals, NP ? ?Brief History:  ? 66 y.o. female with medical history significant for COPD, uncontrolled anxiety. ?Patient presented to the ED with complaints of difficulty breathing, cough, wheezing of about 1 week duration.  She also reports wheezing, and fatigue.  She reports O2 sats down to 78% on room air. ?She reports she was diagnosed with pneumonia yesterday, given a shot of antibiotic continue on antibiotics and steroids. ?Despite use of inhalers, breathing continues to worsen.  No leg swelling no chest pain.  Patient has a history of anxiety and panic attacks. ?EMS reports patient had rhonchi, O2 sats in the 80s on arrival to the ED.   In the ED, patient was placed BiPAP. ?She was subsequently weaned to to [facility], but in the afternoon 4/19, she began having sob again with tachypnea for which she was placed back on BiPAP. ?ABG showed hypercarbic respiratory failure 7.09/105/232/31 on biPAP, so she was intubated.  PCCM was consulted to assist.  ? ? ? ?Assessment and Plan: ?* Acute respiratory failure with hypoxia and hypercapnia (HCC) ?Due to COPD exacerbation ?-09/26/21 intubated ?-4/20 ABG--7.34/49/87/26 (0.4) ?-repeat CXR-personally reviewed--chronic interstitial markings ?-continue azithromycin ?-appreciate PCCM consult and follow up ? ?COPD with acute exacerbation ([place]) ?-continue IV solumedrol ?-continue xopenex and atrovent ?-continue Yupelri ?-continue pulmicort and brovana ?-appreciate PCCM ?-wean to extubation ? ?Hypernatremia ?Switch to 1/2NS ? ?Lactic acidosis ?Driven primarily from her hypoxia and respiratory failure ?Sepsis ruled out ? ?Hyperglycemia ?- secondary to the use of his steroids ?-No prior history of diabetes ?-4/18 check A1c--5.4 ? ?GERD (gastroesophageal reflux disease) ?- Continue PPI. ? ?Anxiety ?-Large anxiety component contributing to her dyspnea  ?-continue fentanyl and  versed ? ?Hypokalemia ?-replete ?-check mag 2.6 ? ? ? ? ? ? ? ? ? ?Family Communication:   nephew updated at bedside 4/20 ? ?Consultants:  PCCM ? ?Code Status:  FULL  ? ?DVT Prophylaxis:  [place] Lovenox ? ? ?Procedures: ?As Listed in Progress Note Above ? ?Antibiotics: ?Azithro 4/17>> ? ? ? ? ? ?Subjective: ?Intubated and sedated.  ROS not possible ? ?Objective: ?Vitals:  ? 09/27/21 1400 09/27/21 1500 09/27/21 1600 09/27/21 1700  ?BP: (!) 119/59 114/71 114/68 99/63  ?Pulse: 86 81 81 82  ?Resp: 15 15 12 14   ?Temp:      ?TempSrc:      ?SpO2: 95% 98% 94% 96%  ?Weight:      ?Height:      ? ? ?Intake/Output Summary (Last 24 hours) at 09/27/2021 1744 ?Last data filed at 09/27/2021 1704 ?Gross per 24 hour  ?Intake 3853.18 ml  ?Output 450 ml  ?Net 3403.18 ml  ? ?Weight change: 1.7 kg ?Exam: ? ?General:  Pt is intubated and sedated.  No distress ?HEENT: No icterus, No thrush, No neck mass, [place]/AT ?Cardiovascular: RRR, S1/S2, no rubs, no gallops ?Respiratory: bibasilar rales.  Bibasilar wheeze.  Diminished BS ?Abdomen: Soft/+BS, non tender, non distended, no guarding ?Extremities: No edema, No lymphangitis, No petechiae, No rashes, no synovitis ? ? ?Data Reviewed: ?I have personally reviewed following labs and imaging studies ?Basic Metabolic Panel: ?Recent Labs  ?Lab 09/24/21 ?1707 09/25/21 ?0352 09/26/21 ?0424 09/26/21 ?1658 09/27/21 ?0420  ?NA 137 142 147*  --  149*  ?K 3.4* 3.7 4.9  --  4.9  ?CL 100 108 111  --  119*  ?CO2 24 26 29   --  25  ?GLUCOSE  140* 143* 162*  --  156*  ?BUN 26* 27* 35*  --  43*  ?CREATININE 0.88 0.83 0.93  --  0.86  ?CALCIUM 9.6 9.0 9.8  --  8.7*  ?MG  --   --   --   --  2.6*  ?PHOS  --   --   --  6.0* 3.4  ? ?Liver Function Tests: ?Recent Labs  ?Lab 09/24/21 ?1707 09/27/21 ?0420  ?AST 42* 1,229*  ?ALT 39 1,227*  ?ALKPHOS 101 64  ?BILITOT 0.7 0.7  ?PROT 8.5* 6.1*  ?ALBUMIN 4.7 3.3*  ? ?No results for input(s): LIPASE, AMYLASE in the last 168 hours. ?No results for input(s): AMMONIA in the last 168  hours. ?Coagulation Profile: ?No results for input(s): INR, PROTIME in the last 168 hours. ?CBC: ?Recent Labs  ?Lab 09/24/21 ?1707 09/25/21 ?0352 09/26/21 ?0424 09/27/21 ?0420  ?WBC 12.0* 6.8 8.7 8.8  ?NEUTROABS 9.9*  --   --   --   ?HGB 14.8 12.6 13.3 12.4  ?HCT 43.9 38.0 42.7 39.6  ?MCV 96.1 97.9 102.2* 104.5*  ?PLT 162 133* 150 109*  ? ?Cardiac Enzymes: ?No results for input(s): CKTOTAL, CKMB, CKMBINDEX, TROPONINI in the last 168 hours. ?BNP: ?Invalid input(s): POCBNP ?CBG: ?Recent Labs  ?Lab 09/26/21 ?2325 09/27/21 ?0423 09/27/21 ?[id number] 09/27/21 ?1108 09/27/21 ?1529  ?GLUCAP 138* 137* 154* 153* 163*  ? ?HbA1C: ?Recent Labs  ?  09/25/21 ?0808  ?HGBA1C 5.4  ? ?Urine analysis: ?   ?Component Value Date/Time  ? [place] YELLOW 05/14/2019 1955  ? APPEARANCEUR CLEAR 05/14/2019 1955  ? LABSPEC 1.010 05/14/2019 1955  ? PHURINE 7.0 05/14/2019 1955  ? [place] NEGATIVE 05/14/2019 1955  ? HGBUR SMALL (A) 05/14/2019 1955  ? [place] NEGATIVE 05/14/2019 1955  ? [place] NEGATIVE 05/14/2019 1955  ? [place] NEGATIVE 05/14/2019 1955  ? UROBILINOGEN 0.2 05/11/2007 1915  ? NITRITE NEGATIVE 05/14/2019 1955  ? LEUKOCYTESUR NEGATIVE 05/14/2019 1955  ? ?Sepsis Labs: ?@LABRCNTIP (procalcitonin:4,lacticidven:4) ?) ?Recent Results (from the past 240 hour(s))  ?Resp Panel by RT-PCR (Flu A&B, Covid) Nasopharyngeal Swab     Status: None  ? Collection Time: 09/24/21  5:36 PM  ? Specimen: Nasopharyngeal Swab; Nasopharyngeal(NP) swabs in vial transport medium  ?Result Value Ref Range Status  ? SARS Coronavirus 2 by RT PCR NEGATIVE NEGATIVE Final  ?  Comment: (NOTE) ?SARS-CoV-2 target nucleic acids are NOT DETECTED. ? ?The SARS-CoV-2 RNA is generally detectable in upper respiratory ?specimens during the acute phase of infection. The lowest ?concentration of SARS-CoV-2 viral copies this assay can detect is ?138 copies/mL. A negative result does not preclude SARS-Cov-2 ?infection and should not be used as the sole basis for treatment  or ?other patient management decisions. A negative result may occur with  ?improper specimen collection/handling, submission of specimen other ?than nasopharyngeal swab, presence of viral mutation(s) within the ?areas targeted by this assay, and inadequate number of viral ?copies(<138 copies/mL). A negative result must be combined with ?clinical observations, patient history, and epidemiological ?information. The expected result is Negative. ? ?Fact Sheet for Patients:  ?EntrepreneurPulse.com.au ? ?Fact Sheet for Healthcare Providers:  ?IncredibleEmployment.be ? ?This test is no t yet approved or cleared by the [place] FDA and  ?has been authorized for detection and/or diagnosis of SARS-CoV-2 by ?FDA under an Emergency Use Authorization (EUA). This EUA will remain  ?in effect (meaning this test can be used) for the duration of the ?COVID-19 declaration under Section 564(b)(1) of the Act, 21 ?U.S.C.section 360bbb-3(b)(1), unless the authorization is terminated  ?  or revoked sooner.  ? ? ?  ? Influenza A by PCR NEGATIVE NEGATIVE Final  ? Influenza B by PCR NEGATIVE NEGATIVE Final  ?  Comment: (NOTE) ?The Xpert Xpress SARS-CoV-2/FLU/RSV plus assay is intended as an aid ?in the diagnosis of influenza from Nasopharyngeal swab specimens and ?should not be used as a sole basis for treatment. Nasal washings and ?aspirates are unacceptable for Xpert Xpress SARS-CoV-2/FLU/RSV ?testing. ? ?Fact Sheet for Patients: ?EntrepreneurPulse.com.au ? ?Fact Sheet for Healthcare Providers: ?IncredibleEmployment.be ? ?This test is not yet approved or cleared by the [place] FDA and ?has been authorized for detection and/or diagnosis of SARS-CoV-2 by ?FDA under an Emergency Use Authorization (EUA). This EUA will remain ?in effect (meaning this test can be used) for the duration of the ?COVID-19 declaration under Section 564(b)(1) of the Act, 21 U.S.C. ?section  360bbb-3(b)(1), unless the authorization is terminated or ?revoked. ? ?Performed at [facility], [address], [place], [place] [address] ?  ?MRSA Next Gen by PCR, Nasal     Status: None  ? Collection Time: 09/25/21

## 2021-09-27 NOTE — Plan of Care (Signed)
?  Problem: Education: ?Goal: Knowledge of disease or condition will improve ?Outcome: Progressing ?Goal: Knowledge of the prescribed therapeutic regimen will improve ?Outcome: Progressing ?Goal: Individualized Educational Video(s) ?Outcome: Progressing ?  ?Problem: Activity: ?Goal: Ability to tolerate increased activity will improve ?Outcome: Progressing ?Goal: Will verbalize the importance of balancing activity with adequate rest periods ?Outcome: Progressing ?  ?Problem: Respiratory: ?Goal: Ability to maintain a clear airway will improve ?Outcome: Progressing ?Goal: Levels of oxygenation will improve ?Outcome: Progressing ?Goal: Ability to maintain adequate ventilation will improve ?Outcome: Progressing ?  ?Problem: Education: ?Goal: Knowledge of General Education information will improve ?Description: Including pain rating scale, medication(s)/side effects and non-pharmacologic comfort measures ?Outcome: Progressing ?  ?Problem: Health Behavior/Discharge Planning: ?Goal: Ability to manage health-related needs will improve ?Outcome: Progressing ?  ?Problem: Clinical Measurements: ?Goal: Ability to maintain clinical measurements within normal limits will improve ?Outcome: Progressing ?Goal: Will remain free from infection ?Outcome: Progressing ?Goal: Diagnostic test results will improve ?Outcome: Progressing ?Goal: Respiratory complications will improve ?Outcome: Progressing ?Goal: Cardiovascular complication will be avoided ?Outcome: Progressing ?  ?Problem: Activity: ?Goal: Risk for activity intolerance will decrease ?Outcome: Progressing ?  ?Problem: Nutrition: ?Goal: Adequate nutrition will be maintained ?Outcome: Progressing ?  ?Problem: Coping: ?Goal: Level of anxiety will decrease ?Outcome: Progressing ?  ?Problem: Elimination: ?Goal: Will not experience complications related to bowel motility ?Outcome: Progressing ?Goal: Will not experience complications related to urinary retention ?Outcome: Progressing ?   ?Problem: Pain Managment: ?Goal: General experience of comfort will improve ?Outcome: Progressing ?  ?Problem: Safety: ?Goal: Ability to remain free from injury will improve ?Outcome: Progressing ?  ?Problem: Skin Integrity: ?Goal: Risk for impaired skin integrity will decrease ?Outcome: Progressing ?  ?Problem: Safety: ?Goal: Non-violent Restraint(s) ?Outcome: Progressing ?  ?

## 2021-09-27 NOTE — Progress Notes (Addendum)
Nutrition Follow-up ? ?DOCUMENTATION CODES:  ? ?Not applicable ? ?INTERVENTION:  ? ?Osmolite 1.2 at 25 ml/h and advance 62ml every 6 hours until a goal rate of 70ml/hr (1320 ml per day) ? ?Prosource TF 45 ml once daily ?  ?Provides 1624 kcal, 84 gm protein, 1082 ml free water daily ?  ?Increase -Free water flushes to 200 ml every 6 hours ? ? ?NUTRITION DIAGNOSIS:  ? ?Inadequate oral intake related to acute illness as evidenced by NPO status. ? ?-addressed with enteral feeding ? ?GOAL:  ? ?Patient will meet greater than or equal to 90% of their needs ? ?-progressing by advancing TF to goal ? ?MONITOR:  ? ?Labs, Weight trends, TF tolerance ? ?REASON FOR ASSESSMENT:  ? ?Ventilator ?  ? ?ASSESSMENT:  ? ?Pt admitted with difficulty breathing d/t acute respiratory failure with hypoxia and hypercapnia. Pt with diagnosis of PNA 4/16. PMH significant for COPD, uncontrolled anxiety. ? ?Patient intubated 4/19 @ 1410 continues on ventilator support. MAP-85 @ 1100. Fentanyl sedation. ? ?Talked with nursing - patient tolerating tube feeding Osm 1.2 @ 25 ml/hr. Started at [place] this morning.  No immediate plans for extubation. Continue to advance enteral feeding to goal rate.   ? ? ?Intake/Output Summary (Last 24 hours) at 09/27/2021 1230 ?Last data filed at 09/27/2021 0935 ?Gross per 24 hour  ?Intake 2456.02 ml  ?Output 450 ml  ?Net 2006.02 ml  ?  ? ? ?  Latest Ref Rng & Units 09/27/2021  ?  4:20 AM 09/26/2021  ?  4:24 AM 09/25/2021  ?  3:52 AM  ?BMP  ?Glucose 70 - 99 mg/dL 156   162   143    ?BUN 8 - 23 mg/dL 43   35   27    ?Creatinine 0.44 - 1.00 mg/dL 0.86   0.93   0.83    ?Sodium 135 - 145 mmol/L 149   147   142    ?Potassium 3.5 - 5.1 mmol/L 4.9   4.9   3.7    ?Chloride 98 - 111 mmol/L 119   111   108    ?CO2 22 - 32 mmol/L 25   29   26     ?Calcium 8.9 - 10.3 mg/dL 8.7   9.8   9.0    ?  ? ?Diet Order:   ?Diet Order   ? ?       ?  Diet regular Room service appropriate? Yes; Fluid consistency: Thin  Diet effective now       ?  ? ?   ?  ? ?  ? ? ?EDUCATION NEEDS:  ? ?No education needs have been identified at this time ? ?Skin:  Skin Assessment: Reviewed RN Assessment ? ?Last BM:  4/17 ? ?Height:  ? ?Ht Readings from Last 1 Encounters:  ?09/25/21 5\' 1"  (1.549 m)  ? ? ?Weight:  ? ?Wt Readings from Last 1 Encounters:  ?09/27/21 53 kg  ? ? ?Ideal Body Weight:   48 kg ? ?BMI:  Body mass index is 22.08 kg/m?. ? ?Estimated Nutritional Needs:  ? ?Kcal:  1500-1700 ? ?Protein:  75-90g ? ?Fluid:  >/=1.5L ? ?Colman Cater MS,RD,CSG,LDN ?Office: [phone] ?Pager: [phone]  ? ? ?

## 2021-09-27 NOTE — Assessment & Plan Note (Addendum)
Resolved

## 2021-09-27 NOTE — Progress Notes (Incomplete)
*  PRELIMINARY RESULTS* ?Echocardiogram ?2D Echocardiogram has been performed. ? ?[name] ?09/27/2021, 11:06 AM ?

## 2021-09-28 ENCOUNTER — Inpatient Hospital Stay (HOSPITAL_COMMUNITY): Payer: Medicare Other

## 2021-09-28 ENCOUNTER — Ambulatory Visit: Payer: Medicare Other | Admitting: Primary Care

## 2021-09-28 ENCOUNTER — Encounter (HOSPITAL_COMMUNITY): Payer: Self-pay | Admitting: Internal Medicine

## 2021-09-28 DIAGNOSIS — I5023 Acute on chronic systolic (congestive) heart failure: Secondary | ICD-10-CM | POA: Insufficient documentation

## 2021-09-28 DIAGNOSIS — E870 Hyperosmolality and hypernatremia: Secondary | ICD-10-CM

## 2021-09-28 DIAGNOSIS — J9602 Acute respiratory failure with hypercapnia: Secondary | ICD-10-CM | POA: Diagnosis not present

## 2021-09-28 DIAGNOSIS — J9601 Acute respiratory failure with hypoxia: Secondary | ICD-10-CM | POA: Diagnosis not present

## 2021-09-28 DIAGNOSIS — I5022 Chronic systolic (congestive) heart failure: Secondary | ICD-10-CM

## 2021-09-28 DIAGNOSIS — E876 Hypokalemia: Secondary | ICD-10-CM | POA: Diagnosis not present

## 2021-09-28 DIAGNOSIS — J441 Chronic obstructive pulmonary disease with (acute) exacerbation: Secondary | ICD-10-CM | POA: Diagnosis not present

## 2021-09-28 DIAGNOSIS — I5021 Acute systolic (congestive) heart failure: Secondary | ICD-10-CM

## 2021-09-28 LAB — GLUCOSE, CAPILLARY
Glucose-Capillary: 198 mg/dL — ABNORMAL HIGH (ref 70–99)
Glucose-Capillary: 231 mg/dL — ABNORMAL HIGH (ref 70–99)
Glucose-Capillary: 189 mg/dL — ABNORMAL HIGH (ref 70–99)
Glucose-Capillary: 218 mg/dL — ABNORMAL HIGH (ref 70–99)
Glucose-Capillary: 192 mg/dL — ABNORMAL HIGH (ref 70–99)
Glucose-Capillary: 175 mg/dL — ABNORMAL HIGH (ref 70–99)
Glucose-Capillary: 130 mg/dL — ABNORMAL HIGH (ref 70–99)

## 2021-09-28 LAB — CBC
HCT: 40.8 % (ref 36.0–46.0)
Hemoglobin: 12.2 g/dL (ref 12.0–15.0)
MCH: 31.6 pg (ref 26.0–34.0)
MCHC: 29.9 g/dL — ABNORMAL LOW (ref 30.0–36.0)
MCV: 105.7 fL — ABNORMAL HIGH (ref 80.0–100.0)
Platelets: 113 10*3/uL — ABNORMAL LOW (ref 150–400)
RBC: 3.86 MIL/uL — ABNORMAL LOW (ref 3.87–5.11)
RDW: 13.6 % (ref 11.5–15.5)
WBC: 10.5 10*3/uL (ref 4.0–10.5)
nRBC: 0 % (ref 0.0–0.2)

## 2021-09-28 LAB — POCT I-STAT 7, (LYTES, BLD GAS, ICA,H+H)
Acid-Base Excess: 8 mmol/L — ABNORMAL HIGH (ref 0.0–2.0)
Bicarbonate: 34.7 mmol/L — ABNORMAL HIGH (ref 20.0–28.0)
Calcium, Ion: 1.29 mmol/L (ref 1.15–1.40)
HCT: 35 % — ABNORMAL LOW (ref 36.0–46.0)
Hemoglobin: 11.9 g/dL — ABNORMAL LOW (ref 12.0–15.0)
O2 Saturation: 98 %
Patient temperature: 98.4
Potassium: 3.9 mmol/L (ref 3.5–5.1)
Sodium: 146 mmol/L — ABNORMAL HIGH (ref 135–145)
TCO2: 37 mmol/L — ABNORMAL HIGH (ref 22–32)
pCO2 arterial: 58.6 mmHg — ABNORMAL HIGH (ref 32–48)
pH, Arterial: 7.38 (ref 7.35–7.45)
pO2, Arterial: 112 mmHg — ABNORMAL HIGH (ref 83–108)

## 2021-09-28 LAB — BASIC METABOLIC PANEL
Anion gap: 4 — ABNORMAL LOW (ref 5–15)
BUN: 44 mg/dL — ABNORMAL HIGH (ref 8–23)
CO2: 27 mmol/L (ref 22–32)
Calcium: 9 mg/dL (ref 8.9–10.3)
Chloride: 120 mmol/L — ABNORMAL HIGH (ref 98–111)
Creatinine, Ser: 0.84 mg/dL (ref 0.44–1.00)
GFR, Estimated: >60 mL/min (ref >60)
Glucose, Bld: 221 mg/dL — ABNORMAL HIGH (ref 70–99)
Potassium: 5 mmol/L (ref 3.5–5.1)
Sodium: 151 mmol/L — ABNORMAL HIGH (ref 135–145)

## 2021-09-28 LAB — HEPATIC FUNCTION PANEL
ALT: 1123 U/L — ABNORMAL HIGH (ref 0–44)
AST: 504 U/L — ABNORMAL HIGH (ref 15–41)
Albumin: 3.5 g/dL (ref 3.5–5.0)
Alkaline Phosphatase: 77 U/L (ref 38–126)
Bilirubin, Direct: 0.2 mg/dL (ref 0.0–0.2)
Indirect Bilirubin: 0.3 mg/dL (ref 0.3–0.9)
Total Bilirubin: 0.5 mg/dL (ref 0.3–1.2)
Total Protein: 6.7 g/dL (ref 6.5–8.1)

## 2021-09-28 LAB — MAGNESIUM: Magnesium: 2.7 mg/dL — ABNORMAL HIGH (ref 1.7–2.4)

## 2021-09-28 LAB — PHOSPHORUS: Phosphorus: 3.3 mg/dL (ref 2.5–4.6)

## 2021-09-28 MED ORDER — FUROSEMIDE 10 MG/ML IJ SOLN
40.0000 mg | Freq: Once | INTRAMUSCULAR | Status: AC
  Administered 2021-09-28: 40 mg via INTRAVENOUS
  Filled 2021-09-28: qty 4

## 2021-09-28 MED ORDER — INSULIN ASPART 100 UNIT/ML IJ SOLN
0.0000 [IU] | INTRAMUSCULAR | Status: DC
  Administered 2021-09-28: 2 [IU] via SUBCUTANEOUS
  Administered 2021-09-28: 3 [IU] via SUBCUTANEOUS

## 2021-09-28 MED ORDER — QUETIAPINE FUMARATE 50 MG PO TABS
50.0000 mg | ORAL_TABLET | Freq: Every day | ORAL | Status: DC
  Administered 2021-09-28: 50 mg via ORAL
  Filled 2021-09-28: qty 1

## 2021-09-28 MED ORDER — DEXTROSE 5 % IV SOLN: INTRAVENOUS | Status: DC

## 2021-09-28 MED ORDER — INSULIN ASPART 100 UNIT/ML IJ SOLN
0.0000 [IU] | INTRAMUSCULAR | Status: DC
  Administered 2021-09-28: 1 [IU] via SUBCUTANEOUS
  Administered 2021-09-28 – 2021-09-29 (×2): 2 [IU] via SUBCUTANEOUS
  Administered 2021-09-29: 1 [IU] via SUBCUTANEOUS
  Administered 2021-09-29 (×2): 2 [IU] via SUBCUTANEOUS
  Administered 2021-09-29 (×2): 1 [IU] via SUBCUTANEOUS
  Administered 2021-09-30: 3 [IU] via SUBCUTANEOUS
  Administered 2021-09-30: 1 [IU] via SUBCUTANEOUS
  Administered 2021-09-30: 3 [IU] via SUBCUTANEOUS
  Administered 2021-09-30: 2 [IU] via SUBCUTANEOUS
  Administered 2021-09-30: 3 [IU] via SUBCUTANEOUS
  Administered 2021-09-30 – 2021-10-01 (×3): 2 [IU] via SUBCUTANEOUS

## 2021-09-28 MED ORDER — CLONIDINE HCL 0.2 MG PO TABS
0.1000 mg | ORAL_TABLET | Freq: Three times a day (TID) | ORAL | Status: DC
  Administered 2021-09-28 – 2021-09-29 (×3): 0.1 mg via ORAL
  Filled 2021-09-28 (×3): qty 1

## 2021-09-28 MED ORDER — ALPRAZOLAM 0.5 MG PO TABS
0.5000 mg | ORAL_TABLET | Freq: Four times a day (QID) | ORAL | Status: DC
  Administered 2021-09-28 – 2021-09-29 (×7): 0.5 mg
  Filled 2021-09-28 (×7): qty 1

## 2021-09-28 NOTE — Consult Note (Addendum)
?Cardiology Consultation:  ? ?Patient ID: [name] ?MRN: [id number]; DOB: 1956/02/24 ? ?Admit date: 09/24/2021 ?Date of Consult: 09/28/2021 ? ?PCP:  Everardo Beals, NP ?  ?[facility] Prov[facility] ?Cardiologist:  Dorris Carnes, MD      ? ? ?Patient Profile:  ? ?[name] is a 66 y.o. 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 Carles Collet. ? ?History of Present Illness:  ? ?Ms. [name] was last examined by Dr. Harrington Challenger in 01/2021 and reported occasional dyspnea and chest pressure at that time but her chest pain was not associated with activity. It was felt her dyspnea was secondary to COPD and it was recommended to focus on risk factor modification.  ? ?She most recently presented to [facility] ED on 09/24/2021 for evaluation of worsening dyspnea for the past week and oxygen saturations had been in the 80's when checked at home. She was tachypneic and tachycardiac upon arrival and initially required BiPAP. Hs Troponin values were negative at 7 and 12. EKG showed what appears most consistent with sinus tachycardia with HR in the 130's but significant artifact is noted. She was admitted for a COPD exacerbation and started on IV steroids, Azithromycin and scheduled nebulizer treatments. Her ABG levels continued to worsen and she ultimately required intubation on 09/26/2021. PCCM was consulted to assist with vent management and her COPD exacerbation.  ? 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 She did have Grade 1 DD, normal RV function, normal PASP and no significant valve abnormalities.  ? ?At the time of this encounter, the patient is intubated and sedated. Her nephew is at the bedside and he reports she had been under increased stress prior to admission as her  husband is currently admitted to [facility] for newly diagnosed lung cancer. The patient was not very active at baseline prior to admission secondary to back pain but had been doing more around the home since her husband had been sick. No known personal history of cardiac issues but there is a family history of atrial fibrillation in her sister.  ? ? ?Past Medical History:  ?Diagnosis Date  ? Anxiety   ? Chronic back pain   ? COPD (chronic obstructive pulmonary disease) ([place])   ? Tobacco abuse   ? ? ?Past Surgical History:  ?Procedure Laterality Date  ? ABDOMINAL HYSTERECTOMY    ? BACK SURGERY    ? ILEO LOOP NEOBLADDER    ? VESICOVAGINAL FISTULA REPAIR    ?  ? ?Home Medications:  ?Prior to Admission medications   ?Medication Sig Start Date End Date Taking? Authorizing Provider  ?albuterol (VENTOLIN HFA) 108 (90 Base) MCG/ACT inhaler INHALE 2 PUFFS INTO THE LUNGS EVERY 6 HOURS AS NEEDED FOR WHEEZE/SHORTNESS OF BREATH 12/14/20  Yes Collene Gobble, MD  ?ALPRAZolam Duanne Moron) 1 MG tablet Take 0.5-1 mg by mouth daily as needed for sleep or anxiety. 03/12/19  Yes [provider]  ?Budeson-Glycopyrrol-Formoterol (BREZTRI AEROSPHERE) 160-9-4.8 MCG/ACT AERO Inhale 2 puffs into the lungs 2 (two) times daily. ?Patient taking differently: Inhale 2 puffs into the lungs 2 (two) times daily. Before lunch 12/21/20  Yes Byrum, Rose Fillers, MD  ?Budeson-Glycopyrrol-Formoterol (BREZTRI AEROSPHERE) 160-9-4.8 MCG/ACT AERO Inhale 2 puffs into the lungs in the  morning and at bedtime. 02/06/21  Yes Parrett, Tammy S, NP  ?busPIRone (BUSPAR) 10 MG tablet Take 10 mg by mouth 3 (three) times daily.   Yes [provider]  ?cefTRIAXone (ROCEPHIN) 1 g injection ceftriaxone 1 gram solution for injection ? Take 1 g every day by injection route as directed for 1 day.   Yes [provider]  ?docusate sodium (COLACE) 100 MG capsule Take 1 capsule (100 mg total) by mouth 2 (two) times daily. ?Patient taking differently: Take 100 mg by  mouth 2 (two) times daily as needed (constipation). 05/02/19  Yes Arlan Organ, DO  ?doxycycline (VIBRAMYCIN) 100 MG capsule Take 100 mg by mouth 2 (two) times daily. For 7 days started on 09/23/21   Yes [provider]  ?fluticasone (FLONASE) 50 MCG/ACT nasal spray Place 1 spray into both nostrils daily as needed for allergies.   Yes [provider]  ?ibuprofen (ADVIL) 200 MG tablet Take 400 mg by mouth every 8 (eight) hours as needed (pain.).   Yes [provider]  ?ipratropium (ATROVENT HFA) 17 MCG/ACT inhaler Inhale 2 puffs into the lungs every 4 (four) hours as needed for wheezing.   Yes [provider]  ?loratadine (CLARITIN) 10 MG tablet Take 10 mg by mouth daily as needed for allergies.   Yes [provider]  ?Magnesium 250 MG TABS Take 500 mg by mouth in the morning.   Yes [provider]  ?montelukast (SINGULAIR) 10 MG tablet Take 10 mg by mouth in the morning.   Yes [provider]  ?omeprazole (PRILOSEC) 20 MG capsule Take 20 mg by mouth daily.   Yes [provider]  ?predniSONE (STERAPRED UNI-PAK 21 TAB) 10 MG (21) TBPK tablet Take 10 mg by mouth See admin instructions. [id number] 09/22/21  Yes [provider]  ?revefenacin (YUPELRI) 175 MCG/3ML nebulizer solution Take 3 mLs (175 mcg total) by nebulization daily. 02/06/21  Yes Collene Gobble, MD  ?tretinoin (RETIN-A) 0.1 % cream Apply topically at bedtime. 08/17/21  Yes [provider]  ?triamcinolone acetonide (TRIESENCE) 40 MG/ML SUSP triamcinolone acetonide 40 mg/mL suspension for injection ? Take 80 mg every day by injection route for 1 day.   Yes [provider]  ?arformoterol (BROVANA) 15 MCG/2ML NEBU Take 2 mLs (15 mcg total) by nebulization 2 (two) times daily. ?Patient not taking: Reported on 09/24/2021 02/06/21   Collene Gobble, MD  ?rosuvastatin (CRESTOR) 5 MG tablet Take 1 tablet (5 mg total) by mouth daily. ?Patient not taking: Reported on  09/24/2021 01/22/21 09/13/23  Fay Records, MD  ?[facility] HANDIHALER 18 MCG inhalation capsule INHALE 1 CAPSULE VIA HANDIHALER ONCE DAILY AT THE SAME TIME EVERY DAY ?Patient not taking: Reported on 09/24/2021 07/17/20   Parrett, Fonnie Mu, NP  ? ? ?Inpatient Medications: ?Scheduled Meds: ? arformoterol  15 mcg Nebulization BID  ? budesonide (PULMICORT) nebulizer solution  0.5 mg Nebulization BID  ? busPIRone  10 mg Oral TID  ? chlorhexidine  15 mL Mouth Rinse BID  ? Chlorhexidine Gluconate Cloth  6 each Topical Q0600  ? docusate  100 mg Per Tube BID  ? enoxaparin (LOVENOX) injection  40 mg Subcutaneous Q24H  ? feeding supplement (PROSource TF)  45 mL Per Tube Daily  ? free water  200 mL Per Tube Q6H  ? mouth rinse  15 mL Mouth Rinse 10 times per day  ? methylPREDNISolone (SOLU-MEDROL) injection  80 mg Intravenous Q8H  ? montelukast  10 mg Oral  q AM  ? pantoprazole (PROTONIX) IV  40 mg Intravenous Q12H  ? polyethylene glycol  17 g Per Tube Daily  ? revefenacin  175 mcg Nebulization Daily  ? ?Continuous Infusions: ? sodium chloride 100 mL/hr at 09/28/21 1013  ? feeding supplement (OSMOLITE 1.2 CAL) 55 mL/hr at 09/28/21 0100  ? fentaNYL infusion INTRAVENOUS Stopped (09/28/21 1008)  ? midazolam Stopped (09/28/21 1008)  ? ?PRN Meds: ?acetaminophen **OR** acetaminophen, guaiFENesin-dextromethorphan, levalbuterol, LORazepam, polyethylene glycol ? ?Allergies:   No Known Allergies ? ?Social History:   ?Social History  ? ?Socioeconomic History  ? Marital status: Married  ?  Spouse name: Not on file  ? Number of children: Not on file  ? Years of education: Not on file  ? Highest education level: Not on file  ?Occupational History  ? Not on file  ?Tobacco Use  ? Smoking status: Former  ?  Packs/day: 2.00  ?  Years: 45.00  ?  Pack years: 90.00  ?  Types: Cigarettes  ?  Quit date: 04/11/2019  ?  Years since quitting: 2.4  ? Smokeless tobacco: Never  ?Vaping Use  ? Vaping Use: Never used  ?Substance and Sexual Activity  ? Alcohol use: No  ?  Drug use: Never  ? Sexual activity: Yes  ?Other Topics Concern  ? Not on file  ?Social History Narrative  ? Not on file  ? ?Social Determinants of Health  ? ?Financial Resource Strain: Not on file  ?Food I

## 2021-09-28 NOTE — Progress Notes (Addendum)
Sedation turned off at 1008. Pt tolerating PSV mode on vent well, vital signs wise. Oxygen level ranging from 92-95%. Pt did get very anxious when sedation was turned off. PRN Ativan given for temporary support. Will continue to monitor.  ?

## 2021-09-28 NOTE — Progress Notes (Signed)
eLink Physician-Brief Progress Note ?Patient Name: [name] ?DOB: 1955-10-08 ?MRN: [id number] ? ? ?Date of Service ? 09/28/2021  ?HPI/Events of Note ? ABG reviewed.  ?eICU Interventions ? Wean Fio2.  ? ? ? ?  ? ?Thomasene Lot [name] ?09/28/2021, 10:51 PM ?

## 2021-09-28 NOTE — Progress Notes (Signed)
After speaking with Dr. Sherene Sires, patient is planned to stay intubated due to difficulty during the weaning process. Plan is for the patient to be transferred to [facility] for critical care services that are available over the weekend.  ?

## 2021-09-28 NOTE — Progress Notes (Signed)
Report called and given to Abby, RN at [facility] 2-M.  ?

## 2021-09-28 NOTE — Progress Notes (Signed)
Patient arrived to unit via carelink. Patient vitals stable with sedation intact. Patient has restraints on wrist but not tied for transport. NP at bedside upon patient arrival stating he will ensure all orders are accurate for patient being here.  ?

## 2021-09-28 NOTE — Progress Notes (Signed)
[facility]link present to transport patient. 

## 2021-09-28 NOTE — Progress Notes (Signed)
? ?NAME:  [name], MRN:  [id number], DOB:  [birth year], LOS: 3 ?ADMISSION DATE:  09/24/2021, CONSULTATION DATE:  4/18 ?REFERRING MD:  Tat/ triad, CHIEF COMPLAINT:  resp distress   ? ?History of Present Illness:  ?66 y.o. female quit smoking 2021 with  GOLD 3 COPD MZ, uncontrolled anxiety. Was on 02 one year PTA but "took herself off" per friend at bedside and able to care for pets at home but mostly housebound sinc et[place] ? ?Patient presented to the ED with complaints of difficulty breathing, cough, wheezing x 1 week duration.  She also reported wheezing, and fatigue with O2 sats down to 78% on room air. ?She reported she was diagnosed with pneumonia one day PTA and given a shot of antibiotic continue on antibiotics and steroids. ?  ?EMS reported patient had rhonchi, O2 sats in the 80s on arrival to the ED.     ?  ?ED Course: Patient 98.8.  Heart rate 92-139.  Tachypneic with respiratory rate 33-37.  Blood pressure systolic Q000111Q to Q000111Q.  O2 sats 96 to 100% on 4 L.  O2 sats on room air not documented in ED.  Diffuse expiratory wheezing heard in ED. ?Patient was evaluated by RT in the ED and was placed on BiPAP. ?2 view chest x-ray without acute abnormality. ?IV Solu-Medrol, DuoNeb started.  Ativan 1 mg oral tablet given. ? ? ?Scheduled Meds: ? arformoterol  15 mcg Nebulization BID  ? budesonide (PULMICORT) nebulizer solution  0.5 mg Nebulization BID  ? busPIRone  10 mg Oral TID  ? chlorhexidine  15 mL Mouth Rinse BID  ? Chlorhexidine Gluconate Cloth  6 each Topical Q0600  ? docusate  100 mg Per Tube BID  ? enoxaparin (LOVENOX) injection  40 mg Subcutaneous Q24H  ? feeding supplement (PROSource TF)  45 mL Per Tube Daily  ? free water  200 mL Per Tube Q6H  ? furosemide  40 mg Intravenous Once  ? mouth rinse  15 mL Mouth Rinse 10 times per day  ? methylPREDNISolone (SOLU-MEDROL) injection  80 mg Intravenous Q8H  ? montelukast  10 mg Oral q AM  ? pantoprazole (PROTONIX) IV  40 mg Intravenous Q12H  ? polyethylene  glycol  17 g Per Tube Daily  ? revefenacin  175 mcg Nebulization Daily  ? ?Continuous Infusions: ? sodium chloride 100 mL/hr at 09/28/21 1013  ? feeding supplement (OSMOLITE 1.2 CAL) 55 mL/hr at 09/28/21 0100  ? fentaNYL infusion INTRAVENOUS Stopped (09/28/21 1008)  ? midazolam Stopped (09/28/21 1008)  ? ?PRN Meds:.acetaminophen **OR** acetaminophen, guaiFENesin-dextromethorphan, levalbuterol, LORazepam, polyethylene glycol  ? ? ?Significant Hospital Events: ?Including procedures, antibiotic start and stop dates in addition to other pertinent events   ?ET  4/18 c/b hypotension with severe air trapping  ?Echo 4/20 with EF 40%  ? ?Interim History / Subjective:  ?Changed to PSV successfully, needing sedation for comfort on psv mode  ? ?Objective   ?Blood pressure (!) 153/79, pulse 99, temperature 98.6 ?F (37 ?C), temperature source Axillary, resp. rate 12, height 5\' 1"  (1.549 m), weight 53 kg, SpO2 96 %. ?   ?Vent Mode: CPAP ?FiO2 (%):  [40 %-45 %] 40 % ?Set Rate:  [15 bmp] 15 bmp ?Vt Set:  [500 mL] 500 mL ?PEEP:  [5 cmH20] 5 cmH20 ?Pressure Support:  [10 cmH20] 10 cmH20 ?Plateau Pressure:  [16 cmH20-26 cmH20] 24 cmH20  ? ?Intake/Output Summary (Last 24 hours) at 09/28/2021 1121 ?Last data filed at 09/28/2021 1013 ?Gross per 24 hour  ?  Intake 4500.2 ml  ?Output 1100 ml  ?Net 3400.2 ml  ? ?Filed Weights  ? 09/24/21 1646 09/25/21 0816 09/27/21 0500  ?Weight: 55.4 kg 51.3 kg 53 kg  ? ? ?Examination: ?Tmax:  98.6 ?General appearance:    sedated elderly wf triggering vent over backup rate   ?  ?No jvd ?Oropharynx et/og  ?Neck supple ?Lungs very distant bs bilaterally ?RRR no s3 or or sign murmur ?Abd soft/ nl  excursion  ?Extr warm with no edema or clubbing noted ?Neuro  Sensorium still sedated ,  no apparent motor deficits  ? ? ?I personally reviewed images and   impression as follows:  ?CXR:   portable 4/21  ?No chf/ no def as dz  ? ?   ?Assessment & Plan:  ?1) acute  hypoxemic/hypercabic resp failure secondary to AECOPD in  pt with copd gold 3  AZ  ?>>>> ok to continue LABA and Add LAMA with prn saba/ high dose steroids and GI prophylaxis  ?>>> wean on PSV  ? ?2) Circulatory shock  s/p ET with air trapping on vent  ?- vent settings as above and vol expanded with NS > resolved 4/20 ?>>>  resolved and bp now high with reduced EF > lasix 40 mg IV ?>>> discussed with Dr Harrington Challenger will f/u reduced ef  ? ?3) anxiety may be problematic with weaning and likely will need low dose xanax as "maint rx" for now  ? ?4) Hypernatremia on NS infusion  ?>>> diuresis/ D5W ? ?Best Practice (right click and "Reselect all SmartList Selections" daily)  ? ?Per triad ? ?Labs   ?CBC: ?Recent Labs  ?Lab 09/24/21 ?1707 09/25/21 ?0352 09/26/21 ?0424 09/27/21 ?[id number] 09/28/21 ?0411  ?WBC 12.0* 6.8 8.7 8.8 10.5  ?NEUTROABS 9.9*  --   --   --   --   ?HGB 14.8 12.6 13.3 12.4 12.2  ?HCT 43.9 38.0 42.7 39.6 40.8  ?MCV 96.1 97.9 102.2* 104.5* 105.7*  ?PLT 162 133* 150 109* 113*  ? ? ?Basic Metabolic Panel: ?Recent Labs  ?Lab 09/24/21 ?1707 09/25/21 ?0352 09/26/21 ?0424 09/26/21 ?1658 09/27/21 ?0420 09/27/21 ?1751 09/28/21 ?0411  ?NA 137 142 147*  --  149*  --  151*  ?K 3.4* 3.7 4.9  --  4.9  --  5.0  ?CL 100 108 111  --  119*  --  120*  ?CO2 24 26 29   --  25  --  27  ?GLUCOSE 140* 143* 162*  --  156*  --  221*  ?BUN 26* 27* 35*  --  43*  --  44*  ?CREATININE 0.88 0.83 0.93  --  0.86  --  0.84  ?CALCIUM 9.6 9.0 9.8  --  8.7*  --  9.0  ?MG  --   --   --   --  2.6*  --  2.7*  ?PHOS  --   --   --  6.0* 3.4 3.1 3.3  ? ?GFR: ?Estimated Creatinine Clearance: 51.7 mL/min (by C-G formula based on SCr of 0.84 mg/dL). ?Recent Labs  ?Lab 09/24/21 ?1707 09/24/21 ?1838 09/24/21 ?2220 09/25/21 ?[id number] 09/26/21 ?0424 09/27/21 ?[id number] 09/28/21 ?0411  ?PROCALCITON  --   --   --   --   --  0.10  --   ?WBC 12.0*  --   --  6.8 8.7 8.8 10.5  ?LATICACIDVEN 1.8 2.2* 1.7  --  2.4*  --   --   ? ? ?Liver Function Tests: ?Recent Labs  ?Lab 09/24/21 ?[id number]  09/27/21 ?0420  ?AST 42* 1,229*  ?ALT 39 1,227*   ?ALKPHOS 101 64  ?BILITOT 0.7 0.7  ?PROT 8.5* 6.1*  ?ALBUMIN 4.7 3.3*  ? ?No results for input(s): LIPASE, AMYLASE in the last 168 hours. ?No results for input(s): AMMONIA in the last 168 hours. ? ?ABG ?   ?Component Value Date/Time  ? PHART 7.34 (L) 09/27/2021 0855  ? PCO2ART 49 (H) 09/27/2021 0855  ? PO2ART 87 09/27/2021 0855  ? HCO3 26.4 09/27/2021 0855  ? ACIDBASEDEF 0.2 09/27/2021 0855  ? O2SAT 99.9 09/27/2021 0855  ?  ? ?Coagulation Profile: ?No results for input(s): INR, PROTIME in the last 168 hours. ? ?Cardiac Enzymes: ?No results for input(s): CKTOTAL, CKMB, CKMBINDEX, TROPONINI in the last 168 hours. ? ?HbA1C: ?Hgb A1c MFr Bld  ?Date/Time Value Ref Range Status  ?09/25/2021 08:08 AM 5.4 4.8 - 5.6 % Final  ?  Comment:  ?  (NOTE) ?Pre diabetes:          5.7%-6.4% ? ?Diabetes:              >6.4% ? ?Glycemic control for   <7.0% ?adults with diabetes ?  ? ? ?CBG: ?Recent Labs  ?Lab 09/27/21 ?1529 09/27/21 ?1944 09/27/21 ?2332 09/28/21 ?0358 09/28/21 ?0740  ?GLUCAP 163* 176* 185* 198* 231*  ? ?     ? The patient is critically ill with multiple organ systems failure and requires high complexity decision making for assessment and support, frequent evaluation and titration of therapies, application of advanced monitoring technologies and extensive interpretation of multiple databases. Critical Care Time devoted to patient care services described in this note is 40 minutes. ? ? ?Christinia Gully, MD ?Pulmonary and Critical Care Medicine ?[place] ?Cell [phone]  ? ?After 7:00 pm call Elink  [phone]   ? ? ?

## 2021-09-28 NOTE — Progress Notes (Signed)
Inpatient Diabetes Program Recommendations ? ?AACE/ADA: New Consensus Statement on Inpatient Glycemic Control (2015) ? ?Target Ranges:  Prepandial:   less than 140 mg/dL ?     Peak postprandial:   less than 180 mg/dL (1-2 hours) ?     Critically ill patients:  140 - 180 mg/dL  ? ?Lab Results  ?Component Value Date  ? GLUCAP 189 (H) 09/28/2021  ? HGBA1C 5.4 09/25/2021  ? ? ?Review of Glycemic Control ? Latest Reference Range & Units 09/27/21 15:29 09/27/21 19:44 09/27/21 23:32 09/28/21 03:58 09/28/21 07:40 09/28/21 11:38  ? ?Diabetes history: None ?Current orders for Inpatient glycemic control:  ?Osmolite 55 ml/hr ? ?Inpatient Diabetes Program Recommendations:   ? ?Blood sugars> hospital goal.  Consider adding Novolog sensitive q 4 hours.  ? ?Thanks,  ?Adah Perl, RN, BC-ADM ?Inpatient Diabetes Coordinator ?Pager [phone]  (8a-5p) ? ? ?

## 2021-09-28 NOTE — Progress Notes (Signed)
?  ?       ?PROGRESS NOTE ? ?[name] [id number] DOB: [birth year] DOA: 09/24/2021 ?PCP: Everardo Beals, NP ? ?Brief History:  ? 66 y.o. female with medical history significant for COPD, uncontrolled anxiety. ?Patient presented to the ED with complaints of difficulty breathing, cough, wheezing of about 1 week duration.  She also reports wheezing, and fatigue.  She reports O2 sats down to 78% on room air. ?She reports she was diagnosed with pneumonia yesterday, given a shot of antibiotic continue on antibiotics and steroids. ?Despite use of inhalers, breathing continues to worsen.  No leg swelling no chest pain.  Patient has a history of anxiety and panic attacks. ?EMS reports patient had rhonchi, O2 sats in the 80s on arrival to the ED.   In the ED, patient was placed BiPAP. ?She was subsequently weaned to to [facility], but in the afternoon 4/19, she began having sob again with tachypnea for which she was placed back on BiPAP. ?ABG showed hypercarbic respiratory failure 7.09/105/232/31 on biPAP, so she was intubated.  PCCM was consulted to assist.  ? ?Assessment/Plan: ? ? ?Principal Problem: ?  Acute respiratory failure with hypoxia and hypercapnia (HCC) ?Active Problems: ?  COPD exacerbation ([place]) ?  Hypokalemia ?  Anxiety ?  GERD (gastroesophageal reflux disease) ?  Hyperglycemia ?  Lactic acidosis ?  Hypernatremia ?  Heart failure with mid-range ejection fraction (HFmEF) ([place]) ? ?Assessment and Plan: ?* Acute respiratory failure with hypoxia and hypercapnia (HCC) ?Due to COPD exacerbation ?-09/26/21 intubated ?-4/20 ABG--7.34/49/87/26 (0.4) ?-repeat CXR-personally reviewed--chronic interstitial markings ?-continue azithromycin ?-appreciate PCCM consult and follow up ?-4/21--weaning trial ? ?COPD exacerbation ([place]) ?-continue IV solumedrol ?-continue Yupelri ?-continue pulmicort and brovana ?-appreciate PCCM ?-wean to extubation ?-weaning trial 09/28/21 ? ?Heart failure with mid-range ejection fraction (HFmEF)  ([place]) ?4/20 Echo--EF 40%, AK apex and anteroseptal, G1DD ?Appreciate cardiology consult ? ?Hypernatremia ?Switch to D5W ? ?Lactic acidosis ?Driven primarily from her hypoxia and respiratory failure ?Sepsis ruled out ? ?Hyperglycemia ?- secondary to the use of his steroids ?-No prior history of diabetes ?-4/18 check A1c--5.4 ?Add novolog q 4 hours ? ?GERD (gastroesophageal reflux disease) ?- Continue PPI. ? ?Anxiety ?-Large anxiety component contributing to her dyspnea  ?-continue fentanyl and versed>>off for weaning trial ? ?Hypokalemia ?-replete ?-check mag 2.6 ? ? ? ?Family Communication:   sister updated at bedside 4/21 ?  ?Consultants:  PCCM ?  ?Code Status:  FULL  ?  ?DVT Prophylaxis:  [place] Lovenox ?  ?  ?Procedures: ?As Listed in Progress Note Above ?  ?Antibiotics: ?Azithro 4/17>>4/21 ?  ?  ?  ? ? ? ? ? ?Subjective: ?Sedated on vent.  No resp distress.  No vomiting or uncontrolled pain ? ?Objective: ?Vitals:  ? 09/28/21 1039 09/28/21 1054 09/28/21 1100 09/28/21 1130  ?BP:   (!) 163/80 (!) 182/92  ?Pulse: 99  (!) 105 (!) 114  ?Resp: 12  15 17   ?Temp:    98 ?F (36.7 ?C)  ?TempSrc:    Axillary  ?SpO2: 96% 93% 94% 93%  ?Weight:      ?Height:      ? ? ?Intake/Output Summary (Last 24 hours) at 09/28/2021 1156 ?Last data filed at 09/28/2021 1142 ?Gross per 24 hour  ?Intake 4500.2 ml  ?Output 1300 ml  ?Net 3200.2 ml  ? ?Weight change:  ?Exam: ? ?General:  Pt is alert, follows commands appropriately, not in acute distress ?HEENT: No icterus, No thrush, No neck mass, [place]/AT ?Cardiovascular: RRR, S1/S2, no  rubs, no gallops ?Respiratory: diminish BS.  Scattered wheeze ?Abdomen: Soft/+BS, non tender, non distended, no guarding ?Extremities: No edema, No lymphangitis, No petechiae, No rashes, no synovitis ? ? ?Data Reviewed: ?I have personally reviewed following labs and imaging studies ?Basic Metabolic Panel: ?Recent Labs  ?Lab 09/24/21 ?1707 09/25/21 ?0352 09/26/21 ?0424 09/26/21 ?1658 09/27/21 ?0420 09/27/21 ?1751  09/28/21 ?0411  ?NA 137 142 147*  --  149*  --  151*  ?K 3.4* 3.7 4.9  --  4.9  --  5.0  ?CL 100 108 111  --  119*  --  120*  ?CO2 24 26 29   --  25  --  27  ?GLUCOSE 140* 143* 162*  --  156*  --  221*  ?BUN 26* 27* 35*  --  43*  --  44*  ?CREATININE 0.88 0.83 0.93  --  0.86  --  0.84  ?CALCIUM 9.6 9.0 9.8  --  8.7*  --  9.0  ?MG  --   --   --   --  2.6*  --  2.7*  ?PHOS  --   --   --  6.0* 3.4 3.1 3.3  ? ?Liver Function Tests: ?Recent Labs  ?Lab 09/24/21 ?1707 09/27/21 ?0420 09/28/21 ?0411  ?AST 42* 1,229* 504*  ?ALT 39 1,227* 1,123*  ?ALKPHOS 101 64 77  ?BILITOT 0.7 0.7 0.5  ?PROT 8.5* 6.1* 6.7  ?ALBUMIN 4.7 3.3* 3.5  ? ?No results for input(s): LIPASE, AMYLASE in the last 168 hours. ?No results for input(s): AMMONIA in the last 168 hours. ?Coagulation Profile: ?No results for input(s): INR, PROTIME in the last 168 hours. ?CBC: ?Recent Labs  ?Lab 09/24/21 ?1707 09/25/21 ?0352 09/26/21 ?0424 09/27/21 ?[id number] 09/28/21 ?0411  ?WBC 12.0* 6.8 8.7 8.8 10.5  ?NEUTROABS 9.9*  --   --   --   --   ?HGB 14.8 12.6 13.3 12.4 12.2  ?HCT 43.9 38.0 42.7 39.6 40.8  ?MCV 96.1 97.9 102.2* 104.5* 105.7*  ?PLT 162 133* 150 109* 113*  ? ?Cardiac Enzymes: ?No results for input(s): CKTOTAL, CKMB, CKMBINDEX, TROPONINI in the last 168 hours. ?BNP: ?Invalid input(s): POCBNP ?CBG: ?Recent Labs  ?Lab 09/27/21 ?1944 09/27/21 ?2332 09/28/21 ?0358 09/28/21 ?0740 09/28/21 ?1138  ?GLUCAP 176* 185* 198* 231* 189*  ? ?HbA1C: ?No results for input(s): HGBA1C in the last 72 hours. ?Urine analysis: ?   ?Component Value Date/Time  ? [place] YELLOW 05/14/2019 1955  ? APPEARANCEUR CLEAR 05/14/2019 1955  ? LABSPEC 1.010 05/14/2019 1955  ? PHURINE 7.0 05/14/2019 1955  ? [place] NEGATIVE 05/14/2019 1955  ? HGBUR SMALL (A) 05/14/2019 1955  ? [place] NEGATIVE 05/14/2019 1955  ? [place] NEGATIVE 05/14/2019 1955  ? [place] NEGATIVE 05/14/2019 1955  ? UROBILINOGEN 0.2 05/11/2007 1915  ? NITRITE NEGATIVE 05/14/2019 1955  ? LEUKOCYTESUR NEGATIVE 05/14/2019  1955  ? ?Sepsis Labs: ?@LABRCNTIP (procalcitonin:4,lacticidven:4) ?) ?Recent Results (from the past 240 hour(s))  ?Resp Panel by RT-PCR (Flu A&B, Covid) Nasopharyngeal Swab     Status: None  ? Collection Time: 09/24/21  5:36 PM  ? Specimen: Nasopharyngeal Swab; Nasopharyngeal(NP) swabs in vial transport medium  ?Result Value Ref Range Status  ? SARS Coronavirus 2 by RT PCR NEGATIVE NEGATIVE Final  ?  Comment: (NOTE) ?SARS-CoV-2 target nucleic acids are NOT DETECTED. ? ?The SARS-CoV-2 RNA is generally detectable in upper respiratory ?specimens during the acute phase of infection. The lowest ?concentration of SARS-CoV-2 viral copies this assay can detect is ?138 copies/mL. A negative result does not preclude SARS-Cov-2 ?  infection and should not be used as the sole basis for treatment or ?other patient management decisions. A negative result may occur with  ?improper specimen collection/handling, submission of specimen other ?than nasopharyngeal swab, presence of viral mutation(s) within the ?areas targeted by this assay, and inadequate number of viral ?copies(<138 copies/mL). A negative result must be combined with ?clinical observations, patient history, and epidemiological ?information. The expected result is Negative. ? ?Fact Sheet for Patients:  ?EntrepreneurPulse.com.au ? ?Fact Sheet for Healthcare Providers:  ?IncredibleEmployment.be ? ?This test is no t yet approved or cleared by the [place] FDA and  ?has been authorized for detection and/or diagnosis of SARS-CoV-2 by ?FDA under an Emergency Use Authorization (EUA). This EUA will remain  ?in effect (meaning this test can be used) for the duration of the ?COVID-19 declaration under Section 564(b)(1) of the Act, 21 ?U.S.C.section 360bbb-3(b)(1), unless the authorization is terminated  ?or revoked sooner.  ? ? ?  ? Influenza A by PCR NEGATIVE NEGATIVE Final  ? Influenza B by PCR NEGATIVE NEGATIVE Final  ?  Comment:  (NOTE) ?The Xpert Xpress SARS-CoV-2/FLU/RSV plus assay is intended as an aid ?in the diagnosis of influenza from Nasopharyngeal swab specimens and ?should not be used as a sole basis for treatment. Nasal washings and ?

## 2021-09-28 NOTE — Assessment & Plan Note (Addendum)
4/20 Echo--EF 40%, AK apex and anteroseptal, G1DD BP soft precludes ARB, spironolactone  Earlier during hospitalization, patient did develop bilateral infiltrates, edema, was treated with Lasix, this resolved.  Now appears euvolemic - Continue metoprolol

## 2021-09-28 NOTE — Progress Notes (Addendum)
[facility] PCCM PROGRESS NOTE ? ?Patient received in transfer from the care of Dr. Sherene Sires at [facility] ? ?S:66 year old female with GOLD 3 COPD and anxiety. Previously on home O2 but self discontinued. Admitted 4/17 for COPD exacerbation on BiPAP. Treated with the usual nebulized bronchodilators and steroids. Ultimately failed BiPAP and required intubation on 4/18. Course complicated by severe air trapping improved with vent changes.Unfortunately she was unable to wean form the ventilator in the expected timeframe and was transferred to [facility] for further vent management on 4/21. ? ?O: ?BP 126/63   Pulse 87   Temp 98.8 ?F (37.1 ?C) (Axillary)   Resp 14   Ht 5\' 1"  (1.549 m)   Wt 53 kg   SpO2 98%   BMI 22.08 kg/m?  ? ?General:  Frail middle aged female in NAD ?Neuro:  Sedated ?HEENT:  [place]/AT, PERRL, no JVD ?Cardiovascular:  RRR, no MRG. No edema ?Lungs:  Clear, no wheeze ?Abdomen:  Soft, non-distended, hyperactive ?Musculoskeletal:  No acute deformity ?Skin:  Intact, MMM ? ? ?A/P: ? ?COPD with acute exacerbation ?Acute mixed respiratory failure ?- Continue full vent support ?- Vt currently above 8cc/kg. I suspect this is due to air trapping to allow for lower respiraoty rate. Peak pressures in the 20s so OK to keep here for now. ?- Continue brovana, budesonide, yupelri ?- Continue solumedrol ?- Daily WUA and SBT ?- Has been difficult to wean due to agitation/anxiety.  ?- Fentanyl/Versed for RASS goal -1 to -2.  ?- Start seroquel ?- Will likely need some tinkering with sedative regimen to aid vent wean. Don't want to rock the boat for tonight so will keep fent/versed.  ? ?Hypernatremia ?- continue d5 at 50 ml/hr ?- start free water flushes ?- repeat BMP in AM ? ?HFrEF: new diagnosis this admission with LVEF 40% and akinesis of the septal wall and apex.  ?- Cardiology was following at [facility], diuresing ?- Consult cards at [facility] in AM.  ? ?Steroid induced hyperglycemia ?- CBG monitoring and  SSI ? ? ?Additional critical care time:  30 minutes ? ?[facility], AGACNP-BC ?[place] [facility]/Critical Care ?Pager [phone] or [phone] ? ? ?

## 2021-09-29 DIAGNOSIS — J9602 Acute respiratory failure with hypercapnia: Secondary | ICD-10-CM | POA: Diagnosis not present

## 2021-09-29 DIAGNOSIS — J9601 Acute respiratory failure with hypoxia: Secondary | ICD-10-CM | POA: Diagnosis not present

## 2021-09-29 LAB — COMPREHENSIVE METABOLIC PANEL
ALT: 909 U/L — ABNORMAL HIGH (ref 0–44)
AST: 262 U/L — ABNORMAL HIGH (ref 15–41)
Albumin: 3 g/dL — ABNORMAL LOW (ref 3.5–5.0)
Alkaline Phosphatase: 75 U/L (ref 38–126)
Anion gap: 5 (ref 5–15)
BUN: 28 mg/dL — ABNORMAL HIGH (ref 8–23)
CO2: 31 mmol/L (ref 22–32)
Calcium: 8.8 mg/dL — ABNORMAL LOW (ref 8.9–10.3)
Chloride: 109 mmol/L (ref 98–111)
Creatinine, Ser: 0.67 mg/dL (ref 0.44–1.00)
GFR, Estimated: >60 mL/min (ref >60)
Glucose, Bld: 160 mg/dL — ABNORMAL HIGH (ref 70–99)
Potassium: 4.2 mmol/L (ref 3.5–5.1)
Sodium: 145 mmol/L (ref 135–145)
Total Bilirubin: 0.9 mg/dL (ref 0.3–1.2)
Total Protein: 5.6 g/dL — ABNORMAL LOW (ref 6.5–8.1)

## 2021-09-29 LAB — GLUCOSE, CAPILLARY
Glucose-Capillary: 126 mg/dL — ABNORMAL HIGH (ref 70–99)
Glucose-Capillary: 132 mg/dL — ABNORMAL HIGH (ref 70–99)
Glucose-Capillary: 139 mg/dL — ABNORMAL HIGH (ref 70–99)
Glucose-Capillary: 158 mg/dL — ABNORMAL HIGH (ref 70–99)
Glucose-Capillary: 193 mg/dL — ABNORMAL HIGH (ref 70–99)
Glucose-Capillary: 197 mg/dL — ABNORMAL HIGH (ref 70–99)

## 2021-09-29 LAB — MAGNESIUM: Magnesium: 2.3 mg/dL (ref 1.7–2.4)

## 2021-09-29 MED ORDER — CLONIDINE HCL 0.2 MG PO TABS: 0.1000 mg | ORAL_TABLET | Freq: Three times a day (TID) | ORAL | Status: DC

## 2021-09-29 MED ORDER — POLYETHYLENE GLYCOL 3350 17 G PO PACK
17.0000 g | PACK | Freq: Every day | ORAL | Status: DC | PRN
  Filled 2021-09-29 (×3): qty 1

## 2021-09-29 MED ORDER — MONTELUKAST SODIUM 10 MG PO TABS
10.0000 mg | ORAL_TABLET | Freq: Every morning | ORAL | Status: DC
  Administered 2021-09-30 – 2021-11-06 (×36): 10 mg
  Filled 2021-09-29 (×37): qty 1

## 2021-09-29 MED ORDER — MIDAZOLAM BOLUS VIA INFUSION
0.0000 mg | INTRAVENOUS | Status: DC | PRN
  Administered 2021-10-01: 2 mg via INTRAVENOUS
  Filled 2021-09-29: qty 5

## 2021-09-29 MED ORDER — ACETAMINOPHEN 650 MG RE SUPP
650.0000 mg | Freq: Four times a day (QID) | RECTAL | Status: DC | PRN
Start: 2021-09-29 — End: 2021-11-06

## 2021-09-29 MED ORDER — ISOSORBIDE DINITRATE 20 MG PO TABS
20.0000 mg | ORAL_TABLET | Freq: Three times a day (TID) | ORAL | Status: DC
  Filled 2021-09-29 (×2): qty 1

## 2021-09-29 MED ORDER — SENNA 8.6 MG PO TABS
1.0000 | ORAL_TABLET | Freq: Two times a day (BID) | ORAL | Status: DC
  Administered 2021-09-29: 8.6 mg
  Filled 2021-09-29 (×2): qty 1

## 2021-09-29 MED ORDER — PANTOPRAZOLE 2 MG/ML SUSPENSION
40.0000 mg | Freq: Every day | ORAL | Status: DC
  Administered 2021-09-30 – 2021-10-16 (×17): 40 mg
  Filled 2021-09-29 (×19): qty 20

## 2021-09-29 MED ORDER — QUETIAPINE FUMARATE 50 MG PO TABS
50.0000 mg | ORAL_TABLET | Freq: Every day | ORAL | Status: DC
  Administered 2021-09-29: 50 mg
  Filled 2021-09-29: qty 1

## 2021-09-29 MED ORDER — BUSPIRONE HCL 10 MG PO TABS
10.0000 mg | ORAL_TABLET | Freq: Three times a day (TID) | ORAL | Status: DC
  Administered 2021-09-29 (×2): 10 mg
  Filled 2021-09-29 (×3): qty 1

## 2021-09-29 MED ORDER — ISOSORBIDE DINITRATE 20 MG PO TABS
20.0000 mg | ORAL_TABLET | Freq: Three times a day (TID) | ORAL | Status: DC
  Administered 2021-09-29 – 2021-10-07 (×26): 20 mg
  Filled 2021-09-29 (×30): qty 1

## 2021-09-29 MED ORDER — HYDRALAZINE HCL 10 MG PO TABS
10.0000 mg | ORAL_TABLET | Freq: Three times a day (TID) | ORAL | Status: DC
  Administered 2021-09-29 – 2021-10-02 (×8): 10 mg
  Filled 2021-09-29 (×8): qty 1

## 2021-09-29 MED ORDER — HYDRALAZINE HCL 10 MG PO TABS: 10.0000 mg | ORAL_TABLET | Freq: Three times a day (TID) | ORAL | Status: DC

## 2021-09-29 MED ORDER — GUAIFENESIN-DM 100-10 MG/5ML PO SYRP
5.0000 mL | ORAL_SOLUTION | ORAL | Status: DC | PRN
  Administered 2021-10-03 – 2021-11-05 (×9): 5 mL
  Filled 2021-09-29 (×10): qty 5

## 2021-09-29 MED ORDER — FENTANYL 2500MCG IN NS 250ML (10MCG/ML) PREMIX INFUSION
0.0000 ug/h | INTRAVENOUS | Status: DC
  Administered 2021-09-30: 75 ug/h via INTRAVENOUS
  Administered 2021-10-01 (×2): 100 ug/h via INTRAVENOUS
  Administered 2021-10-02: 150 ug/h via INTRAVENOUS
  Administered 2021-10-03: 350 ug/h via INTRAVENOUS
  Administered 2021-10-03: 150 ug/h via INTRAVENOUS
  Administered 2021-10-04: 125 ug/h via INTRAVENOUS
  Administered 2021-10-04: 100 ug/h via INTRAVENOUS
  Filled 2021-09-29 (×8): qty 250

## 2021-09-29 MED ORDER — FENTANYL BOLUS VIA INFUSION
50.0000 ug | INTRAVENOUS | Status: DC | PRN
  Administered 2021-09-29 – 2021-09-30 (×4): 50 ug via INTRAVENOUS
  Administered 2021-09-30: 100 ug via INTRAVENOUS
  Administered 2021-10-01: 50 ug via INTRAVENOUS
  Administered 2021-10-01 – 2021-10-03 (×10): 100 ug via INTRAVENOUS
  Administered 2021-10-03: 50 ug via INTRAVENOUS
  Administered 2021-10-03: 100 ug via INTRAVENOUS
  Administered 2021-10-03: 50 ug via INTRAVENOUS
  Administered 2021-10-03 (×2): 100 ug via INTRAVENOUS
  Administered 2021-10-04 (×2): 50 ug via INTRAVENOUS
  Administered 2021-10-04 (×2): 100 ug via INTRAVENOUS
  Administered 2021-10-04: 50 ug via INTRAVENOUS
  Filled 2021-09-29: qty 100

## 2021-09-29 MED ORDER — ACETAMINOPHEN 325 MG PO TABS
650.0000 mg | ORAL_TABLET | Freq: Four times a day (QID) | ORAL | Status: DC | PRN
  Administered 2021-10-01 – 2021-11-05 (×13): 650 mg
  Filled 2021-09-29 (×15): qty 2

## 2021-09-29 NOTE — Progress Notes (Signed)
? ?NAME:  [name], MRN:  [id number], DOB:  1956-05-22, LOS: 4 ?ADMISSION DATE:  09/24/2021, CONSULTATION DATE:  4/18 ?REFERRING MD:  Tat/ triad, CHIEF COMPLAINT:  resp distress   ? ?History of Present Illness:  ?66 y.o. female quit smoking 2021 with  GOLD 3 COPD MZ, uncontrolled anxiety. Was on 02 one year PTA but "took herself off" per friend at bedside and able to care for pets at home but mostly housebound sinc ethen ? ?Patient presented to the ED with complaints of difficulty breathing, cough, wheezing x 1 week duration.  She also reported wheezing, and fatigue with O2 sats down to 78% on room air. ?She reported she was diagnosed with pneumonia one day PTA and given a shot of antibiotic continue on antibiotics and steroids. ?  ?EMS reported patient had rhonchi, O2 sats in the 80s on arrival to the ED.     ?  ?ED Course: Patient 98.8.  Heart rate 92-139.  Tachypneic with respiratory rate 33-37.  Blood pressure systolic Q000111Q to Q000111Q.  O2 sats 96 to 100% on 4 L.  O2 sats on room air not documented in ED.  Diffuse expiratory wheezing heard in ED. ?Patient was evaluated by RT in the ED and was placed on BiPAP. ?2 view chest x-ray without acute abnormality. ?IV Solu-Medrol, DuoNeb started.  Ativan 1 mg oral tablet given. ? ? ?Scheduled Meds: ? ALPRAZolam  0.5 mg Per Tube QID  ? arformoterol  15 mcg Nebulization BID  ? budesonide (PULMICORT) nebulizer solution  0.5 mg Nebulization BID  ? busPIRone  10 mg Oral TID  ? chlorhexidine  15 mL Mouth Rinse BID  ? Chlorhexidine Gluconate Cloth  6 each Topical Q0600  ? cloNIDine  0.1 mg Oral TID  ? docusate  100 mg Per Tube BID  ? enoxaparin (LOVENOX) injection  40 mg Subcutaneous Q24H  ? feeding supplement (PROSource TF)  45 mL Per Tube Daily  ? free water  200 mL Per Tube Q6H  ? insulin aspart  0-9 Units Subcutaneous Q4H  ? mouth rinse  15 mL Mouth Rinse 10 times per day  ? methylPREDNISolone (SOLU-MEDROL) injection  80 mg Intravenous Q8H  ? montelukast  10 mg Oral q AM   ? pantoprazole (PROTONIX) IV  40 mg Intravenous Q12H  ? polyethylene glycol  17 g Per Tube Daily  ? QUEtiapine  50 mg Oral QHS  ? revefenacin  175 mcg Nebulization Daily  ? ?Continuous Infusions: ? dextrose 50 mL/hr at 09/29/21 0800  ? feeding supplement (OSMOLITE 1.2 CAL) 55 mL/hr at 09/28/21 0100  ? fentaNYL infusion INTRAVENOUS 75 mcg/hr (09/29/21 0800)  ? midazolam 3 mg/hr (09/29/21 0800)  ? ?PRN Meds:.acetaminophen **OR** acetaminophen, guaiFENesin-dextromethorphan, levalbuterol, LORazepam, polyethylene glycol  ? ? ?Significant Hospital Events: ?Including procedures, antibiotic start and stop dates in addition to other pertinent events   ?ET  4/18 c/b hypotension with severe air trapping  ?Echo 4/20 with EF 40%  ? ?Interim History / Subjective:  ?No overnight events sedated ?Sedated ? ?Objective   ?Blood pressure 120/69, pulse 76, temperature (!) 97.5 ?F (36.4 ?C), temperature source Oral, resp. rate 13, height 5\' 1"  (1.549 m), weight 58 kg, SpO2 97 %. ?   ?Vent Mode: PRVC ?FiO2 (%):  [30 %-50 %] 40 % ?Set Rate:  [15 bmp] 15 bmp ?Vt Set:  [500 mL] 500 mL ?PEEP:  [5 cmH20] 5 cmH20 ?Pressure Support:  [5 cmH20-10 cmH20] 10 cmH20 ?Plateau Pressure:  [16 [id number]  cmH20  ? ?Intake/Output Summary (Last 24 hours) at 09/29/2021 0824 ?Last data filed at 09/29/2021 0800 ?Gross per 24 hour  ?Intake 2603.06 ml  ?Output 1650 ml  ?Net 953.06 ml  ? ?Filed Weights  ? 09/25/21 0816 09/27/21 0500 09/29/21 0500  ?Weight: 51.3 kg 53 kg 58 kg  ? ? ?Examination: ?General-middle-aged, does not appear to be in distress, chronically ill-appearing ?HEENT-dry oral mucosa, endotracheal tube in place  ?neck -no masses, trachea is midline ?Respiratory -decreased air movement bilaterally  ?CV -S1-S2 appreciated, no murmur  ?GI - soft, non tender, no masses, no hepatosplenomegaly.  Bowel sounds appreciated ?Neuro -sedated ? ?Chest x-ray 4/21-no acute infiltrate ?   ?Assessment & Plan:  ?Acute hypoxemic/hypercarbic respiratory  failure ?Gold stage III COPD ?-Continue full ventilator support ?-Trend arterial blood gases ?-Continue mechanical ventilation  ?-Target TVol 6-8cc/kgIBW ?-Target Plateau Pressure < 30cm H20 ?-Ventilator associated pneumonia prevention protocol ? ?Chronic obstructive pulmonary disease ?-Continue Brovana, Yupelri ?-Pulmicort ?-Solu-Medrol ? ?Significant history of anxiety ?-On Seroquel, Xanax ?-BuSpar ? ?Hyponatremia ?-Free water ? ?Agitation and ventilated patient ?-On fentanyl and Versed ?-Will wean ? ?Heart failure with reduced ejection fraction ?-Left ventricle ejection fraction of 40% with akinesis of the septal wall and apex ?-Plan for cardiology was to see again on Monday ?-Optimize fluid status ?-Cautious diuresis ?-Consult cardiology ? ?Spoke with patient's sister at bedside ? ?Best Practice (right click and "Reselect all SmartList Selections" daily)  ? ?On PPI ?DVT prophylaxis ? ?Labs   ?CBC: ?Recent Labs  ?Lab 09/24/21 ?1707 09/25/21 ?0352 09/26/21 ?0424 09/27/21 ?0420 09/28/21 ?0411 09/28/21 ?2054  ?WBC 12.0* 6.8 8.7 8.8 10.5  --   ?NEUTROABS 9.9*  --   --   --   --   --   ?HGB 14.8 12.6 13.3 12.4 12.2 11.9*  ?HCT 43.9 38.0 42.7 39.6 40.8 35.0*  ?MCV 96.1 97.9 102.2* 104.5* 105.7*  --   ?PLT 162 133* 150 109* 113*  --   ? ? ?Basic Metabolic Panel: ?Recent Labs  ?Lab 09/24/21 ?1707 09/25/21 ?0352 09/26/21 ?0424 09/26/21 ?1658 09/27/21 ?0420 09/27/21 ?1751 09/28/21 ?0411 09/28/21 ?2054  ?NA 137 142 147*  --  149*  --  151* 146*  ?K 3.4* 3.7 4.9  --  4.9  --  5.0 3.9  ?CL 100 108 111  --  119*  --  120*  --   ?CO2 24 26 29   --  25  --  27  --   ?GLUCOSE 140* 143* 162*  --  156*  --  221*  --   ?BUN 26* 27* 35*  --  43*  --  44*  --   ?CREATININE 0.88 0.83 0.93  --  0.86  --  0.84  --   ?CALCIUM 9.6 9.0 9.8  --  8.7*  --  9.0  --   ?MG  --   --   --   --  2.6*  --  2.7*  --   ?PHOS  --   --   --  6.0* 3.4 3.1 3.3  --   ? ?GFR: ?Estimated Creatinine Clearance: 56.2 mL/min (by C-G formula based on SCr of 0.84  mg/dL). ?Recent Labs  ?Lab 09/24/21 ?1707 09/24/21 ?1838 09/24/21 ?2220 09/25/21 ?[id number] 09/26/21 ?0424 09/27/21 ?[id number] 09/28/21 ?0411  ?PROCALCITON  --   --   --   --   --  0.10  --   ?WBC 12.0*  --   --  6.8 8.7 8.8 10.5  ?LATICACIDVEN 1.8 2.2*  1.7  --  2.4*  --   --   ? ? ?Liver Function Tests: ?Recent Labs  ?Lab 09/24/21 ?1707 09/27/21 ?0420 09/28/21 ?0411  ?AST 42* 1,229* 504*  ?ALT 39 1,227* 1,123*  ?ALKPHOS 101 64 77  ?BILITOT 0.7 0.7 0.5  ?PROT 8.5* 6.1* 6.7  ?ALBUMIN 4.7 3.3* 3.5  ? ?No results for input(s): LIPASE, AMYLASE in the last 168 hours. ?No results for input(s): AMMONIA in the last 168 hours. ? ?ABG ?   ?Component Value Date/Time  ? PHART 7.380 09/28/2021 2054  ? PCO2ART 58.6 (H) 09/28/2021 2054  ? PO2ART 112 (H) 09/28/2021 2054  ? HCO3 34.7 (H) 09/28/2021 2054  ? TCO2 37 (H) 09/28/2021 2054  ? ACIDBASEDEF 0.2 09/27/2021 0855  ? O2SAT 98 09/28/2021 2054  ?  ? ?Coagulation Profile: ?No results for input(s): INR, PROTIME in the last 168 hours. ? ?Cardiac Enzymes: ?No results for input(s): CKTOTAL, CKMB, CKMBINDEX, TROPONINI in the last 168 hours. ? ?HbA1C: ?Hgb A1c MFr Bld  ?Date/Time Value Ref Range Status  ?09/25/2021 08:08 AM 5.4 4.8 - 5.6 % Final  ?  Comment:  ?  (NOTE) ?Pre diabetes:          5.7%-6.4% ? ?Diabetes:              >6.4% ? ?Glycemic control for   <7.0% ?adults with diabetes ?  ? ? ?CBG: ?Recent Labs  ?Lab 09/28/21 ?1812 09/28/21 ?1931 09/28/21 ?2317 09/29/21 ?[id number] 09/29/21 ?0710  ?GLUCAP 192* 175* 130* 139* 126*  ? ?The patient is critically ill with multiple organ systems failure and requires high complexity decision making for assessment and support, frequent evaluation and titration of therapies, application of advanced monitoring technologies and extensive interpretation of multiple databases. Critical Care Time devoted to patient care services described in this note independent of APP/resident time (if applicable)  is 31 minutes.  ? ?Sherrilyn Rist MD ?[facility] ?Personal pager: See Shea Evans ?If unanswered, please page ?CCM On-call: [phone] ?

## 2021-09-29 NOTE — Progress Notes (Signed)
? ?Progress Note ? ?Patient Name: [name] ?Date of Encounter: 09/29/2021 ? ?Primary Cardiologist:   Dietrich Pates, MD ? ? ?Subjective  ? ?Intubated and sedated  ? ?Inpatient Medications  ?  ?Scheduled Meds: ? ALPRAZolam  0.5 mg Per Tube QID  ? arformoterol  15 mcg Nebulization BID  ? budesonide (PULMICORT) nebulizer solution  0.5 mg Nebulization BID  ? busPIRone  10 mg Per Tube TID  ? chlorhexidine  15 mL Mouth Rinse BID  ? Chlorhexidine Gluconate Cloth  6 each Topical Q0600  ? cloNIDine  0.1 mg Per Tube TID  ? docusate  100 mg Per Tube BID  ? enoxaparin (LOVENOX) injection  40 mg Subcutaneous Q24H  ? feeding supplement (PROSource TF)  45 mL Per Tube Daily  ? free water  200 mL Per Tube Q6H  ? insulin aspart  0-9 Units Subcutaneous Q4H  ? mouth rinse  15 mL Mouth Rinse 10 times per day  ? methylPREDNISolone (SOLU-MEDROL) injection  80 mg Intravenous Q8H  ? [START ON 09/30/2021] montelukast  10 mg Per Tube q AM  ? [START ON 09/30/2021] pantoprazole sodium  40 mg Per Tube Daily  ? polyethylene glycol  17 g Per Tube Daily  ? QUEtiapine  50 mg Per Tube QHS  ? revefenacin  175 mcg Nebulization Daily  ? ?Continuous Infusions: ? dextrose 50 mL/hr at 09/29/21 1100  ? feeding supplement (OSMOLITE 1.2 CAL) 1,000 mL (09/29/21 1029)  ? fentaNYL infusion INTRAVENOUS    ? midazolam 3 mg/hr (09/29/21 1100)  ? ?PRN Meds: ?acetaminophen **OR** acetaminophen, guaiFENesin-dextromethorphan, levalbuterol, LORazepam, polyethylene glycol  ? ?Vital Signs  ?  ?Vitals:  ? 09/29/21 1100 09/29/21 1105 09/29/21 1106 09/29/21 1123  ?BP: 115/65     ?Pulse: 77 77    ?Resp: 14 18    ?Temp:    (!) 97.5 ?F (36.4 ?C)  ?TempSrc:    Oral  ?SpO2: 96% 96% 96%   ?Weight:      ?Height:      ? ? ?Intake/Output Summary (Last 24 hours) at 09/29/2021 1132 ?Last data filed at 09/29/2021 1100 ?Gross per 24 hour  ?Intake 2372.02 ml  ?Output 1650 ml  ?Net 722.02 ml  ? ?Filed Weights  ? 09/25/21 0816 09/27/21 0500 09/29/21 0500  ?Weight: 51.3 kg 53 kg 58 kg   ? ? ?Telemetry  ?  ?NSR - Personally Reviewed ? ?ECG  ?  ?NA - Personally Reviewed ? ?Physical Exam  ? ?GEN: No acute distress.   ?Neck: No  JVD ?Cardiac: RRR, no murmurs, rubs, or gallops.  ?Respiratory:      Decreased breath sounds ?GI: Soft, nontender, non-distended  ?MS: No  edema; No deformity. ?Neuro:  Nonfocal  ?Psych: Normal affect  ? ?Labs  ?  ?Chemistry ?Recent Labs  ?Lab 09/24/21 ?1707 09/25/21 ?0352 09/26/21 ?0424 09/27/21 ?0420 09/28/21 ?0411 09/28/21 ?2054  ?NA 137   < > 147* 149* 151* 146*  ?K 3.4*   < > 4.9 4.9 5.0 3.9  ?CL 100   < > 111 119* 120*  --   ?CO2 24   < > 29 25 27   --   ?GLUCOSE 140*   < > 162* 156* 221*  --   ?BUN 26*   < > 35* 43* 44*  --   ?CREATININE 0.88   < > 0.93 0.86 0.84  --   ?CALCIUM 9.6   < > 9.8 8.7* 9.0  --   ?PROT 8.5*  --   --  6.1* 6.7  --   ?ALBUMIN 4.7  --   --  3.3* 3.5  --   ?AST 42*  --   --  1,229* 504*  --   ?ALT 39  --   --  1,227* 1,123*  --   ?ALKPHOS 101  --   --  64 77  --   ?BILITOT 0.7  --   --  0.7 0.5  --   ?GFRNONAA >60   < > >60 >60 >60  --   ?ANIONGAP 13   < > 7 5 4*  --   ? < > = values in this interval not displayed.  ?  ? ?Hematology ?Recent Labs  ?Lab 09/26/21 ?0424 09/27/21 ?0420 09/28/21 ?0411 09/28/21 ?2054  ?WBC 8.7 8.8 10.5  --   ?RBC 4.18 3.79* 3.86*  --   ?HGB 13.3 12.4 12.2 11.9*  ?HCT 42.7 39.6 40.8 35.0*  ?MCV 102.2* 104.5* 105.7*  --   ?MCH 31.8 32.7 31.6  --   ?MCHC 31.1 31.3 29.9*  --   ?RDW 13.4 13.4 13.6  --   ?PLT 150 109* 113*  --   ? ? ?Cardiac EnzymesNo results for input(s): TROPONINI in the last 168 hours. No results for input(s): TROPIPOC in the last 168 hours.  ? ?BNP ?Recent Labs  ?Lab 09/27/21 ?0420  ?BNP 1,052.0*  ?  ? ?DDimer No results for input(s): DDIMER in the last 168 hours.  ? ?Radiology  ?  ?DG Chest Port 1 View ? ?Result Date: 09/28/2021 ?CLINICAL DATA:  History of COPD. Difficulty breathing, cough, and wheezing for 1 week. EXAM: PORTABLE CHEST 1 VIEW COMPARISON:  Chest radiograph 09/26/2021; CT chest 11/22/2020  FINDINGS: Hyperinflation of the lungs. Upper lobe predominant emphysematous changes and reticulation throughout the lung bases. Small patchy airspace opacity at the right lung base. Stable apical pleural thickening, right-greater-than-left. No pneumothorax. Heart is normal in size. Endotracheal tube tip projects 4 cm above the carina. Enteric tube tip is below the diaphragm, projecting at the expected location of the gastric body. Visualized skeletal structures are unremarkable. IMPRESSION: Support lines and tubes are in appropriate position. Small patchy airspace opacity at the right lung base may reflect subsegmental atelectasis, aspiration changes, or possibly infection in the appropriate clinical context. Emphysema (ICD10-J43.9). Electronically Signed   By: Ileana Roup M.D.   On: 09/28/2021 08:37   ? ?Cardiac Studies  ? ? ?Echo:  4/202/23 ? ?1. Limited visualization of the myocardium, incomplete assessment of wall  ?motion. Anteroseptal wall is akinetic. Apex is akinetic. Marland Kitchen Left  ?ventricular ejection fraction, by estimation, is 40%. The left ventricle  ?has mildly decreased function. The left  ?ventricle demonstrates regional wall motion abnormalities (see scoring  ?diagram/findings for description). Left ventricular diastolic parameters  ?are consistent with Grade I diastolic dysfunction (impaired relaxation).  ? 2. Right ventricular systolic function is normal. The right ventricular  ?size is normal. There is normal pulmonary artery systolic pressure.  ? 3. The mitral valve is normal in structure. No evidence of mitral valve  ?regurgitation. No evidence of mitral stenosis.  ? 4. The tricuspid valve is abnormal.  ? 5. The aortic valve is tricuspid. Aortic valve regurgitation is not  ?visualized. No aortic stenosis is present.  ? 6. The inferior vena cava is normal in size with greater than 50%  ?respiratory variability, suggesting right atrial pressure of 3 mmHg.  ? ?Patient Profile  ?   ?66 y.o. 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 Carles Collet. ? ?Assessment & Plan  ?  ?HFmrEF:    Transferred to [facility] for vent management.  Unable to wean from vent.   Plan ultimately is probably left heart cath given new decreased EF and wall motion abnormalities.  However, this can wait improvement in acute issues as it is not likely the primary factor limiting her vent weaning.   Net positive 5.4 liters.  Labile BP .  Stop clonidine and start  Hydral/nitrates.     ? ?Acute Hypoxic Respiratory Failure:    Vent management per CCM.    ? ?HLD:  Holding Crestor with increased LFT.   ?  ?Transaminitis:  Follow.   ? ? ?For questions or updates, please contact [place] ?Please consult [email or web address] for contact info under Cardiology/STEMI. ?  ?Signed, ?Minus Breeding, MD  ?09/29/2021, 11:32 AM   ? ?

## 2021-09-30 DIAGNOSIS — J9602 Acute respiratory failure with hypercapnia: Secondary | ICD-10-CM | POA: Diagnosis not present

## 2021-09-30 DIAGNOSIS — J9601 Acute respiratory failure with hypoxia: Secondary | ICD-10-CM | POA: Diagnosis not present

## 2021-09-30 LAB — BASIC METABOLIC PANEL
Anion gap: 9 (ref 5–15)
BUN: 30 mg/dL — ABNORMAL HIGH (ref 8–23)
CO2: 28 mmol/L (ref 22–32)
Calcium: 8.9 mg/dL (ref 8.9–10.3)
Chloride: 104 mmol/L (ref 98–111)
Creatinine, Ser: 0.61 mg/dL (ref 0.44–1.00)
GFR, Estimated: >60 mL/min (ref >60)
Glucose, Bld: 275 mg/dL — ABNORMAL HIGH (ref 70–99)
Potassium: 4.9 mmol/L (ref 3.5–5.1)
Sodium: 141 mmol/L (ref 135–145)

## 2021-09-30 LAB — HEPATIC FUNCTION PANEL
ALT: 739 U/L — ABNORMAL HIGH (ref 0–44)
AST: 126 U/L — ABNORMAL HIGH (ref 15–41)
Albumin: 3.1 g/dL — ABNORMAL LOW (ref 3.5–5.0)
Alkaline Phosphatase: 94 U/L (ref 38–126)
Bilirubin, Direct: 0.3 mg/dL — ABNORMAL HIGH (ref 0.0–0.2)
Indirect Bilirubin: 0.3 mg/dL (ref 0.3–0.9)
Total Bilirubin: 0.6 mg/dL (ref 0.3–1.2)
Total Protein: 5.7 g/dL — ABNORMAL LOW (ref 6.5–8.1)

## 2021-09-30 LAB — CBC WITH DIFFERENTIAL/PLATELET
Abs Immature Granulocytes: 0.14 10*3/uL — ABNORMAL HIGH (ref 0.00–0.07)
Basophils Absolute: 0 10*3/uL (ref 0.0–0.1)
Basophils Relative: 0 %
Eosinophils Absolute: 0 10*3/uL (ref 0.0–0.5)
Eosinophils Relative: 0 %
HCT: 36.3 % (ref 36.0–46.0)
Hemoglobin: 11.7 g/dL — ABNORMAL LOW (ref 12.0–15.0)
Immature Granulocytes: 1 %
Lymphocytes Relative: 4 %
Lymphs Abs: 0.5 10*3/uL — ABNORMAL LOW (ref 0.7–4.0)
MCH: 32.8 pg (ref 26.0–34.0)
MCHC: 32.2 g/dL (ref 30.0–36.0)
MCV: 101.7 fL — ABNORMAL HIGH (ref 80.0–100.0)
Monocytes Absolute: 0.6 10*3/uL (ref 0.1–1.0)
Monocytes Relative: 5 %
Neutro Abs: 11.9 10*3/uL — ABNORMAL HIGH (ref 1.7–7.7)
Neutrophils Relative %: 90 %
Platelets: 84 10*3/uL — ABNORMAL LOW (ref 150–400)
RBC: 3.57 MIL/uL — ABNORMAL LOW (ref 3.87–5.11)
RDW: 13 % (ref 11.5–15.5)
WBC: 13.2 10*3/uL — ABNORMAL HIGH (ref 4.0–10.5)
nRBC: 0 % (ref 0.0–0.2)

## 2021-09-30 LAB — GLUCOSE, CAPILLARY
Glucose-Capillary: 249 mg/dL — ABNORMAL HIGH (ref 70–99)
Glucose-Capillary: 222 mg/dL — ABNORMAL HIGH (ref 70–99)
Glucose-Capillary: 208 mg/dL — ABNORMAL HIGH (ref 70–99)
Glucose-Capillary: 146 mg/dL — ABNORMAL HIGH (ref 70–99)
Glucose-Capillary: 177 mg/dL — ABNORMAL HIGH (ref 70–99)

## 2021-09-30 MED ORDER — FUROSEMIDE 10 MG/ML IJ SOLN
40.0000 mg | Freq: Once | INTRAMUSCULAR | Status: AC
  Administered 2021-09-30: 40 mg via INTRAVENOUS
  Filled 2021-09-30: qty 4

## 2021-09-30 MED ORDER — QUETIAPINE FUMARATE 50 MG PO TABS
50.0000 mg | ORAL_TABLET | Freq: Two times a day (BID) | ORAL | Status: DC
  Administered 2021-09-30 – 2021-10-03 (×7): 50 mg
  Filled 2021-09-30 (×7): qty 1

## 2021-09-30 MED ORDER — METHYLPREDNISOLONE SODIUM SUCC 125 MG IJ SOLR
40.0000 mg | Freq: Two times a day (BID) | INTRAMUSCULAR | Status: DC
  Administered 2021-09-30 – 2021-10-04 (×8): 40 mg via INTRAVENOUS
  Filled 2021-09-30 (×8): qty 2

## 2021-09-30 MED ORDER — BUSPIRONE HCL 15 MG PO TABS
15.0000 mg | ORAL_TABLET | Freq: Three times a day (TID) | ORAL | Status: DC
Start: 2021-09-30 — End: 2021-10-02
  Administered 2021-09-30 – 2021-10-01 (×6): 15 mg
  Filled 2021-09-30 (×7): qty 1

## 2021-09-30 MED ORDER — CLONAZEPAM 1 MG PO TABS
1.0000 mg | ORAL_TABLET | Freq: Two times a day (BID) | ORAL | Status: DC
  Administered 2021-09-30 – 2021-10-01 (×4): 1 mg
  Filled 2021-09-30 (×5): qty 1

## 2021-09-30 NOTE — Progress Notes (Signed)
? ?NAME:  [name], MRN:  [id number], DOB:  Apr 18, 1956, LOS: 5 ?ADMISSION DATE:  09/24/2021, CONSULTATION DATE:  4/18 ?REFERRING MD:  Tat/ triad, CHIEF COMPLAINT:  resp distress   ? ?History of Present Illness:  ?66 y.o. female quit smoking 2021 with  GOLD 3 COPD MZ, uncontrolled anxiety. Was on 02 one year PTA but "took herself off" per friend at bedside and able to care for pets at home but mostly housebound sinc ethen ? ?Patient presented to the ED with complaints of difficulty breathing, cough, wheezing x 1 week duration.  She also reported wheezing, and fatigue with O2 sats down to 78% on room air. ?She reported she was diagnosed with pneumonia one day PTA and given a shot of antibiotic continue on antibiotics and steroids. ?  ?EMS reported patient had rhonchi, O2 sats in the 80s on arrival to the ED.     ?  ?ED Course: Patient 98.8.  Heart rate 92-139.  Tachypneic with respiratory rate 33-37.  Blood pressure systolic Q000111Q to Q000111Q.  O2 sats 96 to 100% on 4 L.  O2 sats on room air not documented in ED.  Diffuse expiratory wheezing heard in ED. ?Patient was evaluated by RT in the ED and was placed on BiPAP. ?2 view chest x-ray without acute abnormality. ?IV Solu-Medrol, DuoNeb started.  Ativan 1 mg oral tablet given. ? ? ?Scheduled Meds: ? ALPRAZolam  0.5 mg Per Tube QID  ? arformoterol  15 mcg Nebulization BID  ? budesonide (PULMICORT) nebulizer solution  0.5 mg Nebulization BID  ? busPIRone  15 mg Per Tube TID  ? chlorhexidine  15 mL Mouth Rinse BID  ? Chlorhexidine Gluconate Cloth  6 each Topical Q0600  ? docusate  100 mg Per Tube BID  ? enoxaparin (LOVENOX) injection  40 mg Subcutaneous Q24H  ? feeding supplement (PROSource TF)  45 mL Per Tube Daily  ? free water  200 mL Per Tube Q6H  ? hydrALAZINE  10 mg Per Tube Q8H  ? insulin aspart  0-9 Units Subcutaneous Q4H  ? isosorbide dinitrate  20 mg Per Tube TID  ? mouth rinse  15 mL Mouth Rinse 10 times per day  ? methylPREDNISolone (SOLU-MEDROL) injection  40  mg Intravenous Q12H  ? montelukast  10 mg Per Tube q AM  ? pantoprazole sodium  40 mg Per Tube Daily  ? polyethylene glycol  17 g Per Tube Daily  ? QUEtiapine  50 mg Per Tube BID  ? revefenacin  175 mcg Nebulization Daily  ? senna  1 tablet Per Tube BID  ? ?Continuous Infusions: ? dextrose 50 mL/hr at 09/30/21 0600  ? feeding supplement (OSMOLITE 1.2 CAL) 1,000 mL (09/29/21 1858)  ? fentaNYL infusion INTRAVENOUS 75 mcg/hr (09/30/21 0600)  ? midazolam 4 mg/hr (09/30/21 0600)  ? ?PRN Meds:.acetaminophen **OR** acetaminophen, fentaNYL, guaiFENesin-dextromethorphan, levalbuterol, midazolam, polyethylene glycol  ? ? ?Significant Hospital Events: ?Including procedures, antibiotic start and stop dates in addition to other pertinent events   ?ET  4/18 c/b hypotension with severe air trapping  ?Echo 4/20 with EF 40%  ? ?Interim History / Subjective:  ?No overnight events sedated ?Sedated ?Attempts at weaning sedation still limited by significant agitation and anxiety ? ?Objective   ?Blood pressure (!) 143/70, pulse (!) 102, temperature (!) 97.2 ?F (36.2 ?C), temperature source Axillary, resp. rate 17, height 5\' 1"  (1.549 m), weight 58.4 kg, SpO2 96 %. ?   ?Vent Mode: PRVC ?FiO2 (%):  [40 %] 40 % ?  Set Rate:  [15 bmp] 15 bmp ?Vt Set:  [500 mL] 500 mL ?PEEP:  [5 cmH20] 5 cmH20 ?Plateau Pressure:  [17 cmH20-29 cmH20] 28 cmH20  ? ?Intake/Output Summary (Last 24 hours) at 09/30/2021 0747 ?Last data filed at 09/30/2021 0600 ?Gross per 24 hour  ?Intake 2704.22 ml  ?Output 1050 ml  ?Net 1654.22 ml  ? ?Filed Weights  ? 09/27/21 0500 09/29/21 0500 09/30/21 0500  ?Weight: 53 kg 58 kg 58.4 kg  ? ? ?Examination: ?General-middle-aged, chronically ill-appearing ?HEENT-dry oral mucosa with endotracheal tube in place ?neck -no masses, trachea is midline ?Respiratory -decreased air movement bilaterally, no wheezing ?CV -S1-S2 appreciated with no murmur ?GI -bowel sounds appreciated ?Neuro -sedated ? ?Chest x-ray 4/21-no acute infiltrate ?ABG on  4/21 noted ?   ?Assessment & Plan:  ? ?Acute hypoxemic/hypercarbic respiratory failure ?Gold stage III COPD ?-Continue full ventilator support ?-Continue mechanical ventilation  ?-Target TVol 6-8cc/kgIBW ?-Target Plateau Pressure < 30cm H20 ?-Ventilator associated pneumonia prevention protocol ? ?Chronic obstructive pulmonary disease ?-Continue Brovana, Yupelri ?-Continue Pulmicort, ?-Dose of Solu-Medrol decreased to 40 every 12 ? ?Significant history of anxiety ?-On Seroquel, Xanax, dose of Seroquel increased ?-BuSpar-dose increased to 15 3 times daily ? ?Transaminitis ?-Improving ALT/AST ? ?Hypernatremia ?-Continue free water ? ?Agitation in a ventilated patient ?-On fentanyl and Versed ?-RASS goal of 0 ?-Continue to wean as tolerated ? ?Heart failure with reduced ejection fraction ?-Appreciate cardiology follow-up ?-Continue to optimize fluid status ?-Cautious diuresis ?-Still about 7 L positive ?-We will give dose of Lasix today 40 mg x 1 ? ?Leukocytosis ?-Likely related to steroids ? ?Thrombocytopenia ?-Related to acute illness ?-We will monitor ? ?Discussed with patient's sister at bedside ? ?Unfortunately very poor reserves at baseline ?Background history of significant anxiety/agitation for which she is on multiple medications at home ?Any attempt at weaning currently associated with significant agitation which may slow the ability to liberate from the ventilator ? ? ?Best Practice (right click and "Reselect all SmartList Selections" daily)  ? ?On PPI ?DVT prophylaxis ? ?Labs   ?CBC: ?Recent Labs  ?Lab 09/24/21 ?1707 09/25/21 ?0352 09/26/21 ?0424 09/27/21 ?0420 09/28/21 ?0411 09/28/21 ?2054 09/30/21 ?0235  ?WBC 12.0* 6.8 8.7 8.8 10.5  --  13.2*  ?NEUTROABS 9.9*  --   --   --   --   --  11.9*  ?HGB 14.8 12.6 13.3 12.4 12.2 11.9* 11.7*  ?HCT 43.9 38.0 42.7 39.6 40.8 35.0* 36.3  ?MCV 96.1 97.9 102.2* 104.5* 105.7*  --  101.7*  ?PLT 162 133* 150 109* 113*  --  84*  ? ? ?Basic Metabolic Panel: ?Recent Labs  ?Lab  09/25/21 ?0352 09/26/21 ?0424 09/26/21 ?1658 09/27/21 ?0420 09/27/21 ?1751 09/28/21 ?0411 09/28/21 ?2054 09/29/21 ?2130 09/30/21 ?0235  ?NA 142 147*  --  149*  --  151* 146*  --  141  ?K 3.7 4.9  --  4.9  --  5.0 3.9  --  4.9  ?CL 108 111  --  119*  --  120*  --   --  104  ?CO2 26 29  --  25  --  27  --   --  28  ?GLUCOSE 143* 162*  --  156*  --  221*  --   --  275*  ?BUN 27* 35*  --  43*  --  44*  --   --  30*  ?CREATININE 0.83 0.93  --  0.86  --  0.84  --   --  0.61  ?CALCIUM  9.0 9.8  --  8.7*  --  9.0  --   --  8.9  ?MG  --   --   --  2.6*  --  2.7*  --  2.3  --   ?PHOS  --   --  6.0* 3.4 3.1 3.3  --   --   --   ? ?GFR: ?Estimated Creatinine Clearance: 59.1 mL/min (by C-G formula based on SCr of 0.61 mg/dL). ?Recent Labs  ?Lab 09/24/21 ?1707 09/24/21 ?1838 09/24/21 ?2220 09/25/21 ?[id number] 09/26/21 ?0424 09/27/21 ?[id number] 09/28/21 ?0411 09/30/21 ?0235  ?PROCALCITON  --   --   --   --   --  0.10  --   --   ?WBC 12.0*  --   --    < > 8.7 8.8 10.5 13.2*  ?LATICACIDVEN 1.8 2.2* 1.7  --  2.4*  --   --   --   ? < > = values in this interval not displayed.  ? ? ?Liver Function Tests: ?Recent Labs  ?Lab 09/24/21 ?1707 09/27/21 ?0420 09/28/21 ?0411 09/30/21 ?0235  ?AST 42* 1,229* 504* 126*  ?ALT 39 1,227* 1,123* 739*  ?ALKPHOS 101 64 77 94  ?BILITOT 0.7 0.7 0.5 0.6  ?PROT 8.5* 6.1* 6.7 5.7*  ?ALBUMIN 4.7 3.3* 3.5 3.1*  ? ?No results for input(s): LIPASE, AMYLASE in the last 168 hours. ?No results for input(s): AMMONIA in the last 168 hours. ? ?ABG ?   ?Component Value Date/Time  ? PHART 7.380 09/28/2021 2054  ? PCO2ART 58.6 (H) 09/28/2021 2054  ? PO2ART 112 (H) 09/28/2021 2054  ? HCO3 34.7 (H) 09/28/2021 2054  ? TCO2 37 (H) 09/28/2021 2054  ? ACIDBASEDEF 0.2 09/27/2021 0855  ? O2SAT 98 09/28/2021 2054  ?  ? ?Coagulation Profile: ?No results for input(s): INR, PROTIME in the last 168 hours. ? ?Cardiac Enzymes: ?No results for input(s): CKTOTAL, CKMB, CKMBINDEX, TROPONINI in the last 168 hours. ? ?HbA1C: ?Hgb A1c MFr Bld   ?Date/Time Value Ref Range Status  ?09/25/2021 08:08 AM 5.4 4.8 - 5.6 % Final  ?  Comment:  ?  (NOTE) ?Pre diabetes:          5.7%-6.4% ? ?Diabetes:              >6.4% ? ?Glycemic control for   <7.0% ?adults

## 2021-09-30 NOTE — Progress Notes (Signed)
? ?Progress Note ? ?Patient Name: [name] ?Date of Encounter: 09/30/2021 ? ?Primary Cardiologist:   Dorris Carnes, MD ? ? ?Subjective  ? ?Intubated and sedated.  ? ? ?Inpatient Medications  ?  ?Scheduled Meds: ? arformoterol  15 mcg Nebulization BID  ? budesonide (PULMICORT) nebulizer solution  0.5 mg Nebulization BID  ? busPIRone  15 mg Per Tube TID  ? chlorhexidine  15 mL Mouth Rinse BID  ? Chlorhexidine Gluconate Cloth  6 each Topical Q0600  ? clonazePAM  1 mg Per Tube BID  ? docusate  100 mg Per Tube BID  ? enoxaparin (LOVENOX) injection  40 mg Subcutaneous Q24H  ? feeding supplement (PROSource TF)  45 mL Per Tube Daily  ? free water  200 mL Per Tube Q6H  ? hydrALAZINE  10 mg Per Tube Q8H  ? insulin aspart  0-9 Units Subcutaneous Q4H  ? isosorbide dinitrate  20 mg Per Tube TID  ? mouth rinse  15 mL Mouth Rinse 10 times per day  ? methylPREDNISolone (SOLU-MEDROL) injection  40 mg Intravenous Q12H  ? montelukast  10 mg Per Tube q AM  ? pantoprazole sodium  40 mg Per Tube Daily  ? polyethylene glycol  17 g Per Tube Daily  ? QUEtiapine  50 mg Per Tube BID  ? revefenacin  175 mcg Nebulization Daily  ? senna  1 tablet Per Tube BID  ? ?Continuous Infusions: ? feeding supplement (OSMOLITE 1.2 CAL) 1,000 mL (09/29/21 1858)  ? fentaNYL infusion INTRAVENOUS 75 mcg/hr (09/30/21 1000)  ? midazolam 3 mg/hr (09/30/21 1000)  ? ?PRN Meds: ?acetaminophen **OR** acetaminophen, fentaNYL, guaiFENesin-dextromethorphan, levalbuterol, midazolam, polyethylene glycol  ? ?Vital Signs  ?  ?Vitals:  ? 09/30/21 0900 09/30/21 0930 09/30/21 1000 09/30/21 1030  ?BP: 129/79  (!) 155/89 128/78  ?Pulse: (!) 104 (!) 125 (!) 112 (!) 107  ?Resp: 19 (!) 26 19 17   ?Temp:      ?TempSrc:      ?SpO2: 95% 96% 98% 97%  ?Weight:      ?Height:      ? ? ?Intake/Output Summary (Last 24 hours) at 09/30/2021 1050 ?Last data filed at 09/30/2021 1000 ?Gross per 24 hour  ?Intake 3086.72 ml  ?Output 1050 ml  ?Net 2036.72 ml  ? ?Filed Weights  ? 09/27/21 0500  09/29/21 0500 09/30/21 0500  ?Weight: 53 kg 58 kg 58.4 kg  ? ? ?Telemetry  ?  ?NSR, sinus tachy, PACs - Personally Reviewed ? ?ECG  ?  ?NA - Personally Reviewed ? ?Physical Exam  ? ?GEN: No  acute distress.   ?Neck: No  JVD ?Cardiac: RRR, no murmurs, rubs, or gallops.  ?Respiratory:   Decreased breath sounds, no wheezing or crackles.  ?GI:      Decreased breath sounds ?MS:  No edema; No deformity. ?Neuro:   Nonfocal  ?Psych: Oriented and appropriate  ? ?Labs  ?  ?Chemistry ?Recent Labs  ?Lab 09/27/21 ?0420 09/28/21 ?0411 09/28/21 ?2054 09/30/21 ?0235  ?NA 149* 151* 146* 141  ?K 4.9 5.0 3.9 4.9  ?CL 119* 120*  --  104  ?CO2 25 27  --  28  ?GLUCOSE 156* 221*  --  275*  ?BUN 43* 44*  --  30*  ?CREATININE 0.86 0.84  --  0.61  ?CALCIUM 8.7* 9.0  --  8.9  ?PROT 6.1* 6.7  --  5.7*  ?ALBUMIN 3.3* 3.5  --  3.1*  ?AST 1,229* 504*  --  126*  ?ALT 1,227* 1,123*  --  739*  ?ALKPHOS [id number]  --  94  ?BILITOT 0.7 0.5  --  0.6  ?GFRNONAA >60 >60  --  >60  ?ANIONGAP 5 4*  --  9  ?  ? ?Hematology ?Recent Labs  ?Lab 09/27/21 ?0420 09/28/21 ?0411 09/28/21 ?2054 09/30/21 ?0235  ?WBC 8.8 10.5  --  13.2*  ?RBC 3.79* 3.86*  --  3.57*  ?HGB 12.4 12.2 11.9* 11.7*  ?HCT 39.6 40.8 35.0* 36.3  ?MCV 104.5* 105.7*  --  101.7*  ?MCH 32.7 31.6  --  32.8  ?MCHC 31.3 29.9*  --  32.2  ?RDW 13.4 13.6  --  13.0  ?PLT 109* 113*  --  84*  ? ? ?Cardiac EnzymesNo results for input(s): TROPONINI in the last 168 hours. No results for input(s): TROPIPOC in the last 168 hours.  ? ?BNP ?Recent Labs  ?Lab 09/27/21 ?0420  ?BNP 1,052.0*  ?  ? ?DDimer No results for input(s): DDIMER in the last 168 hours.  ? ?Radiology  ?  ?No results found. ? ?Cardiac Studies  ? ? ?Echo:  4/202/23 ? ?1. Limited visualization of the myocardium, incomplete assessment of wall  ?motion. Anteroseptal wall is akinetic. Apex is akinetic. Marland Kitchen Left  ?ventricular ejection fraction, by estimation, is 40%. The left ventricle  ?has mildly decreased function. The left  ?ventricle demonstrates  regional wall motion abnormalities (see scoring  ?diagram/findings for description). Left ventricular diastolic parameters  ?are consistent with Grade I diastolic dysfunction (impaired relaxation).  ? 2. Right ventricular systolic function is normal. The right ventricular  ?size is normal. There is normal pulmonary artery systolic pressure.  ? 3. The mitral valve is normal in structure. No evidence of mitral valve  ?regurgitation. No evidence of mitral stenosis.  ? 4. The tricuspid valve is abnormal.  ? 5. The aortic valve is tricuspid. Aortic valve regurgitation is not  ?visualized. No aortic stenosis is present.  ? 6. The inferior vena cava is normal in size with greater than 50%  ?respiratory variability, suggesting right atrial pressure of 3 mmHg.  ? ?Patient Profile  ?   ?66 y.o. 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 Carles Collet. ? ?Assessment & Plan  ?  ?HFmrEF:    Transferred to [facility] for vent management.  Unable to wean from vent.   Plan ultimately is probably left heart cath given new decreased EF and wall motion abnormalities.   Net positive 7.2 liters.  Started hydral/nitrates yesterday.   Agree with Lasix today.     ? ?Acute Hypoxic Respiratory Failure:    Vent management per CCM.    ? ?Dyslipidemia:  Increased LFTs.  Hold Crestor.  The enzymes are coming down slowly.    ?  ? ?For questions or updates, please contact [place] ?Please consult [email or web address] for contact info under Cardiology/STEMI. ?  ?Signed, ?Minus Breeding, MD  ?09/30/2021, 10:50 AM   ? ?

## 2021-09-30 NOTE — Progress Notes (Signed)
RT note. ?Patient flipped to SBT this morning. 10/5 40%. Patient failed at this time due to low mve and vt(280). Patient flipped back to full support and resting comfortable. RT will continue to monitor.  ?

## 2021-10-01 ENCOUNTER — Inpatient Hospital Stay (HOSPITAL_COMMUNITY): Payer: Medicare Other

## 2021-10-01 DIAGNOSIS — I5041 Acute combined systolic (congestive) and diastolic (congestive) heart failure: Secondary | ICD-10-CM | POA: Diagnosis not present

## 2021-10-01 DIAGNOSIS — J9602 Acute respiratory failure with hypercapnia: Secondary | ICD-10-CM | POA: Diagnosis not present

## 2021-10-01 DIAGNOSIS — J441 Chronic obstructive pulmonary disease with (acute) exacerbation: Secondary | ICD-10-CM | POA: Diagnosis not present

## 2021-10-01 DIAGNOSIS — J9601 Acute respiratory failure with hypoxia: Secondary | ICD-10-CM | POA: Diagnosis not present

## 2021-10-01 LAB — POCT I-STAT 7, (LYTES, BLD GAS, ICA,H+H)
Acid-Base Excess: 19 mmol/L — ABNORMAL HIGH (ref 0.0–2.0)
Bicarbonate: 46.3 mmol/L — ABNORMAL HIGH (ref 20.0–28.0)
Calcium, Ion: 1.18 mmol/L (ref 1.15–1.40)
HCT: 35 % — ABNORMAL LOW (ref 36.0–46.0)
Hemoglobin: 11.9 g/dL — ABNORMAL LOW (ref 12.0–15.0)
O2 Saturation: 92 %
Patient temperature: 98.1
Potassium: 4.6 mmol/L (ref 3.5–5.1)
Sodium: 138 mmol/L (ref 135–145)
TCO2: 48 mmol/L — ABNORMAL HIGH (ref 22–32)
pCO2 arterial: 65.8 mmHg (ref 32–48)
pH, Arterial: 7.455 — ABNORMAL HIGH (ref 7.35–7.45)
pO2, Arterial: 64 mmHg — ABNORMAL LOW (ref 83–108)

## 2021-10-01 LAB — GLUCOSE, CAPILLARY
Glucose-Capillary: 174 mg/dL — ABNORMAL HIGH (ref 70–99)
Glucose-Capillary: 196 mg/dL — ABNORMAL HIGH (ref 70–99)
Glucose-Capillary: 199 mg/dL — ABNORMAL HIGH (ref 70–99)
Glucose-Capillary: 256 mg/dL — ABNORMAL HIGH (ref 70–99)
Glucose-Capillary: 216 mg/dL — ABNORMAL HIGH (ref 70–99)
Glucose-Capillary: 158 mg/dL — ABNORMAL HIGH (ref 70–99)

## 2021-10-01 LAB — CBC
HCT: 35.8 % — ABNORMAL LOW (ref 36.0–46.0)
Hemoglobin: 11.6 g/dL — ABNORMAL LOW (ref 12.0–15.0)
MCH: 32.7 pg (ref 26.0–34.0)
MCHC: 32.4 g/dL (ref 30.0–36.0)
MCV: 100.8 fL — ABNORMAL HIGH (ref 80.0–100.0)
Platelets: 89 10*3/uL — ABNORMAL LOW (ref 150–400)
RBC: 3.55 MIL/uL — ABNORMAL LOW (ref 3.87–5.11)
RDW: 13.2 % (ref 11.5–15.5)
WBC: 16.8 10*3/uL — ABNORMAL HIGH (ref 4.0–10.5)
nRBC: 0 % (ref 0.0–0.2)

## 2021-10-01 LAB — BASIC METABOLIC PANEL
Anion gap: 5 (ref 5–15)
BUN: 30 mg/dL — ABNORMAL HIGH (ref 8–23)
CO2: 37 mmol/L — ABNORMAL HIGH (ref 22–32)
Calcium: 8.8 mg/dL — ABNORMAL LOW (ref 8.9–10.3)
Chloride: 99 mmol/L (ref 98–111)
Creatinine, Ser: 0.56 mg/dL (ref 0.44–1.00)
GFR, Estimated: >60 mL/min (ref >60)
Glucose, Bld: 194 mg/dL — ABNORMAL HIGH (ref 70–99)
Potassium: 5 mmol/L (ref 3.5–5.1)
Sodium: 141 mmol/L (ref 135–145)

## 2021-10-01 LAB — MAGNESIUM: Magnesium: 2.4 mg/dL (ref 1.7–2.4)

## 2021-10-01 MED ORDER — DEXMEDETOMIDINE HCL IN NACL 400 MCG/100ML IV SOLN
0.4000 ug/kg/h | INTRAVENOUS | Status: DC
  Administered 2021-10-01: 0.4 ug/kg/h via INTRAVENOUS
  Administered 2021-10-01 (×2): 1.2 ug/kg/h via INTRAVENOUS
  Administered 2021-10-02 (×3): 1.3 ug/kg/h via INTRAVENOUS
  Administered 2021-10-02: 1.4 ug/kg/h via INTRAVENOUS
  Administered 2021-10-03: 1.5 ug/kg/h via INTRAVENOUS
  Administered 2021-10-03: 0.9 ug/kg/h via INTRAVENOUS
  Administered 2021-10-03: 1.5 ug/kg/h via INTRAVENOUS
  Administered 2021-10-03: 1.3 ug/kg/h via INTRAVENOUS
  Administered 2021-10-03: 1.5 ug/kg/h via INTRAVENOUS
  Administered 2021-10-04: 1 ug/kg/h via INTRAVENOUS
  Administered 2021-10-04: 1.201 ug/kg/h via INTRAVENOUS
  Administered 2021-10-04: 1 ug/kg/h via INTRAVENOUS
  Administered 2021-10-05: 0.8 ug/kg/h via INTRAVENOUS
  Administered 2021-10-05: 1 ug/kg/h via INTRAVENOUS
  Administered 2021-10-06 (×2): 0.8 ug/kg/h via INTRAVENOUS
  Administered 2021-10-07: 0.5 ug/kg/h via INTRAVENOUS
  Administered 2021-10-07: 0.8 ug/kg/h via INTRAVENOUS
  Administered 2021-10-08: 1 ug/kg/h via INTRAVENOUS
  Administered 2021-10-08 (×2): 1.1 ug/kg/h via INTRAVENOUS
  Administered 2021-10-08: 1 ug/kg/h via INTRAVENOUS
  Administered 2021-10-09: 0.8 ug/kg/h via INTRAVENOUS
  Administered 2021-10-09: 1.1 ug/kg/h via INTRAVENOUS
  Administered 2021-10-10: 0.8 ug/kg/h via INTRAVENOUS
  Administered 2021-10-10: 0.4 ug/kg/h via INTRAVENOUS
  Administered 2021-10-11: 0.9 ug/kg/h via INTRAVENOUS
  Filled 2021-10-01 (×23): qty 100
  Filled 2021-10-01: qty 200
  Filled 2021-10-01 (×4): qty 100

## 2021-10-01 MED ORDER — SENNA 8.6 MG PO TABS
1.0000 | ORAL_TABLET | Freq: Every day | ORAL | Status: DC | PRN
  Administered 2021-10-04: 8.6 mg
  Filled 2021-10-01: qty 1

## 2021-10-01 MED ORDER — DOCUSATE SODIUM 50 MG/5ML PO LIQD
100.0000 mg | Freq: Two times a day (BID) | ORAL | Status: DC | PRN
Start: 2021-10-01 — End: 2021-11-06
  Administered 2021-10-04 – 2021-10-21 (×3): 100 mg
  Filled 2021-10-01 (×4): qty 10

## 2021-10-01 MED ORDER — FUROSEMIDE 10 MG/ML IJ SOLN
40.0000 mg | Freq: Once | INTRAMUSCULAR | Status: AC
  Administered 2021-10-01: 40 mg via INTRAVENOUS
  Filled 2021-10-01: qty 4

## 2021-10-01 MED ORDER — POLYETHYLENE GLYCOL 3350 17 G PO PACK
17.0000 g | PACK | Freq: Every day | ORAL | Status: DC | PRN
  Administered 2021-10-03 – 2021-10-04 (×2): 17 g

## 2021-10-01 MED ORDER — FREE WATER
100.0000 mL | Freq: Four times a day (QID) | Status: DC
  Administered 2021-10-01 – 2021-10-02 (×4): 100 mL

## 2021-10-01 MED ORDER — INSULIN ASPART 100 UNIT/ML IJ SOLN
0.0000 [IU] | INTRAMUSCULAR | Status: DC
  Administered 2021-10-01: 3 [IU] via SUBCUTANEOUS
  Administered 2021-10-01: 8 [IU] via SUBCUTANEOUS
  Administered 2021-10-01: 5 [IU] via SUBCUTANEOUS
  Administered 2021-10-02 (×2): 3 [IU] via SUBCUTANEOUS

## 2021-10-01 NOTE — Progress Notes (Signed)
RT NOTE: RT transported patient on ventilator from room 2M09 to CT and back to room 2M09 with no complications.  

## 2021-10-01 NOTE — Progress Notes (Signed)
? ?Progress Note ? ?Patient Name: [name] ?Date of Encounter: 10/01/2021 ? ?[facility] Cardiologist: Dorris Carnes, MD  ? ?Subjective  ? ?Remains intubated, not responsive. Per nursing team, failed weaning trial this AM. Also concern for new unresponsiveness to pain, undergoing workup for this currently. ? ?Inpatient Medications  ?  ?Scheduled Meds: ? arformoterol  15 mcg Nebulization BID  ? budesonide (PULMICORT) nebulizer solution  0.5 mg Nebulization BID  ? busPIRone  15 mg Per Tube TID  ? chlorhexidine  15 mL Mouth Rinse BID  ? Chlorhexidine Gluconate Cloth  6 each Topical Q0600  ? clonazePAM  1 mg Per Tube BID  ? docusate  100 mg Per Tube BID  ? enoxaparin (LOVENOX) injection  40 mg Subcutaneous Q24H  ? feeding supplement (PROSource TF)  45 mL Per Tube Daily  ? free water  100 mL Per Tube Q6H  ? hydrALAZINE  10 mg Per Tube Q8H  ? insulin aspart  0-9 Units Subcutaneous Q4H  ? isosorbide dinitrate  20 mg Per Tube TID  ? mouth rinse  15 mL Mouth Rinse 10 times per day  ? methylPREDNISolone (SOLU-MEDROL) injection  40 mg Intravenous Q12H  ? montelukast  10 mg Per Tube q AM  ? pantoprazole sodium  40 mg Per Tube Daily  ? polyethylene glycol  17 g Per Tube Daily  ? QUEtiapine  50 mg Per Tube BID  ? revefenacin  175 mcg Nebulization Daily  ? senna  1 tablet Per Tube BID  ? ?Continuous Infusions: ? dexmedetomidine (PRECEDEX) IV infusion 0.4 mcg/kg/hr (10/01/21 0914)  ? feeding supplement (OSMOLITE 1.2 CAL) 1,000 mL (09/30/21 1534)  ? fentaNYL infusion INTRAVENOUS 100 mcg/hr (10/01/21 0900)  ? ?PRN Meds: ?acetaminophen **OR** acetaminophen, fentaNYL, guaiFENesin-dextromethorphan, levalbuterol, polyethylene glycol  ? ?Vital Signs  ?  ?Vitals:  ? 10/01/21 0747 10/01/21 0800 10/01/21 0830 10/01/21 0900  ?BP:  (!) 200/88 (!) 158/101 (!) 176/89  ?Pulse:  (!) 120 (!) 114 (!) 118  ?Resp:  (!) 23 (!) 21 (!) 23  ?Temp:  98.1 ?F (36.7 ?C)    ?TempSrc:  Axillary    ?SpO2: 92% 90% 90% 93%  ?Weight:      ?Height:       ? ? ?Intake/Output Summary (Last 24 hours) at 10/01/2021 0935 ?Last data filed at 10/01/2021 0900 ?Gross per 24 hour  ?Intake 2448.66 ml  ?Output 750 ml  ?Net 1698.66 ml  ? ? ?  10/01/2021  ?  3:25 AM 09/30/2021  ?  5:00 AM 09/29/2021  ?  5:00 AM  ?Last 3 Weights  ?Weight (lbs) 121 lb 14.6 oz 128 lb 12 oz 127 lb 13.9 oz  ?Weight (kg) 55.3 kg 58.4 kg 58 kg  ?   ? ?Telemetry  ?  ?Sinus rhythm/sinus tachycardia, 90s-120s - Personally Reviewed ? ?ECG  ?  ?No new since 4/17 - Personally Reviewed ? ?Physical Exam  ? ?GEN: intubated, not responsive ?Neck: No JVD appreciated ?Cardiac: tachycardic but RRR, no murmurs, rubs, or gallops.  ?Respiratory: Upper airway rhonchi, ventilated breath sounds ?GI: Soft, nontender, non-distended  ?MS: No edema; No deformity. ?Neuro:  not responsive to stimuli ?Psych: not responsive to stimuli ? ?Labs  ?  ?High Sensitivity Troponin:   ?Recent Labs  ?Lab 09/24/21 ?1707 09/24/21 ?1931  ?TROPONINIHS 7 12  ?   ?Chemistry ?Recent Labs  ?Lab 09/28/21 ?0411 09/28/21 ?2054 09/29/21 ?0130 09/29/21 ?2130 09/30/21 ?[id number] 10/01/21 ?0109 10/01/21 ?0912  ?NA 151*   < > 145  --  141 141 138  ?K 5.0   < > 4.2  --  4.9 5.0 4.6  ?CL 120*  --  109  --  104 99  --   ?CO2 27  --  31  --  28 37*  --   ?GLUCOSE 221*  --  160*  --  275* 194*  --   ?BUN 44*  --  28*  --  30* 30*  --   ?CREATININE 0.84  --  0.67  --  0.61 0.56  --   ?CALCIUM 9.0  --  8.8*  --  8.9 8.8*  --   ?MG 2.7*  --   --  2.3  --  2.4  --   ?PROT 6.7  --  5.6*  --  5.7*  --   --   ?ALBUMIN 3.5  --  3.0*  --  3.1*  --   --   ?AST 504*  --  262*  --  126*  --   --   ?ALT 1,123*  --  909*  --  739*  --   --   ?ALKPHOS 77  --  75  --  94  --   --   ?BILITOT 0.5  --  0.9  --  0.6  --   --   ?GFRNONAA >60  --  >60  --  >60 >60  --   ?ANIONGAP 4*  --  5  --  9 5  --   ? < > = values in this interval not displayed.  ?  ?Lipids No results for input(s): CHOL, TRIG, HDL, LABVLDL, LDLCALC, CHOLHDL in the last 168 hours.  ?Hematology ?Recent Labs  ?Lab  09/28/21 ?0411 09/28/21 ?2054 09/30/21 ?[id number] 10/01/21 ?0109 10/01/21 ?0912  ?WBC 10.5  --  13.2* 16.8*  --   ?RBC 3.86*  --  3.57* 3.55*  --   ?HGB 12.2   < > 11.7* 11.6* 11.9*  ?HCT 40.8   < > 36.3 35.8* 35.0*  ?MCV 105.7*  --  101.7* 100.8*  --   ?MCH 31.6  --  32.8 32.7  --   ?MCHC 29.9*  --  32.2 32.4  --   ?RDW 13.6  --  13.0 13.2  --   ?PLT 113*  --  84* 89*  --   ? < > = values in this interval not displayed.  ? ?Thyroid No results for input(s): TSH, FREET4 in the last 168 hours.  ?BNP ?Recent Labs  ?Lab 09/27/21 ?0420  ?BNP 1,052.0*  ?  ?DDimer No results for input(s): DDIMER in the last 168 hours.  ? ?Radiology  ?  ?DG CHEST PORT 1 VIEW ? ?Result Date: 10/01/2021 ?CLINICAL DATA:  Shortness of breath EXAM: PORTABLE CHEST 1 VIEW COMPARISON:  09/28/2021 FINDINGS: Endotracheal tube with the tip 2.4 cm above the carina. Lungs are hyperinflated as can be seen with COPD. Bilateral chronic interstitial thickening at the lung bases. No focal consolidation, pleural effusion or pneumothorax. Nasogastric tube with the tip projecting over the stomach, but the proximal port is above the insert apical gastric junction; recommend advancing the nasogastric tube 10 cm. No acute osseous abnormality. IMPRESSION: 1. Endotracheal tube with the tip 2.4 cm above the carina. 2. Nasogastric tube with the tip projecting over the stomach, but the proximal port is above the insert apical gastric junction; recommend advancing the nasogastric tube 10 cm. 3. COPD. Electronically Signed   By: Kathreen Devoid M.D.   On: 10/01/2021  09:03   ? ?Cardiac Studies  ? ?Echo:  4/202/23 ?  ?1. Limited visualization of the myocardium, incomplete assessment of wall  ?motion. Anteroseptal wall is akinetic. Apex is akinetic. Marland Kitchen Left  ?ventricular ejection fraction, by estimation, is 40%. The left ventricle  ?has mildly decreased function. The left  ?ventricle demonstrates regional wall motion abnormalities (see scoring  ?diagram/findings for description).  Left ventricular diastolic parameters  ?are consistent with Grade I diastolic dysfunction (impaired relaxation).  ? 2. Right ventricular systolic function is normal. The right ventricular  ?size is normal. There is normal pulmonary artery systolic pressure.  ? 3. The mitral valve is normal in structure. No evidence of mitral valve  ?regurgitation. No evidence of mitral stenosis.  ? 4. The tricuspid valve is abnormal.  ? 5. The aortic valve is tricuspid. Aortic valve regurgitation is not  ?visualized. No aortic stenosis is present.  ? 6. The inferior vena cava is normal in size with greater than 50%  ?respiratory variability, suggesting right atrial pressure of 3 mmHg.  ? ?Patient Profile  ?   ?66 y.o. 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 Carles Collet. ? ?Assessment & Plan  ?  ?Cardiomyopathy ?Acute systolic and diastolic heart failre ?-EF 40% with focal wall motion abnormalities. Concern for ischemic etiology ?-elevated BNP ?-on isordil 20 mg TID, hydralazine 10 mg q8 hours. This can be increased as needed. Undergoing changes to sedation (starting precedex), continue to monitor BP ?-potassium consistently around 5, hold ACEi/ARB/ARNI/MRA for now ?-will need ischemic workup once extubated/more stable to evaluate for CAD. Not currently stable for this today. ? ?Acute hypoxic respiratory failure ?COPD exacerbation ?-management per PCCM ?-concern for nonresponsiveness this AM, failed weaning trial, management per primary ? ?Hyperlipidemia ?Transaminitis ?-holding statin until LFTs normalize, was on rosuvastatin 5 mg as outpatient ? ?For questions or updates, please contact [place] ?Please consult [email or web address] for contact info under  ?   ?Signed, ?Buford Dresser, MD  ?10/01/2021, 9:35 AM    ?

## 2021-10-01 NOTE — Progress Notes (Signed)
? ?NAME:  [name], MRN:  [id number], DOB:  1955-11-02, LOS: 6 ?ADMISSION DATE:  09/24/2021, CONSULTATION DATE:  4/18 ?REFERRING MD:  Tat/ triad, CHIEF COMPLAINT:  resp distress   ? ?History of Present Illness:  ?66 y.o. female quit smoking 2021 with  GOLD 3 COPD MZ, uncontrolled anxiety. Was on 02 one year PTA but "took herself off" per friend at bedside and able to care for pets at home but mostly housebound sinc ethen ? ?Patient presented to the ED with complaints of difficulty breathing, cough, wheezing x 1 week duration.  She also reported wheezing, and fatigue with O2 sats down to 78% on room air. ?She reported she was diagnosed with pneumonia one day PTA and given a shot of antibiotic continue on antibiotics and steroids. ?  ?EMS reported patient had rhonchi, O2 sats in the 80s on arrival to the ED.     ?  ?ED Course: Patient 98.8.  Heart rate 92-139.  Tachypneic with respiratory rate 33-37.  Blood pressure systolic Q000111Q to Q000111Q.  O2 sats 96 to 100% on 4 L.  O2 sats on room air not documented in ED.  Diffuse expiratory wheezing heard in ED. ?Patient was evaluated by RT in the ED and was placed on BiPAP. ?2 view chest x-ray without acute abnormality. ?IV Solu-Medrol, DuoNeb started.  Ativan 1 mg oral tablet given. ? ? ?Scheduled Meds: ? arformoterol  15 mcg Nebulization BID  ? budesonide (PULMICORT) nebulizer solution  0.5 mg Nebulization BID  ? busPIRone  15 mg Per Tube TID  ? chlorhexidine  15 mL Mouth Rinse BID  ? Chlorhexidine Gluconate Cloth  6 each Topical Q0600  ? clonazePAM  1 mg Per Tube BID  ? docusate  100 mg Per Tube BID  ? enoxaparin (LOVENOX) injection  40 mg Subcutaneous Q24H  ? feeding supplement (PROSource TF)  45 mL Per Tube Daily  ? free water  100 mL Per Tube Q6H  ? hydrALAZINE  10 mg Per Tube Q8H  ? insulin aspart  0-9 Units Subcutaneous Q4H  ? isosorbide dinitrate  20 mg Per Tube TID  ? mouth rinse  15 mL Mouth Rinse 10 times per day  ? methylPREDNISolone (SOLU-MEDROL) injection  40  mg Intravenous Q12H  ? montelukast  10 mg Per Tube q AM  ? pantoprazole sodium  40 mg Per Tube Daily  ? polyethylene glycol  17 g Per Tube Daily  ? QUEtiapine  50 mg Per Tube BID  ? revefenacin  175 mcg Nebulization Daily  ? senna  1 tablet Per Tube BID  ? ?Continuous Infusions: ? dexmedetomidine (PRECEDEX) IV infusion    ? feeding supplement (OSMOLITE 1.2 CAL) 1,000 mL (09/30/21 1534)  ? fentaNYL infusion INTRAVENOUS 100 mcg/hr (10/01/21 0800)  ? ?PRN Meds:.acetaminophen **OR** acetaminophen, fentaNYL, guaiFENesin-dextromethorphan, levalbuterol, polyethylene glycol  ? ? ?Significant Hospital Events: ?Including procedures, antibiotic start and stop dates in addition to other pertinent events   ?ET  4/18 c/b hypotension with severe air trapping  ?Echo 4/20 with EF 40%  ? ?Interim History / Subjective:  ?No overnight events sedated ?Not tolerating weaning ?Gets very agitated with lower sedation doses ? ?Objective   ?Blood pressure (!) 166/80, pulse 99, temperature 98.1 ?F (36.7 ?C), resp. rate 20, height 5\' 1"  (1.549 m), weight 55.3 kg, SpO2 92 %. ?   ?Vent Mode: PRVC ?FiO2 (%):  [40 %] 40 % ?Set Rate:  [15 bmp] 15 bmp ?Vt Set:  [500 mL] 500 mL ?PEEP:  [  5 cmH20] 5 cmH20 ?Plateau Pressure:  [16 cmH20-29 cmH20] 29 cmH20  ? ?Intake/Output Summary (Last 24 hours) at 10/01/2021 0830 ?Last data filed at 10/01/2021 0800 ?Gross per 24 hour  ?Intake 2276.74 ml  ?Output 750 ml  ?Net 1526.74 ml  ? ?Filed Weights  ? 09/29/21 0500 09/30/21 0500 10/01/21 0325  ?Weight: 58 kg 58.4 kg 55.3 kg  ? ? ?Examination: ?General-middle-aged, chronically ill-appearing  ?HEENT-dry oral mucosa with endotracheal tube in place ?neck -trachea is midline  ?respiratory -decreased air movement bilaterally, no wheezing ?CV -S1-S2 appreciated with no murmur ?GI -bowel sounds appreciated ?Neuro -sedated ? ?Chest x-ray 4/21-no acute infiltrate ?ABG on 4/21 noted ?   ?Assessment & Plan:  ? ?Acute hypoxemic/hypercarbic respiratory failure ?Gold stage III  COPD ?-Target TVol 6-8cc/kgIBW ?-Target Plateau Pressure < 30cm H20 ?-Ventilator associated pneumonia prevention protocol ?-Not tolerating lower doses of sedation ?-Switch from Versed to Precedex ?-Repeat chest x-ray, obtain ABG ? ?Chronic obstructive pulmonary disease ?-Brovana, Yupelri, Pulmicort ?-Continue Solu-Medrol ? ?Significant history of anxiety ?-Seroquel, Klonopin added, discontinued Xanax ?-Continue BuSpar-dose increased ? ?Transaminitis ?-Improving ? ?Hypernatremia ?-On free water, decrease free water ? ?Agitation in a ventilated patient ?-Continue fentanyl, switch from Versed to Precedex ?-RASS goal of 0 ?-We will continue to attempt to wean as tolerated ? ?Heart failure with reduced ejection fraction ?-We will continue to optimize fluid status ?-7 L positive ?-Will repeat dose of Lasix today ? ?Leukocytosis ?-Likely related to steroids ?-We will monitor ?Check x-ray today ? ?Thrombocytopenia ?-Related to acute illness ?-Stable ? ?Poor reserve at baseline ?Significant history of anxiety/agitation-this may make weaning more difficult ? ?Best Practice (right click and "Reselect all SmartList Selections" daily)  ? ?On PPI ?DVT prophylaxis ? ?Labs   ?CBC: ?Recent Labs  ?Lab 09/24/21 ?1707 09/25/21 ?0352 09/26/21 ?0424 09/27/21 ?0420 09/28/21 ?0411 09/28/21 ?2054 09/30/21 ?[id number] 10/01/21 ?0109  ?WBC 12.0*   < > 8.7 8.8 10.5  --  13.2* 16.8*  ?NEUTROABS 9.9*  --   --   --   --   --  11.9*  --   ?HGB 14.8   < > 13.3 12.4 12.2 11.9* 11.7* 11.6*  ?HCT 43.9   < > 42.7 39.6 40.8 35.0* 36.3 35.8*  ?MCV 96.1   < > 102.2* 104.5* 105.7*  --  101.7* 100.8*  ?PLT 162   < > 150 109* 113*  --  84* 89*  ? < > = values in this interval not displayed.  ? ? ?Basic Metabolic Panel: ?Recent Labs  ?Lab 09/26/21 ?1658 09/27/21 ?0420 09/27/21 ?1751 09/28/21 ?0411 09/28/21 ?2054 09/29/21 ?0130 09/29/21 ?2130 09/30/21 ?[id number] 10/01/21 ?0109  ?NA  --  149*  --  151* 146* 145  --  141 141  ?K  --  4.9  --  5.0 3.9 4.2  --  4.9 5.0  ?CL   --  119*  --  120*  --  109  --  104 99  ?CO2  --  25  --  27  --  31  --  28 37*  ?GLUCOSE  --  156*  --  221*  --  160*  --  275* 194*  ?BUN  --  43*  --  44*  --  28*  --  30* 30*  ?CREATININE  --  0.86  --  0.84  --  0.67  --  0.61 0.56  ?CALCIUM  --  8.7*  --  9.0  --  8.8*  --  8.9 8.8*  ?  MG  --  2.6*  --  2.7*  --   --  2.3  --  2.4  ?PHOS 6.0* 3.4 3.1 3.3  --   --   --   --   --   ? ?GFR: ?Estimated Creatinine Clearance: 54.3 mL/min (by C-G formula based on SCr of 0.56 mg/dL). ?Recent Labs  ?Lab 09/24/21 ?1707 09/24/21 ?1838 09/24/21 ?2220 09/25/21 ?[id number] 09/26/21 ?0424 09/27/21 ?[id number] 09/28/21 ?0411 09/30/21 ?[id number] 10/01/21 ?0109  ?PROCALCITON  --   --   --   --   --  0.10  --   --   --   ?WBC 12.0*  --   --    < > 8.7 8.8 10.5 13.2* 16.8*  ?LATICACIDVEN 1.8 2.2* 1.7  --  2.4*  --   --   --   --   ? < > = values in this interval not displayed.  ? ? ?Liver Function Tests: ?Recent Labs  ?Lab 09/24/21 ?1707 09/27/21 ?0420 09/28/21 ?0411 09/29/21 ?0130 09/30/21 ?0235  ?AST 42* 1,229* 504* 262* 126*  ?ALT 39 1,227* 1,123* 909* 739*  ?ALKPHOS 101 64 77 75 94  ?BILITOT 0.7 0.7 0.5 0.9 0.6  ?PROT 8.5* 6.1* 6.7 5.6* 5.7*  ?ALBUMIN 4.7 3.3* 3.5 3.0* 3.1*  ? ?No results for input(s): LIPASE, AMYLASE in the last 168 hours. ?No results for input(s): AMMONIA in the last 168 hours. ? ?ABG ?   ?Component Value Date/Time  ? PHART 7.380 09/28/2021 2054  ? PCO2ART 58.6 (H) 09/28/2021 2054  ? PO2ART 112 (H) 09/28/2021 2054  ? HCO3 34.7 (H) 09/28/2021 2054  ? TCO2 37 (H) 09/28/2021 2054  ? ACIDBASEDEF 0.2 09/27/2021 0855  ? O2SAT 98 09/28/2021 2054  ?  ? ?Coagulation Profile: ?No results for input(s): INR, PROTIME in the last 168 hours. ? ?Cardiac Enzymes: ?No results for input(s): CKTOTAL, CKMB, CKMBINDEX, TROPONINI in the last 168 hours. ? ?HbA1C: ?Hgb A1c MFr Bld  ?Date/Time Value Ref Range Status  ?09/25/2021 08:08 AM 5.4 4.8 - 5.6 % Final  ?  Comment:  ?  (NOTE) ?Pre diabetes:          5.7%-6.4% ? ?Diabetes:               >6.4% ? ?Glycemic control for   <7.0% ?adults with diabetes ?  ? ? ?CBG: ?Recent Labs  ?Lab 09/30/21 ?1132 09/30/21 ?1534 09/30/21 ?1925 09/30/21 ?2319 10/01/21 ?0323  ?GLUCAP 208* 146* 177* 174* 196*  ? ?The

## 2021-10-01 NOTE — Plan of Care (Signed)
?  Problem: Education: ?Goal: Knowledge of disease or condition will improve ?Outcome: Not Progressing ?Goal: Knowledge of the prescribed therapeutic regimen will improve ?Outcome: Not Progressing ?Goal: Individualized Educational Video(s) ?Outcome: Not Progressing ?  ?Problem: Activity: ?Goal: Ability to tolerate increased activity will improve ?Outcome: Not Progressing ?Goal: Will verbalize the importance of balancing activity with adequate rest periods ?Outcome: Not Progressing ?  ?Problem: Respiratory: ?Goal: Ability to maintain a clear airway will improve ?Outcome: Not Progressing ?Goal: Levels of oxygenation will improve ?Outcome: Not Progressing ?Goal: Ability to maintain adequate ventilation will improve ?Outcome: Not Progressing ?  ?Problem: Education: ?Goal: Knowledge of General Education information will improve ?Description: Including pain rating scale, medication(s)/side effects and non-pharmacologic comfort measures ?Outcome: Not Progressing ?  ?Problem: Health Behavior/Discharge Planning: ?Goal: Ability to manage health-related needs will improve ?Outcome: Not Progressing ?  ?Problem: Clinical Measurements: ?Goal: Ability to maintain clinical measurements within normal limits will improve ?Outcome: Not Progressing ?Goal: Will remain free from infection ?Outcome: Not Progressing ?Goal: Diagnostic test results will improve ?Outcome: Not Progressing ?Goal: Respiratory complications will improve ?Outcome: Not Progressing ?Goal: Cardiovascular complication will be avoided ?Outcome: Not Progressing ?  ?Problem: Activity: ?Goal: Risk for activity intolerance will decrease ?Outcome: Not Progressing ?  ?Problem: Nutrition: ?Goal: Adequate nutrition will be maintained ?Outcome: Not Progressing ?  ?Problem: Coping: ?Goal: Level of anxiety will decrease ?Outcome: Not Progressing ?  ?Problem: Elimination: ?Goal: Will not experience complications related to bowel motility ?Outcome: Not Progressing ?Goal: Will not  experience complications related to urinary retention ?Outcome: Not Progressing ?  ?Problem: Pain Managment: ?Goal: General experience of comfort will improve ?Outcome: Not Progressing ?  ?Problem: Safety: ?Goal: Ability to remain free from injury will improve ?Outcome: Not Progressing ?  ?Problem: Skin Integrity: ?Goal: Risk for impaired skin integrity will decrease ?Outcome: Not Progressing ?  ?Problem: Safety: ?Goal: Non-violent Restraint(s) ?Outcome: Not Progressing ?  ?

## 2021-10-01 NOTE — Progress Notes (Signed)
Discharge Progress Report ? ?Patient Details  ?Name: [name] ?MRN: [id number] ?Date of Birth: 08-Apr-1956 ?Referring Provider:   ?Flowsheet Row PULMONARY REHAB COPD ORIENTATION from 09/13/2021 in [facility]  ?Referring Provider Dr. Lamonte Sakai  ? ?  ? ? ? ?Number of Visits: 2 ? ?Reason for Discharge:  ?Early Exit:  Hospitalized  ? ?Smoking History:  ?Social History  ? ?Tobacco Use  ?Smoking Status Former  ? Packs/day: 2.00  ? Years: 45.00  ? Pack years: 90.00  ? Types: Cigarettes  ? Quit date: 04/11/2019  ? Years since quitting: 2.4  ?Smokeless Tobacco Never  ? ? ?Diagnosis:  ?Chronic obstructive pulmonary disease, unspecified COPD type ([place]) ? ?ADL [facility]: ? Pulmonary Assessment Scores   ? ? [place] Name 09/13/21 1356  ?  ?  ?  ? ADL [facility]  ? ADL Phase Entry    ? SOB Score total 73    ? Rest 1    ? Walk 3    ? Stairs 5    ? Bath 3    ? Dress 4    ? Shop 4    ?  ? CAT Score  ? CAT Score 22    ?  ? mMRC Score  ? mMRC Score 3    ? ?  ?  ? ?  ? ? ?Initial Exercise Prescription: ? Initial Exercise Prescription - 09/13/21 1300   ? ?  ? Date of Initial Exercise RX and Referring Provider  ? Date 09/13/21   ? Referring Provider Dr. Lamonte Sakai   ? Expected Discharge Date 01/17/22   ?  ? Treadmill  ? MPH 1   ? Grade 0   ? Minutes 17   ?  ? NuStep  ? Level 1   ? SPM 80   ? Minutes 22   ?  ? Prescription Details  ? Frequency (times per week) 2   ? Duration Progress to 30 minutes of continuous aerobic without signs/symptoms of physical distress   ?  ? Intensity  ? THRR 40-80% of Max Heartrate 63-126   ? Ratings of Perceived Exertion 11-13   ? Perceived Dyspnea 0-4   ?  ? Resistance Training  ? Training Prescription Yes   ? Weight 3   ? Reps 10-15   ? ?  ?  ? ?  ? ? ?Discharge Exercise Prescription (Final Exercise Prescription Changes): ? Exercise Prescription Changes - 09/18/21 1500   ? ?  ? Response to Exercise  ? Blood Pressure (Admit) 138/60   ? Blood Pressure (Exercise) 140/60   ? Blood Pressure (Exit) 130/62   ? Heart  Rate (Admit) 94 bpm   ? Heart Rate (Exercise) 105 bpm   ? Heart Rate (Exit) 90 bpm   ? Oxygen Saturation (Admit) 95 %   ? Oxygen Saturation (Exercise) 93 %   ? Oxygen Saturation (Exit) 94 %   ? Rating of Perceived Exertion (Exercise) 13   ? Perceived Dyspnea (Exercise) 13   ? Duration Continue with 30 min of aerobic exercise without signs/symptoms of physical distress.   ? Intensity THRR unchanged   ?  ? Progression  ? Progression Continue to progress workloads to maintain intensity without signs/symptoms of physical distress.   ?  ? Resistance Training  ? Training Prescription Yes   ? Weight 3   ? Reps 10-15   ? Time 10 Minutes   ?  ? Treadmill  ? MPH 1   ? Grade 0   ?  Minutes 17   ? METs 1.7   ?  ? NuStep  ? Level 1   ? SPM 79   ? Minutes 22   ? METs 1.8   ? ?  ?  ? ?  ? ? ?Functional Capacity: ? 6 Minute Walk   ? ? [place] Name 09/13/21 1349  ?  ?  ?  ? 6 Minute Walk  ? Phase Initial    ? Distance 1050 feet    ? Walk Time 6 minutes    ? # of Rest Breaks 0    ? MPH 1.99    ? METS 2.94    ? RPE 9    ? Perceived Dyspnea  13    ? VO2 Peak 10.3    ? Symptoms No    ? Resting HR 77 bpm    ? Resting BP 104/60    ? Resting Oxygen Saturation  97 %    ? Exercise Oxygen Saturation  during 6 min walk 91 %    ? Max Ex. HR 100 bpm    ? Max Ex. BP 140/68    ? 2 Minute Post BP 110/64    ?  ? Interval HR  ? 1 Minute HR 89    ? 2 Minute HR 93    ? 3 Minute HR 96    ? 4 Minute HR 100    ? 5 Minute HR 97    ? 6 Minute HR 100    ? 2 Minute Post HR 79    ? Interval Heart Rate? Yes    ?  ? Interval Oxygen  ? Interval Oxygen? Yes    ? Baseline Oxygen Saturation % 97 %    ? 1 Minute Oxygen Saturation % 95 %    ? 1 Minute Liters of Oxygen 0 L    ? 2 Minute Oxygen Saturation % 92 %    ? 2 Minute Liters of Oxygen 0 L    ? 3 Minute Oxygen Saturation % 92 %    ? 3 Minute Liters of Oxygen 0 L    ? 4 Minute Oxygen Saturation % 91 %    ? 4 Minute Liters of Oxygen 0 L    ? 5 Minute Oxygen Saturation % 92 %    ? 5 Minute Liters of Oxygen 0 L    ? 6  Minute Oxygen Saturation % 92 %    ? 6 Minute Liters of Oxygen 0 L    ? 2 Minute Post Oxygen Saturation % 98 %    ? 2 Minute Post Liters of Oxygen 0 L    ? ?  ?  ? ?  ? ? ?Psychological, QOL, Others - Outcomes: ?PHQ 2/9: ? ?  09/13/2021  ?  1:48 PM  ?Depression screen PHQ 2/9  ?Decreased Interest 1  ?Down, Depressed, Hopeless 0  ?PHQ - 2 Score 1  ?Altered sleeping 3  ?Tired, decreased energy 2  ?Change in appetite 0  ?Feeling bad or failure about yourself  0  ?Trouble concentrating 0  ?Moving slowly or fidgety/restless 0  ?Suicidal thoughts 0  ?PHQ-9 Score 6  ?Difficult doing work/chores Somewhat difficult  ? ? ?Quality of Life: ? Quality of Life - 09/13/21 1355   ? ?  ? Quality of Life  ? [place] Quality of Life   ?  ? Quality of Life Scores  ? Health/Function Pre 7.78 %   ? Socioeconomic Pre 21.7 %   ?  Psych/Spiritual Pre 14.57 %   ? Family Pre 19.5 %   ? GLOBAL Pre 12.91 %   ? ?  ?  ? ?  ? ? ?Personal Goals: ?Goals established at orientation with interventions provided to work toward goal. ? Personal Goals and Risk Factors at Admission - 09/13/21 1353   ? ?  ? Core Components/Risk Factors/Patient Goals on Admission  ?  Weight Management Weight Maintenance   ? Improve shortness of breath with ADL's Yes   ? Intervention Provide education, individualized exercise plan and daily activity instruction to help decrease symptoms of SOB with activities of daily living.   ? Expected Outcomes Short Term: Improve cardiorespiratory fitness to achieve a reduction of symptoms when performing ADLs   ? Personal Goal Other Yes   ? Personal Goal Patients wants to be able to walk long distance; climb stairs; and be more active physically. She wants to improve her SOB and be able to do things outside again.   ? Intervention Patient will attend PR 2 days/week with exercise and education and supplement with exercise at home.   ? Expected Outcomes Patient will complete the program meeting both personal and program goals.   ? ?  ?  ? ?  ?   ? ?Personal Goals Discharge: ? Goals and Risk Factor Review   ? ? [place] Name 09/25/21 1439  ?  ?  ?  ?  ?  ? Core Components/Risk Factors/Patient Goals Review  ? Personal Goals Review Improve shortness of breath with ADL's;Develop more efficient breathing techniques such as purse lipped breathing and diaphragmatic breathing and practicing self-pacing with activity.      ? Review Patient is new to the program completing 1 session.  Patient was referred to program for COPD by Dr. Lamonte Sakai.  Her personal goals for the program is to improve SOB, walk long distance , go [place], and be able to do things outside. We will continue to monitor her progress as she works toward meeting these goals.      ? Expected Outcomes Patient will complete the program meeting both program and personal goals.      ? ?  ?  ? ?  ? ? ?Exercise Goals and Review: ? Exercise Goals   ? ? [place] Name 09/13/21 1354  ?  ?  ?  ?  ?  ? Exercise Goals  ? Increase Physical Activity Yes      ? Intervention Provide advice, education, support and counseling about physical activity/exercise needs.;Develop an individualized exercise prescription for aerobic and resistive training based on initial evaluation findings, risk stratification, comorbidities and participant's personal goals.      ? Expected Outcomes Short Term: Attend rehab on a regular basis to increase amount of physical activity.;Long Term: Add in home exercise to make exercise part of routine and to increase amount of physical activity.;Long Term: Exercising regularly at least 3-5 days a week.      ? Increase Strength and Stamina Yes      ? Intervention Provide advice, education, support and counseling about physical activity/exercise needs.;Develop an individualized exercise prescription for aerobic and resistive training based on initial evaluation findings, risk stratification, comorbidities and participant's personal goals.      ? Expected Outcomes Short Term: Increase workloads from initial  exercise prescription for resistance, speed, and METs.;Short Term: Perform resistance training exercises routinely during rehab and add in resistance training at home;Long Term: Improve cardiorespiratory fit

## 2021-10-02 ENCOUNTER — Encounter (HOSPITAL_COMMUNITY): Payer: Medicare Other

## 2021-10-02 DIAGNOSIS — J9601 Acute respiratory failure with hypoxia: Secondary | ICD-10-CM | POA: Diagnosis not present

## 2021-10-02 DIAGNOSIS — J441 Chronic obstructive pulmonary disease with (acute) exacerbation: Secondary | ICD-10-CM | POA: Diagnosis not present

## 2021-10-02 DIAGNOSIS — J9602 Acute respiratory failure with hypercapnia: Secondary | ICD-10-CM | POA: Diagnosis not present

## 2021-10-02 DIAGNOSIS — I5041 Acute combined systolic (congestive) and diastolic (congestive) heart failure: Secondary | ICD-10-CM | POA: Diagnosis not present

## 2021-10-02 LAB — BASIC METABOLIC PANEL
Anion gap: 7 (ref 5–15)
BUN: 36 mg/dL — ABNORMAL HIGH (ref 8–23)
CO2: 37 mmol/L — ABNORMAL HIGH (ref 22–32)
Calcium: 8.9 mg/dL (ref 8.9–10.3)
Chloride: 99 mmol/L (ref 98–111)
Creatinine, Ser: 0.62 mg/dL (ref 0.44–1.00)
GFR, Estimated: >60 mL/min (ref >60)
Glucose, Bld: 247 mg/dL — ABNORMAL HIGH (ref 70–99)
Potassium: 5.2 mmol/L — ABNORMAL HIGH (ref 3.5–5.1)
Sodium: 143 mmol/L (ref 135–145)

## 2021-10-02 LAB — GLUCOSE, CAPILLARY
Glucose-Capillary: 152 mg/dL — ABNORMAL HIGH (ref 70–99)
Glucose-Capillary: 178 mg/dL — ABNORMAL HIGH (ref 70–99)
Glucose-Capillary: 191 mg/dL — ABNORMAL HIGH (ref 70–99)
Glucose-Capillary: 192 mg/dL — ABNORMAL HIGH (ref 70–99)
Glucose-Capillary: 199 mg/dL — ABNORMAL HIGH (ref 70–99)
Glucose-Capillary: 201 mg/dL — ABNORMAL HIGH (ref 70–99)
Glucose-Capillary: 214 mg/dL — ABNORMAL HIGH (ref 70–99)

## 2021-10-02 MED ORDER — HYDRALAZINE HCL 50 MG PO TABS
25.0000 mg | ORAL_TABLET | Freq: Three times a day (TID) | ORAL | Status: DC
  Administered 2021-10-02 – 2021-10-07 (×12): 25 mg
  Filled 2021-10-02 (×13): qty 1

## 2021-10-02 MED ORDER — SODIUM CHLORIDE 0.9 % IV SOLN
0.5000 mg/kg/h | INTRAVENOUS | Status: DC
  Administered 2021-10-02 – 2021-10-03 (×3): 0.5 mg/kg/h via INTRAVENOUS
  Administered 2021-10-03: 1 mg/kg/h via INTRAVENOUS
  Administered 2021-10-04 – 2021-10-06 (×4): 0.5 mg/kg/h via INTRAVENOUS
  Filled 2021-10-02 (×11): qty 5

## 2021-10-02 MED ORDER — FUROSEMIDE 10 MG/ML IJ SOLN
40.0000 mg | Freq: Once | INTRAMUSCULAR | Status: AC
  Administered 2021-10-02: 40 mg via INTRAVENOUS
  Filled 2021-10-02: qty 4

## 2021-10-02 MED ORDER — INSULIN ASPART 100 UNIT/ML IJ SOLN
0.0000 [IU] | INTRAMUSCULAR | Status: DC
  Administered 2021-10-02 (×2): 7 [IU] via SUBCUTANEOUS
  Administered 2021-10-02 – 2021-10-03 (×5): 4 [IU] via SUBCUTANEOUS
  Administered 2021-10-03: 11 [IU] via SUBCUTANEOUS
  Administered 2021-10-03: 4 [IU] via SUBCUTANEOUS
  Administered 2021-10-03: 7 [IU] via SUBCUTANEOUS
  Administered 2021-10-04: 3 [IU] via SUBCUTANEOUS
  Administered 2021-10-04 (×3): 4 [IU] via SUBCUTANEOUS
  Administered 2021-10-04: 7 [IU] via SUBCUTANEOUS
  Administered 2021-10-04: 4 [IU] via SUBCUTANEOUS
  Administered 2021-10-04: 3 [IU] via SUBCUTANEOUS
  Administered 2021-10-05: 7 [IU] via SUBCUTANEOUS
  Administered 2021-10-05 (×3): 4 [IU] via SUBCUTANEOUS
  Administered 2021-10-05: 7 [IU] via SUBCUTANEOUS
  Administered 2021-10-06: 3 [IU] via SUBCUTANEOUS
  Administered 2021-10-06 (×2): 4 [IU] via SUBCUTANEOUS
  Administered 2021-10-06: 7 [IU] via SUBCUTANEOUS
  Administered 2021-10-06: 11 [IU] via SUBCUTANEOUS
  Administered 2021-10-06: 4 [IU] via SUBCUTANEOUS
  Administered 2021-10-07: 7 [IU] via SUBCUTANEOUS
  Administered 2021-10-07 (×2): 4 [IU] via SUBCUTANEOUS
  Administered 2021-10-07: 7 [IU] via SUBCUTANEOUS
  Administered 2021-10-07: 4 [IU] via SUBCUTANEOUS
  Administered 2021-10-07 – 2021-10-08 (×2): 3 [IU] via SUBCUTANEOUS
  Administered 2021-10-08: 7 [IU] via SUBCUTANEOUS
  Administered 2021-10-08 – 2021-10-09 (×3): 3 [IU] via SUBCUTANEOUS
  Administered 2021-10-09 (×3): 4 [IU] via SUBCUTANEOUS
  Administered 2021-10-09 – 2021-10-10 (×4): 3 [IU] via SUBCUTANEOUS
  Administered 2021-10-10 – 2021-10-11 (×2): 4 [IU] via SUBCUTANEOUS
  Administered 2021-10-11 – 2021-10-12 (×4): 3 [IU] via SUBCUTANEOUS
  Administered 2021-10-12 (×2): 4 [IU] via SUBCUTANEOUS
  Administered 2021-10-13 – 2021-10-15 (×6): 3 [IU] via SUBCUTANEOUS
  Administered 2021-10-16: 4 [IU] via SUBCUTANEOUS
  Administered 2021-10-17 – 2021-10-20 (×11): 3 [IU] via SUBCUTANEOUS
  Administered 2021-10-21: 4 [IU] via SUBCUTANEOUS
  Administered 2021-10-21 – 2021-10-24 (×5): 3 [IU] via SUBCUTANEOUS
  Administered 2021-10-24: 4 [IU] via SUBCUTANEOUS
  Administered 2021-10-24: 3 [IU] via SUBCUTANEOUS
  Administered 2021-10-25: 4 [IU] via SUBCUTANEOUS
  Administered 2021-10-25 – 2021-10-26 (×5): 3 [IU] via SUBCUTANEOUS
  Administered 2021-10-27: 4 [IU] via SUBCUTANEOUS
  Administered 2021-10-27 (×2): 3 [IU] via SUBCUTANEOUS
  Administered 2021-10-27: 4 [IU] via SUBCUTANEOUS
  Administered 2021-10-28: 3 [IU] via SUBCUTANEOUS
  Administered 2021-10-28: 4 [IU] via SUBCUTANEOUS
  Administered 2021-10-28 – 2021-10-30 (×8): 3 [IU] via SUBCUTANEOUS

## 2021-10-02 NOTE — Plan of Care (Signed)
Patient continues to have inc WOB despite sedation increases. Her restraints were discontinued this am as she is moving only minimally. Safety mittens are on. She is at her goal on her tube feedings. ?

## 2021-10-02 NOTE — Progress Notes (Signed)
? ?Progress Note ? ?Patient Name: [name] ?Date of Encounter: 10/02/2021 ? ?[facility] Cardiologist: Dorris Carnes, MD  ? ?Subjective  ? ?CT head yesterday without acute abnormality. Remains on precedex and fentanyl for sedation. Less agitated this AM, opens eyes, moves hands to voice. ? ?Inpatient Medications  ?  ?Scheduled Meds: ? arformoterol  15 mcg Nebulization BID  ? budesonide (PULMICORT) nebulizer solution  0.5 mg Nebulization BID  ? chlorhexidine  15 mL Mouth Rinse BID  ? Chlorhexidine Gluconate Cloth  6 each Topical Q0600  ? enoxaparin (LOVENOX) injection  40 mg Subcutaneous Q24H  ? feeding supplement (PROSource TF)  45 mL Per Tube Daily  ? furosemide  40 mg Intravenous Once  ? hydrALAZINE  25 mg Per Tube Q8H  ? insulin aspart  0-20 Units Subcutaneous Q4H  ? isosorbide dinitrate  20 mg Per Tube TID  ? mouth rinse  15 mL Mouth Rinse 10 times per day  ? methylPREDNISolone (SOLU-MEDROL) injection  40 mg Intravenous Q12H  ? montelukast  10 mg Per Tube q AM  ? pantoprazole sodium  40 mg Per Tube Daily  ? QUEtiapine  50 mg Per Tube BID  ? revefenacin  175 mcg Nebulization Daily  ? ?Continuous Infusions: ? dexmedetomidine (PRECEDEX) IV infusion 1.3 mcg/kg/hr (10/02/21 0600)  ? feeding supplement (OSMOLITE 1.2 CAL) 1,000 mL (09/30/21 1534)  ? fentaNYL infusion INTRAVENOUS 150 mcg/hr (10/02/21 0600)  ? ketamine (KETALAR) Adult IV Infusion    ? ?PRN Meds: ?acetaminophen **OR** acetaminophen, docusate, fentaNYL, guaiFENesin-dextromethorphan, levalbuterol, polyethylene glycol, polyethylene glycol, senna  ? ?Vital Signs  ?  ?Vitals:  ? 10/02/21 0700 10/02/21 0728 10/02/21 0732 10/02/21 0740  ?BP: (!) 163/81  (!) 188/86   ?Pulse: (!) 108 (!) 110 (!) 112   ?Resp: (!) 23 (!) 26 (!) 32   ?Temp:   98.2 ?F (36.8 ?C)   ?TempSrc:   Axillary   ?SpO2: 95% 95% 97% 95%  ?Weight:      ?Height:      ? ? ?Intake/Output Summary (Last 24 hours) at 10/02/2021 0847 ?Last data filed at 10/02/2021 0600 ?Gross per 24 hour  ?Intake  2230 ml  ?Output 1650 ml  ?Net 580 ml  ? ? ?  10/02/2021  ?  5:00 AM 10/01/2021  ?  3:25 AM 09/30/2021  ?  5:00 AM  ?Last 3 Weights  ?Weight (lbs) 125 lb 7.1 oz 121 lb 14.6 oz 128 lb 12 oz  ?Weight (kg) 56.9 kg 55.3 kg 58.4 kg  ?   ? ?Telemetry  ?  ?Sinus rhythm/sinus tachycardia, 90s-120s - Personally Reviewed ? ?ECG  ?  ?No new since 4/17 - Personally Reviewed ? ?Physical Exam  ? ?GEN: Intubated, sedated. ?NECK: No JVD ?CARDIAC: regular rhythm, normal S1 and S2, no rubs or gallops. No murmur. ?VASCULAR: Radial pulses 2+ bilaterally.  ?RESPIRATORY:  Intubated, breath sounds clearer anteriorly ?ABDOMEN: Soft, non-tender, non-distended ?SKIN: Warm and dry, no edema ?NEUROLOGIC:  Opens eyes, moves hands to voice ?PSYCHIATRIC:  Sedated, appears calmer than yesterday ? ?Labs  ?  ?High Sensitivity Troponin:   ?Recent Labs  ?Lab 09/24/21 ?1707 09/24/21 ?1931  ?TROPONINIHS 7 12  ?   ?Chemistry ?Recent Labs  ?Lab 09/28/21 ?0411 09/28/21 ?2054 09/29/21 ?0130 09/29/21 ?2130 09/30/21 ?[id number] 10/01/21 ?0109 10/01/21 ?0912  ?NA 151*   < > 145  --  141 141 138  ?K 5.0   < > 4.2  --  4.9 5.0 4.6  ?CL 120*  --  109  --  104 99  --   ?CO2 27  --  31  --  28 37*  --   ?GLUCOSE 221*  --  160*  --  275* 194*  --   ?BUN 44*  --  28*  --  30* 30*  --   ?CREATININE 0.84  --  0.67  --  0.61 0.56  --   ?CALCIUM 9.0  --  8.8*  --  8.9 8.8*  --   ?MG 2.7*  --   --  2.3  --  2.4  --   ?PROT 6.7  --  5.6*  --  5.7*  --   --   ?ALBUMIN 3.5  --  3.0*  --  3.1*  --   --   ?AST 504*  --  262*  --  126*  --   --   ?ALT 1,123*  --  909*  --  739*  --   --   ?ALKPHOS 77  --  75  --  94  --   --   ?BILITOT 0.5  --  0.9  --  0.6  --   --   ?GFRNONAA >60  --  >60  --  >60 >60  --   ?ANIONGAP 4*  --  5  --  9 5  --   ? < > = values in this interval not displayed.  ?  ?Lipids No results for input(s): CHOL, TRIG, HDL, LABVLDL, LDLCALC, CHOLHDL in the last 168 hours.  ?Hematology ?Recent Labs  ?Lab 09/28/21 ?0411 09/28/21 ?2054 09/30/21 ?[id number] 10/01/21 ?0109  10/01/21 ?0912  ?WBC 10.5  --  13.2* 16.8*  --   ?RBC 3.86*  --  3.57* 3.55*  --   ?HGB 12.2   < > 11.7* 11.6* 11.9*  ?HCT 40.8   < > 36.3 35.8* 35.0*  ?MCV 105.7*  --  101.7* 100.8*  --   ?MCH 31.6  --  32.8 32.7  --   ?MCHC 29.9*  --  32.2 32.4  --   ?RDW 13.6  --  13.0 13.2  --   ?PLT 113*  --  84* 89*  --   ? < > = values in this interval not displayed.  ? ?Thyroid No results for input(s): TSH, FREET4 in the last 168 hours.  ?BNP ?Recent Labs  ?Lab 09/27/21 ?0420  ?BNP 1,052.0*  ?  ?DDimer No results for input(s): DDIMER in the last 168 hours.  ? ?Radiology  ?  ?CT HEAD WO CONTRAST (5MM) ? ?Result Date: 10/01/2021 ?CLINICAL DATA:  Altered mental status, nontraumatic EXAM: CT HEAD WITHOUT CONTRAST TECHNIQUE: Contiguous axial images were obtained from the base of the skull through the vertex without intravenous contrast. RADIATION DOSE REDUCTION: This exam was performed according to the departmental dose-optimization program which includes automated exposure control, adjustment of the mA and/or kV according to patient size and/or use of iterative reconstruction technique. COMPARISON:  None. FINDINGS: Brain: There is no acute intracranial hemorrhage, mass effect, or edema. Gray-white differentiation is preserved. There is no extra-axial fluid collection. Ventricles and sulci are within normal limits in size and configuration. Vascular: There is atherosclerotic calcification at the skull base. Skull: Calvarium is unremarkable. Sinuses/Orbits: Patchy mucosal thickening.  Orbits are unremarkable. Other: None. IMPRESSION: No acute intracranial abnormality. Electronically Signed   By: Macy Mis M.D.   On: 10/01/2021 16:13  ? ?DG CHEST PORT 1 VIEW ? ?Result Date: 10/01/2021 ?CLINICAL DATA:  Shortness of  breath EXAM: PORTABLE CHEST 1 VIEW COMPARISON:  09/28/2021 FINDINGS: Endotracheal tube with the tip 2.4 cm above the carina. Lungs are hyperinflated as can be seen with COPD. Bilateral chronic interstitial thickening  at the lung bases. No focal consolidation, pleural effusion or pneumothorax. Nasogastric tube with the tip projecting over the stomach, but the proximal port is above the insert apical gastric junction; recommend advancing the nasogastric tube 10 cm. No acute osseous abnormality. IMPRESSION: 1. Endotracheal tube with the tip 2.4 cm above the carina. 2. Nasogastric tube with the tip projecting over the stomach, but the proximal port is above the insert apical gastric junction; recommend advancing the nasogastric tube 10 cm. 3. COPD. Electronically Signed   By: Kathreen Devoid M.D.   On: 10/01/2021 09:03   ? ?Cardiac Studies  ? ?Echo:  4/202/23 ?  ?1. Limited visualization of the myocardium, incomplete assessment of wall  ?motion. Anteroseptal wall is akinetic. Apex is akinetic. Marland Kitchen Left  ?ventricular ejection fraction, by estimation, is 40%. The left ventricle  ?has mildly decreased function. The left  ?ventricle demonstrates regional wall motion abnormalities (see scoring  ?diagram/findings for description). Left ventricular diastolic parameters  ?are consistent with Grade I diastolic dysfunction (impaired relaxation).  ? 2. Right ventricular systolic function is normal. The right ventricular  ?size is normal. There is normal pulmonary artery systolic pressure.  ? 3. The mitral valve is normal in structure. No evidence of mitral valve  ?regurgitation. No evidence of mitral stenosis.  ? 4. The tricuspid valve is abnormal.  ? 5. The aortic valve is tricuspid. Aortic valve regurgitation is not  ?visualized. No aortic stenosis is present.  ? 6. The inferior vena cava is normal in size with greater than 50%  ?respiratory variability, suggesting right atrial pressure of 3 mmHg.  ? ?Patient Profile  ?   ?66 y.o. 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 Carles Collet. ? ?Assessment & Plan  ?  ?Cardiomyopathy ?Acute systolic and  diastolic heart failure ?Hypertension ?-EF 40% with focal wall motion abnormalities. Concern for ischemic etiology ?-elevated BNP ?-on isordil 20 mg TID, hydralazine 10 mg q8 hours. Bps now more consistently eleva

## 2021-10-02 NOTE — Progress Notes (Signed)
? ?NAME:  [name], MRN:  [id number], DOB:  11-04-1955, LOS: 7 ?ADMISSION DATE:  09/24/2021, CONSULTATION DATE:  4/18 ?REFERRING MD:  Tat/ triad, CHIEF COMPLAINT:  resp distress   ? ?History of Present Illness:  ?66 y.o. female quit smoking 2021 with  GOLD 3 COPD MZ, uncontrolled anxiety. Was on 02 one year PTA but "took herself off" per friend at bedside and able to care for pets at home but mostly housebound sinc [place] ? ?Patient presented to the ED with complaints of difficulty breathing, cough, wheezing x 1 week duration.  She also reported wheezing, and fatigue with O2 sats down to 78% on room air. ?She reported she was diagnosed with pneumonia one day PTA and given a shot of antibiotic continue on antibiotics and steroids. ?  ?EMS reported patient had rhonchi, O2 sats in the 80s on arrival to the ED.     ?  ?ED Course: Patient 98.8.  Heart rate 92-139.  Tachypneic with respiratory rate 33-37.  Blood pressure systolic Q000111Q to Q000111Q.  O2 sats 96 to 100% on 4 L.  O2 sats on room air not documented in ED.  Diffuse expiratory wheezing heard in ED. ?Patient was evaluated by RT in the ED and was placed on BiPAP. ?2 view chest x-ray without acute abnormality. ?IV Solu-Medrol, DuoNeb started.  Ativan 1 mg oral tablet given. ? ? ?Scheduled Meds: ? arformoterol  15 mcg Nebulization BID  ? budesonide (PULMICORT) nebulizer solution  0.5 mg Nebulization BID  ? busPIRone  15 mg Per Tube TID  ? chlorhexidine  15 mL Mouth Rinse BID  ? Chlorhexidine Gluconate Cloth  6 each Topical Q0600  ? clonazePAM  1 mg Per Tube BID  ? enoxaparin (LOVENOX) injection  40 mg Subcutaneous Q24H  ? feeding supplement (PROSource TF)  45 mL Per Tube Daily  ? free water  100 mL Per Tube Q6H  ? hydrALAZINE  25 mg Per Tube Q8H  ? insulin aspart  0-15 Units Subcutaneous Q4H  ? isosorbide dinitrate  20 mg Per Tube TID  ? mouth rinse  15 mL Mouth Rinse 10 times per day  ? methylPREDNISolone (SOLU-MEDROL) injection  40 mg Intravenous Q12H  ?  montelukast  10 mg Per Tube q AM  ? pantoprazole sodium  40 mg Per Tube Daily  ? QUEtiapine  50 mg Per Tube BID  ? revefenacin  175 mcg Nebulization Daily  ? ?Continuous Infusions: ? dexmedetomidine (PRECEDEX) IV infusion 1.3 mcg/kg/hr (10/02/21 0600)  ? feeding supplement (OSMOLITE 1.2 CAL) 1,000 mL (09/30/21 1534)  ? fentaNYL infusion INTRAVENOUS 150 mcg/hr (10/02/21 0600)  ? ketamine (KETALAR) Adult IV Infusion    ? ?PRN Meds:.acetaminophen **OR** acetaminophen, docusate, fentaNYL, guaiFENesin-dextromethorphan, levalbuterol, polyethylene glycol, polyethylene glycol, senna  ? ? ?Significant Hospital Events: ?Including procedures, antibiotic start and stop dates in addition to other pertinent events   ?ET  4/18 c/b hypotension with severe air trapping  ?Echo 4/20 with EF 40%  ?4/24 CTA head negative for any significant abnormality ? ?Interim History / Subjective:  ?No overnight events ?Tachypneic ?Does respond to verbal commands very slowly, able to stick out her tongue ? ?Objective   ?Blood pressure (!) 188/86, pulse (!) 112, temperature 98.2 ?F (36.8 ?C), temperature source Axillary, resp. rate (!) 32, height 5\' 1"  (1.549 m), weight 56.9 kg, SpO2 95 %. ?   ?Vent Mode: PRVC ?FiO2 (%):  [40 %] 40 % ?Set Rate:  [15 bmp] 15 bmp ?Vt Set:  [500 mL]  500 mL ?PEEP:  [5 cmH20] 5 cmH20 ?Plateau Pressure:  [16 cmH20-26 cmH20] 26 cmH20  ? ?Intake/Output Summary (Last 24 hours) at 10/02/2021 0818 ?Last data filed at 10/02/2021 0600 ?Gross per 24 hour  ?Intake 2230 ml  ?Output 1650 ml  ?Net 580 ml  ? ?Filed Weights  ? 09/30/21 0500 10/01/21 0325 10/02/21 0500  ?Weight: 58.4 kg 55.3 kg 56.9 kg  ? ? ?Examination: ?General-chronically ill-appearing, middle-aged, increased work of breathing ?HEENT-dry oral mucosa, endotracheal tube in place ?neck -trachea is midline ?respiratory -decreased air movement bilaterally, no wheezes decreased air movement bilaterally, no wheezing ?CV -S1-S2 appreciated with no murmur ?GI -bowel sounds  appreciated ?Neuro -sedated ? ?Chest x-ray 4/24 with no acute infiltrate-reviewed by myself ?ABG 7.4 5/65/64 ? ?I's and O's 9 L positive ? ?Assessment & Plan:  ? ?Acute hypoxemic/hypercapnic respiratory failure ?Gold stage III COPD ?-Target TVol 6-8cc/kgIBW ?-Target Plateau Pressure < 30cm H20 ?-Ventilator associated pneumonia prevention protocol ?-Not tolerating lower doses of sedation ?-Switched from Versed to Precedex ?-Repeat chest x-ray, obtain ABG ? ?Encephalopathy ?-I will start on ketamine for an analgesia/sedation ?-Attempt to wean down fentanyl ?-Wean down Precedex ?-Will hold BuSpar ?-Hold Klonopin ?-May get some bronchodilatation with ketamine as well ?-CT head negative on 4/24 ? ?Advanced chronic obstructive pulmonary disease ?-Brovana, Yupelri, Pulmicort ?-Remains on Solu-Medrol ?-Keep Solu-Medrol dose at 40 every 12 ? ?Significant history of anxiety ?-On Seroquel ?-We will hold Klonopin and BuSpar today ?-Being started on ketamine ? ?Transaminitis ?-Improving ?-Trend LFT ? ?Hypernatremia did resolve ?-With increased I/o will hold free water ? ?Agitation in a ventilated patient ?-RASS goal of 0-1 ?-Optimize fentanyl, optimize Precedex, ketamine added ? ?Heart failure with reduced ejection fraction ?-Repeat Lasix today ?-Attempt to decrease intake ?-Chest x-ray not showing any pulmonary congestion ? ?Thrombocytopenia ?-Related to acute illness ?-Stable ? ?Poor reserve at baseline ?Significant history of anxiety/agitation-this may make weaning more difficult ?Not tolerating weaning/25 ? ?Best Practice (right click and "Reselect all SmartList Selections" daily)  ? ?On PPI ?DVT prophylaxis ? ?Labs   ?CBC: ?Recent Labs  ?Lab 09/26/21 ?0424 09/27/21 ?0420 09/28/21 ?0411 09/28/21 ?2054 09/30/21 ?[id number] 10/01/21 ?0109 10/01/21 ?0912  ?WBC 8.7 8.8 10.5  --  13.2* 16.8*  --   ?NEUTROABS  --   --   --   --  11.9*  --   --   ?HGB 13.3 12.4 12.2 11.9* 11.7* 11.6* 11.9*  ?HCT 42.7 39.6 40.8 35.0* 36.3 35.8* 35.0*   ?MCV 102.2* 104.5* 105.7*  --  101.7* 100.8*  --   ?PLT 150 109* 113*  --  84* 89*  --   ? ? ?Basic Metabolic Panel: ?Recent Labs  ?Lab 09/26/21 ?1658 09/27/21 ?0420 09/27/21 ?1751 09/28/21 ?0411 09/28/21 ?2054 09/29/21 ?0130 09/29/21 ?2130 09/30/21 ?[id number] 10/01/21 ?0109 10/01/21 ?0912  ?NA  --  149*  --  151* 146* 145  --  141 141 138  ?K  --  4.9  --  5.0 3.9 4.2  --  4.9 5.0 4.6  ?CL  --  119*  --  120*  --  109  --  104 99  --   ?CO2  --  25  --  27  --  31  --  28 37*  --   ?GLUCOSE  --  156*  --  221*  --  160*  --  275* 194*  --   ?BUN  --  43*  --  44*  --  28*  --  30* 30*  --   ?  CREATININE  --  0.86  --  0.84  --  0.67  --  0.61 0.56  --   ?CALCIUM  --  8.7*  --  9.0  --  8.8*  --  8.9 8.8*  --   ?MG  --  2.6*  --  2.7*  --   --  2.3  --  2.4  --   ?PHOS 6.0* 3.4 3.1 3.3  --   --   --   --   --   --   ? ?GFR: ?Estimated Creatinine Clearance: 54.3 mL/min (by C-G formula based on SCr of 0.56 mg/dL). ?Recent Labs  ?Lab 09/26/21 ?0424 09/27/21 ?0420 09/28/21 ?0411 09/30/21 ?[id number] 10/01/21 ?0109  ?PROCALCITON  --  0.10  --   --   --   ?WBC 8.7 8.8 10.5 13.2* 16.8*  ?LATICACIDVEN 2.4*  --   --   --   --   ? ? ?Liver Function Tests: ?Recent Labs  ?Lab 09/27/21 ?0420 09/28/21 ?0411 09/29/21 ?0130 09/30/21 ?0235  ?AST 1,229* 504* 262* 126*  ?ALT 1,227* 1,123* 909* 739*  ?ALKPHOS 64 77 75 94  ?BILITOT 0.7 0.5 0.9 0.6  ?PROT 6.1* 6.7 5.6* 5.7*  ?ALBUMIN 3.3* 3.5 3.0* 3.1*  ? ?No results for input(s): LIPASE, AMYLASE in the last 168 hours. ?No results for input(s): AMMONIA in the last 168 hours. ? ?ABG ?   ?Component Value Date/Time  ? PHART 7.455 (H) 10/01/2021 0912  ? PCO2ART 65.8 ([place]) 10/01/2021 0912  ? PO2ART 64 (L) 10/01/2021 0912  ? HCO3 46.3 (H) 10/01/2021 0912  ? TCO2 48 (H) 10/01/2021 0912  ? ACIDBASEDEF 0.2 09/27/2021 0855  ? O2SAT 92 10/01/2021 0912  ?  ? ?Coagulation Profile: ?No results for input(s): INR, PROTIME in the last 168 hours. ? ?Cardiac Enzymes: ?No results for input(s): CKTOTAL, CKMB, CKMBINDEX,  TROPONINI in the last 168 hours. ? ?HbA1C: ?Hgb A1c MFr Bld  ?Date/Time Value Ref Range Status  ?09/25/2021 08:08 AM 5.4 4.8 - 5.6 % Final  ?  Comment:  ?  (NOTE) ?Pre diabetes:          5.7%-6.4% ? ?Diabetes:

## 2021-10-02 NOTE — Progress Notes (Signed)
eLink Physician-Brief Progress Note ?Patient Name: [name] ?DOB: 27-Dec-1955 ?MRN: [id number] ? ? ?Date of Service ? 10/02/2021  ?HPI/Events of Note ? Patient had a 24 beat run of very rapid narrow complex tachycardia with spontaneous termination, she was not agitated at the time, BP remained stable, she is currently in sinus rhythm at 110 bpm, she got 40 mg of Lasix earlier today raising  the possibility of electrolyte abnormalities.  ?eICU Interventions ? Will check a BMP and Mg++ to r/o depleted electrolytes.  ? ? ? ?  ? ?[name] ?10/02/2021, 11:09 PM ?

## 2021-10-03 ENCOUNTER — Inpatient Hospital Stay (HOSPITAL_COMMUNITY): Payer: Medicare Other

## 2021-10-03 DIAGNOSIS — J9601 Acute respiratory failure with hypoxia: Secondary | ICD-10-CM | POA: Diagnosis not present

## 2021-10-03 DIAGNOSIS — I5041 Acute combined systolic (congestive) and diastolic (congestive) heart failure: Secondary | ICD-10-CM | POA: Diagnosis not present

## 2021-10-03 DIAGNOSIS — J9602 Acute respiratory failure with hypercapnia: Secondary | ICD-10-CM | POA: Diagnosis not present

## 2021-10-03 DIAGNOSIS — J441 Chronic obstructive pulmonary disease with (acute) exacerbation: Secondary | ICD-10-CM | POA: Diagnosis not present

## 2021-10-03 DIAGNOSIS — E43 Unspecified severe protein-calorie malnutrition: Secondary | ICD-10-CM | POA: Insufficient documentation

## 2021-10-03 LAB — POCT I-STAT 7, (LYTES, BLD GAS, ICA,H+H)
Acid-Base Excess: 18 mmol/L — ABNORMAL HIGH (ref 0.0–2.0)
Acid-Base Excess: 13 mmol/L — ABNORMAL HIGH (ref 0.0–2.0)
Bicarbonate: 45 mmol/L — ABNORMAL HIGH (ref 20.0–28.0)
Bicarbonate: 42.8 mmol/L — ABNORMAL HIGH (ref 20.0–28.0)
Calcium, Ion: 1.19 mmol/L (ref 1.15–1.40)
Calcium, Ion: 1.25 mmol/L (ref 1.15–1.40)
HCT: 35 % — ABNORMAL LOW (ref 36.0–46.0)
HCT: 36 % (ref 36.0–46.0)
Hemoglobin: 11.9 g/dL — ABNORMAL LOW (ref 12.0–15.0)
Hemoglobin: 12.2 g/dL (ref 12.0–15.0)
O2 Saturation: 94 %
O2 Saturation: 94 %
Patient temperature: 98.6
Patient temperature: 98.6
Potassium: 4.6 mmol/L (ref 3.5–5.1)
Potassium: 4.3 mmol/L (ref 3.5–5.1)
Sodium: 144 mmol/L (ref 135–145)
Sodium: 146 mmol/L — ABNORMAL HIGH (ref 135–145)
TCO2: 47 mmol/L — ABNORMAL HIGH (ref 22–32)
TCO2: 45 mmol/L — ABNORMAL HIGH (ref 22–32)
pCO2 arterial: 64.2 mmHg — ABNORMAL HIGH (ref 32–48)
pCO2 arterial: 85.7 mmHg (ref 32–48)
pH, Arterial: 7.454 — ABNORMAL HIGH (ref 7.35–7.45)
pH, Arterial: 7.306 — ABNORMAL LOW (ref 7.35–7.45)
pO2, Arterial: 73 mmHg — ABNORMAL LOW (ref 83–108)
pO2, Arterial: 84 mmHg (ref 83–108)

## 2021-10-03 LAB — BASIC METABOLIC PANEL
Anion gap: 6 (ref 5–15)
Anion gap: 6 (ref 5–15)
BUN: 36 mg/dL — ABNORMAL HIGH (ref 8–23)
BUN: 38 mg/dL — ABNORMAL HIGH (ref 8–23)
CO2: 40 mmol/L — ABNORMAL HIGH (ref 22–32)
CO2: 40 mmol/L — ABNORMAL HIGH (ref 22–32)
Calcium: 8.7 mg/dL — ABNORMAL LOW (ref 8.9–10.3)
Calcium: 8.8 mg/dL — ABNORMAL LOW (ref 8.9–10.3)
Chloride: 100 mmol/L (ref 98–111)
Chloride: 100 mmol/L (ref 98–111)
Creatinine, Ser: 0.64 mg/dL (ref 0.44–1.00)
Creatinine, Ser: 0.68 mg/dL (ref 0.44–1.00)
GFR, Estimated: >60 mL/min (ref >60)
GFR, Estimated: >60 mL/min (ref >60)
Glucose, Bld: 195 mg/dL — ABNORMAL HIGH (ref 70–99)
Glucose, Bld: 196 mg/dL — ABNORMAL HIGH (ref 70–99)
Potassium: 4.8 mmol/L (ref 3.5–5.1)
Potassium: 5 mmol/L (ref 3.5–5.1)
Sodium: 146 mmol/L — ABNORMAL HIGH (ref 135–145)
Sodium: 146 mmol/L — ABNORMAL HIGH (ref 135–145)

## 2021-10-03 LAB — CBC WITH DIFFERENTIAL/PLATELET
Abs Immature Granulocytes: 0.27 10*3/uL — ABNORMAL HIGH (ref 0.00–0.07)
Basophils Absolute: 0 10*3/uL (ref 0.0–0.1)
Basophils Relative: 0 %
Eosinophils Absolute: 0 10*3/uL (ref 0.0–0.5)
Eosinophils Relative: 0 %
HCT: 38.2 % (ref 36.0–46.0)
Hemoglobin: 12 g/dL (ref 12.0–15.0)
Immature Granulocytes: 1 %
Lymphocytes Relative: 2 %
Lymphs Abs: 0.4 10*3/uL — ABNORMAL LOW (ref 0.7–4.0)
MCH: 32.1 pg (ref 26.0–34.0)
MCHC: 31.4 g/dL (ref 30.0–36.0)
MCV: 102.1 fL — ABNORMAL HIGH (ref 80.0–100.0)
Monocytes Absolute: 1 10*3/uL (ref 0.1–1.0)
Monocytes Relative: 4 %
Neutro Abs: 22.6 10*3/uL — ABNORMAL HIGH (ref 1.7–7.7)
Neutrophils Relative %: 93 %
Platelets: 104 10*3/uL — ABNORMAL LOW (ref 150–400)
RBC: 3.74 MIL/uL — ABNORMAL LOW (ref 3.87–5.11)
RDW: 13.6 % (ref 11.5–15.5)
WBC: 24.3 10*3/uL — ABNORMAL HIGH (ref 4.0–10.5)
nRBC: 0 % (ref 0.0–0.2)

## 2021-10-03 LAB — GLUCOSE, CAPILLARY
Glucose-Capillary: 131 mg/dL — ABNORMAL HIGH (ref 70–99)
Glucose-Capillary: 191 mg/dL — ABNORMAL HIGH (ref 70–99)
Glucose-Capillary: 193 mg/dL — ABNORMAL HIGH (ref 70–99)
Glucose-Capillary: 199 mg/dL — ABNORMAL HIGH (ref 70–99)
Glucose-Capillary: 227 mg/dL — ABNORMAL HIGH (ref 70–99)
Glucose-Capillary: 275 mg/dL — ABNORMAL HIGH (ref 70–99)

## 2021-10-03 LAB — HEPATIC FUNCTION PANEL
ALT: 339 U/L — ABNORMAL HIGH (ref 0–44)
AST: 58 U/L — ABNORMAL HIGH (ref 15–41)
Albumin: 3 g/dL — ABNORMAL LOW (ref 3.5–5.0)
Alkaline Phosphatase: 135 U/L — ABNORMAL HIGH (ref 38–126)
Bilirubin, Direct: 0.1 mg/dL (ref 0.0–0.2)
Indirect Bilirubin: 0.4 mg/dL (ref 0.3–0.9)
Total Bilirubin: 0.5 mg/dL (ref 0.3–1.2)
Total Protein: 6.1 g/dL — ABNORMAL LOW (ref 6.5–8.1)

## 2021-10-03 LAB — BLOOD GAS, ARTERIAL
Acid-Base Excess: 12.7 mmol/L — ABNORMAL HIGH (ref 0.0–2.0)
Bicarbonate: 46.2 mmol/L — ABNORMAL HIGH (ref 20.0–28.0)
Drawn by: 33099
O2 Saturation: 92.9 %
Patient temperature: 36.9
pCO2 arterial: 120 mmHg (ref 32–48)
pH, Arterial: 7.19 — CL (ref 7.35–7.45)
pO2, Arterial: 70 mmHg — ABNORMAL LOW (ref 83–108)

## 2021-10-03 LAB — MAGNESIUM: Magnesium: 2.7 mg/dL — ABNORMAL HIGH (ref 1.7–2.4)

## 2021-10-03 MED ORDER — ACETAZOLAMIDE 250 MG PO TABS
500.0000 mg | ORAL_TABLET | Freq: Once | ORAL | Status: AC
  Administered 2021-10-03: 500 mg
  Filled 2021-10-03: qty 2

## 2021-10-03 MED ORDER — QUETIAPINE FUMARATE 100 MG PO TABS
100.0000 mg | ORAL_TABLET | Freq: Two times a day (BID) | ORAL | Status: DC
Start: 2021-10-03 — End: 2021-10-12
  Administered 2021-10-03 – 2021-10-12 (×18): 100 mg
  Filled 2021-10-03 (×18): qty 1

## 2021-10-03 MED ORDER — SODIUM CHLORIDE 0.9 % IV SOLN
INTRAVENOUS | Status: DC | PRN
Start: 2021-10-03 — End: 2021-11-09

## 2021-10-03 MED ORDER — FUROSEMIDE 10 MG/ML IJ SOLN
40.0000 mg | Freq: Two times a day (BID) | INTRAMUSCULAR | Status: DC
  Administered 2021-10-03 – 2021-10-05 (×5): 40 mg via INTRAVENOUS
  Filled 2021-10-03 (×5): qty 4

## 2021-10-03 MED ORDER — CHLORHEXIDINE GLUCONATE 0.12% ORAL RINSE (MEDLINE KIT)
15.0000 mL | Freq: Two times a day (BID) | OROMUCOSAL | Status: DC
  Administered 2021-10-03 – 2021-11-14 (×77): 15 mL via OROMUCOSAL
  Filled 2021-10-03: qty 15

## 2021-10-03 MED ORDER — ORAL CARE MOUTH RINSE
15.0000 mL | OROMUCOSAL | Status: DC
  Administered 2021-10-03 – 2021-10-21 (×163): 15 mL via OROMUCOSAL

## 2021-10-03 MED ORDER — SODIUM CHLORIDE 0.9 % IV SOLN
1.0000 g | INTRAVENOUS | Status: DC
  Administered 2021-10-03 – 2021-10-04 (×2): 1 g via INTRAVENOUS
  Filled 2021-10-03 (×2): qty 10

## 2021-10-03 NOTE — Progress Notes (Signed)
eLink Physician-Brief Progress Note ?Patient Name: [name] ?DOB: [birth year] ?MRN: [id number] ? ? ?Date of Service ? 10/03/2021  ?HPI/Events of Note ? ABG reviewed.  ?eICU Interventions ? RT to increase the RR to 28 and obtain an ABG in one hour after the change.  ? ? ? ?  ? ?[name] ?10/03/2021, 9:35 PM ?

## 2021-10-03 NOTE — Progress Notes (Signed)
? ?NAME:  [name], MRN:  [id number], DOB:  May 10, 1956, LOS: 8 ?ADMISSION DATE:  09/24/2021, CONSULTATION DATE:  4/18 ?REFERRING MD:  Tat/ triad, CHIEF COMPLAINT:  resp distress   ? ?History of Present Illness:  ?66 y.o. female quit smoking 2021 with  GOLD 3 COPD MZ, uncontrolled anxiety. Was on 02 one year PTA but "took herself off" per friend at bedside and able to care for pets at home but mostly housebound sinc [place] ? ?Patient presented to the ED with complaints of difficulty breathing, cough, wheezing x 1 week duration.  She also reported wheezing, and fatigue with O2 sats down to 78% on room air. ?She reported she was diagnosed with pneumonia one day PTA and given a shot of antibiotic continue on antibiotics and steroids. ?  ?EMS reported patient had rhonchi, O2 sats in the 80s on arrival to the ED.     ?  ?ED Course: Patient 98.8.  Heart rate 92-139.  Tachypneic with respiratory rate 33-37.  Blood pressure systolic 725D to 664Q.  O2 sats 96 to 100% on 4 L.  O2 sats on room air not documented in ED.  Diffuse expiratory wheezing heard in ED. ?Patient was evaluated by RT in the ED and was placed on BiPAP. ?2 view chest x-ray without acute abnormality. ?IV Solu-Medrol, DuoNeb started.  Ativan 1 mg oral tablet given. ? ? ?Scheduled Meds: ? arformoterol  15 mcg Nebulization BID  ? budesonide (PULMICORT) nebulizer solution  0.5 mg Nebulization BID  ? chlorhexidine gluconate (MEDLINE KIT)  15 mL Mouth Rinse BID  ? Chlorhexidine Gluconate Cloth  6 each Topical Q0600  ? enoxaparin (LOVENOX) injection  40 mg Subcutaneous Q24H  ? feeding supplement (PROSource TF)  45 mL Per Tube Daily  ? hydrALAZINE  25 mg Per Tube Q8H  ? insulin aspart  0-20 Units Subcutaneous Q4H  ? isosorbide dinitrate  20 mg Per Tube TID  ? mouth rinse  15 mL Mouth Rinse 10 times per day  ? methylPREDNISolone (SOLU-MEDROL) injection  40 mg Intravenous Q12H  ? montelukast  10 mg Per Tube q AM  ? pantoprazole sodium  40 mg Per Tube Daily  ?  QUEtiapine  50 mg Per Tube BID  ? revefenacin  175 mcg Nebulization Daily  ? ?Continuous Infusions: ? dexmedetomidine (PRECEDEX) IV infusion 1.3 mcg/kg/hr (10/03/21 0600)  ? feeding supplement (OSMOLITE 1.2 CAL) 1,000 mL (10/02/21 0900)  ? fentaNYL infusion INTRAVENOUS 125 mcg/hr (10/03/21 0600)  ? ketamine (KETALAR) Adult IV Infusion 0.5 mg/kg/hr (10/03/21 0600)  ? ?PRN Meds:.acetaminophen **OR** acetaminophen, docusate, fentaNYL, guaiFENesin-dextromethorphan, levalbuterol, polyethylene glycol, polyethylene glycol, senna  ? ? ?Significant Hospital Events: ?Including procedures, antibiotic start and stop dates in addition to other pertinent events   ?ET  4/18 c/b hypotension with severe air trapping  ?Echo 4/20 with EF 40%  ?4/24 CTA head negative for any significant abnormality ? ?Interim History / Subjective:  ?No overnight events ?Did have an episode of narrow complex tachycardia last evening ?She does respond to some verbal commands today-wiggled her toes, try to stick out her tongue ? ?Objective   ?Blood pressure (!) 174/72, pulse 96, temperature (!) 97.5 ?F (36.4 ?C), temperature source Oral, resp. rate (!) 22, height _0  (1.549 m), weight 56.1 kg, SpO2 93 %. ?   ?Vent Mode: PRVC ?FiO2 (%):  [40 %] 40 % ?Set Rate:  [15 bmp] 15 bmp ?Vt Set:  [500 mL] 500 mL ?PEEP:  [5 cmH20] 5 cmH20 ?Plateau Pressure:  [  [place] ?Intake/Output Summary (Last 24 hours) at 10/03/2021 0804 ?Last data filed at 10/03/2021 0600 ?Gross per 24 hour  ?Intake 2092.13 ml  ?Output 2025 ml  ?Net 67.13 ml  ? ?Filed Weights  ? 10/01/21 0325 10/02/21 0500 10/03/21 0426  ?Weight: 55.3 kg 56.9 kg 56.1 kg  ? ? ?Examination: ?General-chronically ill-appearing ?HEENT-dry oral mucosa with endotracheal tube in place  ?neck -trachea is midline ?respiratory -decreased air movement bilaterally, no wheezes, no rhonchi ?CV -S1-S2 appreciated ?GI -bowel sounds appreciated ?Neuro -sedated but arousable ? ?Chest x-ray 4/24 with no acute  infiltrate-reviewed by myself ?ABG 4/26--7.45/64/73 ? ? ?I's and O's 9 L positive ?Diuresed well ? ?Assessment & Plan:  ? ?Acute hypoxemic/hypercapnic respiratory failure ?Gold stage III COPD ?-Target TVol 6-8cc/kgIBW ?-Target Plateau Pressure < 30cm H20 ?-Ventilator associated pneumonia prevention protocol ?-Not tolerating lower doses of sedation ?-Currently on Precedex, fentanyl, ketamine ?-We will continue to attempt to decrease sedation as tolerated ?-Chest x-ray with increased bronchovascular mask changes at the bases, ABG has been stable ? ?Encephalopathy ?-Attempting to optimize medications as tolerated ?-Continue ketamine, decrease dose of sedation as possible ?-Held BuSpar, Klonopin ?-CT head negative ?-On Seroquel ? ?Advanced chronic obstructive pulmonary disease ?-Continue Brovana, Yupelri, Pulmicort ?-Remains on Solu-Medrol at 40 every 12 ? ?Leukocytosis ?-No new fever ?-No significant secretions ?-Leukocytosis was felt to be related to steroids however continues to trend higher ?-Empirically start Rocephin ?-Did complete azithromycin ?-Send respiratory aspirate for cultures ?-Starting Rocephin 1/5 ? ?She does have a significant history of anxiety at baseline ? ?Transaminitis ?-LFT trend improving ? ?Hypernatremia ?-Improved ? ?Heart failure with reduced ejection fraction ?-Has been on Lasix ?-Will increase Lasix to twice a day ? ?Thrombocytopenia ?-Stable ? ?She has very poor reserves at baseline ?History of significant anxiety/agitation is making weaning more difficult ?Did not tolerate weaning today ?Updated sister at bedside 4/25 2023 ? ? ?Best Practice (right click and "Reselect all SmartList Selections" daily)  ? ?On PPI ?DVT prophylaxis ? ?Labs   ?CBC: ?Recent Labs  ?Lab 09/27/21 ?0420 09/28/21 ?0411 09/28/21 ?2054 09/30/21 ?7124 10/01/21 ?0109 10/01/21 ?0912 10/03/21 ?0155 10/03/21 ?0225  ?WBC 8.8 10.5  --  13.2* 16.8*  --  24.3*  --   ?NEUTROABS  --   --   --  11.9*  --   --  22.6*  --   ?HGB  12.4 12.2   < > 11.7* 11.6* 11.9* 12.0 11.9*  ?HCT 39.6 40.8   < > 36.3 35.8* 35.0* 38.2 35.0*  ?MCV 104.5* 105.7*  --  101.7* 100.8*  --  102.1*  --   ?PLT 109* 113*  --  84* 89*  --  104*  --   ? < > = values in this interval not displayed.  ? ? ?Basic Metabolic Panel: ?Recent Labs  ?Lab 09/26/21 ?1658 09/27/21 ?0420 09/27/21 ?0420 09/27/21 ?1751 09/28/21 ?0411 09/28/21 ?2054 09/29/21 ?2130 09/30/21 ?5809 10/01/21 ?0109 10/01/21 ?9833 10/02/21 ?8250 10/02/21 ?2336 10/03/21 ?0155 10/03/21 ?0225  ?NA  --  149*   < >  --  151*   < >  --  141 141 138 143 146* 146* 144  ?K  --  4.9   < >  --  5.0   < >  --  4.9 5.0 4.6 5.2* 5.0 4.8 4.6  ?CL  --  119*   < >  --  120*   < >  --  104 99  --  99 100 100  --   ?  CO2  --  25   < >  --  27   < >  --  28 37*  --  37* 40* 40*  --   ?GLUCOSE  --  156*   < >  --  221*   < >  --  275* 194*  --  247* 195* 196*  --   ?BUN  --  43*   < >  --  44*   < >  --  30* 30*  --  36* 38* 36*  --   ?CREATININE  --  0.86   < >  --  0.84   < >  --  0.61 0.56  --  0.62 0.68 0.64  --   ?CALCIUM  --  8.7*   < >  --  9.0   < >  --  8.9 8.8*  --  8.9 8.7* 8.8*  --   ?MG  --  2.6*  --   --  2.7*  --  2.3  --  2.4  --   --  2.7*  --   --   ?PHOS 6.0* 3.4  --  3.1 3.3  --   --   --   --   --   --   --   --   --   ? < > = values in this interval not displayed.  ? ?GFR: ?Estimated Creatinine Clearance: 54.3 mL/min (by C-G formula based on SCr of 0.64 mg/dL). ?Recent Labs  ?Lab 09/27/21 ?0420 09/28/21 ?0411 09/30/21 ?6256 10/01/21 ?0109 10/03/21 ?0155  ?PROCALCITON 0.10  --   --   --   --   ?WBC 8.8 10.5 13.2* 16.8* 24.3*  ? ? ?Liver Function Tests: ?Recent Labs  ?Lab 09/27/21 ?0420 09/28/21 ?0411 09/29/21 ?0130 09/30/21 ?3893 10/03/21 ?0155  ?AST 1,229* 504* 262* 126* 58*  ?ALT 1,227* 1,123* 909* 739* 339*  ?ALKPHOS 64 77 75 94 135*  ?BILITOT 0.7 0.5 0.9 0.6 0.5  ?PROT 6.1* 6.7 5.6* 5.7* 6.1*  ?ALBUMIN 3.3* 3.5 3.0* 3.1* 3.0*  ? ?No results for input(s): LIPASE, AMYLASE in the last 168 hours. ?No results  for input(s): AMMONIA in the last 168 hours. ? ?ABG ?   ?Component Value Date/Time  ? PHART 7.454 (H) 10/03/2021 0225  ? PCO2ART 64.2 (H) 10/03/2021 0225  ? PO2ART 73 (L) 10/03/2021 0225  ? HCO3 45.0 (H) 04/26/

## 2021-10-03 NOTE — Progress Notes (Signed)
? ?Progress Note ? ?Patient Name: [name] ?Date of Encounter: 10/03/2021 ? ?[facility] Cardiologist: Dorris Carnes, MD  ? ?Subjective  ? ?Had brief SVT overnight. Team adjusting sedation. No other acute events. ? ?Inpatient Medications  ?  ?Scheduled Meds: ? acetaZOLAMIDE  500 mg Per Tube Once  ? arformoterol  15 mcg Nebulization BID  ? budesonide (PULMICORT) nebulizer solution  0.5 mg Nebulization BID  ? chlorhexidine gluconate (MEDLINE KIT)  15 mL Mouth Rinse BID  ? Chlorhexidine Gluconate Cloth  6 each Topical Q0600  ? enoxaparin (LOVENOX) injection  40 mg Subcutaneous Q24H  ? feeding supplement (PROSource TF)  45 mL Per Tube Daily  ? furosemide  40 mg Intravenous BID  ? hydrALAZINE  25 mg Per Tube Q8H  ? insulin aspart  0-20 Units Subcutaneous Q4H  ? isosorbide dinitrate  20 mg Per Tube TID  ? mouth rinse  15 mL Mouth Rinse 10 times per day  ? methylPREDNISolone (SOLU-MEDROL) injection  40 mg Intravenous Q12H  ? montelukast  10 mg Per Tube q AM  ? pantoprazole sodium  40 mg Per Tube Daily  ? QUEtiapine  50 mg Per Tube BID  ? revefenacin  175 mcg Nebulization Daily  ? ?Continuous Infusions: ? sodium chloride    ? cefTRIAXone (ROCEPHIN)  IV    ? dexmedetomidine (PRECEDEX) IV infusion 1.5 mcg/kg/hr (10/03/21 8366)  ? feeding supplement (OSMOLITE 1.2 CAL) 1,000 mL (10/02/21 0900)  ? fentaNYL infusion INTRAVENOUS 400 mcg/hr (10/03/21 0839)  ? ketamine (KETALAR) Adult IV Infusion 0.5 mg/kg/hr (10/03/21 0839)  ? ?PRN Meds: ?sodium chloride, acetaminophen **OR** acetaminophen, docusate, fentaNYL, guaiFENesin-dextromethorphan, levalbuterol, polyethylene glycol, polyethylene glycol, senna  ? ?Vital Signs  ?  ?Vitals:  ? 10/03/21 0504 10/03/21 0731 10/03/21 0736 10/03/21 0800  ?BP: (!) 174/72  132/87 (!) 188/88  ?Pulse:   94 (!) 102  ?Resp:   16 (!) 25  ?Temp:  (!) 97.5 ?F (36.4 ?C)    ?TempSrc:  Oral    ?SpO2:   92% 91%  ?Weight:      ?Height:      ? ? ?Intake/Output Summary (Last 24 hours) at 10/03/2021 0912 ?Last  data filed at 10/03/2021 2947 ?Gross per 24 hour  ?Intake 2273.59 ml  ?Output 2025 ml  ?Net 248.59 ml  ? ? ?  10/03/2021  ?  4:26 AM 10/02/2021  ?  5:00 AM 10/01/2021  ?  3:25 AM  ?Last 3 Weights  ?Weight (lbs) 123 lb 10.9 oz 125 lb 7.1 oz 121 lb 14.6 oz  ?Weight (kg) 56.1 kg 56.9 kg 55.3 kg  ?   ? ?Telemetry  ?  ?Brief pSVT overnight - Personally Reviewed ? ?ECG  ?  ?No new since 4/17 - Personally Reviewed ? ?Physical Exam  ? ?GEN: intubated, sedated ?MUSCULOSKELETAL:  nonfocal ?SKIN: Warm and dry, no edema ?NEUROLOGIC:  sedated ?PSYCHIATRIC:  no agitation currently ? ?Getting bathed/adjusted ? ?Labs  ?  ?High Sensitivity Troponin:   ?Recent Labs  ?Lab 09/24/21 ?1707 09/24/21 ?1931  ?TROPONINIHS 7 12  ?   ?Chemistry ?Recent Labs  ?Lab 09/29/21 ?0130 09/29/21 ?2130 09/30/21 ?6546 10/01/21 ?0109 10/01/21 ?5035 10/02/21 ?4656 10/02/21 ?2336 10/03/21 ?0155 10/03/21 ?0225  ?NA 145  --  141 141   < > 143 146* 146* 144  ?K 4.2  --  4.9 5.0   < > 5.2* 5.0 4.8 4.6  ?CL 109  --  104 99  --  99 100 100  --   ?  CO2 31  --  28 37*  --  37* 40* 40*  --   ?GLUCOSE 160*  --  275* 194*  --  247* 195* 196*  --   ?BUN 28*  --  30* 30*  --  36* 38* 36*  --   ?CREATININE 0.67  --  0.61 0.56  --  0.62 0.68 0.64  --   ?CALCIUM 8.8*  --  8.9 8.8*  --  8.9 8.7* 8.8*  --   ?MG  --  2.3  --  2.4  --   --  2.7*  --   --   ?PROT 5.6*  --  5.7*  --   --   --   --  6.1*  --   ?ALBUMIN 3.0*  --  3.1*  --   --   --   --  3.0*  --   ?AST 262*  --  126*  --   --   --   --  58*  --   ?ALT 909*  --  739*  --   --   --   --  339*  --   ?ALKPHOS 75  --  94  --   --   --   --  135*  --   ?BILITOT 0.9  --  0.6  --   --   --   --  0.5  --   ?GFRNONAA >60  --  >60 >60  --  >60 >60 >60  --   ?ANIONGAP 5  --  9 5  --  '7 6 6  ' --   ? < > = values in this interval not displayed.  ?  ?Lipids No results for input(s): CHOL, TRIG, HDL, LABVLDL, LDLCALC, CHOLHDL in the last 168 hours.  ?Hematology ?Recent Labs  ?Lab 09/30/21 ?3567 10/01/21 ?0109 10/01/21 ?0912  10/03/21 ?0155 10/03/21 ?0225  ?WBC 13.2* 16.8*  --  24.3*  --   ?RBC 3.57* 3.55*  --  3.74*  --   ?HGB 11.7* 11.6* 11.9* 12.0 11.9*  ?HCT 36.3 35.8* 35.0* 38.2 35.0*  ?MCV 101.7* 100.8*  --  102.1*  --   ?MCH 32.8 32.7  --  32.1  --   ?MCHC 32.2 32.4  --  31.4  --   ?RDW 13.0 13.2  --  13.6  --   ?PLT 84* 89*  --  104*  --   ? ?Thyroid No results for input(s): TSH, FREET4 in the last 168 hours.  ?BNP ?Recent Labs  ?Lab 09/27/21 ?0420  ?BNP 1,052.0*  ?  ?DDimer No results for input(s): DDIMER in the last 168 hours.  ? ?Radiology  ?  ?CT HEAD WO CONTRAST (5MM) ? ?Result Date: 10/01/2021 ?CLINICAL DATA:  Altered mental status, nontraumatic EXAM: CT HEAD WITHOUT CONTRAST TECHNIQUE: Contiguous axial images were obtained from the base of the skull through the vertex without intravenous contrast. RADIATION DOSE REDUCTION: This exam was performed according to the departmental dose-optimization program which includes automated exposure control, adjustment of the mA and/or kV according to patient size and/or use of iterative reconstruction technique. COMPARISON:  None. FINDINGS: Brain: There is no acute intracranial hemorrhage, mass effect, or edema. Gray-white differentiation is preserved. There is no extra-axial fluid collection. Ventricles and sulci are within normal limits in size and configuration. Vascular: There is atherosclerotic calcification at the skull base. Skull: Calvarium is unremarkable. Sinuses/Orbits: Patchy mucosal thickening.  Orbits are unremarkable. Other: None. IMPRESSION: No acute intracranial abnormality. Electronically  Signed   By: Macy Mis M.D.   On: 10/01/2021 16:13  ? ?DG Chest Port 1 View ? ?Result Date: 10/03/2021 ?CLINICAL DATA:  Respiratory failure. EXAM: PORTABLE CHEST 1 VIEW COMPARISON:  Radiograph October 01, 2021 FINDINGS: Endotracheal tube with tip projecting over the midthoracic trachea. Enteric tube coursing below the diaphragm with tip and side port excluded by collimation. The  heart size and mediastinal contours are unchanged. Similar chronic bilateral interstitial thickening at the lung bases with slightly increased patchy bibasilar airspace opacities. No visible pleural effusion or pneumothorax. No acute osseous abnormality IMPRESSION: Similar chronic bilateral interstitial thickening at the lung bases with slightly increased patchy bibasilar airspace opacities, possibly reflecting atelectasis or infection/aspiration. Electronically Signed   By: Dahlia Bailiff M.D.   On: 10/03/2021 07:56   ? ?Cardiac Studies  ? ?Echo:  4/202/23 ?  ?1. Limited visualization of the myocardium, incomplete assessment of wall  ?motion. Anteroseptal wall is akinetic. Apex is akinetic. Marland Kitchen Left  ?ventricular ejection fraction, by estimation, is 40%. The left ventricle  ?has mildly decreased function. The left  ?ventricle demonstrates regional wall motion abnormalities (see scoring  ?diagram/findings for description). Left ventricular diastolic parameters  ?are consistent with Grade I diastolic dysfunction (impaired relaxation).  ? 2. Right ventricular systolic function is normal. The right ventricular  ?size is normal. There is normal pulmonary artery systolic pressure.  ? 3. The mitral valve is normal in structure. No evidence of mitral valve  ?regurgitation. No evidence of mitral stenosis.  ? 4. The tricuspid valve is abnormal.  ? 5. The aortic valve is tricuspid. Aortic valve regurgitation is not  ?visualized. No aortic stenosis is present.  ? 6. The inferior vena cava is normal in size with greater than 50%  ?respiratory variability, suggesting right atrial pressure of 3 mmHg.  ? ?Patient Profile  ?   ?66 y.o. 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 Carles Collet. ? ?Assessment & Plan  ?  ?Cardiomyopathy ?Acute systolic and diastolic heart failure ?Hypertension ?-EF 40% with focal wall motion abnormalities.  Concern for ischemic etiology ?-elevated BNP ?-on isordil 20 mg TID, hydralazine  25 mg q8 hours. Was not on home BP medication prior to admission. Fluctuation in BP likely related to agitation ?-potassium consistentl

## 2021-10-03 NOTE — Progress Notes (Signed)
Updated patient's sister about patient's difficult weaning process ? ?Was able to commands this morning ? ?Requiring increasing sedation ? ?Tracheostomy discussed as an option going forward ? ?She is agreeable to tracheostomy but wants to discuss this with the patient's spouse before we move forward ?-Plan will be possible tracheostomy early next week ?

## 2021-10-03 NOTE — Progress Notes (Signed)
Nutrition Follow-up ? ?DOCUMENTATION CODES:  ? ?Severe malnutrition in context of chronic illness ? ?INTERVENTION:  ? ?Continue TF via OG tube: ?Osmolite 1.2 at 55 ml/h (1320 ml per day) ?Prosource TF 45 ml once daily ? ?Provides 1624 kcal, 84 gm protein, 1082 ml free water daily. ? ?NUTRITION DIAGNOSIS:  ? ?Severe Malnutrition related to chronic illness (COPD) as evidenced by severe muscle depletion, severe fat depletion. ? ?Ongoing  ? ?GOAL:  ? ?Patient will meet greater than or equal to 90% of their needs ? ?Met with TF ? ?MONITOR:  ? ?Vent status, TF tolerance, Labs ? ?REASON FOR ASSESSMENT:  ? ?Ventilator ?  ? ?ASSESSMENT:  ? ?Pt admitted with difficulty breathing d/t acute respiratory failure with hypoxia and hypercapnia. Pt with diagnosis of PNA 4/16. PMH significant for COPD, uncontrolled anxiety. ? ?Discussed patient in ICU rounds and with RN today. ?Patient has severe COPD and her anxiety is causing difficulty with vent weaning. ?Following commands this morning. ? ?OG tube in place. Currently receiving Osmolite 1.2 at 55 ml/h with Prosource TF 45 ml once daily to provide 1624 kcal, 84 gm protein, 1082 ml free water daily. ? ?Patient is currently intubated on ventilator support. ?MV: 7.3 L/min ?Temp (24hrs), Avg:98.3 ?F (36.8 ?C), Min:97.5 ?F (36.4 ?C), Max:99.1 ?F (37.3 ?C) ? ?  ?Labs reviewed.  ?CBG: [phone] ? ?Medications reviewed and include fentanyl, precedex, ketamine. ? ?Recent weights reviewed. No significant weight changes noted.  ? ?NUTRITION - FOCUSED PHYSICAL EXAM: ? ?Flowsheet Row Most Recent Value  ?Orbital Region Severe depletion  ?Upper Arm Region No depletion  ?Thoracic and Lumbar Region No depletion  ?Buccal Region Severe depletion  ?Temple Region Severe depletion  ?Clavicle Bone Region Severe depletion  ?Clavicle and Acromion Bone Region Severe depletion  ?Scapular Bone Region Severe depletion  ?Dorsal Hand No depletion  ?Patellar Region Moderate depletion  ?Anterior Thigh Region  Moderate depletion  ?Posterior Calf Region Moderate depletion  ?Edema (RD Assessment) Moderate  ?Hair Reviewed  ?Eyes Unable to assess  ?Mouth Unable to assess  ?Skin Reviewed  ?Nails Reviewed  ? ?  ? ? ?Diet Order:   ?Diet Order   ? ?       ?  Diet NPO time specified  Diet effective now       ?  ? ?  ?  ? ?  ? ? ?EDUCATION NEEDS:  ? ?No education needs have been identified at this time ? ?Skin:  Skin Assessment: Reviewed RN Assessment ? ?Last BM:  4/17 ? ?Height:  ? ?Ht Readings from Last 1 Encounters:  ?09/25/21 '5\' 1"'  (1.549 m)  ? ? ?Weight:  ? ?Wt Readings from Last 1 Encounters:  ?10/03/21 56.1 kg  ? ? ? ?BMI:  Body mass index is 23.37 kg/m?. ? ?Estimated Nutritional Needs:  ? ?Kcal:  1500-1700 ? ?Protein:  75-90g ? ?Fluid:  >/=1.5L ? ? ? ?Lucas Mallow RD, LDN, CNSC ?Please refer to Amion for contact information.                                                       ? ?

## 2021-10-03 NOTE — Progress Notes (Signed)
ABG collected early due to pt having periods of desaturation, and just overall looking worse. Sample ran on iSTAT, but result was missing values, so sample sent to lab to run. RT will continue to monitor.  ?

## 2021-10-04 ENCOUNTER — Encounter (HOSPITAL_COMMUNITY): Payer: Medicare Other

## 2021-10-04 DIAGNOSIS — J9601 Acute respiratory failure with hypoxia: Secondary | ICD-10-CM | POA: Diagnosis not present

## 2021-10-04 DIAGNOSIS — J9602 Acute respiratory failure with hypercapnia: Secondary | ICD-10-CM | POA: Diagnosis not present

## 2021-10-04 LAB — CBC WITH DIFFERENTIAL/PLATELET
Abs Immature Granulocytes: 0.31 10*3/uL — ABNORMAL HIGH (ref 0.00–0.07)
Basophils Absolute: 0 10*3/uL (ref 0.0–0.1)
Basophils Relative: 0 %
Eosinophils Absolute: 0 10*3/uL (ref 0.0–0.5)
Eosinophils Relative: 0 %
HCT: 39.1 % (ref 36.0–46.0)
Hemoglobin: 11.8 g/dL — ABNORMAL LOW (ref 12.0–15.0)
Immature Granulocytes: 1 %
Lymphocytes Relative: 2 %
Lymphs Abs: 0.5 10*3/uL — ABNORMAL LOW (ref 0.7–4.0)
MCH: 32.2 pg (ref 26.0–34.0)
MCHC: 30.2 g/dL (ref 30.0–36.0)
MCV: 106.5 fL — ABNORMAL HIGH (ref 80.0–100.0)
Monocytes Absolute: 1.6 10*3/uL — ABNORMAL HIGH (ref 0.1–1.0)
Monocytes Relative: 6 %
Neutro Abs: 24.5 10*3/uL — ABNORMAL HIGH (ref 1.7–7.7)
Neutrophils Relative %: 91 %
Platelets: 117 10*3/uL — ABNORMAL LOW (ref 150–400)
RBC: 3.67 MIL/uL — ABNORMAL LOW (ref 3.87–5.11)
RDW: 14 % (ref 11.5–15.5)
WBC: 26.9 10*3/uL — ABNORMAL HIGH (ref 4.0–10.5)
nRBC: 0 % (ref 0.0–0.2)

## 2021-10-04 LAB — POCT I-STAT 7, (LYTES, BLD GAS, ICA,H+H)
Acid-Base Excess: 13 mmol/L — ABNORMAL HIGH (ref 0.0–2.0)
Acid-Base Excess: 11 mmol/L — ABNORMAL HIGH (ref 0.0–2.0)
Bicarbonate: 42.3 mmol/L — ABNORMAL HIGH (ref 20.0–28.0)
Bicarbonate: 40.7 mmol/L — ABNORMAL HIGH (ref 20.0–28.0)
Calcium, Ion: 1.26 mmol/L (ref 1.15–1.40)
Calcium, Ion: 1.26 mmol/L (ref 1.15–1.40)
HCT: 37 % (ref 36.0–46.0)
HCT: 36 % (ref 36.0–46.0)
Hemoglobin: 12.6 g/dL (ref 12.0–15.0)
Hemoglobin: 12.2 g/dL (ref 12.0–15.0)
O2 Saturation: 96 %
O2 Saturation: 97 %
Patient temperature: 98.9
Patient temperature: 98.1
Potassium: 4.2 mmol/L (ref 3.5–5.1)
Potassium: 4.3 mmol/L (ref 3.5–5.1)
Sodium: 146 mmol/L — ABNORMAL HIGH (ref 135–145)
Sodium: 149 mmol/L — ABNORMAL HIGH (ref 135–145)
TCO2: 45 mmol/L — ABNORMAL HIGH (ref 22–32)
TCO2: 43 mmol/L — ABNORMAL HIGH (ref 22–32)
pCO2 arterial: 82 mmHg (ref 32–48)
pCO2 arterial: 82.4 mmHg (ref 32–48)
pH, Arterial: 7.322 — ABNORMAL LOW (ref 7.35–7.45)
pH, Arterial: 7.3 — ABNORMAL LOW (ref 7.35–7.45)
pO2, Arterial: 96 mmHg (ref 83–108)
pO2, Arterial: 103 mmHg (ref 83–108)

## 2021-10-04 LAB — BASIC METABOLIC PANEL
Anion gap: 6 (ref 5–15)
BUN: 40 mg/dL — ABNORMAL HIGH (ref 8–23)
CO2: 40 mmol/L — ABNORMAL HIGH (ref 22–32)
Calcium: 9 mg/dL (ref 8.9–10.3)
Chloride: 102 mmol/L (ref 98–111)
Creatinine, Ser: 0.66 mg/dL (ref 0.44–1.00)
GFR, Estimated: >60 mL/min (ref >60)
Glucose, Bld: 151 mg/dL — ABNORMAL HIGH (ref 70–99)
Potassium: 4.3 mmol/L (ref 3.5–5.1)
Sodium: 148 mmol/L — ABNORMAL HIGH (ref 135–145)

## 2021-10-04 LAB — MAGNESIUM: Magnesium: 2.5 mg/dL — ABNORMAL HIGH (ref 1.7–2.4)

## 2021-10-04 LAB — GLUCOSE, CAPILLARY
Glucose-Capillary: 176 mg/dL — ABNORMAL HIGH (ref 70–99)
Glucose-Capillary: 170 mg/dL — ABNORMAL HIGH (ref 70–99)
Glucose-Capillary: 163 mg/dL — ABNORMAL HIGH (ref 70–99)
Glucose-Capillary: 156 mg/dL — ABNORMAL HIGH (ref 70–99)
Glucose-Capillary: 201 mg/dL — ABNORMAL HIGH (ref 70–99)
Glucose-Capillary: 128 mg/dL — ABNORMAL HIGH (ref 70–99)

## 2021-10-04 MED ORDER — FREE WATER
100.0000 mL | Freq: Four times a day (QID) | Status: DC
  Administered 2021-10-04 – 2021-10-05 (×5): 100 mL

## 2021-10-04 MED ORDER — CLONAZEPAM 1 MG PO TABS
1.0000 mg | ORAL_TABLET | Freq: Two times a day (BID) | ORAL | Status: DC
  Administered 2021-10-04 – 2021-10-12 (×18): 1 mg
  Filled 2021-10-04 (×18): qty 1

## 2021-10-04 MED ORDER — METHYLPREDNISOLONE SODIUM SUCC 125 MG IJ SOLR
40.0000 mg | Freq: Every day | INTRAMUSCULAR | Status: DC
Start: 2021-10-05 — End: 2021-10-09
  Administered 2021-10-05 – 2021-10-08 (×4): 40 mg via INTRAVENOUS
  Filled 2021-10-04 (×4): qty 2

## 2021-10-04 MED ORDER — ACETAZOLAMIDE 250 MG PO TABS
500.0000 mg | ORAL_TABLET | Freq: Once | ORAL | Status: AC
  Administered 2021-10-04: 500 mg
  Filled 2021-10-04: qty 2

## 2021-10-04 MED ORDER — SODIUM CHLORIDE 0.9 % IV SOLN
2.0000 g | Freq: Two times a day (BID) | INTRAVENOUS | Status: DC
  Administered 2021-10-04 – 2021-10-06 (×5): 2 g via INTRAVENOUS
  Filled 2021-10-04 (×5): qty 12.5

## 2021-10-04 NOTE — Progress Notes (Signed)
Respiratory culture collected, sent to lab.  

## 2021-10-04 NOTE — Progress Notes (Signed)
? ?NAME:  [name], MRN:  [id number], DOB:  05-Oct-1955, LOS: 9 ?ADMISSION DATE:  09/24/2021, CONSULTATION DATE:  4/18 ?REFERRING MD:  Tat/ triad, CHIEF COMPLAINT:  resp distress   ? ?History of Present Illness:  ?66 y.o. female quit smoking 2021 with  GOLD 3 COPD MZ, uncontrolled anxiety. Was on 02 one year PTA but "took herself off" per friend at bedside and able to care for pets at home but mostly housebound sinc ethen ? ?Patient presented to the ED with complaints of difficulty breathing, cough, wheezing x 1 week duration.  She also reported wheezing, and fatigue with O2 sats down to 78% on room air. ?She reported she was diagnosed with pneumonia one day PTA and given a shot of antibiotic continue on antibiotics and steroids. ?  ?EMS reported patient had rhonchi, O2 sats in the 80s on arrival to the ED.     ?  ?ED Course: Patient 98.8.  Heart rate 92-139.  Tachypneic with respiratory rate 33-37.  Blood pressure systolic 353G to 992E.  O2 sats 96 to 100% on 4 L.  O2 sats on room air not documented in ED.  Diffuse expiratory wheezing heard in ED. ?Patient was evaluated by RT in the ED and was placed on BiPAP. ?2 view chest x-ray without acute abnormality. ?IV Solu-Medrol, DuoNeb started.  Ativan 1 mg oral tablet given. ? ? ?Scheduled Meds: ? arformoterol  15 mcg Nebulization BID  ? budesonide (PULMICORT) nebulizer solution  0.5 mg Nebulization BID  ? chlorhexidine gluconate (MEDLINE KIT)  15 mL Mouth Rinse BID  ? Chlorhexidine Gluconate Cloth  6 each Topical Q0600  ? enoxaparin (LOVENOX) injection  40 mg Subcutaneous Q24H  ? feeding supplement (PROSource TF)  45 mL Per Tube Daily  ? furosemide  40 mg Intravenous BID  ? hydrALAZINE  25 mg Per Tube Q8H  ? insulin aspart  0-20 Units Subcutaneous Q4H  ? isosorbide dinitrate  20 mg Per Tube TID  ? mouth rinse  15 mL Mouth Rinse 10 times per day  ? methylPREDNISolone (SOLU-MEDROL) injection  40 mg Intravenous Q12H  ? montelukast  10 mg Per Tube q AM  ? pantoprazole  sodium  40 mg Per Tube Daily  ? QUEtiapine  100 mg Per Tube BID  ? revefenacin  175 mcg Nebulization Daily  ? ?Continuous Infusions: ? sodium chloride 10 mL/hr at 10/04/21 0100  ? cefTRIAXone (ROCEPHIN)  IV Stopped (10/03/21 1055)  ? dexmedetomidine (PRECEDEX) IV infusion 1.201 mcg/kg/hr (10/04/21 0539)  ? feeding supplement (OSMOLITE 1.2 CAL) 1,000 mL (10/04/21 0348)  ? fentaNYL infusion INTRAVENOUS 100 mcg/hr (10/04/21 0100)  ? ketamine (KETALAR) Adult IV Infusion 0.5 mg/kg/hr (10/04/21 0100)  ? ?PRN Meds:.sodium chloride, acetaminophen **OR** acetaminophen, docusate, fentaNYL, guaiFENesin-dextromethorphan, levalbuterol, polyethylene glycol, polyethylene glycol, senna  ? ? ?Significant Hospital Events: ?Including procedures, antibiotic start and stop dates in addition to other pertinent events   ?ET  4/18 c/b hypotension with severe air trapping  ?Echo 4/20 with EF 40%  ?4/24 CTA head negative for any significant abnormality ? ? ?Interim History / Subjective:  ? ?Respiratory discomfort through the night ? ?Objective   ?Blood pressure (!) 163/76, pulse (!) 119, temperature 98.9 ?F (37.2 ?C), temperature source Axillary, resp. rate (!) 31, height _0  (1.549 m), weight 56.1 kg, SpO2 91 %. ?   ?Vent Mode: PRVC ?FiO2 (%):  [40 %-60 %] 40 % ?Set Rate:  [15 bmp-28 bmp] 28 bmp ?Vt Set:  [380 mL-500 mL] 380 mL ?  PEEP:  [8 cmH20] 8 cmH20 ?Plateau Pressure:  [16 cmH20-23 cmH20] 22 cmH20  ? ?Intake/Output Summary (Last 24 hours) at 10/04/2021 0758 ?Last data filed at 10/04/2021 0631 ?Gross per 24 hour  ?Intake 2430.42 ml  ?Output 2900 ml  ?Net -469.58 ml  ? ?Filed Weights  ? 10/02/21 0500 10/03/21 0426 10/04/21 0500  ?Weight: 56.9 kg 56.1 kg 56.1 kg  ? ? ?Examination: ?General-chronically ill-appearing ?HEENT-dry oral mucosa, endotracheal tube in place ?neck -trachea is midline  ?respiratory -decreased air movement bilaterally, no wheezes, no rhonchi ?CV -S1-S2 appreciated ?GI -bowel sounds appreciated ?Neuro -sedated but  arousable ? ?Chest x-ray 4/26-slight increase in haziness at the base of the lung ?Most recent ABG noted-with respiratory acidosis ? ? ?I's and O's 9 L positive ?Diuresing well ? ?Assessment & Plan:  ? ?Acute hypoxemic/hypercapnic respiratory failure ?Gold stage III COPD ?-Target TVol 6-8cc/kgIBW ?-Target Plateau Pressure < 30cm H20 ?-Ventilator associated pneumonia prevention protocol ?-Not tolerating lower doses of sedation ?-Currently on Precedex, fentanyl, ketamine ?-Chest x-ray with increased markings ?-Switch antibiotics to cefepime ? ?Encephalopathy ?-Attempting to optimize medications ?-On ketamine ?-added Klonopin ? ?Advanced chronic obstructive pulmonary disease ?-Continue Brovana, Yupelri, Pulmicort ?-Decrease dose of solumedrol to daily ? ?Leukocytosis ?-Started on cefepime day 1/5 ?-Follow respiratory cultures ?-More haziness at the lung base ?-Did complete course of azithromycin ? ?Significant history of anxiety at baseline ? ?Transaminitis ?-LFT trend is improving ? ?Heart failure with reduced ejection fraction ?-We will continue to diurese ?-Added acetazolamide for contraction ? ?Thrombocytopenia ?-Platelets stable ? ?Very poor reserves at baseline ?-Discussing tracheostomy-tentatively for early next week ?-Sister will discuss this further with patient's spouse ? ? ?Best Practice (right click and "Reselect all SmartList Selections" daily)  ? ?On PPI ?DVT prophylaxis ? ?Labs   ?CBC: ?Recent Labs  ?Lab 09/28/21 ?0411 09/28/21 ?2054 09/30/21 ?4098 10/01/21 ?0109 10/01/21 ?1191 10/03/21 ?0155 10/03/21 ?0225 10/03/21 ?2323 10/04/21 ?0402 10/04/21 ?4782  ?WBC 10.5  --  13.2* 16.8*  --  24.3*  --   --   --  26.9*  ?NEUTROABS  --   --  11.9*  --   --  22.6*  --   --   --  24.5*  ?HGB 12.2   < > 11.7* 11.6*   < > 12.0 11.9* 12.2 12.6 11.8*  ?HCT 40.8   < > 36.3 35.8*   < > 38.2 35.0* 36.0 37.0 39.1  ?MCV 105.7*  --  101.7* 100.8*  --  102.1*  --   --   --  106.5*  ?PLT 113*  --  84* 89*  --  104*  --   --    --  117*  ? < > = values in this interval not displayed.  ? ? ?Basic Metabolic Panel: ?Recent Labs  ?Lab 09/27/21 ?1751 09/28/21 ?0411 09/28/21 ?2054 09/29/21 ?2130 09/30/21 ?9562 10/01/21 ?0109 10/01/21 ?1308 10/02/21 ?6578 10/02/21 ?2336 10/03/21 ?0155 10/03/21 ?0225 10/03/21 ?2323 10/04/21 ?0402 10/04/21 ?4696  ?NA  --  151*   < >  --    < > 141   < > 143 146* 146* 144 146* 146* 148*  ?K  --  5.0   < >  --    < > 5.0   < > 5.2* 5.0 4.8 4.6 4.3 4.2 4.3  ?CL  --  120*   < >  --    < > 99  --  99 100 100  --   --   --  102  ?  CO2  --  27   < >  --    < > 37*  --  37* 40* 40*  --   --   --  40*  ?GLUCOSE  --  221*   < >  --    < > 194*  --  247* 195* 196*  --   --   --  151*  ?BUN  --  44*   < >  --    < > 30*  --  36* 38* 36*  --   --   --  40*  ?CREATININE  --  0.84   < >  --    < > 0.56  --  0.62 0.68 0.64  --   --   --  0.66  ?CALCIUM  --  9.0   < >  --    < > 8.8*  --  8.9 8.7* 8.8*  --   --   --  9.0  ?MG  --  2.7*  --  2.3  --  2.4  --   --  2.7*  --   --   --   --  2.5*  ?PHOS 3.1 3.3  --   --   --   --   --   --   --   --   --   --   --   --   ? < > = values in this interval not displayed.  ? ?GFR: ?Estimated Creatinine Clearance: 54.3 mL/min (by C-G formula based on SCr of 0.66 mg/dL). ?Recent Labs  ?Lab 09/30/21 ?7510 10/01/21 ?0109 10/03/21 ?0155 10/04/21 ?2585  ?WBC 13.2* 16.8* 24.3* 26.9*  ? ? ?Liver Function Tests: ?Recent Labs  ?Lab 09/28/21 ?0411 09/29/21 ?0130 09/30/21 ?2778 10/03/21 ?0155  ?AST 504* 262* 126* 58*  ?ALT 1,123* 909* 739* 339*  ?ALKPHOS 77 75 94 135*  ?BILITOT 0.5 0.9 0.6 0.5  ?PROT 6.7 5.6* 5.7* 6.1*  ?ALBUMIN 3.5 3.0* 3.1* 3.0*  ? ?No results for input(s): LIPASE, AMYLASE in the last 168 hours. ?No results for input(s): AMMONIA in the last 168 hours. ? ?ABG ?   ?Component Value Date/Time  ? PHART 7.322 (L) 10/04/2021 0402  ? PCO2ART 82.0 (HH) 10/04/2021 0402  ? PO2ART 96 10/04/2021 0402  ? HCO3 42.3 (H) 10/04/2021 0402  ? TCO2 45 (H) 10/04/2021 0402  ? ACIDBASEDEF 0.2 09/27/2021 0855   ? O2SAT 96 10/04/2021 0402  ?  ? ?Coagulation Profile: ?No results for input(s): INR, PROTIME in the last 168 hours. ? ?Cardiac Enzymes: ?No results for input(s): CKTOTAL, CKMB, CKMBINDEX, TROPONINI in th

## 2021-10-04 NOTE — TOC Progression Note (Signed)
Transition of Care (TOC) - Progression Note  ? ? ?Patient Details  ?Name: [name] ?MRN: [id number] ?Date of Birth: 06/16/1955 ? ?Transition of Care (TOC) CM/SW Contact  ?Beckie Busing, RN ?Phone [phone] ? ?10/04/2021, 2:56 PM ? ?Clinical Narrative:    ?TOC continues to follow patient currently on vent, IV antibiotics & pressors. Currently no TOC needs but TOC continues to follow for anticipated disposition needs.  ? ? ?  ?Barriers to Discharge: Continued Medical Work up ? ?Expected Discharge Plan and Services ?  ?  ?  ?  ?  ?                ?  ?  ?  ?  ?  ?  ?  ?  ?  ?  ? ? ?Social Determinants of Health (SDOH) Interventions ?  ? ?Readmission Risk Interventions ?   ? View : No data to display.  ?  ?  ?  ? ? ?

## 2021-10-05 ENCOUNTER — Inpatient Hospital Stay (HOSPITAL_COMMUNITY): Payer: Medicare Other

## 2021-10-05 DIAGNOSIS — J9602 Acute respiratory failure with hypercapnia: Secondary | ICD-10-CM | POA: Diagnosis not present

## 2021-10-05 DIAGNOSIS — G9340 Encephalopathy, unspecified: Secondary | ICD-10-CM

## 2021-10-05 DIAGNOSIS — J441 Chronic obstructive pulmonary disease with (acute) exacerbation: Secondary | ICD-10-CM | POA: Diagnosis not present

## 2021-10-05 DIAGNOSIS — I5041 Acute combined systolic (congestive) and diastolic (congestive) heart failure: Secondary | ICD-10-CM | POA: Diagnosis not present

## 2021-10-05 DIAGNOSIS — G9341 Metabolic encephalopathy: Secondary | ICD-10-CM | POA: Insufficient documentation

## 2021-10-05 DIAGNOSIS — J9601 Acute respiratory failure with hypoxia: Secondary | ICD-10-CM | POA: Diagnosis not present

## 2021-10-05 LAB — POCT I-STAT 7, (LYTES, BLD GAS, ICA,H+H)
Acid-Base Excess: 13 mmol/L — ABNORMAL HIGH (ref 0.0–2.0)
Bicarbonate: 40.6 mmol/L — ABNORMAL HIGH (ref 20.0–28.0)
Calcium, Ion: 1.23 mmol/L (ref 1.15–1.40)
HCT: 37 % (ref 36.0–46.0)
Hemoglobin: 12.6 g/dL (ref 12.0–15.0)
O2 Saturation: 95 %
Patient temperature: 99.8
Potassium: 4 mmol/L (ref 3.5–5.1)
Sodium: 151 mmol/L — ABNORMAL HIGH (ref 135–145)
TCO2: 43 mmol/L — ABNORMAL HIGH (ref 22–32)
pCO2 arterial: 68.9 mmHg (ref 32–48)
pH, Arterial: 7.381 (ref 7.35–7.45)
pO2, Arterial: 81 mmHg — ABNORMAL LOW (ref 83–108)

## 2021-10-05 LAB — BASIC METABOLIC PANEL
Anion gap: 7 (ref 5–15)
BUN: 59 mg/dL — ABNORMAL HIGH (ref 8–23)
CO2: 36 mmol/L — ABNORMAL HIGH (ref 22–32)
Calcium: 8.7 mg/dL — ABNORMAL LOW (ref 8.9–10.3)
Chloride: 109 mmol/L (ref 98–111)
Creatinine, Ser: 0.92 mg/dL (ref 0.44–1.00)
GFR, Estimated: >60 mL/min (ref >60)
Glucose, Bld: 163 mg/dL — ABNORMAL HIGH (ref 70–99)
Potassium: 4.1 mmol/L (ref 3.5–5.1)
Sodium: 152 mmol/L — ABNORMAL HIGH (ref 135–145)

## 2021-10-05 LAB — GLUCOSE, CAPILLARY
Glucose-Capillary: 228 mg/dL — ABNORMAL HIGH (ref 70–99)
Glucose-Capillary: 168 mg/dL — ABNORMAL HIGH (ref 70–99)
Glucose-Capillary: 112 mg/dL — ABNORMAL HIGH (ref 70–99)
Glucose-Capillary: 213 mg/dL — ABNORMAL HIGH (ref 70–99)
Glucose-Capillary: 166 mg/dL — ABNORMAL HIGH (ref 70–99)
Glucose-Capillary: 157 mg/dL — ABNORMAL HIGH (ref 70–99)

## 2021-10-05 LAB — PHOSPHORUS: Phosphorus: 3.7 mg/dL (ref 2.5–4.6)

## 2021-10-05 MED ORDER — DEXTROSE 5 % IV SOLN: INTRAVENOUS | Status: DC

## 2021-10-05 MED ORDER — FUROSEMIDE 10 MG/ML IJ SOLN
40.0000 mg | Freq: Every day | INTRAMUSCULAR | Status: DC
  Administered 2021-10-06 – 2021-10-08 (×3): 40 mg via INTRAVENOUS
  Filled 2021-10-05 (×3): qty 4

## 2021-10-05 MED ORDER — METOPROLOL TARTRATE 12.5 MG HALF TABLET
12.5000 mg | ORAL_TABLET | Freq: Two times a day (BID) | ORAL | Status: DC
  Administered 2021-10-05 – 2021-10-06 (×3): 12.5 mg
  Filled 2021-10-05 (×3): qty 1

## 2021-10-05 MED ORDER — FREE WATER
200.0000 mL | Status: DC
  Administered 2021-10-05 – 2021-10-08 (×17): 200 mL

## 2021-10-05 NOTE — Progress Notes (Signed)
? ?Progress Note ? ?Patient Name: [name] ?Date of Encounter: 10/05/2021 ? ?[facility] Cardiologist: Dorris Carnes, MD  ? ?Subjective  ? ?Intubated and sedated ? ?Inpatient Medications  ?  ?Scheduled Meds: ? arformoterol  15 mcg Nebulization BID  ? budesonide (PULMICORT) nebulizer solution  0.5 mg Nebulization BID  ? chlorhexidine gluconate (MEDLINE KIT)  15 mL Mouth Rinse BID  ? Chlorhexidine Gluconate Cloth  6 each Topical Q0600  ? clonazePAM  1 mg Per Tube BID  ? enoxaparin (LOVENOX) injection  40 mg Subcutaneous Q24H  ? feeding supplement (PROSource TF)  45 mL Per Tube Daily  ? free water  200 mL Per Tube Q4H  ? [START ON 10/06/2021] furosemide  40 mg Intravenous Daily  ? hydrALAZINE  25 mg Per Tube Q8H  ? insulin aspart  0-20 Units Subcutaneous Q4H  ? isosorbide dinitrate  20 mg Per Tube TID  ? mouth rinse  15 mL Mouth Rinse 10 times per day  ? methylPREDNISolone (SOLU-MEDROL) injection  40 mg Intravenous Daily  ? metoprolol tartrate  12.5 mg Per Tube BID  ? montelukast  10 mg Per Tube q AM  ? pantoprazole sodium  40 mg Per Tube Daily  ? QUEtiapine  100 mg Per Tube BID  ? revefenacin  175 mcg Nebulization Daily  ? ?Continuous Infusions: ? sodium chloride 10 mL/hr at 10/05/21 5277  ? ceFEPime (MAXIPIME) IV 2 g (10/05/21 0807)  ? dexmedetomidine (PRECEDEX) IV infusion 0.8 mcg/kg/hr (10/05/21 0742)  ? dextrose    ? feeding supplement (OSMOLITE 1.2 CAL) 1,000 mL (10/04/21 0348)  ? ketamine (KETALAR) Adult IV Infusion 0.5 mg/kg/hr (10/05/21 8242)  ? ?PRN Meds: ?sodium chloride, acetaminophen **OR** acetaminophen, docusate, fentaNYL, guaiFENesin-dextromethorphan, levalbuterol, polyethylene glycol, polyethylene glycol, senna  ? ?Vital Signs  ?  ?Vitals:  ? 10/05/21 0900 10/05/21 1000 10/05/21 1100 10/05/21 1114  ?BP: (!) 80/46 (!) 102/59 110/61   ?Pulse: (!) 110 (!) 103 97   ?Resp: (!) 29 (!) 26 (!) 26   ?Temp:    98.2 ?F (36.8 ?C)  ?TempSrc:    Oral  ?SpO2: 93% 96% 95%   ?Weight:      ?Height:       ? ? ?Intake/Output Summary (Last 24 hours) at 10/05/2021 1126 ?Last data filed at 10/05/2021 [phone] ?Gross per 24 hour  ?Intake 2183.68 ml  ?Output 1650 ml  ?Net 533.68 ml  ? ? ?  10/05/2021  ?  5:00 AM 10/04/2021  ?  5:00 AM 10/03/2021  ?  4:26 AM  ?Last 3 Weights  ?Weight (lbs) 117 lb 11.6 oz 123 lb 10.9 oz 123 lb 10.9 oz  ?Weight (kg) 53.4 kg 56.1 kg 56.1 kg  ?   ? ?Telemetry  ?  ?Sinus tach - Personally Reviewed ? ?ECG  ?  ?No new since 4/17 - Personally Reviewed ? ?Physical Exam  ? ?GEN: intubated and sedated ?NECK: No JVD ?CARDIAC: tachycardic regular rhythm, normal S1 and S2, no rubs or gallops. No murmur. ?VASCULAR: Radial pulses 2+ bilaterally.  ?RESPIRATORY:  intubated, decreased breath sounds ?ABDOMEN: Soft, non-tender, non-distended ?MUSCULOSKELETAL:  Moves all 4 limbs independently ?SKIN: Warm and dry, no edema ?NEUROLOGIC:  sedated ?PSYCHIATRIC:  sedated ? ?Labs  ?  ?High Sensitivity Troponin:   ?Recent Labs  ?Lab 09/24/21 ?1707 09/24/21 ?1931  ?TROPONINIHS 7 12  ?   ?Chemistry ?Recent Labs  ?Lab 09/29/21 ?0130 09/29/21 ?2130 09/30/21 ?1443 10/01/21 ?0109 10/01/21 ?0912 10/02/21 ?2336 10/03/21 ?0155 10/03/21 ?0225 10/04/21 ?1540 10/04/21 ?  1646 10/05/21 ?0450 10/05/21 ?9381  ?NA 145  --  141 141   < > 146* 146*   < > 148* 149* 151* 152*  ?K 4.2  --  4.9 5.0   < > 5.0 4.8   < > 4.3 4.3 4.0 4.1  ?CL 109  --  104 99   < > 100 100  --  102  --   --  109  ?CO2 31  --  28 37*   < > 40* 40*  --  40*  --   --  36*  ?GLUCOSE 160*  --  275* 194*   < > 195* 196*  --  151*  --   --  163*  ?BUN 28*  --  30* 30*   < > 38* 36*  --  40*  --   --  59*  ?CREATININE 0.67  --  0.61 0.56   < > 0.68 0.64  --  0.66  --   --  0.92  ?CALCIUM 8.8*  --  8.9 8.8*   < > 8.7* 8.8*  --  9.0  --   --  8.7*  ?MG  --    < >  --  2.4  --  2.7*  --   --  2.5*  --   --   --   ?PROT 5.6*  --  5.7*  --   --   --  6.1*  --   --   --   --   --   ?ALBUMIN 3.0*  --  3.1*  --   --   --  3.0*  --   --   --   --   --   ?AST 262*  --  126*  --   --   --   58*  --   --   --   --   --   ?ALT 909*  --  739*  --   --   --  339*  --   --   --   --   --   ?ALKPHOS 75  --  94  --   --   --  135*  --   --   --   --   --   ?BILITOT 0.9  --  0.6  --   --   --  0.5  --   --   --   --   --   ?GFRNONAA >60  --  >60 >60   < > >60 >60  --  >60  --   --  >60  ?ANIONGAP 5  --  9 5   < > 6 6  --  6  --   --  7  ? < > = values in this interval not displayed.  ?  ?Lipids No results for input(s): CHOL, TRIG, HDL, LABVLDL, LDLCALC, CHOLHDL in the last 168 hours.  ?Hematology ?Recent Labs  ?Lab 10/01/21 ?0109 10/01/21 ?0912 10/03/21 ?0155 10/03/21 ?0225 10/04/21 ?8299 10/04/21 ?1646 10/05/21 ?0450  ?WBC 16.8*  --  24.3*  --  26.9*  --   --   ?RBC 3.55*  --  3.74*  --  3.67*  --   --   ?HGB 11.6*   < > 12.0   < > 11.8* 12.2 12.6  ?HCT 35.8*   < > 38.2   < > 39.1 36.0 37.0  ?MCV 100.8*  --  102.1*  --  106.5*  --   --   ?  MCH 32.7  --  32.1  --  32.2  --   --   ?MCHC 32.4  --  31.4  --  30.2  --   --   ?RDW 13.2  --  13.6  --  14.0  --   --   ?PLT 89*  --  104*  --  117*  --   --   ? < > = values in this interval not displayed.  ? ?Thyroid No results for input(s): TSH, FREET4 in the last 168 hours.  ?BNP ?No results for input(s): BNP, PROBNP in the last 168 hours. ?  ?DDimer No results for input(s): DDIMER in the last 168 hours.  ? ?Radiology  ?  ?DG Abd Portable 1V ? ?Result Date: 10/05/2021 ?CLINICAL DATA:  Feeding tube placement. EXAM: PORTABLE ABDOMEN - 1 VIEW COMPARISON:  None. FINDINGS: Enteric tube appears adequately positioned in the stomach with tip directed towards the pylorus/duodenal bulb. Coarse lung markings are seen at each lung base, suggesting chronic interstitial lung disease/emphysema. IMPRESSION: Enteric tube adequately positioned in the stomach with tip directed towards the pylorus/duodenal bulb. Electronically Signed   By: Franki Cabot M.D.   On: 10/05/2021 10:04   ? ?Cardiac Studies  ? ?Echo:  4/202/23 ?  ?1. Limited visualization of the myocardium, incomplete  assessment of wall  ?motion. Anteroseptal wall is akinetic. Apex is akinetic. Marland Kitchen Left  ?ventricular ejection fraction, by estimation, is 40%. The left ventricle  ?has mildly decreased function. The left  ?ventricle demonstrates regional wall motion abnormalities (see scoring  ?diagram/findings for description). Left ventricular diastolic parameters  ?are consistent with Grade I diastolic dysfunction (impaired relaxation).  ? 2. Right ventricular systolic function is normal. The right ventricular  ?size is normal. There is normal pulmonary artery systolic pressure.  ? 3. The mitral valve is normal in structure. No evidence of mitral valve  ?regurgitation. No evidence of mitral stenosis.  ? 4. The tricuspid valve is abnormal.  ? 5. The aortic valve is tricuspid. Aortic valve regurgitation is not  ?visualized. No aortic stenosis is present.  ? 6. The inferior vena cava is normal in size with greater than 50%  ?respiratory variability, suggesting right atrial pressure of 3 mmHg.  ? ?Patient Profile  ?   ?67 y.o. 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 Carles Collet. ? ?Assessment & Plan  ?  ?Cardiomyopathy ?Acute systolic and diastolic heart failure ?Hypertension ?-EF 40% with focal wall motion abnormalities. Concern for ischemic etiology ?-elevated BNP ?-on isordil 20 mg TID, hydralazine  25 mg q8 hours. Was not on home BP medication prior to admission. Fluctuation in BP likely related to agitation Potassium now improved, was holding around 5, but BP range 81/51-181/79. Would continue with shorter acting medications that can be held if needed. Once BP more stabilized, would add low dose ARB and monitor potassium closely. ?-will start low dose metoprolol for low EF ?-will need ischemic workup at some point; with prolonged ICU course, would consider either just prior to discharge when more stable or as an  outpatient ?-platelets have been low, holding aspirin, start when platelets consistently normalized and no evidence of bleeding ? ?Acute hypoxic respiratory failure ?COPD exacerbation ?-management per PCCM ? ?Hyperlipidemia ?Transaminitis ?

## 2021-10-05 NOTE — Procedures (Signed)
Cortrak  Tube Type:  Cortrak - 43 inches Tube Location:  Left nare Initial Placement:  Stomach Secured by: Bridle Technique Used to Measure Tube Placement:  Marking at nare/corner of mouth Cortrak Secured At:  70 cm  Cortrak Tube Team Note:  Consult received to place a Cortrak feeding tube.   X-ray is required, abdominal x-ray has been ordered by the Cortrak team. Please confirm tube placement before using the Cortrak tube.   If the tube becomes dislodged please keep the tube and contact the Cortrak team at [email or web address] (password [email or web address]) for replacement.  If after hours and replacement cannot be delayed, place a NG tube and confirm placement with an abdominal x-ray.    Earlie Arciga MS, RD, LDN Please refer to AMION for RD and/or RD on-call/weekend/after hours pager   

## 2021-10-05 NOTE — Progress Notes (Signed)
? ?NAME:  [name], MRN:  [id number], DOB:  1955/07/08, LOS: 10 ?ADMISSION DATE:  09/24/2021, CONSULTATION DATE:  4/18 ?REFERRING MD:  Tat/ triad, CHIEF COMPLAINT:  resp distress   ? ?History of Present Illness:  ?66 y.o. female quit smoking 2021 with  GOLD 3 COPD MZ, uncontrolled anxiety. Was on 02 one year PTA but "took herself off" per friend at bedside and able to care for pets at home but mostly housebound sinc ethen ? ?She was admitted 4/17 to [facility], ICU with acute hypoxic/hypercarbic respiratory failure, failed BiPAP and required mechanical ventilation, transferred to [facility] 4/21 due to new finding of LV dysfunction ? ? ? ?Significant Hospital Events: ?Including procedures, antibiotic start and stop dates in addition to other pertinent events   ?ET  4/18 c/b hypotension with severe air trapping  ?Echo 4/20 with EF 40% , akinesis of apex and septal wall ?4/24 CTA head negative for any significant abnormality ?4/25 ketamine added ? ? ?Interim History / Subjective:  ? ?She is critically ill, intubated, low-grade febrile 200. ?On Precedex, ketamine ?Fentanyl being weaned off ? ?Objective   ?Blood pressure (!) 101/59, pulse 97, temperature 98.2 ?F (36.8 ?C), temperature source Oral, resp. rate (!) 25, height 5\' 1"  (1.549 m), weight 53.4 kg, SpO2 95 %. ?   ?Vent Mode: PRVC ?FiO2 (%):  [50 %-60 %] 50 % ?Set Rate:  [20 bmp-28 bmp] 20 bmp ?Vt Set:  [430 mL] 430 mL ?PEEP:  [5 cmH20-8 cmH20] 5 cmH20 ?Plateau Pressure:  [16 cmH20-21 cmH20] 16 cmH20  ? ?Intake/Output Summary (Last 24 hours) at 10/05/2021 1219 ?Last data filed at 10/05/2021 [phone] ?Gross per 24 hour  ?Intake 2055.42 ml  ?Output 1650 ml  ?Net 405.42 ml  ? ? ?Filed Weights  ? 10/03/21 0426 10/04/21 0500 10/05/21 0500  ?Weight: 56.1 kg 56.1 kg 53.4 kg  ? ? ?Examination: ?General-chronically ill-appearing , appears mild distress on vent ?HEENT-dry oral mucosa, endotracheal tube in place ?neck -trachea is midline , no JVD ?respiratory -mild accessory muscle  use, decreased breath sounds bilateral, shallow breaths, triggers vent ?CV -S1-S2 tacky ?GI -bowel sounds appreciated ?Neuro -sedate, RASS -2 ? ?Chest x-ray 4/26-mild interstitial opacities both bases ?Labs show hyponatremia, high BUN normal creatinine. ?ABG shows compensated hypercarbia ? ? ?Assessment & Plan:  ? ?Acute hypoxemic/hypercapnic respiratory failure ? ?-Target TVol 6-8cc/kgIBW ?-Target Plateau Pressure < 30cm H20 ?-Ventilator associated pneumonia prevention protocol ?-Lower PEEP to 5, decreased trigger to -1 , avoid auto PEEP ? ? ?HAP -bibasilar pneumonia , completed azithromycin course ?-Continue cefepime, can stop after 5 days if respiratory culture negative ? ? ?Acute metabolic encephalopathy ?Severe anxiety ?-Attempting to optimize medications ?--Currently on Precedex, ketamine , use fentanyl intermittently only ?-Goal RASS 0 to -1 ?-On Seroquel 100 twice daily ?-added Klonopin ? ?Acute exacerbation of COPD ?Gold stage 4 COPD -FEV1 baseline 35% ?-Continue Brovana, Yupelri, Pulmicort ?-Decrease dose of solumedrol to daily ? ?Acute systolic heart failure ?-New drop in EF of 40% with septal apical akinesis concerning for ischemia ?-Isordil and hydralazine per cardiology ?-Low-dose metoprolol ordered , okay since no bronchospasm ?-We will need ischemic work-up at some point ?-Aspirin on hold due to low platelets ? ?Hypernatremia -increase free water 200 every 4 and add D5W at 50 cc/h ? ?Thrombocytopenia ?-Platelets stable ? ?Severe deconditioning ?-Discussing tracheostomy-tentatively for early next week ?-Sister will discuss this further with patient's spouse ? ? ?Best Practice (right click and "Reselect all SmartList Selections" daily)  ? ?On PPI ?DVT prophylaxis ? ?  Labs   ?CBC: ?Recent Labs  ?Lab 09/30/21 ?[id number] 10/01/21 ?0109 10/01/21 ?[id number] 10/03/21 ?0155 10/03/21 ?0225 10/03/21 ?2323 10/04/21 ?0402 10/04/21 ?[id number] 10/04/21 ?1646 10/05/21 ?0450  ?WBC 13.2* 16.8*  --  24.3*  --   --   --  26.9*  --   --    ?NEUTROABS 11.9*  --   --  22.6*  --   --   --  24.5*  --   --   ?HGB 11.7* 11.6*   < > 12.0   < > 12.2 12.6 11.8* 12.2 12.6  ?HCT 36.3 35.8*   < > 38.2   < > 36.0 37.0 39.1 36.0 37.0  ?MCV 101.7* 100.8*  --  102.1*  --   --   --  106.5*  --   --   ?PLT 84* 89*  --  104*  --   --   --  117*  --   --   ? < > = values in this interval not displayed.  ? ? ? ?Basic Metabolic Panel: ?Recent Labs  ?Lab 09/29/21 ?2130 09/30/21 ?[id number] 10/01/21 ?0109 10/01/21 ?[id number] 10/02/21 ?[id number] 10/02/21 ?2336 10/03/21 ?0155 10/03/21 ?0225 10/04/21 ?0402 10/04/21 ?[id number] 10/04/21 ?1646 10/05/21 ?0450 10/05/21 ?[id number]  ?NA  --    < > 141   < > 143 146* 146*   < > 146* 148* 149* 151* 152*  ?K  --    < > 5.0   < > 5.2* 5.0 4.8   < > 4.2 4.3 4.3 4.0 4.1  ?CL  --    < > 99  --  99 100 100  --   --  102  --   --  109  ?CO2  --    < > 37*  --  37* 40* 40*  --   --  40*  --   --  36*  ?GLUCOSE  --    < > 194*  --  247* 195* 196*  --   --  151*  --   --  163*  ?BUN  --    < > 30*  --  36* 38* 36*  --   --  40*  --   --  59*  ?CREATININE  --    < > 0.56  --  0.62 0.68 0.64  --   --  0.66  --   --  0.92  ?CALCIUM  --    < > 8.8*  --  8.9 8.7* 8.8*  --   --  9.0  --   --  8.7*  ?MG 2.3  --  2.4  --   --  2.7*  --   --   --  2.5*  --   --   --   ?PHOS  --   --   --   --   --   --   --   --   --   --   --   --  3.7  ? < > = values in this interval not displayed.  ? ? ?GFR: ?Estimated Creatinine Clearance: 47.2 mL/min (by C-G formula based on SCr of 0.92 mg/dL). ?Recent Labs  ?Lab 09/30/21 ?[id number] 10/01/21 ?0109 10/03/21 ?0155 10/04/21 ?[id number]  ?WBC 13.2* 16.8* 24.3* 26.9*  ? ? ? ?Liver Function Tests: ?Recent Labs  ?Lab 09/29/21 ?0130 09/30/21 ?[id number] 10/03/21 ?0155  ?AST 262* 126* 58*  ?ALT 909* 739* 339*  ?ALKPHOS 75 94 135*  ?BILITOT 0.9 0.6 0.5  ?  PROT 5.6* 5.7* 6.1*  ?ALBUMIN 3.0* 3.1* 3.0*  ? ? ?No results for input(s): LIPASE, AMYLASE in the last 168 hours. ?No results for input(s): AMMONIA in the last 168 hours. ? ?ABG ?   ?Component Value Date/Time  ?  PHART 7.381 10/05/2021 0450  ? PCO2ART 68.9 ([place]) 10/05/2021 0450  ? PO2ART 81 (L) 10/05/2021 0450  ? HCO3 40.6 (H) 10/05/2021 0450  ? TCO2 43 (H) 10/05/2021 0450  ? ACIDBASEDEF 0.2 09/27/2021 0855  ? O2SAT 95 10/05/2021 0450  ? ?  ? ?Coagulation Profile: ?No results for input(s): INR, PROTIME in the last 168 hours. ? ?Cardiac Enzymes: ?No results for input(s): CKTOTAL, CKMB, CKMBINDEX, TROPONINI in the last 168 hours. ? ?HbA1C: ?Hgb A1c MFr Bld  ?Date/Time Value Ref Range Status  ?09/25/2021 08:08 AM 5.4 4.8 - 5.6 % Final  ?  Comment:  ?  (NOTE) ?Pre diabetes:          5.7%-6.4% ? ?Diabetes:              >6.4% ? ?Glycemic control for   <7.0% ?adults with diabetes ?  ? ? ?CBG: ?Recent Labs  ?Lab 10/04/21 ?1948 10/04/21 ?2320 10/05/21 ?0326 10/05/21 ?[id number] 10/05/21 ?1113  ?GLUCAP 201* 128* 228* 168* 112*  ? ? ?The patient is critically ill with multiple organ systems failure and requires high complexity decision making for assessment and support, frequent evaluation and titration of therapies, application of advanced monitoring technologies and extensive interpretation of multiple databases. Critical Care Time devoted to patient care services described in this note independent of APP/resident time (if applicable)  [id number] minutes.  ? ?Kara Mead MD. Shade Flood. ?[facility] ?Pager : [phone] ? ?If no response to pager , please call [phone] until 7 pm ?After 7:00 pm call Elink  [phone]  ? ?10/05/2021 ? ?

## 2021-10-06 DIAGNOSIS — J9602 Acute respiratory failure with hypercapnia: Secondary | ICD-10-CM | POA: Diagnosis not present

## 2021-10-06 DIAGNOSIS — R000 Tachycardia, unspecified: Secondary | ICD-10-CM

## 2021-10-06 DIAGNOSIS — J9601 Acute respiratory failure with hypoxia: Secondary | ICD-10-CM | POA: Diagnosis not present

## 2021-10-06 DIAGNOSIS — R7401 Elevation of levels of liver transaminase levels: Secondary | ICD-10-CM

## 2021-10-06 DIAGNOSIS — E785 Hyperlipidemia, unspecified: Secondary | ICD-10-CM

## 2021-10-06 DIAGNOSIS — I5041 Acute combined systolic (congestive) and diastolic (congestive) heart failure: Secondary | ICD-10-CM | POA: Diagnosis not present

## 2021-10-06 DIAGNOSIS — I10 Essential (primary) hypertension: Secondary | ICD-10-CM | POA: Diagnosis not present

## 2021-10-06 DIAGNOSIS — I429 Cardiomyopathy, unspecified: Secondary | ICD-10-CM | POA: Diagnosis not present

## 2021-10-06 DIAGNOSIS — E7800 Pure hypercholesterolemia, unspecified: Secondary | ICD-10-CM

## 2021-10-06 DIAGNOSIS — I5022 Chronic systolic (congestive) heart failure: Secondary | ICD-10-CM | POA: Diagnosis not present

## 2021-10-06 DIAGNOSIS — I5021 Acute systolic (congestive) heart failure: Secondary | ICD-10-CM | POA: Diagnosis not present

## 2021-10-06 DIAGNOSIS — G9340 Encephalopathy, unspecified: Secondary | ICD-10-CM | POA: Diagnosis not present

## 2021-10-06 DIAGNOSIS — J441 Chronic obstructive pulmonary disease with (acute) exacerbation: Secondary | ICD-10-CM | POA: Diagnosis not present

## 2021-10-06 LAB — BASIC METABOLIC PANEL
Anion gap: 5 (ref 5–15)
BUN: 50 mg/dL — ABNORMAL HIGH (ref 8–23)
CO2: 35 mmol/L — ABNORMAL HIGH (ref 22–32)
Calcium: 8.7 mg/dL — ABNORMAL LOW (ref 8.9–10.3)
Chloride: 110 mmol/L (ref 98–111)
Creatinine, Ser: 0.65 mg/dL (ref 0.44–1.00)
GFR, Estimated: >60 mL/min (ref >60)
Glucose, Bld: 168 mg/dL — ABNORMAL HIGH (ref 70–99)
Potassium: 4.3 mmol/L (ref 3.5–5.1)
Sodium: 150 mmol/L — ABNORMAL HIGH (ref 135–145)

## 2021-10-06 LAB — CBC WITH DIFFERENTIAL/PLATELET
Abs Immature Granulocytes: 0.22 10*3/uL — ABNORMAL HIGH (ref 0.00–0.07)
Basophils Absolute: 0 10*3/uL (ref 0.0–0.1)
Basophils Relative: 0 %
Eosinophils Absolute: 0 10*3/uL (ref 0.0–0.5)
Eosinophils Relative: 0 %
HCT: 36.5 % (ref 36.0–46.0)
Hemoglobin: 11.1 g/dL — ABNORMAL LOW (ref 12.0–15.0)
Immature Granulocytes: 1 %
Lymphocytes Relative: 4 %
Lymphs Abs: 0.9 10*3/uL (ref 0.7–4.0)
MCH: 32 pg (ref 26.0–34.0)
MCHC: 30.4 g/dL (ref 30.0–36.0)
MCV: 105.2 fL — ABNORMAL HIGH (ref 80.0–100.0)
Monocytes Absolute: 1.1 10*3/uL — ABNORMAL HIGH (ref 0.1–1.0)
Monocytes Relative: 5 %
Neutro Abs: 19.1 10*3/uL — ABNORMAL HIGH (ref 1.7–7.7)
Neutrophils Relative %: 90 %
Platelets: 101 10*3/uL — ABNORMAL LOW (ref 150–400)
RBC: 3.47 MIL/uL — ABNORMAL LOW (ref 3.87–5.11)
RDW: 14.6 % (ref 11.5–15.5)
WBC: 21.3 10*3/uL — ABNORMAL HIGH (ref 4.0–10.5)
nRBC: 0 % (ref 0.0–0.2)

## 2021-10-06 LAB — HEPATIC FUNCTION PANEL
ALT: 182 U/L — ABNORMAL HIGH (ref 0–44)
AST: 48 U/L — ABNORMAL HIGH (ref 15–41)
Albumin: 2.6 g/dL — ABNORMAL LOW (ref 3.5–5.0)
Alkaline Phosphatase: 111 U/L (ref 38–126)
Bilirubin, Direct: 0.2 mg/dL (ref 0.0–0.2)
Indirect Bilirubin: 0.5 mg/dL (ref 0.3–0.9)
Total Bilirubin: 0.7 mg/dL (ref 0.3–1.2)
Total Protein: 5.7 g/dL — ABNORMAL LOW (ref 6.5–8.1)

## 2021-10-06 LAB — CULTURE, RESPIRATORY W GRAM STAIN: Gram Stain: NONE SEEN

## 2021-10-06 LAB — PHOSPHORUS: Phosphorus: 2.4 mg/dL — ABNORMAL LOW (ref 2.5–4.6)

## 2021-10-06 LAB — GLUCOSE, CAPILLARY
Glucose-Capillary: 146 mg/dL — ABNORMAL HIGH (ref 70–99)
Glucose-Capillary: 165 mg/dL — ABNORMAL HIGH (ref 70–99)
Glucose-Capillary: 167 mg/dL — ABNORMAL HIGH (ref 70–99)
Glucose-Capillary: 168 mg/dL — ABNORMAL HIGH (ref 70–99)
Glucose-Capillary: 210 mg/dL — ABNORMAL HIGH (ref 70–99)
Glucose-Capillary: 286 mg/dL — ABNORMAL HIGH (ref 70–99)

## 2021-10-06 LAB — MAGNESIUM: Magnesium: 2.5 mg/dL — ABNORMAL HIGH (ref 1.7–2.4)

## 2021-10-06 MED ORDER — POTASSIUM & SODIUM PHOSPHATES 280-160-250 MG PO PACK
2.0000 | PACK | Freq: Three times a day (TID) | ORAL | Status: AC
  Administered 2021-10-06 (×3): 2
  Filled 2021-10-06 (×3): qty 2

## 2021-10-06 MED ORDER — METOPROLOL TARTRATE 25 MG PO TABS
25.0000 mg | ORAL_TABLET | Freq: Two times a day (BID) | ORAL | Status: DC
  Administered 2021-10-06 – 2021-10-07 (×2): 25 mg
  Filled 2021-10-06 (×2): qty 1

## 2021-10-06 MED ORDER — WHITE PETROLATUM EX OINT
TOPICAL_OINTMENT | CUTANEOUS | Status: DC | PRN
  Filled 2021-10-06 (×2): qty 28.35

## 2021-10-06 MED ORDER — METOPROLOL TARTRATE 12.5 MG HALF TABLET
12.5000 mg | ORAL_TABLET | Freq: Once | ORAL | Status: AC
  Administered 2021-10-06: 12.5 mg
  Filled 2021-10-06: qty 1

## 2021-10-06 MED ORDER — SODIUM CHLORIDE 0.9 % IV SOLN
2.0000 g | INTRAVENOUS | Status: AC
  Administered 2021-10-06 – 2021-10-08 (×3): 2 g via INTRAVENOUS
  Filled 2021-10-06 (×3): qty 20

## 2021-10-06 NOTE — Progress Notes (Addendum)
? ?NAME:  [name], MRN:  [id number], DOB:  06-14-1955, LOS: 80 ?ADMISSION DATE:  09/24/2021, CONSULTATION DATE:  4/18 ?REFERRING MD:  Tat/ triad, CHIEF COMPLAINT:  resp distress   ? ?History of Present Illness:  ?66 y.o. female quit smoking 2021 with  GOLD 3 COPD MZ, uncontrolled anxiety. Was on 02 one year PTA but "took herself off" per friend at bedside and able to care for pets at home but mostly housebound sinc ethen ? ?She was admitted 4/17 to [facility], ICU with acute hypoxic/hypercarbic respiratory failure, failed BiPAP and required mechanical ventilation, transferred to [facility] 4/21 due to new finding of LV dysfunction ? ? ? ?Significant Hospital Events: ?Including procedures, antibiotic start and stop dates in addition to other pertinent events   ?ET  4/18 c/b hypotension with severe air trapping  ?Echo 4/20 with EF 40% , akinesis of apex and septal wall ?4/24 CTA head negative for any significant abnormality ?4/25 ketamine added ?4/28 increased work of breathing during the wean, severe auto PEEP ? ?Interim History / Subjective:  ? ?Critically ill, remains intubated ?Sedated on Precedex and ketamine ?Febrile 1012 ?Good urine output, 12 L positive ? ?Objective   ?Blood pressure 128/65, pulse 81, temperature 97.8 ?F (36.6 ?C), temperature source Oral, resp. rate (!) 28, height 5\' 1"  (1.549 m), weight 52.5 kg, SpO2 97 %. ?   ?Vent Mode: PRVC ?FiO2 (%):  [40 %-50 %] 40 % ?Set Rate:  [20 bmp] 20 bmp ?Vt Set:  [430 mL] 430 mL ?PEEP:  [5 cmH20] 5 cmH20 ?Pressure Support:  [10 cmH20] 10 cmH20 ?Plateau Pressure:  [16 cmH20-21 cmH20] 21 cmH20  ? ?Intake/Output Summary (Last 24 hours) at 10/06/2021 0945 ?Last data filed at 10/06/2021 0900 ?Gross per 24 hour  ?Intake 4457.95 ml  ?Output 1550 ml  ?Net 2907.95 ml  ? ? ?Filed Weights  ? 10/04/21 0500 10/05/21 0500 10/06/21 0500  ?Weight: 56.1 kg 53.4 kg 52.5 kg  ? ? ?Examination: ?General-chronically ill-appearing , thin and frail, appears mild distress on  vent ?HEENT-dry oral mucosa, endotracheal tube in place ?neck -trachea is midline , no JVD ?respiratory -decreased breath sounds bilateral, mild accessory muscle use, no rhonchi ?CV -S1-S2 tacky ?GI -bowel sounds appreciated ?Neuro -sedate, RASS -2, does not follow commands ? ?X-ray abdomen 4/28 shows mild persistent interstitial opacities at bases ?Labs show hyponatremia, mild hypophosphatemia, decreasing leukocytosis ?ABG shows compensated hypercarbia ? ? ?Assessment & Plan:  ? ?Acute hypoxemic/hypercapnic respiratory failure ? ?-Target TVol 6-8cc/kgIBW ?-Target Plateau Pressure < 30cm H20 ?-Ventilator associated pneumonia prevention protocol ?-Severe auto PEEP, persists in spite of lowering vent RR ? ? ?HAP -bibasilar pneumonia , completed azithromycin course ?-Respiratory culture 4/27 showing Klebsiella ?-Continue cefepime, simplify based on culture results ? ? ?Acute metabolic encephalopathy ?Severe anxiety ?-Currently on Precedex, ketamine , use fentanyl intermittently only ?-Goal RASS 0 to -1 ?-On Seroquel 100 twice daily ?-Continue Klonopin ? ?Acute exacerbation of COPD ?Gold stage 4 COPD -FEV1 baseline 35% ?-Continue Brovana, Yupelri, Pulmicort ?-Decrease dose of solumedrol to daily ? ?Acute systolic heart failure ?-New drop in EF of 40% with septal apical akinesis concerning for ischemia ?-Isordil and hydralazine per cardiology ?-Low-dose metoprolol ordered , okay since no bronchospasm ?-will need ischemic work-up at some point ?-Aspirin on hold due to low platelets ?-lasix 40 daily ? ?Hypernatremia -increased free water 200 every 4  ?-added D5W at 50 cc/h ?-Check bmet daily ? ?Thrombocytopenia ?-Platelets low but stable ? ?Goals of care ?-Discussed tracheostomy in detail with  sister 4/29-tentatively for early next week ?-Sister will update patient's spouse -he is struggling after recent hospital admission and new diagnosis of lung cancer ?-Sister understands that she will likely need facility care and  high chance of vent dependence ? ? ?Best Practice (right click and "Reselect all SmartList Selections" daily)  ? ?PPI ?Enoxaparin ? ?Labs   ?CBC: ?Recent Labs  ?Lab 09/30/21 ?[id number] 10/01/21 ?0109 10/01/21 ?0912 10/03/21 ?0155 10/03/21 ?0225 10/04/21 ?0402 10/04/21 ?[id number] 10/04/21 ?1646 10/05/21 ?0450 10/06/21 ?0327  ?WBC 13.2* 16.8*  --  24.3*  --   --  26.9*  --   --  21.3*  ?NEUTROABS 11.9*  --   --  22.6*  --   --  24.5*  --   --  19.1*  ?HGB 11.7* 11.6*   < > 12.0   < > 12.6 11.8* 12.2 12.6 11.1*  ?HCT 36.3 35.8*   < > 38.2   < > 37.0 39.1 36.0 37.0 36.5  ?MCV 101.7* 100.8*  --  102.1*  --   --  106.5*  --   --  105.2*  ?PLT 84* 89*  --  104*  --   --  117*  --   --  101*  ? < > = values in this interval not displayed.  ? ? ? ?Basic Metabolic Panel: ?Recent Labs  ?Lab 09/29/21 ?2130 09/30/21 ?[id number] 10/01/21 ?0109 10/01/21 ?0912 10/02/21 ?2336 10/03/21 ?0155 10/03/21 ?0225 10/04/21 ?[id number] 10/04/21 ?1646 10/05/21 ?[id number] 10/05/21 ?[id number] 10/06/21 ?0327  ?NA  --    < > 141   < > 146* 146*   < > 148* 149* 151* 152* 150*  ?K  --    < > 5.0   < > 5.0 4.8   < > 4.3 4.3 4.0 4.1 4.3  ?CL  --    < > 99   < > 100 100  --  102  --   --  109 110  ?CO2  --    < > 37*   < > 40* 40*  --  40*  --   --  36* 35*  ?GLUCOSE  --    < > 194*   < > 195* 196*  --  151*  --   --  163* 168*  ?BUN  --    < > 30*   < > 38* 36*  --  40*  --   --  59* 50*  ?CREATININE  --    < > 0.56   < > 0.68 0.64  --  0.66  --   --  0.92 0.65  ?CALCIUM  --    < > 8.8*   < > 8.7* 8.8*  --  9.0  --   --  8.7* 8.7*  ?MG 2.3  --  2.4  --  2.7*  --   --  2.5*  --   --   --  2.5*  ?PHOS  --   --   --   --   --   --   --   --   --   --  3.7 2.4*  ? < > = values in this interval not displayed.  ? ? ?GFR: ?Estimated Creatinine Clearance: 54.3 mL/min (by C-G formula based on SCr of 0.65 mg/dL). ?Recent Labs  ?Lab 10/01/21 ?0109 10/03/21 ?0155 10/04/21 ?[id number] 10/06/21 ?0327  ?WBC 16.8* 24.3* 26.9* 21.3*  ? ? ? ?Liver Function Tests: ?Recent Labs  ?Lab 09/30/21 ?[id number]  10/03/21 ?0155  10/06/21 ?0327  ?AST 126* 58* 48*  ?ALT 739* 339* 182*  ?ALKPHOS 94 135* 111  ?BILITOT 0.6 0.5 0.7  ?PROT 5.7* 6.1* 5.7*  ?ALBUMIN 3.1* 3.0* 2.6*  ? ? ?No results for input(s): LIPASE, AMYLASE in the last 168 hours. ?No results for input(s): AMMONIA in the last 168 hours. ? ?ABG ?   ?Component Value Date/Time  ? PHART 7.381 10/05/2021 0450  ? PCO2ART 68.9 ([place]) 10/05/2021 0450  ? PO2ART 81 (L) 10/05/2021 0450  ? HCO3 40.6 (H) 10/05/2021 0450  ? TCO2 43 (H) 10/05/2021 0450  ? ACIDBASEDEF 0.2 09/27/2021 0855  ? O2SAT 95 10/05/2021 0450  ? ?  ? ?Coagulation Profile: ?No results for input(s): INR, PROTIME in the last 168 hours. ? ?Cardiac Enzymes: ?No results for input(s): CKTOTAL, CKMB, CKMBINDEX, TROPONINI in the last 168 hours. ? ?HbA1C: ?Hgb A1c MFr Bld  ?Date/Time Value Ref Range Status  ?09/25/2021 08:08 AM 5.4 4.8 - 5.6 % Final  ?  Comment:  ?  (NOTE) ?Pre diabetes:          5.7%-6.4% ? ?Diabetes:              >6.4% ? ?Glycemic control for   <7.0% ?adults with diabetes ?  ? ? ?CBG: ?Recent Labs  ?Lab 10/05/21 ?1510 10/05/21 ?1910 10/05/21 ?2311 10/06/21 ?[id number] 10/06/21 ?[id number]  ?GLUCAP 213* 166* 157* 168* 165*  ? ? ?The patient is critically ill with multiple organ systems failure and requires high complexity decision making for assessment and support, frequent evaluation and titration of therapies, application of advanced monitoring technologies and extensive interpretation of multiple databases. Critical Care Time devoted to patient care services described in this note independent of APP/resident time (if applicable)  is 35 minutes.  ? ?Kara Mead MD. Shade Flood. ?Early Pulmonary & Critical care ?Pager : [phone] ? ?If no response to pager , please call [phone] until 7 pm ?After 7:00 pm call Elink  [phone]  ? ?10/06/2021 ? ?

## 2021-10-06 NOTE — Progress Notes (Addendum)
? ?Progress Note ? ?Patient Name: [name] ?Date of Encounter: 10/06/2021 ? ?[facility] Cardiologist: Dorris Carnes, MD  ? ?Subjective  ? ?Remains intubated and sedated.  Tele shows NSR to sinus tach with frequent PACs ? ?Inpatient Medications  ?  ?Scheduled Meds: ? arformoterol  15 mcg Nebulization BID  ? budesonide (PULMICORT) nebulizer solution  0.5 mg Nebulization BID  ? chlorhexidine gluconate (MEDLINE KIT)  15 mL Mouth Rinse BID  ? Chlorhexidine Gluconate Cloth  6 each Topical Q0600  ? clonazePAM  1 mg Per Tube BID  ? enoxaparin (LOVENOX) injection  40 mg Subcutaneous Q24H  ? feeding supplement (PROSource TF)  45 mL Per Tube Daily  ? free water  200 mL Per Tube Q4H  ? furosemide  40 mg Intravenous Daily  ? hydrALAZINE  25 mg Per Tube Q8H  ? insulin aspart  0-20 Units Subcutaneous Q4H  ? isosorbide dinitrate  20 mg Per Tube TID  ? mouth rinse  15 mL Mouth Rinse 10 times per day  ? methylPREDNISolone (SOLU-MEDROL) injection  40 mg Intravenous Daily  ? metoprolol tartrate  12.5 mg Per Tube BID  ? montelukast  10 mg Per Tube q AM  ? pantoprazole sodium  40 mg Per Tube Daily  ? potassium & sodium phosphates  2 packet Per Tube TID WC & HS  ? QUEtiapine  100 mg Per Tube BID  ? revefenacin  175 mcg Nebulization Daily  ? ?Continuous Infusions: ? sodium chloride 10 mL/hr at 10/06/21 0900  ? ceFEPime (MAXIPIME) IV 2 g (10/06/21 0916)  ? dexmedetomidine (PRECEDEX) IV infusion 0.8 mcg/kg/hr (10/06/21 0900)  ? dextrose 50 mL/hr at 10/06/21 0900  ? feeding supplement (OSMOLITE 1.2 CAL) 55 mL/hr at 10/05/21 0730  ? ketamine (KETALAR) Adult IV Infusion 0.5 mg/kg/hr (10/06/21 0900)  ? ?PRN Meds: ?sodium chloride, acetaminophen **OR** acetaminophen, docusate, fentaNYL, guaiFENesin-dextromethorphan, levalbuterol, polyethylene glycol, polyethylene glycol, senna, white petrolatum  ? ?Vital Signs  ?  ?Vitals:  ? 10/06/21 0630 10/06/21 0700 10/06/21 0732 10/06/21 0800  ?BP: (!) 119/58 (!) 157/72 (!) 157/70 130/66  ?Pulse: 94  98 97 89  ?Resp: (!) 28 (!) 29 (!) 26 (!) 29  ?Temp:  97.8 ?F (36.6 ?C)    ?TempSrc:  Oral    ?SpO2: 95% 95% 96% 93%  ?Weight:      ?Height:      ? ? ?Intake/Output Summary (Last 24 hours) at 10/06/2021 0920 ?Last data filed at 10/06/2021 0900 ?Gross per 24 hour  ?Intake 4457.95 ml  ?Output 1550 ml  ?Net 2907.95 ml  ? ? ? ?  10/06/2021  ?  5:00 AM 10/05/2021  ?  5:00 AM 10/04/2021  ?  5:00 AM  ?Last 3 Weights  ?Weight (lbs) 115 lb 11.9 oz 117 lb 11.6 oz 123 lb 10.9 oz  ?Weight (kg) 52.5 kg 53.4 kg 56.1 kg  ?   ? ?Telemetry  ?  ?Normal sinus rhythm- Personally Reviewed ? ?ECG  ?  ?No new since 4/17 - Personally Reviewed ? ?Physical Exam  ? ?GEN: Intubated and sedated ?HEENT: Normal ?NECK: No JVD; No carotid bruits ?LYMPHATICS: No lymphadenopathy ?CARDIAC:RRR, no murmurs, rubs, gallops with frequent ectopy ?RESPIRATORY:  coarse BS anteriorly ?ABDOMEN: Soft, non-tender, non-distended ?MUSCULOSKELETAL:  No edema; No deformity  ?SKIN: Warm and dry ?NEUROLOGIC: Cannot assess due to sedation ?PSYCHIATRIC: Cannot assess due to sedation ?Labs  ?  ?High Sensitivity Troponin:   ?Recent Labs  ?Lab 09/24/21 ?1707 09/24/21 ?1931  ?TROPONINIHS 7 12  ? ?   ?  Chemistry ?Recent Labs  ?Lab 09/30/21 ?3474 10/01/21 ?0109 10/02/21 ?2336 10/03/21 ?0155 10/03/21 ?0225 10/04/21 ?2595 10/04/21 ?1646 10/05/21 ?6387 10/05/21 ?5643 10/06/21 ?0327  ?NA 141   < > 146* 146*   < > 148*   < > 151* 152* 150*  ?K 4.9   < > 5.0 4.8   < > 4.3   < > 4.0 4.1 4.3  ?CL 104   < > 100 100  --  102  --   --  109 110  ?CO2 28   < > 40* 40*  --  40*  --   --  36* 35*  ?GLUCOSE 275*   < > 195* 196*  --  151*  --   --  163* 168*  ?BUN 30*   < > 38* 36*  --  40*  --   --  59* 50*  ?CREATININE 0.61   < > 0.68 0.64  --  0.66  --   --  0.92 0.65  ?CALCIUM 8.9   < > 8.7* 8.8*  --  9.0  --   --  8.7* 8.7*  ?MG  --    < > 2.7*  --   --  2.5*  --   --   --  2.5*  ?PROT 5.7*  --   --  6.1*  --   --   --   --   --  5.7*  ?ALBUMIN 3.1*  --   --  3.0*  --   --   --   --   --  2.6*   ?AST 126*  --   --  58*  --   --   --   --   --  48*  ?ALT 739*  --   --  339*  --   --   --   --   --  182*  ?ALKPHOS 94  --   --  135*  --   --   --   --   --  111  ?BILITOT 0.6  --   --  0.5  --   --   --   --   --  0.7  ?GFRNONAA >60   < > >60 >60  --  >60  --   --  >60 >60  ?ANIONGAP 9   < > 6 6  --  6  --   --  7 5  ? < > = values in this interval not displayed.  ? ?  ?Lipids No results for input(s): CHOL, TRIG, HDL, LABVLDL, LDLCALC, CHOLHDL in the last 168 hours.  ?Hematology ?Recent Labs  ?Lab 10/03/21 ?0155 10/03/21 ?0225 10/04/21 ?3295 10/04/21 ?1646 10/05/21 ?0450 10/06/21 ?0327  ?WBC 24.3*  --  26.9*  --   --  21.3*  ?RBC 3.74*  --  3.67*  --   --  3.47*  ?HGB 12.0   < > 11.8* 12.2 12.6 11.1*  ?HCT 38.2   < > 39.1 36.0 37.0 36.5  ?MCV 102.1*  --  106.5*  --   --  105.2*  ?MCH 32.1  --  32.2  --   --  32.0  ?MCHC 31.4  --  30.2  --   --  30.4  ?RDW 13.6  --  14.0  --   --  14.6  ?PLT 104*  --  117*  --   --  101*  ? < > = values in this interval not displayed.  ? ? ?  Thyroid No results for input(s): TSH, FREET4 in the last 168 hours.  ?BNP ?No results for input(s): BNP, PROBNP in the last 168 hours. ?  ?DDimer No results for input(s): DDIMER in the last 168 hours.  ? ?Radiology  ?  ?DG Abd Portable 1V ? ?Result Date: 10/05/2021 ?CLINICAL DATA:  Feeding tube placement. EXAM: PORTABLE ABDOMEN - 1 VIEW COMPARISON:  None. FINDINGS: Enteric tube appears adequately positioned in the stomach with tip directed towards the pylorus/duodenal bulb. Coarse lung markings are seen at each lung base, suggesting chronic interstitial lung disease/emphysema. IMPRESSION: Enteric tube adequately positioned in the stomach with tip directed towards the pylorus/duodenal bulb. Electronically Signed   By: Franki Cabot M.D.   On: 10/05/2021 10:04   ? ?Cardiac Studies  ? ?Echo:  4/202/23 ?  ?1. Limited visualization of the myocardium, incomplete assessment of wall  ?motion. Anteroseptal wall is akinetic. Apex is akinetic. Marland Kitchen Left   ?ventricular ejection fraction, by estimation, is 40%. The left ventricle  ?has mildly decreased function. The left  ?ventricle demonstrates regional wall motion abnormalities (see scoring  ?diagram/findings for description). Left ventricular diastolic parameters  ?are consistent with Grade I diastolic dysfunction (impaired relaxation).  ? 2. Right ventricular systolic function is normal. The right ventricular  ?size is normal. There is normal pulmonary artery systolic pressure.  ? 3. The mitral valve is normal in structure. No evidence of mitral valve  ?regurgitation. No evidence of mitral stenosis.  ? 4. The tricuspid valve is abnormal.  ? 5. The aortic valve is tricuspid. Aortic valve regurgitation is not  ?visualized. No aortic stenosis is present.  ? 6. The inferior vena cava is normal in size with greater than 50%  ?respiratory variability, suggesting right atrial pressure of 3 mmHg.  ? ?Patient Profile  ?   ?66 y.o. 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 Carles Collet. ? ?Assessment & Plan  ?  ?Cardiomyopathy ?Acute systolic and diastolic heart failure ?Hypertension ?-EF 40% with focal wall motion abnormalities. Concern for ischemic etiology ?-elevated BNP ?-Was not on home BP medication prior to admission.  ?-Fluctuation in BP likely related to agitation >> continue with shorter acting medications that can be held if needed. Once BP more stabilized, would add low dose ARB and monitor potassium closely. ?-For now continue hydralazine 25 mg per NGT every 8 hours and isosorbide dinitrate 20 mg NGT 3 times daily with plans to transition to ARB and subsequently Entresto at discharge if BP remains stable ?-BP stable right now at 130/66 with heart rate 89 bpm. ?-Increase Lopressor to 52m BID due to frequent PACs on tele and ST ?-will need ischemic workup at some point; with prolonged ICU course, would consider  either just prior to discharge when more stable or as an outpatient ?-platelets have been low, holding aspirin, start when platelets consistently normalized and no evidence of bleeding>>plt ct 1010K today ?

## 2021-10-07 ENCOUNTER — Inpatient Hospital Stay (HOSPITAL_COMMUNITY): Payer: Medicare Other

## 2021-10-07 DIAGNOSIS — I10 Essential (primary) hypertension: Secondary | ICD-10-CM | POA: Diagnosis not present

## 2021-10-07 DIAGNOSIS — R000 Tachycardia, unspecified: Secondary | ICD-10-CM

## 2021-10-07 DIAGNOSIS — G9340 Encephalopathy, unspecified: Secondary | ICD-10-CM | POA: Diagnosis not present

## 2021-10-07 DIAGNOSIS — J9601 Acute respiratory failure with hypoxia: Secondary | ICD-10-CM | POA: Diagnosis not present

## 2021-10-07 DIAGNOSIS — J189 Pneumonia, unspecified organism: Secondary | ICD-10-CM | POA: Diagnosis not present

## 2021-10-07 DIAGNOSIS — I5022 Chronic systolic (congestive) heart failure: Secondary | ICD-10-CM | POA: Diagnosis not present

## 2021-10-07 DIAGNOSIS — Y95 Nosocomial condition: Secondary | ICD-10-CM

## 2021-10-07 DIAGNOSIS — J9602 Acute respiratory failure with hypercapnia: Secondary | ICD-10-CM | POA: Diagnosis not present

## 2021-10-07 LAB — CBC WITH DIFFERENTIAL/PLATELET
Abs Immature Granulocytes: 0.21 10*3/uL — ABNORMAL HIGH (ref 0.00–0.07)
Basophils Absolute: 0 10*3/uL (ref 0.0–0.1)
Basophils Relative: 0 %
Eosinophils Absolute: 0 10*3/uL (ref 0.0–0.5)
Eosinophils Relative: 0 %
HCT: 36.1 % (ref 36.0–46.0)
Hemoglobin: 11.2 g/dL — ABNORMAL LOW (ref 12.0–15.0)
Immature Granulocytes: 1 %
Lymphocytes Relative: 4 %
Lymphs Abs: 0.9 10*3/uL (ref 0.7–4.0)
MCH: 31.7 pg (ref 26.0–34.0)
MCHC: 31 g/dL (ref 30.0–36.0)
MCV: 102.3 fL — ABNORMAL HIGH (ref 80.0–100.0)
Monocytes Absolute: 0.8 10*3/uL (ref 0.1–1.0)
Monocytes Relative: 3 %
Neutro Abs: 22.4 10*3/uL — ABNORMAL HIGH (ref 1.7–7.7)
Neutrophils Relative %: 92 %
Platelets: 102 10*3/uL — ABNORMAL LOW (ref 150–400)
RBC: 3.53 MIL/uL — ABNORMAL LOW (ref 3.87–5.11)
RDW: 14.3 % (ref 11.5–15.5)
WBC: 24.3 10*3/uL — ABNORMAL HIGH (ref 4.0–10.5)
nRBC: 0 % (ref 0.0–0.2)

## 2021-10-07 LAB — GLUCOSE, CAPILLARY
Glucose-Capillary: 129 mg/dL — ABNORMAL HIGH (ref 70–99)
Glucose-Capillary: 153 mg/dL — ABNORMAL HIGH (ref 70–99)
Glucose-Capillary: 154 mg/dL — ABNORMAL HIGH (ref 70–99)
Glucose-Capillary: 155 mg/dL — ABNORMAL HIGH (ref 70–99)
Glucose-Capillary: 201 mg/dL — ABNORMAL HIGH (ref 70–99)
Glucose-Capillary: 204 mg/dL — ABNORMAL HIGH (ref 70–99)

## 2021-10-07 LAB — BASIC METABOLIC PANEL
Anion gap: 6 (ref 5–15)
BUN: 44 mg/dL — ABNORMAL HIGH (ref 8–23)
CO2: 35 mmol/L — ABNORMAL HIGH (ref 22–32)
Calcium: 8.4 mg/dL — ABNORMAL LOW (ref 8.9–10.3)
Chloride: 103 mmol/L (ref 98–111)
Creatinine, Ser: 0.6 mg/dL (ref 0.44–1.00)
GFR, Estimated: >60 mL/min (ref >60)
Glucose, Bld: 199 mg/dL — ABNORMAL HIGH (ref 70–99)
Potassium: 4.2 mmol/L (ref 3.5–5.1)
Sodium: 144 mmol/L (ref 135–145)

## 2021-10-07 LAB — MAGNESIUM: Magnesium: 2.3 mg/dL (ref 1.7–2.4)

## 2021-10-07 LAB — PHOSPHORUS: Phosphorus: 4.1 mg/dL (ref 2.5–4.6)

## 2021-10-07 MED ORDER — HYDRALAZINE HCL 50 MG PO TABS
25.0000 mg | ORAL_TABLET | Freq: Three times a day (TID) | ORAL | Status: DC
  Administered 2021-10-07 – 2021-10-08 (×3): 25 mg
  Filled 2021-10-07 (×3): qty 1

## 2021-10-07 MED ORDER — METOPROLOL TARTRATE 25 MG PO TABS
25.0000 mg | ORAL_TABLET | Freq: Once | ORAL | Status: AC
  Administered 2021-10-07: 25 mg
  Filled 2021-10-07: qty 1

## 2021-10-07 MED ORDER — HYDRALAZINE HCL 50 MG PO TABS: 50.0000 mg | ORAL_TABLET | Freq: Three times a day (TID) | ORAL | Status: DC

## 2021-10-07 MED ORDER — METOPROLOL TARTRATE 25 MG PO TABS
50.0000 mg | ORAL_TABLET | Freq: Two times a day (BID) | ORAL | Status: DC
  Administered 2021-10-07: 50 mg
  Filled 2021-10-07 (×2): qty 2

## 2021-10-07 NOTE — Progress Notes (Addendum)
? ?Progress Note ? ?Patient Name: [name] ?Date of Encounter: 10/07/2021 ? ?[facility] Cardiologist: Dorris Carnes, MD  ? ?Subjective  ? ?Remains intubated and sedated.  Still tachy with frequent PACs on tele ? ?Inpatient Medications  ?  ?Scheduled Meds: ? arformoterol  15 mcg Nebulization BID  ? budesonide (PULMICORT) nebulizer solution  0.5 mg Nebulization BID  ? chlorhexidine gluconate (MEDLINE KIT)  15 mL Mouth Rinse BID  ? Chlorhexidine Gluconate Cloth  6 each Topical Q0600  ? clonazePAM  1 mg Per Tube BID  ? enoxaparin (LOVENOX) injection  40 mg Subcutaneous Q24H  ? feeding supplement (PROSource TF)  45 mL Per Tube Daily  ? free water  200 mL Per Tube Q4H  ? furosemide  40 mg Intravenous Daily  ? hydrALAZINE  50 mg Per Tube Q8H  ? insulin aspart  0-20 Units Subcutaneous Q4H  ? isosorbide dinitrate  20 mg Per Tube TID  ? mouth rinse  15 mL Mouth Rinse 10 times per day  ? methylPREDNISolone (SOLU-MEDROL) injection  40 mg Intravenous Daily  ? metoprolol tartrate  25 mg Per Tube BID  ? montelukast  10 mg Per Tube q AM  ? pantoprazole sodium  40 mg Per Tube Daily  ? QUEtiapine  100 mg Per Tube BID  ? revefenacin  175 mcg Nebulization Daily  ? ?Continuous Infusions: ? sodium chloride 10 mL/hr at 10/07/21 0900  ? cefTRIAXone (ROCEPHIN)  IV Stopped (10/06/21 1419)  ? dexmedetomidine (PRECEDEX) IV infusion 0.5 mcg/kg/hr (10/07/21 0900)  ? feeding supplement (OSMOLITE 1.2 CAL) 55 mL/hr at 10/05/21 0730  ? ?PRN Meds: ?sodium chloride, acetaminophen **OR** acetaminophen, docusate, fentaNYL, guaiFENesin-dextromethorphan, levalbuterol, polyethylene glycol, polyethylene glycol, senna, white petrolatum  ? ?Vital Signs  ?  ?Vitals:  ? 10/07/21 0736 10/07/21 0800 10/07/21 0815 10/07/21 0900  ?BP:  (!) 176/73  (!) 170/67  ?Pulse:  99 (!) 109 (!) 111  ?Resp:  (!) 23 (!) 23 (!) 27  ?Temp: (!) 97.2 ?F (36.2 ?C)     ?TempSrc: Oral     ?SpO2:  93% 92% 91%  ?Weight:      ?Height:      ? ? ?Intake/Output Summary (Last 24 hours)  at 10/07/2021 1055 ?Last data filed at 10/07/2021 0900 ?Gross per 24 hour  ?Intake 3377.52 ml  ?Output 1275 ml  ?Net 2102.52 ml  ? ? ? ?  10/07/2021  ?  5:00 AM 10/06/2021  ?  5:00 AM 10/05/2021  ?  5:00 AM  ?Last 3 Weights  ?Weight (lbs) 122 lb 2.2 oz 115 lb 11.9 oz 117 lb 11.6 oz  ?Weight (kg) 55.4 kg 52.5 kg 53.4 kg  ?   ? ?Telemetry  ?  ?Sinus tachycardia with frequent PACs- Personally Reviewed ? ?ECG  ?  ?No new since 4/17 - Personally Reviewed ? ?Physical Exam  ? ?GEN: Ill-appearing, intubated and sedated ?HEENT: Normal ?NECK: No JVD; No carotid bruits ?LYMPHATICS: No lymphadenopathy ?CARDIAC:tachy with occasional ectopy, no murmurs, rubs, gallops ?RESPIRATORY:  Clear to auscultation without rales, wheezing or rhonchi  ?ABDOMEN: Soft, non-tender, non-distended ?MUSCULOSKELETAL:  No edema; No deformity  ?SKIN: Warm and dry ?NEUROLOGIC: Cannot assess sedated and intubated  ?PSYCHIATRIC: Cannot assess sedated and intubated ?Labs  ?  ?High Sensitivity Troponin:   ?Recent Labs  ?Lab 09/24/21 ?1707 09/24/21 ?1931  ?TROPONINIHS 7 12  ? ?   ?Chemistry ?Recent Labs  ?Lab 10/03/21 ?0155 10/03/21 ?0225 10/04/21 ?4580 10/04/21 ?1646 10/05/21 ?9983 10/06/21 ?0327 10/07/21 ?0124  ?  NA 146*   < > 148*   < > 152* 150* 144  ?K 4.8   < > 4.3   < > 4.1 4.3 4.2  ?CL 100  --  102  --  109 110 103  ?CO2 40*  --  40*  --  36* 35* 35*  ?GLUCOSE 196*  --  151*  --  163* 168* 199*  ?BUN 36*  --  40*  --  59* 50* 44*  ?CREATININE 0.64  --  0.66  --  0.92 0.65 0.60  ?CALCIUM 8.8*  --  9.0  --  8.7* 8.7* 8.4*  ?MG  --   --  2.5*  --   --  2.5* 2.3  ?PROT 6.1*  --   --   --   --  5.7*  --   ?ALBUMIN 3.0*  --   --   --   --  2.6*  --   ?AST 58*  --   --   --   --  48*  --   ?ALT 339*  --   --   --   --  182*  --   ?ALKPHOS 135*  --   --   --   --  111  --   ?BILITOT 0.5  --   --   --   --  0.7  --   ?GFRNONAA >60  --  >60  --  >60 >60 >60  ?ANIONGAP 6  --  6  --  '7 5 6  ' ? < > = values in this interval not displayed.  ? ?  ?Lipids No results for  input(s): CHOL, TRIG, HDL, LABVLDL, LDLCALC, CHOLHDL in the last 168 hours.  ?Hematology ?Recent Labs  ?Lab 10/04/21 ?7121 10/04/21 ?1646 10/05/21 ?0450 10/06/21 ?0327 10/07/21 ?0124  ?WBC 26.9*  --   --  21.3* 24.3*  ?RBC 3.67*  --   --  3.47* 3.53*  ?HGB 11.8*   < > 12.6 11.1* 11.2*  ?HCT 39.1   < > 37.0 36.5 36.1  ?MCV 106.5*  --   --  105.2* 102.3*  ?MCH 32.2  --   --  32.0 31.7  ?MCHC 30.2  --   --  30.4 31.0  ?RDW 14.0  --   --  14.6 14.3  ?PLT 117*  --   --  101* 102*  ? < > = values in this interval not displayed.  ? ? ?Thyroid No results for input(s): TSH, FREET4 in the last 168 hours.  ?BNP ?No results for input(s): BNP, PROBNP in the last 168 hours. ?  ?DDimer No results for input(s): DDIMER in the last 168 hours.  ? ?Radiology  ?  ?DG Chest Port 1 View ? ?Result Date: 10/07/2021 ?CLINICAL DATA:  Acute respiratory failure in a 66 year old female. EXAM: PORTABLE CHEST 1 VIEW COMPARISON:  Comparison made with October 03, 2021. FINDINGS: Endotracheal tube terminates approximately 2.7 cm from the carina similar to prior imaging. Feeding tube courses through in off the field of the radiograph, terminating below the diaphragm. EKG leads project over the patient's chest. Image rotated to the RIGHT. Accounting for this cardiomediastinal contours and hilar structures are stable. Signs of pulmonary emphysema are noted. No lobar consolidation. Lungs are hyperinflated. No gross effusion. On limited assessment there is no acute skeletal process. IMPRESSION: 1. No interval change in the appearance of the chest. 2. COPD. Electronically Signed   By: Zetta Bills M.D.   On: 10/07/2021  09:14   ? ?Cardiac Studies  ? ?Echo:  4/202/23 ?  ?1. Limited visualization of the myocardium, incomplete assessment of wall  ?motion. Anteroseptal wall is akinetic. Apex is akinetic. Marland Kitchen Left  ?ventricular ejection fraction, by estimation, is 40%. The left ventricle  ?has mildly decreased function. The left  ?ventricle demonstrates regional  wall motion abnormalities (see scoring  ?diagram/findings for description). Left ventricular diastolic parameters  ?are consistent with Grade I diastolic dysfunction (impaired relaxation).  ? 2. Right ventricular systolic function is normal. The right ventricular  ?size is normal. There is normal pulmonary artery systolic pressure.  ? 3. The mitral valve is normal in structure. No evidence of mitral valve  ?regurgitation. No evidence of mitral stenosis.  ? 4. The tricuspid valve is abnormal.  ? 5. The aortic valve is tricuspid. Aortic valve regurgitation is not  ?visualized. No aortic stenosis is present.  ? 6. The inferior vena cava is normal in size with greater than 50%  ?respiratory variability, suggesting right atrial pressure of 3 mmHg.  ? ?Patient Profile  ?   ?66 y.o. 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 Carles Collet. ? ?Assessment & Plan  ?  ?Cardiomyopathy ?Acute systolic and diastolic heart failure ?Hypertension ?-EF 40% with focal wall motion abnormalities. Concern for ischemic etiology ?-elevated BNP ?-Was not on home BP medication prior to admission.  ?-Fluctuation in BP likely related to agitation >> continue with shorter acting medications that can be held if needed. Once BP more stabilized, would add low dose ARB and monitor potassium closely. ?-BP has been elevated over the past 24 hours ?-continue isosorbide dinitrate 20 mg per NG tube 3 times daily and hydralazine 25 mg every 8 hours per NG tube  ?-plan to transition to ARB and subsequently Entresto at discharge if BP remains stable ?-Increase Lopressor to 50 mg twice daily for HTN first instead of Hydralazine due to coexistent tachycardia with frequent atrial ectopy ?-will need ischemic workup at some point; with prolonged ICU course, would consider either just prior to discharge when more stable or as an outpatient ?-platelets have been low,  holding aspirin, start when platelets consistently normalized and no evidence of bleeding>>plt ct 102K today ? ?Acute hypoxic respiratory failure ?-COPD exacerbation ?-management per PCCM ? ?Hyperlipidem

## 2021-10-07 NOTE — Progress Notes (Signed)
Wasted 50 mL Ketamine in [facility] with Wannetta Sender RN. ?

## 2021-10-07 NOTE — Progress Notes (Signed)
Ketamine bag returned to [place] in pharmacy. ?

## 2021-10-07 NOTE — Progress Notes (Signed)
? ?NAME:  [name], MRN:  [id number], DOB:  1955/07/11, LOS: 12 ?ADMISSION DATE:  09/24/2021, CONSULTATION DATE:  4/18 ?REFERRING MD:  Tat/ triad, CHIEF COMPLAINT:  resp distress   ? ?History of Present Illness:  ?66 y.o. female quit smoking 2021 with  GOLD 3 COPD MZ, uncontrolled anxiety. Was on 02 one year PTA but "took herself off" per friend at bedside and able to care for pets at home but mostly housebound sinc ethen ? ?She was admitted 4/17 to [facility], ICU with acute hypoxic/hypercarbic respiratory failure, failed BiPAP and required mechanical ventilation, transferred to [facility] 4/21 due to new finding of LV dysfunction ? ? ? ?Significant Hospital Events: ?Including procedures, antibiotic start and stop dates in addition to other pertinent events   ?ET  4/18 c/b hypotension with severe air trapping  ?Echo 4/20 with EF 40% , akinesis of apex and septal wall ?4/24 CTA head negative for any significant abnormality ?4/25 ketamine added ?4/28 increased work of breathing during the wean, severe auto PEEP ?4/29 Febrile 101.2 ? ?Interim History / Subjective:  ? ?Remains critically ill, intubated ?Defervesced ?Good urine output with Lasix, remains +15 L ?Sedated on ketamine and Precedex ? ?Objective   ?Blood pressure (!) 144/63, pulse (!) 103, temperature (!) 97.2 ?F (36.2 ?C), temperature source Oral, resp. rate (!) 29, height 5\' 1"  (1.549 m), weight 55.4 kg, SpO2 93 %. ?   ?Vent Mode: PRVC ?FiO2 (%):  [40 %] 40 % ?Set Rate:  [20 bmp] 20 bmp ?Vt Set:  [430 mL] 430 mL ?PEEP:  [5 cmH20] 5 cmH20 ?Plateau Pressure:  [15 [id number] cmH20] 21 cmH20  ? ?Intake/Output Summary (Last 24 hours) at 10/07/2021 0840 ?Last data filed at 10/07/2021 0700 ?Gross per 24 hour  ?Intake 3542.65 ml  ?Output 1275 ml  ?Net 2267.65 ml  ? ? ?Filed Weights  ? 10/05/21 0500 10/06/21 0500 10/07/21 0500  ?Weight: 53.4 kg 52.5 kg 55.4 kg  ? ? ?Examination: ?General-chronically ill-appearing , thin and frail, appears mild distress on vent ?HEENT-dry  oral mucosa, endotracheal tube in place ?neck -trachea is midline , no JVD ?respiratory -mild accessory muscle use, decreased breath sounds bilateral, tachypnea with ineffective triggers, no rhonchi ?CV -S1-S2 tacky ?GI -bowel sounds appreciated ?Neuro -sedate, RASS -2, does not follow commands ? ?Chest x-ray 4/30 independently reviewed shows improved bilateral interstitial infiltrates ?Labs show sodium normalized, other electrolytes normal, persistent leukocytosis, mild thrombocytopenia ?ABG shows compensated hypercarbia ? ? ?Assessment & Plan:  ? ?Acute hypoxemic/hypercapnic respiratory failure ?Persistent tachypnea with ineffective trigger and auto PEEP in spite of adjusting vent, does not seem to be a sedation issue ? ?-Target TVol 6-8cc/kgIBW ?-Target Plateau Pressure < 30cm H20 ?-Ventilator associated pneumonia prevention protocol ?-Does not tolerate spontaneous breathing, plan for tracheostomy ? ? ?HAP -bibasilar pneumonia , completed azithromycin course ?-Respiratory culture 4/27 showing Klebsiella ?-Simplify antibiotics from cefepime to ceftriaxone, plan for 7 days total ? ? ?Acute metabolic encephalopathy ?Severe anxiety ?-Continue Precedex, dc ketamine , use fentanyl intermittently only ?-Goal RASS 0 to -1 ?-On Seroquel 100 twice daily ?-Continue Klonopin ? ?Acute exacerbation of COPD ?Gold stage 4 COPD -FEV1 baseline 35% ?-Continue Brovana, Yupelri, Pulmicort ?-Continue 40 solumedrol  daily ? ?Acute systolic heart failure ?-New drop in EF of 40% with septal apical akinesis concerning for ischemia ?-Isordil and hydralazine per cardiology, increase hydralazine to 50 3 times daily ?-Low-dose metoprolol ordered , okay to titrate up since no bronchospasm ?-Okay to add ACE/ARB since blood pressure high ?-will need  ischemic work-up at some point ?-Aspirin on hold due to low platelets ?-lasix 40 daily ? ?Hypernatremia -resolved  ?-Continue free water 200 every 4  ?-DC D5W ?-Check bmet  daily ? ?Thrombocytopenia ?-Platelets low but stable ? ?Goals of care ?-Discussed tracheostomy in detail with sister 4/29-planned this week ?-Sister will update patient's spouse -he is struggling after recent hospital admission and new diagnosis of lung cancer ?-Sister understands that she will likely need facility care and high chance of vent dependence ? ? ?Best Practice (right click and "Reselect all SmartList Selections" daily)  ? ?PPI ?Enoxaparin ? ?Labs   ?CBC: ?Recent Labs  ?Lab 10/01/21 ?0109 10/01/21 ?0912 10/03/21 ?0155 10/03/21 ?0225 10/04/21 ?[id number] 10/04/21 ?1646 10/05/21 ?0450 10/06/21 ?0327 10/07/21 ?0124  ?WBC 16.8*  --  24.3*  --  26.9*  --   --  21.3* 24.3*  ?NEUTROABS  --   --  22.6*  --  24.5*  --   --  19.1* 22.4*  ?HGB 11.6*   < > 12.0   < > 11.8* 12.2 12.6 11.1* 11.2*  ?HCT 35.8*   < > 38.2   < > 39.1 36.0 37.0 36.5 36.1  ?MCV 100.8*  --  102.1*  --  106.5*  --   --  105.2* 102.3*  ?PLT 89*  --  104*  --  117*  --   --  101* 102*  ? < > = values in this interval not displayed.  ? ? ? ?Basic Metabolic Panel: ?Recent Labs  ?Lab 10/01/21 ?0109 10/01/21 ?0912 10/02/21 ?2336 10/03/21 ?0155 10/03/21 ?0225 10/04/21 ?[id number] 10/04/21 ?1646 10/05/21 ?[id number] 10/05/21 ?[id number] 10/06/21 ?0327 10/07/21 ?0124  ?NA 141   < > 146* 146*   < > 148* 149* 151* 152* 150* 144  ?K 5.0   < > 5.0 4.8   < > 4.3 4.3 4.0 4.1 4.3 4.2  ?CL 99   < > 100 100  --  102  --   --  109 110 103  ?CO2 37*   < > 40* 40*  --  40*  --   --  36* 35* 35*  ?GLUCOSE 194*   < > 195* 196*  --  151*  --   --  163* 168* 199*  ?BUN 30*   < > 38* 36*  --  40*  --   --  59* 50* 44*  ?CREATININE 0.56   < > 0.68 0.64  --  0.66  --   --  0.92 0.65 0.60  ?CALCIUM 8.8*   < > 8.7* 8.8*  --  9.0  --   --  8.7* 8.7* 8.4*  ?MG 2.4  --  2.7*  --   --  2.5*  --   --   --  2.5* 2.3  ?PHOS  --   --   --   --   --   --   --   --  3.7 2.4* 4.1  ? < > = values in this interval not displayed.  ? ? ?GFR: ?Estimated Creatinine Clearance: 54.3 mL/min (by C-G formula based on  SCr of 0.6 mg/dL). ?Recent Labs  ?Lab 10/03/21 ?0155 10/04/21 ?[id number] 10/06/21 ?0327 10/07/21 ?0124  ?WBC 24.3* 26.9* 21.3* 24.3*  ? ? ? ?Liver Function Tests: ?Recent Labs  ?Lab 10/03/21 ?0155 10/06/21 ?0327  ?AST 58* 48*  ?ALT 339* 182*  ?ALKPHOS 135* 111  ?BILITOT 0.5 0.7  ?PROT 6.1* 5.7*  ?ALBUMIN 3.0* 2.6*  ? ? ?No results for  input(s): LIPASE, AMYLASE in the last 168 hours. ?No results for input(s): AMMONIA in the last 168 hours. ? ?ABG ?   ?Component Value Date/Time  ? PHART 7.381 10/05/2021 0450  ? PCO2ART 68.9 ([place]) 10/05/2021 0450  ? PO2ART 81 (L) 10/05/2021 0450  ? HCO3 40.6 (H) 10/05/2021 0450  ? TCO2 43 (H) 10/05/2021 0450  ? ACIDBASEDEF 0.2 09/27/2021 0855  ? O2SAT 95 10/05/2021 0450  ? ?  ? ?Coagulation Profile: ?No results for input(s): INR, PROTIME in the last 168 hours. ? ?Cardiac Enzymes: ?No results for input(s): CKTOTAL, CKMB, CKMBINDEX, TROPONINI in the last 168 hours. ? ?HbA1C: ?Hgb A1c MFr Bld  ?Date/Time Value Ref Range Status  ?09/25/2021 08:08 AM 5.4 4.8 - 5.6 % Final  ?  Comment:  ?  (NOTE) ?Pre diabetes:          5.7%-6.4% ? ?Diabetes:              >6.4% ? ?Glycemic control for   <7.0% ?adults with diabetes ?  ? ? ?CBG: ?Recent Labs  ?Lab 10/06/21 ?1549 10/06/21 ?1929 10/06/21 ?2345 10/07/21 ?[id number] 10/07/21 ?[id number]  ?GLUCAP 210* 146* 167* 153* 201*  ? ? ?The patient is critically ill with multiple organ systems failure and requires high complexity decision making for assessment and support, frequent evaluation and titration of therapies, application of advanced monitoring technologies and extensive interpretation of multiple databases. Critical Care Time devoted to patient care services described in this note independent of APP/resident time (if applicable)  is 34 minutes.  ? ?Kara Mead MD. Shade Flood. ?[facility] ?Pager : [phone] ? ?If no response to pager , please call [phone] until 7 pm ?After 7:00 pm call Elink  [phone]  ? ?10/07/2021 ? ?

## 2021-10-08 ENCOUNTER — Inpatient Hospital Stay (HOSPITAL_COMMUNITY): Payer: Medicare Other

## 2021-10-08 DIAGNOSIS — J9601 Acute respiratory failure with hypoxia: Secondary | ICD-10-CM | POA: Diagnosis not present

## 2021-10-08 DIAGNOSIS — I5022 Chronic systolic (congestive) heart failure: Secondary | ICD-10-CM | POA: Diagnosis not present

## 2021-10-08 DIAGNOSIS — J9602 Acute respiratory failure with hypercapnia: Secondary | ICD-10-CM | POA: Diagnosis not present

## 2021-10-08 LAB — BASIC METABOLIC PANEL
Anion gap: 8 (ref 5–15)
BUN: 42 mg/dL — ABNORMAL HIGH (ref 8–23)
CO2: 33 mmol/L — ABNORMAL HIGH (ref 22–32)
Calcium: 8.3 mg/dL — ABNORMAL LOW (ref 8.9–10.3)
Chloride: 102 mmol/L (ref 98–111)
Creatinine, Ser: 0.43 mg/dL — ABNORMAL LOW (ref 0.44–1.00)
GFR, Estimated: >60 mL/min (ref >60)
Glucose, Bld: 113 mg/dL — ABNORMAL HIGH (ref 70–99)
Potassium: 4.1 mmol/L (ref 3.5–5.1)
Sodium: 143 mmol/L (ref 135–145)

## 2021-10-08 LAB — CBC WITH DIFFERENTIAL/PLATELET
Abs Immature Granulocytes: 0.14 10*3/uL — ABNORMAL HIGH (ref 0.00–0.07)
Basophils Absolute: 0 10*3/uL (ref 0.0–0.1)
Basophils Relative: 0 %
Eosinophils Absolute: 0 10*3/uL (ref 0.0–0.5)
Eosinophils Relative: 0 %
HCT: 34.9 % — ABNORMAL LOW (ref 36.0–46.0)
Hemoglobin: 11 g/dL — ABNORMAL LOW (ref 12.0–15.0)
Immature Granulocytes: 1 %
Lymphocytes Relative: 4 %
Lymphs Abs: 0.7 10*3/uL (ref 0.7–4.0)
MCH: 31.9 pg (ref 26.0–34.0)
MCHC: 31.5 g/dL (ref 30.0–36.0)
MCV: 101.2 fL — ABNORMAL HIGH (ref 80.0–100.0)
Monocytes Absolute: 0.6 10*3/uL (ref 0.1–1.0)
Monocytes Relative: 3 %
Neutro Abs: 17.7 10*3/uL — ABNORMAL HIGH (ref 1.7–7.7)
Neutrophils Relative %: 92 %
Platelets: 91 10*3/uL — ABNORMAL LOW (ref 150–400)
RBC: 3.45 MIL/uL — ABNORMAL LOW (ref 3.87–5.11)
RDW: 14.2 % (ref 11.5–15.5)
WBC: 19.3 10*3/uL — ABNORMAL HIGH (ref 4.0–10.5)
nRBC: 0 % (ref 0.0–0.2)

## 2021-10-08 LAB — GLUCOSE, CAPILLARY
Glucose-Capillary: 115 mg/dL — ABNORMAL HIGH (ref 70–99)
Glucose-Capillary: 137 mg/dL — ABNORMAL HIGH (ref 70–99)
Glucose-Capillary: 122 mg/dL — ABNORMAL HIGH (ref 70–99)
Glucose-Capillary: 136 mg/dL — ABNORMAL HIGH (ref 70–99)
Glucose-Capillary: 208 mg/dL — ABNORMAL HIGH (ref 70–99)

## 2021-10-08 LAB — MAGNESIUM: Magnesium: 2.3 mg/dL (ref 1.7–2.4)

## 2021-10-08 LAB — PHOSPHORUS: Phosphorus: 3.1 mg/dL (ref 2.5–4.6)

## 2021-10-08 MED ORDER — SODIUM CHLORIDE 0.9 % IV SOLN
250.0000 mL | INTRAVENOUS | Status: DC
Start: 2021-10-08 — End: 2021-11-09

## 2021-10-08 MED ORDER — FREE WATER
100.0000 mL | Status: DC
  Administered 2021-10-08 – 2021-10-10 (×12): 100 mL

## 2021-10-08 MED ORDER — NOREPINEPHRINE 4 MG/250ML-% IV SOLN: 2.0000 ug/min | INTRAVENOUS | Status: DC

## 2021-10-08 MED ORDER — NOREPINEPHRINE 4 MG/250ML-% IV SOLN
INTRAVENOUS | Status: AC
  Administered 2021-10-08: 5 ug/min via INTRAVENOUS
  Filled 2021-10-08: qty 250

## 2021-10-08 MED ORDER — ROCURONIUM BROMIDE 10 MG/ML (PF) SYRINGE
PREFILLED_SYRINGE | INTRAVENOUS | Status: AC
Start: 2021-10-08 — End: 2021-10-08
  Filled 2021-10-08: qty 10

## 2021-10-08 MED ORDER — ROSUVASTATIN CALCIUM 20 MG PO TABS
20.0000 mg | ORAL_TABLET | Freq: Every day | ORAL | Status: DC
  Administered 2021-10-08 – 2021-11-05 (×29): 20 mg via NASOGASTRIC
  Filled 2021-10-08 (×29): qty 1

## 2021-10-08 MED ORDER — FENTANYL CITRATE (PF) 100 MCG/2ML IJ SOLN: 200.0000 ug | Freq: Once | INTRAMUSCULAR | Status: DC

## 2021-10-08 MED ORDER — ASPIRIN 81 MG PO CHEW
81.0000 mg | CHEWABLE_TABLET | Freq: Every day | ORAL | Status: DC
  Administered 2021-10-08 – 2021-10-28 (×21): 81 mg via NASOGASTRIC
  Filled 2021-10-08 (×21): qty 1

## 2021-10-08 MED ORDER — FENTANYL 2500MCG IN NS 250ML (10MCG/ML) PREMIX INFUSION
50.0000 ug/h | INTRAVENOUS | Status: DC
  Administered 2021-10-08: 50 ug/h via INTRAVENOUS
  Administered 2021-10-09: 100 ug/h via INTRAVENOUS
  Filled 2021-10-08 (×2): qty 250

## 2021-10-08 MED ORDER — ETOMIDATE 2 MG/ML IV SOLN
INTRAVENOUS | Status: AC
  Filled 2021-10-08: qty 10

## 2021-10-08 MED ORDER — ETOMIDATE 2 MG/ML IV SOLN
20.0000 mg | Freq: Once | INTRAVENOUS | Status: AC
  Administered 2021-10-08: 20 mg via INTRAVENOUS

## 2021-10-08 MED ORDER — DOCUSATE SODIUM 50 MG/5ML PO LIQD
100.0000 mg | Freq: Two times a day (BID) | ORAL | Status: DC
  Administered 2021-10-11 – 2021-10-12 (×3): 100 mg
  Filled 2021-10-08 (×6): qty 10

## 2021-10-08 MED ORDER — MIDAZOLAM HCL 2 MG/2ML IJ SOLN
INTRAMUSCULAR | Status: AC
  Administered 2021-10-08: 2 mg
  Filled 2021-10-08: qty 2

## 2021-10-08 MED ORDER — MIDAZOLAM HCL 2 MG/2ML IJ SOLN: 5.0000 mg | Freq: Once | INTRAMUSCULAR | Status: AC

## 2021-10-08 MED ORDER — POLYETHYLENE GLYCOL 3350 17 G PO PACK
17.0000 g | PACK | Freq: Every day | ORAL | Status: DC
  Administered 2021-10-11 – 2021-10-12 (×2): 17 g
  Filled 2021-10-08 (×2): qty 1

## 2021-10-08 MED ORDER — FENTANYL BOLUS VIA INFUSION
50.0000 ug | INTRAVENOUS | Status: DC | PRN
  Administered 2021-10-08 – 2021-10-09 (×3): 50 ug via INTRAVENOUS
  Filled 2021-10-08: qty 100

## 2021-10-08 MED ORDER — FENTANYL CITRATE (PF) 100 MCG/2ML IJ SOLN: 50.0000 ug | Freq: Once | INTRAMUSCULAR | Status: DC

## 2021-10-08 MED ORDER — ROCURONIUM BROMIDE 50 MG/5ML IV SOLN
60.0000 mg | Freq: Once | INTRAVENOUS | Status: AC
  Administered 2021-10-08: 60 mg via INTRAVENOUS
  Filled 2021-10-08: qty 6

## 2021-10-08 NOTE — Progress Notes (Signed)
? ?Progress Note ? ?Patient Name: [name] ?Date of Encounter: 10/08/2021 ? ?[facility] Cardiologist: Dorris Carnes, MD  ? ?Subjective  ? ?Remains intubated and unresponsive ? ?Inpatient Medications  ?  ?Scheduled Meds: ? arformoterol  15 mcg Nebulization BID  ? budesonide (PULMICORT) nebulizer solution  0.5 mg Nebulization BID  ? chlorhexidine gluconate (MEDLINE KIT)  15 mL Mouth Rinse BID  ? Chlorhexidine Gluconate Cloth  6 each Topical Q0600  ? clonazePAM  1 mg Per Tube BID  ? enoxaparin (LOVENOX) injection  40 mg Subcutaneous Q24H  ? feeding supplement (PROSource TF)  45 mL Per Tube Daily  ? free water  200 mL Per Tube Q4H  ? furosemide  40 mg Intravenous Daily  ? hydrALAZINE  25 mg Per Tube Q8H  ? insulin aspart  0-20 Units Subcutaneous Q4H  ? isosorbide dinitrate  20 mg Per Tube TID  ? mouth rinse  15 mL Mouth Rinse 10 times per day  ? methylPREDNISolone (SOLU-MEDROL) injection  40 mg Intravenous Daily  ? metoprolol tartrate  50 mg Per Tube BID  ? montelukast  10 mg Per Tube q AM  ? pantoprazole sodium  40 mg Per Tube Daily  ? QUEtiapine  100 mg Per Tube BID  ? revefenacin  175 mcg Nebulization Daily  ? ?Continuous Infusions: ? sodium chloride 10 mL/hr at 10/08/21 0700  ? cefTRIAXone (ROCEPHIN)  IV Stopped (10/07/21 1427)  ? dexmedetomidine (PRECEDEX) IV infusion 0.9 mcg/kg/hr (10/08/21 0700)  ? feeding supplement (OSMOLITE 1.2 CAL) 55 mL/hr at 10/05/21 0730  ? ?PRN Meds: ?sodium chloride, acetaminophen **OR** acetaminophen, docusate, fentaNYL, guaiFENesin-dextromethorphan, levalbuterol, polyethylene glycol, polyethylene glycol, senna, white petrolatum  ? ?Vital Signs  ?  ?Vitals:  ? 10/08/21 4540 10/08/21 0746 10/08/21 0747 10/08/21 0748  ?BP:    (!) 160/64  ?Pulse:    96  ?Resp:    (!) 29  ?Temp: 99.7 ?F (37.6 ?C)     ?TempSrc: Axillary     ?SpO2:  93% 93% 93%  ?Weight:      ?Height:      ? ? ?Intake/Output Summary (Last 24 hours) at 10/08/2021 0802 ?Last data filed at 10/08/2021 0700 ?Gross per 24 hour   ?Intake 3443.24 ml  ?Output 1050 ml  ?Net 2393.24 ml  ? ? ?  10/08/2021  ?  3:47 AM 10/07/2021  ?  5:00 AM 10/06/2021  ?  5:00 AM  ?Last 3 Weights  ?Weight (lbs) 123 lb 3.8 oz 122 lb 2.2 oz 115 lb 11.9 oz  ?Weight (kg) 55.9 kg 55.4 kg 52.5 kg  ?   ? ?Telemetry  ?  ?Sinus with PAT - Personally Reviewed ? ?Physical Exam  ? ?GEN: Intubated ?Neck: No JVD ?Cardiac: RRR ?Respiratory: Clear to auscultation anteriorly. ?GI: Soft, nontender, non-distended  ?MS: No edema ?Neuro:  Intubated and sedated ? ?Labs  ?  ?High Sensitivity Troponin:   ?Recent Labs  ?Lab 09/24/21 ?1707 09/24/21 ?1931  ?TROPONINIHS 7 12  ?   ?Chemistry ?Recent Labs  ?Lab 10/03/21 ?0155 10/03/21 ?0225 10/06/21 ?0327 10/07/21 ?0124 10/08/21 ?0112  ?NA 146*   < > 150* 144 143  ?K 4.8   < > 4.3 4.2 4.1  ?CL 100   < > 110 103 102  ?CO2 40*   < > 35* 35* 33*  ?GLUCOSE 196*   < > 168* 199* 113*  ?BUN 36*   < > 50* 44* 42*  ?CREATININE 0.64   < > 0.65 0.60 0.43*  ?  CALCIUM 8.8*   < > 8.7* 8.4* 8.3*  ?MG  --    < > 2.5* 2.3 2.3  ?PROT 6.1*  --  5.7*  --   --   ?ALBUMIN 3.0*  --  2.6*  --   --   ?AST 58*  --  48*  --   --   ?ALT 339*  --  182*  --   --   ?ALKPHOS 135*  --  111  --   --   ?BILITOT 0.5  --  0.7  --   --   ?GFRNONAA >60   < > >60 >60 >60  ?ANIONGAP 6   < > _0 ? < > = values in this interval not displayed.  ?  ? ?Hematology ?Recent Labs  ?Lab 10/06/21 ?0327 10/07/21 ?0124 10/08/21 ?0112  ?WBC 21.3* 24.3* 19.3*  ?RBC 3.47* 3.53* 3.45*  ?HGB 11.1* 11.2* 11.0*  ?HCT 36.5 36.1 34.9*  ?MCV 105.2* 102.3* 101.2*  ?MCH 32.0 31.7 31.9  ?MCHC 30.4 31.0 31.5  ?RDW 14.6 14.3 14.2  ?PLT 101* 102* 91*  ? ? ?Radiology  ?  ?DG Chest Port 1 View ? ?Result Date: 10/07/2021 ?CLINICAL DATA:  Acute respiratory failure in a 66 year old female. EXAM: PORTABLE CHEST 1 VIEW COMPARISON:  Comparison made with October 03, 2021. FINDINGS: Endotracheal tube terminates approximately 2.7 cm from the carina similar to prior imaging. Feeding tube courses through in off the field of the  radiograph, terminating below the diaphragm. EKG leads project over the patient's chest. Image rotated to the RIGHT. Accounting for this cardiomediastinal contours and hilar structures are stable. Signs of pulmonary emphysema are noted. No lobar consolidation. Lungs are hyperinflated. No gross effusion. On limited assessment there is no acute skeletal process. IMPRESSION: 1. No interval change in the appearance of the chest. 2. COPD. Electronically Signed   By: Zetta Bills M.D.   On: 10/07/2021 09:14   ? ? ?Patient Profile  ?   ?66 y.o. 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 Carles Collet.  Echocardiogram this admission shows ejection fraction 40% with anteroseptal and apical akinesis, grade 1 diastolic dysfunction. ? ?Assessment & Plan  ?  ?1 acute combined systolic/diastolic congestive heart failure-ejection fraction decreased as outlined above with wall motion abnormalities.  She will ultimately require ischemia evaluation when she improves from her COPD/pneumonia (will also need to make significant improvement from encephalopathy).  Will add aspirin 81 mg daily.  Liver functions are much improved.  Add Crestor 20 mg daily as she likely has ischemic heart disease.  Continue hydralazine/nitrates.  Once she is extubated we will plan transition to [facility].  We will also consider addition of [place] or Jardiance.  Presently on Lasix 40 mg daily.  We will continue. ? ?2 COPD/pneumonia-managed by critical care medicine. ? ?3 acute metabolic encephalopathy ? ?For questions or updates, please contact [place] ?Please consult [email or web address] for contact info under  ? ?  ?   ?Signed, ?Kirk Ruths, MD  ?10/08/2021, 8:02 AM   ? ?

## 2021-10-08 NOTE — Procedures (Signed)
Diagnostic Bronchoscopy ? ?[name]  ?[id number]  ?[birth year] ? ?Date:10/08/21  ?Time:1:34 PM  ? ?Provider Performing:  Dorrene German ? ?Procedure: Diagnostic Bronchoscopy (71062) ? ?Indication(s) ?Assist with direct visualization of tracheostomy placement ? ?Consent ?Risks of the procedure as well as the alternatives and risks of each were explained to the patient and/or caregiver.  Consent for the procedure was obtained. ? ? ?Anesthesia ?See separate tracheostomy note ? ? ?Time Out ?Verified patient identification, verified procedure, site/side was marked, verified correct patient position, special equipment/implants available, medications/allergies/relevant history reviewed, required imaging and test results available. ? ? ?Sterile Technique ?Usual hand hygiene, masks, gowns, and gloves were used ? ? ?Procedure Description ?Bronchoscope advanced through endotracheal tube and into airway.  After suctioning out tracheal secretions, bronchoscope used to provide direct visualization of tracheostomy placement. ? ? ?Complications/Tolerance ?None; patient tolerated the procedure well. ? ? ?EBL ?None ? ?Specimen(s) ?None  ?

## 2021-10-08 NOTE — Progress Notes (Signed)
SLP Cancellation Note ? ?Patient Details ?Name: [name] ?MRN: [id number] ?DOB: 01-24-1956 ? ? ?Cancelled treatment:       Reason Eval/Treat Not Completed: Other (comment) Patient with new tracheostomy today. Orders for SLP eval and treat for PMSV and swallowing received. Will follow pt closely for readiness for SLP interventions as appropriate.   ? ? ? ?Mahala Menghini., M.A. CCC-SLP ?Acute Rehabilitation Services ?Office [phone] ? ?Secure chat preferred ? ?10/08/2021, 3:29 PM ?

## 2021-10-08 NOTE — Progress Notes (Signed)
? ?NAME:  [name], MRN:  [id number], DOB:  1955-07-06, LOS: 3 ?ADMISSION DATE:  09/24/2021, CONSULTATION DATE:  4/18 ?REFERRING MD:  Tat/ triad, CHIEF COMPLAINT:  resp distress   ? ?History of Present Illness:  ?66 y.o. female quit smoking 2021 with  GOLD 3 COPD MZ, uncontrolled anxiety. Was on 02 one year PTA but "took herself off" per friend at bedside and able to care for pets at home but mostly housebound sinc ethen ? ?She was admitted 4/17 to [facility], ICU with acute hypoxic/hypercarbic respiratory failure, failed BiPAP and required mechanical ventilation, transferred to [facility] 4/21 due to new finding of LV dysfunction ? ? ? ?Significant Hospital Events: ?Including procedures, antibiotic start and stop dates in addition to other pertinent events   ?ET  4/18 c/b hypotension with severe air trapping  ?Echo 4/20 with EF 40% , akinesis of apex and septal wall ?4/24 CTA head negative for any significant abnormality ?4/25 ketamine added ?4/28 increased work of breathing during the wean, severe auto PEEP ?4/29 Febrile 101.2 ? ?Interim History / Subjective:  ? ?No acute issues overnight.  ? ?Objective   ?Blood pressure (!) 160/64, pulse 96, temperature 99.7 ?F (37.6 ?C), temperature source Axillary, resp. rate (!) 29, height 5\' 1"  (1.549 m), weight 55.9 kg, SpO2 93 %. ?   ?Vent Mode: PRVC ?FiO2 (%):  [40 %] 40 % ?Set Rate:  [20 bmp] 20 bmp ?Vt Set:  [430 mL] 430 mL ?PEEP:  [5 cmH20] 5 cmH20 ?Plateau Pressure:  [15 cmH20-20 cmH20] 19 cmH20  ? ?Intake/Output Summary (Last 24 hours) at 10/08/2021 0856 ?Last data filed at 10/08/2021 0700 ?Gross per 24 hour  ?Intake 3391.9 ml  ?Output 1050 ml  ?Net 2341.9 ml  ? ?Filed Weights  ? 10/06/21 0500 10/07/21 0500 10/08/21 0347  ?Weight: 52.5 kg 55.4 kg 55.9 kg  ? ? ?Examination: ?General appearance: 66 y.o., female, intubated and sedated ?Eyes: PERRL, tracking appropriately ?HENT: NCAT; MMM ?Neck: Trachea midline; no lymphadenopathy, no JVD ?Lungs: diminished bilaterally, equal  chest rise ?CV: RRR, no murmur  ?Abdomen: Soft, non-tender; non-distended, BS present  ?Extremities: trace dependent edema, warm ?Skin: Normal turgor and texture; no rash ?Neuro: RASS -1, follows commands ? ? ?Labs/imaging reviewed  ? ? ?Assessment & Plan:  ? ?Acute hypoxemic/hypercapnic respiratory failure ?Persistent tachypnea with ineffective trigger and auto PEEP in spite of adjusting vent, does not seem to be a sedation issue ?-Target TVol 6-8cc/kgIBW ?-Target Plateau Pressure < 30cm H20 ?-Ventilator associated pneumonia prevention protocol ?-Does not tolerate spontaneous breathing, plan for tracheostomy likely this afternoon ? ?HAP -bibasilar pneumonia , completed azithromycin course. Respiratory culture 4/27 showing Klebsiella ?-Simplify antibiotics from cefepime to ceftriaxone, plan for 7 days total ? ?Acute metabolic encephalopathy ?Severe anxiety ?-Continue Precedex, dc ketamine , use fentanyl intermittently only ?-Goal RASS 0 to -1 ?-On Seroquel 100 twice daily ?-Continue Klonopin ? ?Acute exacerbation of COPD ?Gold stage 4 COPD -FEV1 baseline 35% ?-Continue Brovana, Yupelri, Pulmicort ?-Continue 40 solumedrol  daily ? ?Acute systolic heart failure ?-New drop in EF of 40% with septal apical akinesis concerning for ischemia ?-Isordil and hydralazine per cardiology, increase hydralazine to 50 3 times daily ?-Low-dose metoprolol ordered , okay to titrate up since no bronchospasm ?-Okay to add ACE/ARB since blood pressure high ?-will need ischemic work-up at some point ?-Aspirin on hold due to low platelets ?-lasix 40 daily ? ?Hypernatremia -resolved  ?-Decrease free water to 100 every 4  ?-Check bmet daily ? ?Thrombocytopenia ?-Platelets low but  stable ? ?Goals of care ?-Dr. Elsworth Soho discussed tracheostomy in detail with sister 4/29-planned tentatively today ?-Sister will update patient's spouse -he is struggling after recent hospital admission and new diagnosis of lung cancer ?-Sister understands that she will  likely need facility care and high chance of vent dependence ? ? ?Best Practice (right click and "Reselect all SmartList Selections" daily)  ? ?Diet/type: tubefeeds - hold for likely trach this afternoon ?DVT prophylaxis: LMWH hold for likely trach this afternoon ?GI prophylaxis: PPI ?Lines: N/A ?Foley:  N/A ?Code Status:  full code ?Last date of multidisciplinary goals of care discussion [name] update family later today] ? ? ?The patient is critically ill with multiple organ systems failure and requires high complexity decision making for assessment and support, frequent evaluation and titration of therapies, application of advanced monitoring technologies and extensive interpretation of multiple databases. Critical Care Time devoted to patient care services described in this note independent of APP/resident time (if applicable)  is 37 minutes.  ? ?Walker Shadow  ?[place] ? ? ?If no response to pager , please call [phone] until 7 pm ?After 7:00 pm call Elink  [phone]  ? ?10/08/2021 ? ?

## 2021-10-08 NOTE — Procedures (Signed)
Percutaneous Tracheostomy Procedure Note ? ? ?[name]  ?[id number]  ?[birth year] ? ?Date:10/08/21  ?Time:1:36 PM  ? ?Provider Performing: Felisa Bonier, MD ? ?Procedure: Percutaneous Tracheostomy with Bronchoscopic Guidance (10071) ? ?Indication(s) ?COPD, failure to wean off ventilator  ? ?Consent ?Risks of the procedure as well as the alternatives and risks of each were explained to the patient and/or caregiver.  Consent for the procedure was obtained. ? ?Anesthesia ?Etomidate, Versed, Fentanyl, Vecuronium ? ? ?Time Out ?Verified patient identification, verified procedure, site/side was marked, verified correct patient position, special equipment/implants available, medications/allergies/relevant history reviewed, required imaging and test results available. ? ? ?Sterile Technique ?Maximal sterile technique including sterile barrier drape, hand hygiene, sterile gown, sterile gloves, mask, hair covering. ? ? ? ?Procedure Description ?Appropriate anatomy identified by palpation.  Patient's neck prepped and draped in sterile fashion.  1% lidocaine with epinephrine was used to anesthetize skin overlying neck.  1.5cm incision made and blunt dissection performed until tracheal rings could be easily palpated.   Then a size 6 Shiley tracheostomy was placed under bronchoscopic visualization using usual Seldinger technique and serial dilation.   Bronchoscope confirmed placement above the carina.  Tracheostomy was sutured in place with adhesive pad to protect skin under pressure.    Patient connected to ventilator. ? ? ?Complications/Tolerance ?None; patient tolerated the procedure well. ?Chest X-ray is ordered to confirm no post-procedural complication. ? ? ?EBL ?Minimal ? ? ?Specimen(s) ?None   ?

## 2021-10-09 ENCOUNTER — Encounter (HOSPITAL_COMMUNITY): Payer: Medicare Other

## 2021-10-09 DIAGNOSIS — J9601 Acute respiratory failure with hypoxia: Secondary | ICD-10-CM | POA: Diagnosis not present

## 2021-10-09 DIAGNOSIS — I5022 Chronic systolic (congestive) heart failure: Secondary | ICD-10-CM | POA: Diagnosis not present

## 2021-10-09 DIAGNOSIS — J9602 Acute respiratory failure with hypercapnia: Secondary | ICD-10-CM | POA: Diagnosis not present

## 2021-10-09 LAB — CBC
HCT: 34.7 % — ABNORMAL LOW (ref 36.0–46.0)
Hemoglobin: 11 g/dL — ABNORMAL LOW (ref 12.0–15.0)
MCH: 32.1 pg (ref 26.0–34.0)
MCHC: 31.7 g/dL (ref 30.0–36.0)
MCV: 101.2 fL — ABNORMAL HIGH (ref 80.0–100.0)
Platelets: 93 10*3/uL — ABNORMAL LOW (ref 150–400)
RBC: 3.43 MIL/uL — ABNORMAL LOW (ref 3.87–5.11)
RDW: 13.9 % (ref 11.5–15.5)
WBC: 23 10*3/uL — ABNORMAL HIGH (ref 4.0–10.5)
nRBC: 0 % (ref 0.0–0.2)

## 2021-10-09 LAB — BASIC METABOLIC PANEL
Anion gap: 10 (ref 5–15)
BUN: 47 mg/dL — ABNORMAL HIGH (ref 8–23)
CO2: 33 mmol/L — ABNORMAL HIGH (ref 22–32)
Calcium: 8.4 mg/dL — ABNORMAL LOW (ref 8.9–10.3)
Chloride: 101 mmol/L (ref 98–111)
Creatinine, Ser: 0.48 mg/dL (ref 0.44–1.00)
GFR, Estimated: >60 mL/min (ref >60)
Glucose, Bld: 126 mg/dL — ABNORMAL HIGH (ref 70–99)
Potassium: 4.1 mmol/L (ref 3.5–5.1)
Sodium: 144 mmol/L (ref 135–145)

## 2021-10-09 LAB — GLUCOSE, CAPILLARY
Glucose-Capillary: 102 mg/dL — ABNORMAL HIGH (ref 70–99)
Glucose-Capillary: 105 mg/dL — ABNORMAL HIGH (ref 70–99)
Glucose-Capillary: 140 mg/dL — ABNORMAL HIGH (ref 70–99)
Glucose-Capillary: 145 mg/dL — ABNORMAL HIGH (ref 70–99)
Glucose-Capillary: 156 mg/dL — ABNORMAL HIGH (ref 70–99)
Glucose-Capillary: 157 mg/dL — ABNORMAL HIGH (ref 70–99)
Glucose-Capillary: 164 mg/dL — ABNORMAL HIGH (ref 70–99)
Glucose-Capillary: 165 mg/dL — ABNORMAL HIGH (ref 70–99)

## 2021-10-09 MED ORDER — METOPROLOL TARTRATE 12.5 MG HALF TABLET
12.5000 mg | ORAL_TABLET | Freq: Two times a day (BID) | ORAL | Status: DC
  Administered 2021-10-09 – 2021-10-13 (×10): 12.5 mg
  Filled 2021-10-09 (×10): qty 1

## 2021-10-09 MED ORDER — PREDNISONE 20 MG PO TABS
30.0000 mg | ORAL_TABLET | Freq: Every day | ORAL | Status: AC
  Administered 2021-10-10: 30 mg
  Filled 2021-10-09: qty 1

## 2021-10-09 MED ORDER — OXYCODONE HCL 5 MG PO TABS
10.0000 mg | ORAL_TABLET | Freq: Four times a day (QID) | ORAL | Status: DC
  Administered 2021-10-09: 10 mg via ORAL
  Filled 2021-10-09 (×2): qty 2

## 2021-10-09 MED ORDER — PREDNISONE 20 MG PO TABS
20.0000 mg | ORAL_TABLET | Freq: Every day | ORAL | Status: AC
  Administered 2021-10-11: 20 mg
  Filled 2021-10-09: qty 1

## 2021-10-09 MED ORDER — OXYCODONE HCL 5 MG PO TABS
10.0000 mg | ORAL_TABLET | Freq: Four times a day (QID) | ORAL | Status: DC
  Administered 2021-10-09 – 2021-10-12 (×11): 10 mg
  Filled 2021-10-09 (×11): qty 2

## 2021-10-09 MED ORDER — ENOXAPARIN SODIUM 40 MG/0.4ML IJ SOSY
40.0000 mg | PREFILLED_SYRINGE | INTRAMUSCULAR | Status: AC
  Administered 2021-10-09 – 2021-10-31 (×23): 40 mg via SUBCUTANEOUS
  Filled 2021-10-09 (×22): qty 0.4

## 2021-10-09 MED ORDER — PREDNISONE 10 MG PO TABS
10.0000 mg | ORAL_TABLET | Freq: Every day | ORAL | Status: AC
  Administered 2021-10-12: 10 mg
  Filled 2021-10-09: qty 1

## 2021-10-09 MED ORDER — PREDNISONE 20 MG PO TABS
40.0000 mg | ORAL_TABLET | Freq: Every day | ORAL | Status: AC
  Administered 2021-10-09: 40 mg
  Filled 2021-10-09: qty 2

## 2021-10-09 NOTE — Evaluation (Signed)
Physical Therapy Evaluation ?Patient Details ?Name: [name] ?MRN: [id number] ?DOB: March 04, 1956 ?Today's Date: 10/09/2021 ? ?History of Present Illness ? 66 y/o F reports diagnosis of PNA on 4/16 Admitted 4/17 to [facility], ICU with acute hypoxic/hypercarbic respiratory failure, failed BiPAP and required mechanical ventilation, transferred to [facility] 4/21 due to new finding of LV dysfunction Echo 4/20 with EF 40% , akinesis of apex and septal wall. . Failed weaning attempt 4/28. PMH significant for COPD, tobacco use, HLD, GRED, and uncontrolled anxiety.  ?Clinical Impression ? Pt remains intubated and no family present to collect home set up and PLOF. Pt limited in safe mobility by ventilator and associated increased anxiety with getting enough oxygen, in presence of generalized weakness, decreased balance and decreased endurance. Pt is currently total A for bed mobility, tolerated sitting EoB ~8 min prior to increased fatigue. PT recommending SNF level rehab pending weaning from ventilator. PT will continue to follow him acutely. ?   ? ?Recommendations for follow up therapy are one component of a multi-disciplinary discharge planning process, led by the attending physician.  Recommendations may be updated based on patient status, additional functional criteria and insurance authorization. ? ?Follow Up Recommendations Skilled nursing-short term rehab (<3 hours/day) ? ?  ?Assistance Recommended at Discharge Frequent or constant Supervision/Assistance  ?Patient can return home with the following ? A lot of help with walking and/or transfers;A lot of help with bathing/dressing/bathroom;Assistance with cooking/housework;Assistance with feeding;Direct supervision/assist for medications management;Direct supervision/assist for financial management;Assist for transportation;Help with stairs or ramp for entrance ? ?  ?Equipment Recommendations Rolling walker (2 wheels);BSC/3in1;Wheelchair (measurements PT);Wheelchair cushion  (measurements PT)  ?Recommendations for Other Services ?    ?  ?Functional Status Assessment Patient has had a recent decline in their functional status and demonstrates the ability to make significant improvements in function in a reasonable and predictable amount of time.  ? ?  ?Precautions / Restrictions Precautions ?Precautions: Fall ?Precaution Comments: intubated, NG tube ?Restrictions ?Weight Bearing Restrictions: No  ? ?  ? ?Mobility ? Bed Mobility ?Overal bed mobility: Needs Assistance ?Bed Mobility: Supine to Sit, Sit to Supine, Rolling ?Rolling: Total assist ?  ?Supine to sit: HOB elevated, Total assist, +2 for physical assistance ?Sit to supine: Total assist, HOB elevated, +2 for physical assistance ?  ?General bed mobility comments: Total A for rolling R and L for pericare, requires total A to come to EoB and return to supine ?  ? ?Transfers ?  ?  ?  ?  ?  ?  ?  ?  ?  ?General transfer comment: unable due to weakness ?  ? ?  ? ?  ? ?Balance Overall balance assessment: Needs assistance ?Sitting-balance support: Feet unsupported, Bilateral upper extremity supported ?Sitting balance-Leahy Scale: Zero ?Sitting balance - Comments: pt requires total A posteriorly to maintain balance, able to transition from total support to partial support with fatigue transitions back to total support ?Postural control: Posterior lean ?  ?  ?  ?  ?  ?  ?  ?  ?  ?  ?  ?  ?  ?  ?   ? ? ? ?Pertinent Vitals/Pain Pain Assessment ?Pain Assessment: Faces ?Faces Pain Scale: Hurts little more ?Pain Location: generalized with movement ?Pain Descriptors / Indicators: Grimacing, Guarding ?Pain Intervention(s): Limited activity within patient's tolerance, Monitored during session, Repositioned  ? ? ?Home Living Family/patient expects to be discharged to:: Private residence ?Living Arrangements: Spouse/significant other ?  ?  ?  ?  ?  ?  ?  ?  ?   ?  ?  Prior Function  Pt unable to provide  ?  ?  ?  ?  ?  ?  ?  ?  ?  ? ? ?    ?Extremity/Trunk Assessment  ? Upper Extremity Assessment ?Upper Extremity Assessment: Defer to OT evaluation ?  ? ?Lower Extremity Assessment ?Lower Extremity Assessment: RLE deficits/detail;LLE deficits/detail ?RLE Deficits / Details: following command able to move hip, knee and ankle within limited active range, very stiff, limited by ?RLE Coordination: decreased fine motor ?LLE Deficits / Details: following command able to move hip, knee and ankle within limited active range, very stiff ?LLE Coordination: decreased fine motor ?  ? ?Cervical / Trunk Assessment ?Cervical / Trunk Assessment: Normal  ?Communication  ? Communication: Tracheostomy;Other (comment) (constantly mouths desire for water)  ?Cognition Arousal/Alertness: Awake/alert ?Behavior During Therapy: Anxious ?Overall Cognitive Status: Difficult to assess ?  ?  ?  ?  ?  ?  ?  ?  ?  ?  ?  ?  ?  ?  ?  ?  ?General Comments: tried to have pt write however illegible ?  ?  ? ?  ?General Comments General comments (skin integrity, edema, etc.): O2 40%O2  PEEP 5 RR 20-30 bpm, BP supine 130/71, sitting 117/91, return to supine 127/76 ? ?  ?   ? ?Assessment/Plan  ?  ?PT Assessment Patient needs continued PT services  ?PT Problem List Decreased strength;Decreased range of motion;Decreased activity tolerance;Decreased balance;Decreased mobility;Decreased coordination;Cardiopulmonary status limiting activity;Pain ? ?   ?  ?PT Treatment Interventions DME instruction;Gait training;Functional mobility training;Therapeutic activities;Therapeutic exercise;Balance training;Cognitive remediation;Patient/family education   ? ?PT Goals (Current goals can be found in the Care Plan section)  ?Acute Rehab PT Goals ?Patient Stated Goal: none stated ?PT Goal Formulation: With patient ?Time For Goal Achievement: 10/23/21 ?Potential to Achieve Goals: Fair ? ?  ?Frequency Min 3X/week ?  ? ? ?   ?AM-PAC PT "6 Clicks" Mobility  ?Outcome Measure Help needed turning from your back to  your side while in a flat bed without using bedrails?: Total ?Help needed moving from lying on your back to sitting on the side of a flat bed without using bedrails?: Total ?Help needed moving to and from a bed to a chair (including a wheelchair)?: Total ?Help needed standing up from a chair using your arms (e.g., wheelchair or bedside chair)?: Total ?Help needed to walk in hospital room?: Total ?Help needed climbing 3-5 steps with a railing? : Total ?6 Click Score: 6 ? ?  ?End of Session Equipment Utilized During Treatment: Oxygen ?Activity Tolerance: Patient tolerated treatment well ?Patient left: in bed;with call bell/phone within reach;with bed alarm set;with restraints reapplied ?Nurse Communication: Mobility status ?PT Visit Diagnosis: Unsteadiness on feet (R26.81);Other abnormalities of gait and mobility (R26.89);Muscle weakness (generalized) (M62.81);Difficulty in walking, not elsewhere classified (R26.2);Pain ?Pain - part of body:  (generalized) ?  ? ?Time: 1749-4496 ?PT Time Calculation (min) (ACUTE ONLY): 57 min ? ? ?Charges:   PT Evaluation ?$PT Eval High Complexity: 1 High ?PT Treatments ?$Therapeutic Activity: 38-52 mins ?  ?   ? ? ?Vicky Schleich B. Beverely Risen PT, DPT ?Acute Rehabilitation Services ?Please use secure chat or  ?Call Office [phone] ? ? ?Elon Alas Fleet ?10/09/2021, 3:14 PM ? ?

## 2021-10-09 NOTE — Progress Notes (Signed)
SLP Cancellation Note ? ?Patient Details ?Name: [name] ?MRN: [id number] ?DOB: 1956/02/23 ? ? ?Cancelled treatment:       Reason Eval/Treat Not Completed: Medical issues which prohibited therapy;Other (comment) (patient remains on vent. SLP will f/u next date for readiness) ? ?Angela Nevin, MA, CCC-SLP ?Speech Therapy ? ?

## 2021-10-09 NOTE — Progress Notes (Signed)
? ?NAME:  [name], MRN:  [id number], DOB:  November 14, 1955, LOS: 82 ?ADMISSION DATE:  09/24/2021, CONSULTATION DATE:  4/18 ?REFERRING MD:  Tat/ triad, CHIEF COMPLAINT:  resp distress   ? ?History of Present Illness:  ?66 y.o. female quit smoking 2021 with  GOLD 3 COPD MZ, uncontrolled anxiety. Was on 02 one year PTA but "took herself off" per friend at bedside and able to care for pets at home but mostly housebound sinc ethen ? ?She was admitted 4/17 to [facility], ICU with acute hypoxic/hypercarbic respiratory failure, failed BiPAP and required mechanical ventilation, transferred to [facility] 4/21 due to new finding of LV dysfunction ? ? ? ?Significant Hospital Events: ?Including procedures, antibiotic start and stop dates in addition to other pertinent events   ?ET  4/18 c/b hypotension with severe air trapping  ?Echo 4/20 with EF 40% , akinesis of apex and septal wall ?4/24 CTA head negative for any significant abnormality ?4/25 ketamine added ?4/28 increased work of breathing during the wean, severe auto PEEP ?4/29 Febrile 101.2 ? ?Interim History / Subjective:  ? ?Perc trach yesterday, sedation gradually weaned afterward. Tolerated weaning much better today for about an hour limited by tachypnea - hard to tell how much is due to anxiety vs weakness but regardless when tachypneic develops some auto-peep/air trapping. ? ?Objective   ?Blood pressure 122/77, pulse 64, temperature (!) 97.5 ?F (36.4 ?C), temperature source Oral, resp. rate (!) 21, height 5\' 1"  (1.549 m), weight 55.2 kg, SpO2 99 %. ?   ?Vent Mode: PSV;CPAP ?FiO2 (%):  [40 %] 40 % ?Set Rate:  [20 bmp] 20 bmp ?Vt Set:  [430 mL] 430 mL ?PEEP:  [5 cmH20] 5 cmH20 ?Pressure Support:  [10 cmH20] 10 cmH20 ?Plateau Pressure:  [10 [id number] cmH20] 20 cmH20  ? ?Intake/Output Summary (Last 24 hours) at 10/09/2021 1001 ?Last data filed at 10/09/2021 0600 ?Gross per 24 hour  ?Intake 1851.32 ml  ?Output 1150 ml  ?Net 701.32 ml  ? ?Filed Weights  ? 10/07/21 0500 10/08/21 0347  10/09/21 0500  ?Weight: 55.4 kg 55.9 kg 55.2 kg  ? ? ?Examination: ?General appearance: 66 y.o., female, sedated ?Eyes: PERRL, tracking appropriately ?HENT: NCAT; MMM ?Neck: Trachea midline; no lymphadenopathy, no JVD, 6-0 shiley flex in place ?Lungs: diminished bilaterally, equal chest rise ?CV: tachy RR, no murmur  ?Abdomen: Soft, non-tender; non-distended, BS present  ?Extremities: trace dependent edema, warm ?Skin: Normal turgor and texture; no rash ?Neuro: RASS -1, follows commands ? ? ?BMP, CBC stable ? ?CXR reviewed - basilar opacities more prominent than prior, trach in place ? ? ?Assessment & Plan:  ? ?Acute hypoxemic/hypercapnic respiratory failure ?Tracheostomy with vent dependent respiratory failure ?HAP klebsiella pneumonia ?Resolving acute exacerbation of COPD ?S/p trach 5/1.  ?-lung protective ventilation ?-Daily wean trial ?-start oxycodone per tube, wean fentanyl infusion for RASS 0 to -1 ?-precedex for RASS 0 to -1 ?-bronchodilators ?-start 4 day steroid taper since she's been on steroids >2 weeks ?-7 day ceftriaxone course  ? ?Acute metabolic encephalopathy ?Severe anxiety ?-addition of oxycodone per tube, wean fentanyl, precedex as above ?-Goal RASS 0 to -1 ?-On Seroquel 100 twice daily ?-Continue Klonopin ? ?Acute systolic heart failure ?-New drop in EF of 40% with septal apical akinesis concerning for ischemia ?-H/I on hold with softer pressures, could later add as sedation lifted ?-Low-dose metoprolol ordered , okay to titrate up since no bronchospasm ?-will need ischemic work-up at some point ?-Aspirin on hold due to low platelets ?-holding diuretic ? ?  Hypernatremia -resolved  ?-Decrease free water to 100 every 4  ?-Check bmet daily ? ?Thrombocytopenia ?-Platelets low but stable ? ?Goals of care ?-Will need to discuss g-tube as it's looking likely she'll need long course on the vent ?-Sister will update patient's spouse -he is struggling after recent hospital admission and new diagnosis of  lung cancer ?-Sister understands that she will likely need facility care and high chance of vent dependence ?-LTAC consult placed ? ? ?Best Practice (right click and "Reselect all SmartList Selections" daily)  ? ?Diet/type: tubefeeds  ?DVT prophylaxis: LMWH  ?GI prophylaxis: PPI ?Lines: N/A ?Foley:  N/A ?Code Status:  full code ?Last date of multidisciplinary goals of care discussion [name] update family later today] ? ? ?The patient is critically ill with multiple organ systems failure and requires high complexity decision making for assessment and support, frequent evaluation and titration of therapies, application of advanced monitoring technologies and extensive interpretation of multiple databases. Critical Care Time devoted to patient care services described in this note independent of APP/resident time (if applicable)  is 32 minutes.  ? ?[name]  ?[name] ? ? ?If no response to pager , please call [phone] until 7 pm ?After 7:00 pm call Elink  [phone]  ? ?10/09/2021 ? ?

## 2021-10-09 NOTE — Progress Notes (Signed)
? ?Progress Note ? ?Patient Name: TANGEE MARSZALEK ?Date of Encounter: 10/09/2021 ? ?[facility] Cardiologist: Dorris Carnes, MD  ? ?Subjective  ? ?Alert; nods head no to CP or dyspnea ? ?Inpatient Medications  ?  ?Scheduled Meds: ? arformoterol  15 mcg Nebulization BID  ? aspirin  81 mg Per NG tube Daily  ? budesonide (PULMICORT) nebulizer solution  0.5 mg Nebulization BID  ? chlorhexidine gluconate (MEDLINE KIT)  15 mL Mouth Rinse BID  ? Chlorhexidine Gluconate Cloth  6 each Topical Q0600  ? clonazePAM  1 mg Per Tube BID  ? docusate  100 mg Per Tube BID  ? feeding supplement (PROSource TF)  45 mL Per Tube Daily  ? fentaNYL (SUBLIMAZE) injection  200 mcg Intravenous Once  ? fentaNYL (SUBLIMAZE) injection  50 mcg Intravenous Once  ? free water  100 mL Per Tube Q4H  ? furosemide  40 mg Intravenous Daily  ? hydrALAZINE  25 mg Per Tube Q8H  ? insulin aspart  0-20 Units Subcutaneous Q4H  ? isosorbide dinitrate  20 mg Per Tube TID  ? mouth rinse  15 mL Mouth Rinse 10 times per day  ? methylPREDNISolone (SOLU-MEDROL) injection  40 mg Intravenous Daily  ? metoprolol tartrate  50 mg Per Tube BID  ? montelukast  10 mg Per Tube q AM  ? pantoprazole sodium  40 mg Per Tube Daily  ? polyethylene glycol  17 g Per Tube Daily  ? QUEtiapine  100 mg Per Tube BID  ? revefenacin  175 mcg Nebulization Daily  ? rosuvastatin  20 mg Per NG tube Daily  ? ?Continuous Infusions: ? sodium chloride 10 mL/hr at 10/09/21 0600  ? sodium chloride    ? dexmedetomidine (PRECEDEX) IV infusion 1.1 mcg/kg/hr (10/09/21 0600)  ? feeding supplement (OSMOLITE 1.2 CAL) 55 mL/hr at 10/05/21 0730  ? fentaNYL infusion INTRAVENOUS 100 mcg/hr (10/09/21 0600)  ? norepinephrine (LEVOPHED) Adult infusion 2 mcg/min (10/09/21 0500)  ? ?PRN Meds: ?sodium chloride, acetaminophen **OR** acetaminophen, docusate, fentaNYL, fentaNYL, guaiFENesin-dextromethorphan, levalbuterol, polyethylene glycol, polyethylene glycol, senna, white petrolatum  ? ?Vital Signs  ?  ?Vitals:  ?  10/09/21 0800 10/09/21 0802 10/09/21 0804 10/09/21 0805  ?BP:    122/77  ?Pulse:    64  ?Resp:    (!) 21  ?Temp:      ?TempSrc:      ?SpO2: 99% 98% 99% 99%  ?Weight:      ?Height:      ? ? ?Intake/Output Summary (Last 24 hours) at 10/09/2021 0810 ?Last data filed at 10/09/2021 0600 ?Gross per 24 hour  ?Intake 1895.55 ml  ?Output 1150 ml  ?Net 745.55 ml  ? ? ? ?  10/09/2021  ?  5:00 AM 10/08/2021  ?  3:47 AM 10/07/2021  ?  5:00 AM  ?Last 3 Weights  ?Weight (lbs) 121 lb 11.1 oz 123 lb 3.8 oz 122 lb 2.2 oz  ?Weight (kg) 55.2 kg 55.9 kg 55.4 kg  ?   ? ?Telemetry  ?  ?Sinus with PAT - Personally Reviewed ? ?Physical Exam  ? ?GEN: WD  ?Neck: supple, s/p trach ?Cardiac: RRR, no gallop ?Respiratory: CTA ?GI: Soft, NT/ND ?MS: No edema ?Neuro:  Moves all ext ? ?Labs  ?  ?High Sensitivity Troponin:   ?Recent Labs  ?Lab 09/24/21 ?1707 09/24/21 ?1931  ?TROPONINIHS 7 12  ? ?   ?Chemistry ?Recent Labs  ?Lab 10/03/21 ?0155 10/03/21 ?0225 10/06/21 ?0327 10/07/21 ?0124 10/08/21 ?0112 10/09/21 ?0154  ?NA  146*   < > 150* 144 143 144  ?K 4.8   < > 4.3 4.2 4.1 4.1  ?CL 100   < > 110 103 102 101  ?CO2 40*   < > 35* 35* 33* 33*  ?GLUCOSE 196*   < > 168* 199* 113* 126*  ?BUN 36*   < > 50* 44* 42* 47*  ?CREATININE 0.64   < > 0.65 0.60 0.43* 0.48  ?CALCIUM 8.8*   < > 8.7* 8.4* 8.3* 8.4*  ?MG  --    < > 2.5* 2.3 2.3  --   ?PROT 6.1*  --  5.7*  --   --   --   ?ALBUMIN 3.0*  --  2.6*  --   --   --   ?AST 58*  --  48*  --   --   --   ?ALT 339*  --  182*  --   --   --   ?ALKPHOS 135*  --  111  --   --   --   ?BILITOT 0.5  --  0.7  --   --   --   ?GFRNONAA >60   < > >60 >60 >60 >60  ?ANIONGAP 6   < > '5 6 8 10  ' ? < > = values in this interval not displayed.  ? ?  ? ?Hematology ?Recent Labs  ?Lab 10/07/21 ?0124 10/08/21 ?0112 10/09/21 ?0154  ?WBC 24.3* 19.3* 23.0*  ?RBC 3.53* 3.45* 3.43*  ?HGB 11.2* 11.0* 11.0*  ?HCT 36.1 34.9* 34.7*  ?MCV 102.3* 101.2* 101.2*  ?MCH 31.7 31.9 32.1  ?MCHC 31.0 31.5 31.7  ?RDW 14.3 14.2 13.9  ?PLT 102* 91* 93*  ? ? ? ?Radiology   ?  ?DG Chest Port 1 View ? ?Result Date: 10/08/2021 ?CLINICAL DATA:  Post tracheostomy EXAM: PORTABLE CHEST 1 VIEW COMPARISON:  Chest radiograph 10/07/2021 FINDINGS: There is a tracheostomy tube in place with tip approximately 4.5 cm from the carina. The enteric catheter tip is off the field of view. The cardiomediastinal silhouette is stable. Aeration of the lungs is unchanged, with no new or worsening focal airspace disease. The lungs remain hyperinflated consistent with underlying COPD. There is no pleural effusion or pneumothorax. There is no acute osseous abnormality. IMPRESSION: 1. New tracheostomy tube in place with tip approximately 4.5 cm from the carina. 2. Unchanged aeration of the lungs since 10/07/2021. Electronically Signed   By: Valetta Mole M.D.   On: 10/08/2021 13:59   ? ? ?Patient Profile  ?   ?66 y.o. 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 Carles Collet.  Echocardiogram this admission shows ejection fraction 40% with anteroseptal and apical akinesis, grade 1 diastolic dysfunction. ? ?Assessment & Plan  ?  ?1 acute combined systolic/diastolic congestive heart failure-ejection fraction decreased as outlined above with wall motion abnormalities.  She will ultimately require ischemia evaluation if she makes significant improvement from a pulmonary standpoint and from her encephalopathy.  We will continue aspirin and statin.  Her blood pressure was low yesterday requiring pressors likely secondary to sedation she received at time of tracheostomy.  Some improvement today.  We will discontinue hydralazine/nitrates.  Continue beta-blocker.  We will add ARB later as blood pressure allows.  Discontinue Lasix as she appears to be euvolemic.  We will add [place] or Jardiance later.   ? ?2 COPD/pneumonia/VDRF-managed by critical  care medicine. ? ?3 acute metabolic encephalopathy ? ?For questions or updates,  please contact [place] ?Please consult [email or web address] for contact info under  ? ?  ?   ?Signed, ?Kirk Ruths, MD  ?10/09/2021, 8:10 AM   ? ?

## 2021-10-10 DIAGNOSIS — J9602 Acute respiratory failure with hypercapnia: Secondary | ICD-10-CM | POA: Diagnosis not present

## 2021-10-10 DIAGNOSIS — J9601 Acute respiratory failure with hypoxia: Secondary | ICD-10-CM | POA: Diagnosis not present

## 2021-10-10 LAB — CBC
HCT: 33.1 % — ABNORMAL LOW (ref 36.0–46.0)
Hemoglobin: 10.5 g/dL — ABNORMAL LOW (ref 12.0–15.0)
MCH: 32.4 pg (ref 26.0–34.0)
MCHC: 31.7 g/dL (ref 30.0–36.0)
MCV: 102.2 fL — ABNORMAL HIGH (ref 80.0–100.0)
Platelets: DECREASED 10*3/uL (ref 150–400)
RBC: 3.24 MIL/uL — ABNORMAL LOW (ref 3.87–5.11)
RDW: 14.3 % (ref 11.5–15.5)
WBC: 21.9 10*3/uL — ABNORMAL HIGH (ref 4.0–10.5)
nRBC: 0 % (ref 0.0–0.2)

## 2021-10-10 LAB — GLUCOSE, CAPILLARY
Glucose-Capillary: 131 mg/dL — ABNORMAL HIGH (ref 70–99)
Glucose-Capillary: 129 mg/dL — ABNORMAL HIGH (ref 70–99)
Glucose-Capillary: 119 mg/dL — ABNORMAL HIGH (ref 70–99)
Glucose-Capillary: 153 mg/dL — ABNORMAL HIGH (ref 70–99)
Glucose-Capillary: 135 mg/dL — ABNORMAL HIGH (ref 70–99)
Glucose-Capillary: 82 mg/dL (ref 70–99)

## 2021-10-10 LAB — BASIC METABOLIC PANEL
Anion gap: 7 (ref 5–15)
BUN: 41 mg/dL — ABNORMAL HIGH (ref 8–23)
CO2: 33 mmol/L — ABNORMAL HIGH (ref 22–32)
Calcium: 8.7 mg/dL — ABNORMAL LOW (ref 8.9–10.3)
Chloride: 106 mmol/L (ref 98–111)
Creatinine, Ser: 0.48 mg/dL (ref 0.44–1.00)
GFR, Estimated: >60 mL/min (ref >60)
Glucose, Bld: 120 mg/dL — ABNORMAL HIGH (ref 70–99)
Potassium: 5.5 mmol/L — ABNORMAL HIGH (ref 3.5–5.1)
Sodium: 146 mmol/L — ABNORMAL HIGH (ref 135–145)

## 2021-10-10 MED ORDER — FREE WATER
200.0000 mL | Status: DC
  Administered 2021-10-10 – 2021-10-19 (×54): 200 mL

## 2021-10-10 MED ORDER — SODIUM ZIRCONIUM CYCLOSILICATE 10 G PO PACK
10.0000 g | PACK | Freq: Once | ORAL | Status: AC
  Administered 2021-10-10: 10 g
  Filled 2021-10-10: qty 1

## 2021-10-10 NOTE — TOC Initial Note (Addendum)
Transition of Care (TOC) - Initial/Assessment Note  ? ? ?Patient Details  ?Name: [name] ?MRN: [id number] ?Date of Birth: [birth year] ? ?Transition of Care [facility]) CM/SW Contact:    ?Lorri Frederick, LCSW ?Phone Number: ?10/10/2021, 12:21 PM ? ?Clinical Narrative:  Pt continues to be intubated.  CSW spoke with pt husband [name] by phone, discussed potential for LTAC.  Husband not familiar with LTAC, CSW answered questions.  LTAC choice discussed, husband also not familiar with [facility].  No facility choice indicated by husband.  Pt has been vaccinated for covid with 2 boosters.    ? ?            ? CSW spoke with [facility], who reviewed pt and reports she is a candidate for LTAC. She will initiate insurance auth request.   ? ?1315: CSW received message from Emily[facility] and she also can offer bed.  She will reach out to pt husband and discuss [facility] option. ? ?CSw spoke with Jen/[facility], she will speak with husband as well.   ? ?Expected Discharge Plan: Long Term Acute Care (LTAC) ?Barriers to Discharge: Continued Medical Work up ? ? ?Patient Goals and CMS Choice ?  ?  ?Choice offered to / list presented to : Spouse ? ?Expected Discharge Plan and Services ?Expected Discharge Plan: Long Term Acute Care (LTAC) ?In-house Referral: Clinical Social Work ?  ?Post Acute Care Choice: Long Term Acute Care (LTAC) ?Living arrangements for the past 2 months: Single Family Home ?                ?  ?  ?  ?  ?  ?  ?  ?  ?  ?  ? ?Prior Living Arrangements/Services ?Living arrangements for the past 2 months: Single Family Home ?Lives with:: Spouse ?Patient language and need for interpreter reviewed:: No ?       ?Need for Family Participation in Patient Care: Yes (Comment) ?Care giver support system in place?: Yes (comment) ?Current home services: Other (comment) (none) ?Criminal Activity/Legal Involvement Pertinent to Current Situation/Hospitalization: No - Comment as needed ? ?Activities of Daily  Living ?Home Assistive Devices/Equipment: None ?ADL Screening (condition at time of admission) ?Patient's cognitive ability adequate to safely complete daily activities?: Yes ?Is the patient deaf or have difficulty hearing?: No ?Does the patient have difficulty seeing, even when wearing glasses/contacts?: No ?Does the patient have difficulty concentrating, remembering, or making decisions?: No ?Patient able to express need for assistance with ADLs?: Yes ?Does the patient have difficulty dressing or bathing?: No ?Independently performs ADLs?: Yes (appropriate for developmental age) ?Does the patient have difficulty walking or climbing stairs?: Yes ?Weakness of Legs: Both ?Weakness of Arms/Hands: None ? ?Permission Sought/Granted ?  ?  ?   ?   ?   ?   ? ?Emotional Assessment ?Appearance:: Appears stated age ?Attitude/Demeanor/Rapport: Unable to Assess ?Affect (typically observed): Unable to Assess ?Orientation: :  (intubated) ?Alcohol / Substance Use: Not Applicable ?Psych Involvement: No (comment) ? ?Admission diagnosis:  Acute respiratory distress [R06.03] ?COPD exacerbation (HCC) [J44.1] ?COPD with acute exacerbation (HCC) [J44.1] ?Acute exacerbation of COPD with asthma (HCC) [J44.1, J45.901] ?Patient Active Problem List  ? Diagnosis Date Noted  ? Acute encephalopathy   ? Protein-calorie malnutrition, severe 10/03/2021  ? Heart failure with mid-range ejection fraction (HFmEF) (HCC) 09/28/2021  ? Hypernatremia 09/27/2021  ? Acute respiratory failure with hypoxia and hypercapnia (HCC) 09/26/2021  ? Lactic acidosis 09/26/2021  ? GERD (gastroesophageal reflux disease) 09/25/2021  ?  Hyperglycemia 09/25/2021  ? Anxiety 09/24/2021  ? Physical deconditioning 02/06/2021  ? Stridor 12/28/2020  ? Shortness of breath 12/19/2020  ? Hypokalemia 05/01/2019  ? SBO (small bowel obstruction) (HCC) 04/28/2019  ? SIRS (systemic inflammatory response syndrome) (HCC) 04/28/2019  ? Tobacco abuse 04/28/2019  ? Chronic pain 04/28/2019  ?  AKI (acute kidney injury) (HCC) 04/28/2019  ? Acute respiratory failure (HCC) 08/17/2013  ? COPD exacerbation (HCC) 08/16/2013  ? ?PCP:  Marva Panda, NP ?Pharmacy:   ?[id number] [place], [place] - [address] ?[address] ?[place] [address] ?Phone: [phone] Fax: [phone] ? ?[facility] [phone] - [place], [place] - [address] ?[address] ?[place] [address] ?Phone: [phone] Fax: [phone] ? ?[id number] - [place], [place] - [address] ?[address] ?[place]  [address] ?Phone: [phone] Fax: [phone] ? ?[facility] - [place], [place] - [address] ?[address] ?[address] ?[place] [address] ?Phone: [phone] Fax: [phone] ? ? ? ? ?Social Determinants of Health (SDOH) Interventions ?  ? ?Readmission Risk Interventions ?   ? View : No data to display.  ?  ?  ?  ? ? ? ?

## 2021-10-10 NOTE — Progress Notes (Signed)
Nutrition Follow-up ? ?DOCUMENTATION CODES:  ? ?Severe malnutrition in context of chronic illness ? ?INTERVENTION:  ? ?Continue TF via Cortrak tube: ?Osmolite 1.2 at 55 ml/h (1320 ml per day) ?Prosource TF 45 ml once daily ? ?Provides 1624 kcal, 84 gm protein, 1082 ml free water daily. ? ?NUTRITION DIAGNOSIS:  ? ?Severe Malnutrition related to chronic illness (COPD) as evidenced by severe muscle depletion, severe fat depletion. ? ?Ongoing  ? ?GOAL:  ? ?Patient will meet greater than or equal to 90% of their needs ? ?Met with TF ? ?MONITOR:  ? ?Vent status, TF tolerance, Labs ? ?REASON FOR ASSESSMENT:  ? ?Ventilator ?  ? ?ASSESSMENT:  ? ?Pt admitted with difficulty breathing d/t acute respiratory failure with hypoxia and hypercapnia. Pt with diagnosis of PNA 4/16. PMH significant for COPD, uncontrolled anxiety. ? ?Discussed patient in ICU rounds and with RN today. ?S/P trach 5/1. ?Patient is very weak, but able to follow commands. ?Weaning on vent today.  ?May be able to try trach collar soon.  ? ?Cortrak was placed 4/28, tip in the stomach. Currently receiving Osmolite 1.2 at 55 ml/h with Prosource TF 45 ml once daily to provide 1624 kcal, 84 gm protein, 1082 ml free water daily. ?Free water 200 ml every 4 hours.  ? ?Patient remains on ventilator support. ?MV: 9.8 L/min ?Temp (24hrs), Avg:98 ?F (36.7 ?C), Min:97.7 ?F (36.5 ?C), Max:98.4 ?F (36.9 ?C) ? ?  ?Labs reviewed. Na 146, K 5.5 ?CBG: [phone] ? ?Medications reviewed and include novolog, prednisone. ? ?Diet Order:   ?Diet Order   ? ?       ?  Diet NPO time specified  Diet effective now       ?  ? ?  ?  ? ?  ? ? ?EDUCATION NEEDS:  ? ?No education needs have been identified at this time ? ?Skin:  Skin Assessment: Reviewed RN Assessment ? ?Last BM:  5/3 type 7 ? ?Height:  ? ?Ht Readings from Last 1 Encounters:  ?09/25/21 '5\' 1"'  (1.549 m)  ? ? ?Weight:  ? ?Wt Readings from Last 1 Encounters:  ?10/09/21 55.2 kg  ? ? ? ?BMI:  Body mass index is 22.99  kg/m?. ? ?Estimated Nutritional Needs:  ? ?Kcal:  1500-1700 ? ?Protein:  75-90g ? ?Fluid:  >/=1.5L ? ? ? ?Lucas Mallow RD, LDN, CNSC ?Please refer to Amion for contact information.                                                       ? ?

## 2021-10-10 NOTE — Progress Notes (Signed)
Patient seen today by trach team for consult.  No education is needed at this time.  All necessary equipment is at beside.   Will continue to follow for progression. Patient is still on full ventilatory support. 

## 2021-10-10 NOTE — Progress Notes (Signed)
eLink Physician-Brief Progress Note ?Patient Name: [name] ?DOB: 11-13-1955 ?MRN: [id number] ? ? ?Date of Service ? 10/10/2021  ?HPI/Events of Note ? K+ 5.5  ?eICU Interventions ? Lokelma 10 gm  x 1.  ? ? ? ?  ? ?Kerry Kass Anjoli Diemer ?10/10/2021, 6:03 AM ?

## 2021-10-10 NOTE — Progress Notes (Signed)
? ?NAME:  [name], MRN:  [id number], DOB:  [birth year], LOS: 46 ?ADMISSION DATE:  09/24/2021, CONSULTATION DATE:  4/18 ?REFERRING MD:  Tat/ triad, CHIEF COMPLAINT:  resp distress   ? ?History of Present Illness:  ?66 y.o. female quit smoking 2021 with  GOLD 3 COPD MZ, uncontrolled anxiety. Was on 02 one year PTA but "took herself off" per friend at bedside and able to care for pets at home but mostly housebound sinc ethen ? ?She was admitted 4/17 to [facility], ICU with acute hypoxic/hypercarbic respiratory failure, failed BiPAP and required mechanical ventilation, transferred to [facility] 4/21 due to new finding of LV dysfunction ? ? ? ?Significant Hospital Events: ?Including procedures, antibiotic start and stop dates in addition to other pertinent events   ?ET  4/18 c/b hypotension with severe air trapping  ?Echo 4/20 with EF 40% , akinesis of apex and septal wall ?4/24 CTA head negative for any significant abnormality ?4/25 ketamine added ?4/28 increased work of breathing during the wean, severe auto PEEP ?4/29 Febrile 101.2 ?5/1 perc trach placement ? ?Interim History / Subjective:  ? ?Fentanyl and precedex weaned off. Weaning this morning and does better with encouragement.  ? ?Objective   ?Blood pressure 113/61, pulse 80, temperature 97.8 ?F (36.6 ?C), temperature source Axillary, resp. rate 17, height 5\' 1"  (1.549 m), weight 55.2 kg, SpO2 99 %. ?   ?Vent Mode: PSV;CPAP ?FiO2 (%):  [40 %] 40 % ?Set Rate:  [20 bmp] 20 bmp ?Vt Set:  [430 mL] 430 mL ?PEEP:  [5 cmH20] 5 cmH20 ?Pressure Support:  [10 cmH20] 10 cmH20 ?Plateau Pressure:  [10 cmH20-15 cmH20] 13 cmH20  ? ?Intake/Output Summary (Last 24 hours) at 10/10/2021 1116 ?Last data filed at 10/10/2021 [id number] ?Gross per 24 hour  ?Intake 2301.83 ml  ?Output 950 ml  ?Net 1351.83 ml  ? ?Filed Weights  ? 10/07/21 0500 10/08/21 0347 10/09/21 0500  ?Weight: 55.4 kg 55.9 kg 55.2 kg  ? ? ?Examination: ?General appearance: 66 y.o., female, NAD ?Eyes: PERRL, tracking  appropriately ?HENT: NCAT; MMM ?Neck: Trachea midline; no lymphadenopathy, no JVD, 6-0 shiley flex in place ?Lungs: diminished bilaterally, equal chest rise ?CV: RRR, no murmur  ?Abdomen: Soft, non-tender; non-distended, BS present  ?Extremities: trace dependent edema, warm ?Skin: Normal turgor and texture; no rash ?Neuro: follows commands ? ? ?K 5.5 ?Na 146 ?WBCs stable ? ? ? ? ?Assessment & Plan:  ? ?Acute hypoxemic/hypercapnic respiratory failure ?Tracheostomy with vent dependent respiratory failure ?HAP klebsiella pneumonia ?Resolving acute exacerbation of COPD ?S/p trach 5/1.  ?-lung protective ventilation ?-Daily wean trial ?- wean precedex for RASS 0 to -1 ?-bronchodilators ?-continue brief steroid taper ?-completed 7 day ceftriaxone course  ? ?Acute metabolic encephalopathy ?Severe anxiety ?-oxycodone per tube, wean precedex as above ?-Goal RASS 0 to -1 ?-On Seroquel 100 twice daily ?-Continue Klonopin ? ?Acute systolic heart failure ?-New drop in EF of 40% with septal apical akinesis concerning for ischemia ?-H/I on hold with softer pressures, could later add as sedation lifted ?-Low-dose metoprolol ordered , okay to titrate up since no bronchospasm ?-will need ischemic work-up at some point ?-Aspirin on hold due to low platelets ?-holding diuretic ? ?Hypernatremia  ?-increase free water to 200 every 4  ?-Check bmet daily ? ?Thrombocytopenia ?-Platelets low but stable ? ?Goals of care ?-Will need to discuss g-tube as it's looking likely she'll need long course on the vent ?-Sister will update patient's spouse -he is struggling after recent hospital admission and new diagnosis  of lung cancer ?-Sister understands that she will likely need facility care and high chance of vent dependence ?-LTAC consult placed ? ? ?Best Practice (right click and "Reselect all SmartList Selections" daily)  ? ?Diet/type: tubefeeds  ?DVT prophylaxis: LMWH  ?GI prophylaxis: PPI ?Lines: N/A ?Foley:  N/A ?Code Status:  full  code ?Last date of multidisciplinary goals of care discussion [name] update family later today] ? ? ? ?Walker Shadow  ?Miles ? ? ?If no response to pager , please call [phone] until 7 pm ?After 7:00 pm call Elink  [phone]  ? ?10/10/2021 ? ?

## 2021-10-11 ENCOUNTER — Encounter (HOSPITAL_COMMUNITY): Payer: Medicare Other

## 2021-10-11 DIAGNOSIS — J9602 Acute respiratory failure with hypercapnia: Secondary | ICD-10-CM | POA: Diagnosis not present

## 2021-10-11 DIAGNOSIS — J9601 Acute respiratory failure with hypoxia: Secondary | ICD-10-CM | POA: Diagnosis not present

## 2021-10-11 LAB — BASIC METABOLIC PANEL
Anion gap: 7 (ref 5–15)
BUN: 36 mg/dL — ABNORMAL HIGH (ref 8–23)
CO2: 32 mmol/L (ref 22–32)
Calcium: 8.3 mg/dL — ABNORMAL LOW (ref 8.9–10.3)
Chloride: 107 mmol/L (ref 98–111)
Creatinine, Ser: 0.47 mg/dL (ref 0.44–1.00)
GFR, Estimated: >60 mL/min (ref >60)
Glucose, Bld: 126 mg/dL — ABNORMAL HIGH (ref 70–99)
Potassium: 4.2 mmol/L (ref 3.5–5.1)
Sodium: 146 mmol/L — ABNORMAL HIGH (ref 135–145)

## 2021-10-11 LAB — GLUCOSE, CAPILLARY
Glucose-Capillary: 170 mg/dL — ABNORMAL HIGH (ref 70–99)
Glucose-Capillary: 80 mg/dL (ref 70–99)
Glucose-Capillary: 140 mg/dL — ABNORMAL HIGH (ref 70–99)
Glucose-Capillary: 146 mg/dL — ABNORMAL HIGH (ref 70–99)
Glucose-Capillary: 128 mg/dL — ABNORMAL HIGH (ref 70–99)
Glucose-Capillary: 93 mg/dL (ref 70–99)

## 2021-10-11 LAB — CBC
HCT: 29.8 % — ABNORMAL LOW (ref 36.0–46.0)
Hemoglobin: 9.6 g/dL — ABNORMAL LOW (ref 12.0–15.0)
MCH: 32.5 pg (ref 26.0–34.0)
MCHC: 32.2 g/dL (ref 30.0–36.0)
MCV: 101 fL — ABNORMAL HIGH (ref 80.0–100.0)
Platelets: 85 10*3/uL — ABNORMAL LOW (ref 150–400)
RBC: 2.95 MIL/uL — ABNORMAL LOW (ref 3.87–5.11)
RDW: 14.2 % (ref 11.5–15.5)
WBC: 15.6 10*3/uL — ABNORMAL HIGH (ref 4.0–10.5)
nRBC: 0 % (ref 0.0–0.2)

## 2021-10-11 MED ORDER — ZINC OXIDE 40 % EX OINT
TOPICAL_OINTMENT | CUTANEOUS | Status: DC | PRN
  Filled 2021-10-11 (×3): qty 57

## 2021-10-11 MED ORDER — GUANFACINE HCL 1 MG PO TABS
1.0000 mg | ORAL_TABLET | Freq: Two times a day (BID) | ORAL | Status: DC
  Administered 2021-10-11 – 2021-10-14 (×7): 1 mg
  Filled 2021-10-11 (×8): qty 1

## 2021-10-11 NOTE — Progress Notes (Signed)
PT Cancellation Note ? ?Patient Details ?Name: CAISHA KUFAHL ?MRN: [id number] ?DOB: 1955-11-06 ? ? ?Cancelled Treatment:    Reason Eval/Treat Not Completed: (P) Medical issues which prohibited therapy. Pt weaning to trach collar HR in low 130s and RR in 30s. MD notified, pt requiring being placed back on ventilator. Respiratory in room. PT will follow back for re-Eval tomorrow. ? ?Kaimani Clayson B. Migdalia Dk PT, DPT ?Acute Rehabilitation Services ?Please use secure chat or  ?Call Office [phone] ? ? ? ?Beecher ?10/11/2021, 2:51 PM ? ? ?

## 2021-10-11 NOTE — Progress Notes (Signed)
? ?NAME:  [name], MRN:  [id number], DOB:  09-26-1955, LOS: 65 ?ADMISSION DATE:  09/24/2021, CONSULTATION DATE:  4/18 ?REFERRING MD:  Tat/ triad, CHIEF COMPLAINT:  resp distress   ? ?History of Present Illness:  ?66 y.o. female quit smoking 2021 with  GOLD 3 COPD MZ, uncontrolled anxiety. Was on 02 one year PTA but "took herself off" per friend at bedside and able to care for pets at home but mostly housebound sinc ethen ? ?She was admitted 4/17 to [facility], ICU with acute hypoxic/hypercarbic respiratory failure, failed BiPAP and required mechanical ventilation, transferred to [facility] 4/21 due to new finding of LV dysfunction ? ?Significant Hospital Events: ?Including procedures, antibiotic start and stop dates in addition to other pertinent events   ?ET  4/18 c/b hypotension with severe air trapping  ?Echo 4/20 with EF 40% , akinesis of apex and septal wall ?4/24 CTA head negative for any significant abnormality ?4/25 ketamine added ?4/28 increased work of breathing during the wean, severe auto PEEP ?4/29 Febrile 101.2 ?5/1 perc trach placement ? ?Interim History / Subjective:  ?No acute events overnight.  ? ?Objective   ?Blood pressure (!) 104/59, pulse 90, temperature 98.4 ?F (36.9 ?C), temperature source Oral, resp. rate (!) 24, height 5\' 1"  (1.549 m), weight 53.6 kg, SpO2 98 %. ?   ?Vent Mode: PRVC ?FiO2 (%):  [40 %] 40 % ?Set Rate:  [20 bmp] 20 bmp ?Vt Set:  [430 mL] 430 mL ?PEEP:  [5 cmH20] 5 cmH20 ?Plateau Pressure:  [11 cmH20-19 cmH20] 13 cmH20  ? ?Intake/Output Summary (Last 24 hours) at 10/11/2021 0819 ?Last data filed at 10/11/2021 0800 ?Gross per 24 hour  ?Intake 2310.44 ml  ?Output 500 ml  ?Net 1810.44 ml  ? ? ?Filed Weights  ? 10/08/21 0347 10/09/21 0500 10/11/21 0447  ?Weight: 55.9 kg 55.2 kg 53.6 kg  ? ? ?Examination: ?General appearance: 66 y.o., female, NAD ?Eyes: PERRL, tracking appropriately ?HENT: NCAT; MMM ?Neck: Trachea midline; no lymphadenopathy, no JVD, 6-0 shiley flex in place ?Lungs:  Diminished sounds bilaterally, equal chest rise ?CV: RRR, no murmur  ?Abdomen: Soft, non-distended, non-tender. Normoactive bowel sounds.  ?Extremities: Trace dependent edema, warm ?Skin: Normal turgor and texture; no rash ?Neuro: Opens eyes and follows commands ? ?Assessment & Plan:  ? ?#Acute hypoxemic/hypercapnic respiratory failure ?#Tracheostomy with vent dependent respiratory failure ?#HAP klebsiella pneumonia ?#Resolving acute exacerbation of COPD ?S/p trach 5/1. Received full course of ceftriaxone. Continuing steroid taper (last day tomorrow, 5/5). Pending LTACH. ?-Lung protective ventilation ?-Daily wean trial ?-Wean precedex for RASS 0 to -1 ?-Bronchodilators ?-Continue brief steroid taper, last day tomorrow ?-s/p 7 day ceftriaxone course  ? ?#Acute metabolic encephalopathy ?#Severe anxiety ?-Oxycodone per tube, wean precedex as above ?-Goal RASS 0 to -1 ?-On Seroquel 100 twice daily ?-Continue Klonopin ? ?Acute systolic heart failure ?-New drop in EF of 40% with septal apical akinesis concerning for ischemia ?-On low-dose metoprolol, HR and BP stable O/N ?-Holding further anti-hypertensives with current pressures ?-Will need ischemic work-up as outpatient ?-Aspirin on hold 2/2 TCP ?-holding diuretic ? ?Hypernatremia  ?Still has ~1L FWD, continue free water 200cc every 4h  ?- Daily BMP ? ?Thrombocytopenia ?-85 this AM, stable ? ?Goals of care ?- Will need to discuss g-tube, likely will need long course on the vent ?- TOC consult for LTACH placed ? ? ?Best Practice (right click and "Reselect all SmartList Selections" daily)  ? ?Diet/type: tubefeeds  ?DVT prophylaxis: LMWH  ?GI prophylaxis: PPI ?Lines: N/A ?Foley:  N/A ?Code Status:  full code ?Last date of multidisciplinary goals of care discussion [name] update family later today] ? ?Sanjuan Dame, MD ?Internal Medicine PGY-2 ?Pager: [phone] ? ?If no response to pager , please call [phone] until 7 pm ?After 7:00 pm call Elink  [phone]   ? ?10/11/2021 ? ?

## 2021-10-11 NOTE — Evaluation (Addendum)
Passy-Muir Speaking Valve - Evaluation ?Patient Details  ?Name: [name] ?MRN: [id number] ?Date of Birth: 03-Sep-1955 ? ?Today's Date: 10/11/2021 ?Time: 3154-0086 ?SLP Time Calculation (min) (ACUTE ONLY): 29 min ? ?Past Medical History:  ?Past Medical History:  ?Diagnosis Date  ? Anxiety   ? Chronic back pain   ? COPD (chronic obstructive pulmonary disease) (HCC)   ? Tobacco abuse   ? ?Past Surgical History:  ?Past Surgical History:  ?Procedure Laterality Date  ? ABDOMINAL HYSTERECTOMY    ? BACK SURGERY    ? ILEO LOOP NEOBLADDER    ? VESICOVAGINAL FISTULA REPAIR    ? ?HPI:  ?Pt is a 66 yo female presenting to [facility] with acute hypoxic/hypercarbic respiratory failure requiring intubation 4/19 followed by transfer to [facility] after new finding of LV dysfunction. Trach 5/1. PMH includes: COPD, previously on home O2 but "took herself off," former smoker (quit 2021), uncontrolled anxiety  ? ? ?Assessment / Plan / Recommendation ? ?Clinical Impression ? Pt was seen in conjunction with RT for initial evaluation for PMV. Initially planned for inline PMV although RT planning for TC trial, so SLP arrived shortly after removal from vent. Pt's RR was elevated in the 30s, up to low 40s. SLP and RT provided cues to try to reduce RR. Cuff was already deflated, so SLP did attempt digital occlusion with pt moving her lips and seeming to move air through upper airway, but without any phonation achieved. Anticipate dysphonia in setting of prolonged intubation although also question pt initiation. As SLP was present, suspect medications took effect, as she became more lethargic, falling asleep but with lower RR (low 20s). PMV was not placed today given mentation and respirtory status just coming off the vent. However, education was also provided to family about the valve, its use, and anticipated POC. SLP plans to coordinate plan with RT again to attempt PMV trial, hopefully on TC. Would not place PMV until SLP assess with valve first. ?SLP  Visit Diagnosis: Aphonia (R49.1)  ?  ?SLP Assessment ? Patient needs continued Speech Lanaguage Pathology Services  ?  ?Recommendations for follow up therapy are one component of a multi-disciplinary discharge planning process, led by the attending physician.  Recommendations may be updated based on patient status, additional functional criteria and insurance authorization. ? ?Follow Up Recommendations ? Acute inpatient rehab (3hours/day) ? ?  ?Assistance Recommended at Discharge Frequent or constant Supervision/Assistance  ?Functional Status Assessment Patient has had a recent decline in their functional status and demonstrates the ability to make significant improvements in function in a reasonable and predictable amount of time.  ?Frequency and Duration min 2x/week  ?  ?  ? ?PMSV Trial PMSV was placed for: 0 ?  ?Tracheostomy Tube ?    ?  ?Vent Dependency ? FiO2 (%): 40 %  ?  ?Cuff Deflation Trial Tolerated Cuff Deflation:  (deflated upon arrival) ?Behavior: Alert;Anxious ?Cuff Deflation Trial - Comments: attempted finger occlusion, RR elevated  ?   ? ? ?  ?Mahala Menghini., M.A. CCC-SLP ?Acute Rehabilitation Services ?Office [phone] ? ?Secure chat preferred ? ?10/11/2021, 12:32 PM ? ?

## 2021-10-11 NOTE — Evaluation (Addendum)
Occupational Therapy Evaluation Patient Details Name: [name] MRN: [id number] DOB: 07-Mar-1956 Today's Date: 10/11/2021   History of Present Illness 66 y/o F reports diagnosis of PNA on 4/16 Admitted 4/17 to [facility], ICU with acute hypoxic/hypercarbic respiratory failure, failed BiPAP and required mechanical ventilation, transferred to [facility] 4/21 due to new finding of LV dysfunction Echo 4/20 with EF 40% , akinesis of apex and septal wall. . Failed weaning attempt 4/28. PMH significant for COPD, tobacco use, HLD, GRED, and uncontrolled anxiety.   Clinical Impression   Pt admitted for concerns listed above. PTA pt's family reported that she was completely independent, using no AD for mobility. At this time,  pt presents with increased weakness, difficulty attending to tasks, balance concerns, and decreased activity tolerance. She is requiring max A to complete ROM of UE with minimal strength to provide assist/resistance. With bed in chair position, pt quickly fatigues attempting to hold her head up, requiring rest breaks to relax her head and neck against a pillow. Anticipate at this time, all bed mobility requiring total assist +2 due to global weakness. Pt attempting to participate and try all tasks asked of her, visibly frustrated by weakness. Recommending LTACH at this time due to pt's medical needs including vent. As pt progresses, if she is able to tolerate trach collar and progress activity tolerance and strength, pt may benefit from further more intensive therapies. OT will follow acutely.   Pt on vent with FIO2 40% and Peep of 5 throughout session, tolerating well.      Recommendations for follow up therapy are one component of a multi-disciplinary discharge planning process, led by the attending physician.  Recommendations may be updated based on patient status, additional functional criteria and insurance authorization.   Follow Up Recommendations  OT at Long-term acute care  hospital    Assistance Recommended at Discharge Frequent or constant Supervision/Assistance  Patient can return home with the following Two people to help with walking and/or transfers;Two people to help with bathing/dressing/bathroom;Assistance with cooking/housework;Assistance with feeding;Direct supervision/assist for medications management;Direct supervision/assist for financial management;Assist for transportation;Help with stairs or ramp for entrance    Functional Status Assessment  Patient has had a recent decline in their functional status and demonstrates the ability to make significant improvements in function in a reasonable and predictable amount of time.  Equipment Recommendations  Other (comment) (TBD)    Recommendations for Other Services       Precautions / Restrictions Precautions Precautions: Fall Precaution Comments: intubated, NG tube Restrictions Weight Bearing Restrictions: No      Mobility Bed Mobility               General bed mobility comments: Most likely will require total A +1-2 for all aspects of bed mobility at this time due to weakness    Transfers                   General transfer comment: Deferred due to weakness      Balance Overall balance assessment: Needs assistance Sitting-balance support: Feet unsupported, Bilateral upper extremity supported Sitting balance-Leahy Scale: Zero                                     ADL either performed or assessed with clinical judgement   ADL Overall ADL's : Needs assistance/impaired  General ADL Comments: Total assist + 1-2 for all ADL's.     Vision Baseline Vision/History: 1 Wears glasses Ability to See in Adequate Light: 0 Adequate Patient Visual Report: No change from baseline Vision Assessment?: Vision impaired- to be further tested in functional context Additional Comments: Pt unable to read month on  calendar when placed in front of her..     Perception     Praxis      Pertinent Vitals/Pain Pain Assessment Pain Assessment: No/denies pain     Hand Dominance Right   Extremity/Trunk Assessment Upper Extremity Assessment Upper Extremity Assessment: RUE deficits/detail;LUE deficits/detail RUE Deficits / Details: Minimal strength, unable to lift against gravity, PROM limited due to bed position and edema, grip weak with minimal thumb mobility. RUE Coordination: decreased fine motor;decreased gross motor LUE Deficits / Details: Minimal strength, unable to lift against gravity, PROM limited due to bed position and edema, grip weak with minimal thumb mobility. minutely stronger than RUE. LUE Coordination: decreased fine motor;decreased gross motor   Lower Extremity Assessment Lower Extremity Assessment: Defer to PT evaluation   Cervical / Trunk Assessment Cervical / Trunk Assessment: Normal   Communication Communication Communication: Tracheostomy   Cognition Arousal/Alertness: Awake/alert Behavior During Therapy: Flat affect Overall Cognitive Status: Difficult to assess                                       General Comments  VSS on  vent FI02 40% PEEP of 5.    Exercises Exercises: Other exercises Other Exercises Other Exercises: PROM/AAROM of BUE- shoulder flex and abduction, elbow flex/extension, gripping and opening hands Other Exercises: AAROM BLE- heel slides, ankle pumps, and hip adductions/abductions with knees bent   Shoulder Instructions      Home Living Family/patient expects to be discharged to:: Private residence Living Arrangements: Spouse/significant other Available Help at Discharge: Family Type of Home: House Home Access: Stairs to enter Secretary/administrator of Steps: 4 steps Entrance Stairs-Rails: Right Home Layout: One level     Bathroom Shower/Tub: Producer, television/film/video: Standard     Home Equipment: Shower seat -  built in          Prior Functioning/Environment Prior Level of Function : Independent/Modified Independent                        OT Problem List: Decreased strength;Decreased range of motion;Decreased activity tolerance;Impaired balance (sitting and/or standing);Impaired vision/perception;Decreased cognition;Decreased safety awareness;Decreased knowledge of use of DME or AE;Cardiopulmonary status limiting activity;Impaired UE functional use      OT Treatment/Interventions: Self-care/ADL training;Therapeutic exercise;Energy conservation;DME and/or AE instruction;Therapeutic activities;Cognitive remediation/compensation;Visual/perceptual remediation/compensation;Patient/family education;Balance training    OT Goals(Current goals can be found in the care plan section) Acute Rehab OT Goals Patient Stated Goal: To be able to scratch her nose OT Goal Formulation: With patient/family Time For Goal Achievement: 10/25/21 Potential to Achieve Goals: Good ADL Goals Additional ADL Goal #1: Pt will complete rolling in bed both sides with mod assist as a precurser to sitting EOB. Additional ADL Goal #2: Pt will visually scan room and find 3 objects with independence. Additional ADL Goal #3: Pt will complete AAROM of BUE with 50%/mod assist. Additional ADL Goal #4: Pt will hold and utilize tooth brush sponge to sucessfully brush her teeth and mouth with min A.  OT Frequency: Min 2X/week    Co-evaluation  AM-PAC OT "6 Clicks" Daily Activity     Outcome Measure Help from another person eating meals?: Total Help from another person taking care of personal grooming?: Total Help from another person toileting, which includes using toliet, bedpan, or urinal?: Total Help from another person bathing (including washing, rinsing, drying)?: Total Help from another person to put on and taking off regular upper body clothing?: Total Help from another person to put on and taking off  regular lower body clothing?: Total 6 Click Score: 6   End of Session Equipment Utilized During Treatment: Oxygen Nurse Communication: Mobility status  Activity Tolerance: Patient tolerated treatment well Patient left: in bed;with bed alarm set;with call bell/phone within reach  OT Visit Diagnosis: Unsteadiness on feet (R26.81);Other abnormalities of gait and mobility (R26.89);Muscle weakness (generalized) (M62.81)                Time: 2130-8657 OT Time Calculation (min): 33 min Charges:  OT General Charges $OT Visit: 1 Visit OT Evaluation $OT Eval Moderate Complexity: 1 Mod OT Treatments $Therapeutic Activity: 8-22 mins  Zian Delair H., OTR/L Acute Rehabilitation  Olamae Ferrara Elane Ejay Lashley 10/11/2021, 2:06 PM

## 2021-10-11 NOTE — Progress Notes (Signed)
PT Cancellation Note ? ?Patient Details ?Name: DELORUS LANGWELL ?MRN: [id number] ?DOB: 10-03-1955 ? ? ?Cancelled Treatment:    Reason Eval/Treat Not Completed: (P) Patient at procedure or test/unavailable Pt recently extubated and working with SLP. PT will follow back this afternoon as able.  ? ?Ranell Skibinski B. Beverely Risen PT, DPT ?Acute Rehabilitation Services ?Please use secure chat or  ?Call Office [phone] ? ? ? ?Elon Alas Fleet ?10/11/2021, 11:27 AM ? ? ?

## 2021-10-11 NOTE — TOC Progression Note (Signed)
Transition of Care (TOC) - Progression Note  ? ? ?Patient Details  ?Name: [name] ?MRN: [id number] ?Date of Birth: 28-Jun-1955 ? ?Transition of Care (TOC) CM/SW Contact  ?Lorri Frederick, LCSW ?Phone Number: ?10/11/2021, 11:09 AM ? ?Clinical Narrative:   CSW spoke with pt husband Ramon Dredge by phone and with pt sister Geri Seminole in the room and they have decided to move forward with LTAC at [facility].  [facility] and Emily[facility] notified.   ? ? ? ?Expected Discharge Plan: Long Term Acute Care (LTAC) ?Barriers to Discharge: Continued Medical Work up ? ?Expected Discharge Plan and Services ?Expected Discharge Plan: Long Term Acute Care (LTAC) ?In-house Referral: Clinical Social Work ?  ?Post Acute Care Choice: Long Term Acute Care (LTAC) ?Living arrangements for the past 2 months: Single Family Home ?                ?  ?  ?  ?  ?  ?  ?  ?  ?  ?  ? ? ?Social Determinants of Health (SDOH) Interventions ?  ? ?Readmission Risk Interventions ?   ? View : No data to display.  ?  ?  ?  ? ? ?

## 2021-10-12 DIAGNOSIS — J9602 Acute respiratory failure with hypercapnia: Secondary | ICD-10-CM | POA: Diagnosis not present

## 2021-10-12 DIAGNOSIS — I5022 Chronic systolic (congestive) heart failure: Secondary | ICD-10-CM | POA: Diagnosis not present

## 2021-10-12 DIAGNOSIS — J9601 Acute respiratory failure with hypoxia: Secondary | ICD-10-CM | POA: Diagnosis not present

## 2021-10-12 DIAGNOSIS — G9340 Encephalopathy, unspecified: Secondary | ICD-10-CM | POA: Diagnosis not present

## 2021-10-12 LAB — BASIC METABOLIC PANEL
Anion gap: 5 (ref 5–15)
BUN: 27 mg/dL — ABNORMAL HIGH (ref 8–23)
CO2: 31 mmol/L (ref 22–32)
Calcium: 8.2 mg/dL — ABNORMAL LOW (ref 8.9–10.3)
Chloride: 105 mmol/L (ref 98–111)
Creatinine, Ser: 0.56 mg/dL (ref 0.44–1.00)
GFR, Estimated: >60 mL/min (ref >60)
Glucose, Bld: 132 mg/dL — ABNORMAL HIGH (ref 70–99)
Potassium: 4.2 mmol/L (ref 3.5–5.1)
Sodium: 141 mmol/L (ref 135–145)

## 2021-10-12 LAB — GLUCOSE, CAPILLARY
Glucose-Capillary: 137 mg/dL — ABNORMAL HIGH (ref 70–99)
Glucose-Capillary: 94 mg/dL (ref 70–99)
Glucose-Capillary: 151 mg/dL — ABNORMAL HIGH (ref 70–99)
Glucose-Capillary: 155 mg/dL — ABNORMAL HIGH (ref 70–99)
Glucose-Capillary: 117 mg/dL — ABNORMAL HIGH (ref 70–99)

## 2021-10-12 LAB — CBC
HCT: 29.4 % — ABNORMAL LOW (ref 36.0–46.0)
Hemoglobin: 9.6 g/dL — ABNORMAL LOW (ref 12.0–15.0)
MCH: 33.2 pg (ref 26.0–34.0)
MCHC: 32.7 g/dL (ref 30.0–36.0)
MCV: 101.7 fL — ABNORMAL HIGH (ref 80.0–100.0)
Platelets: 90 10*3/uL — ABNORMAL LOW (ref 150–400)
RBC: 2.89 MIL/uL — ABNORMAL LOW (ref 3.87–5.11)
RDW: 14.3 % (ref 11.5–15.5)
WBC: 14.7 10*3/uL — ABNORMAL HIGH (ref 4.0–10.5)
nRBC: 0 % (ref 0.0–0.2)

## 2021-10-12 MED ORDER — OXYCODONE HCL 5 MG PO TABS
5.0000 mg | ORAL_TABLET | Freq: Four times a day (QID) | ORAL | Status: DC
  Administered 2021-10-12 – 2021-10-15 (×12): 5 mg
  Filled 2021-10-12 (×12): qty 1

## 2021-10-12 MED ORDER — GERHARDT'S BUTT CREAM
TOPICAL_CREAM | Freq: Four times a day (QID) | CUTANEOUS | Status: DC
  Administered 2021-10-15 – 2021-11-09 (×8): 1 via TOPICAL
  Filled 2021-10-12 (×4): qty 1

## 2021-10-12 MED ORDER — QUETIAPINE FUMARATE 50 MG PO TABS
50.0000 mg | ORAL_TABLET | Freq: Two times a day (BID) | ORAL | Status: DC
  Administered 2021-10-12 – 2021-10-15 (×6): 50 mg
  Filled 2021-10-12 (×6): qty 1

## 2021-10-12 MED ORDER — LOSARTAN POTASSIUM 25 MG PO TABS
25.0000 mg | ORAL_TABLET | Freq: Every day | ORAL | Status: DC
  Administered 2021-10-12 – 2021-10-13 (×2): 25 mg via ORAL
  Filled 2021-10-12 (×3): qty 1

## 2021-10-12 NOTE — Progress Notes (Signed)
? ?NAME:  [name], MRN:  [id number], DOB:  11/21/1955, LOS: 42 ?ADMISSION DATE:  09/24/2021, CONSULTATION DATE:  4/18 ?REFERRING [name]:  Tat/ triad, CHIEF COMPLAINT:  resp distress   ? ?History of Present Illness:  ?66 y.o. female quit smoking 2021 with  GOLD 3 COPD MZ, uncontrolled anxiety. Was on 02 one year PTA but "took herself off" per friend at bedside and able to care for pets at home but mostly housebound sinc ethen ? ?She was admitted 4/17 to [facility], ICU with acute hypoxic/hypercarbic respiratory failure, failed BiPAP and required mechanical ventilation, transferred to [facility] 4/21 due to new finding of LV dysfunction ? ?Significant Hospital Events: ?Including procedures, antibiotic start and stop dates in addition to other pertinent events   ?ET  4/18 c/b hypotension with severe air trapping  ?Echo 4/20 with EF 40% , akinesis of apex and septal wall ?4/24 CTA head negative for any significant abnormality ?4/25 ketamine added ?4/28 increased work of breathing during the wean, severe auto PEEP ?4/29 Febrile 101.2 ?5/1 perc trach placement ? ?Interim History / Subjective:  ?No acute events overnight, tolerated trach collar for four hours yesterday.  ? ?Objective   ?Blood pressure 112/60, pulse 85, temperature 98.6 ?F (37 ?C), temperature source Axillary, resp. rate (!) 21, height 5\' 1"  (1.549 m), weight 53.6 kg, SpO2 98 %. ?   ?Vent Mode: PRVC ?FiO2 (%):  [40 %] 40 % ?Set Rate:  [20 bmp] 20 bmp ?Vt Set:  [430 mL] 430 mL ?PEEP:  [5 cmH20] 5 cmH20 ?Plateau Pressure:  [12 cmH20-17 cmH20] 13 cmH20  ? ?Intake/Output Summary (Last 24 hours) at 10/12/2021 0715 ?Last data filed at 10/12/2021 0400 ?Gross per 24 hour  ?Intake 1683.14 ml  ?Output 400 ml  ?Net 1283.14 ml  ? ? ?Filed Weights  ? 10/08/21 0347 10/09/21 0500 10/11/21 0447  ?Weight: 55.9 kg 55.2 kg 53.6 kg  ? ? ?Examination: ?General appearance: 66 y.o., female, NAD ?Eyes: No gross abnormalities, tracks appropriately ?HENT: Normocephalic, atraumatic. ?Neck:  Trachea midline; no lymphadenopathy. No JVD ?Lungs: Normal respiratory effort, clear to ausculation bilaterally ?CV: Regular rate, rhythm. No murmurs appreciated. ?Abdomen: Soft, non-distended, non-tender. Normoactive bowel sounds.  ?Extremities: Trace dependent edema, warm ?Skin: Warm, normal turgor and texture; no rash ?Neuro: Opens eyes and follows commands. ? ?Assessment & Plan:  ? ?#Acute hypoxemic/hypercapnic respiratory failure ?#Tracheostomy with vent dependent respiratory failure ?#HAP klebsiella pneumonia ?#Resolving acute exacerbation of COPD ?S/p trach 5/1. Received full course of ceftriaxone. Continuing steroid taper (last day today). Did tolerate trach collar yesterday for four hours, will trial again today. Since patient has been stable for the last few days, will start to taper down on oxycodone.  ?-Lung protective ventilation ?-Daily wean trial ?-Wean precedex for RASS 0 to -1 ?-Trach collar today ?-Continue Brovana, pulmicort nebs daily ?-Continue brief steroid taper, last day today ?-Decrease oxycodone 5mg  every 6h ? ?#Newly diagnosed heart failure w/ mildly reduced EF ?Echo earlier this admission revealed new drop in EF of 40% with septal apical akinesis concerning for ischemia. On ASA and low-dose beta blocker. Per cardiology, will start losartan today, other GDMT likely will need to be initiated as outpatient. Will also defer ischemic work-up for outpatient. Appears euvolemic on exam, weight & renal function stable.  ?-Start losartan 25mg  daily ?-Aspirin 81mg  daily ?-Metoprolol 12.5mg  twice daily ?-Ischemic evaluation as outpatient ? ?#Acute metabolic encephalopathy - resolved ?#Severe anxiety ?No longer requiring Precedex, will continue with Klonopin. Will plan to start tapering quetiapine. ?-  Klonopin 1mg  twice daily ?- Decrease Seroquel 50mg  twice daily ? ?Thrombocytopenia ?Unclear source, possibly due to acute illness. 90 this AM, stable. No signs or symptoms of bleeding. ?-Daily CBC.   ? ?Goals of care ?- Will need to discuss g-tube, likely will need long course on the vent ?- TOC consult for LTACH placed ? ? ?Best Practice (right click and "Reselect all SmartList Selections" daily)  ? ?Diet/type: tubefeeds  ?DVT prophylaxis: LMWH  ?GI prophylaxis: PPI ?Lines: N/A ?Foley:  N/A ?Code Status:  full code ?Last date of multidisciplinary goals of care discussion [name] update family later today] ? ?[name], [name] ?Internal Medicine PGY-2 ?Pager: [phone] ? ?If no response to pager , please call [phone] until 7 pm ?After 7:00 pm call Elink  [phone]  ? ?10/12/2021 ? ?

## 2021-10-12 NOTE — Progress Notes (Signed)
Speech Language Pathology Treatment: [name] Speaking valve  ?Patient Details ?Name: [name] ?MRN: [id number] ?DOB: 05-25-1956 ?Today's Date: 10/12/2021 ?Time: 1015-1030 ?SLP Time Calculation (min) (ACUTE ONLY): 15 min ? ?Assessment / Plan / Recommendation ?Clinical Impression ? Pt was seen this morning, coordinating again with RT to see pt shortly after coming off the vent. Her sister at bedside believed that her VS initially looked better transitioning to [facility] today, but at the time of SLP visit, SpO2 is 86-87% and RR 37. RN made aware of current respiratory status and later touched base with RT too. Decision was made to not place PMV in light of current respiratory status; however, pt's sister had some questions about PMV and swallowing. Education was provided with use of pictures to show anatomy s/p trach and how valve can change airflow as well as how swallowing can sometimes be acutely altered after events of hospitalization and current respiratory status. Would not place PMV until SLP can assess. ?  ?HPI HPI: Pt is a 66 yo female presenting to [facility] with acute hypoxic/hypercarbic respiratory failure requiring intubation 4/19 followed by transfer to [facility] after new finding of LV dysfunction. Trach 5/1. PMH includes: COPD, previously on home O2 but "took herself off," former smoker (quit 2021), uncontrolled anxiety ?  ?   ?SLP Plan ? Continue with current plan of care ? ?  ?  ?Recommendations for follow up therapy are one component of a multi-disciplinary discharge planning process, led by the attending physician.  Recommendations may be updated based on patient status, additional functional criteria and insurance authorization. ?  ? ?Recommendations  ?   ?   ? Patient may use Passy-Muir Speech Valve: with SLP only  ?   ? ? ? ? Oral Care Recommendations: Oral care QID ?Follow Up Recommendations: Acute inpatient rehab (3hours/day) ?Assistance recommended at discharge: Frequent or constant  Supervision/Assistance ?SLP Visit Diagnosis: Aphonia (R49.1) ?Plan: Continue with current plan of care ? ? ? ? ?  ?  ? ? ?Mahala Menghini., M.A. CCC-SLP ?Acute Rehabilitation Services ?Office [phone] ? ?Secure chat preferred ? ? ?10/12/2021, 11:09 AM ?

## 2021-10-12 NOTE — Progress Notes (Signed)
? ?Progress Note ? ?Patient Name: [name] ?Date of Encounter: 10/12/2021 ? ?[facility] Cardiologist: Dorris Carnes, MD  ? ?Subjective  ? ?Shakes head no to CP or dyspnea ? ?Inpatient Medications  ?  ?Scheduled Meds: ? arformoterol  15 mcg Nebulization BID  ? aspirin  81 mg Per NG tube Daily  ? budesonide (PULMICORT) nebulizer solution  0.5 mg Nebulization BID  ? chlorhexidine gluconate (MEDLINE KIT)  15 mL Mouth Rinse BID  ? Chlorhexidine Gluconate Cloth  6 each Topical Q0600  ? clonazePAM  1 mg Per Tube BID  ? docusate  100 mg Per Tube BID  ? enoxaparin (LOVENOX) injection  40 mg Subcutaneous Q24H  ? feeding supplement (PROSource TF)  45 mL Per Tube Daily  ? fentaNYL (SUBLIMAZE) injection  200 mcg Intravenous Once  ? fentaNYL (SUBLIMAZE) injection  50 mcg Intravenous Once  ? free water  200 mL Per Tube Q4H  ? guanFACINE  1 mg Per Tube BID  ? insulin aspart  0-20 Units Subcutaneous Q4H  ? mouth rinse  15 mL Mouth Rinse 10 times per day  ? metoprolol tartrate  12.5 mg Per Tube BID  ? montelukast  10 mg Per Tube q AM  ? oxyCODONE  10 mg Per Tube Q6H  ? pantoprazole sodium  40 mg Per Tube Daily  ? polyethylene glycol  17 g Per Tube Daily  ? predniSONE  10 mg Per Tube Q breakfast  ? QUEtiapine  100 mg Per Tube BID  ? revefenacin  175 mcg Nebulization Daily  ? rosuvastatin  20 mg Per NG tube Daily  ? ?Continuous Infusions: ? sodium chloride Stopped (10/09/21 1028)  ? sodium chloride    ? dexmedetomidine (PRECEDEX) IV infusion Stopped (10/11/21 1058)  ? feeding supplement (OSMOLITE 1.2 CAL) 1,000 mL (10/10/21 2054)  ? fentaNYL infusion INTRAVENOUS Stopped (10/10/21 0737)  ? norepinephrine (LEVOPHED) Adult infusion Stopped (10/09/21 5726)  ? ?PRN Meds: ?sodium chloride, acetaminophen **OR** acetaminophen, docusate, fentaNYL, fentaNYL, guaiFENesin-dextromethorphan, levalbuterol, liver oil-zinc oxide, polyethylene glycol, polyethylene glycol, senna, white petrolatum  ? ?Vital Signs  ?  ?Vitals:  ? 10/12/21 0700  10/12/21 0729 10/12/21 0748 10/12/21 0800  ?BP: 112/62   112/69  ?Pulse: (!) 218   95  ?Resp: 19   (!) 27  ?Temp:  98 ?F (36.7 ?C)    ?TempSrc:  Oral    ?SpO2: 100%  100% 98%  ?Weight:      ?Height:      ? ? ?Intake/Output Summary (Last 24 hours) at 10/12/2021 0818 ?Last data filed at 10/12/2021 0400 ?Gross per 24 hour  ?Intake 1523.14 ml  ?Output 400 ml  ?Net 1123.14 ml  ? ? ? ?  10/11/2021  ?  4:47 AM 10/09/2021  ?  5:00 AM 10/08/2021  ?  3:47 AM  ?Last 3 Weights  ?Weight (lbs) 118 lb 2.7 oz 121 lb 11.1 oz 123 lb 3.8 oz  ?Weight (kg) 53.6 kg 55.2 kg 55.9 kg  ?   ? ?Telemetry  ?  ?Sinus with brief PAT - Personally Reviewed ? ?Physical Exam  ? ?GEN: WD, chronically ill appearing ?Neck: s/p trach ?Cardiac: RRR ?Respiratory: CTA; no wheeze ?GI: Soft, NT/ND, no masses ?MS: No edema ?Neuro:  Moves all ext; no focal findings ? ?Labs  ?  ?High Sensitivity Troponin:   ?Recent Labs  ?Lab 09/24/21 ?1707 09/24/21 ?1931  ?TROPONINIHS 7 12  ? ?   ?Chemistry ?Recent Labs  ?Lab 10/06/21 ?0327 10/07/21 ?0124 10/08/21 ?2035  10/09/21 ?0154 10/10/21 ?0150 10/11/21 ?0110 10/12/21 ?0230  ?NA 150* 144 143   < > 146* 146* 141  ?K 4.3 4.2 4.1   < > 5.5* 4.2 4.2  ?CL 110 103 102   < > 106 107 105  ?CO2 35* 35* 33*   < > 33* 32 31  ?GLUCOSE 168* 199* 113*   < > 120* 126* 132*  ?BUN 50* 44* 42*   < > 41* 36* 27*  ?CREATININE 0.65 0.60 0.43*   < > 0.48 0.47 0.56  ?CALCIUM 8.7* 8.4* 8.3*   < > 8.7* 8.3* 8.2*  ?MG 2.5* 2.3 2.3  --   --   --   --   ?PROT 5.7*  --   --   --   --   --   --   ?ALBUMIN 2.6*  --   --   --   --   --   --   ?AST 48*  --   --   --   --   --   --   ?ALT 182*  --   --   --   --   --   --   ?ALKPHOS 111  --   --   --   --   --   --   ?BILITOT 0.7  --   --   --   --   --   --   ?GFRNONAA >60 >60 >60   < > >60 >60 >60  ?ANIONGAP _0 < > _1 ? < > = values in this interval not displayed.  ? ?  ? ?Hematology ?Recent Labs  ?Lab 10/10/21 ?0150 10/11/21 ?0110 10/12/21 ?0230  ?WBC 21.9* 15.6* 14.7*  ?RBC 3.24* 2.95* 2.89*  ?HGB 10.5*  9.6* 9.6*  ?HCT 33.1* 29.8* 29.4*  ?MCV 102.2* 101.0* 101.7*  ?MCH 32.4 32.5 33.2  ?MCHC 31.7 32.2 32.7  ?RDW 14.3 14.2 14.3  ?PLT PLATELET CLUMPS NOTED ON SMEAR, COUNT APPEARS DECREASED 85* 90*  ? ? ? ?Radiology  ?  ?No results found. ? ? ?Patient Profile  ?   ?66 y.o. 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 Carles Collet.  Echocardiogram this admission shows ejection fraction 40% with anteroseptal and apical akinesis, grade 1 diastolic dysfunction. ? ?Assessment & Plan  ?  ?1 acute combined systolic/diastolic congestive heart failure-as outlined in previous notes ejection fraction is 40% with wall motion abnormality.  We will continue medical therapy with aspirin, metoprolol and statin.  We will add low-dose losartan.  She is not volume overloaded and would not diurese at this point.  Can consider addition of Jardiance or Wilder Glade as an outpatient.  She will ultimately require ischemia evaluation but needs to make significant improvement from other medical issues prior to that.  This could certainly be done as an outpatient.  ? ?2 COPD/pneumonia/s/p trach.-managed by critical care medicine. ? ?3 acute metabolic encephalopathy-improved. ? ?We will see again Monday.  Please call prior to then with questions.   ? ?For questions or updates, please contact [facility] ?Please consult [email or web address] for contact info under  ? ?  ?   ?Signed, ?Kirk Ruths, MD  ?10/12/2021, 8:18 AM   ? ?

## 2021-10-12 NOTE — Progress Notes (Signed)
Physical Therapy Treatment ?Patient Details ?Name: [name] ?MRN: [id number] ?DOB: 1955-09-20 ?Today's Date: 10/12/2021 ? ? ?History of Present Illness 66 y/o F reports diagnosis of PNA on 4/16 Admitted 4/17 to [facility], ICU with acute hypoxic/hypercarbic respiratory failure, failed BiPAP and required mechanical ventilation, transferred to [facility] 4/21 due to new finding of LV dysfunction Echo 4/20 with EF 40% , akinesis of apex and septal wall. . Failed weaning attempt 4/28. PMH significant for COPD, tobacco use, HLD, GRED, and uncontrolled anxiety. ? ?  ?PT Comments  ? ? Pt on trach collar most of the day. RR 28-40 but pt tolerated brief periods of exercise with rest breaks. Worked on strengthening and upright chair positioning in preparation for incr mobility as respiratory status continues to improve.    ?Recommendations for follow up therapy are one component of a multi-disciplinary discharge planning process, led by the attending physician.  Recommendations may be updated based on patient status, additional functional criteria and insurance authorization. ? ?Follow Up Recommendations ? Skilled nursing-short term rehab (<3 hours/day) ?  ?  ?Assistance Recommended at Discharge Frequent or constant Supervision/Assistance  ?Patient can return home with the following Two people to help with walking and/or transfers;Two people to help with bathing/dressing/bathroom;Assist for transportation;Help with stairs or ramp for entrance ?  ?Equipment Recommendations ? Rolling walker (2 wheels);BSC/3in1;Wheelchair (measurements PT);Wheelchair cushion (measurements PT);Other (comment) (hoyer lift)  ?  ?Recommendations for Other Services   ? ? ?  ?Precautions / Restrictions Precautions ?Precautions: Fall ?Precaution Comments: trach (vent vs trach collar), NG tube ?Restrictions ?Weight Bearing Restrictions: No  ?  ? ?Mobility ? Bed Mobility ?Overal bed mobility: Needs Assistance ?  ?  ?  ?  ?  ?  ?General bed mobility comments:  Placed bed in chair position. Assisted pt with bringing trunk forward with max assist ?  ? ?Transfers ?  ?  ?  ?  ?  ?  ?  ?  ?  ?  ?  ? ?Ambulation/Gait ?  ?  ?  ?  ?  ?  ?  ?  ? ? ?Stairs ?  ?  ?  ?  ?  ? ? ?Wheelchair Mobility ?  ? ?Modified Rankin (Stroke Patients Only) ?  ? ? ?  ?Balance   ?  ?  ?  ?  ?  ?  ?  ?  ?  ?  ?  ?  ?  ?  ?  ?  ?  ?  ?  ? ?  ?Cognition Arousal/Alertness: Awake/alert ?Behavior During Therapy: Flat affect ?Overall Cognitive Status: Difficult to assess ?  ?  ?  ?  ?  ?  ?  ?  ?  ?  ?  ?  ?  ?  ?  ?  ?General Comments: Follow commands. Mouthing words but difficult to read lips ?  ?  ? ?  ?Exercises General Exercises - Lower Extremity ?Ankle Circles/Pumps: AAROM, Both, 10 reps, Supine ?Short Arc Quad: AAROM, Both, 10 reps, Supine ?Heel Slides: AAROM, Both, 10 reps, Supine ?Hip ABduction/ADduction: AAROM, Both, 10 reps, Supine ? ?  ?General Comments General comments (skin integrity, edema, etc.): RR 28-40 on trach collar ?  ?  ? ?Pertinent Vitals/Pain Pain Assessment ?Pain Assessment: CPOT ?Faces Pain Scale: No hurt ?Facial Expression: Relaxed, neutral ?Body Movements: Absence of movements ?Muscle Tension: Relaxed ?Compliance with ventilator (intubated pts.): N/A ?Vocalization (extubated pts.): N/A ?CPOT Total: 0  ? ? ?Home Living   ?  ?  ?  ?  ?  ?  ?  ?  ?  ?   ?  ?  Prior Function    ?  ?  ?   ? ?PT Goals (current goals can now be found in the care plan section) Progress towards PT goals: Progressing toward goals ? ?  ?Frequency ? ? ? Min 3X/week ? ? ? ?  ?PT Plan Current plan remains appropriate  ? ? ?Co-evaluation   ?  ?  ?  ?  ? ?  ?AM-PAC PT "6 Clicks" Mobility   ?Outcome Measure ? Help needed turning from your back to your side while in a flat bed without using bedrails?: Total ?Help needed moving from lying on your back to sitting on the side of a flat bed without using bedrails?: Total ?Help needed moving to and from a bed to a chair (including a wheelchair)?: Total ?Help needed  standing up from a chair using your arms (e.g., wheelchair or bedside chair)?: Total ?Help needed to walk in hospital room?: Total ?Help needed climbing 3-5 steps with a railing? : Total ?6 Click Score: 6 ? ?  ?End of Session Equipment Utilized During Treatment: Oxygen ?Activity Tolerance: Patient limited by fatigue ?Patient left: in bed;with call bell/phone within reach;with family/visitor present ?  ?PT Visit Diagnosis: Other abnormalities of gait and mobility (R26.89);Muscle weakness (generalized) (M62.81);Difficulty in walking, not elsewhere classified (R26.2) ?  ? ? ?Time: [id number] ?PT Time Calculation (min) (ACUTE ONLY): 16 min ? ?Charges:  $Therapeutic Activity: 8-22 mins          ?          ? ?[facility] PT ?Acute Rehabilitation Services ?Office [phone] ? ? ? ?Shary Decamp [facility] ?10/12/2021, 6:05 PM ? ?

## 2021-10-13 DIAGNOSIS — G9340 Encephalopathy, unspecified: Secondary | ICD-10-CM | POA: Diagnosis not present

## 2021-10-13 DIAGNOSIS — J9602 Acute respiratory failure with hypercapnia: Secondary | ICD-10-CM | POA: Diagnosis not present

## 2021-10-13 DIAGNOSIS — J9601 Acute respiratory failure with hypoxia: Secondary | ICD-10-CM | POA: Diagnosis not present

## 2021-10-13 DIAGNOSIS — F419 Anxiety disorder, unspecified: Secondary | ICD-10-CM | POA: Diagnosis not present

## 2021-10-13 LAB — BASIC METABOLIC PANEL
Anion gap: 7 (ref 5–15)
BUN: 24 mg/dL — ABNORMAL HIGH (ref 8–23)
CO2: 32 mmol/L (ref 22–32)
Calcium: 8.2 mg/dL — ABNORMAL LOW (ref 8.9–10.3)
Chloride: 99 mmol/L (ref 98–111)
Creatinine, Ser: 0.37 mg/dL — ABNORMAL LOW (ref 0.44–1.00)
GFR, Estimated: >60 mL/min (ref >60)
Glucose, Bld: 106 mg/dL — ABNORMAL HIGH (ref 70–99)
Potassium: 4.1 mmol/L (ref 3.5–5.1)
Sodium: 138 mmol/L (ref 135–145)

## 2021-10-13 LAB — CBC
HCT: 30.4 % — ABNORMAL LOW (ref 36.0–46.0)
Hemoglobin: 10.1 g/dL — ABNORMAL LOW (ref 12.0–15.0)
MCH: 33 pg (ref 26.0–34.0)
MCHC: 33.2 g/dL (ref 30.0–36.0)
MCV: 99.3 fL (ref 80.0–100.0)
Platelets: 99 10*3/uL — ABNORMAL LOW (ref 150–400)
RBC: 3.06 MIL/uL — ABNORMAL LOW (ref 3.87–5.11)
RDW: 14.1 % (ref 11.5–15.5)
WBC: 12.7 10*3/uL — ABNORMAL HIGH (ref 4.0–10.5)
nRBC: 0 % (ref 0.0–0.2)

## 2021-10-13 LAB — GLUCOSE, CAPILLARY
Glucose-Capillary: 106 mg/dL — ABNORMAL HIGH (ref 70–99)
Glucose-Capillary: 103 mg/dL — ABNORMAL HIGH (ref 70–99)
Glucose-Capillary: 116 mg/dL — ABNORMAL HIGH (ref 70–99)
Glucose-Capillary: 123 mg/dL — ABNORMAL HIGH (ref 70–99)
Glucose-Capillary: 105 mg/dL — ABNORMAL HIGH (ref 70–99)
Glucose-Capillary: 93 mg/dL (ref 70–99)
Glucose-Capillary: 103 mg/dL — ABNORMAL HIGH (ref 70–99)

## 2021-10-13 MED ORDER — CLONAZEPAM 0.5 MG PO TABS
0.5000 mg | ORAL_TABLET | Freq: Two times a day (BID) | ORAL | Status: DC
  Administered 2021-10-13 – 2021-10-15 (×6): 0.5 mg
  Filled 2021-10-13 (×6): qty 1

## 2021-10-13 MED ORDER — CHOLESTYRAMINE 4 G PO PACK
4.0000 g | PACK | Freq: Three times a day (TID) | ORAL | Status: DC
  Administered 2021-10-13 – 2021-10-30 (×44): 4 g
  Filled 2021-10-13 (×56): qty 1

## 2021-10-13 NOTE — Progress Notes (Signed)
Speech Language Pathology Treatment: Nada Boozer Speaking valve  ?Patient Details ?Name: [name] ?MRN: [id number] ?DOB: 10/18/1955 ?Today's Date: 10/13/2021 ?Time: 1250-1303 ?SLP Time Calculation (min) (ACUTE ONLY): 13 min ? ?Assessment / Plan / Recommendation ?Clinical Impression ? Spoke with RN, Shea Stakes, who confirmed + potential for PMV trials today.  Ms. Pingel was resting quietly on ATC; awakened easily.  Baseline VS HR 87, Sp02 96, RR 36.  PMV placed for short intervals initially of 1-2 breath cycles.  There was no indication of air trapping. Pt was able to achieve reliable but hoarse phonation. She wore valve for approximately ten minutes with stable VS.  Stated her name, that she was in rehab, recited DOW and months of year with cues needing to increase volume.  She was sleepy but participatory. ? ?Recommend pt wear PMV when on TC and when she has supervision available, at least initially. SLP will follow for PMV use and swallow assessment when appropriate. ?  ?HPI HPI: Pt is a 66 yo female presenting to [facility] with acute hypoxic/hypercarbic respiratory failure requiring intubation 4/19 followed by transfer to [facility] after new finding of LV dysfunction. Trach 5/1. PMH includes: COPD, previously on home O2 but "took herself off," former smoker (quit 2021), uncontrolled anxiety ?  ?   ?SLP Plan ? Continue with current plan of care ? ?  ?  ?Recommendations for follow up therapy are one component of a multi-disciplinary discharge planning process, led by the attending physician.  Recommendations may be updated based on patient status, additional functional criteria and insurance authorization. ?  ? ?Recommendations  ?   ?   ? Patient may use Passy-Muir Speech Valve: During all therapies with supervision;Intermittently with supervision ?PMSV Supervision: Full  ?   ? ? ? ? Oral Care Recommendations: Oral care QID ?Follow Up Recommendations: Acute inpatient rehab (3hours/day) ?Assistance recommended at discharge: Frequent  or constant Supervision/Assistance ?SLP Visit Diagnosis: Aphonia (R49.1) ?Plan: Continue with current plan of care ? ? ? ? ?  ?  ?Aniah Pauli L. Encarnacion Scioneaux, MA CCC/SLP ?Acute Rehabilitation Services ?Office number [phone] ?Pager [phone] ? ? ?Juan Quam Laurice ? ?10/13/2021, 1:06 PM ?

## 2021-10-13 NOTE — Progress Notes (Signed)
RT removed pt from full ventilatory support and placed pt on ATC 10L 40%. RN currently at bedside and aware. RT will continue to monitor pt. ?

## 2021-10-13 NOTE — Progress Notes (Signed)
eLink Physician-Brief Progress Note ?Patient Name: [name] ?DOB: March 08, 1956 ?MRN: [id number] ? ? ?Date of Service ? 10/13/2021  ?HPI/Events of Note ? Loose stools leading to MASD,   ?eICU Interventions ? Plan: ?Questran 4 gm per tube now and TID.  ? ? ? ?Intervention Category ?Major Interventions: Other: ? ?Aziah Kaiser Dennard Nip ?10/13/2021, 8:56 PM ?

## 2021-10-13 NOTE — Progress Notes (Signed)
? ?NAME:  [name], MRN:  [id number], DOB:  1955-11-29, LOS: 61 ?ADMISSION DATE:  09/24/2021, CONSULTATION DATE:  4/18 ?REFERRING MD:  Tat/ triad, CHIEF COMPLAINT:  resp distress   ? ?History of Present Illness:  ?66 y.o. female quit smoking 2021 with  GOLD 3 COPD MZ, uncontrolled anxiety. Was on 02 one year PTA but "took herself off" per friend at bedside and able to care for pets at home but mostly housebound sinc ethen ? ?She was admitted 4/17 to [facility], ICU with acute hypoxic/hypercarbic respiratory failure, failed BiPAP and required mechanical ventilation, transferred to [facility] 4/21 due to new finding of LV dysfunction ? ?Significant Hospital Events: ?Including procedures, antibiotic start and stop dates in addition to other pertinent events   ?ET  4/18 c/b hypotension with severe air trapping  ?Echo 4/20 with EF 40% , akinesis of apex and septal wall ?4/24 CTA head negative for any significant abnormality ?4/25 ketamine added ?4/28 increased work of breathing during the wean, severe auto PEEP ?4/29 Febrile 101.2 ?5/1 perc trach placement ? ?Interim History / Subjective:  ? ?Working with SLP, using PMV with SLP supervision only ?ATC for approximately 10 hours on 5/5 ?WBC 12.7 ?I/O+ 24.5 L total for the hospitalization ? ?Objective   ?Blood pressure 90/62, pulse 76, temperature 98.6 ?F (37 ?C), temperature source Oral, resp. rate (!) 21, height 5\' 1"  (1.549 m), weight 53.7 kg, SpO2 100 %. ?   ?Vent Mode: PRVC ?FiO2 (%):  [40 %-60 %] 40 % ?Set Rate:  [20 bmp] 20 bmp ?Vt Set:  [430 mL] 430 mL ?PEEP:  [5 cmH20] 5 cmH20 ?Plateau Pressure:  [12 cmH20-19 cmH20] 19 cmH20  ? ?Intake/Output Summary (Last 24 hours) at 10/13/2021 0734 ?Last data filed at 10/13/2021 0700 ?Gross per 24 hour  ?Intake 3065 ml  ?Output 1150 ml  ?Net 1915 ml  ? ?Filed Weights  ? 10/09/21 0500 10/11/21 0447 10/13/21 0153  ?Weight: 55.2 kg 53.6 kg 53.7 kg  ? ? ?Examination: ?General appearance: 66 y.o., female, NAD ?Eyes: No gross abnormalities,  tracks appropriately ?HENT: Normocephalic, atraumatic. ?Neck: Trachea midline; no lymphadenopathy. No JVD ?Lungs: Normal respiratory effort, clear to ausculation bilaterally ?CV: Regular rate, rhythm. No murmurs appreciated. ?Abdomen: Soft, non-distended, non-tender. Normoactive bowel sounds.  ?Extremities: Trace dependent edema, warm ?Skin: Warm, normal turgor and texture; no rash ?Neuro: Opens eyes and follows commands. ? ?Assessment & Plan:  ? ?#Acute hypoxemic/hypercapnic respiratory failure ?#Tracheostomy with vent dependent respiratory failure ?#HAP klebsiella pneumonia ?#Resolving acute exacerbation of COPD ?-Continue ATC as she can tolerate, did 10 hours on 5/6 then back to MV overnight ?-Continue Brovana, Pulmicort nebs scheduled ?-Steroids completed 5/5 ?-Decreased Seroquel and oxycodone on 5/5 ?-Suspect she will require LTAC to continue recovery ? ?#Newly diagnosed heart failure w/ mildly reduced EF ?Echo earlier this admission revealed new drop in EF of 40% with septal apical akinesis concerning for ischemia. On ASA and low-dose beta blocker. Per cardiology, will start losartan today, other GDMT likely will need to be initiated as outpatient. Will also defer ischemic work-up for outpatient. Appears euvolemic on exam, weight & renal function stable.  ?-Losartan 25 mg daily ?-Aspirin ?-Schedule metoprolol 12.5 mg twice daily ?-Cardiology recommended ischemic evaluation as an outpatient ? ?#Acute metabolic encephalopathy - resolved ?#Severe anxiety ?No longer requiring Precedex, will continue with Klonopin. Will plan to start tapering quetiapine. ?-Seroquel and oxycodone decreased on 5/5 ?-Consider decrease clonazepam on 5/6 >> to 0.5mg  bid ? ?Thrombocytopenia ?Unclear source, possibly due to acute  illness. 90 this AM, stable. No signs or symptoms of bleeding. ?-Follow intermittent CBC ? ?Goals of care ?-Likely headed for LTAC to continue recovery ?-Suspect she will require PEG placement ? ? ?Best Practice  (right click and "Reselect all SmartList Selections" daily)  ? ?Diet/type: tubefeeds  ?DVT prophylaxis: LMWH  ?GI prophylaxis: PPI ?Lines: N/A ?Foley:  N/A ?Code Status:  full code ?Last date of multidisciplinary goals of care discussion [family updated bedside 5/5] ? ? ?Independent CC time: NA ? ?Baltazar Apo, MD, PhD ?10/13/2021, 7:40 AM ?[facility] ?[phone] or if no answer before 7:00PM call [phone] ?For any issues after 7:00PM please call eLink [phone] ? ? ? ?

## 2021-10-14 DIAGNOSIS — G9340 Encephalopathy, unspecified: Secondary | ICD-10-CM | POA: Diagnosis not present

## 2021-10-14 DIAGNOSIS — J9601 Acute respiratory failure with hypoxia: Secondary | ICD-10-CM | POA: Diagnosis not present

## 2021-10-14 DIAGNOSIS — J9602 Acute respiratory failure with hypercapnia: Secondary | ICD-10-CM | POA: Diagnosis not present

## 2021-10-14 LAB — GLUCOSE, CAPILLARY
Glucose-Capillary: 119 mg/dL — ABNORMAL HIGH (ref 70–99)
Glucose-Capillary: 113 mg/dL — ABNORMAL HIGH (ref 70–99)
Glucose-Capillary: 149 mg/dL — ABNORMAL HIGH (ref 70–99)
Glucose-Capillary: 99 mg/dL (ref 70–99)
Glucose-Capillary: 125 mg/dL — ABNORMAL HIGH (ref 70–99)
Glucose-Capillary: 121 mg/dL — ABNORMAL HIGH (ref 70–99)

## 2021-10-14 MED ORDER — GUANFACINE HCL 1 MG PO TABS
0.5000 mg | ORAL_TABLET | Freq: Two times a day (BID) | ORAL | Status: AC
  Administered 2021-10-14 – 2021-10-16 (×5): 0.5 mg
  Filled 2021-10-14 (×5): qty 1

## 2021-10-14 NOTE — Progress Notes (Signed)
RT removed pt from full ventilatory support to ATC 5L 28%. Pt tolerating well at this time with SVS. RT will continue to monitor pt. ?

## 2021-10-14 NOTE — Progress Notes (Signed)
Patient is intermittently hypotensive.  We will discontinue losartan and metoprolol for now.  We will try to resume later as blood pressure allows.  As previously will plan further cardiac work-up if she improves from her present illness. ?Olga Millers ? ?

## 2021-10-14 NOTE — Progress Notes (Signed)
? ?NAME:  [name], MRN:  [id number], DOB:  1955-08-25, LOS: 83 ?ADMISSION DATE:  09/24/2021, CONSULTATION DATE:  4/18 ?REFERRING MD:  Tat/ triad, CHIEF COMPLAINT:  resp distress   ? ?History of Present Illness:  ?66 y.o. female quit smoking 2021 with  GOLD 3 COPD MZ, uncontrolled anxiety. Was on 02 one year PTA but "took herself off" per friend at bedside and able to care for pets at home but mostly housebound sinc ethen ? ?She was admitted 4/17 to [facility], ICU with acute hypoxic/hypercarbic respiratory failure, failed BiPAP and required mechanical ventilation, transferred to [facility] 4/21 due to new finding of LV dysfunction ? ?Significant Hospital Events: ?Including procedures, antibiotic start and stop dates in addition to other pertinent events   ?ET  4/18 c/b hypotension with severe air trapping  ?Echo 4/20 with EF 40% , akinesis of apex and septal wall ?4/24 CTA head negative for any significant abnormality ?4/25 ketamine added ?4/28 increased work of breathing during the wean, severe auto PEEP ?4/29 Febrile 101.2 ?5/1 perc trach placement ? ?Interim History / Subjective:  ? ?She has done ATC for 10 hours for the last 2 days, back on ATC now.  Did vent overnight ?Working with SLP, okay for PMV with their supervision only ?+26.9 L total for the hospitalization ? ? ?Objective   ?Blood pressure (!) 81/53, pulse 83, temperature (!) 97.5 ?F (36.4 ?C), temperature source Axillary, resp. rate (!) 28, height 5\' 1"  (1.549 m), weight 53.7 kg, SpO2 96 %. ?   ?Vent Mode: PRVC ?FiO2 (%):  [28 %-40 %] 28 % ?Set Rate:  [20 bmp] 20 bmp ?Vt Set:  [430 mL] 430 mL ?PEEP:  [5 cmH20] 5 cmH20 ?Plateau Pressure:  [15 cmH20] 15 cmH20  ? ?Intake/Output Summary (Last 24 hours) at 10/14/2021 0839 ?Last data filed at 10/14/2021 0600 ?Gross per 24 hour  ?Intake 2525 ml  ?Output 425 ml  ?Net 2100 ml  ? ?Filed Weights  ? 10/11/21 0447 10/13/21 0153 10/14/21 0437  ?Weight: 53.6 kg 53.7 kg 53.7 kg  ? ? ?Examination: ?General appearance: Thin  ill-appearing woman, ?Eyes: No gross abnormalities, tracks appropriately ?HENT: Normocephalic, atraumatic. ?Neck: Trach in place, no exudate, no bleeding ?Lungs: Normal respiratory effort, clear to ausculation bilaterally ?CV: Regular rate, rhythm. No murmurs appreciated. ?Abdomen: Soft, non-distended, non-tender. Normoactive bowel sounds.  ?Extremities: Trace dependent edema, warm ?Skin: Warm, normal turgor and texture; no rash ?Neuro: Opens eyes and follows commands. ? ?Assessment & Plan:  ? ?#Acute hypoxemic/hypercapnic respiratory failure ?#Tracheostomy with vent dependent respiratory failure ?#HAP klebsiella pneumonia ?#Resolving acute exacerbation of COPD ?-She has tolerated 10 hours ATC for the last 2 days.  Continue to push weaning efforts.  May be able to start using PSV as her rest mode. ?-Will benefit significantly from LTAC when her insurance approves it.  Goal will be vent freedom, decannulation. ?-Continue Brovana, Pulmicort nebs scheduled ?-Systemic corticosteroids completed ?-Have been slowly decreasing sedating medications: Seroquel and oxycodone decreased on 5/5, clonazepam decreased on 5/6.  Consider further weaning this week ?-Out of bed today she can tolerate with assistance ? ?#Newly diagnosed heart failure w/ mildly reduced EF ?Echo earlier this admission revealed new drop in EF of 40% with septal apical akinesis concerning for ischemia. On ASA and low-dose beta blocker. Per cardiology, will start losartan today, other GDMT likely will need to be initiated as outpatient. Will also defer ischemic work-up for outpatient. Appears euvolemic on exam, weight & renal function stable.  ?-Appreciate cardiology assistance.  She will need an ischemic work-up once stabilized from these acute events. ?-Her losartan and metoprolol were stopped on 5/7 because she has been having intermittent bouts of hypotension ?-Continue aspirin ? ?#Acute metabolic encephalopathy - resolved ?#Severe anxiety ?-Seroquel and  oxycodone decreased on 5/5 ?-Klonopin decreased on 5/6 ? ?Thrombocytopenia ?Unclear source, possibly due to acute illness. 90 this AM, stable. No signs or symptoms of bleeding. ?-follow CBC ? ?Goals of care ?-Headed for LTAC ?-Suspect she will require PEG placement but working with SLP, possibly will be able to take p.o. diet ? ? ?Best Practice (right click and "Reselect all SmartList Selections" daily)  ? ?Diet/type: tubefeeds  ?DVT prophylaxis: LMWH  ?GI prophylaxis: PPI ?Lines: N/A ?Foley:  N/A ?Code Status:  full code ?Last date of multidisciplinary goals of care discussion [family updated bedside 5/5] ? ? ?Independent CC time: NA ? ?Baltazar Apo, MD, PhD ?10/14/2021, 8:39 AM ?[facility] ?[phone] or if no answer before 7:00PM call [phone] ?For any issues after 7:00PM please call eLink [phone] ? ? ? ?

## 2021-10-15 DIAGNOSIS — I255 Ischemic cardiomyopathy: Secondary | ICD-10-CM

## 2021-10-15 DIAGNOSIS — R5381 Other malaise: Secondary | ICD-10-CM | POA: Diagnosis not present

## 2021-10-15 DIAGNOSIS — J9602 Acute respiratory failure with hypercapnia: Secondary | ICD-10-CM | POA: Diagnosis not present

## 2021-10-15 DIAGNOSIS — Z930 Tracheostomy status: Secondary | ICD-10-CM

## 2021-10-15 DIAGNOSIS — I471 Supraventricular tachycardia: Secondary | ICD-10-CM

## 2021-10-15 DIAGNOSIS — J9601 Acute respiratory failure with hypoxia: Secondary | ICD-10-CM | POA: Diagnosis not present

## 2021-10-15 LAB — GLUCOSE, CAPILLARY
Glucose-Capillary: 113 mg/dL — ABNORMAL HIGH (ref 70–99)
Glucose-Capillary: 114 mg/dL — ABNORMAL HIGH (ref 70–99)
Glucose-Capillary: 128 mg/dL — ABNORMAL HIGH (ref 70–99)
Glucose-Capillary: 128 mg/dL — ABNORMAL HIGH (ref 70–99)
Glucose-Capillary: 89 mg/dL (ref 70–99)
Glucose-Capillary: 97 mg/dL (ref 70–99)

## 2021-10-15 LAB — CBC
HCT: 31.3 % — ABNORMAL LOW (ref 36.0–46.0)
Hemoglobin: 10.1 g/dL — ABNORMAL LOW (ref 12.0–15.0)
MCH: 32.6 pg (ref 26.0–34.0)
MCHC: 32.3 g/dL (ref 30.0–36.0)
MCV: 101 fL — ABNORMAL HIGH (ref 80.0–100.0)
Platelets: 104 10*3/uL — ABNORMAL LOW (ref 150–400)
RBC: 3.1 MIL/uL — ABNORMAL LOW (ref 3.87–5.11)
RDW: 14.4 % (ref 11.5–15.5)
WBC: 7.5 10*3/uL (ref 4.0–10.5)
nRBC: 0 % (ref 0.0–0.2)

## 2021-10-15 LAB — BASIC METABOLIC PANEL
Anion gap: 4 — ABNORMAL LOW (ref 5–15)
BUN: 20 mg/dL (ref 8–23)
CO2: 29 mmol/L (ref 22–32)
Calcium: 8.2 mg/dL — ABNORMAL LOW (ref 8.9–10.3)
Chloride: 105 mmol/L (ref 98–111)
Creatinine, Ser: 0.41 mg/dL — ABNORMAL LOW (ref 0.44–1.00)
GFR, Estimated: >60 mL/min (ref >60)
Glucose, Bld: 120 mg/dL — ABNORMAL HIGH (ref 70–99)
Potassium: 4.5 mmol/L (ref 3.5–5.1)
Sodium: 138 mmol/L (ref 135–145)

## 2021-10-15 MED ORDER — FUROSEMIDE 10 MG/ML IJ SOLN
INTRAMUSCULAR | Status: AC
  Filled 2021-10-15: qty 4

## 2021-10-15 MED ORDER — QUETIAPINE FUMARATE 25 MG PO TABS
25.0000 mg | ORAL_TABLET | Freq: Two times a day (BID) | ORAL | Status: DC
  Administered 2021-10-15: 25 mg
  Filled 2021-10-15: qty 1

## 2021-10-15 MED ORDER — CHLORHEXIDINE GLUCONATE CLOTH 2 % EX PADS: 6.0000 | MEDICATED_PAD | Freq: Every day | CUTANEOUS | Status: DC

## 2021-10-15 MED ORDER — OXYCODONE HCL 5 MG PO TABS: 5.0000 mg | ORAL_TABLET | Freq: Four times a day (QID) | ORAL | Status: DC | PRN

## 2021-10-15 MED ORDER — FUROSEMIDE 10 MG/ML IJ SOLN
40.0000 mg | Freq: Once | INTRAMUSCULAR | Status: AC
  Administered 2021-10-15: 40 mg via INTRAVENOUS
  Filled 2021-10-15: qty 4

## 2021-10-15 MED ORDER — OXYCODONE HCL 5 MG PO TABS
5.0000 mg | ORAL_TABLET | Freq: Three times a day (TID) | ORAL | Status: DC
  Administered 2021-10-15 – 2021-11-06 (×62): 5 mg
  Filled 2021-10-15 (×64): qty 1

## 2021-10-15 MED ORDER — FUROSEMIDE 40 MG PO TABS: 40.0000 mg | ORAL_TABLET | Freq: Every day | ORAL | Status: DC

## 2021-10-15 MED ORDER — CHLORHEXIDINE GLUCONATE CLOTH 2 % EX PADS
6.0000 | MEDICATED_PAD | CUTANEOUS | Status: DC
  Administered 2021-10-15 – 2021-10-31 (×17): 6 via TOPICAL

## 2021-10-15 NOTE — Progress Notes (Signed)
Attempted to call patients sister, Ros. No answer. Talked to her last night and she informed me that she would be here sometime this morning. I told patient this again. She just rolled her eyes. Patient lying in bed resting now and seems somewhat calm.  ?

## 2021-10-15 NOTE — Progress Notes (Signed)
? ?NAME:  [name], MRN:  [id number], DOB:  July 28, 1955, LOS: 64 ?ADMISSION DATE:  09/24/2021, CONSULTATION DATE:  4/18 ?REFERRING MD:  Tat/ triad, CHIEF COMPLAINT:  resp distress   ? ?History of Present Illness:  ?66 y.o. female quit smoking 2021 with  GOLD 3 COPD MZ, uncontrolled anxiety. Was on 02 one year PTA but "took herself off" per friend at bedside and able to care for pets at home but mostly housebound sinc ethen ? ?She was admitted 4/17 to [facility], ICU with acute hypoxic/hypercarbic respiratory failure, failed BiPAP and required mechanical ventilation, transferred to [facility] 4/21 due to new finding of LV dysfunction. ? ?Significant Hospital Events: ?Including procedures, antibiotic start and stop dates in addition to other pertinent events   ?ET  4/18 c/b hypotension with severe air trapping  ?Echo 4/20 with EF 40% , akinesis of apex and septal wall ?4/24 CTA head negative for any significant abnormality ?4/25 ketamine added ?4/28 increased work of breathing during the wean, severe auto PEEP ?4/29 Febrile 101.2 ?5/1 perc trach placement ? ?Interim History / Subjective:  ? ?No acute events overnight. Has been off mechanical ventilation since yesterday morning, sating well.  ? ?Objective   ?Blood pressure 103/63, pulse 88, temperature 98.2 ?F (36.8 ?C), temperature source Axillary, resp. rate (!) 25, height 5\' 1"  (1.549 m), weight 53.6 kg, SpO2 96 %. ?   ?Vent Mode: Stand-by ?FiO2 (%):  [28 %] 28 %  ? ?Intake/Output Summary (Last 24 hours) at 10/15/2021 [id number] ?Last data filed at 10/15/2021 0700 ?Gross per 24 hour  ?Intake 3055 ml  ?Output 675 ml  ?Net 2380 ml  ? ? ?Filed Weights  ? 10/13/21 0153 10/14/21 0437 10/15/21 0322  ?Weight: 53.7 kg 53.7 kg 53.6 kg  ? ?Examination: ?General appearance: Resting in bed on trach collar, no acute distress ?Eyes: No gross abnormalities, tracks appropriately. Anicteric sclerae. ?HENT: Normocephalic, atraumatic. ?Neck: Trach collar in place, no exudate, no bleeding ?Lungs:  Normal respiratory effort. Clear to ausculation bilaterally ?CV: Regular rate, rhythm. No murmurs appreciated. ?Abdomen: Soft, non-distended, non-tender. Normoactive bowel sounds.  ?Extremities: Trace dependent edema, warm ?Skin: Warm, normal turgor and texture; no rash ?Neuro: Opens eyes and follows commands. ? ?Assessment & Plan:  ? ?#Acute hypoxemic/hypercapnic respiratory failure ?#Tracheostomy with vent dependent respiratory failure ?#HAP Klebsiella pneumonia - resolved ?#Resolving acute exacerbation of COPD - resolved ?Over last 24h, patient has tolerated being on trach collar well without issues. Continues to be hemodynamically stable, lungs clear on exam. SLP introduced PMSV over the weekend, will use with supervision. Will plan for LTACH Ec[facility]) pending insurance. Will continue with bronchodilators, therapy. ?- Continue trach collar as tolerated ?- Pending insurance approval for LTACH ?- Brovana, Pulmicort nebulizers  ?- Levalbuterol as needed ?- Continue PT/OT/SLP ? ?#Newly diagnosed heart failure w/ mildly reduced EF ?Echo earlier this admission revealed new drop in EF of 40% with septal apical akinesis concerning for ischemia. On ASA and low-dose beta blocker. Did start ARB last week, but did become hypotensive yesterday morning  - ARB and beta blocker were held. Currently she appears euvolemic, weight is stable. BP soft, MAP's in low 70's. Will defer to cardiology regarding medication management. Platelets stable on aspirin. Plan for outpatient ischemic eval. ?- Holding losartan and metoprolol - add back per cards ?- Continue ASA 81mg  daily ?- Outpatient ischemic evaluation ? ?#Acute metabolic encephalopathy - resolved ?#Severe anxiety ?We have been slowly weaning down on centrally-acting medications. Last decreased seroquel and oxycodone three days ago.  Will further down-titrate today. ?- Decrease Seroquel 25mg  BID ?- Oxycodone 5mg  every 6 hours as needed ?- Klonopin 0.5mg  twice daily (decreased  5/6) ? ?#Thrombocytopenia ?Unclear source, possibly due to acute illness. 100 his AM, stable. No signs or symptoms of bleeding. ?-Follow CBC ? ?#Severe protein-calorie malnutrition ?Getting tube feeds, electrolytes are stable. Will continue to monitor.  ? ? ?Best Practice (right click and "Reselect all SmartList Selections" daily)  ? ?Diet/type: tubefeeds  ?DVT prophylaxis: LMWH  ?GI prophylaxis: PPI ?Lines: N/A ?Foley:  N/A ?Code Status:  full code ?Last date of multidisciplinary goals of care discussion [family updated bedside 5/5] ? ?Independent CC time: 20 minutes ? ?Sanjuan Dame, MD ?Internal Medicine PGY-2 ?Pager: [phone] ? ? ? ?

## 2021-10-15 NOTE — Progress Notes (Signed)
Physical Therapy Treatment ?Patient Details ?Name: [name] ?MRN: [id number] ?DOB: [birth year] ?Today's Date: 10/15/2021 ? ? ?History of Present Illness 66 y/o F reports diagnosis of PNA on 4/16 Admitted 4/17 to [facility], ICU with acute hypoxic/hypercarbic respiratory failure, failed BiPAP and required mechanical ventilation, transferred to [facility] 4/21 due to new finding of LV dysfunction Echo 4/20 with EF 40% , akinesis of apex and septal wall. . Failed weaning attempt 4/28. PMH significant for COPD, tobacco use, HLD, GRED, and uncontrolled anxiety. ? ?  ?PT Comments  ? ? Pt tolerated sitting EOB and transfer to the recliner but able to assist very little due to weakness and rapid fatigue. Respiratory status was stable throughout on trach collar. Expect rehab will be slow and steady to regain mobility and if medical status continues to improve.    ?Recommendations for follow up therapy are one component of a multi-disciplinary discharge planning process, led by the attending physician.  Recommendations may be updated based on patient status, additional functional criteria and insurance authorization. ? ?Follow Up Recommendations ? Skilled nursing-short term rehab (<3 hours/day) ?  ?  ?Assistance Recommended at Discharge Frequent or constant Supervision/Assistance  ?Patient can return home with the following Two people to help with walking and/or transfers;Two people to help with bathing/dressing/bathroom;Assist for transportation;Help with stairs or ramp for entrance ?  ?Equipment Recommendations ? Rolling walker (2 wheels);BSC/3in1;Wheelchair (measurements PT);Wheelchair cushion (measurements PT);Other (comment) (hoyer lift)  ?  ?Recommendations for Other Services   ? ? ?  ?Precautions / Restrictions Precautions ?Precautions: Fall ?Precaution Comments: trach (vent vs trach collar), NG tube ?Restrictions ?Weight Bearing Restrictions: No  ?  ? ?Mobility ? Bed Mobility ?Overal bed mobility: Needs Assistance ?Bed  Mobility: Rolling, Sidelying to Sit ?Rolling: Max assist, +2 for physical assistance ?Sidelying to sit: Max assist, +2 for physical assistance ?  ?  ?  ?General bed mobility comments: Max A +2 for rolling to L and then elevating trunk and lowering BLEs. ?  ? ?Transfers ?Overall transfer level: Needs assistance ?Equipment used: None ?Transfers: Bed to chair/wheelchair/BSC ?  ?  ?  ?  ?  ? Lateral/Scoot Transfers: Total assist, +2 physical assistance ?General transfer comment: Assist for all aspects of total A +2 for lateral scoot to drop arm recliner ?  ? ?Ambulation/Gait ?  ?  ?  ?  ?  ?  ?  ?  ? ? ?Stairs ?  ?  ?  ?  ?  ? ? ?Wheelchair Mobility ?  ? ?Modified Rankin (Stroke Patients Only) ?  ? ? ?  ?Balance Overall balance assessment: Needs assistance ?Sitting-balance support: Feet unsupported, Bilateral upper extremity supported ?Sitting balance-Leahy Scale: Zero ?Sitting balance - Comments: pt requires Max A posteriorly to maintain balance ?Postural control: Posterior lean ?  ?  ?  ?  ?  ?  ?  ?  ?  ?  ?  ?  ?  ?  ?  ? ?  ?Cognition Arousal/Alertness: Awake/alert ?Behavior During Therapy: Flat affect ?Overall Cognitive Status: Difficult to assess ?  ?  ?  ?  ?  ?  ?  ?  ?  ?  ?  ?  ?  ?  ?  ?  ?General Comments: Follow simple commands. fatigues quickly ?  ?  ? ?  ?Exercises General Exercises - Lower Extremity ?Heel Slides: AAROM, Both, 10 reps, Supine ?Hip ABduction/ADduction: AAROM, Both, 10 reps, Supine ? ?  ?General Comments General comments (skin integrity, edema,  etc.): VSS on vent via trach. PMV on during session. ?  ?  ? ?Pertinent Vitals/Pain Pain Assessment ?Pain Assessment: Faces ?Faces Pain Scale: Hurts a little bit ?Pain Location: generalized with movement ?Pain Descriptors / Indicators: Grimacing, Guarding ?Pain Intervention(s): Monitored during session, Limited activity within patient's tolerance, Repositioned  ? ? ?Home Living   ?  ?  ?  ?  ?  ?  ?  ?  ?  ?   ?  ?Prior Function    ?  ?  ?   ? ?PT  Goals (current goals can now be found in the care plan section) Progress towards PT goals: Progressing toward goals ? ?  ?Frequency ? ? ? Min 3X/week ? ? ? ?  ?PT Plan Current plan remains appropriate  ? ? ?Co-evaluation PT/OT/SLP Co-Evaluation/Treatment: Yes ?Reason for Co-Treatment: Complexity of the patient's impairments (multi-system involvement);For patient/therapist safety ?PT goals addressed during session: Mobility/safety with mobility;Balance ?  ?  ? ?  ?AM-PAC PT "6 Clicks" Mobility   ?Outcome Measure ? Help needed turning from your back to your side while in a flat bed without using bedrails?: Total ?Help needed moving from lying on your back to sitting on the side of a flat bed without using bedrails?: Total ?Help needed moving to and from a bed to a chair (including a wheelchair)?: Total ?Help needed standing up from a chair using your arms (e.g., wheelchair or bedside chair)?: Total ?Help needed to walk in hospital room?: Total ?Help needed climbing 3-5 steps with a railing? : Total ?6 Click Score: 6 ? ?  ?End of Session Equipment Utilized During Treatment: Oxygen ?Activity Tolerance: Patient limited by fatigue ?Patient left: in chair;with call bell/phone within reach;with chair alarm set ?Nurse Communication: Mobility status;Need for lift equipment ?PT Visit Diagnosis: Other abnormalities of gait and mobility (R26.89);Muscle weakness (generalized) (M62.81);Difficulty in walking, not elsewhere classified (R26.2) ?Pain - part of body:  (generalized) ?  ? ? ?Time: [id number] ?PT Time Calculation (min) (ACUTE ONLY): 28 min ? ?Charges:  $Therapeutic Activity: 8-22 mins          ?          ? ?[facility] PT ?Acute Rehabilitation Services ?Office [phone] ? ? ? ?Shary Decamp [facility] ?10/15/2021, 4:42 PM ? ?

## 2021-10-15 NOTE — Evaluation (Signed)
Clinical/Bedside Swallow Evaluation ?Patient Details  ?Name: [name] ?MRN: [id number] ?Date of Birth: 21-Jun-1955 ? ?Today's Date: 10/15/2021 ?Time: SLP Start Time (ACUTE ONLY): 1112 SLP Stop Time (ACUTE ONLY): 1131 ?SLP Time Calculation (min) (ACUTE ONLY): 19 min ? ?Past Medical History:  ?Past Medical History:  ?Diagnosis Date  ? Anxiety   ? Chronic back pain   ? COPD (chronic obstructive pulmonary disease) (HCC)   ? Tobacco abuse   ? ?Past Surgical History:  ?Past Surgical History:  ?Procedure Laterality Date  ? ABDOMINAL HYSTERECTOMY    ? BACK SURGERY    ? ILEO LOOP NEOBLADDER    ? VESICOVAGINAL FISTULA REPAIR    ? ?HPI:  ?Pt is a 66 yo female presenting to [facility] with acute hypoxic/hypercarbic respiratory failure requiring intubation 4/19 followed by transfer to [facility] after new finding of LV dysfunction. Trach 5/1. PMH includes: COPD, previously on home O2 but "took herself off," former smoker (quit 2021), uncontrolled anxiety  ?  ?Assessment / Plan / Recommendation  ?Clinical Impression ? Pt has generalized weakness noted on oral motor exam and reports feeling tired today, so is agreeable only to a few PO trials. Her oral phase appears to be appropriate with ice chips. Pharyngeally, she seems to elicit a swallow responses consistently, although the intergrity of her swallowing cannot be assessed without further instrumental testing, particularly with coughing noted throughout test that may be suggestive of reduce secretion management. For today, would start ice chips with RN only: 1 or 2 after oral care is performed and with PMV in place, in order to introduce more moisture and allow her to start using her musculature a little more. Will f/u for readiness to complete instrumental testing. ?SLP Visit Diagnosis: Dysphagia, unspecified (R13.10) ?   ?Aspiration Risk ? Moderate aspiration risk  ?  ?Diet Recommendation NPO;Alternative means - temporary;Other (Comment) (1 or 2 ice chips from RN after oral care, with PMV  in place)  ? ?Medication Administration: Via alternative means  ?  ?Other  Recommendations Oral Care Recommendations: Oral care QID ?Other Recommendations: Have oral suction available;Place PMSV during PO intake   ? ?Recommendations for follow up therapy are one component of a multi-disciplinary discharge planning process, led by the attending physician.  Recommendations may be updated based on patient status, additional functional criteria and insurance authorization. ? ?Follow up Recommendations Acute inpatient rehab (3hours/day)  ? ? ?  ?Assistance Recommended at Discharge Frequent or constant Supervision/Assistance  ?Functional Status Assessment Patient has had a recent decline in their functional status and demonstrates the ability to make significant improvements in function in a reasonable and predictable amount of time.  ?Frequency and Duration min 2x/week  ?2 weeks ?  ?   ? ?Prognosis Prognosis for Safe Diet Advancement: Good  ? ?  ? ?Swallow Study   ?General HPI: Pt is a 66 yo female presenting to [facility] with acute hypoxic/hypercarbic respiratory failure requiring intubation 4/19 followed by transfer to [facility] after new finding of LV dysfunction. Trach 5/1. PMH includes: COPD, previously on home O2 but "took herself off," former smoker (quit 2021), uncontrolled anxiety ?Type of Study: Bedside Swallow Evaluation ?Previous Swallow Assessment: none in chart ?Diet Prior to this Study: NPO;NG Tube ?Temperature Spikes Noted: No ?Respiratory Status: Trach;Trach Collar ?Trach Size and Type: Cuff;#6;Deflated;With PMSV in place ?History of Recent Intubation: Yes ?Length of Intubations (days): 12 days ?Date extubated:  (trach 5/1) ?Behavior/Cognition: Lethargic/Drowsy;Cooperative ?Oral Cavity Assessment: Dry ?Oral Care Completed by SLP: Yes ?Oral Cavity - Dentition:  Adequate natural dentition ?Self-Feeding Abilities: Needs assist ?Patient Positioning: Upright in bed ?Baseline Vocal Quality: Hoarse;Low vocal  intensity ?Volitional Cough: Strong;Congested ?Volitional Swallow: Able to elicit  ?  ?Oral/Motor/Sensory Function Overall Oral Motor/Sensory Function: Generalized oral weakness   ?Ice Chips Ice chips: Impaired ?Presentation: Spoon ?Pharyngeal Phase Impairments: Throat Clearing - Delayed;Cough - Delayed   ?Thin Liquid Thin Liquid: Not tested  ?  ?Nectar Thick Nectar Thick Liquid: Not tested   ?Honey Thick Honey Thick Liquid: Not tested   ?Puree Puree: Not tested   ?Solid ? ? ?  Solid: Not tested  ? ?  ? ?Mahala Menghini., M.A. CCC-SLP ?Acute Rehabilitation Services ?Office [phone] ? ?Secure chat preferred ? ?10/15/2021,2:58 PM ? ? ? ?

## 2021-10-15 NOTE — Progress Notes (Signed)
Pt very anxious at the moment and asking for a breathing treatment and asking to call her sister. Pt states she "is just scared and wants someone to come sit with her". I explained to her about her breathing and we would try to call her sister but we needed her to calm down and not be scared, that we are monitoring her and with her and do not want her to get worked up to have to go back on the breathing machine. Pt nods head in agreement and RR calmed and WOB calmed at the moment.  ?

## 2021-10-15 NOTE — Progress Notes (Signed)
RT removed tracheal sutures. Pt tolerated well. ?

## 2021-10-15 NOTE — Progress Notes (Signed)
Speech Language Pathology Treatment: [name] Speaking valve  ?Patient Details ?Name: [name] ?MRN: [id number] ?DOB: 1955-07-26 ?Today's Date: 10/15/2021 ?Time: 8657-8469 ?SLP Time Calculation (min) (ACUTE ONLY): 19 min ? ?Assessment / Plan / Recommendation ?Clinical Impression ? Pt did well with PMV trial again today. She is sleepy, saying that she got very little sleep overnight because of feeling so anxious, especially when people can't understand her. She does say that using the valve helps her feel a little better, as she can communicate more readily. VS remained stable during trial today although with RR a little high even at baseline (around 30). She tried to cough intermittently and although she said she thought she was clearing her throat, she never expectorated anything. Valve was removed at the end of the session as she was wanting to rest. Would encourage staff to use valve during interactions with pt to help her communicate as much as possible. She also benefits from cues for increased volume to facilitate her intelligibility. Would continue to use valve when full supervision can be provided by staff. ?  ?HPI HPI: Pt is a 66 yo female presenting to [facility] with acute hypoxic/hypercarbic respiratory failure requiring intubation 4/19 followed by transfer to [facility] after new finding of LV dysfunction. Trach 5/1. PMH includes: COPD, previously on home O2 but "took herself off," former smoker (quit 2021), uncontrolled anxiety ?  ?   ?SLP Plan ? Continue with current plan of care ? ?  ?  ?Recommendations for follow up therapy are one component of a multi-disciplinary discharge planning process, led by the attending physician.  Recommendations may be updated based on patient status, additional functional criteria and insurance authorization. ?  ? ?Recommendations  ?   ?   ? Patient may use Passy-Muir Speech Valve: During all therapies with supervision;Intermittently with supervision ?PMSV Supervision: Full ?MD:  Please consider changing trach tube to : Cuffless (if continues to stay off the vent)  ?   ? ? ? ? Oral Care Recommendations: Oral care QID ?Follow Up Recommendations: Acute inpatient rehab (3hours/day) ?Assistance recommended at discharge: Frequent or constant Supervision/Assistance ?SLP Visit Diagnosis: Aphonia (R49.1) ?Plan: Continue with current plan of care ? ? ? ? ?  ?  ? ? ?Mahala Menghini., M.A. CCC-SLP ?Acute Rehabilitation Services ?Office [phone] ? ?Secure chat preferred ? ? ?10/15/2021, 2:40 PM ?

## 2021-10-15 NOTE — Progress Notes (Signed)
Occupational Therapy Treatment ?Patient Details ?Name: [name] ?MRN: [id number] ?DOB: 10/04/1955 ?Today's Date: 10/15/2021 ? ? ?History of present illness 66 y/o F reports diagnosis of PNA on 4/16 Admitted [facility] to [facility], ICU with acute hypoxic/hypercarbic respiratory failure, failed BiPAP and required mechanical ventilation, [facility] due to new finding of LV dysfunction Echo 4/20 with EF 40% , akinesis of apex and septal wall. . Failed weaning attempt 4/28. PMH significant for COPD, tobacco use, HLD, GRED, and uncontrolled anxiety. ?  ?OT comments ? Pt continues to present with decreased balance, strength, and activity tolerance. Pt requiring Max A +2 for bed mobility and then Total A for maintaining sitting balance due to fatigue. Pt requiring Total A +2 for lateral scoot to drop arm recliner. Continue to recommend dc to post-acute rehab and will continue to follow acutely as admitted.   ? ?Recommendations for follow up therapy are one component of a multi-disciplinary discharge planning process, led by the attending physician.  Recommendations may be updated based on patient status, additional functional criteria and insurance authorization. ?   ?Follow Up Recommendations ? OT at Long-term acute care hospital  ?  ?Assistance Recommended at Discharge Frequent or constant Supervision/Assistance  ?Patient can return home with the following ? Two people to help with walking and/or transfers;Two people to help with bathing/dressing/bathroom;Assistance with cooking/housework;Assistance with feeding;Direct supervision/assist for medications management;Direct supervision/assist for financial management;Assist for transportation;Help with stairs or ramp for entrance ?  ?Equipment Recommendations ? Other (comment) (TBD)  ?  ?Recommendations for Other Services   ? ?  ?Precautions / Restrictions Precautions ?Precautions: Fall ?Precaution Comments: trach (vent vs trach collar), NG  tube ?Restrictions ?Weight Bearing Restrictions: No  ? ? ?  ? ?Mobility Bed Mobility ?Overal bed mobility: Needs Assistance ?Bed Mobility: Rolling, Sidelying to Sit ?Rolling: Max assist, +2 for physical assistance ?Sidelying to sit: Max assist, +2 for physical assistance ?  ?  ?  ?General bed mobility comments: Max A +2 for rolling to L and then elevating trunk and lowering BLEs. ?  ? ?Transfers ?Overall transfer level: Needs assistance ?  ?Transfers: Bed to chair/wheelchair/BSC ?  ?  ?  ?  ?  ? Lateral/Scoot Transfers: Total assist, +2 physical assistance ?General transfer comment: Total A +2 for lateral scoot to drop arm recliner ?  ?  ?Balance Overall balance assessment: Needs assistance ?Sitting-balance support: Feet unsupported, Bilateral upper extremity supported ?Sitting balance-Leahy Scale: Zero ?Sitting balance - Comments: pt requires Max A posteriorly to maintain balance ?Postural control: Posterior lean ?  ?  ?  ?  ?  ?  ?  ?  ?  ?  ?  ?  ?  ?  ?   ? ?ADL either performed or assessed with clinical judgement  ? ?ADL Overall ADL's : Needs assistance/impaired ?  ?  ?  ?  ?  ?  ?  ?  ?  ?  ?  ?Lower Body Dressing Details (indicate cue type and reason): Total A for donning socks ?  ?  ?  ?  ?  ?  ?  ?General ADL Comments: Total assist + 1-2 for all ADL's. ?  ? ?Extremity/Trunk Assessment Upper Extremity Assessment ?Upper Extremity Assessment: RUE deficits/detail;LUE deficits/detail ?RUE Deficits / Details: Minimal strength, unable to lift against gravity, PROM limited due to bed position and edema, grip weak with minimal thumb mobility. ?RUE Coordination: decreased fine motor;decreased gross motor ?LUE Deficits / Details: Minimal strength, unable to lift  against gravity, PROM limited due to bed position and edema, grip weak with minimal thumb mobility. minutely stronger than RUE. ?LUE Coordination: decreased fine motor;decreased gross motor ?  ?Lower Extremity Assessment ?Lower Extremity Assessment: Defer to  PT evaluation ?RLE Deficits / Details: following command able to move hip, knee and ankle within limited active range, very stiff, limited by ?RLE Coordination: decreased fine motor ?LLE Deficits / Details: following command able to move hip, knee and ankle within limited active range, very stiff ?LLE Coordination: decreased fine motor ?  ?  ?  ? ?Vision   ?Vision Assessment?: Vision impaired- to be further tested in functional context ?  ?Perception   ?  ?Praxis   ?  ? ?Cognition Arousal/Alertness: Awake/alert ?Behavior During Therapy: Flat affect ?Overall Cognitive Status: Difficult to assess ?  ?  ?  ?  ?  ?  ?  ?  ?  ?  ?  ?  ?  ?  ?  ?  ?General Comments: Follow simple commands. fatigues quickly ?  ?  ?   ?Exercises General Exercises - Lower Extremity ?Heel Slides: AAROM, Both, 10 reps, Supine ?Hip ABduction/ADduction: AAROM, Both, 10 reps, Supine ? ?  ?Shoulder Instructions   ? ? ?  ?General Comments VSS on vent via trach. PMV on during session.  ? ? ?Pertinent Vitals/ Pain       Pain Assessment ?Pain Assessment: Faces ?Faces Pain Scale: No hurt ?Pain Location: generalized with movement ?Pain Descriptors / Indicators: Grimacing, Guarding ?Pain Intervention(s): Monitored during session, Limited activity within patient's tolerance, Repositioned ? ?Home Living   ?  ?  ?  ?  ?  ?  ?  ?  ?  ?  ?  ?  ?  ?  ?  ?  ?  ?  ? ?  ?Prior Functioning/Environment    ?  ?  ?  ?   ? ?Frequency ? Min 2X/week  ? ? ? ? ?  ?Progress Toward Goals ? ?OT Goals(current goals can now be found in the care plan section) ? Progress towards OT goals: Progressing toward goals ? ?Acute Rehab OT Goals ?OT Goal Formulation: With patient/family ?Time For Goal Achievement: 10/25/21 ?Potential to Achieve Goals: Good ?ADL Goals ?Additional ADL Goal #1: Pt will complete rolling in bed both sides with mod assist as a precurser to sitting EOB. ?Additional ADL Goal #2: Pt will visually scan room and find 3 objects with independence. ?Additional ADL  Goal #3: Pt will complete AAROM of BUE with 50%/mod assist. ?Additional ADL Goal #4: Pt will hold and utilize tooth brush sponge to sucessfully brush her teeth and mouth with min A.  ?Plan Discharge plan remains appropriate   ? ?Co-evaluation ? ? ?   ?  ?  ?  ?  ? ?  ?AM-PAC OT "6 Clicks" Daily Activity     ?Outcome Measure ? ? Help from another person eating meals?: Total ?Help from another person taking care of personal grooming?: Total ?Help from another person toileting, which includes using toliet, bedpan, or urinal?: Total ?Help from another person bathing (including washing, rinsing, drying)?: Total ?Help from another person to put on and taking off regular upper body clothing?: Total ?Help from another person to put on and taking off regular lower body clothing?: Total ?6 Click Score: 6 ? ?  ?End of Session Equipment Utilized During Treatment: Oxygen ? ?OT Visit Diagnosis: Unsteadiness on feet (R26.81);Other abnormalities of gait and mobility (R26.89);Muscle weakness (generalized) (M62.81) ?  ?  Activity Tolerance Patient tolerated treatment well ?  ?Patient Left with call bell/phone within reach;in chair;with chair alarm set ?  ?Nurse Communication Mobility status ?  ? ?   ? ?Time: 1941-7408 ?OT Time Calculation (min): 28 min ? ?Charges: OT General Charges ?$OT Visit: 1 Visit ?OT Treatments ?$Therapeutic Activity: 8-22 mins ? ?Hance Caspers MSOT, OTR/L ?Acute Rehab ?Pager: [phone] ?Office: [phone] ? ?Audine Mangione M Misael Mcgaha ?10/15/2021, 4:07 PM ? ? ?

## 2021-10-15 NOTE — Progress Notes (Signed)
? ?Progress Note ? ?Patient Name: [name] ?Date of Encounter: 10/15/2021 ? ?[facility] Cardiologist: Dorris Carnes, MD  ? ?Subjective  ? ?No acute events overnight. Patient awake and alert, on trach collar, denies pain. ? ?Inpatient Medications  ?  ?Scheduled Meds: ? arformoterol  15 mcg Nebulization BID  ? aspirin  81 mg Per NG tube Daily  ? budesonide (PULMICORT) nebulizer solution  0.5 mg Nebulization BID  ? chlorhexidine gluconate (MEDLINE KIT)  15 mL Mouth Rinse BID  ? Chlorhexidine Gluconate Cloth  6 each Topical Daily  ? cholestyramine  4 g Per Tube TID  ? clonazePAM  0.5 mg Per Tube BID  ? docusate  100 mg Per Tube BID  ? enoxaparin (LOVENOX) injection  40 mg Subcutaneous Q24H  ? feeding supplement (PROSource TF)  45 mL Per Tube Daily  ? free water  200 mL Per Tube Q4H  ? Gerhardt's butt cream   Topical QID  ? guanFACINE  0.5 mg Per Tube BID  ? insulin aspart  0-20 Units Subcutaneous Q4H  ? mouth rinse  15 mL Mouth Rinse 10 times per day  ? montelukast  10 mg Per Tube q AM  ? oxyCODONE  5 mg Per Tube Q6H  ? pantoprazole sodium  40 mg Per Tube Daily  ? QUEtiapine  50 mg Per Tube BID  ? revefenacin  175 mcg Nebulization Daily  ? rosuvastatin  20 mg Per NG tube Daily  ? ?Continuous Infusions: ? sodium chloride Stopped (10/09/21 1028)  ? sodium chloride    ? feeding supplement (OSMOLITE 1.2 CAL) 1,000 mL (10/14/21 2030)  ? ?PRN Meds: ?sodium chloride, acetaminophen **OR** acetaminophen, docusate, guaiFENesin-dextromethorphan, levalbuterol, liver oil-zinc oxide, polyethylene glycol, senna, white petrolatum  ? ?Vital Signs  ?  ?Vitals:  ? 10/15/21 0610 10/15/21 0630 10/15/21 0700 10/15/21 0736  ?BP:  (!) 119/58 103/63   ?Pulse: (!) 102  88 83  ?Resp: (!) 28 (!) 31 (!) 25 (!) 24  ?Temp:    98.2 ?F (36.8 ?C)  ?TempSrc:    Axillary  ?SpO2: 96%  96% 100%  ?Weight:      ?Height:      ? ? ?Intake/Output Summary (Last 24 hours) at 10/15/2021 0903 ?Last data filed at 10/15/2021 0700 ?Gross per 24 hour  ?Intake 2390 ml   ?Output 675 ml  ?Net 1715 ml  ? ? ?  10/15/2021  ?  3:22 AM 10/14/2021  ?  4:37 AM 10/13/2021  ?  1:53 AM  ?Last 3 Weights  ?Weight (lbs) 118 lb 2.7 oz 118 lb 6.2 oz 118 lb 6.2 oz  ?Weight (kg) 53.6 kg 53.7 kg 53.7 kg  ?   ? ?Telemetry  ?  ?Sinus ranging 80-110, 11 beats SVT - Personally Reviewed ? ?ECG  ?  ?No new since 4/17 - Personally Reviewed ? ?Physical Exam  ? ?GEN: Well nourished, well developed in no acute distress ?NECK: No JVD appreciate but trach collar in place ?CARDIAC: regular rhythm, normal S1 and S2, no rubs or gallops. No murmur. ?VASCULAR: Radial pulses 2+ bilaterally.  ?RESPIRATORY:  Distant but largely clear to auscultation without rales, wheezing or rhonchi  ?ABDOMEN: Soft, non-tender, non-distended ?MUSCULOSKELETAL:  Moves all 4 limbs independently ?SKIN: Warm and dry, no edema ?NEUROLOGIC:  No focal neuro deficits noted. ?PSYCHIATRIC:  Normal affect, appears in no distress, responds to questions appropriately  ? ?Labs  ?  ?High Sensitivity Troponin:   ?Recent Labs  ?Lab 09/24/21 ?1707 09/24/21 ?1931  ?  TROPONINIHS 7 12  ?   ?Chemistry ?Recent Labs  ?Lab 10/12/21 ?0230 10/13/21 ?9678 10/15/21 ?0151  ?NA 141 138 138  ?K 4.2 4.1 4.5  ?CL 105 99 105  ?CO2 31 32 29  ?GLUCOSE 132* 106* 120*  ?BUN 27* 24* 20  ?CREATININE 0.56 0.37* 0.41*  ?CALCIUM 8.2* 8.2* 8.2*  ?GFRNONAA >60 >60 >60  ?ANIONGAP 5 7 4*  ?  ?Lipids No results for input(s): CHOL, TRIG, HDL, LABVLDL, LDLCALC, CHOLHDL in the last 168 hours.  ?Hematology ?Recent Labs  ?Lab 10/12/21 ?0230 10/13/21 ?9381 10/15/21 ?0151  ?WBC 14.7* 12.7* 7.5  ?RBC 2.89* 3.06* 3.10*  ?HGB 9.6* 10.1* 10.1*  ?HCT 29.4* 30.4* 31.3*  ?MCV 101.7* 99.3 101.0*  ?MCH 33.2 33.0 32.6  ?MCHC 32.7 33.2 32.3  ?RDW 14.3 14.1 14.4  ?PLT 90* 99* 104*  ? ?Thyroid No results for input(s): TSH, FREET4 in the last 168 hours.  ?BNP ?No results for input(s): BNP, PROBNP in the last 168 hours. ?  ?DDimer No results for input(s): DDIMER in the last 168 hours.  ? ?Radiology  ?  ?No  results found. ? ?Cardiac Studies  ? ?Echo:  09/27/2021 ?  ?1. Limited visualization of the myocardium, incomplete assessment of wall  ?motion. Anteroseptal wall is akinetic. Apex is akinetic. Marland Kitchen Left  ?ventricular ejection fraction, by estimation, is 40%. The left ventricle  ?has mildly decreased function. The left  ?ventricle demonstrates regional wall motion abnormalities (see scoring  ?diagram/findings for description). Left ventricular diastolic parameters  ?are consistent with Grade I diastolic dysfunction (impaired relaxation).  ? 2. Right ventricular systolic function is normal. The right ventricular  ?size is normal. There is normal pulmonary artery systolic pressure.  ? 3. The mitral valve is normal in structure. No evidence of mitral valve  ?regurgitation. No evidence of mitral stenosis.  ? 4. The tricuspid valve is abnormal.  ? 5. The aortic valve is tricuspid. Aortic valve regurgitation is not  ?visualized. No aortic stenosis is present.  ? 6. The inferior vena cava is normal in size with greater than 50%  ?respiratory variability, suggesting right atrial pressure of 3 mmHg.  ? ?Patient Profile  ?   ?66 y.o. 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 Carles Collet. ? ?Assessment & Plan  ?  ?Cardiomyopathy ?Acute systolic and diastolic heart failure, now euvolemic ?Hypertension ?Hypercholesterolemia ?-EF 40% with focal wall motion abnormalities. Concern for ischemic etiology ?-losartan and metoprolol discontinued 5/7 for intermittent hypotension. Will add back when able. On guanfacine now, unclear if this was added for non-BP reason, but also likely lowering BP ?-would pursue ischemic evaluation as an outpatient, as she will need to significantly recover from this prolonged hospitalization  ?-platelets have been borderline, tolerating aspirin, watch for bleeding or worsening thrombocytopenia ?-transaminitis  improved (though not checked since 4/29), continue rosuvastatin 20 mg daily. Will recheck LFTs tomorrow. Will check lipid panel as well. ?-started on questran for loose stools, not lipids ? ?Acute hypoxic respiratory failure ?COPD exacerbation ?S/P tracheostomy ?-management per PCCM ? ?Tachycardia: ?-has been sinus rhythm/sinus tachycardia with rare pSVT ?-add metoprolol when BP allows ? ?For questions or updates, please contact [place] ?Please consult [email or web address] for contact info under  ?   ?Signed, ?Buford Dresser, MD  ?10/15/2021, 9:03 AM    ?

## 2021-10-16 ENCOUNTER — Encounter (HOSPITAL_COMMUNITY): Payer: Medicare Other

## 2021-10-16 DIAGNOSIS — Z930 Tracheostomy status: Secondary | ICD-10-CM | POA: Diagnosis not present

## 2021-10-16 DIAGNOSIS — J441 Chronic obstructive pulmonary disease with (acute) exacerbation: Secondary | ICD-10-CM | POA: Diagnosis not present

## 2021-10-16 DIAGNOSIS — I471 Supraventricular tachycardia: Secondary | ICD-10-CM | POA: Diagnosis not present

## 2021-10-16 DIAGNOSIS — I429 Cardiomyopathy, unspecified: Secondary | ICD-10-CM | POA: Diagnosis not present

## 2021-10-16 DIAGNOSIS — J9601 Acute respiratory failure with hypoxia: Secondary | ICD-10-CM | POA: Diagnosis not present

## 2021-10-16 DIAGNOSIS — J9602 Acute respiratory failure with hypercapnia: Secondary | ICD-10-CM | POA: Diagnosis not present

## 2021-10-16 LAB — CBC WITH DIFFERENTIAL/PLATELET
Abs Immature Granulocytes: 0.04 10*3/uL (ref 0.00–0.07)
Basophils Absolute: 0 10*3/uL (ref 0.0–0.1)
Basophils Relative: 0 %
Eosinophils Absolute: 0.2 10*3/uL (ref 0.0–0.5)
Eosinophils Relative: 2 %
HCT: 32.9 % — ABNORMAL LOW (ref 36.0–46.0)
Hemoglobin: 10.9 g/dL — ABNORMAL LOW (ref 12.0–15.0)
Immature Granulocytes: 1 %
Lymphocytes Relative: 10 %
Lymphs Abs: 0.8 10*3/uL (ref 0.7–4.0)
MCH: 32.4 pg (ref 26.0–34.0)
MCHC: 33.1 g/dL (ref 30.0–36.0)
MCV: 97.9 fL (ref 80.0–100.0)
Monocytes Absolute: 0.4 10*3/uL (ref 0.1–1.0)
Monocytes Relative: 5 %
Neutro Abs: 6.6 10*3/uL (ref 1.7–7.7)
Neutrophils Relative %: 82 %
Platelets: 126 10*3/uL — ABNORMAL LOW (ref 150–400)
RBC: 3.36 MIL/uL — ABNORMAL LOW (ref 3.87–5.11)
RDW: 14.2 % (ref 11.5–15.5)
WBC: 8 10*3/uL (ref 4.0–10.5)
nRBC: 0 % (ref 0.0–0.2)

## 2021-10-16 LAB — COMPREHENSIVE METABOLIC PANEL
ALT: 103 U/L — ABNORMAL HIGH (ref 0–44)
AST: 39 U/L (ref 15–41)
Albumin: 2.1 g/dL — ABNORMAL LOW (ref 3.5–5.0)
Alkaline Phosphatase: 114 U/L (ref 38–126)
Anion gap: 9 (ref 5–15)
BUN: 16 mg/dL (ref 8–23)
CO2: 28 mmol/L (ref 22–32)
Calcium: 8.5 mg/dL — ABNORMAL LOW (ref 8.9–10.3)
Chloride: 99 mmol/L (ref 98–111)
Creatinine, Ser: 0.43 mg/dL — ABNORMAL LOW (ref 0.44–1.00)
GFR, Estimated: >60 mL/min (ref >60)
Glucose, Bld: 121 mg/dL — ABNORMAL HIGH (ref 70–99)
Potassium: 3.6 mmol/L (ref 3.5–5.1)
Sodium: 136 mmol/L (ref 135–145)
Total Bilirubin: 0.7 mg/dL (ref 0.3–1.2)
Total Protein: 6.8 g/dL (ref 6.5–8.1)

## 2021-10-16 LAB — GLUCOSE, CAPILLARY
Glucose-Capillary: 114 mg/dL — ABNORMAL HIGH (ref 70–99)
Glucose-Capillary: 116 mg/dL — ABNORMAL HIGH (ref 70–99)
Glucose-Capillary: 148 mg/dL — ABNORMAL HIGH (ref 70–99)
Glucose-Capillary: 85 mg/dL (ref 70–99)
Glucose-Capillary: 95 mg/dL (ref 70–99)

## 2021-10-16 LAB — LIPID PANEL
Cholesterol: 81 mg/dL (ref 0–200)
HDL: 46 mg/dL (ref >40)
LDL Cholesterol: 23 mg/dL (ref 0–99)
Total CHOL/HDL Ratio: 1.8 RATIO
Triglycerides: 61 mg/dL (ref <150)
VLDL: 12 mg/dL (ref 0–40)

## 2021-10-16 MED ORDER — CLONAZEPAM 0.25 MG PO TBDP
0.2500 mg | ORAL_TABLET | Freq: Once | ORAL | Status: AC
  Administered 2021-10-16: 0.25 mg via ORAL
  Filled 2021-10-16: qty 1

## 2021-10-16 MED ORDER — POLYETHYLENE GLYCOL 3350 17 G PO PACK
17.0000 g | PACK | Freq: Every day | ORAL | Status: DC
Start: 2021-10-16 — End: 2021-11-01
  Administered 2021-10-16 – 2021-10-31 (×13): 17 g
  Filled 2021-10-16 (×14): qty 1

## 2021-10-16 MED ORDER — SENNA 8.6 MG PO TABS
1.0000 | ORAL_TABLET | Freq: Every day | ORAL | Status: DC
  Administered 2021-10-16 – 2021-11-05 (×18): 8.6 mg
  Filled 2021-10-16 (×18): qty 1

## 2021-10-16 MED ORDER — POTASSIUM CHLORIDE 20 MEQ PO PACK
40.0000 meq | PACK | Freq: Once | ORAL | Status: AC
  Administered 2021-10-16: 40 meq
  Filled 2021-10-16: qty 2

## 2021-10-16 MED ORDER — QUETIAPINE FUMARATE 50 MG PO TABS
50.0000 mg | ORAL_TABLET | Freq: Every day | ORAL | Status: DC
  Administered 2021-10-16 – 2021-10-24 (×9): 50 mg
  Filled 2021-10-16 (×11): qty 1

## 2021-10-16 MED ORDER — BISACODYL 10 MG RE SUPP
10.0000 mg | Freq: Once | RECTAL | Status: AC
  Administered 2021-10-16: 10 mg via RECTAL
  Filled 2021-10-16: qty 1

## 2021-10-16 MED ORDER — QUETIAPINE FUMARATE 25 MG PO TABS
25.0000 mg | ORAL_TABLET | Freq: Every morning | ORAL | Status: DC
  Administered 2021-10-16 – 2021-10-24 (×9): 25 mg
  Filled 2021-10-16 (×8): qty 1

## 2021-10-16 MED ORDER — CLONAZEPAM 0.5 MG PO TABS
0.5000 mg | ORAL_TABLET | Freq: Three times a day (TID) | ORAL | Status: DC
  Administered 2021-10-16 – 2021-11-05 (×61): 0.5 mg
  Filled 2021-10-16 (×62): qty 1

## 2021-10-16 MED ORDER — FUROSEMIDE 40 MG PO TABS
40.0000 mg | ORAL_TABLET | Freq: Every day | ORAL | Status: DC
  Administered 2021-10-16 – 2021-10-25 (×10): 40 mg
  Filled 2021-10-16 (×10): qty 1

## 2021-10-16 NOTE — Progress Notes (Addendum)
eLink Physician-Brief Progress Note ?Patient Name: [name] ?DOB: 27-Nov-1955 ?MRN: [id number] ? ? ?Date of Service ? 10/16/2021  ?HPI/Events of Note ? Patient is having an anxiety attack.  ?eICU Interventions ? Klonopin disintegrating tablet 0.25 via NG tube x 1 ordered.  ? ? ? ?  ? ?[name] ?10/16/2021, 3:01 AM ?

## 2021-10-16 NOTE — Progress Notes (Signed)
[facility] ADULT ICU REPLACEMENT PROTOCOL ? ? ?The patient does apply for the [facility] Adult ICU Electrolyte Replacment Protocol based on the criteria listed below:  ? ?1.Exclusion criteria: TCTS patients, ECMO patients, and Dialysis patients ?2. Is GFR >/= 30 ml/min? Yes.    ?Patient's GFR today is >60 ?3. Is SCr </= 2? Yes.   ?Patient's SCr is 0.43 mg/dL ?4. Did SCr increase >/= 0.5 in 24 hours? No. ?5.Pt's weight >40kg  Yes.   ?6. Abnormal electrolyte(s): K+ 3.6  ?7. Electrolytes replaced per protocol ?8.  Call MD STAT for K+ </= 2.5, Phos </= 1, or Mag </= 1 ?Physician:  n/a ? ?[name] 10/16/2021 6:03 AM ? ?

## 2021-10-16 NOTE — Progress Notes (Signed)
Pt's personal blanket and laptop sent home with pt's sister Roslyn. ?

## 2021-10-16 NOTE — Progress Notes (Addendum)
? ?NAME:  [name], MRN:  [id number], DOB:  1956-02-02, LOS: 21 ?ADMISSION DATE:  09/24/2021, CONSULTATION DATE:  4/18 ?REFERRING MD:  Tat/ triad, CHIEF COMPLAINT:  resp distress   ? ?History of Present Illness:  ?66 y.o. female quit smoking 2021 with  GOLD 3 COPD MZ, uncontrolled anxiety. Was on 02 one year PTA but "took herself off" per friend at bedside and able to care for pets at home but mostly housebound sinc ethen ? ?She was admitted 4/17 to [facility], ICU with acute hypoxic/hypercarbic respiratory failure, failed BiPAP and required mechanical ventilation, transferred to [facility] 4/21 due to new finding of LV dysfunction. ? ?Significant Hospital Events: ?Including procedures, antibiotic start and stop dates in addition to other pertinent events   ?ET  4/18 c/b hypotension with severe air trapping  ?Echo 4/20 with EF 40% , akinesis of apex and septal wall ?4/24 CTA head negative for any significant abnormality ?4/25 ketamine added ?4/28 increased work of breathing during the wean, severe auto PEEP ?4/29 Febrile 101.2 ?5/1 perc trach placement ? ?Interim History / Subjective:  ? ?No acute events overnight. Continues to do well with trach collar >48h now. ? ?Objective   ?Blood pressure 100/63, pulse (!) 106, temperature 97.9 ?F (36.6 ?C), temperature source Oral, resp. rate (!) 33, height 5\' 1"  (1.549 m), weight 53.6 kg, SpO2 98 %. ?   ?Vent Mode: Stand-by ?FiO2 (%):  [28 %-93 %] 35 %  ? ?Intake/Output Summary (Last 24 hours) at 10/16/2021 0909 ?Last data filed at 10/16/2021 0600 ?Gross per 24 hour  ?Intake 1100 ml  ?Output 1920 ml  ?Net -820 ml  ? ? ?Filed Weights  ? 10/14/21 0437 10/15/21 0322 10/16/21 0500  ?Weight: 53.7 kg 53.6 kg 53.6 kg  ? ?Examination: ?General appearance: Resting in bed on trach collar, no acute distress ?Eyes: No gross abnormalities, tracks appropriately. Anicteric sclerae. ?HENT: Normocephalic, atraumatic. ?Neck: Trach collar in place, no exudate, no bleeding ?Lungs: Normal respiratory  effort. Clear to ausculation bilaterally ?CV: Regular rate, rhythm. No murmurs appreciated. ?Abdomen: Soft, non-distended, non-tender. Normoactive bowel sounds.  ?Extremities: No lower extremity edema appreciated, warm ?Skin: Warm, normal turgor and texture; no rash ?Neuro: Opens eyes and follows commands. ? ?Assessment & Plan:  ? ?#Acute hypoxemic/hypercapnic respiratory failure ?#Tracheostomy with vent dependent respiratory failure ?#HAP Klebsiella pneumonia - resolved ?#Resolving acute exacerbation of COPD - resolved ?Patient continues to do well on trach collar for last 48h, is now stable to be transferred out of ICU. Still pending insurance approval for LTACH.  ?- Continue trach collar as tolerated ?- Pending insurance approval for LTACH ?- Brovana, Pulmicort nebulizers  ?- Levalbuterol as needed ?- Continue PT/OT/SLP ? ?#Newly diagnosed heart failure w/ mildly reduced EF ?Echo earlier this admission revealed new drop in EF of 40% with septal apical akinesis concerning for ischemia. Beta blocker and ARB have been held due to hypotension over the weekend. BP improved today, will discuss with cards adding these back. Will also plan to down-titrate guanfacine, last day today. Platelets stable on aspirin. LDL appropriate. Plan for outpatient ischemic eval. ?- Re-start losartan, metoprolol per cards ?- Guanfacine 0.5mg  twice daily, last day today ?- Continue ASA 81mg  daily ?- Outpatient ischemic evaluation ? ?#Acute metabolic encephalopathy - resolved ?#Severe anxiety ?We have been slowly weaning down on centrally-acting medications. Decreased Seroquel 5/8, increase interval between oxycodone 5/8. ?- Seroquel 25mg  BID ?- Oxycodone 5mg  every 8 hours  ?- Klonopin 0.5mg  twice daily (decreased 5/6) ? ?#Thrombocytopenia ?Unclear source,  possibly due to acute illness. Continues to up-trend, 124 this AM. No signs or symptoms of bleeding. ?-Follow CBC ? ?#Severe protein-calorie malnutrition ?Getting tube feeds, electrolytes  are stable. Will continue to monitor.  ? ? ?Best Practice (right click and "Reselect all SmartList Selections" daily)  ? ?Diet/type: tubefeeds  ?DVT prophylaxis: LMWH  ?GI prophylaxis: PPI ?Lines: N/A ?Foley:  N/A ?Code Status:  full code ?Last date of multidisciplinary goals of care discussion [family updated bedside 5/5] ? ?Independent CC time: 20 minutes ? ?Sanjuan Dame, MD ?Internal Medicine PGY-2 ?Pager: [phone] ? ? ? ?

## 2021-10-16 NOTE — Progress Notes (Signed)
Nutrition Follow-up ? ?DOCUMENTATION CODES:  ? ?Severe malnutrition in context of chronic illness ? ?INTERVENTION:  ? ?Continue TF via Cortrak tube: ?Osmolite 1.2 at 55 ml/h (1320 ml per day). ?Prosource TF 45 ml once daily. ? ?Provides 1624 kcal, 84 gm protein, 1082 ml free water daily. ? ?Monitor for ability to begin PO diet and assist with transition to oral nutrition.  ? ?NUTRITION DIAGNOSIS:  ? ?Severe Malnutrition related to chronic illness (COPD) as evidenced by severe muscle depletion, severe fat depletion. ? ?Ongoing  ? ?GOAL:  ? ?Patient will meet greater than or equal to 90% of their needs ? ?Met with TF ? ?MONITOR:  ? ?Vent status, TF tolerance, Labs ? ?REASON FOR ASSESSMENT:  ? ?Ventilator ?  ? ?ASSESSMENT:  ? ?Pt admitted with difficulty breathing d/t acute respiratory failure with hypoxia and hypercapnia. Pt with diagnosis of PNA 4/16. PMH significant for COPD, uncontrolled anxiety. ? ?Discussed patient in ICU rounds and with RN today. ?S/P trach 5/1. ?Tolerating trach collar today. ?SLP working with patient on PMV use.  ?She was not ready to try POs during SLP evaluation earlier today.  ?Receiving Questran for loose stools.  ? ?Cortrak was placed 4/28, tip in the stomach. Currently receiving Osmolite 1.2 at 55 ml/h with Prosource TF 45 ml once daily to provide 1624 kcal, 84 gm protein, 1082 ml free water daily. ?Free water 200 ml every 4 hours.  ? ?Labs reviewed.  ?CBG: [phone] ? ?Medications reviewed and include Questran, Lasix, Novolog, Miralax, Senokot. ? ?Diet Order:   ?Diet Order   ? ?       ?  Diet NPO time specified  Diet effective now       ?  ? ?  ?  ? ?  ? ? ?EDUCATION NEEDS:  ? ?No education needs have been identified at this time ? ?Skin:  Skin Assessment: Reviewed RN Assessment (MASD to buttocks) ? ?Last BM:  5/9 type 7 ? ?Height:  ? ?Ht Readings from Last 1 Encounters:  ?09/25/21 '5\' 1"'  (1.549 m)  ? ? ?Weight:  ? ?Wt Readings from Last 1 Encounters:  ?10/16/21 53.6 kg  ? ? ? ?BMI:   Body mass index is 22.33 kg/m?. ? ?Estimated Nutritional Needs:  ? ?Kcal:  1600-1800 ? ?Protein:  75-90g ? ?Fluid:  >/=1.5L ? ? ? ?Lucas Mallow RD, LDN, CNSC ?Please refer to Amion for contact information.                                                       ? ?

## 2021-10-16 NOTE — Progress Notes (Addendum)
Speech Language Pathology Treatment: Nada Boozer Speaking valve  ?Patient Details ?Name: [name] ?MRN: [id number] ?DOB: Mar 30, 1956 ?Today's Date: 10/16/2021 ?Time: [id number] ?SLP Time Calculation (min) (ACUTE ONLY): 9 min ? ?Assessment / Plan / Recommendation ?Clinical Impression ? Pt had to have a bowel movement therefore session was abbreviated today. PMV donned for 9 minutes with increased mildly work of breathing. At baseline RR was 32 and throughout session was 24-34. HR 108 and 97% SpO2. Valve did fall from trach hub on 2 occasions but pt did not seem in distress and when therapist removed valve there was no indication of back pressure. Her vocal intensity was low, intelligibility mildly reduced and given cues to increase labial and mandibular movement and volume when speaking with demonstration. She could not recall strategies after 4 min delay. Wear PMV with all therapies and RN with full supervision.  ?She declined PO trials today stating her stomach hurt and she needed to have a BM (therapist told RN). ST will continue to follow for readiness for instrumental study and PMV. ?  ?HPI HPI: Pt is a 66 yo female presenting to [facility] with acute hypoxic/hypercarbic respiratory failure requiring intubation 4/19 followed by transfer to [facility] after new finding of LV dysfunction. Trach 5/1. PMH includes: COPD, previously on home O2 but "took herself off," former smoker (quit 2021), uncontrolled anxiety ?  ?   ?SLP Plan ? Continue with current plan of care ? ?  ?  ?Recommendations for follow up therapy are one component of a multi-disciplinary discharge planning process, led by the attending physician.  Recommendations may be updated based on patient status, additional functional criteria and insurance authorization. ?  ? ?Recommendations  ?   ?   ? Patient may use Passy-Muir Speech Valve: During all therapies with supervision;Intermittently with supervision ?PMSV Supervision: Full ?MD: Please consider changing trach  tube to : Cuffless  ?   ? ? ? ? General recommendations: Rehab consult ?Oral Care Recommendations: Oral care QID ?Follow Up Recommendations: Acute inpatient rehab (3hours/day) ?Assistance recommended at discharge: Frequent or constant Supervision/Assistance ?SLP Visit Diagnosis: Aphonia (R49.1) ?Plan: Continue with current plan of care ? ? ? ? ?  ?  ? ? ?Houston Siren ? ?10/16/2021, 1:26 PM ?

## 2021-10-16 NOTE — Progress Notes (Signed)
? ?Progress Note ? ?Patient Name: [name] ?Date of Encounter: 10/16/2021 ? ?[facility] Cardiologist: Penny Carnes, MD  ? ?Subjective  ? ?No acute events overnight. Patient resting comfortably on trach collar. ? ?Inpatient Medications  ?  ?Scheduled Meds: ? arformoterol  15 mcg Nebulization BID  ? aspirin  81 mg Per NG tube Daily  ? bisacodyl  10 mg Rectal Once  ? budesonide (PULMICORT) nebulizer solution  0.5 mg Nebulization BID  ? chlorhexidine gluconate (MEDLINE KIT)  15 mL Mouth Rinse BID  ? Chlorhexidine Gluconate Cloth  6 each Topical Daily  ? cholestyramine  4 g Per Tube TID  ? clonazePAM  0.5 mg Per Tube TID  ? enoxaparin (LOVENOX) injection  40 mg Subcutaneous Q24H  ? feeding supplement (PROSource TF)  45 mL Per Tube Daily  ? free water  200 mL Per Tube Q4H  ? Gerhardt's butt cream   Topical QID  ? guanFACINE  0.5 mg Per Tube BID  ? insulin aspart  0-20 Units Subcutaneous Q4H  ? mouth rinse  15 mL Mouth Rinse 10 times per day  ? montelukast  10 mg Per Tube q AM  ? oxyCODONE  5 mg Per Tube Q8H  ? pantoprazole sodium  40 mg Per Tube Daily  ? polyethylene glycol  17 g Per Tube Daily  ? QUEtiapine  25 mg Per Tube q morning  ? QUEtiapine  50 mg Per Tube QHS  ? revefenacin  175 mcg Nebulization Daily  ? rosuvastatin  20 mg Per NG tube Daily  ? senna  1 tablet Per Tube Daily  ? ?Continuous Infusions: ? sodium chloride Stopped (10/09/21 1028)  ? sodium chloride    ? feeding supplement (OSMOLITE 1.2 CAL) 1,000 mL (10/14/21 2030)  ? ?PRN Meds: ?sodium chloride, acetaminophen **OR** acetaminophen, docusate, guaiFENesin-dextromethorphan, levalbuterol, liver oil-zinc oxide, white petrolatum  ? ?Vital Signs  ?  ?Vitals:  ? 10/16/21 0500 10/16/21 0600 10/16/21 0740 10/16/21 0751  ?BP: (!) 102/56 100/63    ?Pulse: 93 89  (!) 106  ?Resp: (!) 31 (!) 28  (!) 33  ?Temp:   97.9 ?F (36.6 ?C)   ?TempSrc:   Oral   ?SpO2: 99% 99%  98%  ?Weight: 53.6 kg     ?Height:      ? ? ?Intake/Output Summary (Last 24 hours) at 10/16/2021  0936 ?Last data filed at 10/16/2021 0600 ?Gross per 24 hour  ?Intake 1100 ml  ?Output 1920 ml  ?Net -820 ml  ? ? ?  10/16/2021  ?  5:00 AM 10/15/2021  ?  3:22 AM 10/14/2021  ?  4:37 AM  ?Last 3 Weights  ?Weight (lbs) 118 lb 2.7 oz 118 lb 2.7 oz 118 lb 6.2 oz  ?Weight (kg) 53.6 kg 53.6 kg 53.7 kg  ?   ? ?Telemetry  ?  ?NSR- Personally Reviewed ? ?ECG  ?  ?No new since 4/17 - Personally Reviewed ? ?Physical Exam  ? ?GEN: Well nourished, well developed in no acute distress ?NECK: No JVD appreciated but trach collar in place ?CARDIAC: regular rhythm, normal S1 and S2, no rubs or gallops. No murmur. ?VASCULAR: Radial pulses 2+ bilaterally.  ?RESPIRATORY:  Clear to auscultation without rales, wheezing or rhonchi  ?ABDOMEN: Soft, non-tender, non-distended ?MUSCULOSKELETAL:  Moves all 4 limbs independently ?SKIN: Warm and dry, no edema ?NEUROLOGIC:  No focal neuro deficits noted. ?PSYCHIATRIC:  Normal affect   ? ?Labs  ?  ?High Sensitivity Troponin:   ?Recent Labs  ?  Lab 09/24/21 ?1707 09/24/21 ?1931  ?TROPONINIHS 7 12  ?   ?Chemistry ?Recent Labs  ?Lab 10/13/21 ?4103 10/15/21 ?0151 10/16/21 ?0327  ?NA 138 138 136  ?K 4.1 4.5 3.6  ?CL 99 105 99  ?CO2 32 29 28  ?GLUCOSE 106* 120* 121*  ?BUN 24* 20 16  ?CREATININE 0.37* 0.41* 0.43*  ?CALCIUM 8.2* 8.2* 8.5*  ?PROT  --   --  6.8  ?ALBUMIN  --   --  2.1*  ?AST  --   --  39  ?ALT  --   --  103*  ?ALKPHOS  --   --  114  ?BILITOT  --   --  0.7  ?GFRNONAA >60 >60 >60  ?ANIONGAP 7 4* 9  ?  ?Lipids  ?Recent Labs  ?Lab 10/16/21 ?0327  ?CHOL 81  ?TRIG 61  ?HDL 46  ?Joppa 23  ?CHOLHDL 1.8  ?  ?Hematology ?Recent Labs  ?Lab 10/13/21 ?0131 10/15/21 ?0151 10/16/21 ?0327  ?WBC 12.7* 7.5 8.0  ?RBC 3.06* 3.10* 3.36*  ?HGB 10.1* 10.1* 10.9*  ?HCT 30.4* 31.3* 32.9*  ?MCV 99.3 101.0* 97.9  ?MCH 33.0 32.6 32.4  ?MCHC 33.2 32.3 33.1  ?RDW 14.1 14.4 14.2  ?PLT 99* 104* 126*  ? ?Thyroid No results for input(s): TSH, FREET4 in the last 168 hours.  ?BNP ?No results for input(s): BNP, PROBNP in the last 168  hours. ?  ?DDimer No results for input(s): DDIMER in the last 168 hours.  ? ?Radiology  ?  ?No results found. ? ?Cardiac Studies  ? ?Echo:  09/27/2021 ?  ?1. Limited visualization of the myocardium, incomplete assessment of wall  ?motion. Anteroseptal wall is akinetic. Apex is akinetic. [name] Kitchen Left  ?ventricular ejection fraction, by estimation, is 40%. The left ventricle  ?has mildly decreased function. The left  ?ventricle demonstrates regional wall motion abnormalities (see scoring  ?diagram/findings for description). Left ventricular diastolic parameters  ?are consistent with Grade I diastolic dysfunction (impaired relaxation).  ? 2. Right ventricular systolic function is normal. The right ventricular  ?size is normal. There is normal pulmonary artery systolic pressure.  ? 3. The mitral valve is normal in structure. No evidence of mitral valve  ?regurgitation. No evidence of mitral stenosis.  ? 4. The tricuspid valve is abnormal.  ? 5. The aortic valve is tricuspid. Aortic valve regurgitation is not  ?visualized. No aortic stenosis is present.  ? 6. The inferior vena cava is normal in size with greater than 50%  ?respiratory variability, suggesting right atrial pressure of 3 mmHg.  ? ?Patient Profile  ?   ?66 y.o. 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 Carles Collet. ? ?Assessment & Plan  ?  ?Cardiomyopathy ?Acute systolic and diastolic heart failure, now euvolemic ?Hypertension ?Hypercholesterolemia ?-EF 40% with focal wall motion abnormalities. Concern for ischemic etiology ?-losartan and metoprolol discontinued 5/7 for intermittent hypotension. Will add back when able. On guanfacine now, unclear if this was added for non-BP reason, but also likely lowering BP ?-would pursue ischemic evaluation as an outpatient, as she will need to significantly recover from this prolonged hospitalization  ?-platelets have been  borderline, tolerating aspirin, watch for bleeding or worsening thrombocytopenia ?-transaminitis improved (labs reviewed 5/9), continue rosuvastatin 20 mg daily. ?-low lipids this admission=Tchol 81, TG 61, HDL 46, LDL 23. Has been on tube feeds for some time ?-started on questran for loose  stools, not lipids ? ?Acute hypoxic respiratory failure ?COPD exacerbation ?S/P tracheostomy ?-management per PCCM ? ?Tachycardia: ?-has been sinus rhythm/sinus tachycardia with rare pSVT ?-add metoprolol when BP allows ? ?For questions or updates, please contact [place] ?Please consult [email or web address] for contact info under  ?   ?Signed, ?Buford Dresser, MD  ?10/16/2021, 9:36 AM    ?

## 2021-10-17 DIAGNOSIS — J9601 Acute respiratory failure with hypoxia: Secondary | ICD-10-CM | POA: Diagnosis not present

## 2021-10-17 DIAGNOSIS — G9340 Encephalopathy, unspecified: Secondary | ICD-10-CM | POA: Diagnosis not present

## 2021-10-17 DIAGNOSIS — E43 Unspecified severe protein-calorie malnutrition: Secondary | ICD-10-CM

## 2021-10-17 DIAGNOSIS — J441 Chronic obstructive pulmonary disease with (acute) exacerbation: Secondary | ICD-10-CM | POA: Diagnosis not present

## 2021-10-17 LAB — GLUCOSE, CAPILLARY
Glucose-Capillary: 100 mg/dL — ABNORMAL HIGH (ref 70–99)
Glucose-Capillary: 107 mg/dL — ABNORMAL HIGH (ref 70–99)
Glucose-Capillary: 114 mg/dL — ABNORMAL HIGH (ref 70–99)
Glucose-Capillary: 127 mg/dL — ABNORMAL HIGH (ref 70–99)
Glucose-Capillary: 129 mg/dL — ABNORMAL HIGH (ref 70–99)
Glucose-Capillary: 145 mg/dL — ABNORMAL HIGH (ref 70–99)
Glucose-Capillary: 84 mg/dL (ref 70–99)

## 2021-10-17 NOTE — Progress Notes (Signed)
Speech Language Pathology Treatment: Dysphagia;Passy Muir Speaking valve  ?Patient Details ?Name: [name] ?MRN: [id number] ?DOB: [birth year] ?Today's Date: 10/17/2021 ?Time: 4536-4680 ?SLP Time Calculation (min) (ACUTE ONLY): 24 min ? ?Assessment / Plan / Recommendation ?Treatment focused on use of PMV and swallowing abilities/readiness for instrumental exam. Patient alert and pleasant, PMV in place upon arrival with normal WOB, vitals WFL. Vocal quality remains low intensity and breathy but 100% audible/intelligible in a quiet environment. Valve removed to check for breath stacking without presence. Oral care provided by RN prior to session. Patient agreeable and excited for po trials today, able to consume ice chips and thin liquid  (H2O) via small cup sips with max HOH assistance for self feeding given decreased strength and coordination. Consistent multiple swallows, intermittent throat clearing/cough post swallow suggest pharyngeal dysphagia which is to be expected s/p prolonged intubation, trach placement, and vocal quality indicative of decreased glottal closure. Patient may be able to better protect airway with thicker consistencies however. Will plan for FEES on 5/11 to evaluate swallowing physiology and determine ability to initiate pos. Continue to recommend PMV use with full RN/therapy supervision as patient does have a recent history of increased WOB with use and does not have the dexterity to remove valve on her own at this time if she is in distress. RN educated and verbalized understanding. Continue ice chips after oral care to facilitate use of swallowing musculature and hydration of oropharyngeal mucosa.   ?  ?HPI: Pt is a 66 yo female presenting to [facility] with acute hypoxic/hypercarbic respiratory failure requiring intubation 4/19 followed by transfer to [facility] after new finding of LV dysfunction. Trach 5/1. PMH includes: COPD, previously on home O2 but "took herself off," former smoker (quit 2021),  uncontrolled anxiety ?  ?  ? (FEES) ? ?  ?  ? ?Clinical Impression ?   ?HPI   ?   ?SLP Plan ?   ?Recommendations for follow up therapy are one component of a multi-disciplinary discharge planning process, led by the attending physician.  Recommendations may be updated based on patient status, additional functional criteria and insurance authorization. ?  ? ?Recommendations  ?Diet recommendations: NPO (ice chips after oral care) ?Liquids provided via: Teaspoon ?Medication Administration: Via alternative means  ?   ? Patient may use Passy-Muir Speech Valve: During all therapies with supervision;Intermittently with supervision ?PMSV Supervision: Full ?MD: Please consider changing trach tube to : Cuffless  ?   ? ? ? ? Oral Care Recommendations: Oral care QID ?Follow Up Recommendations: Acute inpatient rehab (3hours/day) ?Assistance recommended at discharge: Frequent or constant Supervision/Assistance ?SLP Visit Diagnosis: Aphonia (R49.1);Dysphagia, pharyngeal phase (R13.13) ?Plan:  (FEES) ? ? ? ? ?  ?  ?Jemya Depierro MA, CCC-SLP ? ? ?Theordore Cisnero Meryl ? ?10/17/2021, 9:28 AM ?

## 2021-10-17 NOTE — Progress Notes (Signed)
Physical Therapy Treatment ?Patient Details ?Name: [name] ?MRN: [id number] ?DOB: 17-Dec-1955 ?Today's Date: 10/17/2021 ? ? ?History of Present Illness 66 y/o F reports diagnosis of PNA on 4/16 Admitted 4/17 to [facility], ICU with acute hypoxic/hypercarbic respiratory failure, failed BiPAP and required mechanical ventilation, transferred to [facility] 4/21 due to new finding of LV dysfunction Echo 4/20 with EF 40% , akinesis of apex and septal wall. . Failed weaning attempt 4/28. PMH significant for COPD, tobacco use, HLD, GRED, and uncontrolled anxiety. ? ?  ?PT Comments  ? ? Pt remains very weak with very little activity tolerance. Pt with bilateral foot drop with preserved ROM but no active dorsiflexion or toe extension detected. Recommend PRAFO boot to alternate between rt and lt foot. Pt too fatigued after exercises and sitting in chair position to use maxisky to chair. Recommend up to recliner later via lift. Recommend LTACH at dc for further rehab.    ?Recommendations for follow up therapy are one component of a multi-disciplinary discharge planning process, led by the attending physician.  Recommendations may be updated based on patient status, additional functional criteria and insurance authorization. ? ?Follow Up Recommendations ? PT at Long-term acute care hospital ?  ?  ?Assistance Recommended at Discharge Frequent or constant Supervision/Assistance  ?Patient can return home with the following Two people to help with walking and/or transfers;Two people to help with bathing/dressing/bathroom;Assist for transportation;Help with stairs or ramp for entrance ?  ?Equipment Recommendations ? BSC/3in1;Wheelchair (measurements PT);Wheelchair cushion (measurements PT);Other (comment) (hoyer lift)  ?  ?Recommendations for Other Services   ? ? ?  ?Precautions / Restrictions Precautions ?Precautions: Fall ?Precaution Comments: trach, NG tube ?Restrictions ?Weight Bearing Restrictions: No  ?  ? ?Mobility ? Bed  Mobility ?Overal bed mobility: Needs Assistance ?  ?  ?  ?  ?  ?  ?General bed mobility comments: Bed placed in chair position. Total assist to bring trunk forward from bed into upright sitting ?  ? ?Transfers ?  ?  ?  ?  ?  ?  ?  ?  ?  ?  ?  ? ?Ambulation/Gait ?  ?  ?  ?  ?  ?  ?  ?  ? ? ?Stairs ?  ?  ?  ?  ?  ? ? ?Wheelchair Mobility ?  ? ?Modified Rankin (Stroke Patients Only) ?  ? ? ?  ?Balance Overall balance assessment: Needs assistance ?Sitting-balance support: Feet unsupported, Bilateral upper extremity supported ?Sitting balance-Leahy Scale: Zero ?Sitting balance - Comments: Max to total assist to maintain trunk forward from bed in chair position ?Postural control: Posterior lean ?  ?  ?  ?  ?  ?  ?  ?  ?  ?  ?  ?  ?  ?  ?  ? ?  ?Cognition Arousal/Alertness: Awake/alert ?Behavior During Therapy: Anxious ?Overall Cognitive Status: Impaired/Different from baseline ?Area of Impairment: Attention, Following commands, Problem solving, Awareness ?  ?  ?  ?  ?  ?  ?  ?  ?  ?Current Attention Level: Selective ?  ?Following Commands: Follows one step commands consistently, Follows one step commands with increased time ?  ?Awareness: Emergent ?Problem Solving: Slow processing, Requires verbal cues, Requires tactile cues ?  ?  ?  ? ?  ?Exercises General Exercises - Upper Extremity ?Shoulder Flexion: AAROM, Both, 10 reps, Seated ?Elbow Flexion: AAROM, Both, 10 reps, Seated ?Elbow Extension: AAROM, Both, 10 reps, Seated ?Wrist Extension: AAROM, Both, 10 reps,  Seated ?Digit Composite Flexion: AROM, Both, 10 reps, Seated ?Composite Extension: AAROM, Both, 10 reps, Seated ?General Exercises - Lower Extremity ?Ankle Circles/Pumps: AAROM, Both, 10 reps, Supine ?Short Arc Quad: AAROM, Both, 10 reps, Supine ?Heel Slides: AAROM, Both, 10 reps, Supine ?Hip ABduction/ADduction: AAROM, Both, 10 reps, Supine ? ?  ?General Comments General comments (skin integrity, edema, etc.): VSS on trach collar ?  ?  ? ?Pertinent Vitals/Pain  Pain Assessment ?Pain Assessment: Faces ?Faces Pain Scale: Hurts a little bit ?Pain Location: all over ?Pain Descriptors / Indicators: Grimacing ?Pain Intervention(s): Limited activity within patient's tolerance, Patient requesting pain meds-RN notified  ? ? ?Home Living   ?  ?  ?  ?  ?  ?  ?  ?  ?  ?   ?  ?Prior Function    ?  ?  ?   ? ?PT Goals (current goals can now be found in the care plan section) Progress towards PT goals: Progressing toward goals ? ?  ?Frequency ? ? ? Min 3X/week ? ? ? ?  ?PT Plan Discharge plan needs to be updated  ? ? ?Co-evaluation   ?  ?  ?  ?  ? ?  ?AM-PAC PT "6 Clicks" Mobility   ?Outcome Measure ? Help needed turning from your back to your side while in a flat bed without using bedrails?: Total ?Help needed moving from lying on your back to sitting on the side of a flat bed without using bedrails?: Total ?Help needed moving to and from a bed to a chair (including a wheelchair)?: Total ?Help needed standing up from a chair using your arms (e.g., wheelchair or bedside chair)?: Total ?Help needed to walk in hospital room?: Total ?Help needed climbing 3-5 steps with a railing? : Total ?6 Click Score: 6 ? ?  ?End of Session Equipment Utilized During Treatment: Oxygen ?Activity Tolerance: Patient limited by fatigue ?Patient left: with call bell/phone within reach;in bed;with bed alarm set ?Nurse Communication: Mobility status;Need for lift equipment ?PT Visit Diagnosis: Other abnormalities of gait and mobility (R26.89);Muscle weakness (generalized) (M62.81);Difficulty in walking, not elsewhere classified (R26.2) ?Pain - part of body:  (generalized) ?  ? ? ?Time: 1030-1104 ?PT Time Calculation (min) (ACUTE ONLY): 34 min ? ?Charges:  $Therapeutic Exercise: 8-22 mins ?$Therapeutic Activity: 8-22 mins          ?          ? ?[facility] PT ?Acute Rehabilitation Services ?Office [phone] ? ? ? ?Shary Decamp [facility] ?10/17/2021, 1:22 PM ? ?

## 2021-10-17 NOTE — TOC Progression Note (Signed)
Transition of Care (TOC) - Progression Note  ? ? ?Patient Details  ?Name: [name] ?MRN: [id number] ?Date of Birth: 1955/10/18 ? ?Transition of Care (TOC) CM/SW Contact  ?Lorri Frederick, LCSW ?Phone Number: ?10/17/2021, 3:39 PM ? ?Clinical Narrative:   LTAC appeal letter faxed to Halina Maidens, [phone] ? ? ? ?Expected Discharge Plan: Long Term Acute Care (LTAC) ?Barriers to Discharge: Continued Medical Work up ? ?Expected Discharge Plan and Services ?Expected Discharge Plan: Long Term Acute Care (LTAC) ?In-house Referral: Clinical Social Work ?  ?Post Acute Care Choice: Long Term Acute Care (LTAC) ?Living arrangements for the past 2 months: Single Family Home ?                ?  ?  ?  ?  ?  ?  ?  ?  ?  ?  ? ? ?Social Determinants of Health (SDOH) Interventions ?  ? ?Readmission Risk Interventions ?   ? View : No data to display.  ?  ?  ?  ? ? ?

## 2021-10-17 NOTE — Progress Notes (Signed)
? ?PROGRESS NOTE ? ? ? ?[name]  [id number] DOB: [birth year] DOA: 09/24/2021 ?PCP: Everardo Beals, NP ? ? ?Brief Narrative: ? 66 y.o. female with medical history significant for COPD, uncontrolled anxiety. ?Patient presented to the ED with complaints of difficulty breathing, cough, wheezing of about 1 week duration.  She also reports wheezing, and fatigue.  She reports O2 sats down to 78% on room air. ?She reports she was diagnosed with pneumonia yesterday, given a shot of antibiotic continue on antibiotics and steroids. ?Despite use of inhalers, breathing continues to worsen.  No leg swelling no chest pain.  Patient has a history of anxiety and panic attacks. ?EMS reports patient had rhonchi, O2 sats in the 80s on arrival to the ED.   In the ED, patient was placed BiPAP. ?She was subsequently weaned to to [facility], but in the afternoon 4/19, she began having sob again with tachypnea for which she was placed back on BiPAP. ?ABG showed hypercarbic respiratory failure 7.09/105/232/31 on biPAP, so she was intubated.  PCCM was consulted to assist. ? ? ?Assessment and Plan: ? ?Acute respiratory failure with hypoxia and hypercapnia ?Tracheostomy/vent dependent respiratory failure ?Patient admitted initially with COPD exacerbation and managed on BiPAP. Worsening respiratory failure lead to [facility] consult and requirement for mechanical ventilation. Patient intubated on 4/19. Prolonged ventilator wean and so tracheostomy performed on 5/1. ?-Continue trach care per PCCM ?-Trach collar, 5 L/min ? ?Hospital acquired pneumonia ?Tracheal aspirate (4/27) significant for klebsiella pneumoniae infection. Patient completed 6 days of antibiotic therapy with Ceftriaxone/Cefepime. ? ?COPD exacerbation ?Initial problem which led to acute respiratory failure and mechanical ventilation. Patient treated with Steroids and nebulizer treatments. Exacerbation resolved. ?-Continue Brovana, Pulmicort scheduled and Xopenex PRN ? ?Acute  systolic heart failure ?LVEF of 40% on recent Transthoracic Echocardiogram with focal WMA. Cardiology consulted with recommendation for outpatient ischemic evaluation. ? ?Acute metabolic encephalopathy ?Resolved. Managed with Seroquel which is being decreased. ? ?Severe anxiety ?Continue Klonopin and Seroquel ? ?Thrombocytopenia ?Transient in setting of acute illness. Improved. ? ?Severe malnutrition ?Cortrak feeding tube placed on 4/28. Speech therapy on board. Discussed with patient and she is agreeable to PEG tube if she is unable to transition to oral intake. ? ?Hyperlipidemia ?-Continue Crestor ? ?Hyperglycemia ?Hemoglobin A1C of 5.4% ? ? ?DVT prophylaxis: Lovenox ?Code Status:   Code Status: Full Code ?Family Communication: None at bedside ?Disposition Plan: Discharge hopefully to [facility] when bed is available ? ? ?Consultants:  ?PCCM ?Cardiology ? ?Procedures:  ?Transthoracic Echocardiogram (09/27/21) ? ?Antimicrobials: ?Ceftriaxone ?Cefepime  ? ? ?Subjective: ?Patient without dyspnea or chest pain today. No specific concerns. ? ?Objective: ?BP 118/62   Pulse 95   Temp (!) 97.4 ?F (36.3 ?C) (Oral)   Resp 18   Ht 5\' 1"  (1.549 m)   Wt 53.6 kg   SpO2 100%   BMI 22.33 kg/m?  ? ?Examination: ? ?General exam: Appears calm and comfortable ?Respiratory system: Diminished. Respiratory effort normal. ?Cardiovascular system: S1 & S2 heard, RRR. No murmurs, rubs, gallops or clicks. ?Gastrointestinal system: Abdomen is nondistended, soft and nontender. Normal bowel sounds heard. ?Central nervous system: Alert and oriented. No focal neurological deficits. ?Musculoskeletal: No edema. No calf tenderness. Bilateral foot in plantar flexion. ?Skin: No cyanosis. No rashes ?Psychiatry: Judgement and insight appear normal. Mood & affect appropriate.  ? ? ?Data Reviewed: I have personally reviewed following labs and imaging studies ? ?CBC ?Lab Results  ?Component Value Date  ? WBC 8.0 10/16/2021  ? RBC 3.36 (L) 10/16/2021  ?  HGB 10.9 (L) 10/16/2021  ? HCT 32.9 (L) 10/16/2021  ? MCV 97.9 10/16/2021  ? MCH 32.4 10/16/2021  ? PLT 126 (L) 10/16/2021  ? MCHC 33.1 10/16/2021  ? RDW 14.2 10/16/2021  ? LYMPHSABS 0.8 10/16/2021  ? MONOABS 0.4 10/16/2021  ? EOSABS 0.2 10/16/2021  ? BASOSABS 0.0 10/16/2021  ? ? ? ?Last metabolic panel ?Lab Results  ?Component Value Date  ? NA 136 10/16/2021  ? K 3.6 10/16/2021  ? CL 99 10/16/2021  ? CO2 28 10/16/2021  ? BUN 16 10/16/2021  ? CREATININE 0.43 (L) 10/16/2021  ? GLUCOSE 121 (H) 10/16/2021  ? GFRNONAA >60 10/16/2021  ? GFRAA >60 05/14/2019  ? CALCIUM 8.5 (L) 10/16/2021  ? PHOS 3.1 10/08/2021  ? PROT 6.8 10/16/2021  ? ALBUMIN 2.1 (L) 10/16/2021  ? BILITOT 0.7 10/16/2021  ? ALKPHOS 114 10/16/2021  ? AST 39 10/16/2021  ? ALT 103 (H) 10/16/2021  ? ANIONGAP 9 10/16/2021  ? ? ?GFR: ?Estimated Creatinine Clearance: 54.3 mL/min (A) (by C-G formula based on SCr of 0.43 mg/dL (L)). ? ?No results found for this or any previous visit (from the past 240 hour(s)).  ? ? ?Radiology Studies: ?No results found. ? ? ? LOS: 22 days  ? ? ?Cordelia Poche, MD ?[facility] ?10/17/2021, 6:59 PM ? ? ?If 7PM-7AM, please contact night-coverage ?[email or web address] ? ?

## 2021-10-18 ENCOUNTER — Encounter (HOSPITAL_COMMUNITY): Payer: Medicare Other

## 2021-10-18 DIAGNOSIS — E43 Unspecified severe protein-calorie malnutrition: Secondary | ICD-10-CM | POA: Diagnosis not present

## 2021-10-18 DIAGNOSIS — G9340 Encephalopathy, unspecified: Secondary | ICD-10-CM | POA: Diagnosis not present

## 2021-10-18 DIAGNOSIS — J441 Chronic obstructive pulmonary disease with (acute) exacerbation: Secondary | ICD-10-CM | POA: Diagnosis not present

## 2021-10-18 DIAGNOSIS — J9601 Acute respiratory failure with hypoxia: Secondary | ICD-10-CM | POA: Diagnosis not present

## 2021-10-18 LAB — GLUCOSE, CAPILLARY
Glucose-Capillary: 114 mg/dL — ABNORMAL HIGH (ref 70–99)
Glucose-Capillary: 137 mg/dL — ABNORMAL HIGH (ref 70–99)
Glucose-Capillary: 115 mg/dL — ABNORMAL HIGH (ref 70–99)
Glucose-Capillary: 144 mg/dL — ABNORMAL HIGH (ref 70–99)
Glucose-Capillary: 98 mg/dL (ref 70–99)

## 2021-10-18 NOTE — Progress Notes (Signed)
? ?PROGRESS NOTE ? ? ? ?[name]  [id number] DOB: 1955/08/11 DOA: 09/24/2021 ?PCP: Everardo Beals, NP ? ? ?Brief Narrative: ? 66 y.o. female with medical history significant for COPD, uncontrolled anxiety. ?Patient presented to the ED with complaints of difficulty breathing, cough, wheezing of about 1 week duration.  She also reports wheezing, and fatigue.  She reports O2 sats down to 78% on room air. ?She reports she was diagnosed with pneumonia yesterday, given a shot of antibiotic continue on antibiotics and steroids. ?Despite use of inhalers, breathing continues to worsen.  No leg swelling no chest pain.  Patient has a history of anxiety and panic attacks. ?EMS reports patient had rhonchi, O2 sats in the 80s on arrival to the ED.   In the ED, patient was placed BiPAP. ?She was subsequently weaned to to [facility], but in the afternoon 4/19, she began having sob again with tachypnea for which she was placed back on BiPAP. ?ABG showed hypercarbic respiratory failure 7.09/105/232/31 on biPAP, so she was intubated.  PCCM was consulted to assist. ? ? ?Assessment and Plan: ? ?Acute respiratory failure with hypoxia and hypercapnia ?Tracheostomy/vent dependent respiratory failure ?Patient admitted initially with COPD exacerbation and managed on BiPAP. Worsening respiratory failure lead to [facility] consult and requirement for mechanical ventilation. Patient intubated on 4/19. Prolonged ventilator wean and so tracheostomy performed on 5/1. ?-Continue trach care per PCCM ?-Trach collar, 5 L/min ? ?Hospital acquired pneumonia ?Tracheal aspirate (4/27) significant for klebsiella pneumoniae infection. Patient completed 6 days of antibiotic therapy with Ceftriaxone/Cefepime. ? ?COPD exacerbation ?Initial problem which led to acute respiratory failure and mechanical ventilation. Patient treated with Steroids and nebulizer treatments. Exacerbation resolved. ?-Continue Brovana, Pulmicort scheduled and Xopenex PRN ? ?Acute  systolic heart failure ?LVEF of 40% on recent Transthoracic Echocardiogram with focal WMA. Cardiology consulted with recommendation for outpatient ischemic evaluation. ? ?Acute metabolic encephalopathy ?Resolved. Managed with Seroquel which is being decreased. ? ?Severe anxiety ?Continue Klonopin and Seroquel ? ?Thrombocytopenia ?Transient in setting of acute illness. Improved. ? ?Severe malnutrition ?Cortrak feeding tube placed on 4/28. Speech therapy on board. Discussed with patient and she is agreeable to PEG tube if she is unable to transition to oral intake. SLP planing FEES. ? ?Hyperlipidemia ?-Continue Crestor ? ?Hyperglycemia ?Hemoglobin A1C of 5.4% ? ? ?DVT prophylaxis: Lovenox ?Code Status:   Code Status: Full Code ?Family Communication: None at bedside ?Disposition Plan: Discharge hopefully to [facility] when bed is available ? ? ?Consultants:  ?PCCM ?Cardiology ? ?Procedures:  ?Transthoracic Echocardiogram (09/27/21) ? ?Antimicrobials: ?Ceftriaxone ?Cefepime  ? ? ?Subjective: ?No issues today. ? ?Objective: ?BP 114/80 (BP Location: Left Arm)   Pulse (!) 105   Temp 98 ?F (36.7 ?C)   Resp 19   Ht 5\' 1"  (1.549 m)   Wt 53.6 kg   SpO2 95%   BMI 22.33 kg/m?  ? ?Examination: ? ?General exam: Appears calm and comfortable ?Respiratory system: Diminished but clear. Respiratory effort normal. ?Cardiovascular system: S1 & S2 heard, RRR. No murmurs, rubs, gallops or clicks. ?Gastrointestinal system: Abdomen is nondistended, soft and nontender. Normal bowel sounds heard. ?Central nervous system: Alert and oriented. 4/5 bilateral plantar flexion and 1/5 bilateral dorsiflexion ?Musculoskeletal: No edema. No calf tenderness ?Skin: No cyanosis. No rashes ?Psychiatry: Judgement and insight appear normal. Mood & affect appropriate.  ? ? ?Data Reviewed: I have personally reviewed following labs and imaging studies ? ?CBC ?Lab Results  ?Component Value Date  ? WBC 8.0 10/16/2021  ? RBC 3.36 (L) 10/16/2021  ?  HGB 10.9 (L)  10/16/2021  ? HCT 32.9 (L) 10/16/2021  ? MCV 97.9 10/16/2021  ? MCH 32.4 10/16/2021  ? PLT 126 (L) 10/16/2021  ? MCHC 33.1 10/16/2021  ? RDW 14.2 10/16/2021  ? LYMPHSABS 0.8 10/16/2021  ? MONOABS 0.4 10/16/2021  ? EOSABS 0.2 10/16/2021  ? BASOSABS 0.0 10/16/2021  ? ? ? ?Last metabolic panel ?Lab Results  ?Component Value Date  ? NA 136 10/16/2021  ? K 3.6 10/16/2021  ? CL 99 10/16/2021  ? CO2 28 10/16/2021  ? BUN 16 10/16/2021  ? CREATININE 0.43 (L) 10/16/2021  ? GLUCOSE 121 (H) 10/16/2021  ? GFRNONAA >60 10/16/2021  ? GFRAA >60 05/14/2019  ? CALCIUM 8.5 (L) 10/16/2021  ? PHOS 3.1 10/08/2021  ? PROT 6.8 10/16/2021  ? ALBUMIN 2.1 (L) 10/16/2021  ? BILITOT 0.7 10/16/2021  ? ALKPHOS 114 10/16/2021  ? AST 39 10/16/2021  ? ALT 103 (H) 10/16/2021  ? ANIONGAP 9 10/16/2021  ? ? ?GFR: ?Estimated Creatinine Clearance: 54.3 mL/min (A) (by C-G formula based on SCr of 0.43 mg/dL (L)). ? ?No results found for this or any previous visit (from the past 240 hour(s)).  ? ? ?Radiology Studies: ?No results found. ? ? ? LOS: 23 days  ? ? ?Cordelia Poche, MD ?[facility] ?10/18/2021, 12:05 PM ? ? ?If 7PM-7AM, please contact night-coverage ?[email or web address] ? ?

## 2021-10-18 NOTE — Procedures (Signed)
Objective Swallowing Evaluation: Type of Study: FEES-Fiberoptic Endoscopic Evaluation of Swallow ?  ?Patient Details  ?Name: [name] ?MRN: [id number] ?Date of Birth: 1955/10/06 ? ?Today's Date: 10/18/2021 ?Time: SLP Start Time (ACUTE ONLY): 1037 ?-SLP Stop Time (ACUTE ONLY): 1230 ? ?SLP Time Calculation (min) (ACUTE ONLY): 113 min ? ? ?Past Medical History:  ?Past Medical History:  ?Diagnosis Date  ? Anxiety   ? Chronic back pain   ? COPD (chronic obstructive pulmonary disease) (HCC)   ? Tobacco abuse   ? ?Past Surgical History:  ?Past Surgical History:  ?Procedure Laterality Date  ? ABDOMINAL HYSTERECTOMY    ? BACK SURGERY    ? ILEO LOOP NEOBLADDER    ? VESICOVAGINAL FISTULA REPAIR    ? ?HPI: Pt is a 66 yo female presenting to [facility] with acute hypoxic/hypercarbic respiratory failure requiring intubation 4/19 followed by transfer to [facility] after new finding of LV dysfunction. Trach 5/1. PMH includes: COPD, previously on home O2 but "took herself off," former smoker (quit 2021), uncontrolled anxiety ?  ?Subjective: sleepy but cooperative ? ? ? Recommendations for follow up therapy are one component of a multi-disciplinary discharge planning process, led by the attending physician.  Recommendations may be updated based on patient status, additional functional criteria and insurance authorization. ? ?Assessment / Plan / Recommendation ? ? ?  10/18/2021  ?  1:00 PM  ?Clinical Impressions  ?Clinical Impression Pt demonstrates minimal dysphagia mostly secondary to dry oral mucosa following prolonged NPO status. Pt very anxious during FEES, RR not measured, but appeared high. Pt independently took small sips of nectar and thin. There was no airway invasion though pt did have intermittent coughing that did not produce any secretions and was not related to PO intake. Pt did have mild erythema of left vocal fold, but tissue integity was good. Pt needed a liquid wash with solids due to (improving) xerostomia. Otherwise pt  tolerated PO without difficulty. Recommend pt initaite a regular diet and thin liquids. PMSV to be worn while eating and drinking. Will f/u for tolerance.  ?SLP Visit Diagnosis Dysphagia, oropharyngeal phase (R13.12)  ?Impact on safety and function Mild aspiration risk  ? ?   ? ?  10/18/2021  ?  1:00 PM  ?Treatment Recommendations  ?Treatment Recommendations Therapy as outlined in treatment plan below  ?   ? ?  10/15/2021  ?  2:44 PM  ?Prognosis  ?Prognosis for Safe Diet Advancement Good  ? ? ? ?  10/18/2021  ?  1:00 PM  ?Diet Recommendations  ?SLP Diet Recommendations Regular solids;Thin liquid  ?Liquid Administration via Cup;Straw  ?Medication Administration Whole meds with puree  ?Compensations Slow rate;Small sips/bites  ?Postural Changes Remain semi-upright after after feeds/meals (Comment);Seated upright at 90 degrees  ?   ? ? ?  10/18/2021  ?  1:00 PM  ?Other Recommendations  ?Oral Care Recommendations Oral care BID  ?Other Recommendations Place PMSV during PO intake  ?Follow Up Recommendations Acute inpatient rehab (3hours/day)  ?Assistance recommended at discharge Frequent or constant Supervision/Assistance  ?Functional Status Assessment Patient has had a recent decline in their functional status and demonstrates the ability to make significant improvements in function in a reasonable and predictable amount of time.  ? ? ? ?  10/18/2021  ?  1:00 PM  ?Frequency and Duration   ?Speech Therapy Frequency (ACUTE ONLY) min 2x/week  ?Treatment Duration 2 weeks  ?   ? ? ?  10/18/2021  ?  1:00 PM  ?Oral Phase  ?  Oral Phase Impaired  ?Oral - Nectar Cup [facility]  ?Oral - Thin Cup [facility]  ?Oral - Thin Straw WFL  ?Oral - Puree WFL  ?Oral - Regular Delayed oral transit;Decreased bolus cohesion  ?  ? ?  10/18/2021  ?  1:00 PM  ?Pharyngeal Phase  ?Pharyngeal Phase WFL  ?  ? ?   ? View : No data to display.  ?  ?  ?  ? ? ? ?[name], [name] ?10/18/2021, 1:09 PM ?  ? ?   ?   ?   ?   ?   ? ?  ? ?  ?  ?   ?  ? ? ?  ? ? ?

## 2021-10-18 NOTE — Care Management Important Message (Signed)
Important Message ? ?Patient Details  ?Name: [name] ?MRN: [id number] ?Date of Birth: 05-04-1956 ? ? ?Medicare Important Message Given:  Yes ? ? ? ? ?[name] ?10/18/2021, 3:18 PM ?

## 2021-10-18 NOTE — TOC Progression Note (Addendum)
Transition of Care (TOC) - Initial/Assessment Note  ? ? ?Patient Details  ?Name: [name] ?MRN: [id number] ?Date of Birth: 1955/09/04 ? ?Transition of Care (TOC) CM/SW Contact:    ?Paulene Floor Nijah Orlich, LCSWA ?Phone Number: ?10/18/2021, 11:26 AM ? ?Clinical Narrative:                 ?CSW contacted Anderson Malta with admissions at [facility] to inquire about the [facility] appeal and was informed that the insurance company will notify [facility] when a decision is made. ? ?15:20-  CSW informed that the appeal process is complete and the original decision (denial) was upheld.  The case is now being appealed at a higher level.   ? ?TOC will continue to follow. ? ?Expected Discharge Plan: [place] (LTAC) ?Barriers to Discharge: Continued Medical Work up ? ? ?Patient Goals and CMS Choice ?  ?  ?Choice offered to / list presented to : Spouse ? ?Expected Discharge Plan and Services ?Expected Discharge Plan: [place] (LTAC) ?In-house Referral: Clinical Social Work ?  ?Post Acute Care Choice: Long Term Acute Care (LTAC) ?Living arrangements for the past 2 months: [place] ?                ?  ?  ?  ?  ?  ?  ?  ?  ?  ?  ? ?Prior Living Arrangements/Services ?Living arrangements for the past 2 months: [place] ?Lives with:: Spouse ?Patient language and need for interpreter reviewed:: No ?       ?Need for Family Participation in Patient Care: Yes (Comment) ?Care giver support system in place?: Yes (comment) ?Current home services: Other (comment) (none) ?Criminal Activity/Legal Involvement Pertinent to Current Situation/Hospitalization: No - Comment as needed ? ?Activities of Daily Living ?Home Assistive Devices/Equipment: None ?ADL Screening (condition at time of admission) ?Patient's cognitive ability adequate to safely complete daily activities?: Yes ?Is the patient deaf or have difficulty hearing?: No ?Does the patient have difficulty seeing, even when wearing glasses/contacts?: No ?Does the patient  have difficulty concentrating, remembering, or making decisions?: No ?Patient able to express need for assistance with ADLs?: Yes ?Does the patient have difficulty dressing or bathing?: No ?Independently performs ADLs?: Yes (appropriate for developmental age) ?Does the patient have difficulty walking or climbing stairs?: Yes ?Weakness of Legs: Both ?Weakness of Arms/Hands: None ? ?Permission Sought/Granted ?  ?  ?   ?   ?   ?   ? ?Emotional Assessment ?Appearance:: Appears stated age ?Attitude/Demeanor/Rapport: Unable to Assess ?Affect (typically observed): Unable to Assess ?Orientation: :  (intubated) ?Alcohol / Substance Use: Not Applicable ?Psych Involvement: No (comment) ? ?Admission diagnosis:  Acute respiratory distress [R06.03] ?COPD exacerbation ([place]) [J44.1] ?COPD with acute exacerbation ([place]) [J44.1] ?Acute exacerbation of COPD with asthma ([place]) [J44.1, [id number] ?Patient Active Problem List  ? Diagnosis Date Noted  ? Acute encephalopathy   ? Protein-calorie malnutrition, severe 10/03/2021  ? Heart failure with mid-range ejection fraction (HFmEF) ([place]) 09/28/2021  ? Hypernatremia 09/27/2021  ? Acute respiratory failure with hypoxia and hypercapnia ([place]) 09/26/2021  ? Lactic acidosis 09/26/2021  ? GERD (gastroesophageal reflux disease) 09/25/2021  ? Hyperglycemia 09/25/2021  ? Anxiety 09/24/2021  ? Physical deconditioning 02/06/2021  ? Stridor 12/28/2020  ? Shortness of breath 12/19/2020  ? Hypokalemia 05/01/2019  ? SBO (small bowel obstruction) ([place]) 04/28/2019  ? SIRS (systemic inflammatory response syndrome) ([place]) 04/28/2019  ? Tobacco abuse 04/28/2019  ? Chronic pain 04/28/2019  ? AKI (  acute kidney injury) ([place]) 04/28/2019  ? Acute respiratory failure ([place]) 08/17/2013  ? COPD exacerbation ([place]) 08/16/2013  ? ?PCP:  Everardo Beals, NP ?Pharmacy:   ?[id number] [place], [place] - [address] ?[address] ?[place] [address] ?Phone: [phone] Fax:  [phone] ? ?[id number] - [place] ?[place] [address] ?Phone: [phone] Fax: [phone] ? ?[id number] - [place], [address] ?[place] ?[place] [address] ?Phone: [phone] Fax: [phone] ? ?[place], [place] ?[address] ?[address] ?[place] [address] ?Phone: [phone] Fax: [phone] ? ? ? ? ?Social Determinants of Health (SDOH) Interventions ?  ? ?Readmission Risk Interventions ?   ? View : No data to display.  ?  ?  ?  ? ? ? ?

## 2021-10-18 NOTE — Progress Notes (Signed)
Speech Language Pathology Treatment: [name] Speaking valve  ?Patient Details ?Name: [name] ?MRN: [id number] ?DOB: [birth year] ?Today's Date: 10/18/2021 ?Time: 8502-7741 ?SLP Time Calculation (min) (ACUTE ONLY): 113 min ? ?Assessment / Plan / Recommendation ?Clinical Impression ? SLP with pt for almost 2 hours today, pt wore PMSV throughout the session with occasional increased anxiety and HR due to activity (pt had a bath and a FEES during that time period), but no signs of intolerance of PMSV. Pt enjoyed an effective cough and ability to communicate wants and needs. Vocal quality was breathy at times, but pt instinctively increased effort at 2 word utterance to increase volume and intelligibility. Pt may wear PMSV all waking hours with intermittent supervision. Her comfort with PMSV would likely improve with a cuffless trach. Will follow for further assistance and teach of placement and removal of valve.   ?HPI HPI: Pt is a 66 yo female presenting to [facility] with acute hypoxic/hypercarbic respiratory failure requiring intubation 4/19 followed by transfer to [facility] after new finding of LV dysfunction. Trach 5/1. PMH includes: COPD, previously on home O2 but "took herself off," former smoker (quit 2021), uncontrolled anxiety ?  ?   ?SLP Plan ? Continue with current plan of care ? ?  ?  ?Recommendations for follow up therapy are one component of a multi-disciplinary discharge planning process, led by the attending physician.  Recommendations may be updated based on patient status, additional functional criteria and insurance authorization. ?  ? ?Recommendations  ?   ?   ? Patient may use Passy-Muir Speech Valve: During all therapies with supervision;During all waking hours (remove during sleep);During PO intake/meals ?PMSV Supervision: Intermittent ?MD: Please consider changing trach tube to : Cuffless  ?   ? ? ? ? Oral Care Recommendations: Oral care BID ?Follow Up Recommendations: Acute inpatient rehab  (3hours/day) ?Plan: Continue with current plan of care ? ? ? ? ?  ?  ? ? ?[name], [name] ? ?10/18/2021, 12:56 PM ?

## 2021-10-18 NOTE — Progress Notes (Signed)
Occupational Therapy Treatment ?Patient Details ?Name: [name] ?MRN: [id number] ?DOB: [birth year] ?Today's Date: 10/18/2021 ? ? ?History of present illness 66 y/o F reports diagnosis of PNA on 4/16 Admitted 4/17 to [facility], ICU with acute hypoxic/hypercarbic respiratory failure, failed BiPAP and required mechanical ventilation, transferred to [facility] 4/21 due to new finding of LV dysfunction Echo 4/20 with EF 40% , akinesis of apex and septal wall. . Failed weaning attempt 4/28. Trach placed on 5/1. PMH significant for COPD, tobacco use, HLD, GRED, and uncontrolled anxiety. ?  ?OT comments ? Pt making incremental progress with OT goals. This session pt was able to sit EOB with min A- min guard statically. She worked on dynamic balance sitting, reaching slightly out of her base of support, as well as working on BLE ROM. Pt agreeable to transfer to recliner, however when starting to set up for [facility] transfer therapist noted that sling in room was loops instead of clips, which are unsafe to use with the maximove. Continuing to recommend LTACH and OT will continue to follow acutely.    ? ?Recommendations for follow up therapy are one component of a multi-disciplinary discharge planning process, led by the attending physician.  Recommendations may be updated based on patient status, additional functional criteria and insurance authorization. ?   ?Follow Up Recommendations ? OT at Long-term acute care hospital  ?  ?Assistance Recommended at Discharge Frequent or constant Supervision/Assistance  ?Patient can return home with the following ? Two people to help with walking and/or transfers;Two people to help with bathing/dressing/bathroom;Assistance with cooking/housework;Assistance with feeding;Direct supervision/assist for medications management;Direct supervision/assist for financial management;Assist for transportation;Help with stairs or ramp for entrance ?  ?Equipment Recommendations ? Other (comment) (TBD)  ?   ?Recommendations for Other Services   ? ?  ?Precautions / Restrictions Precautions ?Precautions: Fall ?Precaution Comments: trach, cortrak ?Restrictions ?Weight Bearing Restrictions: No  ? ? ?  ? ?Mobility Bed Mobility ?Overal bed mobility: Needs Assistance ?Bed Mobility: Rolling, Sidelying to Sit ?Rolling: Max assist ?Sidelying to sit: Max assist, +2 for physical assistance ?  ?Sit to supine: Mod assist ?  ?General bed mobility comments: Able to roll L/R with maxA for pericare and initiating log roll technique. maxA+2 to come to EOB with log roll. Patient initiating LE movement off bed but no trunk initiation. ModA to return to supine with patient able to control trunk descent but required assist for LE management ?  ? ?Transfers ?  ?  ?  ?  ?  ?  ?  ?  ?  ?General transfer comment: deferred due to after completing pericare and rearranging room, therapists noted sling had loops rather than clips and floor was out of stock so changed plan to sitting EOB to work on sitting tolerance and sitting balance ?  ?  ?Balance Overall balance assessment: Needs assistance ?Sitting-balance support: Feet unsupported, Bilateral upper extremity supported ?Sitting balance-Leahy Scale: Fair ?Sitting balance - Comments: initially modA to maintain sitting balance but progressed to sitting with close min guard due to inconsistent trunk control. Able to sit EOB x 8 minutes prior to requesting to return to supine due to fatigue ?  ?  ?  ?  ?  ?  ?  ?  ?  ?  ?  ?  ?  ?  ?  ?   ? ?ADL either performed or assessed with clinical judgement  ? ?ADL Overall ADL's : Needs assistance/impaired ?  ?  ?  ?  ?  ?  ?  ?  ?  ?  ?  ?  ?  Toilet Transfer: Total assistance ?Toilet Transfer Details (indicate cue type and reason): Total A in bed, pt able to assist some with rolling ?  ?  ?  ?  ?  ?General ADL Comments: Worked on sitting balance and ROM of BUE and BLE, as well as following commands ?  ? ?Extremity/Trunk Assessment   ?  ?  ?  ?  ?  ? ?Vision    ?  ?  ?Perception   ?  ?Praxis   ?  ? ?Cognition Arousal/Alertness: Awake/alert ?Behavior During Therapy: Anxious ?Overall Cognitive Status: Difficult to assess ?Area of Impairment: Attention, Following commands, Problem solving, Awareness ?  ?  ?  ?  ?  ?  ?  ?  ?  ?Current Attention Level: [place] ?  ?Following Commands: Follows one step commands consistently, Follows one step commands with increased time ?  ?Awareness: Emergent ?Problem Solving: Slow processing, Requires verbal cues, Requires tactile cues ?General Comments: follows simple commands but with increased time due to processing. Fatigues quickly. ?  ?  ?   ?Exercises Exercises: General Upper Extremity ?General Exercises - Upper Extremity ?Shoulder Flexion: AAROM, Both, 10 reps, Seated ?Shoulder ABduction: AAROM, 10 reps, Seated ?Elbow Flexion: AAROM, Both, 10 reps, Seated ?Elbow Extension: AAROM, Both, 10 reps, Seated ?Wrist Flexion: AAROM, 5 reps, Seated ?Wrist Extension: AAROM, 5 reps, Seated ?Digit Composite Flexion: AROM, Both, 10 reps, Seated ?Composite Extension: AAROM, Both, 10 reps, Seated ? ?  ?Shoulder Instructions   ? ? ?  ?General Comments    ? ? ?Pertinent Vitals/ Pain       Pain Assessment ?Pain Assessment: No/denies pain ?Faces Pain Scale: No hurt ?Pain Intervention(s): Monitored during session ? ?Home Living   ?  ?  ?  ?  ?  ?  ?  ?  ?  ?  ?  ?  ?  ?  ?  ?  ?  ?  ? ?  ?Prior Functioning/Environment    ?  ?  ?  ?   ? ?Frequency ? Min 2X/week  ? ? ? ? ?  ?Progress Toward Goals ? ?OT Goals(current goals can now be found in the care plan section) ? Progress towards OT goals: Progressing toward goals ? ?Acute Rehab OT Goals ?Patient Stated Goal: none stated ?OT Goal Formulation: With patient ?Time For Goal Achievement: 10/25/21 ?Potential to Achieve Goals: Good ?ADL Goals ?Additional ADL Goal #1: Pt will complete rolling in bed both sides with mod assist as a precurser to sitting EOB. ?Additional ADL Goal #2: Pt will visually scan room  and find 3 objects with independence. ?Additional ADL Goal #3: Pt will complete AAROM of BUE with 50%/mod assist. ?Additional ADL Goal #4: Pt will hold and utilize tooth brush sponge to sucessfully brush her teeth and mouth with min A.  ?Plan Discharge plan remains appropriate   ? ?Co-evaluation ? ? ? PT/OT/SLP Co-Evaluation/Treatment: Yes ?Reason for Co-Treatment: For patient/therapist safety;To address functional/ADL transfers;Complexity of the patient's impairments (multi-system involvement) ?PT goals addressed during session: Mobility/safety with mobility;Balance ?OT goals addressed during session: ADL's and self-care;Strengthening/ROM ?  ? ?  ?AM-PAC OT "6 Clicks" Daily Activity     ?Outcome Measure ? ? Help from another person eating meals?: Total ?Help from another person taking care of personal grooming?: Total ?Help from another person toileting, which includes using toliet, bedpan, or urinal?: Total ?Help from another person bathing (including washing, rinsing, drying)?: Total ?Help from another person to put on and taking off regular  upper body clothing?: Total ?Help from another person to put on and taking off regular lower body clothing?: Total ?6 Click Score: 6 ? ?  ?End of Session Equipment Utilized During Treatment: Oxygen ? ?OT Visit Diagnosis: Unsteadiness on feet (R26.81);Other abnormalities of gait and mobility (R26.89);Muscle weakness (generalized) (M62.81) ?  ?Activity Tolerance Patient tolerated treatment well ?  ?Patient Left in bed;with call bell/phone within reach;with bed alarm set ?  ?Nurse Communication Mobility status ?  ? ?   ? ?Time: 6967-8938 ?OT Time Calculation (min): 34 min ? ?Charges: OT General Charges ?$OT Visit: 1 Visit ?OT Treatments ?$Therapeutic Activity: 8-22 mins ? ?Katalyna Socarras H., OTR/L ?Acute Rehabilitation ? ?Jayna Mulnix Elane Bing Plume ?10/18/2021, 7:19 PM ?

## 2021-10-18 NOTE — Progress Notes (Signed)
Physical Therapy Treatment ?Patient Details ?Name: [name] ?MRN: [id number] ?DOB: 1956/04/15 ?Today's Date: 10/18/2021 ? ? ?History of Present Illness 66 y/o F reports diagnosis of PNA on 4/16 Ad[facility], ICU with acute hypoxic/hypercarbic respiratory failure, failed BiPAP and required mechanical ventilation, transferred to [facility] 4/21 due to new finding of LV dysfunction Echo 4/20 with EF 40% , akinesis of apex and septal wall. . Failed weaning attempt 4/28. Trach placed on 5/1. PMH significant for COPD, tobacco use, HLD, GRED, and uncontrolled anxiety. ? ?  ?PT Comments  ? ? Patient making slow incremental progress this session. Patient required maxA to roll L/R for pericare and linen change. Attempted to transfer OOB this session with Maximove but after performing pericare and rearranging room, therapists noted sling in room had loops rather than clips and floor was out of stock so changed plan to work on sitting tolerance and balance. Patient required maxA+2 for bed mobility but able to statically sit EOB with feet unsupported with close min guard x 8 minutes prior to requesting to return to supine due to fatigue. Patient participated in therex while sitting EOB. Seems to demonstrate trace muscle activation in bilateral tibialis anterior (R>L) so hoping to continue strengthening to improve ankle DF. D/c plan remains appropriate. VSS on trach collar 5L O2 FiO2 28%.  ? ?  ?Recommendations for follow up therapy are one component of a multi-disciplinary discharge planning process, led by the attending physician.  Recommendations may be updated based on patient status, additional functional criteria and insurance authorization. ? ?Follow Up Recommendations ? PT at Long-term acute care hospital ?  ?  ?Assistance Recommended at Discharge Frequent or constant Supervision/Assistance  ?Patient can return home with the following Two people to help with walking and/or transfers;Two people to help with  bathing/dressing/bathroom;Assist for transportation;Help with stairs or ramp for entrance ?  ?Equipment Recommendations ? BSC/3in1;Wheelchair (measurements PT);Wheelchair cushion (measurements PT);Other (comment);Hospital bed (hoyer lift)  ?  ?Recommendations for Other Services   ? ? ?  ?Precautions / Restrictions Precautions ?Precautions: Fall ?Precaution Comments: trach, cortrak ?Restrictions ?Weight Bearing Restrictions: No  ?  ? ?Mobility ? Bed Mobility ?Overal bed mobility: Needs Assistance ?Bed Mobility: Rolling, Sidelying to Sit ?Rolling: Max assist ?Sidelying to sit: Max assist, +2 for physical assistance ?  ?Sit to supine: Mod assist ?  ?General bed mobility comments: Able to roll L/R with maxA for pericare and initiating log roll technique. maxA+2 to come to EOB with log roll. Patient initiating LE movement off bed but no trunk initiation. ModA to return to supine with patient able to control trunk descent but required assist for LE management ?  ? ?Transfers ?  ?  ?  ?  ?  ?  ?  ?  ?  ?General transfer comment: deferred due to after completing pericare and rearranging room, therapists noted sling had loops rather than clips and floor was out of stock so changed plan to sitting EOB to work on sitting tolerance and sitting balance ?  ? ?Ambulation/Gait ?  ?  ?  ?  ?  ?  ?  ?  ? ? ?Stairs ?  ?  ?  ?  ?  ? ? ?Wheelchair Mobility ?  ? ?Modified Rankin (Stroke Patients Only) ?  ? ? ?  ?Balance Overall balance assessment: Needs assistance ?Sitting-balance support: Feet unsupported, Bilateral upper extremity supported ?Sitting balance-Leahy Scale: Fair ?Sitting balance - Comments: initially modA to maintain sitting balance but progressed to  sitting with close min guard due to inconsistent trunk control. Able to sit EOB x 8 minutes prior to requesting to return to supine due to fatigue ?  ?  ?  ?  ?  ?  ?  ?  ?  ?  ?  ?  ?  ?  ?  ?  ? ?  ?Cognition Arousal/Alertness: Awake/alert ?Behavior During Therapy:  Anxious ?Overall Cognitive Status: Difficult to assess ?Area of Impairment: Attention, Following commands, Problem solving, Awareness ?  ?  ?  ?  ?  ?  ?  ?  ?  ?Current Attention Level: [place] ?  ?Following Commands: Follows one step commands consistently, Follows one step commands with increased time ?  ?Awareness: Emergent ?Problem Solving: Slow processing, Requires verbal cues, Requires tactile cues ?General Comments: follows simple commands but with increased time due to processing. Fatigues quickly. ?  ?  ? ?  ?Exercises General Exercises - Lower Extremity ?Ankle Circles/Pumps: AAROM, Both, 5 reps, Seated ?Long Arc Quad: AAROM, Both, 5 reps, Seated ? ?  ?General Comments   ?  ?  ? ?Pertinent Vitals/Pain Pain Assessment ?Pain Assessment: Faces ?Faces Pain Scale: No hurt ?Pain Intervention(s): Monitored during session  ? ? ?Home Living   ?  ?  ?  ?  ?  ?  ?  ?  ?  ?   ?  ?Prior Function    ?  ?  ?   ? ?PT Goals (current goals can now be found in the care plan section) Acute Rehab PT Goals ?Patient Stated Goal: none stated ?PT Goal Formulation: With patient ?Time For Goal Achievement: 10/23/21 ?Potential to Achieve Goals: Fair ?Progress towards PT goals: Progressing toward goals ? ?  ?Frequency ? ? ? Min 3X/week ? ? ? ?  ?PT Plan Current plan remains appropriate  ? ? ?Co-evaluation PT/OT/SLP Co-Evaluation/Treatment: Yes ?Reason for Co-Treatment: For patient/therapist safety;To address functional/ADL transfers;Complexity of the patient's impairments (multi-system involvement) ?PT goals addressed during session: Mobility/safety with mobility;Balance ?  ?  ? ?  ?AM-PAC PT "6 Clicks" Mobility   ?Outcome Measure ? Help needed turning from your back to your side while in a flat bed without using bedrails?: A Lot ?Help needed moving from lying on your back to sitting on the side of a flat bed without using bedrails?: Total ?Help needed moving to and from a bed to a chair (including a wheelchair)?: Total ?Help needed  standing up from a chair using your arms (e.g., wheelchair or bedside chair)?: Total ?Help needed to walk in hospital room?: Total ?Help needed climbing 3-5 steps with a railing? : Total ?6 Click Score: 7 ? ?  ?End of Session Equipment Utilized During Treatment: Oxygen ?Activity Tolerance: Patient limited by fatigue ?Patient left: with call bell/phone within reach;in bed;with bed alarm set ?Nurse Communication: Mobility status;Need for lift equipment ?PT Visit Diagnosis: Other abnormalities of gait and mobility (R26.89);Muscle weakness (generalized) (M62.81);Difficulty in walking, not elsewhere classified (R26.2) ?  ? ? ?Time: [id number] ?PT Time Calculation (min) (ACUTE ONLY): 34 min ? ?Charges:  $Therapeutic Activity: 8-22 mins          ?          ? ?Mance Vallejo A. Gilford Rile, PT, DPT ?Acute Rehabilitation Services ?Pager [phone] ?Office [phone] ? ? ? ?Lashae Wollenberg A Pacer Dorn ?10/18/2021, 5:24 PM ? ?

## 2021-10-19 DIAGNOSIS — I429 Cardiomyopathy, unspecified: Secondary | ICD-10-CM | POA: Diagnosis not present

## 2021-10-19 DIAGNOSIS — J441 Chronic obstructive pulmonary disease with (acute) exacerbation: Secondary | ICD-10-CM | POA: Diagnosis not present

## 2021-10-19 DIAGNOSIS — J9601 Acute respiratory failure with hypoxia: Secondary | ICD-10-CM | POA: Diagnosis not present

## 2021-10-19 DIAGNOSIS — J9602 Acute respiratory failure with hypercapnia: Secondary | ICD-10-CM | POA: Diagnosis not present

## 2021-10-19 DIAGNOSIS — G9340 Encephalopathy, unspecified: Secondary | ICD-10-CM | POA: Diagnosis not present

## 2021-10-19 DIAGNOSIS — E43 Unspecified severe protein-calorie malnutrition: Secondary | ICD-10-CM | POA: Diagnosis not present

## 2021-10-19 LAB — GLUCOSE, CAPILLARY
Glucose-Capillary: 101 mg/dL — ABNORMAL HIGH (ref 70–99)
Glucose-Capillary: 115 mg/dL — ABNORMAL HIGH (ref 70–99)
Glucose-Capillary: 118 mg/dL — ABNORMAL HIGH (ref 70–99)
Glucose-Capillary: 126 mg/dL — ABNORMAL HIGH (ref 70–99)
Glucose-Capillary: 137 mg/dL — ABNORMAL HIGH (ref 70–99)
Glucose-Capillary: 139 mg/dL — ABNORMAL HIGH (ref 70–99)

## 2021-10-19 LAB — POTASSIUM: Potassium: 4 mmol/L (ref 3.5–5.1)

## 2021-10-19 LAB — MAGNESIUM: Magnesium: 2.1 mg/dL (ref 1.7–2.4)

## 2021-10-19 MED ORDER — PROSOURCE TF PO LIQD
45.0000 mL | Freq: Two times a day (BID) | ORAL | Status: DC
  Administered 2021-10-19 – 2021-10-31 (×21): 45 mL
  Filled 2021-10-19 (×23): qty 45

## 2021-10-19 MED ORDER — ENSURE ENLIVE PO LIQD
237.0000 mL | Freq: Two times a day (BID) | ORAL | Status: DC
  Administered 2021-10-19 – 2021-10-29 (×10): 237 mL via ORAL

## 2021-10-19 MED ORDER — FREE WATER
100.0000 mL | Status: DC
  Administered 2021-10-19 – 2021-10-30 (×64): 100 mL

## 2021-10-19 MED ORDER — OSMOLITE 1.5 CAL PO LIQD
1000.0000 mL | ORAL | Status: DC
Start: 2021-10-19 — End: 2021-10-22
  Administered 2021-10-19 – 2021-10-21 (×3): 1000 mL
  Filled 2021-10-19 (×4): qty 1000

## 2021-10-19 MED ORDER — METOPROLOL SUCCINATE ER 25 MG PO TB24
12.5000 mg | ORAL_TABLET | Freq: Every day | ORAL | Status: DC
Start: 2021-10-19 — End: 2021-10-25
  Administered 2021-10-19 – 2021-10-24 (×6): 12.5 mg via ORAL
  Filled 2021-10-19 (×6): qty 1

## 2021-10-19 NOTE — Progress Notes (Signed)
Nutrition Follow-up ? ?DOCUMENTATION CODES:  ? ?Severe malnutrition in context of chronic illness ? ?INTERVENTION:  ? ?Tube Feeding via Cortrak: transition to Nocutrnal TF, discussed with Attending MD ?Osmolite 1.5 at 65 ml/hr x 12 hours ?Continue Pro-Source TF 45 mL BID  ?This provides 71 g of protein, 1250 kcals, 593 mL of free waer ? ?Decrease free water flushes for now to 100 mL q 4 hours as pt able to take water by mouth ? ?Ensure Enlive po BID, each supplement provides 350 kcal and 20 grams of protein. ? ?Calorie Count over the weekend to better assess po intake.  ? ?Continue Regular diet (no restrictions) ? ?NUTRITION DIAGNOSIS:  ? ?Severe Malnutrition related to chronic illness (COPD) as evidenced by severe muscle depletion, severe fat depletion. ? ?Being addressed via TF  ?GOAL:  ? ?Patient will meet greater than or equal to 90% of their needs ? ?Progressing ? ?MONITOR:  ? ?Vent status, TF tolerance, Labs ? ?REASON FOR ASSESSMENT:  ? ?Ventilator ?  ? ?ASSESSMENT:  ? ?Pt admitted with difficulty breathing d/t acute respiratory failure with hypoxia and hypercapnia. Pt with diagnosis of PNA 4/16. PMH significant for COPD, uncontrolled anxiety. ? ?4/18 Admitted ?4/28 Cortrak placed ?5/01 Trach ?5/11 Diet advanced to Regular ?5/12 Change to nocturnal TF via Cortrak ? ?Tolerating trach collar ?Pt complaining of pain to her backside on visit today; RN notified ? ?Lunch tray being delivered on visit today; pt stating she does not have an appetite. Pt ate 0% at breakfast this AM. Recorded po intake yesterday post diet advancement 0% at dinner. Pt ended up eating 15% of lunch today ? ?Discussed utilizing oral nutrition supplements to maximize oral nutrition in order to wean off TF. Pt initially was not interested in Boost/Ensure but after talking with pt and pt's sister, pt was agreeable to at least trying.  ? ?Tolerating Osmolite 1.2 at 55 ml/hr, Pro-Source TF daily via Cortrak ? ?Noted free water flush 200 mL q 4  hours; plan to decrease give pt able to take po fluids now.  ? ?Labs: potassium 4.0 (L),  ?Meds: ss novolog, lasix, miralax, colace ? ?Diet Order:   ?Diet Order   ? ?       ?  Diet regular Room service appropriate? Yes; Fluid consistency: Thin  Diet effective now       ?  ? ?  ?  ? ?  ? ? ?EDUCATION NEEDS:  ? ?No education needs have been identified at this time ? ?Skin:  Skin Assessment: Reviewed RN Assessment (MASD to buttocks) ? ?Last BM:  5/9 type 7 ? ?Height:  ? ?Ht Readings from Last 1 Encounters:  ?09/25/21 5\' 1"  (1.549 m)  ? ? ?Weight:  ? ?Wt Readings from Last 1 Encounters:  ?10/19/21 53.6 kg  ? ? ?Ideal Body Weight:    ? ?BMI:  Body mass index is 22.33 kg/m?. ? ?Estimated Nutritional Needs:  ? ?Kcal:  1600-1800 ? ?Protein:  75-90g ? ?Fluid:  >/=1.5L ? ? ? ?12/19/21 MS, RDN, LDN, CNSC ?Registered Dietitian III ?Clinical Nutrition ?RD Pager and On-Call Pager Number Located in [place]  ? ?

## 2021-10-19 NOTE — Progress Notes (Signed)
?   10/19/21 0041  ?Assess: MEWS Score  ?Temp 97.7 ?F (36.5 ?C)  ?BP 136/70  ?Pulse Rate (!) 118  ?Resp 18  ?SpO2 92 %  ?O2 Device Tracheostomy Collar  ?Assess: MEWS Score  ?MEWS Temp 0  ?MEWS Systolic 0  ?MEWS Pulse 2  ?MEWS RR 0  ?MEWS LOC 0  ?MEWS Score 2  ?MEWS Score Color Yellow  ?Assess: if the MEWS score is Yellow or Red  ?Were vital signs taken at a resting state? No  ?Focused Assessment No change from prior assessment  ?Early Detection of Sepsis Score *See Row Information* Low  ?MEWS guidelines implemented *See Row Information* No, previously yellow, continue vital signs every 4 hours  ? ? ?Charge RN and Dr. Loney Loh are aware of short run of Vtach. No acute chages, will continue to monitor.  ?

## 2021-10-19 NOTE — Progress Notes (Signed)
? ?PROGRESS NOTE ? ? ? ?KALISI PAPILLON  [id number] DOB: February 20, 1956 DOA: 09/24/2021 ?PCP: Everardo Beals, NP ? ? ?Brief Narrative: ? 66 y.o. female with medical history significant for COPD, uncontrolled anxiety. ?Patient presented to the ED with complaints of difficulty breathing, cough, wheezing of about 1 week duration.  She also reports wheezing, and fatigue.  She reports O2 sats down to 78% on room air. ?She reports she was diagnosed with pneumonia yesterday, given a shot of antibiotic continue on antibiotics and steroids. ?Despite use of inhalers, breathing continues to worsen.  No leg swelling no chest pain.  Patient has a history of anxiety and panic attacks. ?EMS reports patient had rhonchi, O2 sats in the 80s on arrival to the ED.   In the ED, patient was placed BiPAP. ?She was subsequently weaned to to [facility], but in the afternoon 4/19, she began having sob again with tachypnea for which she was placed back on BiPAP. ?ABG showed hypercarbic respiratory failure 7.09/105/232/31 on biPAP, so she was intubated.  PCCM was consulted to assist. ? ? ?Assessment and Plan: ? ?Acute respiratory failure with hypoxia and hypercapnia ?Tracheostomy/vent dependent respiratory failure ?Patient admitted initially with COPD exacerbation and managed on BiPAP. Worsening respiratory failure lead to [facility] consult and requirement for mechanical ventilation. Patient intubated on 4/19. Prolonged ventilator wean and so tracheostomy performed on 5/1. ?-Continue trach care per PCCM ?-Trach collar, 5 L/min ? ?Hospital acquired pneumonia ?Tracheal aspirate (4/27) significant for klebsiella pneumoniae infection. Patient completed 6 days of antibiotic therapy with Ceftriaxone/Cefepime. ? ?COPD exacerbation ?Initial problem which led to acute respiratory failure and mechanical ventilation. Patient treated with Steroids and nebulizer treatments. Exacerbation resolved. ?-Continue Brovana, Pulmicort scheduled and Xopenex PRN ? ?Acute  systolic heart failure ?LVEF of 40% on recent Transthoracic Echocardiogram with focal WMA. Cardiology consulted with recommendation for outpatient ischemic evaluation. ? ?Acute metabolic encephalopathy ?Resolved. Managed with Seroquel which is being decreased. ? ?Severe anxiety ?Continue Klonopin and Seroquel ? ?Thrombocytopenia ?Transient in setting of acute illness. Improved. ? ?Severe malnutrition ?Cortrak feeding tube placed on 4/28. Speech therapy on board. Discussed with patient and she is agreeable to PEG tube if she is unable to transition to oral intake. Patient advanced to a regular diet on 5/11. ?-Continue regular diet ?-Will start to wean tube feeds ? ?Hyperlipidemia ?-Continue Crestor ? ?Hyperglycemia ?Hemoglobin A1C of 5.4% ? ? ?DVT prophylaxis: Lovenox ?Code Status:   Code Status: Full Code ?Family Communication: None at bedside ?Disposition Plan: Discharge hopefully to [facility] when bed is available ? ? ?Consultants:  ?PCCM ?Cardiology ? ?Procedures:  ?Transthoracic Echocardiogram (09/27/21) ? ?Antimicrobials: ?Ceftriaxone ?Cefepime  ? ? ?Subjective: ?No issues noted overnight. ? ?Objective: ?BP (!) 145/66 (BP Location: Left Arm)   Pulse (!) 103   Temp 98.6 ?F (37 ?C) (Oral)   Resp 18   Ht 5\' 1"  (1.549 m)   Wt 53.6 kg   SpO2 98%   BMI 22.33 kg/m?  ? ?Examination: ? ?General exam: Appears calm and comfortable ?Respiratory system: Clear to auscultation. Respiratory effort normal. ?Cardiovascular system: S1 & S2 heard, RRR. No murmurs. ?Gastrointestinal system: Abdomen is nondistended, soft and nontender. Normal bowel sounds heard. ?Musculoskeletal: No edema. No calf tenderness ?Skin: No cyanosis. No rashes ? ? ?Data Reviewed: I have personally reviewed following labs and imaging studies ? ?CBC ?Lab Results  ?Component Value Date  ? WBC 8.0 10/16/2021  ? RBC 3.36 (L) 10/16/2021  ? HGB 10.9 (L) 10/16/2021  ? HCT 32.9 (L) 10/16/2021  ?  MCV 97.9 10/16/2021  ? MCH 32.4 10/16/2021  ? PLT 126 (L)  10/16/2021  ? MCHC 33.1 10/16/2021  ? RDW 14.2 10/16/2021  ? LYMPHSABS 0.8 10/16/2021  ? MONOABS 0.4 10/16/2021  ? EOSABS 0.2 10/16/2021  ? BASOSABS 0.0 10/16/2021  ? ? ? ?Last metabolic panel ?Lab Results  ?Component Value Date  ? NA 136 10/16/2021  ? K 4.0 10/19/2021  ? CL 99 10/16/2021  ? CO2 28 10/16/2021  ? BUN 16 10/16/2021  ? CREATININE 0.43 (L) 10/16/2021  ? GLUCOSE 121 (H) 10/16/2021  ? GFRNONAA >60 10/16/2021  ? GFRAA >60 05/14/2019  ? CALCIUM 8.5 (L) 10/16/2021  ? PHOS 3.1 10/08/2021  ? PROT 6.8 10/16/2021  ? ALBUMIN 2.1 (L) 10/16/2021  ? BILITOT 0.7 10/16/2021  ? ALKPHOS 114 10/16/2021  ? AST 39 10/16/2021  ? ALT 103 (H) 10/16/2021  ? ANIONGAP 9 10/16/2021  ? ? ?GFR: ?Estimated Creatinine Clearance: 54.3 mL/min (A) (by C-G formula based on SCr of 0.43 mg/dL (L)). ? ?No results found for this or any previous visit (from the past 240 hour(s)).  ? ? ?Radiology Studies: ?No results found. ? ? ? LOS: 24 days  ? ? ?Cordelia Poche, MD ?[facility] ?10/19/2021, 8:28 AM ? ? ?If 7PM-7AM, please contact night-coverage ?[email or web address] ? ?

## 2021-10-19 NOTE — Progress Notes (Signed)
Speech Language Pathology Treatment: Dysphagia;Passy Muir Speaking valve  ?Patient Details ?Name: [name] ?MRN: [id number] ?DOB: [birth year] ?Today's Date: 10/19/2021 ?Time: 4431-5400 ?SLP Time Calculation (min) (ACUTE ONLY): 14 min ? ?Assessment / Plan / Recommendation ?Clinical Impression ? Pt tolerating PMSV all waking hours, says its going great. SLP reinforced basic precautions. Pt still too weak in UE to place and remove herself. Pt reports no appetite yet, not really eating, but taking some sips. She had difficulty with labial seal on straw, improved when asked to wet her lips. Pt sipped from cup edge without difficulty. Progress is ongoing. SLP will f/u 1x a week next week to address needs.   ?HPI HPI: Pt is a 66 yo female presenting to [facility] with acute hypoxic/hypercarbic respiratory failure requiring intubation 4/19 followed by transfer to [facility] after new finding of LV dysfunction. Trach 5/1. PMH includes: COPD, previously on home O2 but "took herself off," former smoker (quit 2021), uncontrolled anxiety ?  ?   ?SLP Plan ? Continue with current plan of care ? ?  ?  ?Recommendations for follow up therapy are one component of a multi-disciplinary discharge planning process, led by the attending physician.  Recommendations may be updated based on patient status, additional functional criteria and insurance authorization. ?  ? ?Recommendations  ?Diet recommendations: Regular;Thin liquid ?Liquids provided via: Cup ?Medication Administration: Via alternative means ?Supervision: Staff to assist with self feeding  ?   ?    ?   ? ? ? ? Oral Care Recommendations: Oral care BID ?Follow Up Recommendations: Acute inpatient rehab (3hours/day) ?Assistance recommended at discharge: Frequent or constant Supervision/Assistance ?SLP Visit Diagnosis: Dysphagia, oropharyngeal phase (R13.12) ?Plan: Continue with current plan of care ? ? ? ? ?  ?  ? ? ?Akyra Bouchie, [name] ? ?10/19/2021, 10:58 AM ?

## 2021-10-19 NOTE — Progress Notes (Signed)
? ?Progress Note ? ?Patient Name: [name] ?Date of Encounter: 10/19/2021 ? ?[facility] Cardiologist: Dorris Carnes, MD  ? ?Subjective  ? ?Awake, alert, conversant. No new concerns.  ? ?Inpatient Medications  ?  ?Scheduled Meds: ? arformoterol  15 mcg Nebulization BID  ? aspirin  81 mg Per NG tube Daily  ? budesonide (PULMICORT) nebulizer solution  0.5 mg Nebulization BID  ? chlorhexidine gluconate (MEDLINE KIT)  15 mL Mouth Rinse BID  ? Chlorhexidine Gluconate Cloth  6 each Topical Daily  ? cholestyramine  4 g Per Tube TID  ? clonazePAM  0.5 mg Per Tube TID  ? enoxaparin (LOVENOX) injection  40 mg Subcutaneous Q24H  ? feeding supplement (PROSource TF)  45 mL Per Tube Daily  ? free water  200 mL Per Tube Q4H  ? furosemide  40 mg Per Tube Daily  ? Gerhardt's butt cream   Topical QID  ? insulin aspart  0-20 Units Subcutaneous Q4H  ? mouth rinse  15 mL Mouth Rinse 10 times per day  ? metoprolol succinate  12.5 mg Oral Daily  ? montelukast  10 mg Per Tube q AM  ? oxyCODONE  5 mg Per Tube Q8H  ? polyethylene glycol  17 g Per Tube Daily  ? QUEtiapine  25 mg Per Tube q morning  ? QUEtiapine  50 mg Per Tube QHS  ? revefenacin  175 mcg Nebulization Daily  ? rosuvastatin  20 mg Per NG tube Daily  ? senna  1 tablet Per Tube Daily  ? ?Continuous Infusions: ? sodium chloride Stopped (10/17/21 0748)  ? sodium chloride    ? feeding supplement (OSMOLITE 1.2 CAL) 1,000 mL (10/14/21 2030)  ? ?PRN Meds: ?sodium chloride, acetaminophen **OR** acetaminophen, docusate, guaiFENesin-dextromethorphan, levalbuterol, liver oil-zinc oxide, white petrolatum  ? ?Vital Signs  ?  ?Vitals:  ? 10/19/21 0420 10/19/21 0500 10/19/21 0745 10/19/21 0847  ?BP: (!) 145/66   (!) 115/58  ?Pulse: 71  (!) 103 (!) 105  ?Resp: _0 ?Temp: 98.6 ?F (37 ?C)   98.6 ?F (37 ?C)  ?TempSrc: Oral   Oral  ?SpO2:   98% 96%  ?Weight:  53.6 kg    ?Height:      ? ? ?Intake/Output Summary (Last 24 hours) at 10/19/2021 1054 ?Last data filed at 10/19/2021 0900 ?Gross  per 24 hour  ?Intake 1583.41 ml  ?Output 2280 ml  ?Net -696.59 ml  ? ? ?  10/19/2021  ?  5:00 AM 10/18/2021  ?  3:47 AM 10/17/2021  ? 11:42 PM  ?Last 3 Weights  ?Weight (lbs) 118 lb 2.7 oz 118 lb 2.7 oz 118 lb 2.7 oz  ?Weight (kg) 53.6 kg 53.6 kg 53.6 kg  ?   ? ?Telemetry  ?  ?NSR with brief NSVT around 12:30 AM, frequent PACs vs. Aberrantly conducted sinus beats- Personally Reviewed ? ?ECG  ?  ?No new since 4/17 - Personally Reviewed ? ?Physical Exam  ? ?GEN: Well nourished, well developed in no acute distress. On trach collar with speaking valve, Dobhoff tube in place ?NECK: No JVD appreciated but difficult to assess ?CARDIAC: regular rhythm, normal S1 and S2, no rubs or gallops. No murmur. ?VASCULAR: Radial pulses 2+ bilaterally.  ?RESPIRATORY:  Clear to auscultation without rales, wheezing or rhonchi  ?ABDOMEN: Soft, non-tender, non-distended ?MUSCULOSKELETAL:  Moves all 4 limbs independently ?SKIN: Warm and dry, no edema ?NEUROLOGIC:  No focal neuro deficits noted. ?PSYCHIATRIC:  Normal affect    ? ?  Labs  ?  ?High Sensitivity Troponin:   ?Recent Labs  ?Lab 09/24/21 ?1707 09/24/21 ?1931  ?TROPONINIHS 7 12  ?   ?Chemistry ?Recent Labs  ?Lab 10/13/21 ?2023 10/15/21 ?0151 10/16/21 ?0327 10/19/21 ?0144  ?NA 138 138 136  --   ?K 4.1 4.5 3.6 4.0  ?CL 99 105 99  --   ?CO2 32 29 28  --   ?GLUCOSE 106* 120* 121*  --   ?BUN 24* 20 16  --   ?CREATININE 0.37* 0.41* 0.43*  --   ?CALCIUM 8.2* 8.2* 8.5*  --   ?MG  --   --   --  2.1  ?PROT  --   --  6.8  --   ?ALBUMIN  --   --  2.1*  --   ?AST  --   --  39  --   ?ALT  --   --  103*  --   ?ALKPHOS  --   --  114  --   ?BILITOT  --   --  0.7  --   ?GFRNONAA >60 >60 >60  --   ?ANIONGAP 7 4* 9  --   ?  ?Lipids  ?Recent Labs  ?Lab 10/16/21 ?0327  ?CHOL 81  ?TRIG 61  ?HDL 46  ?[employer] 23  ?CHOLHDL 1.8  ?  ?Hematology ?Recent Labs  ?Lab 10/13/21 ?3435 10/15/21 ?0151 10/16/21 ?0327  ?WBC 12.7* 7.5 8.0  ?RBC 3.06* 3.10* 3.36*  ?HGB 10.1* 10.1* 10.9*  ?HCT 30.4* 31.3* 32.9*  ?MCV 99.3 101.0*  97.9  ?MCH 33.0 32.6 32.4  ?MCHC 33.2 32.3 33.1  ?RDW 14.1 14.4 14.2  ?PLT 99* 104* 126*  ? ?Thyroid No results for input(s): TSH, FREET4 in the last 168 hours.  ?BNP ?No results for input(s): BNP, PROBNP in the last 168 hours. ?  ?DDimer No results for input(s): DDIMER in the last 168 hours.  ? ?Radiology  ?  ?No results found. ? ?Cardiac Studies  ? ?Echo:  09/27/2021 ?  ?1. Limited visualization of the myocardium, incomplete assessment of wall  ?motion. Anteroseptal wall is akinetic. Apex is akinetic. Marland Kitchen Left  ?ventricular ejection fraction, by estimation, is 40%. The left ventricle  ?has mildly decreased function. The left  ?ventricle demonstrates regional wall motion abnormalities (see scoring  ?diagram/findings for description). Left ventricular diastolic parameters  ?are consistent with Grade I diastolic dysfunction (impaired relaxation).  ? 2. Right ventricular systolic function is normal. The right ventricular  ?size is normal. There is normal pulmonary artery systolic pressure.  ? 3. The mitral valve is normal in structure. No evidence of mitral valve  ?regurgitation. No evidence of mitral stenosis.  ? 4. The tricuspid valve is abnormal.  ? 5. The aortic valve is tricuspid. Aortic valve regurgitation is not  ?visualized. No aortic stenosis is present.  ? 6. The inferior vena cava is normal in size with greater than 50%  ?respiratory variability, suggesting right atrial pressure of 3 mmHg.  ? ?Patient Profile  ?   ?66 y.o. 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 Carles Collet. ? ?Assessment & Plan  ?  ?Cardiomyopathy ?Acute systolic and diastolic heart failure, now euvolemic ?Hypertension ?Hypercholesterolemia ?-EF 40% with focal wall motion abnormalities. Concern for ischemic etiology ?-losartan and metoprolol discontinued 5/7 for intermittent hypotension. Will add back low dose metoprolol today as BP  improving ?-would pursue ischemic evaluation  as an outpatient, as she will need to significantly recover from this prolonged hospitalization  ?-platelets have been borderline, tolerating aspirin, watch for bleeding or worsening thrombocytopenia ?-transaminitis improved (labs reviewed 5/9), continue rosuvastatin 20 mg daily. ?-low lipids this admission=Tchol 81, TG 61, HDL 46, LDL 23. Has been on tube feeds for some time ?-started on questran for loose stools, not lipids ? ?Acute hypoxic respiratory failure ?COPD exacerbation ?S/P tracheostomy ?-management per PCCM ? ?Tachycardia: ?-has been sinus rhythm/sinus tachycardia with rare pSVT ?-adding low dose metoprolol ? ?I expect she will have an extended recovery. Given this, cardiology will formally sign off for now, but please do not hesitate to contact us with any questions. Continue current cardiac medications as ordered.  ? ?We will arranged for outpatient [place] follow up (sees Dr. Harrington Challenger). ? ?For questions or updates, please contact [place] ?Please consult [email or web address] for contact info under  ?   ?Signed, ?Buford Dresser, MD  ?10/19/2021, 10:54 AM    ?

## 2021-10-20 DIAGNOSIS — J9601 Acute respiratory failure with hypoxia: Secondary | ICD-10-CM | POA: Diagnosis not present

## 2021-10-20 DIAGNOSIS — J441 Chronic obstructive pulmonary disease with (acute) exacerbation: Secondary | ICD-10-CM | POA: Diagnosis not present

## 2021-10-20 DIAGNOSIS — E43 Unspecified severe protein-calorie malnutrition: Secondary | ICD-10-CM | POA: Diagnosis not present

## 2021-10-20 DIAGNOSIS — G9340 Encephalopathy, unspecified: Secondary | ICD-10-CM | POA: Diagnosis not present

## 2021-10-20 LAB — GLUCOSE, CAPILLARY
Glucose-Capillary: 116 mg/dL — ABNORMAL HIGH (ref 70–99)
Glucose-Capillary: 124 mg/dL — ABNORMAL HIGH (ref 70–99)
Glucose-Capillary: 124 mg/dL — ABNORMAL HIGH (ref 70–99)
Glucose-Capillary: 150 mg/dL — ABNORMAL HIGH (ref 70–99)
Glucose-Capillary: 77 mg/dL (ref 70–99)
Glucose-Capillary: 97 mg/dL (ref 70–99)

## 2021-10-20 MED ORDER — ALBUTEROL SULFATE (2.5 MG/3ML) 0.083% IN NEBU
INHALATION_SOLUTION | RESPIRATORY_TRACT | Status: AC
  Administered 2021-10-20: 2.5 mg
  Filled 2021-10-20: qty 3

## 2021-10-20 NOTE — Progress Notes (Signed)
RT assessed pts trach this am, went to suction couldn't pass catheter, changed inner cannula and tried suctioning again, rt still couldn't pass catheter. Rt took guaze off and noticed trach was to the side and not I all the way. RT called anther RT to bring ETCO2 detector, it showed positive color change. RT called CCM for them to assess, MD pushed trach back in, positive color change noted and rt could pass suction catheter at this time. PT was never in distress. RT will continue to monitor.  ?

## 2021-10-20 NOTE — Progress Notes (Signed)
? ?PROGRESS NOTE ? ? ? ?[name]  [id number] DOB: Oct 14, 1955 DOA: 09/24/2021 ?PCP: Everardo Beals, NP ? ? ?Brief Narrative: ? 66 y.o. female with medical history significant for COPD, uncontrolled anxiety. ?Patient presented to the ED with complaints of difficulty breathing, cough, wheezing of about 1 week duration.  She also reports wheezing, and fatigue.  She reports O2 sats down to 78% on room air. ?She reports she was diagnosed with pneumonia yesterday, given a shot of antibiotic continue on antibiotics and steroids. ?Despite use of inhalers, breathing continues to worsen.  No leg swelling no chest pain.  Patient has a history of anxiety and panic attacks. ?EMS reports patient had rhonchi, O2 sats in the 80s on arrival to the ED.   In the ED, patient was placed BiPAP. ?She was subsequently weaned to to [facility], but in the afternoon 4/19, she began having sob again with tachypnea for which she was placed back on BiPAP. ?ABG showed hypercarbic respiratory failure 7.09/105/232/31 on biPAP, so she was intubated.  PCCM was consulted to assist. ? ? ?Assessment and Plan: ? ?Acute respiratory failure with hypoxia and hypercapnia ?Tracheostomy/vent dependent respiratory failure ?Patient admitted initially with COPD exacerbation and managed on BiPAP. Worsening respiratory failure lead to [facility] consult and requirement for mechanical ventilation. Patient intubated on 4/19. Prolonged ventilator wean and so tracheostomy performed on 5/1. ?-Continue trach care per PCCM ?-Trach collar, 5 L/min ? ?Hospital acquired pneumonia ?Tracheal aspirate (4/27) significant for klebsiella pneumoniae infection. Patient completed 6 days of antibiotic therapy with Ceftriaxone/Cefepime. ? ?COPD exacerbation ?Initial problem which led to acute respiratory failure and mechanical ventilation. Patient treated with Steroids and nebulizer treatments. Exacerbation resolved. ?-Continue Brovana, Pulmicort scheduled and Xopenex PRN ? ?Acute  systolic heart failure ?LVEF of 40% on recent Transthoracic Echocardiogram with focal WMA. Cardiology consulted with recommendation for outpatient ischemic evaluation. ? ?Acute metabolic encephalopathy ?Resolved. Managed with Seroquel which is being decreased. ? ?Severe anxiety ?Continue Klonopin and Seroquel ? ?Thrombocytopenia ?Transient in setting of acute illness. Improved. ? ?Severe malnutrition ?Cortrak feeding tube placed on 4/28. Speech therapy on board. Discussed with patient and she is agreeable to PEG tube if she is unable to transition to oral intake. Patient advanced to a regular diet on 5/11. Tube feeds transitioned to overnight. ?-Continue regular diet ?-Attempts to wean tube feeds ? ?Hyperlipidemia ?-Continue Crestor ? ?Hyperglycemia ?Hemoglobin A1C of 5.4% ? ? ?DVT prophylaxis: Lovenox ?Code Status:   Code Status: Full Code ?Family Communication: None at bedside ?Disposition Plan: Discharge hopefully to [facility] when bed is available ? ? ?Consultants:  ?PCCM ?Cardiology ? ?Procedures:  ?Transthoracic Echocardiogram (09/27/21) ? ?Antimicrobials: ?Ceftriaxone ?Cefepime  ? ? ?Subjective: ?Some issue with her tracheostomy this morning. Tracheostomy was repositioned by pulmonary/critical care.  ? ?Objective: ?BP 139/62   Pulse (!) 113   Temp 98.1 ?F (36.7 ?C) (Oral)   Resp 16   Ht 5\' 1"  (1.549 m)   Wt 51 kg   SpO2 96%   BMI 21.24 kg/m?  ? ?Examination: ? ?General exam: Appears calm and comfortable ?Respiratory system: Respiratory effort normal. ?Cardiovascular system: S1 & S2 heard, RRR. ?Gastrointestinal system: Abdomen is nondistended, soft and nontender. Normal bowel sounds heard. ?Central nervous system: Alert and oriented. ?Musculoskeletal: No edema. No calf tenderness ?Skin: No cyanosis. No rashes ?Psychiatry: Judgement and insight appear normal. Mood & affect appropriate.  ? ? ?Data Reviewed: I have personally reviewed following labs and imaging studies ? ?CBC ?Lab Results  ?Component Value  Date  ? WBC  8.0 10/16/2021  ? RBC 3.36 (L) 10/16/2021  ? HGB 10.9 (L) 10/16/2021  ? HCT 32.9 (L) 10/16/2021  ? MCV 97.9 10/16/2021  ? MCH 32.4 10/16/2021  ? PLT 126 (L) 10/16/2021  ? MCHC 33.1 10/16/2021  ? RDW 14.2 10/16/2021  ? LYMPHSABS 0.8 10/16/2021  ? MONOABS 0.4 10/16/2021  ? EOSABS 0.2 10/16/2021  ? BASOSABS 0.0 10/16/2021  ? ? ? ?Last metabolic panel ?Lab Results  ?Component Value Date  ? NA 136 10/16/2021  ? K 4.0 10/19/2021  ? CL 99 10/16/2021  ? CO2 28 10/16/2021  ? BUN 16 10/16/2021  ? CREATININE 0.43 (L) 10/16/2021  ? GLUCOSE 121 (H) 10/16/2021  ? GFRNONAA >60 10/16/2021  ? GFRAA >60 05/14/2019  ? CALCIUM 8.5 (L) 10/16/2021  ? PHOS 3.1 10/08/2021  ? PROT 6.8 10/16/2021  ? ALBUMIN 2.1 (L) 10/16/2021  ? BILITOT 0.7 10/16/2021  ? ALKPHOS 114 10/16/2021  ? AST 39 10/16/2021  ? ALT 103 (H) 10/16/2021  ? ANIONGAP 9 10/16/2021  ? ? ?GFR: ?Estimated Creatinine Clearance: 54.3 mL/min (A) (by C-G formula based on SCr of 0.43 mg/dL (L)). ? ?No results found for this or any previous visit (from the past 240 hour(s)).  ? ? ?Radiology Studies: ?No results found. ? ? ? LOS: 25 days  ? ? ?Cordelia Poche, MD ?[facility] ?10/20/2021, 1:33 PM ? ? ?If 7PM-7AM, please contact night-coverage [email or web address] ? ?

## 2021-10-21 ENCOUNTER — Inpatient Hospital Stay (HOSPITAL_COMMUNITY): Payer: Medicare Other

## 2021-10-21 DIAGNOSIS — E43 Unspecified severe protein-calorie malnutrition: Secondary | ICD-10-CM | POA: Diagnosis not present

## 2021-10-21 DIAGNOSIS — J441 Chronic obstructive pulmonary disease with (acute) exacerbation: Secondary | ICD-10-CM | POA: Diagnosis not present

## 2021-10-21 DIAGNOSIS — J9601 Acute respiratory failure with hypoxia: Secondary | ICD-10-CM | POA: Diagnosis not present

## 2021-10-21 DIAGNOSIS — G9340 Encephalopathy, unspecified: Secondary | ICD-10-CM | POA: Diagnosis not present

## 2021-10-21 LAB — GLUCOSE, CAPILLARY
Glucose-Capillary: 108 mg/dL — ABNORMAL HIGH (ref 70–99)
Glucose-Capillary: 114 mg/dL — ABNORMAL HIGH (ref 70–99)
Glucose-Capillary: 117 mg/dL — ABNORMAL HIGH (ref 70–99)
Glucose-Capillary: 139 mg/dL — ABNORMAL HIGH (ref 70–99)
Glucose-Capillary: 148 mg/dL — ABNORMAL HIGH (ref 70–99)
Glucose-Capillary: 156 mg/dL — ABNORMAL HIGH (ref 70–99)
Glucose-Capillary: 99 mg/dL (ref 70–99)

## 2021-10-21 MED ORDER — BISACODYL 10 MG RE SUPP
10.0000 mg | Freq: Once | RECTAL | Status: AC
  Administered 2021-10-21: 10 mg via RECTAL
  Filled 2021-10-21: qty 1

## 2021-10-21 NOTE — Progress Notes (Signed)
? ?PROGRESS NOTE ? ? ? ?[name]  [id number] DOB: [birth year] DOA: 09/24/2021 ?PCP: Everardo Beals, NP ? ? ?Brief Narrative: ? 66 y.o. female with medical history significant for COPD, uncontrolled anxiety. ?Patient presented to the ED with complaints of difficulty breathing, cough, wheezing of about 1 week duration.  She also reports wheezing, and fatigue.  She reports O2 sats down to 78% on room air. ?She reports she was diagnosed with pneumonia yesterday, given a shot of antibiotic continue on antibiotics and steroids. ?Despite use of inhalers, breathing continues to worsen.  No leg swelling no chest pain.  Patient has a history of anxiety and panic attacks. ?EMS reports patient had rhonchi, O2 sats in the 80s on arrival to the ED.   In the ED, patient was placed BiPAP. ?She was subsequently weaned to to [facility], but in the afternoon 4/19, she began having sob again with tachypnea for which she was placed back on BiPAP. ?ABG showed hypercarbic respiratory failure 7.09/105/232/31 on biPAP, so she was intubated.  PCCM was consulted to assist. ? ? ?Assessment and Plan: ? ?Acute respiratory failure with hypoxia and hypercapnia ?Tracheostomy/vent dependent respiratory failure ?Patient admitted initially with COPD exacerbation and managed on BiPAP. Worsening respiratory failure lead to [facility] consult and requirement for mechanical ventilation. Patient intubated on 4/19. Prolonged ventilator wean and so tracheostomy performed on 5/1. ?-Continue trach care per PCCM ?-Trach collar, 8 L/min ? ?Hospital acquired pneumonia ?Tracheal aspirate (4/27) significant for klebsiella pneumoniae infection. Patient completed 6 days of antibiotic therapy with Ceftriaxone/Cefepime. ? ?COPD exacerbation ?Initial problem which led to acute respiratory failure and mechanical ventilation. Patient treated with Steroids and nebulizer treatments. Exacerbation resolved. ?-Continue Brovana, Pulmicort scheduled and Xopenex PRN ? ?Acute  systolic heart failure ?LVEF of 40% on recent Transthoracic Echocardiogram with focal WMA. Cardiology consulted with recommendation for outpatient ischemic evaluation. ? ?Acute metabolic encephalopathy ?Resolved. Managed with Seroquel which is being decreased. ? ?Severe anxiety ?Continue Klonopin and Seroquel ? ?Thrombocytopenia ?Transient in setting of acute illness. Improved. ? ?Severe malnutrition ?Cortrak feeding tube placed on 4/28. Speech therapy on board. Discussed with patient and she is agreeable to PEG tube if she is unable to transition to oral intake. Patient advanced to a regular diet on 5/11. Tube feeds transitioned to overnight. ?-Continue regular diet ?-Attempts to wean tube feeds ? ?Hyperlipidemia ?-Continue Crestor ? ?Hyperglycemia ?Hemoglobin A1C of 5.4% ? ? ?DVT prophylaxis: Lovenox ?Code Status:   Code Status: Full Code ?Family Communication: None at bedside ?Disposition Plan: Discharge hopefully to [facility] when bed is available ? ? ?Consultants:  ?PCCM ?Cardiology ? ?Procedures:  ?Transthoracic Echocardiogram (09/27/21) ? ?Antimicrobials: ?Ceftriaxone ?Cefepime  ? ? ?Subjective: ?Patient had some nausea overnight. Feels better this morning. ? ?Objective: ?BP 121/73   Pulse (!) 103   Temp 97.6 ?F (36.4 ?C) (Oral)   Resp 15   Ht 5\' 1"  (1.549 m)   Wt 53.6 kg   SpO2 92%   BMI 22.33 kg/m?  ? ?Examination: ? ?General exam: Appears calm and comfortable ?Respiratory system: Clear to auscultation. Respiratory effort normal. ?Cardiovascular system: S1 & S2 heard, RRR. ?Gastrointestinal system: Abdomen is nondistended, soft and nontender. Normal bowel sounds heard. ?Central nervous system: Alert and oriented. No focal neurological deficits. ?Musculoskeletal: No edema. No calf tenderness ?Psychiatry: Judgement and insight appear normal. Mood & affect appropriate.  ? ? ?Data Reviewed: I have personally reviewed following labs and imaging studies ? ?CBC ?Lab Results  ?Component Value Date  ? WBC 8.0  10/16/2021  ?  RBC 3.36 (L) 10/16/2021  ? HGB 10.9 (L) 10/16/2021  ? HCT 32.9 (L) 10/16/2021  ? MCV 97.9 10/16/2021  ? MCH 32.4 10/16/2021  ? PLT 126 (L) 10/16/2021  ? MCHC 33.1 10/16/2021  ? RDW 14.2 10/16/2021  ? LYMPHSABS 0.8 10/16/2021  ? MONOABS 0.4 10/16/2021  ? EOSABS 0.2 10/16/2021  ? BASOSABS 0.0 10/16/2021  ? ? ? ?Last metabolic panel ?Lab Results  ?Component Value Date  ? NA 136 10/16/2021  ? K 4.0 10/19/2021  ? CL 99 10/16/2021  ? CO2 28 10/16/2021  ? BUN 16 10/16/2021  ? CREATININE 0.43 (L) 10/16/2021  ? GLUCOSE 121 (H) 10/16/2021  ? GFRNONAA >60 10/16/2021  ? GFRAA >60 05/14/2019  ? CALCIUM 8.5 (L) 10/16/2021  ? PHOS 3.1 10/08/2021  ? PROT 6.8 10/16/2021  ? ALBUMIN 2.1 (L) 10/16/2021  ? BILITOT 0.7 10/16/2021  ? ALKPHOS 114 10/16/2021  ? AST 39 10/16/2021  ? ALT 103 (H) 10/16/2021  ? ANIONGAP 9 10/16/2021  ? ? ?GFR: ?Estimated Creatinine Clearance: 54.3 mL/min (A) (by C-G formula based on SCr of 0.43 mg/dL (L)). ? ?No results found for this or any previous visit (from the past 240 hour(s)).  ? ? ?Radiology Studies: ?No results found. ? ? ? LOS: 26 days  ? ? ?Cordelia Poche, MD ?[facility] ?10/21/2021, 11:39 AM ? ? ?If 7PM-7AM, please contact night-coverage ?[email or web address] ? ?

## 2021-10-22 DIAGNOSIS — G9340 Encephalopathy, unspecified: Secondary | ICD-10-CM | POA: Diagnosis not present

## 2021-10-22 DIAGNOSIS — J9601 Acute respiratory failure with hypoxia: Secondary | ICD-10-CM | POA: Diagnosis not present

## 2021-10-22 DIAGNOSIS — E43 Unspecified severe protein-calorie malnutrition: Secondary | ICD-10-CM | POA: Diagnosis not present

## 2021-10-22 DIAGNOSIS — J9602 Acute respiratory failure with hypercapnia: Secondary | ICD-10-CM | POA: Diagnosis not present

## 2021-10-22 DIAGNOSIS — J441 Chronic obstructive pulmonary disease with (acute) exacerbation: Secondary | ICD-10-CM | POA: Diagnosis not present

## 2021-10-22 LAB — GLUCOSE, CAPILLARY
Glucose-Capillary: 119 mg/dL — ABNORMAL HIGH (ref 70–99)
Glucose-Capillary: 118 mg/dL — ABNORMAL HIGH (ref 70–99)
Glucose-Capillary: 119 mg/dL — ABNORMAL HIGH (ref 70–99)
Glucose-Capillary: 116 mg/dL — ABNORMAL HIGH (ref 70–99)
Glucose-Capillary: 128 mg/dL — ABNORMAL HIGH (ref 70–99)

## 2021-10-22 MED ORDER — OSMOLITE 1.5 CAL PO LIQD
1000.0000 mL | ORAL | Status: DC
  Administered 2021-10-22 – 2021-10-25 (×4): 1000 mL
  Filled 2021-10-22 (×5): qty 1000

## 2021-10-22 MED ORDER — ONDANSETRON HCL 4 MG/2ML IJ SOLN
4.0000 mg | Freq: Four times a day (QID) | INTRAMUSCULAR | Status: DC | PRN
  Administered 2021-10-22 – 2021-11-14 (×24): 4 mg via INTRAVENOUS
  Filled 2021-10-22 (×26): qty 2

## 2021-10-22 NOTE — Procedures (Signed)
Tracheostomy Change Note ? ?Patient Details:   ?Name: [name] ?DOB: December 21, 1955 ?MRN: [id number] ?   ?Airway Documentation: ?   ? ?Evaluation ? O2 sats: stable throughout ?Complications: No apparent complications ?Patient did tolerate procedure well. ?Bilateral Breath Sounds: Clear ? ?Patient's trach changed from #6 Shiley cuffed trach to a #6 Shiley cuffless trach.  Positive color change noted.  Patient tolerated well.  No complications noted.  ?  ? ?Elyn Peers ?10/22/2021, 4:19 PM ?

## 2021-10-22 NOTE — Progress Notes (Signed)
Nutrition Follow-up ? ?DOCUMENTATION CODES:  ? ?Severe malnutrition in context of chronic illness ? ?INTERVENTION:  ?Tube Feeding via Cortrak: ?Provide Nocturnal tube feeds of Osmolite 1.5 at new goal of 80 ml/hr x 12 hours ?Continue Pro-Source TF 45 mL BID  ?This provides 82 g of protein, 1520 kcals, 730 mL of free waer ?  ?Decrease free water flushes for now to 100 mL q 4 hours as pt able to take water by mouth ?  ?Ensure Enlive po BID, each supplement provides 350 kcal and 20 grams of protein. ? ?Discontinue Calorie count. ? ?NUTRITION DIAGNOSIS:  ? ?Severe Malnutrition related to chronic illness (COPD) as evidenced by severe muscle depletion, severe fat depletion; ongoing ? ?GOAL:  ? ?Patient will meet greater than or equal to 90% of their needs; met with TF ? ?MONITOR:  ? ?PO intake, Labs, Weight trends, Skin, I & O's, TF tolerance ? ?REASON FOR ASSESSMENT:  ? ?Ventilator ?  ? ?ASSESSMENT:  ? ?Pt admitted with difficulty breathing d/t acute respiratory failure with hypoxia and hypercapnia. Pt with diagnosis of PNA 4/16. PMH significant for COPD, uncontrolled anxiety. ? ?4/18 Admitted ?4/28 Cortrak placed ?5/01 Trach ?5/11 Diet advanced to Regular ?5/12 Change to nocturnal TF via Cortrak ? ?Pt continues on trach collar. Pt with nausea at time of visit and did not eat lunch tray. RN aware of pt nausea. Per RN, pt po intake has been poor with 0% at most meals. RD to increase nocturnal tube feeding rate to provide 95-100% of nutrition needs. Will continue to offer Ensure to encourage PO intake.  ? ?Labs and medications reviewed.  ? ?Diet Order:   ?Diet Order   ? ?       ?  Diet regular Room service appropriate? Yes; Fluid consistency: Thin  Diet effective now       ?  ? ?  ?  ? ?  ? ? ?EDUCATION NEEDS:  ? ?No education needs have been identified at this time ? ?Skin:  Skin Assessment: Reviewed RN Assessment ? ?Last BM:  5/11 ? ?Height:  ? ?Ht Readings from Last 1 Encounters:  ?09/25/21 5' 1" (1.549 m)  ? ? ?Weight:   ? ?Wt Readings from Last 1 Encounters:  ?10/22/21 57.2 kg  ? ?BMI:  Body mass index is 23.83 kg/m?. ? ?Estimated Nutritional Needs:  ? ?Kcal:  1600-1800 ? ?Protein:  75-90g ? ?Fluid:  >/=1.5L ? ? , MS, RD, LDN ?RD pager number/after hours weekend pager number on Amion. ? ?

## 2021-10-22 NOTE — Progress Notes (Signed)
Speech Language Pathology Treatment: Dysphagia;Passy Muir Speaking valve  ?Patient Details ?Name: [name] ?MRN: [id number] ?DOB: [birth year] ?Today's Date: 10/22/2021 ?Time: [id number] ?SLP Time Calculation (min) (ACUTE ONLY): 15 min ? ?Assessment / Plan / Recommendation ?Clinical Impression ? Pt's trach changed to cuffless with RT prior to session and wearing valve on ST arrival. Therapist doffed then donned checking for back pressure and no pressure was detected when doffed. Discussed goal of her manipulating valve for her to remove and place and initially needed encouragement to even see the valve. She appears too weak still to manage manipulating trach collar to donn/doff. Verbalizations were intelligible and clear with good intensity and adequate support in conversation with HR 105, SpO2 94% and stable respirations. Wear valve during all waking hours, remove during sleep.  ?Pt has been nauseated today and not eaten but wanted sips of water which she self fed with one immediate cough on initial sip only. Stated they are waiting on order for nausea meds. Will continue to encourage po's as she is able.  ?  ?HPI HPI: Pt is a 66 yo female presenting to [facility] with acute hypoxic/hypercarbic respiratory failure requiring intubation 4/19 followed by transfer to [facility] after new finding of LV dysfunction. Trach 5/1. PMH includes: COPD, previously on home O2 but "took herself off," former smoker (quit 2021), uncontrolled anxiety ?  ?   ?SLP Plan ? Continue with current plan of care ? ?  ?  ?Recommendations for follow up therapy are one component of a multi-disciplinary discharge planning process, led by the attending physician.  Recommendations may be updated based on patient status, additional functional criteria and insurance authorization. ?  ? ?Recommendations  ?Diet recommendations: Regular;Thin liquid ?Liquids provided via: Cup;Straw ?Medication Administration: Whole meds with puree ?Supervision: Staff to assist with  self feeding ?Compensations: Slow rate;Small sips/bites ?Postural Changes and/or Swallow Maneuvers: Seated upright 90 degrees  ?   ? Patient may use Passy-Muir Speech Valve: During all waking hours (remove during sleep) ?PMSV Supervision: Intermittent  ?   ? ? ? ? Oral Care Recommendations: Oral care BID ?Follow Up Recommendations: Skilled nursing-short term rehab (<3 hours/day) ?SLP Visit Diagnosis: Dysphagia, unspecified (R13.10);Aphonia (R49.1) ?Plan: Continue with current plan of care ? ? ? ? ?  ?  ? ? ?Houston Siren ? ?10/22/2021, 3:19 PM ?

## 2021-10-22 NOTE — Progress Notes (Signed)
PT Cancellation Note ? ?Patient Details ?Name: [name] ?MRN: [id number] ?DOB: [birth year] ? ? ?Cancelled Treatment:    Reason Eval/Treat Not Completed: Medical issues which prohibited therapy (Just had trach change and didnt want to do therapy this pm.) ? ? ?Kaoru Rezendes F Orel Hord ?10/22/2021, 3:36 PM ?Deklan Minar M,PT ?Acute Rehab Services ?[phone] ?[phone] (pager)  ?

## 2021-10-22 NOTE — Progress Notes (Signed)
SLP Cancellation Note ? ?Patient Details ?Name: [name] ?MRN: [id number] ?DOB: [birth year] ? ? ?Cancelled treatment:        Attempted to see for PMV and dysphagia therapy. RT present getting ready to change trach to cuffless. ST will follow.  ? ? ?Houston Siren ?10/22/2021, 2:31 PM ?

## 2021-10-22 NOTE — Progress Notes (Signed)
? ?  NAME:  [name], MRN:  [id number], DOB:  1956-02-05, LOS: 27 ?ADMISSION DATE:  09/24/2021, CONSULTATION DATE:  4/18 ?REFERRING MD:  Tat/ [facility]ad, CHIEF COMPLAINT:  resp distress   ? ?History of Present Illness:  ?66 y.o. female quit smoking 2021 with  GOLD 3 COPD MZ, uncontrolled anxiety. Was on 02 one year PTA but "took herself off" per friend at bedside and able to care for pets at home but mostly housebound sinc ethen ? ?She was admitted 4/17 to [facility], ICU with acute hypoxic/hypercarbic respiratory failure, failed BiPAP and required mechanical ventilation, transferred to [facility] 4/21 due to new finding of LV dysfunction. ? ?Significant Hospital Events: ?Including procedures, antibiotic start and stop dates in addition to other pertinent events   ?ET  4/18 c/b hypotension with severe air trapping  ?Echo 4/20 with EF 40% , akinesis of apex and septal wall ?4/24 CTA head negative for any significant abnormality ?4/25 ketamine added ?4/28 increased work of breathing during the wean, severe auto PEEP ?4/29 Febrile 101.2 ?5/1 perc trach placement ? ?Interim History / Subjective:  ?No events. ?Still intermittent panic attacks. ?Some secretions ongoing. ? ?Objective   ?Blood pressure (!) 114/55, pulse (!) 101, temperature 98.1 ?F (36.7 ?C), temperature source Oral, resp. rate (!) 22, height 5\' 1"  (1.549 m), weight 57.2 kg, SpO2 94 %. ?   ?FiO2 (%):  [35 %] 35 %  ? ?Intake/Output Summary (Last 24 hours) at 10/22/2021 0848 ?Last data filed at 10/21/2021 1700 ?Gross per 24 hour  ?Intake 300 ml  ?Output 700 ml  ?Net -400 ml  ? ? ?Filed Weights  ? 10/20/21 0425 10/21/21 0500 10/22/21 0500  ?Weight: 51 kg 53.6 kg 57.2 kg  ? ?Examination: ?Thin anxious woman ?6-0 cuffed in place CDI ?Ext with muscle wasting ?Lungs severely diminished ? ?Assessment & Plan:  ? ?#Acute hypoxemic/hypercapnic respiratory failure ?#Tracheostomy with vent dependent respiratory failure ?#HAP Klebsiella pneumonia - resolved ?#Resolving acute  exacerbation of COPD - resolved ?#Newly diagnosed heart failure w/ mildly reduced EF ?#Acute metabolic encephalopathy - resolved ?#Severe anxiety ?#Thrombocytopenia ?#Severe protein-calorie malnutrition ?# Dysphagia ? ?From our standpoint, continue post trach bundle, nebs, change to 6-0 cuffless, push PT/OT/SLP ? ?Will see again in 1 week to consider downsize trial to 4-0 and potential capping ? ?10/24/21 MD PCCM ? ? ? ?

## 2021-10-23 ENCOUNTER — Encounter (HOSPITAL_COMMUNITY): Payer: Medicare Other

## 2021-10-23 DIAGNOSIS — E43 Unspecified severe protein-calorie malnutrition: Secondary | ICD-10-CM | POA: Diagnosis not present

## 2021-10-23 DIAGNOSIS — J9601 Acute respiratory failure with hypoxia: Secondary | ICD-10-CM | POA: Diagnosis not present

## 2021-10-23 DIAGNOSIS — J441 Chronic obstructive pulmonary disease with (acute) exacerbation: Secondary | ICD-10-CM | POA: Diagnosis not present

## 2021-10-23 DIAGNOSIS — G9340 Encephalopathy, unspecified: Secondary | ICD-10-CM | POA: Diagnosis not present

## 2021-10-23 LAB — GLUCOSE, CAPILLARY
Glucose-Capillary: 102 mg/dL — ABNORMAL HIGH (ref 70–99)
Glucose-Capillary: 107 mg/dL — ABNORMAL HIGH (ref 70–99)
Glucose-Capillary: 108 mg/dL — ABNORMAL HIGH (ref 70–99)
Glucose-Capillary: 148 mg/dL — ABNORMAL HIGH (ref 70–99)
Glucose-Capillary: 151 mg/dL — ABNORMAL HIGH (ref 70–99)
Glucose-Capillary: 153 mg/dL — ABNORMAL HIGH (ref 70–99)
Glucose-Capillary: 92 mg/dL (ref 70–99)

## 2021-10-23 NOTE — Progress Notes (Signed)
? ?PROGRESS NOTE ? ? ? ?[name]  [id number] DOB: [birth year] DOA: 09/24/2021 ?PCP: Everardo Beals, NP ? ?Patient seen on 5/15 but note is submitted late. ? ?Brief Narrative: ? 66 y.o. female with medical history significant for COPD, uncontrolled anxiety. ?Patient presented to the ED with complaints of difficulty breathing, cough, wheezing of about 1 week duration.  She also reports wheezing, and fatigue.  She reports O2 sats down to 78% on room air. ?She reports she was diagnosed with pneumonia yesterday, given a shot of antibiotic continue on antibiotics and steroids. ?Despite use of inhalers, breathing continues to worsen.  No leg swelling no chest pain.  Patient has a history of anxiety and panic attacks. ?EMS reports patient had rhonchi, O2 sats in the 80s on arrival to the ED.   In the ED, patient was placed BiPAP. ?She was subsequently weaned to to [facility], but in the afternoon 4/19, she began having sob again with tachypnea for which she was placed back on BiPAP. ?ABG showed hypercarbic respiratory failure 7.09/105/232/31 on biPAP, so she was intubated.  PCCM was consulted to assist. ? ? ?Assessment and Plan: ? ?Acute respiratory failure with hypoxia and hypercapnia ?Tracheostomy/vent dependent respiratory failure ?Patient admitted initially with COPD exacerbation and managed on BiPAP. Worsening respiratory failure lead to [facility] consult and requirement for mechanical ventilation. Patient intubated on 4/19. Prolonged ventilator wean and so tracheostomy performed on 5/1. ?-Continue trach care per PCCM ?-Trach collar, 8 L/min ? ?Hospital acquired pneumonia ?Tracheal aspirate (4/27) significant for klebsiella pneumoniae infection. Patient completed 6 days of antibiotic therapy with Ceftriaxone/Cefepime. ? ?COPD exacerbation ?Initial problem which led to acute respiratory failure and mechanical ventilation. Patient treated with Steroids and nebulizer treatments. Exacerbation resolved. ?-Continue Brovana,  Pulmicort scheduled and Xopenex PRN ? ?Acute systolic heart failure ?LVEF of 40% on recent Transthoracic Echocardiogram with focal WMA. Cardiology consulted with recommendation for outpatient ischemic evaluation. ? ?Acute metabolic encephalopathy ?Resolved. Managed with Seroquel which is being decreased. ? ?Severe anxiety ?Continue Klonopin and Seroquel ? ?Thrombocytopenia ?Transient in setting of acute illness. Improved. ? ?Severe malnutrition ?Cortrak feeding tube placed on 4/28. Speech therapy on board. Discussed with patient and she is agreeable to PEG tube if she is unable to transition to oral intake. Patient advanced to a regular diet on 5/11. Tube feeds transitioned to overnight. ?-Continue regular diet ?-Attempts to wean tube feeds ? ?Hyperlipidemia ?-Continue Crestor ? ?Hyperglycemia ?Hemoglobin A1C of 5.4% ? ? ?DVT prophylaxis: Lovenox ?Code Status:   Code Status: Full Code ?Family Communication: None at bedside ?Disposition Plan: Discharge hopefully to [facility] when bed is available ? ? ?Consultants:  ?PCCM ?Cardiology ? ?Procedures:  ?Transthoracic Echocardiogram (09/27/21) ? ?Antimicrobials: ?Ceftriaxone ?Cefepime  ? ? ?Subjective: ?Patient reports having some nausea last night. Going to try and eat more today. ? ?Objective: ? ? ?Examination: ? ?General exam: Appears calm and comfortable ?Respiratory system: Clear to auscultation. Respiratory effort normal. ?Gastrointestinal system: Abdomen is nondistended, soft and nontender. Normal bowel sounds heard. ?Central nervous system: Alert and oriented. ?Psychiatry: Judgement and insight appear normal. Mood & affect appropriate.   ? ? ?Data Reviewed: I have personally reviewed following labs and imaging studies ? ?CBC ?Lab Results  ?Component Value Date  ? WBC 8.0 10/16/2021  ? RBC 3.36 (L) 10/16/2021  ? HGB 10.9 (L) 10/16/2021  ? HCT 32.9 (L) 10/16/2021  ? MCV 97.9 10/16/2021  ? MCH 32.4 10/16/2021  ? PLT 126 (L) 10/16/2021  ? MCHC 33.1 10/16/2021  ? RDW 14.2  10/16/2021  ? LYMPHSABS 0.8 10/16/2021  ? MONOABS 0.4 10/16/2021  ? EOSABS 0.2 10/16/2021  ? BASOSABS 0.0 10/16/2021  ? ? ? ?Last metabolic panel ?Lab Results  ?Component Value Date  ? NA 136 10/16/2021  ? K 4.0 10/19/2021  ? CL 99 10/16/2021  ? CO2 28 10/16/2021  ? BUN 16 10/16/2021  ? CREATININE 0.43 (L) 10/16/2021  ? GLUCOSE 121 (H) 10/16/2021  ? GFRNONAA >60 10/16/2021  ? GFRAA >60 05/14/2019  ? CALCIUM 8.5 (L) 10/16/2021  ? PHOS 3.1 10/08/2021  ? PROT 6.8 10/16/2021  ? ALBUMIN 2.1 (L) 10/16/2021  ? BILITOT 0.7 10/16/2021  ? ALKPHOS 114 10/16/2021  ? AST 39 10/16/2021  ? ALT 103 (H) 10/16/2021  ? ANIONGAP 9 10/16/2021  ? ? ?GFR: ?Estimated Creatinine Clearance: 54.3 mL/min (A) (by C-G formula based on SCr of 0.43 mg/dL (L)). ? ?No results found for this or any previous visit (from the past 240 hour(s)).  ? ? ?Radiology Studies: ?DG Abd Portable 1V ? ?Result Date: 10/21/2021 ?CLINICAL DATA:  Abdominal distension, enteric catheter EXAM: PORTABLE ABDOMEN - 1 VIEW COMPARISON:  09/27/2021 FINDINGS: Frontal view of the lower chest and upper abdomen demonstrates enteric catheter passing below diaphragm tip overlying the gastric antrum. Bowel gas pattern is grossly unremarkable. Chronic scarring throughout the lungs. IMPRESSION: 1. Enteric catheter tip projecting over the gastric antrum. Electronically Signed   By: Randa Ngo M.D.   On: 10/21/2021 17:48   ? ? ? LOS: 28 days  ? ? ?Cordelia Poche, MD ?[facility] ?10/23/2021, 2:54 PM ? ? ?If 7PM-7AM, please contact night-coverage [email or web address] ? ?

## 2021-10-23 NOTE — Progress Notes (Signed)
Physical Therapy Treatment ?Patient Details ?Name: [name] ?MRN: [id number] ?DOB: [birth year] ?Today's Date: 10/23/2021 ? ? ?History of Present Illness 66 y/o F reports diagnosis of PNA on 4/16 Admitted 4/17 to [facility], ICU with acute hypoxic/hypercarbic respiratory failure, failed BiPAP and required mechanical ventilation, transferred to [facility] 4/21 due to new finding of LV dysfunction Echo 4/20 with EF 40% , akinesis of apex and septal wall. . Failed weaning attempt 4/28. Trach placed on 5/1. PMH significant for COPD, tobacco use, HLD, GRED, and uncontrolled anxiety. ? ?  ?PT Comments  ? ? Continuing work on functional mobility and activity tolerance;  Session focused on initial use of Maximove lift for safe OOB to chair transfers, and overall pt tolerated the transfer well; anxious during session, but engaged with caregivers; She does not like the Prevalon boots, but my concern is fro her ankle dorsiflexion ROM; have messaged Dr. Lonny Prude for an order for PRAFOs with wlaking soles   ?Recommendations for follow up therapy are one component of a multi-disciplinary discharge planning process, led by the attending physician.  Recommendations may be updated based on patient status, additional functional criteria and insurance authorization. ? ?Follow Up Recommendations ? PT at Long-term acute care hospital ?  ?  ?Assistance Recommended at Discharge Frequent or constant Supervision/Assistance  ?Patient can return home with the following Two people to help with walking and/or transfers;Two people to help with bathing/dressing/bathroom;Assist for transportation;Help with stairs or ramp for entrance ?  ?Equipment Recommendations ? BSC/3in1;Wheelchair (measurements PT);Wheelchair cushion (measurements PT);Other (comment);Hospital bed (hoyer lift)  ?  ?Recommendations for Other Services   ? ? ?  ?Precautions / Restrictions Precautions ?Precautions: Fall ?Precaution Comments: trach, cortrak ?Restrictions ?Weight Bearing  Restrictions: No  ?  ? ?Mobility ? Bed Mobility ?Overal bed mobility: Needs Assistance ?Bed Mobility: Rolling ?Rolling: Max assist ?  ?  ?  ?  ?General bed mobility comments: Rolled R and L for Maximove pad placement ?  ? ?Transfers ?Overall transfer level: Needs assistance ?Equipment used: Ambulation equipment used ?Transfers: Bed to chair/wheelchair/BSC ?  ?  ?  ?  ?  ?  ?General transfer comment: Engaged in session, asking questions about Maximove, and following directions, especially related to hand placement ?Transfer via Lift Equipment: Young ? ?Ambulation/Gait ?  ?  ?  ?  ?  ?  ?  ?  ? ? ?Stairs ?  ?  ?  ?  ?  ? ? ?Wheelchair Mobility ?  ? ?Modified Rankin (Stroke Patients Only) ?  ? ? ?  ?Balance   ?  ?  ?  ?  ?  ?  ?  ?  ?  ?  ?  ?  ?  ?  ?  ?  ?  ?  ?  ? ?  ?Cognition Arousal/Alertness: Awake/alert ?Behavior During Therapy: [facility] for tasks assessed/performed, Anxious ?Overall Cognitive Status: Difficult to assess ?  ?  ?  ?  ?  ?  ?  ?  ?  ?  ?  ?  ?  ?  ?  ?Problem Solving: Slow processing, Requires verbal cues, Requires tactile cues ?General Comments: Anxious during session with th e novel experience of using the [facility] for OOB to chair ?  ?  ? ?  ?Exercises   ? ?  ?General Comments General comments (skin integrity, edema, etc.): VSS on trach collar; O2 sats with supplemental O2 94% in chair, HR 110bpm; pt requesting breathing treatment, nursing called RT ?  ?  ? ?  Pertinent Vitals/Pain Pain Assessment ?Pain Assessment: Faces ?Faces Pain Scale: Hurts a little bit ?Pain Location: generalized ?Pain Descriptors / Indicators: Grimacing ?Pain Intervention(s): Monitored during session  ? ? ?Home Living   ?  ?  ?  ?  ?  ?  ?  ?  ?  ?   ?  ?Prior Function    ?  ?  ?   ? ?PT Goals (current goals can now be found in the care plan section) Acute Rehab PT Goals ?Patient Stated Goal: Did not stae, but agrees to getting OOB ?PT Goal Formulation: With patient ?Time For Goal Achievement: 10/23/21 ?Potential to Achieve  Goals: Fair ?Progress towards PT goals: Progressing toward goals (very slowly) ? ?  ?Frequency ? ? ? Min 3X/week ? ? ? ?  ?PT Plan Current plan remains appropriate  ? ? ?Co-evaluation   ?  ?  ?  ?  ? ?  ?AM-PAC PT "6 Clicks" Mobility   ?Outcome Measure ? Help needed turning from your back to your side while in a flat bed without using bedrails?: A Lot ?Help needed moving from lying on your back to sitting on the side of a flat bed without using bedrails?: Total ?Help needed moving to and from a bed to a chair (including a wheelchair)?: Total ?Help needed standing up from a chair using your arms (e.g., wheelchair or bedside chair)?: Total ?Help needed to walk in hospital room?: Total ?Help needed climbing 3-5 steps with a railing? : Total ?6 Click Score: 7 ? ?  ?End of Session Equipment Utilized During Treatment: Oxygen (Maximove) ?Activity Tolerance: Patient tolerated treatment well ?Patient left: in chair;with call bell/phone within reach;with chair alarm set ?Nurse Communication: Mobility status;Need for lift equipment (REquesting breathing treatment) ?PT Visit Diagnosis: Other abnormalities of gait and mobility (R26.89);Muscle weakness (generalized) (M62.81);Difficulty in walking, not elsewhere classified (R26.2) ?Pain - part of body:  (generalized) ?  ? ? ?Time: [id number] ?PT Time Calculation (min) (ACUTE ONLY): 50 min ? ?Charges:  $Therapeutic Activity: 38-52 mins          ?          ? ?Roney Marion, PT  ?Acute Rehabilitation Services ?Office [phone] ? ? ? ?Colletta [place] ?10/23/2021, 2:24 PM ? ?

## 2021-10-23 NOTE — TOC Progression Note (Signed)
Transition of Care (TOC) - Initial/Assessment Note  ? ? ?Patient Details  ?Name: [name] ?MRN: [id number] ?Date of Birth: 09-12-1955 ? ?Transition of Care (TOC) CM/SW Contact:    ?Catalina Pizza Ladislav Caselli, LCSWA ?Phone Number: ?10/23/2021, 8:57 AM ? ?Clinical Narrative:                 ?CSW messaged [facility] to inquire about their ability to accept the patient since there is now a potential for the patient's trach to be capped.  CSW was informed that the patient  must be up out of bed with transfers to be considered.   ? ?TOC will continue to follow. ? ?Expected Discharge Plan: Long Term Acute Care (LTAC) ?Barriers to Discharge: Continued Medical Work up ? ? ?Patient Goals and CMS Choice ?  ?  ?Choice offered to / list presented to : Spouse ? ?Expected Discharge Plan and Services ?Expected Discharge Plan: Long Term Acute Care (LTAC) ?In-house Referral: Clinical Social Work ?  ?Post Acute Care Choice: Long Term Acute Care (LTAC) ?Living arrangements for the past 2 months: Single Family Home ?                ?  ?  ?  ?  ?  ?  ?  ?  ?  ?  ? ?Prior Living Arrangements/Services ?Living arrangements for the past 2 months: Single Family Home ?Lives with:: Spouse ?Patient language and need for interpreter reviewed:: No ?       ?Need for Family Participation in Patient Care: Yes (Comment) ?Care giver support system in place?: Yes (comment) ?Current home services: Other (comment) (none) ?Criminal Activity/Legal Involvement Pertinent to Current Situation/Hospitalization: No - Comment as needed ? ?Activities of Daily Living ?Home Assistive Devices/Equipment: None ?ADL Screening (condition at time of admission) ?Patient's cognitive ability adequate to safely complete daily activities?: Yes ?Is the patient deaf or have difficulty hearing?: No ?Does the patient have difficulty seeing, even when wearing glasses/contacts?: No ?Does the patient have difficulty concentrating, remembering, or making decisions?: No ?Patient able to express need  for assistance with ADLs?: Yes ?Does the patient have difficulty dressing or bathing?: No ?Independently performs ADLs?: Yes (appropriate for developmental age) ?Does the patient have difficulty walking or climbing stairs?: Yes ?Weakness of Legs: Both ?Weakness of Arms/Hands: None ? ?Permission Sought/Granted ?  ?  ?   ?   ?   ?   ? ?Emotional Assessment ?Appearance:: Appears stated age ?Attitude/Demeanor/Rapport: Unable to Assess ?Affect (typically observed): Unable to Assess ?Orientation: :  (intubated) ?Alcohol / Substance Use: Not Applicable ?Psych Involvement: No (comment) ? ?Admission diagnosis:  Acute respiratory distress [R06.03] ?COPD exacerbation (HCC) [J44.1] ?COPD with acute exacerbation (HCC) [J44.1] ?Acute exacerbation of COPD with asthma (HCC) [J44.1, J45.901] ?Patient Active Problem List  ? Diagnosis Date Noted  ? Acute encephalopathy   ? Protein-calorie malnutrition, severe 10/03/2021  ? Heart failure with mid-range ejection fraction (HFmEF) (HCC) 09/28/2021  ? Hypernatremia 09/27/2021  ? Acute respiratory failure with hypoxia and hypercapnia (HCC) 09/26/2021  ? Lactic acidosis 09/26/2021  ? GERD (gastroesophageal reflux disease) 09/25/2021  ? Hyperglycemia 09/25/2021  ? Anxiety 09/24/2021  ? Physical deconditioning 02/06/2021  ? Stridor 12/28/2020  ? Shortness of breath 12/19/2020  ? Hypokalemia 05/01/2019  ? SBO (small bowel obstruction) (HCC) 04/28/2019  ? SIRS (systemic inflammatory response syndrome) (HCC) 04/28/2019  ? Tobacco abuse 04/28/2019  ? Chronic pain 04/28/2019  ? AKI (acute kidney injury) (HCC) 04/28/2019  ? Acute respiratory failure (HCC) 08/17/2013  ?  COPD exacerbation (HCC) 08/16/2013  ? ?PCP:  Marva Panda, NP ?Pharmacy:   ?[id number] [place], [place] - [address] ?[address] ?[place] [address] ?Phone: [phone] Fax: [phone] ? ?[facility] [phone] - [place], [place] - [address] ?[address] ?[place] [address] ?Phone: [phone] Fax: [phone] ? ?[id number] - [place],  - [address] ?[address] ?[place]  [address] ?Phone: [phone] Fax: [phone] ? ?[facility] - [place], [place] - [address] ?[address] ?[address] ?[place] [address] ?Phone: [phone] Fax: [phone] ? ? ? ? ?Social Determinants of Health (SDOH) Interventions ?  ? ?Readmission Risk Interventions ?   ? View : No data to display.  ?  ?  ?  ? ? ? ?

## 2021-10-23 NOTE — Progress Notes (Signed)
? ?PROGRESS NOTE ? ? ? ?[name]  [id number] DOB: [birth year] DOA: 09/24/2021 ?PCP: Everardo Beals, NP ? ? ?Brief Narrative: ? 66 y.o. female with medical history significant for COPD, uncontrolled anxiety. ?Patient presented to the ED with complaints of difficulty breathing, cough, wheezing of about 1 week duration.  She also reports wheezing, and fatigue.  She reports O2 sats down to 78% on room air. ?She reports she was diagnosed with pneumonia yesterday, given a shot of antibiotic continue on antibiotics and steroids. ?Despite use of inhalers, breathing continues to worsen.  No leg swelling no chest pain.  Patient has a history of anxiety and panic attacks. ?EMS reports patient had rhonchi, O2 sats in the 80s on arrival to the ED.   In the ED, patient was placed BiPAP. ?She was subsequently weaned to to [facility], but in the afternoon 4/19, she began having sob again with tachypnea for which she was placed back on BiPAP. ?ABG showed hypercarbic respiratory failure 7.09/105/232/31 on biPAP, so she was intubated.  PCCM was consulted to assist. ? ? ?Assessment and Plan: ? ?Acute respiratory failure with hypoxia and hypercapnia ?Tracheostomy/vent dependent respiratory failure ?Patient admitted initially with COPD exacerbation and managed on BiPAP. Worsening respiratory failure lead to [facility] consult and requirement for mechanical ventilation. Patient intubated on 4/19. Prolonged ventilator wean and so tracheostomy performed on 5/1. ?-Continue trach care per PCCM ?-Trach collar, 8 L/min ? ?Hospital acquired pneumonia ?Tracheal aspirate (4/27) significant for klebsiella pneumoniae infection. Patient completed 6 days of antibiotic therapy with Ceftriaxone/Cefepime. ? ?COPD exacerbation ?Initial problem which led to acute respiratory failure and mechanical ventilation. Patient treated with Steroids and nebulizer treatments. Exacerbation resolved. ?-Continue Brovana, Pulmicort scheduled and Xopenex PRN ? ?Acute  systolic heart failure ?LVEF of 40% on recent Transthoracic Echocardiogram with focal WMA. Cardiology consulted with recommendation for outpatient ischemic evaluation. ? ?Acute metabolic encephalopathy ?Resolved. Managed with Seroquel which is being decreased. ? ?Severe anxiety ?Continue Klonopin and Seroquel ? ?Thrombocytopenia ?Transient in setting of acute illness. Improved. ? ?Severe malnutrition ?Cortrak feeding tube placed on 4/28. Speech therapy on board. Discussed with patient and she is agreeable to PEG tube if she is unable to transition to oral intake. Patient advanced to a regular diet on 5/11. Tube feeds transitioned to overnight. ?-Continue regular diet ?-Attempts to wean tube feeds ? ?Hyperlipidemia ?-Continue Crestor ? ?Hyperglycemia ?Hemoglobin A1C of 5.4% ? ? ?DVT prophylaxis: Lovenox ?Code Status:   Code Status: Full Code ?Family Communication: None at bedside ?Disposition Plan: Discharge hopefully to [facility] when bed is available ? ? ?Consultants:  ?PCCM ?Cardiology ? ?Procedures:  ?Transthoracic Echocardiogram (09/27/21) ? ?Antimicrobials: ?Ceftriaxone ?Cefepime  ? ? ?Subjective: ?Patient reports not liking the taste of food she gets and gives that as the reason for not eating. She did not receive a breakfast tray. We spoke about her need to take more by mouth to avoid needing a PEG. ? ?Objective: ?BP 116/68 (BP Location: Right Arm)   Pulse (!) 102   Temp 98.6 ?F (37 ?C)   Resp 20   Ht 5\' 1"  (1.549 m)   Wt 57.2 kg   SpO2 92%   BMI 23.83 kg/m?  ? ?Examination: ? ?General exam: Appears calm and comfortable  ?Respiratory system: Mild rhonchi. Respiratory effort normal. ?Cardiovascular system: S1 & S2 heard, RRR. No murmurs, rubs, gallops or clicks. ?Gastrointestinal system: Abdomen is nondistended, soft and nontender. Normal bowel sounds heard. ?Central nervous system: Alert and oriented.  ?Musculoskeletal: No edema. No calf tenderness. Foot  drop bilaterally ?Skin: No cyanosis. No  rashes ?Psychiatry: Judgement and insight appear normal. Mood & affect appropriate.  ? ? ?Data Reviewed: I have personally reviewed following labs and imaging studies ? ?CBC ?Lab Results  ?Component Value Date  ? WBC 8.0 10/16/2021  ? RBC 3.36 (L) 10/16/2021  ? HGB 10.9 (L) 10/16/2021  ? HCT 32.9 (L) 10/16/2021  ? MCV 97.9 10/16/2021  ? MCH 32.4 10/16/2021  ? PLT 126 (L) 10/16/2021  ? MCHC 33.1 10/16/2021  ? RDW 14.2 10/16/2021  ? LYMPHSABS 0.8 10/16/2021  ? MONOABS 0.4 10/16/2021  ? EOSABS 0.2 10/16/2021  ? BASOSABS 0.0 10/16/2021  ? ? ? ?Last metabolic panel ?Lab Results  ?Component Value Date  ? NA 136 10/16/2021  ? K 4.0 10/19/2021  ? CL 99 10/16/2021  ? CO2 28 10/16/2021  ? BUN 16 10/16/2021  ? CREATININE 0.43 (L) 10/16/2021  ? GLUCOSE 121 (H) 10/16/2021  ? GFRNONAA >60 10/16/2021  ? GFRAA >60 05/14/2019  ? CALCIUM 8.5 (L) 10/16/2021  ? PHOS 3.1 10/08/2021  ? PROT 6.8 10/16/2021  ? ALBUMIN 2.1 (L) 10/16/2021  ? BILITOT 0.7 10/16/2021  ? ALKPHOS 114 10/16/2021  ? AST 39 10/16/2021  ? ALT 103 (H) 10/16/2021  ? ANIONGAP 9 10/16/2021  ? ? ?GFR: ?Estimated Creatinine Clearance: 54.3 mL/min (A) (by C-G formula based on SCr of 0.43 mg/dL (L)). ? ?No results found for this or any previous visit (from the past 240 hour(s)).  ? ? ?Radiology Studies: ?DG Abd Portable 1V ? ?Result Date: 10/21/2021 ?CLINICAL DATA:  Abdominal distension, enteric catheter EXAM: PORTABLE ABDOMEN - 1 VIEW COMPARISON:  09/27/2021 FINDINGS: Frontal view of the lower chest and upper abdomen demonstrates enteric catheter passing below diaphragm tip overlying the gastric antrum. Bowel gas pattern is grossly unremarkable. Chronic scarring throughout the lungs. IMPRESSION: 1. Enteric catheter tip projecting over the gastric antrum. Electronically Signed   By: Randa Ngo M.D.   On: 10/21/2021 17:48   ? ? ? LOS: 28 days  ? ? ?Cordelia Poche, MD ?[facility] ?10/23/2021, 2:55 PM ? ? ?If 7PM-7AM, please contact night-coverage ?[email or web address] ? ?

## 2021-10-23 NOTE — Progress Notes (Signed)
Mobility Specialist: Progress Note ? ? 10/23/21 1250  ?Mobility  ?Activity Transferred from chair to bed  ?Level of Assistance +2 (takes two people)  ?Assistive Device MaxiMove  ?Activity Response Tolerated fair  ?$Mobility charge 1 Mobility  ? ?Pt assisted back to bed per request. Some anxiety during transfer, otherwise no c/o. Pt back in bed with NT present in the room.  ? ?[name] ?Mobility Specialist ?Mobility Specialist [place]: [phone] ?Mobility Specialist [place]: [phone] ? ?

## 2021-10-23 NOTE — Progress Notes (Signed)
Ortho tech made aware of ankle orthosis, PRAFOS with walking soles as ordered by Ortho MD. ?

## 2021-10-23 NOTE — Progress Notes (Signed)
Orthopedic Tech Progress Note ?Patient Details:  ?[name] ?Sep 28, 1955 ?[id number] ? ?Called in order to [employer] for a pair of WALKING PRAFO BOOTS  ? ?Patient ID: [name], female   DOB: 07/03/1955, 66 y.o.   MRN: [id number] ? ?Janit Pagan ?10/23/2021, 2:56 PM ? ?

## 2021-10-24 ENCOUNTER — Inpatient Hospital Stay (HOSPITAL_COMMUNITY): Payer: Medicare Other

## 2021-10-24 DIAGNOSIS — J9601 Acute respiratory failure with hypoxia: Secondary | ICD-10-CM | POA: Diagnosis not present

## 2021-10-24 DIAGNOSIS — J9602 Acute respiratory failure with hypercapnia: Secondary | ICD-10-CM | POA: Diagnosis not present

## 2021-10-24 LAB — COMPREHENSIVE METABOLIC PANEL
ALT: 156 U/L — ABNORMAL HIGH (ref 0–44)
AST: 71 U/L — ABNORMAL HIGH (ref 15–41)
Albumin: 2.2 g/dL — ABNORMAL LOW (ref 3.5–5.0)
Alkaline Phosphatase: 272 U/L — ABNORMAL HIGH (ref 38–126)
Anion gap: 9 (ref 5–15)
BUN: 24 mg/dL — ABNORMAL HIGH (ref 8–23)
CO2: 35 mmol/L — ABNORMAL HIGH (ref 22–32)
Calcium: 8.7 mg/dL — ABNORMAL LOW (ref 8.9–10.3)
Chloride: 92 mmol/L — ABNORMAL LOW (ref 98–111)
Creatinine, Ser: 0.37 mg/dL — ABNORMAL LOW (ref 0.44–1.00)
GFR, Estimated: >60 mL/min (ref >60)
Glucose, Bld: 165 mg/dL — ABNORMAL HIGH (ref 70–99)
Potassium: 3.8 mmol/L (ref 3.5–5.1)
Sodium: 136 mmol/L (ref 135–145)
Total Bilirubin: 0.5 mg/dL (ref 0.3–1.2)
Total Protein: 6.9 g/dL (ref 6.5–8.1)

## 2021-10-24 LAB — CBC WITH DIFFERENTIAL/PLATELET
Abs Immature Granulocytes: 0.46 10*3/uL — ABNORMAL HIGH (ref 0.00–0.07)
Basophils Absolute: 0.1 10*3/uL (ref 0.0–0.1)
Basophils Relative: 0 %
Eosinophils Absolute: 0 10*3/uL (ref 0.0–0.5)
Eosinophils Relative: 0 %
HCT: 28 % — ABNORMAL LOW (ref 36.0–46.0)
Hemoglobin: 8.9 g/dL — ABNORMAL LOW (ref 12.0–15.0)
Immature Granulocytes: 3 %
Lymphocytes Relative: 11 %
Lymphs Abs: 1.6 10*3/uL (ref 0.7–4.0)
MCH: 31.9 pg (ref 26.0–34.0)
MCHC: 31.8 g/dL (ref 30.0–36.0)
MCV: 100.4 fL — ABNORMAL HIGH (ref 80.0–100.0)
Monocytes Absolute: 1.1 10*3/uL — ABNORMAL HIGH (ref 0.1–1.0)
Monocytes Relative: 7 %
Neutro Abs: 11.8 10*3/uL — ABNORMAL HIGH (ref 1.7–7.7)
Neutrophils Relative %: 79 %
Platelets: 310 10*3/uL (ref 150–400)
RBC: 2.79 MIL/uL — ABNORMAL LOW (ref 3.87–5.11)
RDW: 14.5 % (ref 11.5–15.5)
WBC: 15 10*3/uL — ABNORMAL HIGH (ref 4.0–10.5)
nRBC: 0 % (ref 0.0–0.2)

## 2021-10-24 LAB — GLUCOSE, CAPILLARY
Glucose-Capillary: 163 mg/dL — ABNORMAL HIGH (ref 70–99)
Glucose-Capillary: 106 mg/dL — ABNORMAL HIGH (ref 70–99)
Glucose-Capillary: 123 mg/dL — ABNORMAL HIGH (ref 70–99)
Glucose-Capillary: 95 mg/dL (ref 70–99)
Glucose-Capillary: 147 mg/dL — ABNORMAL HIGH (ref 70–99)
Glucose-Capillary: 113 mg/dL — ABNORMAL HIGH (ref 70–99)

## 2021-10-24 LAB — PROTIME-INR
INR: 1 (ref 0.8–1.2)
Prothrombin Time: 13.1 seconds (ref 11.4–15.2)

## 2021-10-24 LAB — LACTIC ACID, PLASMA
Lactic Acid, Venous: 1.9 mmol/L (ref 0.5–1.9)
Lactic Acid, Venous: 2.3 mmol/L (ref 0.5–1.9)
Lactic Acid, Venous: 1.6 mmol/L (ref 0.5–1.9)

## 2021-10-24 LAB — APTT: aPTT: 21 seconds — ABNORMAL LOW (ref 24–36)

## 2021-10-24 LAB — MRSA NEXT GEN BY PCR, NASAL: MRSA by PCR Next Gen: NOT DETECTED

## 2021-10-24 LAB — PROCALCITONIN: Procalcitonin: 0.1 ng/mL

## 2021-10-24 MED ORDER — SODIUM CHLORIDE 0.9 % IV BOLUS (SEPSIS)
500.0000 mL | Freq: Once | INTRAVENOUS | Status: AC
  Administered 2021-10-24: 500 mL via INTRAVENOUS

## 2021-10-24 MED ORDER — SODIUM CHLORIDE 0.9 % IV BOLUS
500.0000 mL | Freq: Once | INTRAVENOUS | Status: AC
  Administered 2021-10-24: 500 mL via INTRAVENOUS

## 2021-10-24 MED ORDER — VANCOMYCIN HCL 1250 MG/250ML IV SOLN
1250.0000 mg | Freq: Once | INTRAVENOUS | Status: AC
  Administered 2021-10-24: 1250 mg via INTRAVENOUS
  Filled 2021-10-24: qty 250

## 2021-10-24 MED ORDER — VANCOMYCIN HCL IN DEXTROSE 1-5 GM/200ML-% IV SOLN
1000.0000 mg | INTRAVENOUS | Status: DC
Start: 2021-10-25 — End: 2021-10-25
  Administered 2021-10-25: 1000 mg via INTRAVENOUS
  Filled 2021-10-24: qty 200

## 2021-10-24 MED ORDER — SODIUM CHLORIDE 0.9 % IV SOLN
2.0000 g | Freq: Two times a day (BID) | INTRAVENOUS | Status: DC
Start: 2021-10-24 — End: 2021-10-29
  Administered 2021-10-24 – 2021-10-29 (×10): 2 g via INTRAVENOUS
  Filled 2021-10-24 (×10): qty 12.5

## 2021-10-24 MED ORDER — LORAZEPAM 2 MG/ML IJ SOLN
1.0000 mg | Freq: Once | INTRAMUSCULAR | Status: AC
  Administered 2021-10-24: 1 mg via INTRAVENOUS
  Filled 2021-10-24: qty 1

## 2021-10-24 MED ORDER — SODIUM CHLORIDE 0.9 % IV SOLN
2.0000 g | Freq: Once | INTRAVENOUS | Status: AC
  Administered 2021-10-24: 2 g via INTRAVENOUS
  Filled 2021-10-24: qty 12.5

## 2021-10-24 MED ORDER — LORAZEPAM 2 MG/ML IJ SOLN
0.5000 mg | Freq: Once | INTRAMUSCULAR | Status: AC
  Administered 2021-10-24: 0.5 mg via INTRAVENOUS
  Filled 2021-10-24: qty 1

## 2021-10-24 NOTE — Progress Notes (Signed)
Pharmacy Antibiotic Note ? ?[name] is a 66 y.o. female admitted on 09/24/2021, now with concern for new pneumonia.  Pharmacy has been consulted for vancomycin and cefepime dosing. ? ?Plan: ?Vancomycin 1250mg  x1 then 1000mg  IV Q24H. Goal AUC 400-550.  Expected AUC 490.  SCr used 0.8 (actual <0.5).  ?Cefepime 2g IV Q12H. ? ?Height: 5\' 1"  (154.9 cm) ?Weight: 57.2 kg (126 lb 1.7 oz) ?IBW/kg (Calculated) : 47.8 ? ?Temp (24hrs), Avg:98.2 ?F (36.8 ?C), Min:97.4 ?F (36.3 ?C), Max:99.2 ?F (37.3 ?C) ? ?Estimated Creatinine Clearance: 54.3 mL/min (A) (by C-G formula based on SCr of 0.43 mg/dL (L)).  ? ?No Known Allergies ? ? ?Thank you for allowing pharmacy to be a part of this patient?s care. ? ? , PharmD, BCPS  ?10/24/2021 4:08 AM ? ?

## 2021-10-24 NOTE — Sepsis Progress Note (Signed)
Monitoring for the code sepsis protocol. °

## 2021-10-24 NOTE — Sepsis Progress Note (Signed)
Notified bedside nurse of need to draw lactic acid and blood cultures.  

## 2021-10-24 NOTE — Progress Notes (Addendum)
Overnight progress note ? ?Patient initially admitted for COPD exacerbation which led to acute respiratory failure and mechanical ventilation. Prolonged ventilator wean and tracheostomy performed on 5/1.  Requiring 8 L O2 via trach.  She also finished treatment for pneumonia.  Echo done 09/27/2021 showing EF 40%. ? ?Notified by RN that patient has been on 10 L O2 via trach since yesterday and currently satting 90-91%.  Respiratory rate 28-30, heart rate in the 120s, temperature 97.4 ?F.  Reported wet lung sounds. ? ?-RT called to give Xopenex treatment ?-Continue scheduled bronchodilator treatments ?-Stat chest x-ray ordered ? ? ? ?Addendum/update 10/24/2021 at 3:27 AM: Chest x-ray showing interval new patchy hazy opacity in the right mid lung periphery, lesser new opacity in the peripheral left midlung.  Findings concerning for multilobar pneumonia. ? ?Tracheal aspirate 4/27 significant for Klebsiella infection. ? ?-Start vancomycin and cefepime.  MRSA PCR screen ?-500 cc bolus ordered given tachycardia and concern for sepsis.  Monitor volume status closely given history of CHF. ?-Blood cultures ?-Stat labs including CBC, CMP, lactate, PT/INR, procalcitonin ?-Strep pneumo/Legionella antigens ?-Continue supplemental oxygen and monitor very closely ? ?Addendum 10/24/2021 at 3:46 AM: Blood pressure currently stable, most recent 130/88. ?

## 2021-10-24 NOTE — Progress Notes (Signed)
?   10/24/21 [id number]  ?Assess: MEWS Score  ?BP 130/88  ?Pulse Rate (!) 124  ?Resp (!) 30  ?Level of Consciousness Alert  ?SpO2 94 %  ?O2 Device Tracheostomy Collar  ?O2 Flow Rate (L/min) 10 L/min  ?FiO2 (%) 40 %  ?Assess: MEWS Score  ?MEWS Temp 0  ?MEWS Systolic 0  ?MEWS Pulse 2  ?MEWS RR 2  ?MEWS LOC 0  ?MEWS Score 4  ?MEWS Score Color Red  ?Assess: if the MEWS score is Yellow or Red  ?Were vital signs taken at a resting state? Yes  ?Focused Assessment Change from prior assessment (see assessment flowsheet)  ?Early Detection of Sepsis Score *See Row Information* Medium  ?MEWS guidelines implemented *See Row Information* Yes  ?Treat  ?MEWS Interventions Administered prn meds/treatments  ?Pain Scale 0-10  ?Pain Score 0  ?Complains of Anxiety;Shortness of breath  ?Take Vital Signs  ?Increase Vital Sign Frequency  Red: Q 1hr X 4 then Q 4hr X 4, if remains red, continue Q 4hrs  ?Escalate  ?MEWS: Escalate Red: discuss with charge nurse/RN and provider, consider discussing with RRT  ?Notify: Charge Nurse/RN  ?Name of Charge Nurse/RN Notified [place], RN  ?Date Charge Nurse/RN Notified 10/24/21  ?Time Charge Nurse/RN Notified [phone]  ?Notify: Provider  ?Provider Name/Title Marlowe Sax, MD  ?Date Provider Notified 10/24/21  ?Time Provider Notified 0230  ?Method of Notification Page  ?Notification Reason Change in status  ?Provider response See new orders  ?Notify: Rapid Response  ?Name of Rapid Response RN Notified RT notified ?(Resp treatment given, suction)  ?Date Rapid Response Notified 10/24/21  ?Time Rapid Response Notified 0247  ?Document  ?Patient Outcome Not stable and remains on department  ?Progress note created (see row info) Yes  ? ? ?

## 2021-10-24 NOTE — Progress Notes (Signed)
?PROGRESS NOTE ? ?[name]  ?DOB: 11-Mar-1956  ?PCP: Everardo Beals, NP ?XKG:[id number]  ?DOA: 09/24/2021 ? LOS: 29 days  ?Hospital Day: 31 ? ?Brief narrative: ?[name] is a 66 y.o. female with PMH significant for GOLD 3 COPD, quit smoking 2021, uncontrolled anxiety. ?Patient presented to ED on 4/17 with complaint of difficulty breathing, cough, wheezing, fatigue with symptoms worsening for 1 week.  EMS noted hypoxia. ?Patient required BiPAP in the ED and was admitted to hospitalist service.  However her respiratory status continued to worsen. ?On 4/19 she was intubated and transferred to ICU. ?Work-up in ICU showed a low EF of 40% (4/20).  She was not able to be weaned off ventilator. ?5/1, patient underwent tracheostomy. ?5/15, transferred out to [facility] ? ? ?Subjective: ?Patient was seen and examined this morning.  Thin built middle-aged Caucasian female.  Looks older for her age.  Alert, awake, oriented x3.  Physically weak in her both lower extremities.  Can move upper extremities. ?Currently on trach collar with a valve, requiring 10 L oxygen.  Core track feeding ongoing ?She has episodes of anxiety that worsens respite status.  Events from last night noted.  She was tachycardic, tachypneic, lactic acid level was elevated.  She was presumed to be in sepsis again and hence blood cultures were sent and antibiotics were started. ? ?Principal Problem: ?  Acute respiratory failure with hypoxia and hypercapnia (HCC) ?Active Problems: ?  COPD exacerbation ([place]) ?  Hypokalemia ?  Anxiety ?  GERD (gastroesophageal reflux disease) ?  Hyperglycemia ?  Lactic acidosis ?  Hypernatremia ?  Heart failure with mid-range ejection fraction (HFmEF) ([place]) ?  Protein-calorie malnutrition, severe ?  Acute encephalopathy ?  ? ?Assessment and Plan: ?Acute respiratory failure with hypoxia and hypercapnia ?Tracheostomy/vent dependent respiratory failure ?Severe COPD ?-Patient was admitted initially with COPD exacerbation.   Respiratory status worsened and required intubation which got prolonged and subsequently required tracheostomy on 5/1.   ?-Continue trach care per PCCM ?-Currently on trach collar, 8-10 L/min.  Continue wean down as tolerated. ?-Continue Brovana, Pulmicort scheduled and Xopenex PRN ?  ?Hospital acquired pneumonia ?Sepsis-5/17 ?-Tracheal aspirate (4/27) significant for klebsiella pneumoniae infection. Patient completed 6 days of antibiotic therapy with Ceftriaxone/Cefepime. ?-However, 5/16, patient was in respiratory distress again.  WBC count and lactic acid level were elevated.  Patient was started on IV antibiotics for presumed sepsis.  Blood cultures sent. ?-Continue maintenance IV fluid. ?Recent Labs  ?Lab 10/24/21 ?0425 10/24/21 ?7026 10/24/21 ?0837  ?WBC 15.0*  --   --   ?LATICACIDVEN 1.9 2.3* 1.6  ?PROCALCITON 0.10  --   --   ? ?Acute systolic heart failure ?-3/78, echocardiogram showed LVEF of 40% with focal WMA.  ?-Cardiology recommended outpatient ischemic evaluation. ?  ?Acute metabolic encephalopathy ?-Resolved.  Currently on tapering course of Seroquel ? ?Severe uncontrolled anxiety ?-Continue Klonopin and Seroquel ?-Required a dose of IV Ativan today. ?  ?Severe malnutrition ?Cortrak feeding tube placed on 4/28. Speech therapy on board. Discussed with patient and she is agreeable to PEG tube if she is unable to transition to oral intake. Patient advanced to a regular diet on 5/11. Tube feeds transitioned to overnight. ?-Continue regular diet ?-Attempts to wean tube feeds.  Speech therapy following ?  ?Hyperlipidemia ?-Continue Crestor ?  ?Hyperglycemia ?Hemoglobin A1C of 5.4% ?Recent Labs  ?Lab 10/23/21 ?1638 10/23/21 ?2015 10/23/21 ?2357 10/24/21 ?0345 10/24/21 ?5885  ?GLUCAP 92 151* 153* 163* 106*  ? ?Goals of care ?  Code  Status: Full Code  ? ? ?Mobility: PT following.  LTAC was recommended.  Insurance denied.  Per case management, CIR to review once tracheostomy decannulated. ? ?Skin assessment:  ?   ? ?Nutritional status:  ?Body mass index is 23.83 kg/m?Marland Kitchen  ?Nutrition Problem: Severe Malnutrition ?Etiology: chronic illness (COPD) ?Signs/Symptoms: severe muscle depletion, severe fat depletion ? ? ? ? ?Diet:  ?Diet Order   ? ?       ?  Diet regular Room service appropriate? Yes; Fluid consistency: Thin  Diet effective now       ?  ? ?  ?  ? ?  ? ? ?DVT prophylaxis:  ?enoxaparin (LOVENOX) injection 40 mg Start: 10/09/21 1100 ?  ?Antimicrobials: Cefepime, vancomycin ?Fluid: None ?Consultants: Pulmonology ?Family Communication: None at bedside ? ?Status is: Inpatient ? ?Continue in-hospital care because: As trach color, core track ?Level of care: Telemetry Medical  ? ?Dispo: The patient is from: Home ?             Anticipated d/c is to: Pending clinical course ?             Patient currently is not medically stable to d/c. ?  Difficult to place patient No ? ? ? ? ?Infusions:  ? sodium chloride Stopped (10/17/21 0748)  ? sodium chloride    ? ceFEPime (MAXIPIME) IV    ? [START ON 10/25/2021] vancomycin    ? ? ?Scheduled Meds: ? arformoterol  15 mcg Nebulization BID  ? aspirin  81 mg Per NG tube Daily  ? budesonide (PULMICORT) nebulizer solution  0.5 mg Nebulization BID  ? chlorhexidine gluconate (MEDLINE KIT)  15 mL Mouth Rinse BID  ? Chlorhexidine Gluconate Cloth  6 each Topical Daily  ? cholestyramine  4 g Per Tube TID  ? clonazePAM  0.5 mg Per Tube TID  ? enoxaparin (LOVENOX) injection  40 mg Subcutaneous Q24H  ? feeding supplement  237 mL Oral BID BM  ? feeding supplement (OSMOLITE 1.5 CAL)  1,000 mL Per Tube Q24H  ? feeding supplement (PROSource TF)  45 mL Per Tube BID  ? free water  100 mL Per Tube Q4H  ? furosemide  40 mg Per Tube Daily  ? Gerhardt's butt cream   Topical QID  ? insulin aspart  0-20 Units Subcutaneous Q4H  ? metoprolol succinate  12.5 mg Oral Daily  ? montelukast  10 mg Per Tube q AM  ? oxyCODONE  5 mg Per Tube Q8H  ? polyethylene glycol  17 g Per Tube Daily  ? QUEtiapine  25 mg Per Tube q  morning  ? QUEtiapine  50 mg Per Tube QHS  ? revefenacin  175 mcg Nebulization Daily  ? rosuvastatin  20 mg Per NG tube Daily  ? senna  1 tablet Per Tube Daily  ? ? ?PRN meds: ?sodium chloride, acetaminophen **OR** acetaminophen, docusate, guaiFENesin-dextromethorphan, levalbuterol, liver oil-zinc oxide, ondansetron (ZOFRAN) IV, white petrolatum  ? ?Antimicrobials: ?Anti-infectives (From admission, onward)  ? ? Start     Dose/Rate Route Frequency Ordered Stop  ? 10/25/21 0400  vancomycin (VANCOCIN) IVPB 1000 mg/200 mL premix       ? 1,000 mg ?200 mL/hr over 60 Minutes Intravenous Every 24 hours 10/24/21 0411    ? 10/24/21 1600  ceFEPIme (MAXIPIME) 2 g in sodium chloride 0.9 % 100 mL IVPB       ? 2 g ?200 mL/hr over 30 Minutes Intravenous Every 12 hours 10/24/21 0411    ? 10/24/21  0430  ceFEPIme (MAXIPIME) 2 g in sodium chloride 0.9 % 100 mL IVPB       ? 2 g ?200 mL/hr over 30 Minutes Intravenous  Once 10/24/21 0343 10/24/21 0719  ? 10/24/21 0400  vancomycin (VANCOREADY) IVPB 1250 mg/250 mL       ? 1,250 mg ?166.7 mL/hr over 90 Minutes Intravenous  Once 10/24/21 0343 10/24/21 0603  ? 10/06/21 1330  cefTRIAXone (ROCEPHIN) 2 g in sodium chloride 0.9 % 100 mL IVPB       ? 2 g ?200 mL/hr over 30 Minutes Intravenous Every 24 hours 10/06/21 1242 10/08/21 1529  ? 10/04/21 0900  ceFEPIme (MAXIPIME) 2 g in sodium chloride 0.9 % 100 mL IVPB  Status:  Discontinued       ? 2 g ?200 mL/hr over 30 Minutes Intravenous Every 12 hours 10/04/21 0816 10/06/21 1242  ? 10/03/21 0900  cefTRIAXone (ROCEPHIN) 1 g in sodium chloride 0.9 % 100 mL IVPB  Status:  Discontinued       ? 1 g ?200 mL/hr over 30 Minutes Intravenous Every 24 hours 10/03/21 0819 10/04/21 0816  ? 09/25/21 2030  azithromycin (ZITHROMAX) tablet 500 mg       ?See Hyperspace for full Linked Orders Report.  ? 500 mg Oral Daily 09/24/21 2020 09/28/21 0829  ? 09/24/21 2020  azithromycin (ZITHROMAX) 500 mg in sodium chloride 0.9 % 250 mL IVPB       ?See Hyperspace for full  Linked Orders Report.  ? 500 mg ?250 mL/hr over 60 Minutes Intravenous Every 24 hours 09/24/21 2020 09/24/21 2225  ? ?  ? ? ?Objective: ?Vitals:  ? 10/24/21 0859 10/24/21 0911  ?BP: 112/62 (!) 108/55  ?Pulse: Marland Kitchen

## 2021-10-24 NOTE — Progress Notes (Signed)
Physical Therapy Treatment ?Patient Details ?Name: [name] ?MRN: [id number] ?DOB: 1955-11-09 ?Today's Date: 10/24/2021 ? ? ?History of Present Illness 66 y/o F reports diagnosis of PNA on 4/16 Admitted 4/17 to [facility], ICU with acute hypoxic/hypercarbic respiratory failure, failed BiPAP and required mechanical ventilation, transferred to [facility] 4/21 due to new finding of LV dysfunction Echo 4/20 with EF 40% , akinesis of apex and septal wall. . Failed weaning attempt 4/28. Trach placed on 5/1. PMH significant for COPD, tobacco use, HLD, GRED, and uncontrolled anxiety. ? ?  ?PT Comments  ? ? Patient progressing towards physical therapy goals. Able to sit EOB with close supervision, however when asked to scoot hips forward towards EOB she had anterior LOB requiring assistance to recover. Patient able to progress OOB to recliner with maxA+2 but taking small pivotal steps towards R. Continues to be limited by anxious behaviors with mobility but improved since last session. D/c plan remains appropriate.  ?  ?Recommendations for follow up therapy are one component of a multi-disciplinary discharge planning process, led by the attending physician.  Recommendations may be updated based on patient status, additional functional criteria and insurance authorization. ? ?Follow Up Recommendations ? PT at Long-term acute care hospital ?  ?  ?Assistance Recommended at Discharge Frequent or constant Supervision/Assistance  ?Patient can return home with the following Two people to help with walking and/or transfers;Two people to help with bathing/dressing/bathroom;Assist for transportation;Help with stairs or ramp for entrance ?  ?Equipment Recommendations ? BSC/3in1;Wheelchair (measurements PT);Wheelchair cushion (measurements PT);Other (comment);Hospital bed (hoyer lift)  ?  ?Recommendations for Other Services   ? ? ?  ?Precautions / Restrictions Precautions ?Precautions: Fall ?Precaution Comments: trach,  cortrak ?Restrictions ?Weight Bearing Restrictions: No  ?  ? ?Mobility ? Bed Mobility ?Overal bed mobility: Needs Assistance ?Bed Mobility: Rolling, Sidelying to Sit ?Rolling: Min assist ?Sidelying to sit: Mod assist, HOB elevated ?  ?  ?  ?General bed mobility comments: Pt able to roll to side once her knees were bent, then required mod A to bring BLE off hte bed and elevate trunk to sitting. ?  ? ?Transfers ?Overall transfer level: Needs assistance ?Equipment used: 2 person hand held assist ?Transfers: Sit to/from Stand, Bed to chair/wheelchair/BSC ?Sit to Stand: Max assist, +2 physical assistance, +2 safety/equipment ?Stand pivot transfers: Max assist, +2 physical assistance, +2 safety/equipment ?  ?  ?  ?  ?General transfer comment: Able to stand from EOB with maxA+2 and initiate pivotal steps to recliner on R with maxA+2 to maintain balance ?  ? ?Ambulation/Gait ?  ?  ?  ?  ?  ?  ?  ?  ? ? ?Stairs ?  ?  ?  ?  ?  ? ? ?Wheelchair Mobility ?  ? ?Modified Rankin (Stroke Patients Only) ?  ? ? ?  ?Balance Overall balance assessment: Needs assistance ?Sitting-balance support: No upper extremity supported, Feet supported ?Sitting balance-Leahy Scale: Fair ?Sitting balance - Comments: Able to sit with no assist, however would not be able to accept a challenge ?  ?Standing balance support: Bilateral upper extremity supported ?Standing balance-Leahy Scale: Zero ?Standing balance comment: Pt unable to hold her self up in standing without max +2 assist ?  ?  ?  ?  ?  ?  ?  ?  ?  ?  ?  ?  ? ?  ?Cognition Arousal/Alertness: Awake/alert ?Behavior During Therapy: [facility] for tasks assessed/performed, Anxious ?Overall Cognitive Status: No family/caregiver present to determine baseline cognitive  functioning ?  ?  ?  ?  ?  ?  ?  ?  ?  ?  ?  ?  ?  ?  ?  ?  ?General Comments: Pt follows all commands with increased time, A&Ox4, aware of safety and deficits, continues to demonstrate anxiousness with mobility ?  ?  ? ?  ?Exercises   ? ?   ?General Comments General comments (skin integrity, edema, etc.): VSS, PMV on ?  ?  ? ?Pertinent Vitals/Pain Pain Assessment ?Pain Assessment: No/denies pain  ? ? ?Home Living   ?  ?  ?  ?  ?  ?  ?  ?  ?  ?   ?  ?Prior Function    ?  ?  ?   ? ?PT Goals (current goals can now be found in the care plan section) Acute Rehab PT Goals ?PT Goal Formulation: With patient ?Time For Goal Achievement: 11/06/21 ?Potential to Achieve Goals: Fair ?Progress towards PT goals: Progressing toward goals ? ?  ?Frequency ? ? ? Min 3X/week ? ? ? ?  ?PT Plan Current plan remains appropriate  ? ? ?Co-evaluation PT/OT/SLP Co-Evaluation/Treatment: Yes ?Reason for Co-Treatment: Complexity of the patient's impairments (multi-system involvement);For patient/therapist safety;To address functional/ADL transfers ?PT goals addressed during session: Mobility/safety with mobility;Balance ?OT goals addressed during session: ADL's and self-care;Strengthening/ROM ?  ? ?  ?AM-PAC PT "6 Clicks" Mobility   ?Outcome Measure ? Help needed turning from your back to your side while in a flat bed without using bedrails?: A Lot ?Help needed moving from lying on your back to sitting on the side of a flat bed without using bedrails?: A Lot ?Help needed moving to and from a bed to a chair (including a wheelchair)?: Total ?Help needed standing up from a chair using your arms (e.g., wheelchair or bedside chair)?: Total ?Help needed to walk in hospital room?: Total ?Help needed climbing 3-5 steps with a railing? : Total ?6 Click Score: 8 ? ?  ?End of Session Equipment Utilized During Treatment: Gait belt;Oxygen ?Activity Tolerance: Patient tolerated treatment well ?Patient left: in chair;with call bell/phone within reach;with chair alarm set ?Nurse Communication: Mobility status;Need for lift equipment ?PT Visit Diagnosis: Other abnormalities of gait and mobility (R26.89);Muscle weakness (generalized) (M62.81);Difficulty in walking, not elsewhere classified  (R26.2) ?  ? ? ?Time: 6073-7106 ?PT Time Calculation (min) (ACUTE ONLY): 26 min ? ?Charges:  $Therapeutic Activity: 8-22 mins          ?          ? ?Tyese Finken A. Dan Humphreys, PT, DPT ?Acute Rehabilitation Services ?Pager [phone] ?Office [phone] ? ? ? ?Gaylin Osoria A Lanaya Bennis ?10/24/2021, 5:40 PM ? ?

## 2021-10-24 NOTE — Progress Notes (Signed)
Occupational Therapy Treatment ?Patient Details ?Name: [name] ?MRN: [id number] ?DOB: [birth year] ?Today's Date: 10/24/2021 ? ? ?History of present illness 66 y/o F reports diagnosis of PNA on 4/16 Admitted 4/17 to [facility], ICU with acute hypoxic/hypercarbic respiratory failure, failed BiPAP and required mechanical ventilation, transferred to [facility] 4/21 due to new finding of LV dysfunction Echo 4/20 with EF 40% , akinesis of apex and septal wall. . Failed weaning attempt 4/28. Trach placed on 5/1. PMH significant for COPD, tobacco use, HLD, GRED, and uncontrolled anxiety. ?  ?OT comments ? Pt making progress with OT goals. This session pt tolerated sitting EOB for 10+ mins unsupported completing functional tasks. Pt then was able to stand and transfer to recliner with max A +2 HHA. She continues to get anxious with mobility, however is able to push through it more each session. OT to continue to follow.   ? ?Recommendations for follow up therapy are one component of a multi-disciplinary discharge planning process, led by the attending physician.  Recommendations may be updated based on patient status, additional functional criteria and insurance authorization. ?   ?Follow Up Recommendations ? OT at Long-term acute care hospital  ?  ?Assistance Recommended at Discharge Frequent or constant Supervision/Assistance  ?Patient can return home with the following ? Two people to help with walking and/or transfers;Two people to help with bathing/dressing/bathroom;Assistance with cooking/housework;Assistance with feeding;Direct supervision/assist for medications management;Direct supervision/assist for financial management;Assist for transportation;Help with stairs or ramp for entrance ?  ?Equipment Recommendations ? Other (comment) (TBD)  ?  ?Recommendations for Other Services   ? ?  ?Precautions / Restrictions Precautions ?Precautions: Fall ?Precaution Comments: trach, cortrak ?Restrictions ?Weight Bearing  Restrictions: No  ? ? ?  ? ?Mobility Bed Mobility ?Overal bed mobility: Needs Assistance ?Bed Mobility: Rolling, Sidelying to Sit ?Rolling: Min assist ?Sidelying to sit: Mod assist, HOB elevated ?  ?  ?  ?General bed mobility comments: Pt able to roll to side once her knees were bent, then required mod A to bring BLE off hte bed and elevate trunk to sitting. ?  ? ?Transfers ?Overall transfer level: Needs assistance ?Equipment used: 2 person hand held assist ?Transfers: Sit to/from Stand, Bed to chair/wheelchair/BSC ?Sit to Stand: Max assist, +2 physical assistance, +2 safety/equipment ?Stand pivot transfers: Max assist, +2 physical assistance, +2 safety/equipment ?  ?  ?  ?  ?General transfer comment: Pt able to work on engaging her LE to come to standing then, while weight bearing, moving BLE to pivot ?  ?  ?Balance Overall balance assessment: Needs assistance ?Sitting-balance support: No upper extremity supported, Feet supported ?Sitting balance-Leahy Scale: Fair ?Sitting balance - Comments: Able to sit with no assist, however would not be able to accept a challenge ?  ?Standing balance support: Bilateral upper extremity supported ?Standing balance-Leahy Scale: Zero ?Standing balance comment: Pt unable to hold her self up in standing without max +2 assist ?  ?  ?  ?  ?  ?  ?  ?  ?  ?  ?  ?   ? ?ADL either performed or assessed with clinical judgement  ? ?ADL Overall ADL's : Needs assistance/impaired ?  ?  ?  ?  ?  ?  ?  ?  ?  ?  ?  ?  ?Toilet Transfer: Maximal assistance;+2 for physical assistance;+2 for safety/equipment;Stand-pivot ?Toilet Transfer Details (indicate cue type and reason): Pt able to stand from EOB with max A and required +2 to assist with pivot,  was attempting to move BLE for pivot ?  ?  ?  ?  ?Functional mobility during ADLs: Maximal assistance;+2 for physical assistance;+2 for safety/equipment ?General ADL Comments: Working on sitting balance and sit<>stands/transfers ?  ? ?Extremity/Trunk  Assessment   ?  ?  ?  ?  ?  ? ?Vision   ?  ?  ?Perception   ?  ?Praxis   ?  ? ?Cognition Arousal/Alertness: Awake/alert ?Behavior During Therapy: [facility] for tasks assessed/performed, Anxious ?Overall Cognitive Status: No family/caregiver present to determine baseline cognitive functioning ?  ?  ?  ?  ?  ?  ?  ?  ?  ?  ?  ?  ?  ?  ?  ?  ?General Comments: Pt follows all commands with increased time, O&Ax4, aware of safety and deficits, continues to demonstrate anxiousness with mobility ?  ?  ?   ?Exercises   ? ?  ?Shoulder Instructions   ? ? ?  ?General Comments VSS, PMV on  ? ? ?Pertinent Vitals/ Pain       Pain Assessment ?Pain Assessment: No/denies pain ? ?Home Living   ?  ?  ?  ?  ?  ?  ?  ?  ?  ?  ?  ?  ?  ?  ?  ?  ?  ?  ? ?  ?Prior Functioning/Environment    ?  ?  ?  ?   ? ?Frequency ? Min 2X/week  ? ? ? ? ?  ?Progress Toward Goals ? ?OT Goals(current goals can now be found in the care plan section) ? Progress towards OT goals: Progressing toward goals ? ?Acute Rehab OT Goals ?Patient Stated Goal: To get stronger and go home ?OT Goal Formulation: With patient ?Time For Goal Achievement: 11/07/21 ?Potential to Achieve Goals: Good ?ADL Goals ?Additional ADL Goal #1: Pt will complete rolling in bed both sides with mod assist as a precurser to sitting EOB. ?Additional ADL Goal #2: Pt will visually scan room and find 3 objects with independence. ?Additional ADL Goal #3: Pt will complete AAROM of BUE with 50%/mod assist. ?Additional ADL Goal #4: Pt will hold and utilize tooth brush sponge to sucessfully brush her teeth and mouth with min A.  ?Plan Discharge plan remains appropriate   ? ?Co-evaluation ? ? ? PT/OT/SLP Co-Evaluation/Treatment: Yes ?Reason for Co-Treatment: Complexity of the patient's impairments (multi-system involvement);For patient/therapist safety;To address functional/ADL transfers ?  ?OT goals addressed during session: ADL's and self-care;Strengthening/ROM ?  ? ?  ?AM-PAC OT "6 Clicks" Daily Activity      ?Outcome Measure ? ? Help from another person eating meals?: A Little ?Help from another person taking care of personal grooming?: A Little ?Help from another person toileting, which includes using toliet, bedpan, or urinal?: A Lot ?Help from another person bathing (including washing, rinsing, drying)?: A Lot ?Help from another person to put on and taking off regular upper body clothing?: A Lot ?Help from another person to put on and taking off regular lower body clothing?: Total ?6 Click Score: 13 ? ?  ?End of Session Equipment Utilized During Treatment: Oxygen;Gait belt ? ?OT Visit Diagnosis: Unsteadiness on feet (R26.81);Other abnormalities of gait and mobility (R26.89);Muscle weakness (generalized) (M62.81) ?  ?Activity Tolerance Patient tolerated treatment well ?  ?Patient Left in chair;with call bell/phone within reach;with chair alarm set;with family/visitor present ?  ?Nurse Communication Mobility status;Need for lift equipment ?  ? ?   ? ?Time: [id number] ?OT Time Calculation (  min): 26 min ? ?Charges: OT General Charges ?$OT Visit: 1 Visit ?OT Treatments ?$Therapeutic Activity: 8-22 mins ? ?Solomon Skowronek H., OTR/L ?Acute Rehabilitation ? ?Matthan Sledge Elane Yolanda Bonine ?10/24/2021, 5:07 PM ?

## 2021-10-25 ENCOUNTER — Encounter (HOSPITAL_COMMUNITY): Payer: Medicare Other

## 2021-10-25 DIAGNOSIS — J9602 Acute respiratory failure with hypercapnia: Secondary | ICD-10-CM | POA: Diagnosis not present

## 2021-10-25 DIAGNOSIS — J9601 Acute respiratory failure with hypoxia: Secondary | ICD-10-CM | POA: Diagnosis not present

## 2021-10-25 LAB — COMPREHENSIVE METABOLIC PANEL
ALT: 60 U/L — ABNORMAL HIGH (ref 0–44)
AST: 74 U/L — ABNORMAL HIGH (ref 15–41)
Albumin: 3 g/dL — ABNORMAL LOW (ref 3.5–5.0)
Alkaline Phosphatase: 203 U/L — ABNORMAL HIGH (ref 38–126)
Anion gap: 7 (ref 5–15)
BUN: 13 mg/dL (ref 8–23)
CO2: 22 mmol/L (ref 22–32)
Calcium: 8.5 mg/dL — ABNORMAL LOW (ref 8.9–10.3)
Chloride: 110 mmol/L (ref 98–111)
Creatinine, Ser: 0.85 mg/dL (ref 0.44–1.00)
GFR, Estimated: >60 mL/min (ref >60)
Glucose, Bld: 126 mg/dL — ABNORMAL HIGH (ref 70–99)
Potassium: 3.8 mmol/L (ref 3.5–5.1)
Sodium: 139 mmol/L (ref 135–145)
Total Bilirubin: 3.2 mg/dL — ABNORMAL HIGH (ref 0.3–1.2)
Total Protein: 5.4 g/dL — ABNORMAL LOW (ref 6.5–8.1)

## 2021-10-25 LAB — CBC WITH DIFFERENTIAL/PLATELET
Abs Immature Granulocytes: 0.74 10*3/uL — ABNORMAL HIGH (ref 0.00–0.07)
Basophils Absolute: 0.1 10*3/uL (ref 0.0–0.1)
Basophils Relative: 1 %
Eosinophils Absolute: 0.1 10*3/uL (ref 0.0–0.5)
Eosinophils Relative: 1 %
HCT: 29 % — ABNORMAL LOW (ref 36.0–46.0)
Hemoglobin: 9.3 g/dL — ABNORMAL LOW (ref 12.0–15.0)
Immature Granulocytes: 5 %
Lymphocytes Relative: 9 %
Lymphs Abs: 1.5 10*3/uL (ref 0.7–4.0)
MCH: 31.8 pg (ref 26.0–34.0)
MCHC: 32.1 g/dL (ref 30.0–36.0)
MCV: 99.3 fL (ref 80.0–100.0)
Monocytes Absolute: 1.1 10*3/uL — ABNORMAL HIGH (ref 0.1–1.0)
Monocytes Relative: 7 %
Neutro Abs: 12.3 10*3/uL — ABNORMAL HIGH (ref 1.7–7.7)
Neutrophils Relative %: 77 %
Platelets: 250 10*3/uL (ref 150–400)
RBC: 2.92 MIL/uL — ABNORMAL LOW (ref 3.87–5.11)
RDW: 14.7 % (ref 11.5–15.5)
WBC: 15.8 10*3/uL — ABNORMAL HIGH (ref 4.0–10.5)
nRBC: 0.1 % (ref 0.0–0.2)

## 2021-10-25 LAB — MAGNESIUM: Magnesium: 1.7 mg/dL (ref 1.7–2.4)

## 2021-10-25 LAB — PHOSPHORUS: Phosphorus: 4 mg/dL (ref 2.5–4.6)

## 2021-10-25 LAB — GLUCOSE, CAPILLARY
Glucose-Capillary: 154 mg/dL — ABNORMAL HIGH (ref 70–99)
Glucose-Capillary: 82 mg/dL (ref 70–99)
Glucose-Capillary: 124 mg/dL — ABNORMAL HIGH (ref 70–99)
Glucose-Capillary: 84 mg/dL (ref 70–99)
Glucose-Capillary: 148 mg/dL — ABNORMAL HIGH (ref 70–99)

## 2021-10-25 LAB — AMMONIA: Ammonia: 29 umol/L (ref 9–35)

## 2021-10-25 LAB — LACTIC ACID, PLASMA: Lactic Acid, Venous: 1.6 mmol/L (ref 0.5–1.9)

## 2021-10-25 MED ORDER — METOPROLOL TARTRATE 12.5 MG HALF TABLET
12.5000 mg | ORAL_TABLET | Freq: Two times a day (BID) | ORAL | Status: DC
Start: 2021-10-25 — End: 2021-11-06
  Administered 2021-10-25 – 2021-11-05 (×23): 12.5 mg
  Filled 2021-10-25 (×23): qty 1

## 2021-10-25 MED ORDER — HYDROXYZINE HCL 25 MG PO TABS
25.0000 mg | ORAL_TABLET | Freq: Once | ORAL | Status: AC
  Administered 2021-10-25: 25 mg
  Filled 2021-10-25: qty 1

## 2021-10-25 MED ORDER — METOPROLOL TARTRATE 12.5 MG HALF TABLET: 12.5000 mg | ORAL_TABLET | Freq: Two times a day (BID) | ORAL | Status: DC

## 2021-10-25 MED ORDER — QUETIAPINE FUMARATE 50 MG PO TABS
50.0000 mg | ORAL_TABLET | Freq: Two times a day (BID) | ORAL | Status: DC
  Administered 2021-10-25 – 2021-10-28 (×7): 50 mg
  Filled 2021-10-25 (×8): qty 1

## 2021-10-25 NOTE — Progress Notes (Signed)
PROGRESS NOTE  [name]  DOB: 07/12/1955  PCP: Everardo Beals, NP PHX:[id number]  DOA: 09/24/2021  LOS: 43 days  Hospital Day: 33  Brief narrative: [name] is a 66 y.o. female with PMH significant for GOLD 3 COPD, quit smoking 2021, uncontrolled anxiety. Patient presented to ED on 4/17 with complaint of difficulty breathing, cough, wheezing, fatigue with symptoms worsening for 1 week.  EMS noted hypoxia. Patient required BiPAP in the ED and was admitted to hospitalist service.  However her respiratory status continued to worsen. On 4/19 she was intubated and transferred to ICU. Work-up in ICU showed a low EF of 40% (4/20).  She was not able to be weaned off ventilator. 5/1, patient underwent tracheostomy. 5/15, transferred out to [facility]   Subjective: Patient was seen and examined this morning. Lying down in bed.  On 8 L oxygen via tracheostomy.  Alert, awake, able to answer questions.  Sister at bedside.  Even Prolastin 20.  Patient had frequent episodes of agitation and uncontrolled anxiety.  Principal Problem:   Acute respiratory failure with hypoxia and hypercapnia (HCC) Active Problems:   COPD exacerbation (HCC)   Hypokalemia   Anxiety   GERD (gastroesophageal reflux disease)   Hyperglycemia   Lactic acidosis   Hypernatremia   Heart failure with mid-range ejection fraction (HFmEF) (HCC)   Protein-calorie malnutrition, severe   Acute encephalopathy    Assessment and Plan: Acute respiratory failure with hypoxia and hypercapnia Tracheostomy/vent dependent respiratory failure Severe COPD -Patient was admitted initially with COPD exacerbation.  Respiratory status worsened and required intubation which got prolonged and subsequently required tracheostomy on 5/1.   -Continue trach care per PCCM -Currently on trach collar, 8-10 L/min.  Continue wean down as tolerated. -Continue Brovana, Pulmicort scheduled and Xopenex PRN   Hospital acquired  pneumonia Sepsis-5/17 -Tracheal aspirate (4/27) significant for klebsiella pneumoniae infection. Patient completed 6 days of antibiotic therapy with Ceftriaxone/Cefepime. -However, 5/16, patient was in respiratory distress again.  WBC count and lactic acid level were elevated.  Patient was started on IV antibiotics for presumed sepsis.  Blood cultures sent. -Adequately hydrated.  Not on IV fluid Recent Labs  Lab 10/24/21 0425 10/24/21 0525 10/24/21 0837 10/25/21 0155  WBC 15.0*  --   --  15.8*  LATICACIDVEN 1.9 2.3* 1.6 1.6  PROCALCITON 0.10  --   --   --     Acute systolic heart failure -0/16, echocardiogram showed LVEF of 40% with focal WMA.  -Cardiology recommended outpatient ischemic evaluation. -Currently on metoprolol, Lasix.  I will stop scheduled Lasix at this time as she does not look volume overloaded.   Acute metabolic encephalopathy Severe uncontrolled anxiety -Continue Klonopin and Seroquel.  I will increase Seroquel dose today to better control agitation. -Required a dose of IV Ativan today.   Severe malnutrition Cortrak feeding tube placed on 4/28. Speech therapy on board. Discussed with patient and she is agreeable to PEG tube if she is unable to transition to oral intake. Patient advanced to a regular diet on 5/11. Tube feeds transitioned to overnight. -Continue regular diet -Attempts to wean tube feeds.  Speech therapy following   Hyperlipidemia -Continue Crestor   Hyperglycemia Hemoglobin A1C of 5.4% Recent Labs  Lab 10/24/21 2017 10/24/21 2310 10/25/21 0328 10/25/21 0725 10/25/21 1138  GLUCAP 147* 113* 154* 82 124*    Goals of care   Code Status: Full Code    Mobility: PT following.  LTAC was recommended.  Insurance denied.  Per case management,  CIR to review once tracheostomy decannulated.  Skin assessment:     Nutritional status:  Body mass index is 23.7 kg/m.  Nutrition Problem: Severe Malnutrition Etiology: chronic illness  (COPD) Signs/Symptoms: severe muscle depletion, severe fat depletion     Diet:  Diet Order             Diet regular Room service appropriate? Yes; Fluid consistency: Thin  Diet effective now                   DVT prophylaxis:  enoxaparin (LOVENOX) injection 40 mg Start: 10/09/21 1100   Antimicrobials: Cefepime, vancomycin Fluid: None Consultants: Pulmonology Family Communication: Sister at bedside  Status is: Inpatient  Continue in-hospital care because: hass trach color, core track Level of care: Telemetry Medical   Dispo: The patient is from: Home              Anticipated d/c is to: Pending clinical course              Patient currently is not medically stable to d/c.   Difficult to place patient No     Infusions:   sodium chloride Stopped (10/17/21 0748)   sodium chloride     ceFEPime (MAXIPIME) IV Stopped (10/25/21 0330)   vancomycin Stopped (10/25/21 0445)    Scheduled Meds:  arformoterol  15 mcg Nebulization BID   aspirin  81 mg Per NG tube Daily   budesonide (PULMICORT) nebulizer solution  0.5 mg Nebulization BID   chlorhexidine gluconate (MEDLINE KIT)  15 mL Mouth Rinse BID   Chlorhexidine Gluconate Cloth  6 each Topical Daily   cholestyramine  4 g Per Tube TID   clonazePAM  0.5 mg Per Tube TID   enoxaparin (LOVENOX) injection  40 mg Subcutaneous Q24H   feeding supplement  237 mL Oral BID BM   feeding supplement (OSMOLITE 1.5 CAL)  1,000 mL Per Tube Q24H   feeding supplement (PROSource TF)  45 mL Per Tube BID   free water  100 mL Per Tube Q4H   furosemide  40 mg Per Tube Daily   Gerhardt's butt cream   Topical QID   insulin aspart  0-20 Units Subcutaneous Q4H   metoprolol tartrate  12.5 mg Per Tube BID   montelukast  10 mg Per Tube q AM   oxyCODONE  5 mg Per Tube Q8H   polyethylene glycol  17 g Per Tube Daily   QUEtiapine  50 mg Per Tube BID   revefenacin  175 mcg Nebulization Daily   rosuvastatin  20 mg Per NG tube Daily   senna  1 tablet  Per Tube Daily    PRN meds: sodium chloride, acetaminophen **OR** acetaminophen, docusate, guaiFENesin-dextromethorphan, levalbuterol, liver oil-zinc oxide, ondansetron (ZOFRAN) IV, white petrolatum   Antimicrobials: Anti-infectives (From admission, onward)    Start     Dose/Rate Route Frequency Ordered Stop   10/25/21 0400  vancomycin (VANCOCIN) IVPB 1000 mg/200 mL premix        1,000 mg 200 mL/hr over 60 Minutes Intravenous Every 24 hours 10/24/21 0411     10/24/21 1600  ceFEPIme (MAXIPIME) 2 g in sodium chloride 0.9 % 100 mL IVPB        2 g 200 mL/hr over 30 Minutes Intravenous Every 12 hours 10/24/21 0411     10/24/21 0430  ceFEPIme (MAXIPIME) 2 g in sodium chloride 0.9 % 100 mL IVPB        2 g 200 mL/hr over 30 Minutes Intravenous  Once 10/24/21 0343 10/24/21 0719   10/24/21 0400  vancomycin (VANCOREADY) IVPB 1250 mg/250 mL        1,250 mg 166.7 mL/hr over 90 Minutes Intravenous  Once 10/24/21 0343 10/24/21 0603   10/06/21 1330  cefTRIAXone (ROCEPHIN) 2 g in sodium chloride 0.9 % 100 mL IVPB        2 g 200 mL/hr over 30 Minutes Intravenous Every 24 hours 10/06/21 1242 10/08/21 1529   10/04/21 0900  ceFEPIme (MAXIPIME) 2 g in sodium chloride 0.9 % 100 mL IVPB  Status:  Discontinued        2 g 200 mL/hr over 30 Minutes Intravenous Every 12 hours 10/04/21 0816 10/06/21 1242   10/03/21 0900  cefTRIAXone (ROCEPHIN) 1 g in sodium chloride 0.9 % 100 mL IVPB  Status:  Discontinued        1 g 200 mL/hr over 30 Minutes Intravenous Every 24 hours 10/03/21 0819 10/04/21 0816   09/25/21 2030  azithromycin (ZITHROMAX) tablet 500 mg       See Hyperspace for full Linked Orders Report.   500 mg Oral Daily 09/24/21 2020 09/28/21 0829   09/24/21 2020  azithromycin (ZITHROMAX) 500 mg in sodium chloride 0.9 % 250 mL IVPB       See Hyperspace for full Linked Orders Report.   500 mg 250 mL/hr over 60 Minutes Intravenous Every 24 hours 09/24/21 2020 09/24/21 2225       Objective: Vitals:    10/25/21 1100 10/25/21 1213  BP: 118/66   Pulse: (!) 101 100  Resp: 20 (!) 22  Temp: 97.7 F (36.5 C)   SpO2: 91% 92%    Intake/Output Summary (Last 24 hours) at 10/25/2021 1318 Last data filed at 10/25/2021 0800 Gross per 24 hour  Intake 1600 ml  Output 650 ml  Net 950 ml    Filed Weights   10/21/21 0500 10/22/21 0500 10/25/21 0428  Weight: 53.6 kg 57.2 kg 56.9 kg   Weight change:  Body mass index is 23.7 kg/m.   Physical Exam: General exam: Pleasant, middle-aged Caucasian female.  Not in physical distress Skin: No rashes, lesions or ulcers. HEENT: Atraumatic, normocephalic, no obvious bleeding.  Tracheostomy anteriorly.  Core track in place Lungs: Diminished air entry in both bases CVS: Mild tachycardia GI/Abd soft, nontender, nondistended, bowel sound present CNS: Alert, awake, oriented x3 Psychiatry: Sad affect Extremities: No pedal edema, no calf tenderness  Data Review: I have personally reviewed the laboratory data and studies available.  F/u labs ordered Unresulted Labs (From admission, onward)     Start     Ordered   10/24/21 0344  Strep pneumoniae urinary antigen  Once,   R        10/24/21 0343   10/24/21 0344  Legionella Pneumophila Serogp 1 Ur Ag  Once,   R        10/24/21 0343            Signed, Terrilee Croak, MD [facility] 10/25/2021

## 2021-10-25 NOTE — Progress Notes (Signed)
MRSA PCR neg. Ok to stop vanc per Dr Pietro Cassis.  Onnie Boer, PharmD, BCIDP, AAHIVP, CPP Infectious Disease Pharmacist 10/25/2021 3:14 PM

## 2021-10-25 NOTE — Progress Notes (Signed)
Pt repeatedly calling out throughout the night asking for anxiety meds.  Penny Post Do notified on multiple occassions and responded appropriately with 0.5mg  IV ativan ordered and given at 2014 and 25mg  Hydroxyzine per tube given at 0255.  This is in addition to pts scheduled seroquel 50mg  per tube and klonopin 0.5mg  per tube, both of which were given at 2229.  Pt reports anxiety anytime RN is in the room, however when pt is observed from outside the room with no staff present, she appears to be resting comfortably in no apparent distress.  All ordered meds given, will continue to monitor.

## 2021-10-26 DIAGNOSIS — J9601 Acute respiratory failure with hypoxia: Secondary | ICD-10-CM | POA: Diagnosis not present

## 2021-10-26 DIAGNOSIS — J9602 Acute respiratory failure with hypercapnia: Secondary | ICD-10-CM | POA: Diagnosis not present

## 2021-10-26 LAB — STREP PNEUMONIAE URINARY ANTIGEN: Strep Pneumo Urinary Antigen: NEGATIVE

## 2021-10-26 LAB — GLUCOSE, CAPILLARY
Glucose-Capillary: 108 mg/dL — ABNORMAL HIGH (ref 70–99)
Glucose-Capillary: 113 mg/dL — ABNORMAL HIGH (ref 70–99)
Glucose-Capillary: 113 mg/dL — ABNORMAL HIGH (ref 70–99)
Glucose-Capillary: 117 mg/dL — ABNORMAL HIGH (ref 70–99)
Glucose-Capillary: 121 mg/dL — ABNORMAL HIGH (ref 70–99)
Glucose-Capillary: 130 mg/dL — ABNORMAL HIGH (ref 70–99)
Glucose-Capillary: 144 mg/dL — ABNORMAL HIGH (ref 70–99)

## 2021-10-26 MED ORDER — HYDROXYZINE HCL 25 MG PO TABS
25.0000 mg | ORAL_TABLET | Freq: Three times a day (TID) | ORAL | Status: AC | PRN
  Administered 2021-10-26 – 2021-10-28 (×3): 25 mg
  Filled 2021-10-26 (×4): qty 1

## 2021-10-26 MED ORDER — OSMOLITE 1.5 CAL PO LIQD
1000.0000 mL | ORAL | Status: DC
  Administered 2021-10-26 – 2021-10-29 (×4): 1000 mL
  Filled 2021-10-26 (×7): qty 1000

## 2021-10-26 NOTE — Progress Notes (Signed)
Physical Therapy Treatment Patient Details Name: [name] MRN: [id number] DOB: 15-Sep-1955 Today's Date: 10/26/2021   History of Present Illness 66 y/o F reports diagnosis of PNA on 4/16 Admitted 4/17 to [facility], ICU with acute hypoxic/hypercarbic respiratory failure, failed BiPAP and required mechanical ventilation, transferred to [facility] 4/21 due to new finding of LV dysfunction Echo 4/20 with EF 40% , akinesis of apex and septal wall. . Failed weaning attempt 4/28. Trach placed on 5/1. PMH significant for COPD, tobacco use, HLD, GRED, and uncontrolled anxiety.    PT Comments    Patient continues to make progress towards therapy goals. Patient found soiled with urine on arrival requiring rolling for pericare. Able to achieve EOB with modA. Agreeable to standing with RW x 3, however required maxA+2 and unable to effectively utilize RW at this time to pivot to chair. Required maxA with squat pivot transfer to recliner on L. Continues to be limited overall by weakness and decreased activity tolerance. Updated discharge recommendation to AIR to maximize functional mobility and decrease burden of care.    Recommendations for follow up therapy are one component of a multi-disciplinary discharge planning process, led by the attending physician.  Recommendations may be updated based on patient status, additional functional criteria and insurance authorization.  Follow Up Recommendations  Acute inpatient rehab (3hours/day)     Assistance Recommended at Discharge Frequent or constant Supervision/Assistance  Patient can return home with the following Two people to help with walking and/or transfers;Two people to help with bathing/dressing/bathroom;Assist for transportation;Help with stairs or ramp for entrance   Equipment Recommendations  BSC/3in1;Wheelchair (measurements PT);Wheelchair cushion (measurements PT);Other (comment);Hospital bed (hoyer lift)    Recommendations for Other Services        Precautions / Restrictions Precautions Precautions: Fall Precaution Comments: trach, cortrak Restrictions Weight Bearing Restrictions: No     Mobility  Bed Mobility Overal bed mobility: Needs Assistance Bed Mobility: Rolling, Sidelying to Sit Rolling: Min assist Sidelying to sit: Mod assist, HOB elevated       General bed mobility comments: assist for bringing legs up and patient able to roll towards L side with use of bed rail. ModA for trunk elevation to EOB    Transfers Overall transfer level: Needs assistance Equipment used: Rolling Christina Waldrop (2 wheels), 1 person hand held assist Transfers: Sit to/from Stand, Bed to chair/wheelchair/BSC Sit to Stand: Max assist, +2 physical assistance, +2 safety/equipment     Squat pivot transfers: Max assist     General transfer comment: Hand over hand assist for hand placement on RW in preparation to stand due to UE weakness and cues for anterior translation prior to standing. Ultimately requiring maxA+2 to stand at EOB x 2. Unable to effectively utilize RW at this time so deferred transfer with RW Able to perform squat pivot to recliner on L wiht maxA    Ambulation/Gait                   Stairs             Wheelchair Mobility    Modified Rankin (Stroke Patients Only)       Balance Overall balance assessment: Needs assistance Sitting-balance support: No upper extremity supported, Feet supported Sitting balance-Leahy Scale: Fair     Standing balance support: Bilateral upper extremity supported Standing balance-Leahy Scale: Zero  Cognition Arousal/Alertness: Awake/alert Behavior During Therapy: WFL for tasks assessed/performed, Anxious Overall Cognitive Status: Difficult to assess Area of Impairment: Attention, Following commands, Problem solving, Awareness                   Current Attention Level: Selective   Following Commands: Follows one step  commands consistently, Follows one step commands with increased time   Awareness: Emergent Problem Solving: Slow processing, Requires verbal cues, Requires tactile cues General Comments: anxious with mobility but follows commands with increased time and cues        Exercises Other Exercises Other Exercises: sit to stand x 3    General Comments        Pertinent Vitals/Pain      Home Living                          Prior Function            PT Goals (current goals can now be found in the care plan section) Acute Rehab PT Goals Patient Stated Goal: Did not state, but agrees to getting OOB PT Goal Formulation: With patient Time For Goal Achievement: 11/06/21 Potential to Achieve Goals: Fair Progress towards PT goals: Progressing toward goals    Frequency    Min 3X/week      PT Plan Discharge plan needs to be updated    Co-evaluation              AM-PAC PT "6 Clicks" Mobility   Outcome Measure  Help needed turning from your back to your side while in a flat bed without using bedrails?: A Little Help needed moving from lying on your back to sitting on the side of a flat bed without using bedrails?: A Lot Help needed moving to and from a bed to a chair (including a wheelchair)?: Total Help needed standing up from a chair using your arms (e.g., wheelchair or bedside chair)?: Total Help needed to walk in hospital room?: Total Help needed climbing 3-5 steps with a railing? : Total 6 Click Score: 9    End of Session Equipment Utilized During Treatment: Gait belt;Oxygen Activity Tolerance: Patient tolerated treatment well Patient left: in chair;with call bell/phone within reach;with chair alarm set Nurse Communication: Mobility status;Need for lift equipment PT Visit Diagnosis: Other abnormalities of gait and mobility (R26.89);Muscle weakness (generalized) (M62.81);Difficulty in walking, not elsewhere classified (R26.2)     Time: 1610-9604 PT  Time Calculation (min) (ACUTE ONLY): 29 min  Charges:  $Therapeutic Activity: 23-37 mins                     Melany Wiesman A. Dan Humphreys PT, DPT Acute Rehabilitation Services Pager [phone] Office [phone]    Viviann Spare 10/26/2021, 12:58 PM

## 2021-10-26 NOTE — Progress Notes (Signed)
PROGRESS NOTE  [name]  DOB: [birth year]  PCP: Everardo Beals, NP PFY:[id number]  DOA: 09/24/2021  LOS: 74 days  Hospital Day: 33  Brief narrative: [name] is a 66 y.o. female with PMH significant for GOLD 3 COPD, quit smoking 2021, uncontrolled anxiety. Patient presented to ED on 4/17 with complaint of difficulty breathing, cough, wheezing, fatigue with symptoms worsening for 1 week. EMS noted hypoxia. Patient required BiPAP in the ED and was admitted to hospitalist service.  However her respiratory status continued to worsen. On 4/19, she was intubated and transferred to ICU. Work-up in ICU showed a low EF of 40% (4/20).  She was not able to be weaned off ventilator. 5/1, patient underwent tracheostomy. 5/15, transferred out to [facility]   Subjective: Patient was seen and examined this morning. Lying on bed.  Not in distress remains in tracheostomy Core track remains in place.  Principal Problem:   Acute respiratory failure with hypoxia and hypercapnia (HCC) Active Problems:   COPD exacerbation (HCC)   Hypokalemia   Anxiety   GERD (gastroesophageal reflux disease)   Hyperglycemia   Lactic acidosis   Hypernatremia   Heart failure with mid-range ejection fraction (HFmEF) (HCC)   Protein-calorie malnutrition, severe   Acute encephalopathy    Assessment and Plan: Acute respiratory failure with hypoxia and hypercapnia Tracheostomy/vent dependent respiratory failure Severe COPD -Patient was admitted initially with COPD exacerbation.  Respiratory status worsened and required intubation which got prolonged and subsequently required tracheostomy on 5/1.   -Continue trach care per PCCM -Currently on trach collar, 8-10 L/min.  Continue wean down as tolerated. -Continue Brovana, Pulmicort scheduled and Xopenex PRN   Hospital acquired pneumonia Sepsis-5/17 -Tracheal aspirate (4/27) significant for klebsiella pneumoniae infection. Patient completed 6 days of  antibiotic therapy with Ceftriaxone/Cefepime. -However, 5/16, patient was in respiratory distress again.  WBC count and lactic acid level were elevated.  Patient was started on IV antibiotics for presumed sepsis.  Blood cultures sent. -No longer having fever but WBC count was elevated yesterday.  Repeat tomorrow.  Continue antibiotics for today. -Adequately hydrated.  Not on IV fluid Recent Labs  Lab 10/24/21 0425 10/24/21 0525 10/24/21 0837 10/25/21 0155  WBC 15.0*  --   --  15.8*  LATICACIDVEN 1.9 2.3* 1.6 1.6  PROCALCITON 0.10  --   --   --    Acute systolic heart failure -8/17, echocardiogram showed LVEF of 40% with focal WMA.  -Cardiology recommended outpatient ischemic evaluation. -Currently on metoprolol.  Lasix on hold.  Acute metabolic encephalopathy Severe uncontrolled anxiety -Continue Klonopin and Seroquel.  As needed Ativan.   Severe malnutrition Cortrak feeding tube placed on 4/28. Speech therapy on board. Discussed with patient and she is agreeable to PEG tube if she is unable to transition to oral intake. Patient advanced to a regular diet on 5/11. Tube feeds transitioned to overnight. -Continue regular diet.  Speech therapy reconsulted to determine the need of continuation of core track.   Hyperlipidemia -Continue Crestor   Hyperglycemia Hemoglobin A1C of 5.4% Recent Labs  Lab 10/25/21 1629 10/25/21 2007 10/26/21 0016 10/26/21 0419 10/26/21 0715  GLUCAP 84 148* 108* 130* 117*   Goals of care   Code Status: Full Code    Mobility: PT following.  LTAC was recommended.  Insurance denied.  Per case management, CIR to review once tracheostomy decannulated.  Skin assessment:  Pressure Injury 10/25/21 Buttocks Left Deep Tissue Pressure Injury - Purple or maroon localized area of discolored intact skin or  blood-filled blister due to damage of underlying soft tissue from pressure and/or shear. (Active)  10/25/21 2230  Location: Buttocks  Location Orientation:  Left  Staging: Deep Tissue Pressure Injury - Purple or maroon localized area of discolored intact skin or blood-filled blister due to damage of underlying soft tissue from pressure and/or shear.  Wound Description (Comments):   Present on Admission:     Nutritional status:  Body mass index is 23.7 kg/m.  Nutrition Problem: Severe Malnutrition Etiology: chronic illness (COPD) Signs/Symptoms: severe muscle depletion, severe fat depletion     Diet:  Diet Order             Diet regular Room service appropriate? Yes; Fluid consistency: Thin  Diet effective now                   DVT prophylaxis:  enoxaparin (LOVENOX) injection 40 mg Start: 10/09/21 1100   Antimicrobials: Cefepime Fluid: None Consultants: Pulmonology Family Communication: None at bedside  Status is: Inpatient  Continue in-hospital care because: hass trach color, core track Level of care: Telemetry Medical   Dispo: The patient is from: Home              Anticipated d/c is to: Pending clinical course              Patient currently is not medically stable to d/c.   Difficult to place patient No     Infusions:   sodium chloride Stopped (10/17/21 0748)   sodium chloride     ceFEPime (MAXIPIME) IV 2 g (10/26/21 0444)    Scheduled Meds:  arformoterol  15 mcg Nebulization BID   aspirin  81 mg Per NG tube Daily   budesonide (PULMICORT) nebulizer solution  0.5 mg Nebulization BID   chlorhexidine gluconate (MEDLINE KIT)  15 mL Mouth Rinse BID   Chlorhexidine Gluconate Cloth  6 each Topical Daily   cholestyramine  4 g Per Tube TID   clonazePAM  0.5 mg Per Tube TID   enoxaparin (LOVENOX) injection  40 mg Subcutaneous Q24H   feeding supplement  237 mL Oral BID BM   feeding supplement (OSMOLITE 1.5 CAL)  1,000 mL Per Tube Q24H   feeding supplement (PROSource TF)  45 mL Per Tube BID   free water  100 mL Per Tube Q4H   Gerhardt's butt cream   Topical QID   insulin aspart  0-20 Units Subcutaneous Q4H    metoprolol tartrate  12.5 mg Per Tube BID   montelukast  10 mg Per Tube q AM   oxyCODONE  5 mg Per Tube Q8H   polyethylene glycol  17 g Per Tube Daily   QUEtiapine  50 mg Per Tube BID   revefenacin  175 mcg Nebulization Daily   rosuvastatin  20 mg Per NG tube Daily   senna  1 tablet Per Tube Daily    PRN meds: sodium chloride, acetaminophen **OR** acetaminophen, docusate, guaiFENesin-dextromethorphan, hydrOXYzine, levalbuterol, liver oil-zinc oxide, ondansetron (ZOFRAN) IV, white petrolatum   Antimicrobials: Anti-infectives (From admission, onward)    Start     Dose/Rate Route Frequency Ordered Stop   10/25/21 0400  vancomycin (VANCOCIN) IVPB 1000 mg/200 mL premix  Status:  Discontinued        1,000 mg 200 mL/hr over 60 Minutes Intravenous Every 24 hours 10/24/21 0411 10/25/21 1513   10/24/21 1600  ceFEPIme (MAXIPIME) 2 g in sodium chloride 0.9 % 100 mL IVPB        2 g 200 mL/hr  over 30 Minutes Intravenous Every 12 hours 10/24/21 0411     10/24/21 0430  ceFEPIme (MAXIPIME) 2 g in sodium chloride 0.9 % 100 mL IVPB        2 g 200 mL/hr over 30 Minutes Intravenous  Once 10/24/21 0343 10/24/21 0719   10/24/21 0400  vancomycin (VANCOREADY) IVPB 1250 mg/250 mL        1,250 mg 166.7 mL/hr over 90 Minutes Intravenous  Once 10/24/21 0343 10/24/21 0603   10/06/21 1330  cefTRIAXone (ROCEPHIN) 2 g in sodium chloride 0.9 % 100 mL IVPB        2 g 200 mL/hr over 30 Minutes Intravenous Every 24 hours 10/06/21 1242 10/08/21 1529   10/04/21 0900  ceFEPIme (MAXIPIME) 2 g in sodium chloride 0.9 % 100 mL IVPB  Status:  Discontinued        2 g 200 mL/hr over 30 Minutes Intravenous Every 12 hours 10/04/21 0816 10/06/21 1242   10/03/21 0900  cefTRIAXone (ROCEPHIN) 1 g in sodium chloride 0.9 % 100 mL IVPB  Status:  Discontinued        1 g 200 mL/hr over 30 Minutes Intravenous Every 24 hours 10/03/21 0819 10/04/21 0816   09/25/21 2030  azithromycin (ZITHROMAX) tablet 500 mg       See Hyperspace for full  Linked Orders Report.   500 mg Oral Daily 09/24/21 2020 09/28/21 0829   09/24/21 2020  azithromycin (ZITHROMAX) 500 mg in sodium chloride 0.9 % 250 mL IVPB       See Hyperspace for full Linked Orders Report.   500 mg 250 mL/hr over 60 Minutes Intravenous Every 24 hours 09/24/21 2020 09/24/21 2225       Objective: Vitals:   10/26/21 0732 10/26/21 0945  BP:  104/73  Pulse:  (!) 105  Resp:  18  Temp:  98.1 F (36.7 C)  SpO2: 98% 97%    Intake/Output Summary (Last 24 hours) at 10/26/2021 1051 Last data filed at 10/26/2021 0641 Gross per 24 hour  Intake 2120.19 ml  Output 2300 ml  Net -179.81 ml   Filed Weights   10/21/21 0500 10/22/21 0500 10/25/21 0428  Weight: 53.6 kg 57.2 kg 56.9 kg   Weight change:  Body mass index is 23.7 kg/m.   Physical Exam: General exam: Pleasant, middle-aged Caucasian female.  Not in physical distress Skin: No rashes, lesions or ulcers. HEENT: Atraumatic, normocephalic, no obvious bleeding.  Tracheostomy anteriorly.  Core track in place Lungs: Diminished air entry in both bases CVS: Continues to have mild tachycardia. GI/Abd soft, nontender, nondistended, bowel sound present CNS: Alert, awake, oriented x3 Psychiatry: Sad affect Extremities: No pedal edema, no calf tenderness  Data Review: I have personally reviewed the laboratory data and studies available.  F/u labs ordered Unresulted Labs (From admission, onward)     Start     Ordered   10/27/21 0500  CBC with Differential/Platelet  Tomorrow morning,   R       Question:  Specimen collection method  Answer:  Lab=Lab collect   10/26/21 0742   10/27/21 0500  Comprehensive metabolic panel  Tomorrow morning,   R       Question:  Specimen collection method  Answer:  Lab=Lab collect   10/26/21 0742   10/24/21 0344  Legionella Pneumophila Serogp 1 Ur Ag  Once,   R        10/24/21 0343            Signed, Terrilee Croak, MD [facility] 10/26/2021

## 2021-10-26 NOTE — Progress Notes (Signed)
Patient added to DTP list for discharge planning.  CSW spoke with Britta Mccreedy of CIR who states she will review patient and return call to CSW.  Edwin Dada, MSW, LCSW Transitions of Care  Clinical Social Worker II [phone]

## 2021-10-26 NOTE — Progress Notes (Signed)
Chaplain responded to consult for Advance Directives. Pt was asleep in her bed. Will continue to follow.   Please page as further needs arise.  Donald Prose. Elyn Peers, M.Div. [facility] Chaplain Pager [phone] Office [phone]

## 2021-10-26 NOTE — Progress Notes (Signed)
Nutrition Follow-up  DOCUMENTATION CODES:   Severe malnutrition in context of chronic illness  INTERVENTION:   Recommend PEG tube placement at this time given continued poor po intake despite max interventions, severe malnutirtion, DTI to buttocks and increased needs secondary to acute and chronic illness; dicussed with Attending MD and Care Team  Tube Feeding via Cortrak: transition back to full feedings over 24 hours Osmolite 1.5 at 50 ml/hr Pro-Source TF 45 mL BID This provides 1880 kcals, 97 g of protein and 972 mL of free water  Continue Regular diet  Continue Ensure Enlive po BID, each supplement provides 350 kcal and 20 grams of protein.   NUTRITION DIAGNOSIS:   Severe Malnutrition related to chronic illness (COPD) as evidenced by severe muscle depletion, severe fat depletion.  Being addressed via TF, plan for PEG  GOAL:   Patient will meet greater than or equal to 90% of their needs  Progressing   MONITOR:   PO intake, Labs, Weight trends, Skin, I & O's, TF tolerance  REASON FOR ASSESSMENT:   Ventilator    ASSESSMENT:   Pt admitted with difficulty breathing d/t acute respiratory failure with hypoxia and hypercapnia. Pt with diagnosis of PNA 4/16. PMH significant for COPD, uncontrolled anxiety.  4/18 Admitted 4/28 Cortrak placed 5/01 Trach 5/11 Diet advanced to Regular 5/12 Change to nocturnal TF via Cortrak 5/19 TF changed back to 24 hours full feedings, recommended PEG  Tolerating nocturnal TF via Cortrak  PO intake remains very inadequate. Pt not eating anything from meal trays. RD checked in at meal time every day this week and lunch tray was always untouched; Nurse Tech who has been with pt most of the week indicates the same. Pt not eating meal trays, drinking Milk. Per RN, pt took bites of ice cream and some Ensure today  SLP also worked with pt today, pt only wanting to eat bites. From swallowing perspective, pt remains appropriate for Regular  diet with Thin Liquids. Pt is on a Regular diet; pt reports she has an appetite and food is appealing but when she starts to eat, she an only take a few bites if anything and then cannot eat any more.   Pt with DTI to buttocks; pt complains of wound pain   Current wt 56.9 kg; lowest wt 51 kg  Labs: reviewed Meds: questran, ss novolog, miralax  Diet Order:   Diet Order             Diet regular Room service appropriate? Yes; Fluid consistency: Thin  Diet effective now                   EDUCATION NEEDS:   No education needs have been identified at this time  Skin:  Skin Assessment: Skin Integrity Issues: Skin Integrity Issues:: DTI DTI: buttocks  Last BM:  5/17  Height:   Ht Readings from Last 1 Encounters:  09/25/21 5\' 1"  (1.549 m)    Weight:   Wt Readings from Last 1 Encounters:  10/25/21 56.9 kg     BMI:  Body mass index is 23.7 kg/m.  Estimated Nutritional Needs:   Kcal:  1700-1900 kcals  Protein:  90-110 g  Fluid:  >/= 1.7 L   10/27/21 MS, RDN, LDN, CNSC Registered Dietitian III Clinical Nutrition RD Pager and On-Call Pager Number Located in [place]

## 2021-10-26 NOTE — Progress Notes (Signed)
° °  Inpatient Rehab Admissions Coordinator : ° °Per therapy change in recommendations, patient was screened for CIR candidacy by Judythe Postema RN MSN.  At this time patient appears to be a potential candidate for CIR. I will place a rehab consult per protocol for full assessment. Please call me with any questions. ° °Anijah Spohr RN MSN °Admissions Coordinator °[phone] °  °

## 2021-10-26 NOTE — Evaluation (Signed)
Clinical/Bedside Swallow Evaluation Patient Details  Name: JIMISHA ASKELAND MRN: [id number] Date of Birth: 06/01/1956  Today's Date: 10/26/2021 Time: SLP Start Time (ACUTE ONLY): [id number] SLP Stop Time (ACUTE ONLY): 1330 SLP Time Calculation (min) (ACUTE ONLY): 25 min  Past Medical History:  Past Medical History:  Diagnosis Date   Anxiety    Chronic back pain    COPD (chronic obstructive pulmonary disease) ([place])    Tobacco abuse    Past Surgical History:  Past Surgical History:  Procedure Laterality Date   ABDOMINAL HYSTERECTOMY     BACK SURGERY     ILEO LOOP NEOBLADDER     VESICOVAGINAL FISTULA REPAIR     HPI:  Pt is a 66 yo female presenting to [facility] with acute hypoxic/hypercarbic respiratory failure requiring intubation 4/19 followed by transfer to [facility] after new finding of LV dysfunction. Trach 5/1. PMH includes: COPD, previously on home O2 but "took herself off," former smoker (quit 2021), uncontrolled anxiety    Assessment / Plan / Recommendation  Clinical Impression  New order received to reassess swallow to determine need for continued tube feeds. Pt had lunch tray in front of her, but refused anything on the tray. She continued to refuse any and all items SLP offered. She eventually took a few sips of water, Ensure, and juice. No overt s/s aspiration observed on any trial. She adamantly refused any solid trials.   Pt underwent instrumental swallow study via FEES 10/18/21, with recommendation for regular solids and thin liquids. Recommend continuing regular diet with thin liquids. Pt's difficulty does not seem to be related to dysphagia or PMSV/trach, but instead pt's lack of appetite and refusal to take more than a few bites or sips. Perhaps dietician could offer some additional insight.   ST signing off for dysphagia. Will continue to follow for PMSV education. Pt was given a new valve today, but refused to place it herself. Tolerance of the valve, and Independence with valve donning  and doffing are the sole goals of SLP at this point.  SLP Visit Diagnosis: Dysphagia, unspecified (R13.10);Aphonia (R49.1)    Aspiration Risk  Mild aspiration risk    Diet Recommendation Regular;Thin liquid   Liquid Administration via: Cup;Straw Medication Administration: Whole meds with puree Supervision: Patient able to self feed;Staff to assist with self feeding Compensations: Slow rate;Small sips/bites Postural Changes: Seated upright at 90 degrees    Other  Recommendations Oral Care Recommendations: Oral care BID Other Recommendations: Place PMSV during PO intake    Recommendations for follow up therapy are one component of a multi-disciplinary discharge planning process, led by the attending physician.  Recommendations may be updated based on patient status, additional functional criteria and insurance authorization.  Follow up Recommendations Skilled nursing-short term rehab (<3 hours/day)      Assistance Recommended at Discharge Frequent or constant Supervision/Assistance  Functional Status Assessment Patient has had a recent decline in their functional status and demonstrates the ability to make significant improvements in function in a reasonable and predictable amount of time.  Frequency and Duration min 1 x/week  2 weeks       Prognosis Prognosis for Safe Diet Advancement: Good      Swallow Study   General Date of Onset: 09/24/21 HPI: Pt is a 66 yo female presenting to [facility] with acute hypoxic/hypercarbic respiratory failure requiring intubation 4/19 followed by transfer to [facility] after new finding of LV dysfunction. Trach 5/1. PMH includes: COPD, previously on home O2 but "took herself off," former smoker (quit  2021), uncontrolled anxiety Type of Study: Bedside Swallow Evaluation Previous Swallow Assessment: FEES 10/18/21 - rec regular solids, thin liquids Diet Prior to this Study: Regular;Thin liquids Temperature Spikes Noted: No Respiratory Status: Trach;Trach  Collar Trach Size and Type: Uncuffed;#6;With PMSV in place History of Recent Intubation: Yes Length of Intubations (days): 12 days Date extubated: 10/08/21 Behavior/Cognition: Alert;Cooperative Oral Cavity Assessment: Dry Oral Care Completed by SLP: No Oral Cavity - Dentition: Adequate natural dentition Vision: Functional for self-feeding Self-Feeding Abilities: Able to feed self;Needs assist Patient Positioning: Upright in chair Baseline Vocal Quality: Hoarse;Low vocal intensity Volitional Cough: Strong;Congested Volitional Swallow: Able to elicit    Oral/Motor/Sensory Function Overall Oral Motor/Sensory Function: Within functional limits   Ice Chips Ice chips: Not tested   Thin Liquid Thin Liquid: Within functional limits Presentation: Straw    Nectar Thick Nectar Thick Liquid: Not tested   Honey Thick Honey Thick Liquid: Not tested   Puree Puree: Not tested   Solid     Solid: Not tested     Harshith Pursell B. Quentin Ore, [facility], [place] Speech Language Pathologist Office: [phone]  Shonna Chock 10/26/2021,1:44 PM

## 2021-10-27 ENCOUNTER — Inpatient Hospital Stay (HOSPITAL_COMMUNITY): Payer: Medicare Other

## 2021-10-27 DIAGNOSIS — J9602 Acute respiratory failure with hypercapnia: Secondary | ICD-10-CM | POA: Diagnosis not present

## 2021-10-27 DIAGNOSIS — J9601 Acute respiratory failure with hypoxia: Secondary | ICD-10-CM | POA: Diagnosis not present

## 2021-10-27 LAB — COMPREHENSIVE METABOLIC PANEL
ALT: 86 U/L — ABNORMAL HIGH (ref 0–44)
AST: 38 U/L (ref 15–41)
Albumin: 1.9 g/dL — ABNORMAL LOW (ref 3.5–5.0)
Alkaline Phosphatase: 200 U/L — ABNORMAL HIGH (ref 38–126)
Anion gap: 7 (ref 5–15)
BUN: 18 mg/dL (ref 8–23)
CO2: 34 mmol/L — ABNORMAL HIGH (ref 22–32)
Calcium: 8.6 mg/dL — ABNORMAL LOW (ref 8.9–10.3)
Chloride: 98 mmol/L (ref 98–111)
Creatinine, Ser: <0.3 mg/dL — ABNORMAL LOW (ref 0.44–1.00)
Glucose, Bld: 141 mg/dL — ABNORMAL HIGH (ref 70–99)
Potassium: 3.8 mmol/L (ref 3.5–5.1)
Sodium: 139 mmol/L (ref 135–145)
Total Bilirubin: 0.4 mg/dL (ref 0.3–1.2)
Total Protein: 6.3 g/dL — ABNORMAL LOW (ref 6.5–8.1)

## 2021-10-27 LAB — GLUCOSE, CAPILLARY
Glucose-Capillary: 150 mg/dL — ABNORMAL HIGH (ref 70–99)
Glucose-Capillary: 152 mg/dL — ABNORMAL HIGH (ref 70–99)
Glucose-Capillary: 155 mg/dL — ABNORMAL HIGH (ref 70–99)
Glucose-Capillary: 108 mg/dL — ABNORMAL HIGH (ref 70–99)
Glucose-Capillary: 130 mg/dL — ABNORMAL HIGH (ref 70–99)

## 2021-10-27 LAB — CBC WITH DIFFERENTIAL/PLATELET
Abs Immature Granulocytes: 0.69 10*3/uL — ABNORMAL HIGH (ref 0.00–0.07)
Basophils Absolute: 0.1 10*3/uL (ref 0.0–0.1)
Basophils Relative: 1 %
Eosinophils Absolute: 0 10*3/uL (ref 0.0–0.5)
Eosinophils Relative: 0 %
HCT: 29.6 % — ABNORMAL LOW (ref 36.0–46.0)
Hemoglobin: 9.2 g/dL — ABNORMAL LOW (ref 12.0–15.0)
Immature Granulocytes: 5 %
Lymphocytes Relative: 10 %
Lymphs Abs: 1.4 10*3/uL (ref 0.7–4.0)
MCH: 31.6 pg (ref 26.0–34.0)
MCHC: 31.1 g/dL (ref 30.0–36.0)
MCV: 101.7 fL — ABNORMAL HIGH (ref 80.0–100.0)
Monocytes Absolute: 1.1 10*3/uL — ABNORMAL HIGH (ref 0.1–1.0)
Monocytes Relative: 8 %
Neutro Abs: 11.3 10*3/uL — ABNORMAL HIGH (ref 1.7–7.7)
Neutrophils Relative %: 76 %
Platelets: 314 10*3/uL (ref 150–400)
RBC: 2.91 MIL/uL — ABNORMAL LOW (ref 3.87–5.11)
RDW: 14.7 % (ref 11.5–15.5)
WBC: 14.6 10*3/uL — ABNORMAL HIGH (ref 4.0–10.5)
nRBC: 0 % (ref 0.0–0.2)

## 2021-10-27 MED ORDER — LORAZEPAM 2 MG/ML IJ SOLN
1.0000 mg | Freq: Once | INTRAMUSCULAR | Status: AC
  Administered 2021-10-27: 1 mg via INTRAVENOUS
  Filled 2021-10-27: qty 1

## 2021-10-27 NOTE — Progress Notes (Signed)
   10/27/21 1050  Assess: MEWS Score  Temp 97.8 F (36.6 C)  BP 130/68  Pulse Rate (!) 102  Resp (!) 21  SpO2 92 %  O2 Device Tracheostomy Collar  Patient Activity (if Appropriate) In bed  O2 Flow Rate (L/min) 10 L/min  Assess: MEWS Score  MEWS Temp 0  MEWS Systolic 0  MEWS Pulse 1  MEWS RR 1  MEWS LOC 0  MEWS Score 2  MEWS Score Color Yellow  Assess: if the MEWS score is Yellow or Red  Were vital signs taken at a resting state? Yes  Focused Assessment No change from prior assessment  MEWS guidelines implemented *See Row Information* Yes  Treat  MEWS Interventions Administered scheduled meds/treatments;Administered prn meds/treatments  Pain Scale 0-10  Pain Score 0  Patients Stated Pain Goal 0  Pain Intervention(s) Relaxation;Repositioned  Take Vital Signs  Increase Vital Sign Frequency  Yellow: Q 2hr X 2 then Q 4hr X 2, if remains yellow, continue Q 4hrs  Escalate  MEWS: Escalate Yellow: discuss with charge nurse/RN and consider discussing with provider and RRT  Document  Patient Outcome Stabilized after interventions

## 2021-10-27 NOTE — Progress Notes (Signed)
Inpatient Rehab Admissions:  Inpatient Rehab Consult received.  I met with patient at the bedside for rehabilitation assessment and to discuss goals and expectations of an inpatient rehab admission.  Pt acknowledged that she is interested in pursuing CIR. Pt gave permission to contact sister Roslyn. Spoke with [employer]. She acknowledged understanding of CIR goals and expectations. [name] is supportive of pt receiving post-acute therapy. However,  she would like to discuss CIR with pt and family to help determine if they can provide 24/7 support for pt after discharge. Will continue to follow.  Signed: Gayland Curry, [place], [place] Admissions Coordinator [phone]

## 2021-10-27 NOTE — Progress Notes (Signed)
PROGRESS NOTE  [name]  DOB: [birth year]  PCP: Everardo Beals, NP VHQ:[id number]  DOA: 09/24/2021  LOS: 37 days  Hospital Day: 41  Brief narrative: [name] is a 66 y.o. female with PMH significant for GOLD 3 COPD, quit smoking 2021, uncontrolled anxiety. Patient presented to ED on 4/17 with complaint of difficulty breathing, cough, wheezing, fatigue with symptoms worsening for 1 week. EMS noted hypoxia. Patient required BiPAP in the ED and was admitted to hospitalist service.  However her respiratory status continued to worsen. On 4/19, she was intubated and transferred to ICU. Work-up in ICU showed a low EF of 40% (4/20).  She was not able to be weaned off ventilator. 5/1, patient underwent tracheostomy. 5/15, transferred out to [facility]   Subjective: Patient was seen and examined this morning. Lying on bed.  Not in distress remains in tracheostomy Core track remains in place. Underwent CT abdomen in preparation of a PEG tube placement.  Principal Problem:   Acute respiratory failure with hypoxia and hypercapnia (HCC) Active Problems:   COPD exacerbation (HCC)   Hypokalemia   Anxiety   GERD (gastroesophageal reflux disease)   Hyperglycemia   Lactic acidosis   Hypernatremia   Heart failure with mid-range ejection fraction (HFmEF) (HCC)   Protein-calorie malnutrition, severe   Acute encephalopathy    Assessment and Plan: Acute respiratory failure with hypoxia and hypercapnia Tracheostomy/vent dependent respiratory failure Severe COPD -Patient was admitted initially with COPD exacerbation.  Respiratory status worsened and required intubation which got prolonged and subsequently required tracheostomy on 5/1.   -Continue trach care per PCCM -Currently on trach collar, 8-10 L/min.  Continue wean down as tolerated. -Continue Brovana, Pulmicort scheduled and Xopenex PRN   Hospital acquired pneumonia Sepsis-5/17 -Tracheal aspirate (4/27) significant for  klebsiella pneumoniae infection. Patient completed 6 days of antibiotic therapy with Ceftriaxone/Cefepime. -However, 5/16, patient was in respiratory distress again.  WBC count and lactic acid level were elevated.  Patient was started on IV antibiotics for presumed sepsis.  Blood cultures sent. -No longer having fever but WBC count was elevated yesterday.  Repeat tomorrow.  Continue antibiotics for today. -Adequately hydrated.  Not on IV fluid Recent Labs  Lab 10/24/21 0425 10/24/21 0525 10/24/21 0837 10/25/21 0155 10/27/21 0432  WBC 15.0*  --   --  15.8* 14.6*  LATICACIDVEN 1.9 2.3* 1.6 1.6  --   PROCALCITON 0.10  --   --   --   --     Acute systolic heart failure -4/13, echocardiogram showed LVEF of 40% with focal WMA.  -Cardiology recommended outpatient ischemic evaluation. -Currently on metoprolol.  Lasix on hold.  Acute metabolic encephalopathy Severe uncontrolled anxiety -Continue Klonopin and Seroquel.  As needed Ativan.   Severe malnutrition Cortrak feeding tube placed on 4/28. Speech therapy on board. Discussed with patient and she is agreeable to PEG tube if she is unable to transition to oral intake. Patient advanced to a regular diet on 5/11. Tube feeds transitioned to overnight. -Continue regular diet but patient has very poor appetite. IR was consulted to consider PEG tube feeding. Underwent CT abd today in preperation of that.    Hyperlipidemia -Continue Crestor   Hyperglycemia Hemoglobin A1C of 5.4% Recent Labs  Lab 10/26/21 2130 10/26/21 2353 10/27/21 0351 10/27/21 0732 10/27/21 1118  GLUCAP 144* 113* 150* 152* 155*    Goals of care   Code Status: Full Code    Mobility: PT following.  LTAC was recommended.  Insurance denied.  Per case  management, CIR to review once tracheostomy decannulated.  Skin assessment:  Pressure Injury 10/25/21 Buttocks Left Deep Tissue Pressure Injury - Purple or maroon localized area of discolored intact skin or  blood-filled blister due to damage of underlying soft tissue from pressure and/or shear. (Active)  10/25/21 2230  Location: Buttocks  Location Orientation: Left  Staging: Deep Tissue Pressure Injury - Purple or maroon localized area of discolored intact skin or blood-filled blister due to damage of underlying soft tissue from pressure and/or shear.  Wound Description (Comments):   Present on Admission:     Nutritional status:  Body mass index is 23.45 kg/m.  Nutrition Problem: Severe Malnutrition Etiology: chronic illness (COPD) Signs/Symptoms: severe muscle depletion, severe fat depletion     Diet:  Diet Order             Diet regular Room service appropriate? Yes; Fluid consistency: Thin  Diet effective now                   DVT prophylaxis:  enoxaparin (LOVENOX) injection 40 mg Start: 10/09/21 1100   Antimicrobials: Cefepime Fluid: None Consultants: Pulmonology Family Communication: None at bedside  Status is: Inpatient  Continue in-hospital care because: hass trach color, core track Level of care: Telemetry Medical   Dispo: The patient is from: Home              Anticipated d/c is to: Pending clinical course              Patient currently is not medically stable to d/c.   Difficult to place patient No     Infusions:   sodium chloride Stopped (10/17/21 0748)   sodium chloride     ceFEPime (MAXIPIME) IV 2 g (10/27/21 0501)   feeding supplement (OSMOLITE 1.5 CAL) 1,000 mL (10/27/21 1127)    Scheduled Meds:  arformoterol  15 mcg Nebulization BID   aspirin  81 mg Per NG tube Daily   budesonide (PULMICORT) nebulizer solution  0.5 mg Nebulization BID   chlorhexidine gluconate (MEDLINE KIT)  15 mL Mouth Rinse BID   Chlorhexidine Gluconate Cloth  6 each Topical Daily   cholestyramine  4 g Per Tube TID   clonazePAM  0.5 mg Per Tube TID   enoxaparin (LOVENOX) injection  40 mg Subcutaneous Q24H   feeding supplement  237 mL Oral BID BM   feeding supplement  (PROSource TF)  45 mL Per Tube BID   free water  100 mL Per Tube Q4H   Gerhardt's butt cream   Topical QID   insulin aspart  0-20 Units Subcutaneous Q4H   metoprolol tartrate  12.5 mg Per Tube BID   montelukast  10 mg Per Tube q AM   oxyCODONE  5 mg Per Tube Q8H   polyethylene glycol  17 g Per Tube Daily   QUEtiapine  50 mg Per Tube BID   revefenacin  175 mcg Nebulization Daily   rosuvastatin  20 mg Per NG tube Daily   senna  1 tablet Per Tube Daily    PRN meds: sodium chloride, acetaminophen **OR** acetaminophen, docusate, guaiFENesin-dextromethorphan, hydrOXYzine, levalbuterol, liver oil-zinc oxide, ondansetron (ZOFRAN) IV, white petrolatum   Antimicrobials: Anti-infectives (From admission, onward)    Start     Dose/Rate Route Frequency Ordered Stop   10/25/21 0400  vancomycin (VANCOCIN) IVPB 1000 mg/200 mL premix  Status:  Discontinued        1,000 mg 200 mL/hr over 60 Minutes Intravenous Every 24 hours 10/24/21 0411 10/25/21  1513   10/24/21 1600  ceFEPIme (MAXIPIME) 2 g in sodium chloride 0.9 % 100 mL IVPB        2 g 200 mL/hr over 30 Minutes Intravenous Every 12 hours 10/24/21 0411     10/24/21 0430  ceFEPIme (MAXIPIME) 2 g in sodium chloride 0.9 % 100 mL IVPB        2 g 200 mL/hr over 30 Minutes Intravenous  Once 10/24/21 0343 10/24/21 0719   10/24/21 0400  vancomycin (VANCOREADY) IVPB 1250 mg/250 mL        1,250 mg 166.7 mL/hr over 90 Minutes Intravenous  Once 10/24/21 0343 10/24/21 0603   10/06/21 1330  cefTRIAXone (ROCEPHIN) 2 g in sodium chloride 0.9 % 100 mL IVPB        2 g 200 mL/hr over 30 Minutes Intravenous Every 24 hours 10/06/21 1242 10/08/21 1529   10/04/21 0900  ceFEPIme (MAXIPIME) 2 g in sodium chloride 0.9 % 100 mL IVPB  Status:  Discontinued        2 g 200 mL/hr over 30 Minutes Intravenous Every 12 hours 10/04/21 0816 10/06/21 1242   10/03/21 0900  cefTRIAXone (ROCEPHIN) 1 g in sodium chloride 0.9 % 100 mL IVPB  Status:  Discontinued        1 g 200 mL/hr  over 30 Minutes Intravenous Every 24 hours 10/03/21 0819 10/04/21 0816   09/25/21 2030  azithromycin (ZITHROMAX) tablet 500 mg       See Hyperspace for full Linked Orders Report.   500 mg Oral Daily 09/24/21 2020 09/28/21 0829   09/24/21 2020  azithromycin (ZITHROMAX) 500 mg in sodium chloride 0.9 % 250 mL IVPB       See Hyperspace for full Linked Orders Report.   500 mg 250 mL/hr over 60 Minutes Intravenous Every 24 hours 09/24/21 2020 09/24/21 2225       Objective: Vitals:   10/27/21 1050 10/27/21 1150  BP: 130/68   Pulse: (!) 102 97  Resp: (!) 21 (!) 22  Temp: 97.8 F (36.6 C)   SpO2: 92% 93%    Intake/Output Summary (Last 24 hours) at 10/27/2021 1413 Last data filed at 10/27/2021 0800 Gross per 24 hour  Intake 903 ml  Output 1575 ml  Net -672 ml    Filed Weights   10/22/21 0500 10/25/21 0428 10/27/21 0600  Weight: 57.2 kg 56.9 kg 56.3 kg   Weight change:  Body mass index is 23.45 kg/m.   Physical Exam: General exam: Pleasant, middle-aged Caucasian female.  Not in physical distress Skin: No rashes, lesions or ulcers. HEENT: Atraumatic, normocephalic, no obvious bleeding.  Tracheostomy anteriorly.  Core track in place Lungs: Diminished air entry in both bases CVS: Continues to have mild tachycardia. GI/Abd soft, nontender, nondistended, bowel sound present CNS: Alert, awake, oriented x3 Psychiatry: Sad affect Extremities: No pedal edema, no calf tenderness  Data Review: I have personally reviewed the laboratory data and studies available.  F/u labs ordered Unresulted Labs (From admission, onward)     Start     Ordered   10/24/21 0344  Legionella Pneumophila Serogp 1 Ur Ag  Once,   R        10/24/21 0343            Signed, Terrilee Croak, MD [facility] 10/27/2021

## 2021-10-27 NOTE — Progress Notes (Signed)
Pharmacy Antibiotic Note  [name] is a 66 y.o. female admitted on 09/24/2021, now with concern for new pneumonia.  Pharmacy consulted 5/17 cefepime dosing.  Vancomycin discontinued as discussed with Dr. Pola Corn on 5/18 due to MRSA PCR negative.  Cefepime contineus for HCAP.  wbc 15.8>14.6., Afebrile.   Blood cx  5/17 no growth to date.  Plan: Continue Cefepime 2g IV Q12H. Monitor clinical status, renal function and culture results daily.    Height: 5\' 1"  (154.9 cm) Weight: 56.3 kg (124 lb 1.9 oz) IBW/kg (Calculated) : 47.8  Temp (24hrs), Avg:98 F (36.7 C), Min:97.6 F (36.4 C), Max:98.6 F (37 C)  CrCl cannot be calculated (This lab value cannot be used to calculate CrCl because it is not a number: <0.30).   No Known Allergies   Thank you for allowing pharmacy to be a part of this patient's care.  , RPh Clinical Pharmacist 10/27/2021 4:34 PM

## 2021-10-27 NOTE — Progress Notes (Signed)
   10/27/21 1728  Assess: MEWS Score  Temp 97.9 F (36.6 C)  BP 134/69  Pulse Rate 94  Resp 20  SpO2 92 %  O2 Device Tracheostomy Collar  Assess: MEWS Score  MEWS Temp 0  MEWS Systolic 0  MEWS Pulse 0  MEWS RR 0  MEWS LOC 0  MEWS Score 0  MEWS Score Color Green  Assess: if the MEWS score is Yellow or Red  Were vital signs taken at a resting state? Yes  Focused Assessment No change from prior assessment  Treat  MEWS Interventions Administered scheduled meds/treatments;Administered prn meds/treatments  Pain Scale 0-10  Pain Score 0  Patients Stated Pain Goal 0  Pain Intervention(s) Rest  Document  Patient Outcome Transferred/level of care increased

## 2021-10-28 ENCOUNTER — Inpatient Hospital Stay (HOSPITAL_COMMUNITY): Payer: Medicare Other

## 2021-10-28 DIAGNOSIS — J9602 Acute respiratory failure with hypercapnia: Secondary | ICD-10-CM | POA: Diagnosis not present

## 2021-10-28 DIAGNOSIS — J9601 Acute respiratory failure with hypoxia: Secondary | ICD-10-CM | POA: Diagnosis not present

## 2021-10-28 LAB — GLUCOSE, CAPILLARY
Glucose-Capillary: 117 mg/dL — ABNORMAL HIGH (ref 70–99)
Glucose-Capillary: 119 mg/dL — ABNORMAL HIGH (ref 70–99)
Glucose-Capillary: 126 mg/dL — ABNORMAL HIGH (ref 70–99)
Glucose-Capillary: 129 mg/dL — ABNORMAL HIGH (ref 70–99)
Glucose-Capillary: 131 mg/dL — ABNORMAL HIGH (ref 70–99)
Glucose-Capillary: 140 mg/dL — ABNORMAL HIGH (ref 70–99)
Glucose-Capillary: 152 mg/dL — ABNORMAL HIGH (ref 70–99)

## 2021-10-28 MED ORDER — QUETIAPINE FUMARATE 50 MG PO TABS
75.0000 mg | ORAL_TABLET | Freq: Two times a day (BID) | ORAL | Status: DC
  Administered 2021-10-28 – 2021-11-05 (×16): 75 mg
  Filled 2021-10-28 (×17): qty 1

## 2021-10-28 MED ORDER — LORAZEPAM 2 MG/ML IJ SOLN
1.0000 mg | Freq: Four times a day (QID) | INTRAMUSCULAR | Status: DC | PRN
  Administered 2021-10-28 – 2021-11-14 (×20): 1 mg via INTRAVENOUS
  Filled 2021-10-28 (×21): qty 1

## 2021-10-28 MED ORDER — GUAIFENESIN 100 MG/5ML PO LIQD
10.0000 mL | Freq: Two times a day (BID) | ORAL | Status: AC
  Administered 2021-10-29 – 2021-10-31 (×4): 10 mL via ORAL
  Filled 2021-10-28 (×4): qty 15

## 2021-10-28 MED ORDER — IOHEXOL 350 MG/ML SOLN
80.0000 mL | Freq: Once | INTRAVENOUS | Status: AC | PRN
  Administered 2021-10-28: 60 mL via INTRAVENOUS

## 2021-10-28 MED ORDER — SODIUM CHLORIDE 3 % IN NEBU
4.0000 mL | INHALATION_SOLUTION | Freq: Two times a day (BID) | RESPIRATORY_TRACT | Status: DC
  Administered 2021-10-29: 4 mL via RESPIRATORY_TRACT
  Filled 2021-10-28 (×2): qty 4

## 2021-10-28 NOTE — Progress Notes (Signed)
   10/28/21 1325  Clinical Encounter Type  Visited With Patient  Visit Type Initial;Other (Comment) (Advanced Directive)  Referral From Nurse  Consult/Referral To Chaplain   Chaplain responded to a spiritual consult request for advanced directive education.  Patient, [name], was happy to see me and expressed that she would go over the paperwork later in the day that right now she was anxious and wanted to see a nurse. I left the paperwork and located her nurse and passed on [name]'s request.   Wynona Neat  [facility]  [phone]

## 2021-10-28 NOTE — Progress Notes (Signed)
Called by bedside RN due to the patient's desaturating on 8L to the 80's.  O2 requirement increased to 11L.  Presented at bedside.  The patient is alert and calm after receiving a dose of IV ativan.  She has cortrak tube feedings in place.  Lungs sounds are coarse bilaterally.  Ordered chest x-ray which showed bilateral pulmonary infiltrates consistent with multifocal pneumonia, personally reviewed.   The patient is on Cefepime.  Per bedside RN, the patient has had a difficult time bringing up her secretions.  Hypersaline nebs, chest physiotherapy ordered.  We will continue to closely monitor and treat as indicated.

## 2021-10-28 NOTE — Progress Notes (Signed)
Request to IR for gastrostomy placement - CT abd w/o contrast obtained 5/20 reviewed and anatomy appears amenable to percutaneous G-tube placement.   **Additionally noted are new cluster of pulmonary nodules in the right lower lobe, CT chest w/contrast recommended if further evaluation is warranted.** (Hospitalist aware).  Patient currently on ASA 81 mg with last dose this morning and Lovenox 40 mg Q24H. Will need to hold ASA x 5 days due to high risk bleeding procedure - Dr. Pola Corn ok with ASA hold.  Plan: - Hold ASA starting 5/22 (order in [facility]) - Hold Lovenox after 5/24 dose (order in [facility]) - Plan for G-tube placement 5/26 pending IR schedule - IR APP will see for consult/consent this week  Please call IR with any questions or concerns.  Lynnette Caffey, PA-C

## 2021-10-28 NOTE — Progress Notes (Signed)
   10/28/21 1631  Assess: MEWS Score  Temp 98 F (36.7 C)  BP 137/68  Pulse Rate (!) 111  Resp 19  SpO2 94 %  Assess: MEWS Score  MEWS Temp 0  MEWS Systolic 0  MEWS Pulse 2  MEWS RR 0  MEWS LOC 0  MEWS Score 2  MEWS Score Color Yellow  Assess: if the MEWS score is Yellow or Red  Were vital signs taken at a resting state? Yes  Focused Assessment No change from prior assessment  Early Detection of Sepsis Score *See Row Information* Low  MEWS guidelines implemented *See Row Information* Yes  Treat  MEWS Interventions Administered scheduled meds/treatments;Administered prn meds/treatments  Pain Scale 0-10  Pain Score 0  Patients Stated Pain Goal 0  Pain Intervention(s) Rest  Patients response to intervention Relief  Escalate  MEWS: Escalate Yellow: discuss with charge nurse/RN and consider discussing with provider and RRT  Notify: Charge Nurse/RN  Name of Charge Nurse/RN Notified Mathis Fare  Date Charge Nurse/RN Notified 10/28/21  Time Charge Nurse/RN Notified 1640  Notify: Provider  Provider Name/Title Dahal  Date Provider Notified 10/28/21  Time Provider Notified 1645  Method of Notification Page  Notification Reason  (Yellow mews)  Provider response See new orders  Date of Provider Response 10/28/21  Time of Provider Response 1800  Document  Patient Outcome Stabilized after interventions

## 2021-10-28 NOTE — Progress Notes (Signed)
Patient complained of abdominal tightness and severe anxiety. Abdomen auscultated, bowel sounds present. Cortrak placement checked by auscultation with air, other RN Juliette Alcide confirmed.

## 2021-10-28 NOTE — Progress Notes (Signed)
PROGRESS NOTE  [name]  DOB: February 04, 1956  PCP: Everardo Beals, NP ZOX:[id number]  DOA: 09/24/2021  LOS: 57 days  Hospital Day: 43  Brief narrative: [name] is a 66 y.o. female with PMH significant for GOLD 3 COPD, quit smoking 2021, uncontrolled anxiety. Patient presented to ED on 4/17 with complaint of difficulty breathing, cough, wheezing, fatigue with symptoms worsening for 1 week. EMS noted hypoxia. Patient required BiPAP in the ED and was admitted to hospitalist service.  However her respiratory status continued to worsen. On 4/19, she was intubated and transferred to ICU. Work-up in ICU showed a low EF of 40% (4/20).  She was not able to be weaned off ventilator. 5/1, patient underwent tracheostomy. 5/15, transferred out to [facility]   Subjective: Patient was seen and examined this morning. Propped up in bed.  Not in distress.  Remains on tracheostomy and core track feeding. CT abdomen was done yesterday in preparation of PEG tube placement.  Principal Problem:   Acute respiratory failure with hypoxia and hypercapnia (HCC) Active Problems:   COPD exacerbation (HCC)   Hypokalemia   Anxiety   GERD (gastroesophageal reflux disease)   Hyperglycemia   Lactic acidosis   Hypernatremia   Heart failure with mid-range ejection fraction (HFmEF) (HCC)   Protein-calorie malnutrition, severe   Acute encephalopathy    Assessment and Plan: Acute respiratory failure with hypoxia and hypercapnia Tracheostomy/vent dependent respiratory failure Severe COPD -Patient was admitted initially with COPD exacerbation.  Respiratory status worsened and required intubation which got prolonged and subsequently required tracheostomy on 5/1.   -Continue tracheostomy care per PCCM -Currently on trach collar, 8-10 L/min.  Continue wean down as tolerated. -Continue Brovana, Pulmicort scheduled and Xopenex PRN   Hospital acquired pneumonia Sepsis-5/17 -Tracheal aspirate (4/27)  significant for klebsiella pneumoniae infection. Patient completed 6 days of antibiotic therapy with Ceftriaxone/Cefepime. -However, 5/16, patient was in respiratory distress again.  WBC count and lactic acid level were elevated.  Patient was started on IV antibiotics for presumed sepsis.  Blood cultures sent. -No longer having fever.  WBC count improving. Repeat tomorrow.  Complete 5-day course of antibiotics.   -Adequately hydrated.  Not on IV fluid Recent Labs  Lab 10/24/21 0425 10/24/21 0525 10/24/21 0837 10/25/21 0155 10/27/21 0432  WBC 15.0*  --   --  15.8* 14.6*  LATICACIDVEN 1.9 2.3* 1.6 1.6  --   PROCALCITON 0.10  --   --   --   --     Acute systolic heart failure -8/11, echocardiogram showed LVEF of 40% with focal WMA.  -Cardiology recommended outpatient ischemic evaluation. -Currently on metoprolol.  Lasix on hold.  Acute metabolic encephalopathy Severe uncontrolled anxiety -Continue Klonopin and Seroquel.  As needed Ativan.   Severe malnutrition -Cortrak feeding tube placed on 4/28. Speech therapy on board. Discussed with patient and she is agreeable to PEG tube if she is unable to transition to oral intake. Patient advanced to a regular diet on 5/11. Tube feeds transitioned to overnight. -Continue regular diet but patient has very poor appetite. IR was consulted to consider PEG tube feeding. Underwent CT abd on 5/20 in preparation of PEG tube placement  Pulmonary nodules -Multiple pulm nodules noted on CT scan obtained on 5/20.  As recommended by radiologist, I ordered for CT chest with contrast.   Hyperlipidemia -Continue Crestor   Hyperglycemia Hemoglobin A1C of 5.4% Recent Labs  Lab 10/27/21 1633 10/27/21 2005 10/28/21 0019 10/28/21 0402 10/28/21 0735  GLUCAP 108* 130* 119* 152*  131*    Goals of care   Code Status: Full Code    Mobility: PT following.  LTAC was recommended.  Insurance denied.  Per case management, CIR to review once tracheostomy  decannulated.  Skin assessment:  Pressure Injury 10/25/21 Buttocks Left Deep Tissue Pressure Injury - Purple or maroon localized area of discolored intact skin or blood-filled blister due to damage of underlying soft tissue from pressure and/or shear. (Active)  10/25/21 2230  Location: Buttocks  Location Orientation: Left  Staging: Deep Tissue Pressure Injury - Purple or maroon localized area of discolored intact skin or blood-filled blister due to damage of underlying soft tissue from pressure and/or shear.  Wound Description (Comments):   Present on Admission:     Nutritional status:  Body mass index is 23.45 kg/m.  Nutrition Problem: Severe Malnutrition Etiology: chronic illness (COPD) Signs/Symptoms: severe muscle depletion, severe fat depletion     Diet:  Diet Order             Diet regular Room service appropriate? Yes; Fluid consistency: Thin  Diet effective now                   DVT prophylaxis:  enoxaparin (LOVENOX) injection 40 mg Start: 10/09/21 1100   Antimicrobials: Cefepime Fluid: None Consultants: Pulmonology Family Communication: None at bedside  Status is: Inpatient  Continue in-hospital care because: hass trach color, core track Level of care: Telemetry Medical   Dispo: The patient is from: Home              Anticipated d/c is to: Pending clinical course              Patient currently is not medically stable to d/c.   Difficult to place patient No     Infusions:   sodium chloride Stopped (10/17/21 0748)   sodium chloride     ceFEPime (MAXIPIME) IV Stopped (10/28/21 0452)   feeding supplement (OSMOLITE 1.5 CAL) 50 mL/hr at 10/28/21 0500    Scheduled Meds:  arformoterol  15 mcg Nebulization BID   budesonide (PULMICORT) nebulizer solution  0.5 mg Nebulization BID   chlorhexidine gluconate (MEDLINE KIT)  15 mL Mouth Rinse BID   Chlorhexidine Gluconate Cloth  6 each Topical Daily   cholestyramine  4 g Per Tube TID   clonazePAM  0.5 mg  Per Tube TID   enoxaparin (LOVENOX) injection  40 mg Subcutaneous Q24H   feeding supplement  237 mL Oral BID BM   feeding supplement (PROSource TF)  45 mL Per Tube BID   free water  100 mL Per Tube Q4H   Gerhardt's butt cream   Topical QID   insulin aspart  0-20 Units Subcutaneous Q4H   metoprolol tartrate  12.5 mg Per Tube BID   montelukast  10 mg Per Tube q AM   oxyCODONE  5 mg Per Tube Q8H   polyethylene glycol  17 g Per Tube Daily   QUEtiapine  50 mg Per Tube BID   revefenacin  175 mcg Nebulization Daily   rosuvastatin  20 mg Per NG tube Daily   senna  1 tablet Per Tube Daily    PRN meds: sodium chloride, acetaminophen **OR** acetaminophen, docusate, guaiFENesin-dextromethorphan, levalbuterol, liver oil-zinc oxide, ondansetron (ZOFRAN) IV, white petrolatum   Antimicrobials: Anti-infectives (From admission, onward)    Start     Dose/Rate Route Frequency Ordered Stop   10/25/21 0400  vancomycin (VANCOCIN) IVPB 1000 mg/200 mL premix  Status:  Discontinued  1,000 mg 200 mL/hr over 60 Minutes Intravenous Every 24 hours 10/24/21 0411 10/25/21 1513   10/24/21 1600  ceFEPIme (MAXIPIME) 2 g in sodium chloride 0.9 % 100 mL IVPB        2 g 200 mL/hr over 30 Minutes Intravenous Every 12 hours 10/24/21 0411     10/24/21 0430  ceFEPIme (MAXIPIME) 2 g in sodium chloride 0.9 % 100 mL IVPB        2 g 200 mL/hr over 30 Minutes Intravenous  Once 10/24/21 0343 10/24/21 0719   10/24/21 0400  vancomycin (VANCOREADY) IVPB 1250 mg/250 mL        1,250 mg 166.7 mL/hr over 90 Minutes Intravenous  Once 10/24/21 0343 10/24/21 0603   10/06/21 1330  cefTRIAXone (ROCEPHIN) 2 g in sodium chloride 0.9 % 100 mL IVPB        2 g 200 mL/hr over 30 Minutes Intravenous Every 24 hours 10/06/21 1242 10/08/21 1529   10/04/21 0900  ceFEPIme (MAXIPIME) 2 g in sodium chloride 0.9 % 100 mL IVPB  Status:  Discontinued        2 g 200 mL/hr over 30 Minutes Intravenous Every 12 hours 10/04/21 0816 10/06/21 1242    10/03/21 0900  cefTRIAXone (ROCEPHIN) 1 g in sodium chloride 0.9 % 100 mL IVPB  Status:  Discontinued        1 g 200 mL/hr over 30 Minutes Intravenous Every 24 hours 10/03/21 0819 10/04/21 0816   09/25/21 2030  azithromycin (ZITHROMAX) tablet 500 mg       See Hyperspace for full Linked Orders Report.   500 mg Oral Daily 09/24/21 2020 09/28/21 0829   09/24/21 2020  azithromycin (ZITHROMAX) 500 mg in sodium chloride 0.9 % 250 mL IVPB       See Hyperspace for full Linked Orders Report.   500 mg 250 mL/hr over 60 Minutes Intravenous Every 24 hours 09/24/21 2020 09/24/21 2225       Objective: Vitals:   10/28/21 0844 10/28/21 0934  BP:  106/81  Pulse: (!) 102 (!) 108  Resp: (!) 22 19  Temp:  97.9 F (36.6 C)  SpO2: 94% 99%    Intake/Output Summary (Last 24 hours) at 10/28/2021 1115 Last data filed at 10/28/2021 0800 Gross per 24 hour  Intake 3720 ml  Output 1350 ml  Net 2370 ml    Filed Weights   10/22/21 0500 10/25/21 0428 10/27/21 0600  Weight: 57.2 kg 56.9 kg 56.3 kg   Weight change:  Body mass index is 23.45 kg/m.   Physical Exam: General exam: Pleasant, middle-aged Caucasian female.  Not in physical distress Skin: No rashes, lesions or ulcers. HEENT: Atraumatic, normocephalic, no obvious bleeding.  Tracheostomy anteriorly.  Core track in place Lungs: Diminished air entry in both bases CVS: Continues to have mild tachycardia. GI/Abd soft, nontender, nondistended, bowel sound present CNS: Alert, awake, oriented x3 Psychiatry: Sad affect Extremities: No pedal edema, no calf tenderness  Data Review: I have personally reviewed the laboratory data and studies available.  F/u labs ordered Unresulted Labs (From admission, onward)     Start     Ordered   10/24/21 0344  Legionella Pneumophila Serogp 1 Ur Ag  Once,   R        10/24/21 0343            Signed, Terrilee Croak, MD [facility] 10/28/2021

## 2021-10-28 NOTE — Progress Notes (Signed)
Pt start desat at shift change. Respiratory therapist at bedside.

## 2021-10-28 NOTE — Progress Notes (Signed)
Patient not due trach change at this time. Lurline Idol recently changed on 10/22/2021.

## 2021-10-28 NOTE — Progress Notes (Signed)
Called to bedside due to patient SPo2 low. Upon arrival pt was 81% with flowmeter on 15L but air entrainment device (trach collar) still set on 28% and increased rate in 30's. Pt very anxious saying "don't leave me". Pt was rhonchi bilaterally with congested cough. I increased trach collar to 98% and flowmeter down to 10L per air entrainment device settings. Pt was suctioned with copious clear/white tinged secretions. RN is contacting MD to see about further orders. According to RN her heart rate goes up after breathing treatments and pt asks for her anxiety meds prior to a breathing treatment.

## 2021-10-29 DIAGNOSIS — J9602 Acute respiratory failure with hypercapnia: Secondary | ICD-10-CM | POA: Diagnosis not present

## 2021-10-29 DIAGNOSIS — Z930 Tracheostomy status: Secondary | ICD-10-CM | POA: Diagnosis not present

## 2021-10-29 DIAGNOSIS — J9601 Acute respiratory failure with hypoxia: Secondary | ICD-10-CM | POA: Diagnosis not present

## 2021-10-29 DIAGNOSIS — J441 Chronic obstructive pulmonary disease with (acute) exacerbation: Secondary | ICD-10-CM | POA: Diagnosis not present

## 2021-10-29 LAB — CULTURE, BLOOD (ROUTINE X 2)
Culture: NO GROWTH
Culture: NO GROWTH
Special Requests: ADEQUATE
Special Requests: ADEQUATE

## 2021-10-29 LAB — LEGIONELLA PNEUMOPHILA SEROGP 1 UR AG: L. pneumophila Serogp 1 Ur Ag: NEGATIVE

## 2021-10-29 LAB — GLUCOSE, CAPILLARY
Glucose-Capillary: 109 mg/dL — ABNORMAL HIGH (ref 70–99)
Glucose-Capillary: 114 mg/dL — ABNORMAL HIGH (ref 70–99)
Glucose-Capillary: 125 mg/dL — ABNORMAL HIGH (ref 70–99)
Glucose-Capillary: 136 mg/dL — ABNORMAL HIGH (ref 70–99)
Glucose-Capillary: 138 mg/dL — ABNORMAL HIGH (ref 70–99)
Glucose-Capillary: 90 mg/dL (ref 70–99)

## 2021-10-29 MED ORDER — SODIUM CHLORIDE 3 % IN NEBU
4.0000 mL | INHALATION_SOLUTION | Freq: Four times a day (QID) | RESPIRATORY_TRACT | Status: DC
  Administered 2021-10-29 – 2021-10-30 (×5): 4 mL via RESPIRATORY_TRACT
  Administered 2021-10-30: 15 mL via RESPIRATORY_TRACT
  Administered 2021-10-30 – 2021-10-31 (×2): 4 mL via RESPIRATORY_TRACT
  Filled 2021-10-29 (×9): qty 4

## 2021-10-29 MED ORDER — ALBUTEROL SULFATE (2.5 MG/3ML) 0.083% IN NEBU
2.5000 mg | INHALATION_SOLUTION | Freq: Four times a day (QID) | RESPIRATORY_TRACT | Status: DC
  Administered 2021-10-29 – 2021-10-31 (×8): 2.5 mg via RESPIRATORY_TRACT
  Filled 2021-10-29 (×8): qty 3

## 2021-10-29 MED ORDER — ALBUTEROL SULFATE (2.5 MG/3ML) 0.083% IN NEBU: 2.5000 mg | INHALATION_SOLUTION | Freq: Four times a day (QID) | RESPIRATORY_TRACT | Status: DC

## 2021-10-29 MED ORDER — SODIUM CHLORIDE 3 % IN NEBU
4.0000 mL | INHALATION_SOLUTION | Freq: Four times a day (QID) | RESPIRATORY_TRACT | Status: DC
  Filled 2021-10-29 (×2): qty 4

## 2021-10-29 NOTE — TOC Progression Note (Signed)
Transition of Care [facility]) - Progression Note    Patient Details  Name: [name] MRN: [id number] Date of Birth: 1955-11-19  Transition of Care [facility]) CM/SW Golinda, RN Phone Number: 10/29/2021, 3:29 PM  Clinical Narrative:    CM met with the patient's sister, [name], at the bedside to discuss transitions of care and questions regarding Advanced Directives.  The therapist paged the Chaplin earlier this afternoon to discuss the Advanced directives and the patient's sister has documents available to meet with the chaplain to have documents notarized.  The patient's sister had questions relating to CIR services and message was sent to [place], CIR to have her follow up with the patient's family regarding this matter.  CM and MSW with DTP Team will continue to follow the patient for TOC needs.   Expected Discharge Plan: IP Rehab Facility Barriers to Discharge: Continued Medical Work up  Expected Discharge Plan and Services Expected Discharge Plan: [place] In-house Referral: Clinical Social Work Discharge Planning Services: CM Consult Post Acute Care Choice: IP Rehab Living arrangements for the past 2 months: Single Family Home                                       Social Determinants of Health (SDOH) Interventions    Readmission Risk Interventions    10/26/2021    5:04 PM 10/26/2021    5:02 PM  Readmission Risk Prevention Plan  Medication Review (RN Care Manager)  Complete  PCP or Specialist appointment within 3-5 days of discharge Complete   Skilled [place] Complete

## 2021-10-29 NOTE — Progress Notes (Signed)
   NAME:  [name], MRN:  [id number], DOB:  19-Dec-1955, LOS: 34 ADMISSION DATE:  09/24/2021, CONSULTATION DATE:  4/18 REFERRING MD:  Tat/ triad, CHIEF COMPLAINT:  resp distress    History of Present Illness:  66 y.o. female quit smoking 2021 with  GOLD 3 COPD MZ, uncontrolled anxiety. Was on 02 one year PTA but "took herself off" per friend at bedside and able to care for pets at home but mostly housebound sinc ethen  She was admitted 4/17 to [facility], ICU with acute hypoxic/hypercarbic respiratory failure, failed BiPAP and required mechanical ventilation, transferred to [facility] 4/21 due to new finding of LV dysfunction.  Significant Hospital Events: Including procedures, antibiotic start and stop dates in addition to other pertinent events   ET  4/18 c/b hypotension with severe air trapping  Echo 4/20 with EF 40% , akinesis of apex and septal wall 4/24 CTA head negative for any significant abnormality 4/25 ketamine added 4/28 increased work of breathing during the wean, severe auto PEEP 4/29 Febrile 101.2 5/1 perc trach placement 5/15 trach changed to [place]iley #6  cuffless 5/16 antibiotics resumed for inreasing resp distress, leukocytosis, increased LA  Interim History / Subjective:  Overnight had difficulty clearing secretions with increase oxygen requirements. Afebrile.  Objective   Blood pressure 131/64, pulse (!) 103, temperature 98 F (36.7 C), temperature source Oral, resp. rate (!) 24, height 5\' 1"  (1.549 m), weight 56.3 kg, SpO2 99 %.    FiO2 (%):  [35 %-98 %] 60 %   Intake/Output Summary (Last 24 hours) at 10/29/2021 0911 Last data filed at 10/29/2021 10/31/2021 Gross per 24 hour  Intake 1620 ml  Output 900 ml  Net 720 ml    Filed Weights   10/22/21 0500 10/25/21 0428 10/27/21 0600  Weight: 57.2 kg 56.9 kg 56.3 kg   Examination: General: chronically ill appearing woman lying in bed in NAD HEENT: [place]/AT, eyes anicteric Neck: trach in place, no erythema Cardio: S1S2,  tachycardic, reg rhythm Resp: mild tachypnea, no accessory muscle use, no wheezing, rhonchi, or rhales. Neuro: arouses easily from sleep, nodding to answer questions Psych: calm, coopreative  5/17 blood cultures> NGTD CXR personally reviewed> mild lateral RLL opacities CT 5/21 personally reviewed> nodular opacities in bilateral upper lobes--- all new since 11/2020 lung cancer screening CT.   Assessment & Plan:   #Acute hypoxemic/hypercapnic respiratory failure #Tracheostomy with vent dependent respiratory failure #HAP Klebsiella pneumonia - resolved #Resolving acute exacerbation of COPD - resolved #Newly diagnosed heart failure w/ mildly reduced EF #Acute metabolic encephalopathy - resolved #Severe anxiety #Thrombocytopenia #Severe protein-calorie malnutrition # Dysphagia # upper lobe nodules-- presumed infectious -Con't trach care per protocol.  -not a candidate for downsizing at this time due to high volume of secretions -CPT, hypertonic nebs> both increased to 4 times daily, adding albuterol 4 times daily before hypertonic -bronchodilators- brovana and yupelri -con't singulair -trach aspirate culture if recurrent fevers or changes to antibiotics; complete current course of antibiotics (day #6 cefepime) -will need follow up CT scan in 6-8 weeks to ensure complete resolution of upper lobe nodules that may be related to acute infection  PCCM will follow weekly for trach. Please call in the interim with questions.   12/2020, DO 10/29/21 9:54 AM [place] [facility]

## 2021-10-29 NOTE — Progress Notes (Addendum)
Inpatient Rehab Admissions Coordinator:  Spoke with pt's sister Rollene Fare on the telephone. She informed AC that family will not be able to provide 24/7 support for pt after discharge. She thinks pt would benefit most from a longer term therapy option. TOC made aware.  ADDENDUM 1537: Roslyn called to inform [facility] that she would like pt to pursue CIR. She confirmed that family will be able to provide 24/7 after discharge. Currently pt has high oxygen demands (10L, 60% FiO2) and is not medically ready for CIR. Will continue to follow.   Wolfgang Phoenix, MS, CCC-SLP Admissions Coordinator [phone]

## 2021-10-29 NOTE — Progress Notes (Signed)
   10/28/21 2008  Assess: MEWS Score  Temp 98 F (36.7 C)  BP 131/66  Pulse Rate (!) 112  Resp (!) 28  Level of Consciousness Alert  SpO2 100 %  O2 Device Tracheostomy Collar  Patient Activity (if Appropriate) In bed  O2 Flow Rate (L/min) 11 L/min  FiO2 (%) 98 %  Assess: MEWS Score  MEWS Temp 0  MEWS Systolic 0  MEWS Pulse 2  MEWS RR 2  MEWS LOC 0  MEWS Score 4  MEWS Score Color Red  Assess: if the MEWS score is Yellow or Red  Were vital signs taken at a resting state? Yes  Focused Assessment No change from prior assessment  Early Detection of Sepsis Score *See Row Information* High  MEWS guidelines implemented *See Row Information* Yes  Treat  MEWS Interventions Consulted Respiratory Therapy  Pain Scale 0-10  Pain Score 0  Take Vital Signs  Increase Vital Sign Frequency  Red: Q 1hr X 4 then Q 4hr X 4, if remains red, continue Q 4hrs  Escalate  MEWS: Escalate Red: discuss with charge nurse/RN and provider, consider discussing with RRT  Notify: Charge Nurse/RN  Name of Charge Nurse/RN Notified Nikki, RN  Date Charge Nurse/RN Notified 10/28/21  Time Charge Nurse/RN Notified 2000  Notify: Provider  Provider Name/Title C. Margo Aye, MD  Date Provider Notified 10/28/21  Time Provider Notified 2014  Method of Notification Page  Notification Reason Other (Comment) (Red MEWS)  Provider response En route  Date of Provider Response 10/28/21  Time of Provider Response 2030  Document  Patient Outcome Stabilized after interventions

## 2021-10-29 NOTE — Progress Notes (Signed)
PT Cancellation Note  Patient Details Name: [name] MRN: [id number] DOB: 1956/03/27   Cancelled Treatment:    Reason Eval/Treat Not Completed: Other (comment)  Pt politely declining PT this afternoon;  Sister just arrived, and they are planning to go over Advanced Directives;   Notified TOC that sister is here;  Paged Spiritual Care as well;   Will follow up later today as time allows;  Otherwise, will follow up for PT tomorrow;   Thank you,  Van Clines, PT  Acute Rehabilitation Services Pager [phone] Office [phone]    Levi Aland 10/29/2021, 1:55 PM

## 2021-10-29 NOTE — Plan of Care (Signed)
  Problem: Activity: Goal: Ability to tolerate increased activity will improve Outcome: Progressing   Problem: Respiratory: Goal: Levels of oxygenation will improve Outcome: Progressing   Problem: Clinical Measurements: Goal: Diagnostic test results will improve Outcome: Progressing

## 2021-10-30 ENCOUNTER — Encounter (HOSPITAL_COMMUNITY): Payer: Medicare Other

## 2021-10-30 ENCOUNTER — Inpatient Hospital Stay (HOSPITAL_COMMUNITY): Payer: Medicare Other

## 2021-10-30 DIAGNOSIS — J9601 Acute respiratory failure with hypoxia: Secondary | ICD-10-CM | POA: Diagnosis not present

## 2021-10-30 DIAGNOSIS — R918 Other nonspecific abnormal finding of lung field: Secondary | ICD-10-CM | POA: Diagnosis not present

## 2021-10-30 DIAGNOSIS — K56600 Partial intestinal obstruction, unspecified as to cause: Secondary | ICD-10-CM | POA: Diagnosis not present

## 2021-10-30 DIAGNOSIS — D72829 Elevated white blood cell count, unspecified: Secondary | ICD-10-CM | POA: Diagnosis not present

## 2021-10-30 LAB — CBC WITH DIFFERENTIAL/PLATELET
Abs Immature Granulocytes: 0.44 10*3/uL — ABNORMAL HIGH (ref 0.00–0.07)
Basophils Absolute: 0.1 10*3/uL (ref 0.0–0.1)
Basophils Relative: 0 %
Eosinophils Absolute: 0 10*3/uL (ref 0.0–0.5)
Eosinophils Relative: 0 %
HCT: 29.6 % — ABNORMAL LOW (ref 36.0–46.0)
Hemoglobin: 9.4 g/dL — ABNORMAL LOW (ref 12.0–15.0)
Immature Granulocytes: 2 %
Lymphocytes Relative: 10 %
Lymphs Abs: 2.1 10*3/uL (ref 0.7–4.0)
MCH: 32.1 pg (ref 26.0–34.0)
MCHC: 31.8 g/dL (ref 30.0–36.0)
MCV: 101 fL — ABNORMAL HIGH (ref 80.0–100.0)
Monocytes Absolute: 1.5 10*3/uL — ABNORMAL HIGH (ref 0.1–1.0)
Monocytes Relative: 7 %
Neutro Abs: 16.9 10*3/uL — ABNORMAL HIGH (ref 1.7–7.7)
Neutrophils Relative %: 81 %
Platelets: 327 10*3/uL (ref 150–400)
RBC: 2.93 MIL/uL — ABNORMAL LOW (ref 3.87–5.11)
RDW: 15.2 % (ref 11.5–15.5)
WBC: 21 10*3/uL — ABNORMAL HIGH (ref 4.0–10.5)
nRBC: 0 % (ref 0.0–0.2)

## 2021-10-30 LAB — BASIC METABOLIC PANEL
Anion gap: 8 (ref 5–15)
BUN: 19 mg/dL (ref 8–23)
CO2: 32 mmol/L (ref 22–32)
Calcium: 8.5 mg/dL — ABNORMAL LOW (ref 8.9–10.3)
Chloride: 96 mmol/L — ABNORMAL LOW (ref 98–111)
Creatinine, Ser: <0.3 mg/dL — ABNORMAL LOW (ref 0.44–1.00)
Glucose, Bld: 134 mg/dL — ABNORMAL HIGH (ref 70–99)
Potassium: 3.9 mmol/L (ref 3.5–5.1)
Sodium: 136 mmol/L (ref 135–145)

## 2021-10-30 LAB — PROCALCITONIN: Procalcitonin: <0.1 ng/mL

## 2021-10-30 LAB — GLUCOSE, CAPILLARY
Glucose-Capillary: 133 mg/dL — ABNORMAL HIGH (ref 70–99)
Glucose-Capillary: 113 mg/dL — ABNORMAL HIGH (ref 70–99)
Glucose-Capillary: 130 mg/dL — ABNORMAL HIGH (ref 70–99)
Glucose-Capillary: 107 mg/dL — ABNORMAL HIGH (ref 70–99)
Glucose-Capillary: 81 mg/dL (ref 70–99)
Glucose-Capillary: 101 mg/dL — ABNORMAL HIGH (ref 70–99)

## 2021-10-30 MED ORDER — IOHEXOL 300 MG/ML  SOLN
100.0000 mL | Freq: Once | INTRAMUSCULAR | Status: AC | PRN
  Administered 2021-10-30: 100 mL via INTRAVENOUS

## 2021-10-30 MED ORDER — PROCHLORPERAZINE EDISYLATE 10 MG/2ML IJ SOLN
10.0000 mg | Freq: Four times a day (QID) | INTRAMUSCULAR | Status: DC | PRN
  Administered 2021-10-30 – 2021-11-05 (×5): 10 mg via INTRAVENOUS
  Filled 2021-10-30 (×5): qty 2

## 2021-10-30 MED ORDER — SODIUM BICARBONATE 650 MG PO TABS
650.0000 mg | ORAL_TABLET | Freq: Once | ORAL | Status: AC
  Administered 2021-10-31: 650 mg
  Filled 2021-10-30: qty 1

## 2021-10-30 MED ORDER — SODIUM CHLORIDE 0.45 % IV SOLN: INTRAVENOUS | Status: DC

## 2021-10-30 MED ORDER — MORPHINE SULFATE (PF) 2 MG/ML IV SOLN
2.0000 mg | INTRAVENOUS | Status: DC | PRN
  Administered 2021-10-30 – 2021-11-05 (×6): 2 mg via INTRAVENOUS
  Administered 2021-11-05: 4 mg via INTRAVENOUS
  Administered 2021-11-11: 2 mg via INTRAVENOUS
  Filled 2021-10-30 (×7): qty 1
  Filled 2021-10-30: qty 2

## 2021-10-30 MED ORDER — PANCRELIPASE (LIP-PROT-AMYL) 10440-39150 UNITS PO TABS
20880.0000 [IU] | ORAL_TABLET | Freq: Once | ORAL | Status: AC
  Administered 2021-10-31: 20880 [IU]
  Filled 2021-10-30 (×2): qty 2

## 2021-10-30 NOTE — Plan of Care (Signed)
  Problem: Activity: Goal: Ability to tolerate increased activity will improve Outcome: Progressing   Problem: Respiratory: Goal: Ability to maintain a clear airway will improve Outcome: Progressing   Problem: Clinical Measurements: Goal: Respiratory complications will improve Outcome: Progressing   

## 2021-10-30 NOTE — Progress Notes (Signed)
SLP Cancellation Note  Patient Details Name: [name] MRN: [id number] DOB: 07/03/1955   Cancelled treatment:       Reason Eval/Treat Not Completed: Patient at procedure or test/unavailable. Pt with vomiting today and was leaving the floor for CT. Will f/u as able.    Osie Bond., M.A. [place] Office [phone]  Secure chat preferred  10/30/2021, 3:20 PM

## 2021-10-30 NOTE — Progress Notes (Signed)
RT NOTE: CPT held at this time as patient just had another vomiting episode with copious amount of dark brown liquid. Trach site cleaned and trach ties changed. RN called and notified and is getting new medication for nausea. Vitals are stable. RT will continue to monitor.

## 2021-10-30 NOTE — Progress Notes (Signed)
Holding chest vest exercise at this time due to vomiting episodes today.

## 2021-10-30 NOTE — Consult Note (Signed)
Chief Complaint: Patient was seen in consultation today for  Chief Complaint  Patient presents with   Shortness of Breath    Referring Physician(s): Dr. Pietro Cassis  Supervising Physician: Daryll Brod  Patient Status: [facility] - In-pt  History of Present Illness: [name] is a 66 y.o. female with a medical history significant for COPD, tobacco abuse and anxiety. She presented to the ED 09/24/21 with complaints of difficulty breathing, coughing, wheezing and fatigue. She was hypoxic upon arrival requiring BiPAP followed by intubation. She was treated for pneumonia and also found to have acute systolic heart failure. She underwent tracheostomy placement 10/08/21. She has been weaned from the ventilator has been transferred out of the ICU. She still requires tube feeds via Cortrak.   Interventional Radiology has been asked to evaluate this patient for an image-guided gastrostomy tube to facilitate her long-term  nutritional needs. Imaging reviewed and procedure approved by Dr. Earleen Newport.   Past Medical History:  Diagnosis Date   Anxiety    Chronic back pain    COPD (chronic obstructive pulmonary disease) (HCC)    Tobacco abuse     Past Surgical History:  Procedure Laterality Date   ABDOMINAL HYSTERECTOMY     BACK SURGERY     ILEO LOOP NEOBLADDER     VESICOVAGINAL FISTULA REPAIR      Allergies: Patient has no known allergies.  Medications: Prior to Admission medications   Medication Sig Start Date End Date Taking? Authorizing Provider  albuterol (VENTOLIN HFA) 108 (90 Base) MCG/ACT inhaler INHALE 2 PUFFS INTO THE LUNGS EVERY 6 HOURS AS NEEDED FOR WHEEZE/SHORTNESS OF BREATH 12/14/20  Yes Byrum, Rose Fillers, MD  ALPRAZolam Duanne Moron) 1 MG tablet Take 0.5-1 mg by mouth daily as needed for sleep or anxiety. 03/12/19  Yes [provider]  Budeson-Glycopyrrol-Formoterol (BREZTRI AEROSPHERE) 160-9-4.8 MCG/ACT AERO Inhale 2 puffs into the lungs 2 (two) times daily. Patient taking  differently: Inhale 2 puffs into the lungs 2 (two) times daily. Before lunch 12/21/20  Yes Byrum, Rose Fillers, MD  Budeson-Glycopyrrol-Formoterol (BREZTRI AEROSPHERE) 160-9-4.8 MCG/ACT AERO Inhale 2 puffs into the lungs in the morning and at bedtime. 02/06/21  Yes Parrett, Tammy S, NP  busPIRone (BUSPAR) 10 MG tablet Take 10 mg by mouth 3 (three) times daily.   Yes [provider]  cefTRIAXone (ROCEPHIN) 1 g injection ceftriaxone 1 gram solution for injection  Take 1 g every day by injection route as directed for 1 day.   Yes [provider]  docusate sodium (COLACE) 100 MG capsule Take 1 capsule (100 mg total) by mouth 2 (two) times daily. Patient taking differently: Take 100 mg by mouth 2 (two) times daily as needed (constipation). 05/02/19  Yes Arlan Organ, DO  doxycycline (VIBRAMYCIN) 100 MG capsule Take 100 mg by mouth 2 (two) times daily. For 7 days started on 09/23/21   Yes [provider]  fluticasone (FLONASE) 50 MCG/ACT nasal spray Place 1 spray into both nostrils daily as needed for allergies.   Yes [provider]  ibuprofen (ADVIL) 200 MG tablet Take 400 mg by mouth every 8 (eight) hours as needed (pain.).   Yes [provider]  ipratropium (ATROVENT HFA) 17 MCG/ACT inhaler Inhale 2 puffs into the lungs every 4 (four) hours as needed for wheezing.   Yes [provider]  loratadine (CLARITIN) 10 MG tablet Take 10 mg by mouth daily as needed for allergies.   Yes [provider]  Magnesium 250 MG TABS Take 500  mg by mouth in the morning.   Yes [provider]  montelukast (SINGULAIR) 10 MG tablet Take 10 mg by mouth in the morning.   Yes [provider]  omeprazole (PRILOSEC) 20 MG capsule Take 20 mg by mouth daily.   Yes [provider]  predniSONE (STERAPRED UNI-PAK 21 TAB) 10 MG (21) TBPK tablet Take 10 mg by mouth See admin instructions. [id number] 09/22/21  Yes [provider]  revefenacin  (YUPELRI) 175 MCG/3ML nebulizer solution Take 3 mLs (175 mcg total) by nebulization daily. 02/06/21  Yes Collene Gobble, MD  tretinoin (RETIN-A) 0.1 % cream Apply topically at bedtime. 08/17/21  Yes [provider]  triamcinolone acetonide (TRIESENCE) 40 MG/ML SUSP triamcinolone acetonide 40 mg/mL suspension for injection  Take 80 mg every day by injection route for 1 day.   Yes [provider]  arformoterol (BROVANA) 15 MCG/2ML NEBU Take 2 mLs (15 mcg total) by nebulization 2 (two) times daily. Patient not taking: Reported on 09/24/2021 02/06/21   Collene Gobble, MD  rosuvastatin (CRESTOR) 5 MG tablet Take 1 tablet (5 mg total) by mouth daily. Patient not taking: Reported on 09/24/2021 01/22/21 09/13/23  Fay Records, MD  SPIRIVA HANDIHALER 18 MCG inhalation capsule INHALE 1 CAPSULE VIA HANDIHALER ONCE DAILY AT [place] DAY Patient not taking: Reported on 09/24/2021 07/17/20   Parrett, Fonnie Mu, NP     Family History  Problem Relation Age of Onset   Atrial fibrillation Sister     Social History   Socioeconomic History   Marital status: Married    Spouse name: Not on file   Number of children: Not on file   Years of education: Not on file   Highest education level: Not on file  Occupational History   Not on file  Tobacco Use   Smoking status: Former    Packs/day: 2.00    Years: 45.00    Pack years: 90.00    Types: Cigarettes    Quit date: 04/11/2019    Years since quitting: 2.5   Smokeless tobacco: Never  Vaping Use   Vaping Use: Never used  Substance and Sexual Activity   Alcohol use: No   Drug use: Never   Sexual activity: Yes  Other Topics Concern   Not on file  Social History Narrative   Not on file   Social Determinants of Health   Financial Resource Strain: Not on file  Food Insecurity: Not on file  Transportation Needs: Not on file  Physical Activity: Not on file  Stress: Not on file  Social Connections: Not on file    Review of  Systems: A 12 point ROS discussed and pertinent positives are indicated in the HPI above.  All other systems are negative.  Review of Systems  Constitutional:  Positive for appetite change and fatigue.  Respiratory:  Negative for cough and shortness of breath.   Gastrointestinal:  Positive for abdominal pain, nausea and vomiting.   Vital Signs: BP 119/65 (BP Location: Left Arm)   Pulse 94   Temp 98.2 F (36.8 C)   Resp 20   Ht 5\' 1"  (1.549 m)   Wt 133 lb 13.1 oz (60.7 kg)   SpO2 100%   BMI 25.28 kg/m   Physical Exam Constitutional:      General: She is not in acute distress.    Appearance: She is underweight. She is ill-appearing.  HENT:     Nose:     Comments: Cortrak.  Cardiovascular:     Rate and Rhythm: Normal rate and regular rhythm.  Pulmonary:     Effort: Pulmonary effort is normal.     Comments: Tracheostomy/trach collar. Currently receiving nebulizer treatment.  Abdominal:     Tenderness: There is abdominal tenderness.  Neurological:     Mental Status: She is alert and oriented to person, place, and time.     Comments: Able to nod/shake her head appropriately.     Imaging: CT ABDOMEN WO CONTRAST  Result Date: 10/27/2021 CLINICAL DATA:  Evaluate anatomy for G-tube placement EXAM: CT ABDOMEN WITHOUT CONTRAST TECHNIQUE: Multidetector CT imaging of the abdomen was performed following the standard protocol without IV contrast. RADIATION DOSE REDUCTION: This exam was performed according to the departmental dose-optimization program which includes automated exposure control, adjustment of the mA and/or kV according to patient size and/or use of iterative reconstruction technique. COMPARISON:  05/15/2019 11/22/2020 FINDINGS: Lower chest: Emphysematous changes noted in the lung bases. Ground-glass opacities in the right middle lobe likely due to localized inflammation. New cluster of pulmonary nodules noted in the right lower lobe (5/5) with the largest measuring 9 mm.  Hepatobiliary: Right hepatic lobe calcification is most likely related to prior granulomatous inflammation. Liver and gallbladder otherwise unremarkable. Pancreas: Unremarkable. No pancreatic ductal dilatation or surrounding inflammatory changes. Spleen: Normal in size without focal abnormality. Adrenals/Urinary Tract: Adrenal glands are normal. Minimal bilateral hydronephrosis and right hydroureter is new since prior examination. Stomach/Bowel: Feeding tube terminates in the proximal duodenum. Gastric anatomy is amenable to percutaneous gastrostomy tube placement. No dilated loop of bowel identified within the visualized abdomen. Vascular/Lymphatic: Atherosclerotic calcifications present throughout the visualized aorta. Other: No abdominal wall hernia or abnormality. Musculoskeletal: Disc spacer noted in L4-L5. No acute osseous abnormality. IMPRESSION: 1. Gastric anatomy is amenable to G-tube placement. 2. New cluster of pulmonary nodules present in the right lower lobe (image 5, series 5) with the largest measuring 9 mm. Dedicated contrast enhanced chest CT should be obtained. 3. Advanced emphysema. Electronically Signed   By: Miachel Roux M.D.   On: 10/27/2021 12:20   CT HEAD WO CONTRAST (5MM)  Result Date: 10/01/2021 CLINICAL DATA:  Altered mental status, nontraumatic EXAM: CT HEAD WITHOUT CONTRAST TECHNIQUE: Contiguous axial images were obtained from the base of the skull through the vertex without intravenous contrast. RADIATION DOSE REDUCTION: This exam was performed according to the departmental dose-optimization program which includes automated exposure control, adjustment of the mA and/or kV according to patient size and/or use of iterative reconstruction technique. COMPARISON:  None. FINDINGS: Brain: There is no acute intracranial hemorrhage, mass effect, or edema. Gray-white differentiation is preserved. There is no extra-axial fluid collection. Ventricles and sulci are within normal limits in size  and configuration. Vascular: There is atherosclerotic calcification at the skull base. Skull: Calvarium is unremarkable. Sinuses/Orbits: Patchy mucosal thickening.  Orbits are unremarkable. Other: None. IMPRESSION: No acute intracranial abnormality. Electronically Signed   By: Macy Mis M.D.   On: 10/01/2021 16:13   CT CHEST W CONTRAST  Result Date: 10/28/2021 CLINICAL DATA:  66 year old female with lung nodules on recent abdominal CT. Shortness of breath. EXAM: CT CHEST WITH CONTRAST TECHNIQUE: Multidetector CT imaging of the chest was performed during intravenous contrast administration. RADIATION DOSE REDUCTION: This exam was performed according to the departmental dose-optimization program which includes automated exposure control, adjustment of the mA and/or kV according to patient size and/or use of iterative reconstruction technique. CONTRAST:  52mL OMNIPAQUE IOHEXOL 350 MG/ML SOLN COMPARISON:  10/27/2021 abdominal CT,  11/22/2020 chest CT and prior studies FINDINGS: Cardiovascular: Heart size is normal. Aortic atherosclerotic calcifications are noted. There is no evidence of thoracic aortic aneurysm or pericardial effusion. Mediastinum/Nodes: No enlarged mediastinal, hilar, or axillary lymph nodes. Tracheostomy tube noted and NG tube extending into the stomach identified. Lungs/Pleura: There are new scattered nodular, patchy and focal consolidative opacities within both lungs, some which appear low-density. These areas involve primarily the UPPER lobes, LEFT-greater-than-RIGHT. Index areas include a 11 mm RIGHT UPPER lobe nodular opacity (image 45: Series 4) and a 3.5 x 8 cm more confluent streaky opacity within the MEDIAL LEFT UPPER lobe (79:4). Other smaller areas of tree-in-bud opacities are noted along the periphery of the mid and LOWER lungs. Emphysema and biapical pleuroparenchymal scarring again identified. No pleural effusion or pneumothorax. Upper Abdomen: No acute abnormality.  Musculoskeletal: No acute or suspicious bony abnormalities are noted. IMPRESSION: 1. New scattered bilateral nodular, patchy and focal consolidative opacities within both UPPER lobes and other smaller areas of tree-in-bud opacities along the periphery of the mid and LOWER lungs. These findings are nonspecific but most likely represent infection. CT follow-up in 3 months is recommended following acute clinical episode. 2. Aortic Atherosclerosis (ICD10-I70.0) and Emphysema (ICD10-J43.9). Electronically Signed   By: Margarette Canada M.D.   On: 10/28/2021 14:42   DG CHEST PORT 1 VIEW  Result Date: 10/28/2021 CLINICAL DATA:  Inpatient encounter for hypoxemia. EXAM: PORTABLE CHEST 1 VIEW COMPARISON:  Portable chest 10/24/2021, chest CT with contrast earlier today FINDINGS: 8:42 p.m., 10/28/2021. Tracheostomy cannula tip is 6.3 cm from the carina. The cardiac size is normal. Feeding tube is well inside the stomach but the radiopaque tip is not included in the exam. The mediastinum is normally outlined. There is again noted patchy and coarsely nodular infiltrate in the medial perihilar/suprahilar left upper lobe, peripheral right upper lobe mid field and lateral right middle lobe. Rest of the lungs emphysematous and otherwise clear. No new or worsened infiltrate is seen. No pleural effusion is seen.  No acute osseous abnormality. IMPRESSION: Multifocal infiltrates consistent with multilobar pneumonia. Background COPD. Overall aeration seems unchanged since today's earlier chest CT but with worsened opacities in these areas compared with 10/24/2021. Electronically Signed   By: Telford Nab M.D.   On: 10/28/2021 20:55   DG CHEST PORT 1 VIEW  Result Date: 10/24/2021 CLINICAL DATA:  Shortness of breath. EXAM: PORTABLE CHEST 1 VIEW COMPARISON:  Portable chest 10/08/2021. FINDINGS: There is a tracheostomy cannula again noted with tip 5.6 cm from the carina. Feeding tube is in place with the intragastric course not fully  evaluated. The lungs are emphysematous. There is increased patchy hazy opacity in the right mid field which is most likely either in the base of the upper lobe or in the superior segment of the lower lobe. Findings most likely due to pneumonitis. There is additional small haziness in the peripheral left mid field which could be additional pneumonitis or overlying artifact. Remainder of the lungs clear. The cardiomediastinal silhouette is unremarkable. There is aortic atherosclerosis. No significant pleural effusion. Osteopenia. IMPRESSION: 1. Interval new patchy hazy opacity right mid lung periphery, lesser new opacity peripheral left mid lung. 2. Findings most likely due to multilobar pneumonia. Background COPD. 3. Clinical correlation and radiographic follow-up recommended. Electronically Signed   By: Telford Nab M.D.   On: 10/24/2021 03:14   DG Chest Port 1 View  Result Date: 10/08/2021 CLINICAL DATA:  Post tracheostomy EXAM: PORTABLE CHEST 1 VIEW COMPARISON:  Chest radiograph 10/07/2021  FINDINGS: There is a tracheostomy tube in place with tip approximately 4.5 cm from the carina. The enteric catheter tip is off the field of view. The cardiomediastinal silhouette is stable. Aeration of the lungs is unchanged, with no new or worsening focal airspace disease. The lungs remain hyperinflated consistent with underlying COPD. There is no pleural effusion or pneumothorax. There is no acute osseous abnormality. IMPRESSION: 1. New tracheostomy tube in place with tip approximately 4.5 cm from the carina. 2. Unchanged aeration of the lungs since 10/07/2021. Electronically Signed   By: Valetta Mole M.D.   On: 10/08/2021 13:59   DG Chest Port 1 View  Result Date: 10/07/2021 CLINICAL DATA:  Acute respiratory failure in a 66 year old female. EXAM: PORTABLE CHEST 1 VIEW COMPARISON:  Comparison made with October 03, 2021. FINDINGS: Endotracheal tube terminates approximately 2.7 cm from the carina similar to prior imaging.  Feeding tube courses through in off the field of the radiograph, terminating below the diaphragm. EKG leads project over the patient's chest. Image rotated to the RIGHT. Accounting for this cardiomediastinal contours and hilar structures are stable. Signs of pulmonary emphysema are noted. No lobar consolidation. Lungs are hyperinflated. No gross effusion. On limited assessment there is no acute skeletal process. IMPRESSION: 1. No interval change in the appearance of the chest. 2. COPD. Electronically Signed   By: Zetta Bills M.D.   On: 10/07/2021 09:14   DG Chest Port 1 View  Result Date: 10/03/2021 CLINICAL DATA:  Respiratory failure. EXAM: PORTABLE CHEST 1 VIEW COMPARISON:  Radiograph October 01, 2021 FINDINGS: Endotracheal tube with tip projecting over the midthoracic trachea. Enteric tube coursing below the diaphragm with tip and side port excluded by collimation. The heart size and mediastinal contours are unchanged. Similar chronic bilateral interstitial thickening at the lung bases with slightly increased patchy bibasilar airspace opacities. No visible pleural effusion or pneumothorax. No acute osseous abnormality IMPRESSION: Similar chronic bilateral interstitial thickening at the lung bases with slightly increased patchy bibasilar airspace opacities, possibly reflecting atelectasis or infection/aspiration. Electronically Signed   By: Dahlia Bailiff M.D.   On: 10/03/2021 07:56   DG CHEST PORT 1 VIEW  Result Date: 10/01/2021 CLINICAL DATA:  Shortness of breath EXAM: PORTABLE CHEST 1 VIEW COMPARISON:  09/28/2021 FINDINGS: Endotracheal tube with the tip 2.4 cm above the carina. Lungs are hyperinflated as can be seen with COPD. Bilateral chronic interstitial thickening at the lung bases. No focal consolidation, pleural effusion or pneumothorax. Nasogastric tube with the tip projecting over the stomach, but the proximal port is above the insert apical gastric junction; recommend advancing the nasogastric  tube 10 cm. No acute osseous abnormality. IMPRESSION: 1. Endotracheal tube with the tip 2.4 cm above the carina. 2. Nasogastric tube with the tip projecting over the stomach, but the proximal port is above the insert apical gastric junction; recommend advancing the nasogastric tube 10 cm. 3. COPD. Electronically Signed   By: Kathreen Devoid M.D.   On: 10/01/2021 09:03   DG Abd Portable 1V  Result Date: 10/30/2021 CLINICAL DATA:  abdominal distention EXAM: Portable AP supine view of the abdomen COMPARISON:  Abdominal x-ray from Oct 21, 2021 FINDINGS: Again seen is the feeding tube with its tip at the gastric pylorus. Mild gaseous distention of the small bowel loops, with air distended small bowel loop measuring 4.2 cm in greatest transverse diameter at the right lower abdomen and has increased in the interim. There is some air seen in the large bowel loops as well. No radio-opaque  calculi or other significant radiographic abnormality are seen. IMPRESSION: Tip of the enteric tube terminates at the pylorus. Mild gaseous distention of the small bowel loops on the basis of likely partial small bowel obstruction and has increased in the interim. Electronically Signed   By: Marjo Bicker M.D.   On: 10/30/2021 11:19   DG Abd Portable 1V  Result Date: 10/21/2021 CLINICAL DATA:  Abdominal distension, enteric catheter EXAM: PORTABLE ABDOMEN - 1 VIEW COMPARISON:  09/27/2021 FINDINGS: Frontal view of the lower chest and upper abdomen demonstrates enteric catheter passing below diaphragm tip overlying the gastric antrum. Bowel gas pattern is grossly unremarkable. Chronic scarring throughout the lungs. IMPRESSION: 1. Enteric catheter tip projecting over the gastric antrum. Electronically Signed   By: Sharlet Salina M.D.   On: 10/21/2021 17:48   DG Abd Portable 1V  Result Date: 10/05/2021 CLINICAL DATA:  Feeding tube placement. EXAM: PORTABLE ABDOMEN - 1 VIEW COMPARISON:  None. FINDINGS: Enteric tube appears adequately  positioned in the stomach with tip directed towards the pylorus/duodenal bulb. Coarse lung markings are seen at each lung base, suggesting chronic interstitial lung disease/emphysema. IMPRESSION: Enteric tube adequately positioned in the stomach with tip directed towards the pylorus/duodenal bulb. Electronically Signed   By: Bary Richard M.D.   On: 10/05/2021 10:04    Labs:  CBC: Recent Labs    10/24/21 0425 10/25/21 0155 10/27/21 0432 10/30/21 0309  WBC 15.0* 15.8* 14.6* 21.0*  HGB 8.9* 9.3* 9.2* 9.4*  HCT 28.0* 29.0* 29.6* 29.6*  PLT 310 250 314 327    COAGS: Recent Labs    10/24/21 0425  INR 1.0  APTT 21*    BMP: Recent Labs    10/24/21 0425 10/25/21 0155 10/27/21 0432 10/30/21 0309  NA 136 139 139 136  K 3.8 3.8 3.8 3.9  CL 92* 110 98 96*  CO2 35* 22 34* 32  GLUCOSE 165* 126* 141* 134*  BUN 24* 13 18 19   CALCIUM 8.7* 8.5* 8.6* 8.5*  CREATININE 0.37* 0.85 <0.30* <0.30*  GFRNONAA >60 >60 NOT CALCULATED NOT CALCULATED    LIVER FUNCTION TESTS: Recent Labs    10/16/21 0327 10/24/21 0425 10/25/21 0155 10/27/21 0432  BILITOT 0.7 0.5 3.2* 0.4  AST 39 71* 74* 38  ALT 103* 156* 60* 86*  ALKPHOS 114 272* 203* 200*  PROT 6.8 6.9 5.4* 6.3*  ALBUMIN 2.1* 2.2* 3.0* 1.9*    TUMOR MARKERS: No results for input(s): AFPTM, CEA, CA199, CHROMGRNA in the last 8760 hours.  Assessment and Plan:  Acute respiratory failure; tracheostomy; severe malnutrition/dysphagia.   [name]. Morello, 66 year old female, is tentatively scheduled for gastrostomy tube placement in IR 11/02/21. Her last dose of aspirin was 10/28/21. Her last dose of Lovenox will be 10/31/21. Patient currently has a WBC of 21 with abdominal pain and a CT abdomen/pelvis with findings concerning for partial SBO.    The procedure was discussed at the bedside with the patient and also on the phone with her sister Rollene Fare. Telephone consent was obtained from [place]. They are both aware the procedure may need to be  delayed until the patient is more stable.   Risks and benefits image guided gastrostomy tube placement was discussed with the patient including, but not limited to the need for a barium enema during the procedure, bleeding, infection, peritonitis and/or damage to adjacent structures.  All of the patient's questions were answered, patient is agreeable to proceed.  Consent signed and in IR. Procedure orders will need to be placed  the night prior to the procedure.   Thank you for this interesting consult.  I greatly enjoyed meeting SAROJ STANARD and look forward to participating in their care.  A copy of this report was sent to the requesting provider on this date.  Electronically Signed: Soyla Dryer, AGACNP-BC [phone] 10/30/2021, 12:27 PM   I spent a total of 20 Minutes    in face to face in clinical consultation, greater than 50% of which was counseling/coordinating care for gastrostomy tube

## 2021-10-30 NOTE — Progress Notes (Signed)
PT Cancellation Note  Patient Details Name: [name] MRN: [id number] DOB: [birth year]   Cancelled Treatment:    Reason Eval/Treat Not Completed: (P) Medical issues which prohibited therapy (Per RN, pt with new onset n/v and scheduled for CT scan. Requesting PTA hold tx session until tomorrow.) Will continue efforts per PT plan of care as schedule permits.   Dorathy Kinsman Sheryle Vice 10/30/2021, 1:41 PM

## 2021-10-30 NOTE — Progress Notes (Signed)
RT NOTE: CPT done via chest vest for 10 minutes and patient suctioned with small amount of secretions obtained. RT placed tube feeds on hold prior to performing CPT. After suctioning patient felt nauseous and subsequently vomited large amount. RT suctioned with yankeur and called RN to bedside to notify of event. RT cleaned patient's trach, changing trach ties and applying new mepilex and drain gauze. RN notifying MD and keeping tube feeds on hold. Vitals are stable. RT will continue to monitor.

## 2021-10-30 NOTE — Progress Notes (Signed)
OT Cancellation Note  Patient Details Name: BLOSSOM FAMOUS MRN: [id number] DOB: 1955/10/24   Cancelled Treatment:    Reason Eval/Treat Not Completed: Medical issues which prohibited therapy (Vomiting and nauseous. RN stopped feeds. Request hold therapy. Will return as schedule allows)  [place], OTR/L Acute Rehab Pager: [phone] Office: [phone] 10/30/2021, 12:31 PM

## 2021-10-30 NOTE — Progress Notes (Signed)
PROGRESS NOTE    [name]  [id number] DOB: 02-May-1956 DOA: 09/24/2021 PCP: Everardo Beals, NP   Brief Narrative:  [name] is a 66 y.o. female with PMH significant for GOLD 3 COPD, quit smoking 2021, uncontrolled anxiety. Patient presented to ED on 4/17 with complaint of difficulty breathing, cough, wheezing, fatigue with symptoms worsening for 1 week. EMS noted hypoxia. Patient required BiPAP in the ED and was admitted to hospitalist service, however her respiratory status continued to worsen. On 4/19, she was intubated and transferred to ICU. She was treated for pneumonia and was found to have  newly diagnosed acute systolic heart failure. Patient was unable to wean off the ventilator successfully and underwent tracheostomy placement on 5/1. She was transferred out of the ICU on 5/15. She is currently receiving tube feeds via NG tube with plans for PEG tube placement.   Assessment and Plan:  Acute respiratory failure with hypoxia and hypercapnia Tracheostomy/vent dependent respiratory failure Patient admitted initially with COPD exacerbation and managed on BiPAP. Worsening respiratory failure lead to [facility] consult and requirement for mechanical ventilation. Patient intubated on 4/19. Prolonged ventilator wean and so tracheostomy performed on 5/1. Patient having trouble controlling her secretions. Supplemental oxygen increased to 10 L/min -Continue trach care per PCCM -Continue chest physiotherapy and hypertonic saline  Abdominal distension New issue. Patient with decreased flatus per her report in addition to infrequent bowel movements, even on tube feeds. Abdominal x-ray ordered 5/23 which is concerning for partial SBO. Associated abdominal pain. -CT abdomen/pelvis  Leukocytosis Unsure if etiology at this time but recent CT chest significant for multiple nodules concerning for infection. Recently completed antibiotics. -Daily CBC  Pulmonary nodules Concerning for  infection. Most recent blood cultures (5/17) with no growth. Procalcitonin obtained today and is undetectable. Afebrile. -Procalcitonin -Repeat blood cultures -Follow-up CT chest w/contrast in 3 months  Hospital acquired pneumonia, resolved. Tracheal aspirate (4/27) significant for klebsiella pneumoniae infection. Patient completed 6 days of antibiotic therapy with Ceftriaxone/Cefepime.  COPD exacerbation Initial problem which led to acute respiratory failure and mechanical ventilation. Patient treated with Steroids and nebulizer treatments. Exacerbation resolved. -Continue Brovana, Pulmicort scheduled and Xopenex PRN  Acute systolic heart failure LVEF of 40% on recent Transthoracic Echocardiogram with focal WMA. Cardiology consulted with recommendation for outpatient ischemic evaluation.  Acute metabolic encephalopathy Resolved. Managed with Seroquel which is being decreased.  Severe anxiety Continue Klonopin and Seroquel  Thrombocytopenia Transient in setting of acute illness. Improved.  Severe malnutrition Cortrak feeding tube placed on 4/28. Speech therapy on board. Unable to wean off tube feeds, so plan is for placement of a PEG tube. Patient advanced to a regular diet on 5/11. Tube feeds transitioned to overnight. -NPO since she is vomiting at this time; holding tube feeds  Hyperlipidemia -Continue Crestor  Hyperglycemia Hemoglobin A1C of 5.4%  Sepsis Not present on admission. Secondary to recurrent pneumonia. No bacteremia. Now resolved.   DVT prophylaxis: Lovenox Code Status:   Code Status: Full Code Family Communication: None at bedside Disposition Plan: Discharge hopefully to [facility] when bed is available   Consultants:  PCCM Cardiology  Procedures:  Transthoracic Echocardiogram (09/27/21)  Antimicrobials: Ceftriaxone Cefepime    Subjective: Patient not feeling too well. No significant issues overnight. Not eating well. Patient reports decreased flatus  and no recent bowel movement.  Objective: BP 119/65 (BP Location: Left Arm)   Pulse 94   Temp 98.2 F (36.8 C)   Resp 20   Ht 5\' 1"  (1.549 m)  Wt 60.7 kg   SpO2 100%   BMI 25.28 kg/m   Examination:  General exam: Appears calm and comfortable. Chronically ill appearing. Respiratory system: Diminished. Respiratory effort normal. Cardiovascular system: S1 & S2 heard, RRR. Gastrointestinal system: Abdomen is distended, soft and nontender. Normal bowel sounds heard. Central nervous system: Alert and oriented. No focal neurological deficits. Musculoskeletal: No edema. No calf tenderness Skin: No cyanosis. No rashes Psychiatry: Judgement and insight appear normal. Depressed appearing mood.    Data Reviewed: I have personally reviewed following labs and imaging studies  CBC Lab Results  Component Value Date   WBC 21.0 (H) 10/30/2021   RBC 2.93 (L) 10/30/2021   HGB 9.4 (L) 10/30/2021   HCT 29.6 (L) 10/30/2021   MCV 101.0 (H) 10/30/2021   MCH 32.1 10/30/2021   PLT 327 10/30/2021   MCHC 31.8 10/30/2021   RDW 15.2 10/30/2021   LYMPHSABS 2.1 10/30/2021   MONOABS 1.5 (H) 10/30/2021   EOSABS 0.0 10/30/2021   BASOSABS 0.1 [id number]     Last metabolic panel Lab Results  Component Value Date   NA 136 10/30/2021   K 3.9 10/30/2021   CL 96 (L) 10/30/2021   CO2 32 10/30/2021   BUN 19 10/30/2021   CREATININE <0.30 (L) 10/30/2021   GLUCOSE 134 (H) 10/30/2021   GFRNONAA NOT CALCULATED 10/30/2021   GFRAA >60 05/14/2019   CALCIUM 8.5 (L) 10/30/2021   PHOS 4.0 10/25/2021   PROT 6.3 (L) 10/27/2021   ALBUMIN 1.9 (L) 10/27/2021   BILITOT 0.4 10/27/2021   ALKPHOS 200 (H) 10/27/2021   AST 38 10/27/2021   ALT 86 (H) 10/27/2021   ANIONGAP 8 10/30/2021    GFR: CrCl cannot be calculated (This lab value cannot be used to calculate CrCl because it is not a number: <0.30).  Recent Results (from the past 240 hour(s))  Culture, blood (x 2)     Status: None   Collection Time:  10/24/21  4:25 AM   Specimen: BLOOD LEFT HAND  Result Value Ref Range Status   Specimen Description BLOOD LEFT HAND  Final   Special Requests   Final    BOTTLES DRAWN AEROBIC ONLY Blood Culture adequate volume   Culture   Final    NO GROWTH 5 DAYS Performed at [facility], [address], [place], [place] [address]    Report Status 10/29/2021 FINAL  Final  Culture, blood (x 2)     Status: None   Collection Time: 10/24/21  4:25 AM   Specimen: BLOOD LEFT ARM  Result Value Ref Range Status   Specimen Description BLOOD LEFT ARM  Final   Special Requests   Final    BOTTLES DRAWN AEROBIC ONLY Blood Culture adequate volume   Culture   Final    NO GROWTH 5 DAYS Performed at [facility], [place] [address] [place], [place] [address]    Report Status 10/29/2021 FINAL  Final  MRSA Next Gen by PCR, Nasal     Status: None   Collection Time: 10/24/21  5:05 AM  Result Value Ref Range Status   MRSA by PCR Next Gen NOT DETECTED NOT DETECTED Final    Comment: (NOTE) The GeneXpert MRSA Assay (FDA approved for NASAL specimens only), is one component of a comprehensive MRSA colonization surveillance program. It is not intended to diagnose MRSA infection nor to guide or monitor treatment for MRSA infections. Test performance is not FDA approved in patients less than 44 years old. Performed at  [facility], [place] [address] [place], [place] [address]       Radiology Studies: CT CHEST W CONTRAST  Result Date: 10/28/2021 CLINICAL DATA:  66 year old female with lung nodules on recent abdominal CT. Shortness of breath. EXAM: CT CHEST WITH CONTRAST TECHNIQUE: Multidetector CT imaging of the chest was performed during intravenous contrast administration. RADIATION DOSE REDUCTION: This exam was performed according to the departmental dose-optimization program which includes automated exposure control, adjustment of the mA and/or kV according to patient size and/or use of iterative  reconstruction technique. CONTRAST:  42mL OMNIPAQUE IOHEXOL 350 MG/ML SOLN COMPARISON:  10/27/2021 abdominal CT, 11/22/2020 chest CT and prior studies FINDINGS: Cardiovascular: Heart size is normal. Aortic atherosclerotic calcifications are noted. There is no evidence of thoracic aortic aneurysm or pericardial effusion. Mediastinum/Nodes: No enlarged mediastinal, hilar, or axillary lymph nodes. Tracheostomy tube noted and NG tube extending into the stomach identified. Lungs/Pleura: There are new scattered nodular, patchy and focal consolidative opacities within both lungs, some which appear low-density. These areas involve primarily the UPPER lobes, LEFT-greater-than-RIGHT. Index areas include a 11 mm RIGHT UPPER lobe nodular opacity (image 45: Series 4) and a 3.5 x 8 cm more confluent streaky opacity within the MEDIAL LEFT UPPER lobe (79:4). Other smaller areas of tree-in-bud opacities are noted along the periphery of the mid and LOWER lungs. Emphysema and biapical pleuroparenchymal scarring again identified. No pleural effusion or pneumothorax. Upper Abdomen: No acute abnormality. Musculoskeletal: No acute or suspicious bony abnormalities are noted. IMPRESSION: 1. New scattered bilateral nodular, patchy and focal consolidative opacities within both UPPER lobes and other smaller areas of tree-in-bud opacities along the periphery of the mid and LOWER lungs. These findings are nonspecific but most likely represent infection. CT follow-up in 3 months is recommended following acute clinical episode. 2. Aortic Atherosclerosis (ICD10-I70.0) and Emphysema (ICD10-J43.9). Electronically Signed   By: Margarette Canada M.D.   On: 10/28/2021 14:42   DG CHEST PORT 1 VIEW  Result Date: 10/28/2021 CLINICAL DATA:  Inpatient encounter for hypoxemia. EXAM: PORTABLE CHEST 1 VIEW COMPARISON:  Portable chest 10/24/2021, chest CT with contrast earlier today FINDINGS: 8:42 p.m., 10/28/2021. Tracheostomy cannula tip is 6.3 cm from the  carina. The cardiac size is normal. Feeding tube is well inside the stomach but the radiopaque tip is not included in the exam. The mediastinum is normally outlined. There is again noted patchy and coarsely nodular infiltrate in the medial perihilar/suprahilar left upper lobe, peripheral right upper lobe mid field and lateral right middle lobe. Rest of the lungs emphysematous and otherwise clear. No new or worsened infiltrate is seen. No pleural effusion is seen.  No acute osseous abnormality. IMPRESSION: Multifocal infiltrates consistent with multilobar pneumonia. Background COPD. Overall aeration seems unchanged since today's earlier chest CT but with worsened opacities in these areas compared with 10/24/2021. Electronically Signed   By: Telford Nab M.D.   On: 10/28/2021 20:55   DG Abd Portable 1V  Result Date: 10/30/2021 CLINICAL DATA:  abdominal distention EXAM: Portable AP supine view of the abdomen COMPARISON:  Abdominal x-ray from Oct 21, 2021 FINDINGS: Again seen is the feeding tube with its tip at the gastric pylorus. Mild gaseous distention of the small bowel loops, with air distended small bowel loop measuring 4.2 cm in greatest transverse diameter at the right lower abdomen and has increased in the interim. There is some air seen in the large bowel loops as well. No radio-opaque calculi or other significant radiographic abnormality are seen. IMPRESSION: Tip of  the enteric tube terminates at the pylorus. Mild gaseous distention of the small bowel loops on the basis of likely partial small bowel obstruction and has increased in the interim. Electronically Signed   By: Frazier Richards M.D.   On: 10/30/2021 11:19      LOS: 35 days    Cordelia Poche, MD [facility] 10/30/2021, 12:04 PM   If 7PM-7AM, please contact night-coverage [email or web address]

## 2021-10-30 NOTE — Progress Notes (Signed)
This chaplain to RN-Candace page for spiritual care. The chaplain understands the Pt. and Pt. Sister-Roslyn are ready for notarizing of the Pt. Advance Directive. The chaplain informed the RN notary services are not available today.  The chaplain understands Rollene Fare will be present at the bedside on Wednesday between 11-12.  This chaplain will communicate the request to the unit chaplain.   Chaplain Stephanie Acre [phone]

## 2021-10-31 ENCOUNTER — Inpatient Hospital Stay (HOSPITAL_COMMUNITY): Payer: Medicare Other

## 2021-10-31 DIAGNOSIS — J9601 Acute respiratory failure with hypoxia: Secondary | ICD-10-CM | POA: Diagnosis not present

## 2021-10-31 DIAGNOSIS — G9340 Encephalopathy, unspecified: Secondary | ICD-10-CM | POA: Diagnosis not present

## 2021-10-31 DIAGNOSIS — I5022 Chronic systolic (congestive) heart failure: Secondary | ICD-10-CM | POA: Diagnosis not present

## 2021-10-31 DIAGNOSIS — R739 Hyperglycemia, unspecified: Secondary | ICD-10-CM | POA: Diagnosis not present

## 2021-10-31 LAB — GLUCOSE, CAPILLARY
Glucose-Capillary: 100 mg/dL — ABNORMAL HIGH (ref 70–99)
Glucose-Capillary: 102 mg/dL — ABNORMAL HIGH (ref 70–99)
Glucose-Capillary: 80 mg/dL (ref 70–99)
Glucose-Capillary: 88 mg/dL (ref 70–99)
Glucose-Capillary: 92 mg/dL (ref 70–99)
Glucose-Capillary: 97 mg/dL (ref 70–99)

## 2021-10-31 LAB — BASIC METABOLIC PANEL
Anion gap: 8 (ref 5–15)
BUN: 18 mg/dL (ref 8–23)
CO2: 34 mmol/L — ABNORMAL HIGH (ref 22–32)
Calcium: 9 mg/dL (ref 8.9–10.3)
Chloride: 98 mmol/L (ref 98–111)
Creatinine, Ser: 0.32 mg/dL — ABNORMAL LOW (ref 0.44–1.00)
GFR, Estimated: >60 mL/min (ref >60)
Glucose, Bld: 103 mg/dL — ABNORMAL HIGH (ref 70–99)
Potassium: 4 mmol/L (ref 3.5–5.1)
Sodium: 140 mmol/L (ref 135–145)

## 2021-10-31 LAB — CBC
HCT: 30.1 % — ABNORMAL LOW (ref 36.0–46.0)
Hemoglobin: 9.4 g/dL — ABNORMAL LOW (ref 12.0–15.0)
MCH: 32 pg (ref 26.0–34.0)
MCHC: 31.2 g/dL (ref 30.0–36.0)
MCV: 102.4 fL — ABNORMAL HIGH (ref 80.0–100.0)
Platelets: 335 10*3/uL (ref 150–400)
RBC: 2.94 MIL/uL — ABNORMAL LOW (ref 3.87–5.11)
RDW: 15.8 % — ABNORMAL HIGH (ref 11.5–15.5)
WBC: 20.1 10*3/uL — ABNORMAL HIGH (ref 4.0–10.5)
nRBC: 0 % (ref 0.0–0.2)

## 2021-10-31 LAB — PROCALCITONIN: Procalcitonin: <0.1 ng/mL

## 2021-10-31 MED ORDER — SODIUM CHLORIDE 3 % IN NEBU
4.0000 mL | INHALATION_SOLUTION | Freq: Two times a day (BID) | RESPIRATORY_TRACT | Status: DC
  Administered 2021-10-31 – 2021-11-08 (×17): 4 mL via RESPIRATORY_TRACT
  Filled 2021-10-31 (×18): qty 4

## 2021-10-31 MED ORDER — BISACODYL 10 MG RE SUPP
10.0000 mg | Freq: Once | RECTAL | Status: AC
  Administered 2021-10-31: 10 mg via RECTAL
  Filled 2021-10-31: qty 1

## 2021-10-31 MED ORDER — ALBUTEROL SULFATE (2.5 MG/3ML) 0.083% IN NEBU
2.5000 mg | INHALATION_SOLUTION | Freq: Three times a day (TID) | RESPIRATORY_TRACT | Status: DC
  Administered 2021-10-31 – 2021-11-04 (×11): 2.5 mg via RESPIRATORY_TRACT
  Filled 2021-10-31 (×12): qty 3

## 2021-10-31 MED ORDER — DOCUSATE SODIUM 100 MG PO CAPS
100.0000 mg | ORAL_CAPSULE | Freq: Two times a day (BID) | ORAL | Status: DC
Start: 2021-10-31 — End: 2021-11-14
  Administered 2021-10-31 – 2021-11-14 (×24): 100 mg via ORAL
  Filled 2021-10-31 (×27): qty 1

## 2021-10-31 NOTE — Progress Notes (Signed)
Nutrition Follow-up  DOCUMENTATION CODES:   Severe malnutrition in context of chronic illness  INTERVENTION:   Monitor for ability to reinitiate EN -Pt will need EN access as Cortrak was removed -Continue to recommend PEG placement when GI issues resolve  If unable to tolerate diet advancement/resuming TF, may need to consider TPN  Recommend holding [place] for now; per chart review, questran started for loose stools which have resolved   NUTRITION DIAGNOSIS:   Severe Malnutrition related to chronic illness (COPD) as evidenced by severe muscle depletion, severe fat depletion.  Continues  GOAL:   Patient will meet greater than or equal to 90% of their needs  Not met  MONITOR:   PO intake, Labs, Weight trends, Skin, I & O's, TF tolerance  REASON FOR ASSESSMENT:   Ventilator    ASSESSMENT:   Pt admitted with difficulty breathing d/t acute respiratory failure with hypoxia and hypercapnia. Pt with diagnosis of PNA 4/16. PMH significant for COPD, uncontrolled anxiety.  5/23 CT abdomen: dilated SB loops in pelvis near surgical anastomotic site, mild gastric distention, stool in colon  Pt with difficult managing secretions, supplemental oxygen via trach has been increased  NPO currently. Noted possible PEG still pending.   Pt vomited on 5/23 after CPT done via chest vest, TF had been on hold for therapy. Pt become nauseated and vomited a large amount.  TF remains on hold.   Noted pt eating 0% of meals since last assessment prior to NPO status yesterday. Continue to recommend PEG tube once GI issues resolve.   +flatus, no BM, no N/V x 24 hours Reviewed surgery notes from today, possible CL diet vs NG insertion  Pt had been started on questran while in ICU for "loose stools." Now that this issue has resolved and now with ileus vs obstruction, recommend holding questran  No enteral access currently. Noted Cortrak removed after becoming clogged.   Labs: reviewed; NS  at 75 ml/hr Meds: questran, colace, miralax, senna   Diet Order:   Diet Order             Diet NPO time specified Except for: Other (See Comments)  Diet effective now                   EDUCATION NEEDS:   No education needs have been identified at this time  Skin:  Skin Assessment: Skin Integrity Issues: Skin Integrity Issues:: DTI DTI: buttocks  Last BM:  5/20  Height:   Ht Readings from Last 1 Encounters:  10/27/21 '5\' 1"'  (1.549 m)    Weight:   Wt Readings from Last 1 Encounters:  10/31/21 54.7 kg     BMI:  Body mass index is 22.79 kg/m.  Estimated Nutritional Needs:   Kcal:  1700-1900 kcals  Protein:  90-110 g  Fluid:  >/= 1.7 L   Kerman Passey MS, RDN, LDN, CNSC Registered Dietitian III Clinical Nutrition RD Pager and On-Call Pager Number Located in [place]

## 2021-10-31 NOTE — Progress Notes (Signed)
Occupational Therapy Treatment Patient Details Name: [name] MRN: [id number] DOB: May 18, 1956 Today's Date: 10/31/2021   History of present illness 66 y/o F reports diagnosis of PNA on 4/16 Admitted 4/17 to [facility], ICU with acute hypoxic/hypercarbic respiratory failure, failed BiPAP and required mechanical ventilation, transferred to [facility] 4/21 due to new finding of LV dysfunction Echo 4/20 with EF 40% , akinesis of apex and septal wall. . Failed weaning attempt 4/28. Trach placed on 5/1. PMH significant for COPD, tobacco use, HLD, GRED, and uncontrolled anxiety.   OT comments  Pt agreeable and motivated to work with therapies this session. She demonstrated improved activity tolerance, participating in tasks for 45+ mins. She required multiple rest breaks and increased time, however was able to tolerate multiple ADL tasks, sitting EOB, and exercises EOB. Updated discharge recommendations to AIR, as pt would benefit from intensive therapies to maximize her independence and safety. OT will continue to follow.    Recommendations for follow up therapy are one component of a multi-disciplinary discharge planning process, led by the attending physician.  Recommendations may be updated based on patient status, additional functional criteria and insurance authorization.    Follow Up Recommendations  Acute inpatient rehab (3hours/day)    Assistance Recommended at Discharge Frequent or constant Supervision/Assistance  Patient can return home with the following  Two people to help with walking and/or transfers;Two people to help with bathing/dressing/bathroom;Assistance with cooking/housework;Assistance with feeding;Direct supervision/assist for medications management;Direct supervision/assist for financial management;Assist for transportation;Help with stairs or ramp for entrance   Equipment Recommendations  Other (comment) (defer to next venue)    Recommendations for Other Services Rehab  consult    Precautions / Restrictions Precautions Precautions: Fall Restrictions Weight Bearing Restrictions: No       Mobility Bed Mobility Overal bed mobility: Needs Assistance Bed Mobility: Rolling, Supine to Sit, Sit to Supine Rolling: Min assist   Supine to sit: Mod assist, +2 for safety/equipment Sit to supine: Mod assist   General bed mobility comments: asssist for BLE and trunkal control    Transfers                   General transfer comment: deferred due to fatigue     Balance Overall balance assessment: Needs assistance Sitting-balance support: Single extremity supported, Feet supported Sitting balance-Leahy Scale: Poor Sitting balance - Comments: Pt with3 LoB EOB this session when not supporting herself with atleast 1 UE Postural control: Posterior lean                                 ADL either performed or assessed with clinical judgement   ADL Overall ADL's : Needs assistance/impaired Eating/Feeding: Minimal assistance Eating/Feeding Details (indicate cue type and reason): Min A to initiate and position self to pick up cup and bring straw to lips Grooming: Wash/dry hands;Wash/dry face;Minimal assistance;Sitting Grooming Details (indicate cue type and reason): Needs assist to maintain balance in sitting while she completes task     Lower Body Bathing: Total assistance;Bed level Lower Body Bathing Details (indicate cue type and reason): unable to assist due to fatigue, assisted with rolling in bed Upper Body Dressing : Maximal assistance;Sitting Upper Body Dressing Details (indicate cue type and reason): Pt attempting to doff and don hospital gown, increased weakness making it difficult for her to raise her arms to shoulder level to pull the sleeves up or down. Lower Body Dressing: Total assistance;Sitting/lateral leans Lower  Body Dressing Details (indicate cue type and reason): Pt unable to bend down to don socks or underwear or to  position herself in figure 4 position to assist with tasks     Toileting- Clothing Manipulation and Hygiene: Total assistance;Sitting/lateral lean Toileting - Clothing Manipulation Details (indicate cue type and reason): completed in bed after BM       General ADL Comments: Pt with need for rest breaks and increased time for tasks. She is requiring up to total assist for LB tasks and up to max assist for UB tasks due to weakness and poor truncal control.    Extremity/Trunk Assessment              Vision       Perception     Praxis      Cognition Arousal/Alertness: Awake/alert Behavior During Therapy: WFL for tasks assessed/performed, Anxious Overall Cognitive Status: Within Functional Limits for tasks assessed                                 General Comments: anxious with mobility but follows commands with increased time and cues        Exercises      Shoulder Instructions       General Comments VSS on 10L FiO2 40%    Pertinent Vitals/ Pain       Pain Assessment Pain Assessment: No/denies pain Pain Intervention(s): Monitored during session  Home Living                                          Prior Functioning/Environment              Frequency  Min 2X/week        Progress Toward Goals  OT Goals(current goals can now be found in the care plan section)  Progress towards OT goals: Progressing toward goals  Acute Rehab OT Goals Patient Stated Goal: None stated OT Goal Formulation: With patient Time For Goal Achievement: 11/07/21 Potential to Achieve Goals: Good ADL Goals Additional ADL Goal #1: Pt will complete rolling in bed both sides with mod assist as a precurser to sitting EOB. Additional ADL Goal #2: Pt will visually scan room and find 3 objects with independence. Additional ADL Goal #3: Pt will complete AAROM of BUE with 50%/mod assist. Additional ADL Goal #4: Pt will hold and utilize tooth brush sponge  to sucessfully brush her teeth and mouth with min A.  Plan Discharge plan needs to be updated;Frequency remains appropriate    Co-evaluation    PT/OT/SLP Co-Evaluation/Treatment: Yes Reason for Co-Treatment: Complexity of the patient's impairments (multi-system involvement)   OT goals addressed during session: ADL's and self-care;Strengthening/ROM      AM-PAC OT "6 Clicks" Daily Activity     Outcome Measure   Help from another person eating meals?: A Little Help from another person taking care of personal grooming?: A Lot Help from another person toileting, which includes using toliet, bedpan, or urinal?: A Lot Help from another person bathing (including washing, rinsing, drying)?: A Lot Help from another person to put on and taking off regular upper body clothing?: A Lot Help from another person to put on and taking off regular lower body clothing?: Total 6 Click Score: 12    End of Session Equipment Utilized During Treatment: Oxygen  OT Visit  Diagnosis: Unsteadiness on feet (R26.81);Other abnormalities of gait and mobility (R26.89);Muscle weakness (generalized) (M62.81)   Activity Tolerance Patient tolerated treatment well   Patient Left in bed;with bed alarm set;with call bell/phone within reach   Nurse Communication Mobility status        Time: [id number] OT Time Calculation (min): 54 min  Charges: OT General Charges $OT Visit: 1 Visit OT Treatments $Self Care/Home Management : 8-22 mins $Therapeutic Activity: 8-22 mins  Banyan Goodchild H., OTR/L Acute Rehabilitation  Armenta Erskin Elane Bing PlumeHaynes 10/31/2021, 2:58 PM

## 2021-10-31 NOTE — Progress Notes (Signed)
Inpatient Rehab Admissions Coordinator:   I do not have a bed for this Pt. On CIR today, as she is not yet medically ready (remains on 40% fio2 with plans for PEG). I will continue to follow for potential admit pending insurance auth and bed availability.   Megan Salon, MS, CCC-SLP Rehab Admissions Coordinator  [phone] (celll) [phone] (office)

## 2021-10-31 NOTE — Progress Notes (Signed)
PROGRESS NOTE    [name]  [id number] DOB: 11/10/1955 DOA: 09/24/2021 PCP: Everardo Beals, NP   Brief Narrative:  [name] is a 66 y.o. female with PMH significant for GOLD 3 COPD, quit smoking 2021, uncontrolled anxiety. Patient presented to ED on 4/17 with complaint of difficulty breathing, cough, wheezing, fatigue with symptoms worsening for 1 week. EMS noted hypoxia. Patient required BiPAP in the ED and was admitted to hospitalist service, however her respiratory status continued to worsen. On 4/19, she was intubated and transferred to ICU. She was treated for pneumonia and was found to have  newly diagnosed acute systolic heart failure. Patient was unable to wean off the ventilator successfully and underwent tracheostomy placement on 5/1. She was transferred out of the ICU on 5/15. She is currently receiving tube feeds via NG tube with plans for PEG tube placement.   Assessment and Plan:  Acute respiratory failure with hypoxia and hypercapnia Tracheostomy/vent dependent respiratory failure Patient admitted initially with COPD exacerbation and managed on BiPAP. Worsening respiratory failure lead to [facility] consult and requirement for mechanical ventilation. Patient intubated on 4/19. Prolonged ventilator wean and so tracheostomy performed on 5/1. Patient having trouble controlling her secretions. Supplemental oxygen increased to 10 L/min -Continue trach care per PCCM -Continue chest physiotherapy and hypertonic saline -Wean oxygen as able  Abdominal distension Partial SBO vs Ileus New issue. Patient with decreased flatus per her report in addition to infrequent bowel movements, even on tube feeds. Abdominal x-ray ordered 5/23 which is concerning for partial SBO. Associated abdominal pain. CT abdomen/pelvis confirms concern for partial SBO but also shows contrast from prior CT retained in the colon. General surgery consulted and are leaning more towards ileus diagnosis.  Symptoms appear improved today.  -General surgery recommendations: sips today and if tolerates, will be able to avoid NG; if does not tolerate, will need NG tube placement and intermittent suction and likely initiation of SBO protocol.  Leukocytosis Unsure if etiology at this time but recent CT chest significant for multiple nodules concerning for infection. Recently completed antibiotics. -Daily CBC for now  Pulmonary nodules Concerning for infection. Most recent blood cultures (5/17) with no growth. Procalcitonin undetectable. Afebrile. Repeat blood cultures (5/23) are no growth to date. -Follow-up blood culture -Follow-up CT chest w/contrast in 3 months  Hospital acquired pneumonia, resolved. Tracheal aspirate (4/27) significant for klebsiella pneumoniae infection. Patient completed 6 days of antibiotic therapy with Ceftriaxone/Cefepime.  COPD exacerbation Initial problem which led to acute respiratory failure and mechanical ventilation. Patient treated with Steroids and nebulizer treatments. Exacerbation resolved. -Continue Brovana, Pulmicort scheduled and Xopenex PRN  Acute systolic heart failure LVEF of 40% on recent Transthoracic Echocardiogram with focal WMA. Cardiology consulted with recommendation for outpatient ischemic evaluation.  Acute metabolic encephalopathy Resolved. Managed with Seroquel which is being decreased.  Severe anxiety Continue Klonopin and Seroquel  Thrombocytopenia Transient in setting of acute illness. Improved.  Severe malnutrition Cortrak feeding tube placed on 4/28. Speech therapy on board. Unable to wean off tube feeds, so plan is for placement of a PEG tube. Patient advanced to a regular diet on 5/11. Tube feeds transitioned to overnight and are now held secondary to recent vomiting. -Hold tube feeds until cleared by general surgery  Hyperlipidemia -Continue Crestor  Hyperglycemia Hemoglobin A1C of 5.4%  Sepsis Not present on admission.  Secondary to recurrent pneumonia. No bacteremia. Now resolved.   DVT prophylaxis: Lovenox Code Status:   Code Status: Full Code Family Communication: None at bedside  Disposition Plan: Discharge hopefully to CIR once medically stable.   Consultants:  PCCM Cardiology  Procedures:  Transthoracic Echocardiogram (09/27/21)  Antimicrobials: Ceftriaxone Cefepime    Subjective: Patient reports being thirsty today. No abdominal pain.  Objective: BP 116/71   Pulse 87   Temp 98 F (36.7 C)   Resp (!) 22   Ht 5\' 1"  (1.549 m)   Wt 54.7 kg   SpO2 95%   BMI 22.79 kg/m   Examination:  General exam: Appears calm and comfortable Respiratory system: Clear to auscultation. Respiratory effort normal. Cardiovascular system: S1 & S2 heard, RRR. No murmurs, rubs, gallops or clicks. Gastrointestinal system: Abdomen is distended, soft and appears to still be mildly tender. Normal bowel sounds heard. Central nervous system: Alert and oriented. No focal neurological deficits. Musculoskeletal: No edema. No calf tenderness Skin: No cyanosis. No rashes Psychiatry: Judgement and insight appear normal. Mood & affect appropriate.    Data Reviewed: I have personally reviewed following labs and imaging studies  CBC Lab Results  Component Value Date   WBC 20.1 (H) 10/31/2021   RBC 2.94 (L) 10/31/2021   HGB 9.4 (L) 10/31/2021   HCT 30.1 (L) 10/31/2021   MCV 102.4 (H) 10/31/2021   MCH 32.0 10/31/2021   PLT 335 10/31/2021   MCHC 31.2 10/31/2021   RDW 15.8 (H) 10/31/2021   LYMPHSABS 2.1 10/30/2021   MONOABS 1.5 (H) 10/30/2021   EOSABS 0.0 10/30/2021   BASOSABS 0.1 [id number]     Last metabolic panel Lab Results  Component Value Date   NA 140 10/31/2021   K 4.0 10/31/2021   CL 98 10/31/2021   CO2 34 (H) 10/31/2021   BUN 18 10/31/2021   CREATININE 0.32 (L) 10/31/2021   GLUCOSE 103 (H) 10/31/2021   GFRNONAA >60 10/31/2021   GFRAA >60 05/14/2019   CALCIUM 9.0 10/31/2021   PHOS 4.0  10/25/2021   PROT 6.3 (L) 10/27/2021   ALBUMIN 1.9 (L) 10/27/2021   BILITOT 0.4 10/27/2021   ALKPHOS 200 (H) 10/27/2021   AST 38 10/27/2021   ALT 86 (H) 10/27/2021   ANIONGAP 8 10/31/2021    GFR: Estimated Creatinine Clearance: 54.3 mL/min (A) (by C-G formula based on SCr of 0.32 mg/dL (L)).  Recent Results (from the past 240 hour(s))  Culture, blood (x 2)     Status: None   Collection Time: 10/24/21  4:25 AM   Specimen: BLOOD LEFT HAND  Result Value Ref Range Status   Specimen Description BLOOD LEFT HAND  Final   Special Requests   Final    BOTTLES DRAWN AEROBIC ONLY Blood Culture adequate volume   Culture   Final    NO GROWTH 5 DAYS Performed at [facility], [address], [place], [place] [address]    Report Status 10/29/2021 FINAL  Final  Culture, blood (x 2)     Status: None   Collection Time: 10/24/21  4:25 AM   Specimen: BLOOD LEFT ARM  Result Value Ref Range Status   Specimen Description BLOOD LEFT ARM  Final   Special Requests   Final    BOTTLES DRAWN AEROBIC ONLY Blood Culture adequate volume   Culture   Final    NO GROWTH 5 DAYS Performed at [facility], [place] [address], [place], [place] [address]    Report Status 10/29/2021 FINAL  Final  MRSA Next Gen by PCR, Nasal     Status: None   Collection Time: 10/24/21  5:05 AM  Result Value  Ref Range Status   MRSA by PCR Next Gen NOT DETECTED NOT DETECTED Final    Comment: (NOTE) The GeneXpert MRSA Assay (FDA approved for NASAL specimens only), is one component of a comprehensive MRSA colonization surveillance program. It is not intended to diagnose MRSA infection nor to guide or monitor treatment for MRSA infections. Test performance is not FDA approved in patients less than 30 years old. Performed at [facility], [address] [place], [place] [address]   Culture, blood (Routine X 2) w Reflex to ID Panel     Status: None (Preliminary result)   Collection Time: 10/30/21  1:56 PM    Specimen: BLOOD  Result Value Ref Range Status   Specimen Description BLOOD RIGHT ANTECUBITAL  Final   Special Requests   Final    BOTTLES DRAWN AEROBIC AND ANAEROBIC Blood Culture results may not be optimal due to an inadequate volume of blood received in culture bottles   Culture   Final    NO GROWTH 1 DAY Performed at [facility], [address] [place], [place] [address]    Report Status PENDING  Incomplete  Culture, blood (Routine X 2) w Reflex to ID Panel     Status: None (Preliminary result)   Collection Time: 10/30/21  1:59 PM   Specimen: BLOOD  Result Value Ref Range Status   Specimen Description BLOOD RIGHT ANTECUBITAL  Final   Special Requests   Final    BOTTLES DRAWN AEROBIC AND ANAEROBIC Blood Culture adequate volume   Culture   Final    NO GROWTH 1 DAY Performed at [facility], [address] [place], [place] [address]    Report Status PENDING  Incomplete      Radiology Studies: CT ABDOMEN PELVIS W CONTRAST  Result Date: 10/30/2021 CLINICAL DATA:  Acute generalized abdominal pain. EXAM: CT ABDOMEN AND PELVIS WITH CONTRAST TECHNIQUE: Multidetector CT imaging of the abdomen and pelvis was performed using the standard protocol following bolus administration of intravenous contrast. RADIATION DOSE REDUCTION: This exam was performed according to the departmental dose-optimization program which includes automated exposure control, adjustment of the mA and/or kV according to patient size and/or use of iterative reconstruction technique. CONTRAST:  OMNIPAQUE IOHEXOL 300 MG/ML  SOLN COMPARISON:  Oct 27, 2021.  Oct 28, 2021. FINDINGS: Lower chest: Grossly stable nodular densities are noted in right lower lobe as noted on CT scan of Oct 28, 2021. Hepatobiliary: Hepatic steatosis. No gallstones or biliary dilatation is noted. Pancreas: Unremarkable. No pancreatic ductal dilatation or surrounding inflammatory changes. Spleen: Normal in size without focal  abnormality. Adrenals/Urinary Tract: Adrenal glands and kidneys appear normal. Status post cystectomy with ileal conduit placement. Stomach/Bowel: Distal tip of feeding tube is seen in distal stomach. Mild gastric distention is noted. Stool and contrast is noted throughout the colon. Dilated small bowel loops are noted which extend to the pelvis near surgical anastomotic site, concerning for possible partial obstruction. Vascular/Lymphatic: Aortic atherosclerosis. No enlarged abdominal or pelvic lymph nodes. Reproductive: Status post hysterectomy. No adnexal masses. Other: No hernia is noted. Small amount of free fluid is noted in the pelvis of uncertain significance. Musculoskeletal: No acute or significant osseous findings. IMPRESSION: Dilated small bowel loops are noted which extend to the pelvis near surgical anastomotic site, concerning for partial obstruction. Stool and contrast are noted throughout the nondilated colon. Patient is status post cystectomy and ileal conduit placement. Hepatic steatosis. Small amount of free fluid is noted in the  pelvis of uncertain significance. Distal tip of feeding tube is seen in distal stomach. Mild gastric distention is noted. Aortic Atherosclerosis (ICD10-I70.0). Electronically Signed   By: Marijo Conception M.D.   On: 10/30/2021 15:13   DG Abd Portable 1V  Result Date: 10/31/2021 CLINICAL DATA:  Small bowel obstruction. EXAM: PORTABLE ABDOMEN - 1 VIEW COMPARISON:  Radiograph 10/30/2021 FINDINGS: Interval decrease in prominence of central small bowel lobes. Loops measure less than 3 cm. Moderate volume stool in the descending colon and rectum. Gas in the rectum. IMPRESSION: Improving small bowel obstruction pattern. Electronically Signed   By: Suzy Bouchard M.D.   On: 10/31/2021 09:46   DG Abd Portable 1V  Result Date: 10/30/2021 CLINICAL DATA:  abdominal distention EXAM: Portable AP supine view of the abdomen COMPARISON:  Abdominal x-ray from Oct 21, 2021  FINDINGS: Again seen is the feeding tube with its tip at the gastric pylorus. Mild gaseous distention of the small bowel loops, with air distended small bowel loop measuring 4.2 cm in greatest transverse diameter at the right lower abdomen and has increased in the interim. There is some air seen in the large bowel loops as well. No radio-opaque calculi or other significant radiographic abnormality are seen. IMPRESSION: Tip of the enteric tube terminates at the pylorus. Mild gaseous distention of the small bowel loops on the basis of likely partial small bowel obstruction and has increased in the interim. Electronically Signed   By: Frazier Richards M.D.   On: 10/30/2021 11:19      LOS: 17 days    Cordelia Poche, MD [facility] 10/31/2021, 3:26 PM   If 7PM-7AM, please contact night-coverage [email or web address]

## 2021-10-31 NOTE — Progress Notes (Signed)
CPT held at this time. Pt with possible NG placement.

## 2021-10-31 NOTE — Consult Note (Signed)
[facility] Consult Note  [name] 1955-12-11  [id number].    Requesting MD: Jacquelin Hawking Chief Complaint/Reason for Consult: pSBO  HPI:  [name] is a 66yo female PMH COPD who was admitted to [facility] 4/17 with COPD exacerbation. She was intubated and ultimately required tracheostomy 5/1.  She was treated for pneumonia and was found to have newly diagnosed acute systolic heart failure. She had a Cortrak and was tolerating tube feedings until yesterday when she developed abdominal pain/ distension and n/v. CT scan showed dilated small bowel loops in the pelvis near surgical anastomotic site concerning for partial obstruction; small amount of free fluid is noted in the pelvis of uncertain significance; mild gastric distension; stool and contrast noted throughout the nondilated colon. Cortrak became clogged and has since been removed. This morning she states that her symptoms have resolved. Denies abdominal pain/ bloating, nausea or vomiting. Passing flatus this morning, no BM.  Abdominal surgical history: hx Cystectomy and ileal neo-bladder 2006, transabdominal and transvaginal fistula repair 2006, hysterectomy    Family History  Problem Relation Age of Onset   Atrial fibrillation Sister     Past Medical History:  Diagnosis Date   Anxiety    Chronic back pain    COPD (chronic obstructive pulmonary disease) (HCC)    Tobacco abuse     Past Surgical History:  Procedure Laterality Date   ABDOMINAL HYSTERECTOMY     BACK SURGERY     ILEO LOOP NEOBLADDER     VESICOVAGINAL FISTULA REPAIR      Social History:  reports that she quit smoking about 2 years ago. Her smoking use included cigarettes. She has a 90.00 pack-year smoking history. She has never used smokeless tobacco. She reports that she does not drink alcohol and does not use drugs.  Allergies: No Known Allergies  Facility-Administered Medications Prior to Admission  Medication Dose Route Frequency  Provider Last Rate Last Admin   budesonide (PULMICORT) nebulizer solution 0.25 mg  0.25 mg Nebulization BID Leslye Peer, MD       Medications Prior to Admission  Medication Sig Dispense Refill   albuterol (VENTOLIN HFA) 108 (90 Base) MCG/ACT inhaler INHALE 2 PUFFS INTO THE LUNGS EVERY 6 HOURS AS NEEDED FOR WHEEZE/SHORTNESS OF BREATH 36 each 11   ALPRAZolam (XANAX) 1 MG tablet Take 0.5-1 mg by mouth daily as needed for sleep or anxiety.     Budeson-Glycopyrrol-Formoterol (BREZTRI AEROSPHERE) 160-9-4.8 MCG/ACT AERO Inhale 2 puffs into the lungs 2 (two) times daily. (Patient taking differently: Inhale 2 puffs into the lungs 2 (two) times daily. Before lunch) 10.7 g 5   Budeson-Glycopyrrol-Formoterol (BREZTRI AEROSPHERE) 160-9-4.8 MCG/ACT AERO Inhale 2 puffs into the lungs in the morning and at bedtime. 5.9 g 0   busPIRone (BUSPAR) 10 MG tablet Take 10 mg by mouth 3 (three) times daily.     cefTRIAXone (ROCEPHIN) 1 g injection ceftriaxone 1 gram solution for injection  Take 1 g every day by injection route as directed for 1 day.     docusate sodium (COLACE) 100 MG capsule Take 1 capsule (100 mg total) by mouth 2 (two) times daily. (Patient taking differently: Take 100 mg by mouth 2 (two) times daily as needed (constipation).) 10 capsule 0   doxycycline (VIBRAMYCIN) 100 MG capsule Take 100 mg by mouth 2 (two) times daily. For 7 days started on 09/23/21     fluticasone (FLONASE) 50 MCG/ACT nasal spray Place 1 spray into both nostrils daily as needed for allergies.  ibuprofen (ADVIL) 200 MG tablet Take 400 mg by mouth every 8 (eight) hours as needed (pain.).     ipratropium (ATROVENT HFA) 17 MCG/ACT inhaler Inhale 2 puffs into the lungs every 4 (four) hours as needed for wheezing.     loratadine (CLARITIN) 10 MG tablet Take 10 mg by mouth daily as needed for allergies.     Magnesium 250 MG TABS Take 500 mg by mouth in the morning.     montelukast (SINGULAIR) 10 MG tablet Take 10 mg by mouth in the  morning.     omeprazole (PRILOSEC) 20 MG capsule Take 20 mg by mouth daily.     predniSONE (STERAPRED UNI-PAK 21 TAB) 10 MG (21) TBPK tablet Take 10 mg by mouth See admin instructions. 6,5,4,3,2,1     revefenacin (YUPELRI) 175 MCG/3ML nebulizer solution Take 3 mLs (175 mcg total) by nebulization daily. 90 mL 3   tretinoin (RETIN-A) 0.1 % cream Apply topically at bedtime.     triamcinolone acetonide (TRIESENCE) 40 MG/ML SUSP triamcinolone acetonide 40 mg/mL suspension for injection  Take 80 mg every day by injection route for 1 day.     arformoterol (BROVANA) 15 MCG/2ML NEBU Take 2 mLs (15 mcg total) by nebulization 2 (two) times daily. (Patient not taking: Reported on 09/24/2021) 120 mL 6   rosuvastatin (CRESTOR) 5 MG tablet Take 1 tablet (5 mg total) by mouth daily. (Patient not taking: Reported on 09/24/2021) 90 tablet 3   SPIRIVA HANDIHALER 18 MCG inhalation capsule INHALE 1 CAPSULE VIA HANDIHALER ONCE DAILY AT THE SAME TIME EVERY DAY (Patient not taking: Reported on 09/24/2021) 30 capsule 6    Prior to Admission medications   Medication Sig Start Date End Date Taking? Authorizing Provider  albuterol (VENTOLIN HFA) 108 (90 Base) MCG/ACT inhaler INHALE 2 PUFFS INTO THE LUNGS EVERY 6 HOURS AS NEEDED FOR WHEEZE/SHORTNESS OF BREATH 12/14/20  Yes Byrum, Les Pou, MD  ALPRAZolam Prudy Feeler) 1 MG tablet Take 0.5-1 mg by mouth daily as needed for sleep or anxiety. 03/12/19  Yes [provider]  Budeson-Glycopyrrol-Formoterol (BREZTRI AEROSPHERE) 160-9-4.8 MCG/ACT AERO Inhale 2 puffs into the lungs 2 (two) times daily. Patient taking differently: Inhale 2 puffs into the lungs 2 (two) times daily. Before lunch 12/21/20  Yes Byrum, Les Pou, MD  Budeson-Glycopyrrol-Formoterol (BREZTRI AEROSPHERE) 160-9-4.8 MCG/ACT AERO Inhale 2 puffs into the lungs in the morning and at bedtime. 02/06/21  Yes Parrett, Tammy S, NP  busPIRone (BUSPAR) 10 MG tablet Take 10 mg by mouth 3 (three) times daily.   Yes [provider]  cefTRIAXone (ROCEPHIN) 1 g injection ceftriaxone 1 gram solution for injection  Take 1 g every day by injection route as directed for 1 day.   Yes [provider]  docusate sodium (COLACE) 100 MG capsule Take 1 capsule (100 mg total) by mouth 2 (two) times daily. Patient taking differently: Take 100 mg by mouth 2 (two) times daily as needed (constipation). 05/02/19  Yes Pollyann Savoy, DO  doxycycline (VIBRAMYCIN) 100 MG capsule Take 100 mg by mouth 2 (two) times daily. For 7 days started on 09/23/21   Yes [provider]  fluticasone (FLONASE) 50 MCG/ACT nasal spray Place 1 spray into both nostrils daily as needed for allergies.   Yes [provider]  ibuprofen (ADVIL) 200 MG tablet Take 400 mg by mouth every 8 (eight) hours as needed (pain.).   Yes [provider]  ipratropium (ATROVENT HFA) 17 MCG/ACT inhaler Inhale 2 puffs into the lungs  every 4 (four) hours as needed for wheezing.   Yes [provider]  loratadine (CLARITIN) 10 MG tablet Take 10 mg by mouth daily as needed for allergies.   Yes [provider]  Magnesium 250 MG TABS Take 500 mg by mouth in the morning.   Yes [provider]  montelukast (SINGULAIR) 10 MG tablet Take 10 mg by mouth in the morning.   Yes [provider]  omeprazole (PRILOSEC) 20 MG capsule Take 20 mg by mouth daily.   Yes [provider]  predniSONE (STERAPRED UNI-PAK 21 TAB) 10 MG (21) TBPK tablet Take 10 mg by mouth See admin instructions. 9,1,4,7,8,2 09/22/21  Yes [provider]  revefenacin (YUPELRI) 175 MCG/3ML nebulizer solution Take 3 mLs (175 mcg total) by nebulization daily. 02/06/21  Yes Leslye Peer, MD  tretinoin (RETIN-A) 0.1 % cream Apply topically at bedtime. 08/17/21  Yes [provider]  triamcinolone acetonide (TRIESENCE) 40 MG/ML SUSP triamcinolone acetonide 40 mg/mL suspension for injection  Take 80 mg every day by injection route  for 1 day.   Yes [provider]  arformoterol (BROVANA) 15 MCG/2ML NEBU Take 2 mLs (15 mcg total) by nebulization 2 (two) times daily. Patient not taking: Reported on 09/24/2021 02/06/21   Leslye Peer, MD  rosuvastatin (CRESTOR) 5 MG tablet Take 1 tablet (5 mg total) by mouth daily. Patient not taking: Reported on 09/24/2021 01/22/21 09/13/23  Pricilla Riffle, MD  SPIRIVA HANDIHALER 18 MCG inhalation capsule INHALE 1 CAPSULE VIA HANDIHALER ONCE DAILY AT THE SAME TIME EVERY DAY Patient not taking: Reported on 09/24/2021 07/17/20   Parrett, Virgel Bouquet, NP    Blood pressure 116/71, pulse 85, temperature 98 F (36.7 C), resp. rate (!) 23, height  (1.549 m), weight 54.7 kg, SpO2 99 %. Physical Exam: General: pleasant, chronically ill appearing female who is laying in bed in NAD HEENT: head is normocephalic, atraumatic.  Sclera are noninjected.  Pupils equal and round.  Trach in place Heart: regular, rate, and rhythm Lungs: Respiratory effort nonlabored Abd: well healed midline incision, soft, mild distension, nontender, +BS Skin: warm and dry with no masses, lesions, or rashes  Results for orders placed or performed during the hospital encounter of 09/24/21 (from the past 48 hour(s))  Glucose, capillary     Status: Abnormal   Collection Time: 10/29/21 11:48 AM  Result Value Ref Range   Glucose-Capillary 109 (H) 70 - 99 mg/dL    Comment: Glucose reference range applies only to samples taken after fasting for at least 8 hours.  Glucose, capillary     Status: None   Collection Time: 10/29/21  4:38 PM  Result Value Ref Range   Glucose-Capillary 90 70 - 99 mg/dL    Comment: Glucose reference range applies only to samples taken after fasting for at least 8 hours.  Glucose, capillary     Status: Abnormal   Collection Time: 10/29/21  8:56 PM  Result Value Ref Range   Glucose-Capillary 114 (H) 70 - 99 mg/dL    Comment: Glucose reference range applies only to samples taken after fasting for  at least 8 hours.  Glucose, capillary     Status: Abnormal   Collection Time: 10/29/21 11:48 PM  Result Value Ref Range   Glucose-Capillary 125 (H) 70 - 99 mg/dL    Comment: Glucose reference range applies only to samples taken after fasting for at least 8 hours.  CBC with Differential/Platelet     Status: Abnormal  Collection Time: 10/30/21  3:09 AM  Result Value Ref Range   WBC 21.0 (H) 4.0 - 10.5 K/uL   RBC 2.93 (L) 3.87 - 5.11 MIL/uL   Hemoglobin 9.4 (L) 12.0 - 15.0 g/dL   HCT 16.1 (L) 09.6 - 04.5 %   MCV 101.0 (H) 80.0 - 100.0 fL   MCH 32.1 26.0 - 34.0 pg   MCHC 31.8 30.0 - 36.0 g/dL   RDW 40.9 81.1 - 91.4 %   Platelets 327 150 - 400 K/uL   nRBC 0.0 0.0 - 0.2 %   Neutrophils Relative % 81 %   Neutro Abs 16.9 (H) 1.7 - 7.7 K/uL   Lymphocytes Relative 10 %   Lymphs Abs 2.1 0.7 - 4.0 K/uL   Monocytes Relative 7 %   Monocytes Absolute 1.5 (H) 0.1 - 1.0 K/uL   Eosinophils Relative 0 %   Eosinophils Absolute 0.0 0.0 - 0.5 K/uL   Basophils Relative 0 %   Basophils Absolute 0.1 0.0 - 0.1 K/uL   Immature Granulocytes 2 %   Abs Immature Granulocytes 0.44 (H) 0.00 - 0.07 K/uL    Comment: Performed at [facility], [address], [place], [place] [address]  Basic metabolic panel     Status: Abnormal   Collection Time: 10/30/21  3:09 AM  Result Value Ref Range   Sodium 136 135 - 145 mmol/L   Potassium 3.9 3.5 - 5.1 mmol/L   Chloride 96 (L) 98 - 111 mmol/L   CO2 32 22 - 32 mmol/L   Glucose, Bld 134 (H) 70 - 99 mg/dL    Comment: Glucose reference range applies only to samples taken after fasting for at least 8 hours.   BUN 19 8 - 23 mg/dL   Creatinine, Ser <6.21 (L) 0.44 - 1.00 mg/dL   Calcium 8.5 (L) 8.9 - 10.3 mg/dL   GFR, Estimated NOT CALCULATED >60 mL/min    Comment: (NOTE) Calculated using the CKD-EPI Creatinine Equation (2021)    Anion gap 8 5 - 15    Comment: Performed at [facility], [address] [place], [place] [address]  Procalcitonin     Status:  None   Collection Time: 10/30/21  3:09 AM  Result Value Ref Range   Procalcitonin <0.10 ng/mL    Comment:        Interpretation: PCT (Procalcitonin) <= 0.5 ng/mL: Systemic infection (sepsis) is not likely. Local bacterial infection is possible. (NOTE)       Sepsis PCT Algorithm           Lower Respiratory Tract                                      Infection PCT Algorithm    ----------------------------     ----------------------------         PCT < 0.25 ng/mL                PCT < 0.10 ng/mL          Strongly encourage             Strongly discourage   discontinuation of antibiotics    initiation of antibiotics    ----------------------------     -----------------------------       PCT 0.25 - 0.50 ng/mL            PCT 0.10 - 0.25 ng/mL  OR       >80% decrease in PCT            Discourage initiation of                                            antibiotics      Encourage discontinuation           of antibiotics    ----------------------------     -----------------------------         PCT >= 0.50 ng/mL              PCT 0.26 - 0.50 ng/mL               AND        <80% decrease in PCT             Encourage initiation of                                             antibiotics       Encourage continuation           of antibiotics    ----------------------------     -----------------------------        PCT >= 0.50 ng/mL                  PCT > 0.50 ng/mL               AND         increase in PCT                  Strongly encourage                                      initiation of antibiotics    Strongly encourage escalation           of antibiotics                                     -----------------------------                                           PCT <= 0.25 ng/mL                                                 OR                                        > 80% decrease in PCT                                      Discontinue / Do not initiate  antibiotics  Performed at [facility], [address] [place], [place] [address]   Glucose, capillary     Status: Abnormal   Collection Time: 10/30/21  4:38 AM  Result Value Ref Range   Glucose-Capillary 133 (H) 70 - 99 mg/dL    Comment: Glucose reference range applies only to samples taken after fasting for at least 8 hours.  Glucose, capillary     Status: Abnormal   Collection Time: 10/30/21  7:26 AM  Result Value Ref Range   Glucose-Capillary 113 (H) 70 - 99 mg/dL    Comment: Glucose reference range applies only to samples taken after fasting for at least 8 hours.  Glucose, capillary     Status: Abnormal   Collection Time: 10/30/21 11:25 AM  Result Value Ref Range   Glucose-Capillary 130 (H) 70 - 99 mg/dL    Comment: Glucose reference range applies only to samples taken after fasting for at least 8 hours.  Culture, blood (Routine X 2) w Reflex to ID Panel     Status: None (Preliminary result)   Collection Time: 10/30/21  1:56 PM   Specimen: BLOOD  Result Value Ref Range   Specimen Description BLOOD RIGHT ANTECUBITAL    Special Requests      BOTTLES DRAWN AEROBIC AND ANAEROBIC Blood Culture results may not be optimal due to an inadequate volume of blood received in culture bottles   Culture      NO GROWTH < 24 HOURS Performed at [facility], [address] [place], [place] [address]    Report Status PENDING   Culture, blood (Routine X 2) w Reflex to ID Panel     Status: None (Preliminary result)   Collection Time: 10/30/21  1:59 PM   Specimen: BLOOD  Result Value Ref Range   Specimen Description BLOOD RIGHT ANTECUBITAL    Special Requests      BOTTLES DRAWN AEROBIC AND ANAEROBIC Blood Culture adequate volume   Culture      NO GROWTH < 24 HOURS Performed at [facility], [address] [place], [place] [address]    Report Status PENDING   Glucose, capillary     Status: Abnormal   Collection Time: 10/30/21  4:36 PM  Result  Value Ref Range   Glucose-Capillary 107 (H) 70 - 99 mg/dL    Comment: Glucose reference range applies only to samples taken after fasting for at least 8 hours.  Glucose, capillary     Status: None   Collection Time: 10/30/21  8:25 PM  Result Value Ref Range   Glucose-Capillary 81 70 - 99 mg/dL    Comment: Glucose reference range applies only to samples taken after fasting for at least 8 hours.  Glucose, capillary     Status: Abnormal   Collection Time: 10/30/21 11:24 PM  Result Value Ref Range   Glucose-Capillary 101 (H) 70 - 99 mg/dL    Comment: Glucose reference range applies only to samples taken after fasting for at least 8 hours.  Glucose, capillary     Status: Abnormal   Collection Time: 10/31/21  3:53 AM  Result Value Ref Range   Glucose-Capillary 102 (H) 70 - 99 mg/dL    Comment: Glucose reference range applies only to samples taken after fasting for at least 8 hours.  Procalcitonin     Status: None   Collection Time: 10/31/21  4:45 AM  Result Value Ref Range   Procalcitonin <0.10 ng/mL    Comment:  Interpretation: PCT (Procalcitonin) <= 0.5 ng/mL: Systemic infection (sepsis) is not likely. Local bacterial infection is possible. (NOTE)       Sepsis PCT Algorithm           Lower Respiratory Tract                                      Infection PCT Algorithm    ----------------------------     ----------------------------         PCT < 0.25 ng/mL                PCT < 0.10 ng/mL          Strongly encourage             Strongly discourage   discontinuation of antibiotics    initiation of antibiotics    ----------------------------     -----------------------------       PCT 0.25 - 0.50 ng/mL            PCT 0.10 - 0.25 ng/mL               OR       >80% decrease in PCT            Discourage initiation of                                            antibiotics      Encourage discontinuation           of antibiotics    ----------------------------      -----------------------------         PCT >= 0.50 ng/mL              PCT 0.26 - 0.50 ng/mL               AND        <80% decrease in PCT             Encourage initiation of                                             antibiotics       Encourage continuation           of antibiotics    ----------------------------     -----------------------------        PCT >= 0.50 ng/mL                  PCT > 0.50 ng/mL               AND         increase in PCT                  Strongly encourage                                      initiation of antibiotics    Strongly encourage escalation           of antibiotics                                     -----------------------------  PCT <= 0.25 ng/mL                                                 OR                                        > 80% decrease in PCT                                      Discontinue / Do not initiate                                             antibiotics  Performed at [facility], [address], [place], [place] [address]   CBC     Status: Abnormal   Collection Time: 10/31/21  4:45 AM  Result Value Ref Range   WBC 20.1 (H) 4.0 - 10.5 K/uL   RBC 2.94 (L) 3.87 - 5.11 MIL/uL   Hemoglobin 9.4 (L) 12.0 - 15.0 g/dL   HCT 60.4 (L) 54.0 - 98.1 %   MCV 102.4 (H) 80.0 - 100.0 fL   MCH 32.0 26.0 - 34.0 pg   MCHC 31.2 30.0 - 36.0 g/dL   RDW 19.1 (H) 47.8 - 29.5 %   Platelets 335 150 - 400 K/uL   nRBC 0.0 0.0 - 0.2 %    Comment: Performed at [facility], [address] [place], [place] [address]  Basic metabolic panel     Status: Abnormal   Collection Time: 10/31/21  4:45 AM  Result Value Ref Range   Sodium 140 135 - 145 mmol/L   Potassium 4.0 3.5 - 5.1 mmol/L   Chloride 98 98 - 111 mmol/L   CO2 34 (H) 22 - 32 mmol/L   Glucose, Bld 103 (H) 70 - 99 mg/dL    Comment: Glucose reference range applies only to samples taken after fasting for at least 8 hours.   BUN 18 8 -  23 mg/dL   Creatinine, Ser 8.65 (L) 0.44 - 1.00 mg/dL   Calcium 9.0 8.9 - 78.4 mg/dL   GFR, Estimated >69 >62 mL/min    Comment: (NOTE) Calculated using the CKD-EPI Creatinine Equation (2021)    Anion gap 8 5 - 15    Comment: Performed at [facility], [address], [place], [place] [address]  Glucose, capillary     Status: Abnormal   Collection Time: 10/31/21  7:29 AM  Result Value Ref Range   Glucose-Capillary 100 (H) 70 - 99 mg/dL    Comment: Glucose reference range applies only to samples taken after fasting for at least 8 hours.   CT ABDOMEN PELVIS W CONTRAST  Result Date: 10/30/2021 CLINICAL DATA:  Acute generalized abdominal pain. EXAM: CT ABDOMEN AND PELVIS WITH CONTRAST TECHNIQUE: Multidetector CT imaging of the abdomen and pelvis was performed using the standard protocol following bolus administration of intravenous contrast. RADIATION DOSE REDUCTION: This exam was performed according to the departmental dose-optimization program which includes automated exposure control, adjustment of the mA and/or kV  according to patient size and/or use of iterative reconstruction technique. CONTRAST:  OMNIPAQUE IOHEXOL 300 MG/ML  SOLN COMPARISON:  Oct 27, 2021.  Oct 28, 2021. FINDINGS: Lower chest: Grossly stable nodular densities are noted in right lower lobe as noted on CT scan of Oct 28, 2021. Hepatobiliary: Hepatic steatosis. No gallstones or biliary dilatation is noted. Pancreas: Unremarkable. No pancreatic ductal dilatation or surrounding inflammatory changes. Spleen: Normal in size without focal abnormality. Adrenals/Urinary Tract: Adrenal glands and kidneys appear normal. Status post cystectomy with ileal conduit placement. Stomach/Bowel: Distal tip of feeding tube is seen in distal stomach. Mild gastric distention is noted. Stool and contrast is noted throughout the colon. Dilated small bowel loops are noted which extend to the pelvis near surgical anastomotic site, concerning for  possible partial obstruction. Vascular/Lymphatic: Aortic atherosclerosis. No enlarged abdominal or pelvic lymph nodes. Reproductive: Status post hysterectomy. No adnexal masses. Other: No hernia is noted. Small amount of free fluid is noted in the pelvis of uncertain significance. Musculoskeletal: No acute or significant osseous findings. IMPRESSION: Dilated small bowel loops are noted which extend to the pelvis near surgical anastomotic site, concerning for partial obstruction. Stool and contrast are noted throughout the nondilated colon. Patient is status post cystectomy and ileal conduit placement. Hepatic steatosis. Small amount of free fluid is noted in the pelvis of uncertain significance. Distal tip of feeding tube is seen in distal stomach. Mild gastric distention is noted. Aortic Atherosclerosis (ICD10-I70.0). Electronically Signed   By: Lupita Raider M.D.   On: 10/30/2021 15:13   DG Abd Portable 1V  Result Date: 10/30/2021 CLINICAL DATA:  abdominal distention EXAM: Portable AP supine view of the abdomen COMPARISON:  Abdominal x-ray from Oct 21, 2021 FINDINGS: Again seen is the feeding tube with its tip at the gastric pylorus. Mild gaseous distention of the small bowel loops, with air distended small bowel loop measuring 4.2 cm in greatest transverse diameter at the right lower abdomen and has increased in the interim. There is some air seen in the large bowel loops as well. No radio-opaque calculi or other significant radiographic abnormality are seen. IMPRESSION: Tip of the enteric tube terminates at the pylorus. Mild gaseous distention of the small bowel loops on the basis of likely partial small bowel obstruction and has increased in the interim. Electronically Signed   By: Marjo Bicker M.D.   On: 10/30/2021 11:19    Anti-infectives (From admission, onward)    Start     Dose/Rate Route Frequency Ordered Stop   10/25/21 0400  vancomycin (VANCOCIN) IVPB 1000 mg/200 mL premix  Status:   Discontinued        1,000 mg 200 mL/hr over 60 Minutes Intravenous Every 24 hours 10/24/21 0411 10/25/21 1513   10/24/21 1600  ceFEPIme (MAXIPIME) 2 g in sodium chloride 0.9 % 100 mL IVPB  Status:  Discontinued        2 g 200 mL/hr over 30 Minutes Intravenous Every 12 hours 10/24/21 0411 10/29/21 1457   10/24/21 0430  ceFEPIme (MAXIPIME) 2 g in sodium chloride 0.9 % 100 mL IVPB        2 g 200 mL/hr over 30 Minutes Intravenous  Once 10/24/21 0343 10/24/21 0719   10/24/21 0400  vancomycin (VANCOREADY) IVPB 1250 mg/250 mL        1,250 mg 166.7 mL/hr over 90 Minutes Intravenous  Once 10/24/21 0343 10/24/21 0603   10/06/21 1330  cefTRIAXone (ROCEPHIN) 2 g in sodium chloride 0.9 % 100 mL IVPB  2 g 200 mL/hr over 30 Minutes Intravenous Every 24 hours 10/06/21 1242 10/08/21 1529   10/04/21 0900  ceFEPIme (MAXIPIME) 2 g in sodium chloride 0.9 % 100 mL IVPB  Status:  Discontinued        2 g 200 mL/hr over 30 Minutes Intravenous Every 12 hours 10/04/21 0816 10/06/21 1242   10/03/21 0900  cefTRIAXone (ROCEPHIN) 1 g in sodium chloride 0.9 % 100 mL IVPB  Status:  Discontinued        1 g 200 mL/hr over 30 Minutes Intravenous Every 24 hours 10/03/21 0819 10/04/21 0816   09/25/21 2030  azithromycin (ZITHROMAX) tablet 500 mg       See Hyperspace for full Linked Orders Report.   500 mg Oral Daily 09/24/21 2020 09/28/21 0829   09/24/21 2020  azithromycin (ZITHROMAX) 500 mg in sodium chloride 0.9 % 250 mL IVPB       See Hyperspace for full Linked Orders Report.   500 mg 250 mL/hr over 60 Minutes Intravenous Every 24 hours 09/24/21 2020 09/24/21 2225        Assessment/Plan Partial SBO vs ileus - hx Cystectomy and ileal neo-bladder 2006, transabdominal and transvaginal fistula repair 2006, hysterectomy - CT 5/23 shows dilated small bowel loops in the pelvis near surgical anastomotic site concerning for partial obstruction; small amount of free fluid is noted in the pelvis of uncertain  significance; mild gastric distension; stool and contrast noted throughout the nondilated colon - Patient states that she feels better today. She has had no n/v in nearly 24 hours. Her CT did show contrast and stool in the colon and she is passing flatus this morning, no BM. Will check film and give suppository. Pending xray may trial clear liquids. If she fails then would recommend NG tube placement.  ID - none currently VTE - lovenox FEN - NPO Foley - none  COPD exacerbation Acute respiratory failure with hypoxia and hypercapnia Tracheostomy/vent dependent respiratory failure Pulmonary nodules Hospital acquired PNA - completed abx Acute systolic heart failure Severe malnutrition HLD  I reviewed hospitalist notes, last 24 h vitals and pain scores, last 48 h intake and output, last 24 h labs and trends, and last 24 h imaging results.   Franne FortsBrooke A Jaylyn Iyer, PA-C [facility] 10/31/2021, 9:07 AM Please see Amion for pager number during day hours 7:00am-4:30pm

## 2021-10-31 NOTE — Progress Notes (Signed)
Physical Therapy Treatment Patient Details Name: [name] MRN: [id number] DOB: 1956-02-26 Today's Date: 10/31/2021   History of Present Illness 66 y/o F reports diagnosis of PNA on 4/16 Admitted 4/17 to [facility], ICU with acute hypoxic/hypercarbic respiratory failure, failed BiPAP and required mechanical ventilation, transferred to [facility] 4/21 due to new finding of LV dysfunction Echo 4/20 with EF 40% , akinesis of apex and septal wall. . Failed weaning attempt 4/28. Trach placed on 5/1. PMH significant for COPD, tobacco use, HLD, GRED, and uncontrolled anxiety.    PT Comments    Patient was found soiled after BM. Patient able to roll L/R x 2-3 for pericare and linen change. Patient was able to tolerate 45+ minutes of activity during session which shows improving activity tolerance. Able to sit EOB x ~20 minutes but requires at least 1 UE support to maintain sitting balance due to truncal weakness and poor dynamic control. Patient defers transfer to chair this session due to fatigue. Continue to recommend acute inpatient rehab (AIR) for post-acute therapy needs.     Recommendations for follow up therapy are one component of a multi-disciplinary discharge planning process, led by the attending physician.  Recommendations may be updated based on patient status, additional functional criteria and insurance authorization.  Follow Up Recommendations  Acute inpatient rehab (3hours/day)     Assistance Recommended at Discharge Frequent or constant Supervision/Assistance  Patient can return home with the following Two people to help with walking and/or transfers;Two people to help with bathing/dressing/bathroom;Assist for transportation;Help with stairs or ramp for entrance   Equipment Recommendations  BSC/3in1;Wheelchair (measurements PT);Wheelchair cushion (measurements PT);Other (comment);Hospital bed (hoyer lift)    Recommendations for Other Services       Precautions / Restrictions  Precautions Precautions: Fall Precaution Comments: trach, cortrak Restrictions Weight Bearing Restrictions: No     Mobility  Bed Mobility Overal bed mobility: Needs Assistance Bed Mobility: Rolling, Supine to Sit, Sit to Supine Rolling: Min assist   Supine to sit: Mod assist, +2 for safety/equipment Sit to supine: Mod assist   General bed mobility comments: Able to roll L/R with minA for pericare as patient was found in BM. Assist for bringing LEs to EOB and trunk elevation as well as LE management to return to supine    Transfers                   General transfer comment: deferred due to fatigue    Ambulation/Gait                   Stairs             Wheelchair Mobility    Modified Rankin (Stroke Patients Only)       Balance Overall balance assessment: Needs assistance Sitting-balance support: Single extremity supported, Feet supported Sitting balance-Leahy Scale: Poor Sitting balance - Comments: Unable to sit unsupported without at least single UE support. Poor dynamic trunk control Postural control: Posterior lean                                  Cognition Arousal/Alertness: Awake/alert Behavior During Therapy: WFL for tasks assessed/performed, Anxious Overall Cognitive Status: Within Functional Limits for tasks assessed                                 General Comments: anxious with mobility but  follows commands with increased time and cues        Exercises General Exercises - Lower Extremity Ankle Circles/Pumps: AAROM, Both, Seated Long Arc Quad: AAROM, Both, 5 reps, Seated Hip Flexion/Marching: AAROM, Both, 5 reps, Seated    General Comments General comments (skin integrity, edema, etc.): VSS on 10L FiO2 40%      Pertinent Vitals/Pain Pain Assessment Pain Assessment: No/denies pain Pain Intervention(s): Monitored during session    Home Living                          Prior  Function            PT Goals (current goals can now be found in the care plan section) Acute Rehab PT Goals PT Goal Formulation: With patient Time For Goal Achievement: 11/06/21 Potential to Achieve Goals: Fair Progress towards PT goals: Progressing toward goals    Frequency    Min 3X/week      PT Plan Current plan remains appropriate    Co-evaluation PT/OT/SLP Co-Evaluation/Treatment: Yes Reason for Co-Treatment: Complexity of the patient's impairments (multi-system involvement);For patient/therapist safety PT goals addressed during session: Balance;Mobility/safety with mobility OT goals addressed during session: ADL's and self-care;Strengthening/ROM      AM-PAC PT "6 Clicks" Mobility   Outcome Measure  Help needed turning from your back to your side while in a flat bed without using bedrails?: A Little Help needed moving from lying on your back to sitting on the side of a flat bed without using bedrails?: A Lot Help needed moving to and from a bed to a chair (including a wheelchair)?: Total Help needed standing up from a chair using your arms (e.g., wheelchair or bedside chair)?: Total Help needed to walk in hospital room?: Total Help needed climbing 3-5 steps with a railing? : Total 6 Click Score: 9    End of Session Equipment Utilized During Treatment: Oxygen Activity Tolerance: Patient tolerated treatment well Patient left: in bed;with call bell/phone within reach;with bed alarm set (in chair position) Nurse Communication: Mobility status;Need for lift equipment PT Visit Diagnosis: Other abnormalities of gait and mobility (R26.89);Muscle weakness (generalized) (M62.81);Difficulty in walking, not elsewhere classified (R26.2)     Time: [id number] PT Time Calculation (min) (ACUTE ONLY): 54 min  Charges:  $Therapeutic Exercise: 8-22 mins $Therapeutic Activity: 8-22 mins                     Cesia Orf A. Gilford Rile PT, DPT Acute Rehabilitation Services Pager  [phone] Office [phone]    Linna Hoff 10/31/2021, 4:53 PM

## 2021-10-31 NOTE — Progress Notes (Signed)
CPT and nebulizer treatment held at this time. Pt endorses pain and refused therapies at this time. No distress noted. No further needs from RT request by the pt at this time. RT will continue to monitor and be available as needed.

## 2021-11-01 ENCOUNTER — Encounter (HOSPITAL_COMMUNITY): Payer: Medicare Other

## 2021-11-01 ENCOUNTER — Inpatient Hospital Stay (HOSPITAL_COMMUNITY): Payer: Medicare Other

## 2021-11-01 DIAGNOSIS — I5022 Chronic systolic (congestive) heart failure: Secondary | ICD-10-CM | POA: Diagnosis not present

## 2021-11-01 DIAGNOSIS — G9340 Encephalopathy, unspecified: Secondary | ICD-10-CM | POA: Diagnosis not present

## 2021-11-01 DIAGNOSIS — R739 Hyperglycemia, unspecified: Secondary | ICD-10-CM | POA: Diagnosis not present

## 2021-11-01 DIAGNOSIS — J9601 Acute respiratory failure with hypoxia: Secondary | ICD-10-CM | POA: Diagnosis not present

## 2021-11-01 LAB — GLUCOSE, CAPILLARY
Glucose-Capillary: 103 mg/dL — ABNORMAL HIGH (ref 70–99)
Glucose-Capillary: 104 mg/dL — ABNORMAL HIGH (ref 70–99)
Glucose-Capillary: 129 mg/dL — ABNORMAL HIGH (ref 70–99)
Glucose-Capillary: 92 mg/dL (ref 70–99)
Glucose-Capillary: 93 mg/dL (ref 70–99)
Glucose-Capillary: 94 mg/dL (ref 70–99)

## 2021-11-01 LAB — BASIC METABOLIC PANEL
Anion gap: 4 — ABNORMAL LOW (ref 5–15)
BUN: 14 mg/dL (ref 8–23)
CO2: 28 mmol/L (ref 22–32)
Calcium: 8.2 mg/dL — ABNORMAL LOW (ref 8.9–10.3)
Chloride: 99 mmol/L (ref 98–111)
Creatinine, Ser: <0.3 mg/dL — ABNORMAL LOW (ref 0.44–1.00)
Glucose, Bld: 80 mg/dL (ref 70–99)
Potassium: 3.7 mmol/L (ref 3.5–5.1)
Sodium: 131 mmol/L — ABNORMAL LOW (ref 135–145)

## 2021-11-01 LAB — CBC
HCT: 26.4 % — ABNORMAL LOW (ref 36.0–46.0)
Hemoglobin: 8.4 g/dL — ABNORMAL LOW (ref 12.0–15.0)
MCH: 31.6 pg (ref 26.0–34.0)
MCHC: 31.8 g/dL (ref 30.0–36.0)
MCV: 99.2 fL (ref 80.0–100.0)
Platelets: 259 10*3/uL (ref 150–400)
RBC: 2.66 MIL/uL — ABNORMAL LOW (ref 3.87–5.11)
RDW: 15.5 % (ref 11.5–15.5)
WBC: 15.9 10*3/uL — ABNORMAL HIGH (ref 4.0–10.5)
nRBC: 0 % (ref 0.0–0.2)

## 2021-11-01 LAB — MAGNESIUM: Magnesium: 1.9 mg/dL (ref 1.7–2.4)

## 2021-11-01 MED ORDER — BISACODYL 10 MG RE SUPP
10.0000 mg | Freq: Every day | RECTAL | Status: DC
  Administered 2021-11-05 – 2021-11-10 (×5): 10 mg via RECTAL
  Filled 2021-11-01 (×6): qty 1

## 2021-11-01 MED ORDER — POLYETHYLENE GLYCOL 3350 17 G PO PACK
17.0000 g | PACK | Freq: Two times a day (BID) | ORAL | Status: DC
  Administered 2021-11-04 – 2021-11-05 (×3): 17 g
  Filled 2021-11-01 (×6): qty 1

## 2021-11-01 MED ORDER — CEFAZOLIN SODIUM-DEXTROSE 2-4 GM/100ML-% IV SOLN: 2.0000 g | INTRAVENOUS | Status: AC

## 2021-11-01 MED ORDER — ENOXAPARIN SODIUM 40 MG/0.4ML IJ SOSY
40.0000 mg | PREFILLED_SYRINGE | INTRAMUSCULAR | Status: DC
  Administered 2021-11-01 – 2021-11-14 (×14): 40 mg via SUBCUTANEOUS
  Filled 2021-11-01 (×14): qty 0.4

## 2021-11-01 NOTE — Progress Notes (Signed)
Brief Nutrition Follow-up:  Noted diet advanced to [place]. Pt taking sips but not much. Encouraged pt to sip and take small bites throughout the day to stimulate gut.   Noted plan for PEG tube still pending. If there will be a prolonged period of time before PEG can be placed, recommend reinserting Cortrak tube.   Romelle Starcher MS, RDN, LDN, CNSC Registered Dietitian III Clinical Nutrition RD Pager and On-Call Pager Number Located in [place]

## 2021-11-01 NOTE — Progress Notes (Signed)
   11/01/21 1438  Clinical Encounter Type  Visited With Patient;Health care provider  Visit Type Follow-up  Referral From Other (Comment) (spiritual consult)  Consult/Referral To Chaplain  Recommendations HCPOA READY FOR NOTERY   Chaplain spoke with nurse Philipp Deputy and also explained to patient that once a notary has been secured, the HCPOA will be executed.  Note: The HCPOA has been reviewed and is ready.  Jon Gills, Resident Chaplain [phone]

## 2021-11-01 NOTE — Progress Notes (Signed)
PROGRESS NOTE    [name]  EZM:[id number] DOB: 1955-06-11 DOA: 09/24/2021 PCP: Marva Panda, NP   Brief Narrative:  [name] is a 66 y.o. female with PMH significant for GOLD 3 COPD, quit smoking 2021, uncontrolled anxiety. Patient presented to ED on 4/17 with complaint of difficulty breathing, cough, wheezing, fatigue with symptoms worsening for 1 week. EMS noted hypoxia. Patient required BiPAP in the ED and was admitted to hospitalist service, however her respiratory status continued to worsen. On 4/19, she was intubated and transferred to ICU. She was treated for pneumonia and was found to have  newly diagnosed acute systolic heart failure. Patient was unable to wean off the ventilator successfully and underwent tracheostomy placement on 5/1. She was transferred out of the ICU on 5/15. She is currently receiving tube feeds via NG tube with plans for PEG tube placement.   Assessment and Plan:  Acute respiratory failure with hypoxia and hypercapnia Tracheostomy/vent dependent respiratory failure Patient admitted initially with COPD exacerbation and managed on BiPAP. Worsening respiratory failure lead to [facility] consult and requirement for mechanical ventilation. Patient intubated on 4/19. Prolonged ventilator wean and so tracheostomy performed on 5/1. Patient having trouble controlling her secretions. Supplemental oxygen increased to 10 L/min -Continue trach care per PCCM -Continue chest physiotherapy and hypertonic saline -Wean oxygen as able  Abdominal distension Partial SBO vs Ileus New issue. Patient with decreased flatus per her report in addition to infrequent bowel movements, even on tube feeds. Abdominal x-ray ordered 5/23 which is concerning for partial SBO. Associated abdominal pain. CT abdomen/pelvis confirms concern for partial SBO but also shows contrast from prior CT retained in the colon. General surgery consulted and are leaning more towards ileus diagnosis.  Symptoms appear improved today.  -General surgery recommendations: Full liquid diet today  Leukocytosis Unsure if etiology at this time but recent CT chest significant for multiple nodules concerning for infection. Recently completed antibiotics. Trending down after recent WBC peak of 21,000. -Daily CBC for now  Pulmonary nodules Concerning for infection. Most recent blood cultures (5/17) with no growth. Procalcitonin undetectable. Afebrile. Repeat blood cultures (5/23) are no growth to date. -Follow-up blood culture -Follow-up CT chest w/contrast in 3 months  Hospital acquired pneumonia, resolved. Tracheal aspirate (4/27) significant for klebsiella pneumoniae infection. Patient completed 6 days of antibiotic therapy with Ceftriaxone/Cefepime.  COPD exacerbation Initial problem which led to acute respiratory failure and mechanical ventilation. Patient treated with Steroids and nebulizer treatments. Exacerbation resolved. -Continue Brovana, Pulmicort scheduled and Xopenex PRN  Acute systolic heart failure LVEF of 40% on recent Transthoracic Echocardiogram with focal WMA. Cardiology consulted with recommendation for outpatient ischemic evaluation.  Acute metabolic encephalopathy Resolved. Managed with Seroquel which is being decreased.  Severe anxiety Continue Klonopin and Seroquel  Thrombocytopenia Transient in setting of acute illness. Improved.  Severe malnutrition Cortrak feeding tube placed on 4/28. Speech therapy on board. Unable to wean off tube feeds, so plan is for placement of a PEG tube. Patient advanced to a regular diet on 5/11. Tube feeds transitioned to overnight and are now held secondary to recent vomiting. Cortrak removed secondary to clogging. -Will trial off of enteral nutrition over the weekend  Hyperlipidemia -Continue Crestor  Hyperglycemia Hemoglobin A1C of 5.4%  Sepsis Not present on admission. Secondary to recurrent pneumonia. No bacteremia. Now  resolved.   DVT prophylaxis: Lovenox Code Status:   Code Status: Full Code Family Communication: None at bedside Disposition Plan: Discharge hopefully to CIR once oxygen is weaned  Consultants:  PCCM Cardiology  Procedures:  Transthoracic Echocardiogram (09/27/21)  Antimicrobials: Ceftriaxone Cefepime    Subjective: Patient reports being very hungry today. Some abdominal pain that is mild. She reports no bowel movement overnight, although an occurrence is listed in her flowsheets. Passing gas.  Objective: BP 115/64 (BP Location: Left Arm)   Pulse 90   Temp 98 F (36.7 C)   Resp 19   Ht 5\' 1"  (1.549 m)   Wt 49.4 kg   SpO2 96%   BMI 20.58 kg/m   Examination:  General exam: Appears calm and comfortable Respiratory system: Clear to auscultation. Respiratory effort normal. Cardiovascular system: S1 & S2 heard, RRR. No murmurs, rubs, gallops or clicks. Gastrointestinal system: Abdomen is mildly distended, soft and non-tender. Normal bowel sounds heard. Central nervous system: Alert and oriented. No focal neurological deficits. Musculoskeletal: No edema. No calf tenderness Psychiatry: Judgement and insight appear normal. Mood & affect appropriate.    Data Reviewed: I have personally reviewed following labs and imaging studies  CBC Lab Results  Component Value Date   WBC 15.9 (H) 11/01/2021   RBC 2.66 (L) 11/01/2021   HGB 8.4 (L) 11/01/2021   HCT 26.4 (L) 11/01/2021   MCV 99.2 11/01/2021   MCH 31.6 11/01/2021   PLT 259 11/01/2021   MCHC 31.8 11/01/2021   RDW 15.5 11/01/2021   LYMPHSABS 2.1 10/30/2021   MONOABS 1.5 (H) 10/30/2021   EOSABS 0.0 10/30/2021   BASOSABS 0.1 [id number]     Last metabolic panel Lab Results  Component Value Date   NA 131 (L) 11/01/2021   K 3.7 11/01/2021   CL 99 11/01/2021   CO2 28 11/01/2021   BUN 14 11/01/2021   CREATININE <0.30 (L) 11/01/2021   GLUCOSE 80 11/01/2021   GFRNONAA NOT CALCULATED 11/01/2021   GFRAA >60  05/14/2019   CALCIUM 8.2 (L) 11/01/2021   PHOS 4.0 10/25/2021   PROT 6.3 (L) 10/27/2021   ALBUMIN 1.9 (L) 10/27/2021   BILITOT 0.4 10/27/2021   ALKPHOS 200 (H) 10/27/2021   AST 38 10/27/2021   ALT 86 (H) 10/27/2021   ANIONGAP 4 (L) 11/01/2021    GFR: CrCl cannot be calculated (This lab value cannot be used to calculate CrCl because it is not a number: <0.30).  Recent Results (from the past 240 hour(s))  Culture, blood (x 2)     Status: None   Collection Time: 10/24/21  4:25 AM   Specimen: BLOOD LEFT HAND  Result Value Ref Range Status   Specimen Description BLOOD LEFT HAND  Final   Special Requests   Final    BOTTLES DRAWN AEROBIC ONLY Blood Culture adequate volume   Culture   Final    NO GROWTH 5 DAYS Performed at [facility], [address], [place], [place] [address]    Report Status 10/29/2021 FINAL  Final  Culture, blood (x 2)     Status: None   Collection Time: 10/24/21  4:25 AM   Specimen: BLOOD LEFT ARM  Result Value Ref Range Status   Specimen Description BLOOD LEFT ARM  Final   Special Requests   Final    BOTTLES DRAWN AEROBIC ONLY Blood Culture adequate volume   Culture   Final    NO GROWTH 5 DAYS Performed at [facility], [place] [address] [place], [place] [address]    Report Status 10/29/2021 FINAL  Final  MRSA Next Gen by PCR, Nasal     Status: None   Collection Time:  10/24/21  5:05 AM  Result Value Ref Range Status   MRSA by PCR Next Gen NOT DETECTED NOT DETECTED Final    Comment: (NOTE) The GeneXpert MRSA Assay (FDA approved for NASAL specimens only), is one component of a comprehensive MRSA colonization surveillance program. It is not intended to diagnose MRSA infection nor to guide or monitor treatment for MRSA infections. Test performance is not FDA approved in patients less than 58 years old. Performed at [facility], [place] [address] [place], [place] [address]   Culture, blood (Routine X 2) w Reflex to ID Panel     Status:  None (Preliminary result)   Collection Time: 10/30/21  1:56 PM   Specimen: BLOOD  Result Value Ref Range Status   Specimen Description BLOOD RIGHT ANTECUBITAL  Final   Special Requests   Final    BOTTLES DRAWN AEROBIC AND ANAEROBIC Blood Culture results may not be optimal due to an inadequate volume of blood received in culture bottles   Culture   Final    NO GROWTH 2 DAYS Performed at [facility], [place] [address] [place], [place] [address]    Report Status PENDING  Incomplete  Culture, blood (Routine X 2) w Reflex to ID Panel     Status: None (Preliminary result)   Collection Time: 10/30/21  1:59 PM   Specimen: BLOOD  Result Value Ref Range Status   Specimen Description BLOOD RIGHT ANTECUBITAL  Final   Special Requests   Final    BOTTLES DRAWN AEROBIC AND ANAEROBIC Blood Culture adequate volume   Culture   Final    NO GROWTH 2 DAYS Performed at [facility], [place] [address], [place], [place] [address]    Report Status PENDING  Incomplete      Radiology Studies: CT ABDOMEN PELVIS W CONTRAST  Result Date: 10/30/2021 CLINICAL DATA:  Acute generalized abdominal pain. EXAM: CT ABDOMEN AND PELVIS WITH CONTRAST TECHNIQUE: Multidetector CT imaging of the abdomen and pelvis was performed using the standard protocol following bolus administration of intravenous contrast. RADIATION DOSE REDUCTION: This exam was performed according to the departmental dose-optimization program which includes automated exposure control, adjustment of the mA and/or kV according to patient size and/or use of iterative reconstruction technique. CONTRAST:  140mL OMNIPAQUE IOHEXOL 300 MG/ML  SOLN COMPARISON:  Oct 27, 2021.  Oct 28, 2021. FINDINGS: Lower chest: Grossly stable nodular densities are noted in right lower lobe as noted on CT scan of Oct 28, 2021. Hepatobiliary: Hepatic steatosis. No gallstones or biliary dilatation is noted. Pancreas: Unremarkable. No pancreatic ductal dilatation or surrounding  inflammatory changes. Spleen: Normal in size without focal abnormality. Adrenals/Urinary Tract: Adrenal glands and kidneys appear normal. Status post cystectomy with ileal conduit placement. Stomach/Bowel: Distal tip of feeding tube is seen in distal stomach. Mild gastric distention is noted. Stool and contrast is noted throughout the colon. Dilated small bowel loops are noted which extend to the pelvis near surgical anastomotic site, concerning for possible partial obstruction. Vascular/Lymphatic: Aortic atherosclerosis. No enlarged abdominal or pelvic lymph nodes. Reproductive: Status post hysterectomy. No adnexal masses. Other: No hernia is noted. Small amount of free fluid is noted in the pelvis of uncertain significance. Musculoskeletal: No acute or significant osseous findings. IMPRESSION: Dilated small bowel loops are noted which extend to the pelvis near surgical anastomotic site, concerning for partial obstruction. Stool and contrast are noted throughout the nondilated colon. Patient is status post cystectomy and ileal conduit placement. Hepatic steatosis. Small amount  of free fluid is noted in the pelvis of uncertain significance. Distal tip of feeding tube is seen in distal stomach. Mild gastric distention is noted. Aortic Atherosclerosis (ICD10-I70.0). Electronically Signed   By: Marijo Conception M.D.   On: 10/30/2021 15:13   DG Abd Portable 1V  Result Date: 11/01/2021 CLINICAL DATA:  Small bowel obstruction. EXAM: PORTABLE ABDOMEN - 1 VIEW COMPARISON:  Oct 31, 2021. FINDINGS: The bowel gas pattern is normal. No radio-opaque calculi or other significant radiographic abnormality are seen. IMPRESSION: Negative. Electronically Signed   By: Marijo Conception M.D.   On: 11/01/2021 08:27   DG Abd Portable 1V  Result Date: 10/31/2021 CLINICAL DATA:  Small bowel obstruction. EXAM: PORTABLE ABDOMEN - 1 VIEW COMPARISON:  Radiograph 10/30/2021 FINDINGS: Interval decrease in prominence of central small bowel  lobes. Loops measure less than 3 cm. Moderate volume stool in the descending colon and rectum. Gas in the rectum. IMPRESSION: Improving small bowel obstruction pattern. Electronically Signed   By: Suzy Bouchard M.D.   On: 10/31/2021 09:46   DG Abd Portable 1V  Result Date: 10/30/2021 CLINICAL DATA:  abdominal distention EXAM: Portable AP supine view of the abdomen COMPARISON:  Abdominal x-ray from Oct 21, 2021 FINDINGS: Again seen is the feeding tube with its tip at the gastric pylorus. Mild gaseous distention of the small bowel loops, with air distended small bowel loop measuring 4.2 cm in greatest transverse diameter at the right lower abdomen and has increased in the interim. There is some air seen in the large bowel loops as well. No radio-opaque calculi or other significant radiographic abnormality are seen. IMPRESSION: Tip of the enteric tube terminates at the pylorus. Mild gaseous distention of the small bowel loops on the basis of likely partial small bowel obstruction and has increased in the interim. Electronically Signed   By: Frazier Richards M.D.   On: 10/30/2021 11:19      LOS: 57 days    Cordelia Poche, MD [facility] 11/01/2021, 11:09 AM   If 7PM-7AM, please contact night-coverage [email or web address]

## 2021-11-01 NOTE — Progress Notes (Signed)
   11/01/21 1740  Clinical Encounter Type  Visited With Patient and family together;Health care provider  Visit Type Follow-up Caryl Pina H. Carlis Abbott, RN)  Referral From Nurse Caryl Pina H. Carlis Abbott, RN)  Consult/Referral To Chaplain Albertina Parr Morene Crocker)   Patient's nurse, Caryl Pina. Andrey Cota, RN stated that patient's Advance Directive was ready for Notary, but Part B: Living Will was not completed. Patient's sister will return tomorrow and complete. [address] [place], [place] [phone]

## 2021-11-01 NOTE — Progress Notes (Signed)
PT refused vest therapy

## 2021-11-01 NOTE — Progress Notes (Signed)
Progress Note     Subjective: Pt reports BM overnight and feeling better overall. Still having some intermittent bloating but no nausea. No emesis.   Objective: Vital signs in last 24 hours: Temp:  [97.5 F (36.4 C)-98.3 F (36.8 C)] 98 F (36.7 C) (05/25 0902) Pulse Rate:  [80-97] 90 (05/25 0902) Resp:  [17-22] 19 (05/25 0902) BP: (106-116)/(59-68) 115/64 (05/25 0902) SpO2:  [92 %-100 %] 96 % (05/25 0902) FiO2 (%):  [40 %] 40 % (05/25 0722) Weight:  [49.4 kg] 49.4 kg (05/25 0448) Last BM Date : 10/31/21  Intake/Output from previous day: 05/24 0701 - 05/25 0700 In: 1933.7 [P.O.:120; I.V.:1813.7] Out: 1000 [Urine:1000] Intake/Output this shift: No intake/output data recorded.  PE: General: pleasant, WD, chronically ill appearing female who is laying in bed in NAD Heart: regular, rate, and rhythm.   Lungs:  Respiratory effort nonlabored Abd: soft, NT, mild distention, +BS Psych: A&Ox3 with an appropriate affect.    Lab Results:  Recent Labs    10/31/21 0445 11/01/21 0343  WBC 20.1* 15.9*  HGB 9.4* 8.4*  HCT 30.1* 26.4*  PLT 335 259   BMET Recent Labs    10/31/21 0445 11/01/21 0343  NA 140 131*  K 4.0 3.7  CL 98 99  CO2 34* 28  GLUCOSE 103* 80  BUN 18 14  CREATININE 0.32* <0.30*  CALCIUM 9.0 8.2*   PT/INR No results for input(s): LABPROT, INR in the last 72 hours. CMP     Component Value Date/Time   NA 131 (L) 11/01/2021 0343   K 3.7 11/01/2021 0343   CL 99 11/01/2021 0343   CO2 28 11/01/2021 0343   GLUCOSE 80 11/01/2021 0343   BUN 14 11/01/2021 0343   CREATININE <0.30 (L) 11/01/2021 0343   CALCIUM 8.2 (L) 11/01/2021 0343   PROT 6.3 (L) 10/27/2021 0432   ALBUMIN 1.9 (L) 10/27/2021 0432   AST 38 10/27/2021 0432   ALT 86 (H) 10/27/2021 0432   ALKPHOS 200 (H) 10/27/2021 0432   BILITOT 0.4 10/27/2021 0432   GFRNONAA NOT CALCULATED 11/01/2021 0343   GFRAA >60 05/14/2019 1651   Lipase     Component Value Date/Time   LIPASE 23 05/14/2019  1651       Studies/Results: CT ABDOMEN PELVIS W CONTRAST  Result Date: 10/30/2021 CLINICAL DATA:  Acute generalized abdominal pain. EXAM: CT ABDOMEN AND PELVIS WITH CONTRAST TECHNIQUE: Multidetector CT imaging of the abdomen and pelvis was performed using the standard protocol following bolus administration of intravenous contrast. RADIATION DOSE REDUCTION: This exam was performed according to the departmental dose-optimization program which includes automated exposure control, adjustment of the mA and/or kV according to patient size and/or use of iterative reconstruction technique. CONTRAST:  118mL OMNIPAQUE IOHEXOL 300 MG/ML  SOLN COMPARISON:  Oct 27, 2021.  Oct 28, 2021. FINDINGS: Lower chest: Grossly stable nodular densities are noted in right lower lobe as noted on CT scan of Oct 28, 2021. Hepatobiliary: Hepatic steatosis. No gallstones or biliary dilatation is noted. Pancreas: Unremarkable. No pancreatic ductal dilatation or surrounding inflammatory changes. Spleen: Normal in size without focal abnormality. Adrenals/Urinary Tract: Adrenal glands and kidneys appear normal. Status post cystectomy with ileal conduit placement. Stomach/Bowel: Distal tip of feeding tube is seen in distal stomach. Mild gastric distention is noted. Stool and contrast is noted throughout the colon. Dilated small bowel loops are noted which extend to the pelvis near surgical anastomotic site, concerning for possible partial obstruction. Vascular/Lymphatic: Aortic atherosclerosis. No enlarged abdominal or pelvic  lymph nodes. Reproductive: Status post hysterectomy. No adnexal masses. Other: No hernia is noted. Small amount of free fluid is noted in the pelvis of uncertain significance. Musculoskeletal: No acute or significant osseous findings. IMPRESSION: Dilated small bowel loops are noted which extend to the pelvis near surgical anastomotic site, concerning for partial obstruction. Stool and contrast are noted throughout the  nondilated colon. Patient is status post cystectomy and ileal conduit placement. Hepatic steatosis. Small amount of free fluid is noted in the pelvis of uncertain significance. Distal tip of feeding tube is seen in distal stomach. Mild gastric distention is noted. Aortic Atherosclerosis (ICD10-I70.0). Electronically Signed   By: Marijo Conception M.D.   On: 10/30/2021 15:13   DG Abd Portable 1V  Result Date: 11/01/2021 CLINICAL DATA:  Small bowel obstruction. EXAM: PORTABLE ABDOMEN - 1 VIEW COMPARISON:  Oct 31, 2021. FINDINGS: The bowel gas pattern is normal. No radio-opaque calculi or other significant radiographic abnormality are seen. IMPRESSION: Negative. Electronically Signed   By: Marijo Conception M.D.   On: 11/01/2021 08:27   DG Abd Portable 1V  Result Date: 10/31/2021 CLINICAL DATA:  Small bowel obstruction. EXAM: PORTABLE ABDOMEN - 1 VIEW COMPARISON:  Radiograph 10/30/2021 FINDINGS: Interval decrease in prominence of central small bowel lobes. Loops measure less than 3 cm. Moderate volume stool in the descending colon and rectum. Gas in the rectum. IMPRESSION: Improving small bowel obstruction pattern. Electronically Signed   By: Suzy Bouchard M.D.   On: 10/31/2021 09:46   DG Abd Portable 1V  Result Date: 10/30/2021 CLINICAL DATA:  abdominal distention EXAM: Portable AP supine view of the abdomen COMPARISON:  Abdominal x-ray from Oct 21, 2021 FINDINGS: Again seen is the feeding tube with its tip at the gastric pylorus. Mild gaseous distention of the small bowel loops, with air distended small bowel loop measuring 4.2 cm in greatest transverse diameter at the right lower abdomen and has increased in the interim. There is some air seen in the large bowel loops as well. No radio-opaque calculi or other significant radiographic abnormality are seen. IMPRESSION: Tip of the enteric tube terminates at the pylorus. Mild gaseous distention of the small bowel loops on the basis of likely partial small  bowel obstruction and has increased in the interim. Electronically Signed   By: Frazier Richards M.D.   On: 10/30/2021 11:19    Anti-infectives: Anti-infectives (From admission, onward)    Start     Dose/Rate Route Frequency Ordered Stop   11/02/21 0000  ceFAZolin (ANCEF) IVPB 2g/100 mL premix        2 g 200 mL/hr over 30 Minutes Intravenous To Radiology 11/01/21 0957 11/03/21 0000   10/25/21 0400  vancomycin (VANCOCIN) IVPB 1000 mg/200 mL premix  Status:  Discontinued        1,000 mg 200 mL/hr over 60 Minutes Intravenous Every 24 hours 10/24/21 0411 10/25/21 1513   10/24/21 1600  ceFEPIme (MAXIPIME) 2 g in sodium chloride 0.9 % 100 mL IVPB  Status:  Discontinued        2 g 200 mL/hr over 30 Minutes Intravenous Every 12 hours 10/24/21 0411 10/29/21 1457   10/24/21 0430  ceFEPIme (MAXIPIME) 2 g in sodium chloride 0.9 % 100 mL IVPB        2 g 200 mL/hr over 30 Minutes Intravenous  Once 10/24/21 0343 10/24/21 0719   10/24/21 0400  vancomycin (VANCOREADY) IVPB 1250 mg/250 mL        1,250 mg 166.7 mL/hr over 90  Minutes Intravenous  Once 10/24/21 0343 10/24/21 0603   10/06/21 1330  cefTRIAXone (ROCEPHIN) 2 g in sodium chloride 0.9 % 100 mL IVPB        2 g 200 mL/hr over 30 Minutes Intravenous Every 24 hours 10/06/21 1242 10/08/21 1529   10/04/21 0900  ceFEPIme (MAXIPIME) 2 g in sodium chloride 0.9 % 100 mL IVPB  Status:  Discontinued        2 g 200 mL/hr over 30 Minutes Intravenous Every 12 hours 10/04/21 0816 10/06/21 1242   10/03/21 0900  cefTRIAXone (ROCEPHIN) 1 g in sodium chloride 0.9 % 100 mL IVPB  Status:  Discontinued        1 g 200 mL/hr over 30 Minutes Intravenous Every 24 hours 10/03/21 0819 10/04/21 0816   09/25/21 2030  azithromycin (ZITHROMAX) tablet 500 mg       See Hyperspace for full Linked Orders Report.   500 mg Oral Daily 09/24/21 2020 09/28/21 0829   09/24/21 2020  azithromycin (ZITHROMAX) 500 mg in sodium chloride 0.9 % 250 mL IVPB       See Hyperspace for full Linked  Orders Report.   500 mg 250 mL/hr over 60 Minutes Intravenous Every 24 hours 09/24/21 2020 09/24/21 2225        Assessment/Plan Partial SBO vs ileus - hx Cystectomy and ileal neo-bladder 2006, transabdominal and transvaginal fistula repair 2006, hysterectomy - CT 5/23 shows dilated small bowel loops in the pelvis near surgical anastomotic site concerning for partial obstruction; small amount of free fluid is noted in the pelvis of uncertain significance; mild gastric distension; stool and contrast noted throughout the nondilated colon - Patient states that she is feeling better overall. Tolerating sips and had a BM overnight. She reports issues with constipation more chronically as well.  - ok to have FLD as tolerated today - reordered suppository for daily and increased daily miralax to BID - discussed with IR, could potentially consider g-tube placement tomorrow still if continuing to have bowel function and no n/v with FLD. Although G-tube would also be able to be used for venting if needed intermittently for ileus    ID - none currently VTE - lovenox FEN - FLD Foley - none   COPD exacerbation Acute respiratory failure with hypoxia and hypercapnia Tracheostomy/vent dependent respiratory failure Pulmonary nodules Hospital acquired PNA - completed abx Acute systolic heart failure Severe malnutrition HLD  LOS: 37 days     Norm Parcel, [facility] 11/01/2021, 10:05 AM Please see Amion for pager number during day hours 7:00am-4:30pm

## 2021-11-01 NOTE — Progress Notes (Signed)
RT holding CPT at this time per patient request. Patient states that she is in significant pain and does not want CPT. Patient is not in distress and tolerated breathing treatments. RT will continue to monitor.

## 2021-11-02 DIAGNOSIS — J9601 Acute respiratory failure with hypoxia: Secondary | ICD-10-CM | POA: Diagnosis not present

## 2021-11-02 DIAGNOSIS — I5022 Chronic systolic (congestive) heart failure: Secondary | ICD-10-CM | POA: Diagnosis not present

## 2021-11-02 DIAGNOSIS — G9340 Encephalopathy, unspecified: Secondary | ICD-10-CM | POA: Diagnosis not present

## 2021-11-02 DIAGNOSIS — R739 Hyperglycemia, unspecified: Secondary | ICD-10-CM | POA: Diagnosis not present

## 2021-11-02 LAB — CBC
HCT: 25.6 % — ABNORMAL LOW (ref 36.0–46.0)
Hemoglobin: 8.4 g/dL — ABNORMAL LOW (ref 12.0–15.0)
MCH: 32.3 pg (ref 26.0–34.0)
MCHC: 32.8 g/dL (ref 30.0–36.0)
MCV: 98.5 fL (ref 80.0–100.0)
Platelets: 251 10*3/uL (ref 150–400)
RBC: 2.6 MIL/uL — ABNORMAL LOW (ref 3.87–5.11)
RDW: 15.3 % (ref 11.5–15.5)
WBC: 15.3 10*3/uL — ABNORMAL HIGH (ref 4.0–10.5)
nRBC: 0 % (ref 0.0–0.2)

## 2021-11-02 LAB — PROTIME-INR
INR: 1.1 (ref 0.8–1.2)
Prothrombin Time: 14.2 seconds (ref 11.4–15.2)

## 2021-11-02 LAB — GLUCOSE, CAPILLARY
Glucose-Capillary: 96 mg/dL (ref 70–99)
Glucose-Capillary: 86 mg/dL (ref 70–99)
Glucose-Capillary: 151 mg/dL — ABNORMAL HIGH (ref 70–99)
Glucose-Capillary: 142 mg/dL — ABNORMAL HIGH (ref 70–99)
Glucose-Capillary: 113 mg/dL — ABNORMAL HIGH (ref 70–99)

## 2021-11-02 NOTE — Progress Notes (Signed)
PROGRESS NOTE    [name]  [id number] DOB: [birth year] DOA: 09/24/2021 PCP: Everardo Beals, NP   Brief Narrative:  [name] is a 66 y.o. female with PMH significant for GOLD 3 COPD, quit smoking 2021, uncontrolled anxiety. Patient presented to ED on 4/17 with complaint of difficulty breathing, cough, wheezing, fatigue with symptoms worsening for 1 week. EMS noted hypoxia. Patient required BiPAP in the ED and was admitted to hospitalist service, however her respiratory status continued to worsen. On 4/19, she was intubated and transferred to ICU. She was treated for pneumonia and was found to have  newly diagnosed acute systolic heart failure. Patient was unable to wean off the ventilator successfully and underwent tracheostomy placement on 5/1. She was transferred out of the ICU on 5/15. She is currently receiving tube feeds via NG tube with plans for PEG tube placement.   Assessment and Plan:  Acute respiratory failure with hypoxia and hypercapnia Tracheostomy/vent dependent respiratory failure Patient admitted initially with COPD exacerbation and managed on BiPAP. Worsening respiratory failure lead to [facility] consult and requirement for mechanical ventilation. Patient intubated on 4/19. Prolonged ventilator wean and so tracheostomy performed on 5/1. Patient having trouble controlling her secretions. Supplemental oxygen increased to 10 L/min -Continue trach care per PCCM -Continue chest physiotherapy and hypertonic saline per PCCM recommendations -Wean oxygen as able  Abdominal distension Partial SBO vs Ileus New issue. Patient with decreased flatus per her report in addition to infrequent bowel movements, even on tube feeds. Abdominal x-ray ordered 5/23 which is concerning for partial SBO. Associated abdominal pain. CT abdomen/pelvis confirms concern for partial SBO but also shows contrast from prior CT retained in the colon. General surgery consulted and are leaning more  towards ileus diagnosis. Symptoms appear improved.  Having bowel movements. -General surgery recommendations: Soft diet. Signing off.  Leukocytosis Unsure if etiology at this time but recent CT chest significant for multiple nodules concerning for infection. Recently completed antibiotics. Trending down after recent WBC peak of 21,000. -Daily CBC  Pulmonary nodules Concerning for infection. Most recent blood cultures (5/17) with no growth. Procalcitonin undetectable. Afebrile. Repeat blood cultures (5/23) are no growth to date. -Follow-up blood culture -Follow-up CT chest w/contrast in 3 months  Hospital acquired pneumonia, resolved. Tracheal aspirate (4/27) significant for klebsiella pneumoniae infection. Patient completed 6 days of antibiotic therapy with Ceftriaxone/Cefepime.  COPD exacerbation Initial problem which led to acute respiratory failure and mechanical ventilation. Patient treated with Steroids and nebulizer treatments. Exacerbation resolved. -Continue Brovana, Pulmicort scheduled and Xopenex PRN  Acute systolic heart failure LVEF of 40% on recent Transthoracic Echocardiogram with focal WMA. Cardiology consulted with recommendation for outpatient ischemic evaluation.  Acute metabolic encephalopathy Resolved. Managed with Seroquel which is being decreased.  Severe anxiety Continue Klonopin and Seroquel  Thrombocytopenia Transient in setting of acute illness. Improved.  Severe malnutrition Cortrak feeding tube placed on 4/28. Speech therapy on board. Unable to wean off tube feeds, so plan is for placement of a PEG tube. Patient advanced to a regular diet on 5/11. Tube feeds transitioned to overnight and are now held secondary to recent vomiting. Cortrak removed secondary to clogging. -Will trial off of enteral nutrition over the weekend -Oral diet -Dietitian recommendations  Hyperlipidemia -Continue Crestor  Hyperglycemia Hemoglobin A1C of 5.4%  Sepsis Not  present on admission. Secondary to recurrent pneumonia. No bacteremia. Now resolved.   DVT prophylaxis: Lovenox Code Status:   Code Status: Full Code Family Communication: None at bedside Disposition Plan: Discharge hopefully  to CIR once oxygen is weaned down to 5 L/min   Consultants:  PCCM Cardiology  Procedures:  Transthoracic Echocardiogram (09/27/21)  Antimicrobials: Ceftriaxone Cefepime    Subjective: Patient is happy now that she has received breakfast. No issues this morning. No abdominal pain.  Objective: BP 121/62 (BP Location: Left Arm)   Pulse (!) 105   Temp 98 F (36.7 C)   Resp 18   Ht 5\' 1"  (1.549 m)   Wt 52.9 kg   SpO2 93%   BMI 22.04 kg/m   Examination:  General exam: Appears calm and comfortable Respiratory system: Clear to auscultation. Respiratory effort normal. Cardiovascular system: S1 & S2 heard, RRR. No murmurs. Gastrointestinal system: Abdomen is nondistended, soft and nontender. Normal bowel sounds heard. Central nervous system: Alert and oriented. No focal neurological deficits. Musculoskeletal: No edema. No calf tenderness Skin: No cyanosis. No rashes Psychiatry: Judgement and insight appear normal. Mood & affect appropriate.    Data Reviewed: I have personally reviewed following labs and imaging studies  CBC Lab Results  Component Value Date   WBC 15.3 (H) 11/02/2021   RBC 2.60 (L) 11/02/2021   HGB 8.4 (L) 11/02/2021   HCT 25.6 (L) 11/02/2021   MCV 98.5 11/02/2021   MCH 32.3 11/02/2021   PLT 251 11/02/2021   MCHC 32.8 11/02/2021   RDW 15.3 11/02/2021   LYMPHSABS 2.1 10/30/2021   MONOABS 1.5 (H) 10/30/2021   EOSABS 0.0 10/30/2021   BASOSABS 0.1 [id number]     Last metabolic panel Lab Results  Component Value Date   NA 131 (L) 11/01/2021   K 3.7 11/01/2021   CL 99 11/01/2021   CO2 28 11/01/2021   BUN 14 11/01/2021   CREATININE <0.30 (L) 11/01/2021   GLUCOSE 80 11/01/2021   GFRNONAA NOT CALCULATED 11/01/2021    GFRAA >60 05/14/2019   CALCIUM 8.2 (L) 11/01/2021   PHOS 4.0 10/25/2021   PROT 6.3 (L) 10/27/2021   ALBUMIN 1.9 (L) 10/27/2021   BILITOT 0.4 10/27/2021   ALKPHOS 200 (H) 10/27/2021   AST 38 10/27/2021   ALT 86 (H) 10/27/2021   ANIONGAP 4 (L) 11/01/2021    GFR: CrCl cannot be calculated (This lab value cannot be used to calculate CrCl because it is not a number: <0.30).  Recent Results (from the past 240 hour(s))  Culture, blood (x 2)     Status: None   Collection Time: 10/24/21  4:25 AM   Specimen: BLOOD LEFT HAND  Result Value Ref Range Status   Specimen Description BLOOD LEFT HAND  Final   Special Requests   Final    BOTTLES DRAWN AEROBIC ONLY Blood Culture adequate volume   Culture   Final    NO GROWTH 5 DAYS Performed at [facility], [address] [place], [place] [address]    Report Status 10/29/2021 FINAL  Final  Culture, blood (x 2)     Status: None   Collection Time: 10/24/21  4:25 AM   Specimen: BLOOD LEFT ARM  Result Value Ref Range Status   Specimen Description BLOOD LEFT ARM  Final   Special Requests   Final    BOTTLES DRAWN AEROBIC ONLY Blood Culture adequate volume   Culture   Final    NO GROWTH 5 DAYS Performed at [facility], [place] [address], [place], [place] [address]    Report Status 10/29/2021 FINAL  Final  MRSA Next Gen by PCR, Nasal     Status: None   Collection  Time: 10/24/21  5:05 AM  Result Value Ref Range Status   MRSA by PCR Next Gen NOT DETECTED NOT DETECTED Final    Comment: (NOTE) The GeneXpert MRSA Assay (FDA approved for NASAL specimens only), is one component of a comprehensive MRSA colonization surveillance program. It is not intended to diagnose MRSA infection nor to guide or monitor treatment for MRSA infections. Test performance is not FDA approved in patients less than 60 years old. Performed at [facility], [place] [address], [place], [place] [address]   Culture, blood (Routine X 2) w Reflex to ID Panel      Status: None (Preliminary result)   Collection Time: 10/30/21  1:56 PM   Specimen: BLOOD  Result Value Ref Range Status   Specimen Description BLOOD RIGHT ANTECUBITAL  Final   Special Requests   Final    BOTTLES DRAWN AEROBIC AND ANAEROBIC Blood Culture results may not be optimal due to an inadequate volume of blood received in culture bottles   Culture   Final    NO GROWTH 3 DAYS Performed at [facility], [place] [address], [place], [place] [address]    Report Status PENDING  Incomplete  Culture, blood (Routine X 2) w Reflex to ID Panel     Status: None (Preliminary result)   Collection Time: 10/30/21  1:59 PM   Specimen: BLOOD  Result Value Ref Range Status   Specimen Description BLOOD RIGHT ANTECUBITAL  Final   Special Requests   Final    BOTTLES DRAWN AEROBIC AND ANAEROBIC Blood Culture adequate volume   Culture   Final    NO GROWTH 3 DAYS Performed at [facility], [place] [address], [place], [place] [address]    Report Status PENDING  Incomplete      Radiology Studies: DG Abd Portable 1V  Result Date: 11/01/2021 CLINICAL DATA:  Small bowel obstruction. EXAM: PORTABLE ABDOMEN - 1 VIEW COMPARISON:  Oct 31, 2021. FINDINGS: The bowel gas pattern is normal. No radio-opaque calculi or other significant radiographic abnormality are seen. IMPRESSION: Negative. Electronically Signed   By: Marijo Conception M.D.   On: 11/01/2021 08:27      LOS: 38 days    Cordelia Poche, MD [facility] 11/02/2021, 10:18 AM   If 7PM-7AM, please contact night-coverage [email or web address]

## 2021-11-02 NOTE — Progress Notes (Signed)
Progress Note     Subjective: Pt reports she is tolerating FLD and having bowel function. She had some nausea but not emesis. She does not feel bloated and does not have abdominal pain. She really wants to try to eat solid food this AM.   Objective: Vital signs in last 24 hours: Temp:  [97.8 F (36.6 C)-98.4 F (36.9 C)] 98 F (36.7 C) (05/26 0530) Pulse Rate:  [90-103] 95 (05/26 0741) Resp:  [16-24] 24 (05/26 0741) BP: (110-121)/(55-65) 110/55 (05/26 0530) SpO2:  [95 %-100 %] 96 % (05/26 0741) FiO2 (%):  [40 %] 40 % (05/26 0741) Weight:  [52.9 kg] 52.9 kg (05/26 0500) Last BM Date : 11/01/21  Intake/Output from previous day: 05/25 0701 - 05/26 0700 In: 2681.1 [P.O.:940; I.V.:1741.1] Out: 1550 [Urine:1550] Intake/Output this shift: No intake/output data recorded.  PE: General: pleasant, WD, chronically ill appearing female who is laying in bed in NAD Heart: regular, rate, and rhythm.   Lungs:  Respiratory effort nonlabored Abd: soft, NT, no distention, +BS Psych: A&Ox3 with an appropriate affect.    Lab Results:  Recent Labs    11/01/21 0343 11/02/21 0303  WBC 15.9* 15.3*  HGB 8.4* 8.4*  HCT 26.4* 25.6*  PLT 259 251    BMET Recent Labs    10/31/21 0445 11/01/21 0343  NA 140 131*  K 4.0 3.7  CL 98 99  CO2 34* 28  GLUCOSE 103* 80  BUN 18 14  CREATININE 0.32* <0.30*  CALCIUM 9.0 8.2*    PT/INR Recent Labs    11/02/21 0303  LABPROT 14.2  INR 1.1   CMP     Component Value Date/Time   NA 131 (L) 11/01/2021 0343   K 3.7 11/01/2021 0343   CL 99 11/01/2021 0343   CO2 28 11/01/2021 0343   GLUCOSE 80 11/01/2021 0343   BUN 14 11/01/2021 0343   CREATININE <0.30 (L) 11/01/2021 0343   CALCIUM 8.2 (L) 11/01/2021 0343   PROT 6.3 (L) 10/27/2021 0432   ALBUMIN 1.9 (L) 10/27/2021 0432   AST 38 10/27/2021 0432   ALT 86 (H) 10/27/2021 0432   ALKPHOS 200 (H) 10/27/2021 0432   BILITOT 0.4 10/27/2021 0432   GFRNONAA NOT CALCULATED 11/01/2021 0343    GFRAA >60 05/14/2019 1651   Lipase     Component Value Date/Time   LIPASE 23 05/14/2019 1651       Studies/Results: DG Abd Portable 1V  Result Date: 11/01/2021 CLINICAL DATA:  Small bowel obstruction. EXAM: PORTABLE ABDOMEN - 1 VIEW COMPARISON:  Oct 31, 2021. FINDINGS: The bowel gas pattern is normal. No radio-opaque calculi or other significant radiographic abnormality are seen. IMPRESSION: Negative. Electronically Signed   By: Marijo Conception M.D.   On: 11/01/2021 08:27   DG Abd Portable 1V  Result Date: 10/31/2021 CLINICAL DATA:  Small bowel obstruction. EXAM: PORTABLE ABDOMEN - 1 VIEW COMPARISON:  Radiograph 10/30/2021 FINDINGS: Interval decrease in prominence of central small bowel lobes. Loops measure less than 3 cm. Moderate volume stool in the descending colon and rectum. Gas in the rectum. IMPRESSION: Improving small bowel obstruction pattern. Electronically Signed   By: Suzy Bouchard M.D.   On: 10/31/2021 09:46    Anti-infectives: Anti-infectives (From admission, onward)    Start     Dose/Rate Route Frequency Ordered Stop   11/02/21 0000  ceFAZolin (ANCEF) IVPB 2g/100 mL premix        2 g 200 mL/hr over 30 Minutes Intravenous To Radiology 11/01/21 0957  11/03/21 0000   10/25/21 0400  vancomycin (VANCOCIN) IVPB 1000 mg/200 mL premix  Status:  Discontinued        1,000 mg 200 mL/hr over 60 Minutes Intravenous Every 24 hours 10/24/21 0411 10/25/21 1513   10/24/21 1600  ceFEPIme (MAXIPIME) 2 g in sodium chloride 0.9 % 100 mL IVPB  Status:  Discontinued        2 g 200 mL/hr over 30 Minutes Intravenous Every 12 hours 10/24/21 0411 10/29/21 1457   10/24/21 0430  ceFEPIme (MAXIPIME) 2 g in sodium chloride 0.9 % 100 mL IVPB        2 g 200 mL/hr over 30 Minutes Intravenous  Once 10/24/21 0343 10/24/21 0719   10/24/21 0400  vancomycin (VANCOREADY) IVPB 1250 mg/250 mL        1,250 mg 166.7 mL/hr over 90 Minutes Intravenous  Once 10/24/21 0343 10/24/21 0603   10/06/21 1330   cefTRIAXone (ROCEPHIN) 2 g in sodium chloride 0.9 % 100 mL IVPB        2 g 200 mL/hr over 30 Minutes Intravenous Every 24 hours 10/06/21 1242 10/08/21 1529   10/04/21 0900  ceFEPIme (MAXIPIME) 2 g in sodium chloride 0.9 % 100 mL IVPB  Status:  Discontinued        2 g 200 mL/hr over 30 Minutes Intravenous Every 12 hours 10/04/21 0816 10/06/21 1242   10/03/21 0900  cefTRIAXone (ROCEPHIN) 1 g in sodium chloride 0.9 % 100 mL IVPB  Status:  Discontinued        1 g 200 mL/hr over 30 Minutes Intravenous Every 24 hours 10/03/21 0819 10/04/21 0816   09/25/21 2030  azithromycin (ZITHROMAX) tablet 500 mg       See Hyperspace for full Linked Orders Report.   500 mg Oral Daily 09/24/21 2020 09/28/21 0829   09/24/21 2020  azithromycin (ZITHROMAX) 500 mg in sodium chloride 0.9 % 250 mL IVPB       See Hyperspace for full Linked Orders Report.   500 mg 250 mL/hr over 60 Minutes Intravenous Every 24 hours 09/24/21 2020 09/24/21 2225        Assessment/Plan Partial SBO vs ileus - hx Cystectomy and ileal neo-bladder 2006, transabdominal and transvaginal fistula repair 2006, hysterectomy - CT 5/23 shows dilated small bowel loops in the pelvis near surgical anastomotic site concerning for partial obstruction; small amount of free fluid is noted in the pelvis of uncertain significance; mild gastric distension; stool and contrast noted throughout the nondilated colon - Patient states that she is feeling better overall. Tolerating sips and had a BM overnight. She reports issues with constipation more chronically as well.  - advance to soft diet today, ok to advance as tolerated to regular  - continue suppository daily and continue daily miralax BID - need for PEG per primary attending - no indication for surgical intervention at this time, we will sign off    ID - none currently VTE - lovenox FEN - soft diet  Foley - none   COPD exacerbation Acute respiratory failure with hypoxia and  hypercapnia Tracheostomy/vent dependent respiratory failure Pulmonary nodules Hospital acquired PNA - completed abx Acute systolic heart failure Severe malnutrition HLD  LOS: 38 days     Norm Parcel, [facility] Surgery 11/02/2021, 8:54 AM Please see Amion for pager number during day hours 7:00am-4:30pm

## 2021-11-02 NOTE — Progress Notes (Signed)
Physical Therapy Treatment Patient Details Name: [name] MRN: [id number] DOB: 1955/12/12 Today's Date: 11/02/2021   History of Present Illness 66 y/o F reports diagnosis of PNA on 4/16 Admitted 4/17 to [facility], ICU with acute hypoxic/hypercarbic respiratory failure, failed BiPAP and required mechanical ventilation, transferred to [facility] 4/21 due to new finding of LV dysfunction Echo 4/20 with EF 40% , akinesis of apex and septal wall. . Failed weaning attempt 4/28. Trach placed on 5/1. PMH significant for COPD, tobacco use, HLD, GRED, and uncontrolled anxiety.    PT Comments    Patient continues to make incremental progress each session. Able to sit statically on EOB with close supervision but continues to require minA for dynamic sitting balance. Requires maxA+2 for sit to stand and stand pivot transfer with HHAx2. Unable to take steps this session. Requires extended rest breaks due to activity tolerance but willing to participate. Continue to recommend acute inpatient rehab (AIR) for post-acute therapy needs.     Recommendations for follow up therapy are one component of a multi-disciplinary discharge planning process, led by the attending physician.  Recommendations may be updated based on patient status, additional functional criteria and insurance authorization.  Follow Up Recommendations  Acute inpatient rehab (3hours/day)     Assistance Recommended at Discharge Frequent or constant Supervision/Assistance  Patient can return home with the following Two people to help with walking and/or transfers;Two people to help with bathing/dressing/bathroom;Assist for transportation;Help with stairs or ramp for entrance   Equipment Recommendations  BSC/3in1;Wheelchair (measurements PT);Wheelchair cushion (measurements PT);Other (comment);Hospital bed (hoyer lift)    Recommendations for Other Services       Precautions / Restrictions Precautions Precautions: Fall Precaution Comments:  trach, cortrak Restrictions Weight Bearing Restrictions: No     Mobility  Bed Mobility Overal bed mobility: Needs Assistance Bed Mobility: Rolling, Sidelying to Sit Rolling: Min assist Sidelying to sit: Mod assist, HOB elevated       General bed mobility comments: minA to roll L /R for pericare. With log roll technique, patient requires modA for trunk elevation    Transfers Overall transfer level: Needs assistance Equipment used: 2 person hand held assist Transfers: Sit to/from Stand, Bed to chair/wheelchair/BSC Sit to Stand: Max assist, +2 physical assistance, +2 safety/equipment Stand pivot transfers: Max assist, +2 physical assistance, +2 safety/equipment         General transfer comment: maxA+2 to stand from bed surface with HHAx2, cues and facilitation at hips for hip extension. Unable to take steps this session so performed stand pivot    Ambulation/Gait                   Stairs             Wheelchair Mobility    Modified Rankin (Stroke Patients Only)       Balance Overall balance assessment: Needs assistance Sitting-balance support: Single extremity supported, Feet supported Sitting balance-Leahy Scale: Fair Sitting balance - Comments: statically her balance is fair but dynamicaly poor   Standing balance support: Bilateral upper extremity supported Standing balance-Leahy Scale: Zero                              Cognition Arousal/Alertness: Awake/alert Behavior During Therapy: WFL for tasks assessed/performed, Anxious Overall Cognitive Status: Within Functional Limits for tasks assessed  Exercises      General Comments        Pertinent Vitals/Pain Pain Assessment Pain Assessment: No/denies pain    Home Living                          Prior Function            PT Goals (current goals can now be found in the care plan section) Acute Rehab PT  Goals PT Goal Formulation: With patient Time For Goal Achievement: 11/06/21 Potential to Achieve Goals: Fair Progress towards PT goals: Progressing toward goals    Frequency           PT Plan Current plan remains appropriate    Co-evaluation              AM-PAC PT "6 Clicks" Mobility   Outcome Measure  Help needed turning from your back to your side while in a flat bed without using bedrails?: A Little Help needed moving from lying on your back to sitting on the side of a flat bed without using bedrails?: A Lot Help needed moving to and from a bed to a chair (including a wheelchair)?: Total Help needed standing up from a chair using your arms (e.g., wheelchair or bedside chair)?: Total Help needed to walk in hospital room?: Total Help needed climbing 3-5 steps with a railing? : Total 6 Click Score: 9    End of Session Equipment Utilized During Treatment: Oxygen;Gait belt Activity Tolerance: Patient tolerated treatment well Patient left: in chair;with call bell/phone within reach;with chair alarm set Nurse Communication: Mobility status;Need for lift equipment PT Visit Diagnosis: Other abnormalities of gait and mobility (R26.89);Muscle weakness (generalized) (M62.81);Difficulty in walking, not elsewhere classified (R26.2)     Time: [id number] PT Time Calculation (min) (ACUTE ONLY): 39 min  Charges:  $Therapeutic Activity: 23-37 mins                     Jamahl Lemmons A. Gilford Rile PT, DPT Acute Rehabilitation Services Pager [phone] Office [phone]    Linna Hoff 11/02/2021, 5:02 PM

## 2021-11-02 NOTE — Progress Notes (Signed)
Inpatient Rehab Admissions Coordinator:    Pt. Not yet medically ready for CIR. I will continue to follow for potential admit pending medical readiness.   Megan Salon, MS, CCC-SLP Rehab Admissions Coordinator  [phone] (celll) [phone] (office)

## 2021-11-02 NOTE — Progress Notes (Signed)
Occupational Therapy Treatment Patient Details Name: [name] MRN: [id number] DOB: April 06, 1956 Today's Date: 11/02/2021   History of present illness 66 y/o F reports diagnosis of PNA on 4/16 Admitted 4/17 to [facility], ICU with acute hypoxic/hypercarbic respiratory failure, failed BiPAP and required mechanical ventilation, transferred to [facility] 4/21 due to new finding of LV dysfunction Echo 4/20 with EF 40% , akinesis of apex and septal wall. . Failed weaning attempt 4/28. Trach placed on 5/1. PMH significant for COPD, tobacco use, HLD, GRED, and uncontrolled anxiety.   OT comments  Pt making good progress with OT goals this session. She was very excited to be assisted into positions that allow her to be more independent with self feeding and grooming. Up in the chair, pt was able to lift her arms up and reach the tray. She continues to require max A +2 for transfers and requires support sitting EOB for balance due to weak trunk support. Continuing to recommend AIR to maximize independence. OT will follow acutely.    Recommendations for follow up therapy are one component of a multi-disciplinary discharge planning process, led by the attending physician.  Recommendations may be updated based on patient status, additional functional criteria and insurance authorization.    Follow Up Recommendations  Acute inpatient rehab (3hours/day)    Assistance Recommended at Discharge Frequent or constant Supervision/Assistance  Patient can return home with the following  Two people to help with walking and/or transfers;Two people to help with bathing/dressing/bathroom;Assistance with cooking/housework;Assistance with feeding;Direct supervision/assist for medications management;Direct supervision/assist for financial management;Assist for transportation;Help with stairs or ramp for entrance   Equipment Recommendations  Other (comment) (tbd)    Recommendations for Other Services      Precautions /  Restrictions Precautions Precautions: Fall Precaution Comments: trach, cortrak Restrictions Weight Bearing Restrictions: No       Mobility Bed Mobility Overal bed mobility: Needs Assistance Bed Mobility: Rolling, Sidelying to Sit Rolling: Min assist Sidelying to sit: Mod assist, HOB elevated       General bed mobility comments: minA to roll L /R for pericare. With log roll technique, patient requires modA for trunk elevation    Transfers Overall transfer level: Needs assistance Equipment used: 2 person hand held assist Transfers: Sit to/from Stand, Bed to chair/wheelchair/BSC Sit to Stand: Max assist, +2 physical assistance, +2 safety/equipment Stand pivot transfers: Max assist, +2 physical assistance, +2 safety/equipment         General transfer comment: maxA+2 to stand from bed surface with HHAx2, cues and facilitation at hips for hip extension. Unable to take steps this session so performed stand pivot     Balance Overall balance assessment: Needs assistance Sitting-balance support: Single extremity supported, Feet supported Sitting balance-Leahy Scale: Fair Sitting balance - Comments: statically her balance is fair but dynamicaly poor   Standing balance support: Bilateral upper extremity supported Standing balance-Leahy Scale: Zero                             ADL either performed or assessed with clinical judgement   ADL Overall ADL's : Needs assistance/impaired Eating/Feeding: Set up;Sitting Eating/Feeding Details (indicate cue type and reason): Once pt got into recliner, she needed all containers open, however then she was able to feed herself. Grooming: Set up;Wash/dry face;Wash/dry hands;Oral care;Sitting Grooming Details (indicate cue type and reason): completed in reclienr     Lower Body Bathing: Total assistance;Bed level Lower Body Bathing Details (indicate cue type and  reason): unable to assist due to fatigue, assisted with rolling in bed          Toilet Transfer: Maximal assistance;+2 for physical assistance;+2 for safety/equipment;Stand-pivot Toilet Transfer Details (indicate cue type and reason): Pt able to stand from EOB with max A and required +2 to assist with pivot, was attempting to move BLE for pivot         Functional mobility during ADLs: Maximal assistance;+2 for physical assistance;+2 for safety/equipment General ADL Comments: Pt with improved self efficacy this session, wanting to be more independent with tasks and grateful for setup to allow her to be more independent    Extremity/Trunk Assessment              Vision       Perception     Praxis      Cognition Arousal/Alertness: Awake/alert Behavior During Therapy: WFL for tasks assessed/performed, Anxious Overall Cognitive Status: Within Functional Limits for tasks assessed                                 General Comments: Cognition appears to be close to her baseline, following 2 step commands and participating in conversations        Exercises      Shoulder Instructions       General Comments VSS on trach collar, 40% FIO2, 10L    Pertinent Vitals/ Pain       Pain Assessment Pain Assessment: No/denies pain  Home Living                                          Prior Functioning/Environment              Frequency  Min 2X/week        Progress Toward Goals  OT Goals(current goals can now be found in the care plan section)  Progress towards OT goals: Progressing toward goals  Acute Rehab OT Goals Patient Stated Goal: To go home to her dogs OT Goal Formulation: With patient Time For Goal Achievement: 11/07/21 Potential to Achieve Goals: Good ADL Goals Additional ADL Goal #1: Pt will complete rolling in bed both sides with mod assist as a precurser to sitting EOB. Additional ADL Goal #2: Pt will visually scan room and find 3 objects with independence. Additional ADL Goal #3: Pt will  complete AAROM of BUE with 50%/mod assist. Additional ADL Goal #4: Pt will hold and utilize tooth brush sponge to sucessfully brush her teeth and mouth with min A.  Plan Discharge plan needs to be updated;Frequency remains appropriate    Co-evaluation                 AM-PAC OT "6 Clicks" Daily Activity     Outcome Measure   Help from another person eating meals?: A Little Help from another person taking care of personal grooming?: A Lot Help from another person toileting, which includes using toliet, bedpan, or urinal?: A Lot Help from another person bathing (including washing, rinsing, drying)?: A Lot Help from another person to put on and taking off regular upper body clothing?: A Lot Help from another person to put on and taking off regular lower body clothing?: Total 6 Click Score: 12    End of Session Equipment Utilized During Treatment: Oxygen;Gait belt  OT Visit Diagnosis: Unsteadiness on  feet (R26.81);Other abnormalities of gait and mobility (R26.89);Muscle weakness (generalized) (M62.81)   Activity Tolerance Patient tolerated treatment well   Patient Left in chair;with call bell/phone within reach   Nurse Communication Mobility status        Time: [id number] OT Time Calculation (min): 39 min  Charges: OT General Charges $OT Visit: 1 Visit OT Treatments $Self Care/Home Management : 23-37 mins  Kimmi Acocella H., OTR/L Acute Rehabilitation  Quasean Frye Elane Taelor Waymire 11/02/2021, 5:21 PM

## 2021-11-02 NOTE — TOC Progression Note (Signed)
Transition of Care [facility]) - Progression Note    Patient Details  Name: SAMEEN LEAS MRN: [id number] Date of Birth: 1956-02-21  Transition of [facility]) CM/SW Contact  Janae Bridgeman, RN Phone Number: 11/02/2021, 8:43 AM  Clinical Narrative:    CM and LCSW with DTP Team continue to follow the patient for TOC needs.  Patient continues to require 40% oxygen and likely need for PEG placement.  CIR is following the patient for pending admission at later date.  Expected Discharge Plan: IP Rehab Facility Barriers to Discharge: Continued Medical Work up  Expected Discharge Plan and Services Expected Discharge Plan: IP Rehab Facility In-house Referral: Clinical Social Work Discharge Planning Services: CM Consult Post Acute Care Choice: IP Rehab Living arrangements for the past 2 months: Single Family Home                                       Social Determinants of Health (SDOH) Interventions    Readmission Risk Interventions    10/26/2021    5:04 PM 10/26/2021    5:02 PM  Readmission Risk Prevention Plan  Medication Review (RN Care Manager)  Complete  PCP or Specialist appointment within 3-5 days of discharge Complete   Skilled Nursing Facility Complete

## 2021-11-03 DIAGNOSIS — J9601 Acute respiratory failure with hypoxia: Secondary | ICD-10-CM | POA: Diagnosis not present

## 2021-11-03 DIAGNOSIS — R739 Hyperglycemia, unspecified: Secondary | ICD-10-CM | POA: Diagnosis not present

## 2021-11-03 DIAGNOSIS — I5022 Chronic systolic (congestive) heart failure: Secondary | ICD-10-CM | POA: Diagnosis not present

## 2021-11-03 DIAGNOSIS — G9340 Encephalopathy, unspecified: Secondary | ICD-10-CM | POA: Diagnosis not present

## 2021-11-03 LAB — GLUCOSE, CAPILLARY
Glucose-Capillary: 107 mg/dL — ABNORMAL HIGH (ref 70–99)
Glucose-Capillary: 126 mg/dL — ABNORMAL HIGH (ref 70–99)

## 2021-11-03 NOTE — Progress Notes (Signed)
PROGRESS NOTE    [name]  [id number] DOB: 25-Dec-1955 DOA: 09/24/2021 PCP: Everardo Beals, NP   Brief Narrative:  [name] is a 66 y.o. female with PMH significant for GOLD 3 COPD, quit smoking 2021, uncontrolled anxiety. Patient presented to ED on 4/17 with complaint of difficulty breathing, cough, wheezing, fatigue with symptoms worsening for 1 week. EMS noted hypoxia. Patient required BiPAP in the ED and was admitted to hospitalist service, however her respiratory status continued to worsen. On 4/19, she was intubated and transferred to ICU. She was treated for pneumonia and was found to have  newly diagnosed acute systolic heart failure. Patient was unable to wean off the ventilator successfully and underwent tracheostomy placement on 5/1. She was transferred out of the ICU on 5/15. She is currently receiving tube feeds via NG tube with plans for PEG tube placement.   Assessment and Plan:  Acute respiratory failure with hypoxia and hypercapnia Tracheostomy/vent dependent respiratory failure Patient admitted initially with COPD exacerbation and managed on BiPAP. Worsening respiratory failure lead to [facility] consult and requirement for mechanical ventilation. Patient intubated on 4/19. Prolonged ventilator wean and so tracheostomy performed on 5/1. Patient having trouble controlling her secretions. Supplemental oxygen increased to 10 L/min -Continue trach care per PCCM -Continue chest physiotherapy and hypertonic saline per PCCM recommendations -Wean oxygen as able  Abdominal distension Partial SBO vs Ileus New issue. Patient with decreased flatus per her report in addition to infrequent bowel movements, even on tube feeds. Abdominal x-ray ordered 5/23 which is concerning for partial SBO. Associated abdominal pain. CT abdomen/pelvis confirms concern for partial SBO but also shows contrast from prior CT retained in the colon. General surgery consulted and are leaning more  towards ileus diagnosis. Symptoms appear improved.  Having bowel movements. -General surgery recommendations: Soft diet. Signed off.  Leukocytosis Unsure if etiology at this time but recent CT chest significant for multiple nodules concerning for infection. Recently completed antibiotics. Trending down after recent WBC peak of 21,000. -Daily CBC  Pulmonary nodules Concerning for possible infection. Most recent blood cultures (5/17) with no growth. Procalcitonin undetectable. Afebrile. Repeat blood cultures (5/23) are no growth to date. -Follow-up CT chest w/contrast in 3 months  Hospital acquired pneumonia, resolved. Tracheal aspirate (4/27) significant for klebsiella pneumoniae infection. Patient completed 6 days of antibiotic therapy with Ceftriaxone/Cefepime.  COPD exacerbation, resolved COPD Initial problem which led to acute respiratory failure and mechanical ventilation. Patient treated with Steroids and nebulizer treatments. Exacerbation resolved. -Continue Brovana, Pulmicort scheduled and Xopenex PRN  Acute systolic heart failure LVEF of 40% on recent Transthoracic Echocardiogram with focal WMA. Cardiology consulted with recommendation for outpatient ischemic evaluation.  Acute metabolic encephalopathy Resolved. Managed with Seroquel which is being decreased.  Severe anxiety Continue Klonopin and Seroquel  Thrombocytopenia Transient in setting of acute illness. Improved.  Severe malnutrition Cortrak feeding tube placed on 4/28. Speech therapy on board. Unable to wean off tube feeds, so plan is for placement of a PEG tube. Patient advanced to a regular diet on 5/11. Tube feeds transitioned to overnight and are now held secondary to recent vomiting. Cortrak removed secondary to clogging. -Will trial off of enteral nutrition over the weekend -Oral diet -Dietitian recommendations  Hyperlipidemia -Continue Crestor  Hyperglycemia Hemoglobin A1C of 5.4%  Sepsis Not  present on admission. Secondary to recurrent pneumonia. No bacteremia. Now resolved.   DVT prophylaxis: Lovenox Code Status:   Code Status: Full Code Family Communication: None at bedside Disposition Plan: Discharge hopefully  to CIR once oxygen is weaned down to 5 L/min   Consultants:  PCCM Cardiology  Procedures:  Transthoracic Echocardiogram (09/27/21)  Antimicrobials: Ceftriaxone Cefepime    Subjective: Patient reports some nausea and back pain. Otherwise, no issues overnight.  Objective: BP 123/66   Pulse 98   Temp 98.7 F (37.1 C)   Resp (!) 21   Ht 5\' 1"  (1.549 m)   Wt 52.9 kg   SpO2 94%   BMI 22.04 kg/m   Examination:  General exam: Appears calm and comfortable Respiratory system: Clear to auscultation. Respiratory effort normal. Cardiovascular system: S1 & S2 heard, RRR.  Gastrointestinal system: Abdomen is moderately distended, soft and non-tender. Normal bowel sounds heard. Central nervous system: Alert and oriented. No focal neurological deficits. Musculoskeletal: No calf tenderness Skin: No cyanosis. No rashes Psychiatry: Judgement and insight appear normal. Mood & affect appropriate.    Data Reviewed: I have personally reviewed following labs and imaging studies  CBC Lab Results  Component Value Date   WBC 15.3 (H) 11/02/2021   RBC 2.60 (L) 11/02/2021   HGB 8.4 (L) 11/02/2021   HCT 25.6 (L) 11/02/2021   MCV 98.5 11/02/2021   MCH 32.3 11/02/2021   PLT 251 11/02/2021   MCHC 32.8 11/02/2021   RDW 15.3 11/02/2021   LYMPHSABS 2.1 10/30/2021   MONOABS 1.5 (H) 10/30/2021   EOSABS 0.0 10/30/2021   BASOSABS 0.1 Q000111Q     Last metabolic panel Lab Results  Component Value Date   NA 131 (L) 11/01/2021   K 3.7 11/01/2021   CL 99 11/01/2021   CO2 28 11/01/2021   BUN 14 11/01/2021   CREATININE <0.30 (L) 11/01/2021   GLUCOSE 80 11/01/2021   GFRNONAA NOT CALCULATED 11/01/2021   GFRAA >60 05/14/2019   CALCIUM 8.2 (L) 11/01/2021   PHOS  4.0 10/25/2021   PROT 6.3 (L) 10/27/2021   ALBUMIN 1.9 (L) 10/27/2021   BILITOT 0.4 10/27/2021   ALKPHOS 200 (H) 10/27/2021   AST 38 10/27/2021   ALT 86 (H) 10/27/2021   ANIONGAP 4 (L) 11/01/2021    GFR: CrCl cannot be calculated (This lab value cannot be used to calculate CrCl because it is not a number: <0.30).  Recent Results (from the past 240 hour(s))  Culture, blood (Routine X 2) w Reflex to ID Panel     Status: None (Preliminary result)   Collection Time: 10/30/21  1:56 PM   Specimen: BLOOD  Result Value Ref Range Status   Specimen Description BLOOD RIGHT ANTECUBITAL  Final   Special Requests   Final    BOTTLES DRAWN AEROBIC AND ANAEROBIC Blood Culture results may not be optimal due to an inadequate volume of blood received in culture bottles   Culture   Final    NO GROWTH 4 DAYS Performed at [facility], [place] [address], [place], [place] [address]    Report Status PENDING  Incomplete  Culture, blood (Routine X 2) w Reflex to ID Panel     Status: None (Preliminary result)   Collection Time: 10/30/21  1:59 PM   Specimen: BLOOD  Result Value Ref Range Status   Specimen Description BLOOD RIGHT ANTECUBITAL  Final   Special Requests   Final    BOTTLES DRAWN AEROBIC AND ANAEROBIC Blood Culture adequate volume   Culture   Final    NO GROWTH 4 DAYS Performed at [facility], [place] [address] [place], [place] [address]    Report Status PENDING  Incomplete  Radiology Studies: No results found.    LOS: 39 days    Cordelia Poche, MD [facility] 11/03/2021, 11:28 AM   If 7PM-7AM, please contact night-coverage [email or web address]

## 2021-11-04 DIAGNOSIS — I5022 Chronic systolic (congestive) heart failure: Secondary | ICD-10-CM | POA: Diagnosis not present

## 2021-11-04 DIAGNOSIS — L8990 Pressure ulcer of unspecified site, unspecified stage: Secondary | ICD-10-CM | POA: Diagnosis not present

## 2021-11-04 DIAGNOSIS — J9601 Acute respiratory failure with hypoxia: Secondary | ICD-10-CM | POA: Diagnosis not present

## 2021-11-04 DIAGNOSIS — G9340 Encephalopathy, unspecified: Secondary | ICD-10-CM | POA: Diagnosis not present

## 2021-11-04 DIAGNOSIS — R739 Hyperglycemia, unspecified: Secondary | ICD-10-CM | POA: Diagnosis not present

## 2021-11-04 LAB — CBC
HCT: 29.1 % — ABNORMAL LOW (ref 36.0–46.0)
Hemoglobin: 9.4 g/dL — ABNORMAL LOW (ref 12.0–15.0)
MCH: 32.1 pg (ref 26.0–34.0)
MCHC: 32.3 g/dL (ref 30.0–36.0)
MCV: 99.3 fL (ref 80.0–100.0)
Platelets: 263 10*3/uL (ref 150–400)
RBC: 2.93 MIL/uL — ABNORMAL LOW (ref 3.87–5.11)
RDW: 15.1 % (ref 11.5–15.5)
WBC: 18 10*3/uL — ABNORMAL HIGH (ref 4.0–10.5)
nRBC: 0 % (ref 0.0–0.2)

## 2021-11-04 LAB — CULTURE, BLOOD (ROUTINE X 2)
Culture: NO GROWTH
Culture: NO GROWTH
Special Requests: ADEQUATE

## 2021-11-04 LAB — TECHNOLOGIST SMEAR REVIEW

## 2021-11-04 MED ORDER — ALBUTEROL SULFATE (2.5 MG/3ML) 0.083% IN NEBU
2.5000 mg | INHALATION_SOLUTION | Freq: Two times a day (BID) | RESPIRATORY_TRACT | Status: DC
  Administered 2021-11-04 – 2021-11-11 (×14): 2.5 mg via RESPIRATORY_TRACT
  Filled 2021-11-04 (×14): qty 3

## 2021-11-04 MED ORDER — ENSURE ENLIVE PO LIQD
237.0000 mL | Freq: Three times a day (TID) | ORAL | Status: DC
  Administered 2021-11-04 – 2021-11-06 (×5): 237 mL via ORAL

## 2021-11-04 NOTE — Progress Notes (Signed)
PROGRESS NOTE    ROEN CRUSH  HER:[id number] DOB: May 27, 1956 DOA: 09/24/2021 PCP: Marva Panda, NP   Brief Narrative:  [name] is a 66 y.o. female with PMH significant for GOLD 3 COPD, quit smoking 2021, uncontrolled anxiety. Patient presented to ED on 4/17 with complaint of difficulty breathing, cough, wheezing, fatigue with symptoms worsening for 1 week. EMS noted hypoxia. Patient required BiPAP in the ED and was admitted to hospitalist service, however her respiratory status continued to worsen. On 4/19, she was intubated and transferred to ICU. She was treated for pneumonia and was found to have  newly diagnosed acute systolic heart failure. Patient was unable to wean off the ventilator successfully and underwent tracheostomy placement on 5/1. She was transferred out of the ICU on 5/15. She is currently receiving tube feeds via NG tube with plans for PEG tube placement.   Assessment and Plan:  Acute respiratory failure with hypoxia and hypercapnia Tracheostomy/vent dependent respiratory failure Patient admitted initially with COPD exacerbation and managed on BiPAP. Worsening respiratory failure lead to [facility] consult and requirement for mechanical ventilation. Patient intubated on 4/19. Prolonged ventilator wean and so tracheostomy performed on 5/1. Patient having trouble controlling her secretions. Supplemental oxygen increased to 10 L/min -Continue trach care per PCCM -Continue chest physiotherapy and hypertonic saline per PCCM recommendations -Wean oxygen as able  Abdominal distension Partial SBO vs Ileus New issue. Patient with decreased flatus per her report in addition to infrequent bowel movements, even on tube feeds. Abdominal x-ray ordered 5/23 which is concerning for partial SBO. Associated abdominal pain. CT abdomen/pelvis confirms concern for partial SBO but also shows contrast from prior CT retained in the colon. General surgery consulted and are leaning more  towards ileus diagnosis. Symptoms appear improved.  Having bowel movements. -General surgery recommendations: Soft diet. Signed off.  Leukocytosis Unsure if etiology at this time but recent CT chest significant for multiple nodules concerning for infection. Recently completed antibiotics. Elevated and stable. No fevers. Neutrophil predominant. -Smear review  Pulmonary nodules Concerning for possible infection. Most recent blood cultures (5/17) with no growth. Procalcitonin undetectable. Afebrile. Repeat blood cultures (5/23) are no growth to date. -Follow-up CT chest w/contrast in 3 months  Hospital acquired pneumonia, resolved. Tracheal aspirate (4/27) significant for klebsiella pneumoniae infection. Patient completed 6 days of antibiotic therapy with Ceftriaxone/Cefepime.  COPD exacerbation, resolved COPD Initial problem which led to acute respiratory failure and mechanical ventilation. Patient treated with Steroids and nebulizer treatments. Exacerbation resolved. -Continue Brovana, Pulmicort scheduled and Xopenex PRN  Acute systolic heart failure LVEF of 40% on recent Transthoracic Echocardiogram with focal WMA. Cardiology consulted with recommendation for outpatient ischemic evaluation.  Acute metabolic encephalopathy Resolved. Managed with Seroquel which is being decreased.  Severe anxiety Continue Klonopin and Seroquel  Thrombocytopenia Transient in setting of acute illness. Improved.  Severe malnutrition Cortrak feeding tube placed on 4/28. Speech therapy on board. Unable to wean off tube feeds, so plan is for placement of a PEG tube. Patient advanced to a regular diet on 5/11. Tube feeds transitioned to overnight and are now held secondary to recent vomiting. Cortrak removed secondary to clogging. -Trial off of enteral nutrition over the weekend -Oral diet -Dietitian recommendations  Hyperlipidemia -Continue Crestor  Hyperglycemia Hemoglobin A1C of 5.4%  Sepsis Not  present on admission. Secondary to recurrent pneumonia. No bacteremia. Now resolved.  Pressure injury Medial coccyx, not present on admission.    DVT prophylaxis: Lovenox Code Status:   Code Status: Full Code Family  Communication: None at bedside Disposition Plan: Discharge hopefully to CIR once oxygen is weaned down to 5 L/min   Consultants:  PCCM Cardiology  Procedures:  Transthoracic Echocardiogram (09/27/21)  Antimicrobials: Ceftriaxone Cefepime    Subjective: No issues noted overnight.  Objective: BP 101/79 (BP Location: Left Arm)   Pulse 97   Temp 98.5 F (36.9 C)   Resp 18   Ht 5\' 1"  (1.549 m)   Wt 53.6 kg   SpO2 98%   BMI 22.33 kg/m   Examination:  General exam: Appears calm and comfortable Respiratory system: Respiratory effort normal. Gastrointestinal system: Abdomen is nondistended, soft and nontender. Normal bowel sounds heard.   Data Reviewed: I have personally reviewed following labs and imaging studies  CBC Lab Results  Component Value Date   WBC 18.0 (H) 11/04/2021   RBC 2.93 (L) 11/04/2021   HGB 9.4 (L) 11/04/2021   HCT 29.1 (L) 11/04/2021   MCV 99.3 11/04/2021   MCH 32.1 11/04/2021   PLT 263 11/04/2021   MCHC 32.3 11/04/2021   RDW 15.1 11/04/2021   LYMPHSABS 2.1 10/30/2021   MONOABS 1.5 (H) 10/30/2021   EOSABS 0.0 10/30/2021   BASOSABS 0.1 Q000111Q     Last metabolic panel Lab Results  Component Value Date   NA 131 (L) 11/01/2021   K 3.7 11/01/2021   CL 99 11/01/2021   CO2 28 11/01/2021   BUN 14 11/01/2021   CREATININE <0.30 (L) 11/01/2021   GLUCOSE 80 11/01/2021   GFRNONAA NOT CALCULATED 11/01/2021   GFRAA >60 05/14/2019   CALCIUM 8.2 (L) 11/01/2021   PHOS 4.0 10/25/2021   PROT 6.3 (L) 10/27/2021   ALBUMIN 1.9 (L) 10/27/2021   BILITOT 0.4 10/27/2021   ALKPHOS 200 (H) 10/27/2021   AST 38 10/27/2021   ALT 86 (H) 10/27/2021   ANIONGAP 4 (L) 11/01/2021    GFR: CrCl cannot be calculated (This lab value cannot  be used to calculate CrCl because it is not a number: <0.30).  Recent Results (from the past 240 hour(s))  Culture, blood (Routine X 2) w Reflex to ID Panel     Status: None (Preliminary result)   Collection Time: 10/30/21  1:56 PM   Specimen: BLOOD  Result Value Ref Range Status   Specimen Description BLOOD RIGHT ANTECUBITAL  Final   Special Requests   Final    BOTTLES DRAWN AEROBIC AND ANAEROBIC Blood Culture results may not be optimal due to an inadequate volume of blood received in culture bottles   Culture   Final    NO GROWTH 4 DAYS Performed at [facility], [place] [address], [place], [place] [address]    Report Status PENDING  Incomplete  Culture, blood (Routine X 2) w Reflex to ID Panel     Status: None (Preliminary result)   Collection Time: 10/30/21  1:59 PM   Specimen: BLOOD  Result Value Ref Range Status   Specimen Description BLOOD RIGHT ANTECUBITAL  Final   Special Requests   Final    BOTTLES DRAWN AEROBIC AND ANAEROBIC Blood Culture adequate volume   Culture   Final    NO GROWTH 4 DAYS Performed at [facility], [place] [address] [place], [place] [address]    Report Status PENDING  Incomplete      Radiology Studies: No results found.    LOS: 40 days    Cordelia Poche, MD [facility] 11/04/2021, 9:10 AM   If 7PM-7AM, please contact night-coverage [email or web address]

## 2021-11-05 ENCOUNTER — Inpatient Hospital Stay (HOSPITAL_COMMUNITY): Payer: Medicare Other

## 2021-11-05 DIAGNOSIS — G9340 Encephalopathy, unspecified: Secondary | ICD-10-CM | POA: Diagnosis not present

## 2021-11-05 DIAGNOSIS — K56609 Unspecified intestinal obstruction, unspecified as to partial versus complete obstruction: Secondary | ICD-10-CM

## 2021-11-05 DIAGNOSIS — J9601 Acute respiratory failure with hypoxia: Secondary | ICD-10-CM | POA: Diagnosis not present

## 2021-11-05 DIAGNOSIS — F419 Anxiety disorder, unspecified: Secondary | ICD-10-CM | POA: Diagnosis not present

## 2021-11-05 DIAGNOSIS — R739 Hyperglycemia, unspecified: Secondary | ICD-10-CM | POA: Diagnosis not present

## 2021-11-05 DIAGNOSIS — R911 Solitary pulmonary nodule: Secondary | ICD-10-CM

## 2021-11-05 DIAGNOSIS — I5022 Chronic systolic (congestive) heart failure: Secondary | ICD-10-CM | POA: Diagnosis not present

## 2021-11-05 DIAGNOSIS — E876 Hypokalemia: Secondary | ICD-10-CM | POA: Diagnosis not present

## 2021-11-05 LAB — CRYPTOCOCCAL ANTIGEN: Crypto Ag: NEGATIVE

## 2021-11-05 LAB — LACTATE DEHYDROGENASE: LDH: 220 U/L — ABNORMAL HIGH (ref 98–192)

## 2021-11-05 LAB — BASIC METABOLIC PANEL
Anion gap: 6 (ref 5–15)
BUN: 5 mg/dL — ABNORMAL LOW (ref 8–23)
CO2: 35 mmol/L — ABNORMAL HIGH (ref 22–32)
Calcium: 8.6 mg/dL — ABNORMAL LOW (ref 8.9–10.3)
Chloride: 92 mmol/L — ABNORMAL LOW (ref 98–111)
Creatinine, Ser: 0.35 mg/dL — ABNORMAL LOW (ref 0.44–1.00)
GFR, Estimated: >60 mL/min (ref >60)
Glucose, Bld: 125 mg/dL — ABNORMAL HIGH (ref 70–99)
Potassium: 2.8 mmol/L — ABNORMAL LOW (ref 3.5–5.1)
Sodium: 133 mmol/L — ABNORMAL LOW (ref 135–145)

## 2021-11-05 LAB — CBC
HCT: 30.4 % — ABNORMAL LOW (ref 36.0–46.0)
Hemoglobin: 9.9 g/dL — ABNORMAL LOW (ref 12.0–15.0)
MCH: 31.8 pg (ref 26.0–34.0)
MCHC: 32.6 g/dL (ref 30.0–36.0)
MCV: 97.7 fL (ref 80.0–100.0)
Platelets: 328 10*3/uL (ref 150–400)
RBC: 3.11 MIL/uL — ABNORMAL LOW (ref 3.87–5.11)
RDW: 15.3 % (ref 11.5–15.5)
WBC: 23.1 10*3/uL — ABNORMAL HIGH (ref 4.0–10.5)
nRBC: 0 % (ref 0.0–0.2)

## 2021-11-05 LAB — GLUCOSE, CAPILLARY: Glucose-Capillary: 116 mg/dL — ABNORMAL HIGH (ref 70–99)

## 2021-11-05 MED ORDER — POTASSIUM CHLORIDE CRYS ER 20 MEQ PO TBCR
40.0000 meq | EXTENDED_RELEASE_TABLET | ORAL | Status: AC
  Administered 2021-11-05 (×2): 40 meq via ORAL
  Filled 2021-11-05 (×2): qty 2

## 2021-11-05 NOTE — PMR Pre-admission (Signed)
PMR Admission Coordinator Pre-Admission Assessment  Patient: [name] is an 66 y.o., female MRN: [id number] DOB: [birth year] Height: _0  (154.9 cm) Weight: 50.8 kg  Insurance Information HMO:   yes  PPO: ***     PCP: ***     IPA: ***     80/20: ***     OTHER: *** PRIMARY: Blue Medicare      Policy#: [id number]      Subscriber: Pt CM Name: ***      Phone#: ***     Fax#: [phone] Pre-Cert#: ***      Employer: *** Benefits:  Phone #: [phone]     Name: Irene Shipper Date: 06/10/2021 - still active Deductible: does not have one OOP Max: $3,950 ($302.29 met) CIR: $335/day co-pay for days 1-6 SNF: $0.00 Copayment per day for days 1-20; $196 Copayment per day for days 21-60; $0.00 copayment for days 61-100 Maximum of 100 days/benefit period Outpatient:  $10 copay/visit Home Health:  100% coverage DME: 80% coverage, 20% co-insurance Providers: in network  SECONDARY:       Policy#:      Phone#:   Development worker, community:       Phone#:   The Engineer, petroleum" for patients in Inpatient Rehabilitation Facilities with attached "Privacy Act [place] Records" was provided and verbally reviewed with: Patient and Family  Emergency Contact Information Contact Information     Name Relation Home Work Mobile   Pe,Edward Spouse [phone]  [phone]   Salley Hews Sister   [phone]       Current Medical History  Patient Admitting Diagnosis: Respiratory Failure History of Present Illness: [name] is a 66 y.o. female with medical history significant for COPD, uncontrolled anxiety. Patient presented to the [facility] ED 09/24/21 with complaints of difficulty breathing, cough, wheezing of about 1 week duration.  She also reports wheezing, and fatigue.  She reports O2 sats down to 78% on room air. Patient 98.8.  Heart rate 92-139.  Tachypneic with respiratory rate 33-37.  Blood pressure systolic 357S to 177L.  O2 sats 96 to 100% on 4  L.  O2 sats on room air not documented in ED.  Diffuse expiratory wheezing heard in ED.Patient was evaluated by RT in the ED and was placed on BiPAP. 2 view chest x-ray without acute abnormality. IV Solu-Medrol, DuoNeb started.  Ativan 1 mg oral tablet given. Pt ultimately failed BiPAP and required mechanical ventilation, transferred to [facility] 4/21 due to new finding of LV dysfunction Echo 4/20 with EF 40% , akinesis of apex and septal wall. . Failed weaning attempt 4/28. Trach placed on 5/1. Pt. With persistent encephalopathy throughout admission. Inially with coretrak, but initiated regular diet 11/02/21. PT/OT/SLP were consulted and recommended CIR to assist return to PLOF.     Patient's medical record from [facility] and [facility]  has been reviewed by the rehabilitation admission coordinator and physician.  Past Medical History  Past Medical History:  Diagnosis Date   Anxiety    Chronic back pain    COPD (chronic obstructive pulmonary disease) (HCC)    Tobacco abuse     Has the patient had major surgery during 100 days prior to admission? Yes  Family History   family history includes Atrial fibrillation in her sister.  Current Medications  Current Facility-Administered Medications:    0.45 % sodium chloride infusion, , Intravenous, Continuous, Mariel Aloe, MD, Stopped at 11/04/21 1034   0.9 %  sodium  chloride infusion, , Intravenous, PRN, Olalere, Adewale A, MD, Stopped at 10/17/21 0748   0.9 %  sodium chloride infusion, 250 mL, Intravenous, Continuous, Meier, Hortencia Conradi, MD   acetaminophen (TYLENOL) tablet 650 mg, 650 mg, Per Tube, Q6H PRN, 650 mg at 10/29/21 1802 **OR** acetaminophen (TYLENOL) suppository 650 mg, 650 mg, Rectal, Q6H PRN, Millen, Jessica B, RPH   albuterol (PROVENTIL) (2.5 MG/3ML) 0.083% nebulizer solution 2.5 mg, 2.5 mg, Nebulization, BID, Mariel Aloe, MD, 2.5 mg at 11/04/21 2014   arformoterol (BROVANA) nebulizer solution 15 mcg,  15 mcg, Nebulization, BID, Tat, David, MD, 15 mcg at 11/04/21 2014   bisacodyl (DULCOLAX) suppository 10 mg, 10 mg, Rectal, Daily, Norm Parcel, PA-C   budesonide (PULMICORT) nebulizer solution 0.5 mg, 0.5 mg, Nebulization, BID, Tat, David, MD, 0.5 mg at 11/04/21 2015   chlorhexidine gluconate (MEDLINE KIT) (PERIDEX) 0.12 % solution 15 mL, 15 mL, Mouth Rinse, BID, Olalere, Adewale A, MD, 15 mL at 11/04/21 0941   clonazePAM (KLONOPIN) tablet 0.5 mg, 0.5 mg, Per Tube, TID, Julian Hy, DO, 0.5 mg at 11/04/21 2101   docusate (COLACE) 50 MG/5ML liquid 100 mg, 100 mg, Per Tube, BID PRN, Olalere, Adewale A, MD, 100 mg at 10/21/21 1102   docusate sodium (COLACE) capsule 100 mg, 100 mg, Oral, BID, Meuth, Brooke A, PA-C, 100 mg at 11/04/21 2101   enoxaparin (LOVENOX) injection 40 mg, 40 mg, Subcutaneous, Q24H, Nettey, Ralph A, MD, 40 mg at 11/04/21 1022   feeding supplement (ENSURE ENLIVE / ENSURE PLUS) liquid 237 mL, 237 mL, Oral, TID BM, Mariel Aloe, MD, 237 mL at 11/04/21 1957   Gerhardt's butt cream, , Topical, QID, Collene Gobble, MD, Given at 11/04/21 2103   guaiFENesin-dextromethorphan (ROBITUSSIN DM) 100-10 MG/5ML syrup 5 mL, 5 mL, Per Tube, Q4H PRN, Millen, Jessica B, RPH, 5 mL at 10/29/21 1030   levalbuterol (XOPENEX) nebulizer solution 0.63 mg, 0.63 mg, Nebulization, Q4H PRN, Tat, David, MD, 0.63 mg at 10/27/21 0454   liver oil-zinc oxide (DESITIN) 40 % ointment, , Topical, PRN, Maryjane Hurter, MD, Given at 11/02/21 2148   LORazepam (ATIVAN) injection 1 mg, 1 mg, Intravenous, Q6H PRN, Dahal, Binaya, MD, 1 mg at 11/05/21 0555   metoprolol tartrate (LOPRESSOR) tablet 12.5 mg, 12.5 mg, Per Tube, BID, Dahal, Binaya, MD, 12.5 mg at 11/04/21 2101   montelukast (SINGULAIR) tablet 10 mg, 10 mg, Per Tube, q AM, Millen, Jessica B, RPH, 10 mg at 11/05/21 0600   morphine (PF) 2 MG/ML injection 2-4 mg, 2-4 mg, Intravenous, Q4H PRN, Mariel Aloe, MD, 2 mg at 11/05/21 0157   ondansetron  (ZOFRAN) injection 4 mg, 4 mg, Intravenous, Q6H PRN, Mariel Aloe, MD, 4 mg at 11/05/21 7494   oxyCODONE (Oxy IR/ROXICODONE) immediate release tablet 5 mg, 5 mg, Per Tube, Q8H, Sanjuan Dame, MD, 5 mg at 11/05/21 0556   polyethylene glycol (MIRALAX / GLYCOLAX) packet 17 g, 17 g, Per Tube, BID, Norm Parcel, PA-C, 17 g at 11/04/21 0941   prochlorperazine (COMPAZINE) injection 10 mg, 10 mg, Intravenous, Q6H PRN, Mariel Aloe, MD, 10 mg at 11/04/21 2328   QUEtiapine (SEROQUEL) tablet 75 mg, 75 mg, Per Tube, BID, Dahal, Binaya, MD, 75 mg at 11/04/21 2101   revefenacin (YUPELRI) nebulizer solution 175 mcg, 175 mcg, Nebulization, Daily, Tat, David, MD, 175 mcg at 11/03/21 0731   rosuvastatin (CRESTOR) tablet 20 mg, 20 mg, Per NG tube, Daily, Lelon Perla, MD, 20 mg at 11/04/21 [phone]  senna (SENOKOT) tablet 8.6 mg, 1 tablet, Per Tube, Daily, Noemi Chapel P, DO, 8.6 mg at 11/04/21 0940   sodium chloride HYPERTONIC 3 % nebulizer solution 4 mL, 4 mL, Nebulization, BID, Mariel Aloe, MD, 4 mL at 11/04/21 2015   white petrolatum (VASELINE) gel, , Topical, PRN, Rigoberto Noel, MD, Given at 10/19/21 2210  Patients Current Diet:  Diet Order             DIET SOFT Room service appropriate? Yes; Fluid consistency: Thin  Diet effective now                   Precautions / Restrictions Precautions Precautions: Fall Precaution Comments: trach, cortrak Restrictions Weight Bearing Restrictions: No   Has the patient had 2 or more falls or a fall with injury in the past year? No  Prior Activity Level Community (5-7x/wk): working, driving  Prior Functional Level Self Care: Did the patient need help bathing, dressing, using the toilet or eating? Independent  Indoor Mobility: Did the patient need assistance with walking from room to room (with or without device)? Independent  Stairs: Did the patient need assistance with internal or external stairs (with or without device)?  Independent  Functional Cognition: Did the patient need help planning regular tasks such as shopping or remembering to take medications? Independent  Patient Information Are you of Hispanic, Latino/a,or Spanish origin?: A. No, not of Hispanic, Latino/a, or Spanish origin What is your race?: A. White Do you need or want an interpreter to communicate with a doctor or health care staff?: 0. No  Patient's Response To:  Health Literacy and Transportation Is the patient able to respond to health literacy and transportation needs?: Yes Health Literacy - How often do you need to have someone help you when you read instructions, pamphlets, or other written material from your doctor or pharmacy?: Sometimes In the past 12 months, has lack of transportation kept you from medical appointments or from getting medications?: No In the past 12 months, has lack of transportation kept you from meetings, work, or from getting things needed for daily living?: No  Development worker, international aid / [place] Devices/Equipment: None Home Equipment: Shower seat - built in  Prior Device Use: Indicate devices/aids used by the patient prior to current illness, exacerbation or injury? None of the above  Current Functional Level Cognition  Overall Cognitive Status: Within Functional Limits for tasks assessed Difficult to assess due to: Tracheostomy (no PMV present in room during session) Current Attention Level: Selective Orientation Level: Oriented X4 Following Commands: Follows one step commands consistently, Follows one step commands with increased time General Comments: Cognition appears to be close to her baseline, following 2 step commands and participating in conversations    Extremity Assessment (includes Sensation/Coordination)  Upper Extremity Assessment: RUE deficits/detail, LUE deficits/detail RUE Deficits / Details: Minimal strength, unable to lift against gravity, PROM limited due to bed  position and edema, grip weak with minimal thumb mobility. RUE Coordination: decreased fine motor, decreased gross motor LUE Deficits / Details: Minimal strength, unable to lift against gravity, PROM limited due to bed position and edema, grip weak with minimal thumb mobility. minutely stronger than RUE. LUE Coordination: decreased fine motor, decreased gross motor  Lower Extremity Assessment: Defer to PT evaluation RLE Deficits / Details: following command able to move hip, knee and ankle within limited active range, very stiff, limited by RLE Coordination: decreased fine motor LLE Deficits / Details: following command able to move hip,  knee and ankle within limited active range, very stiff LLE Coordination: decreased fine motor    ADLs  Overall ADL's : Needs assistance/impaired Eating/Feeding: Set up, Sitting Eating/Feeding Details (indicate cue type and reason): Once pt got into recliner, she needed all containers open, however then she was able to feed herself. Grooming: Set up, Wash/dry face, Wash/dry hands, Oral care, Sitting Grooming Details (indicate cue type and reason): completed in reclienr Lower Body Bathing: Total assistance, Bed level Lower Body Bathing Details (indicate cue type and reason): unable to assist due to fatigue, assisted with rolling in bed Upper Body Dressing : Maximal assistance, Sitting Upper Body Dressing Details (indicate cue type and reason): Pt attempting to doff and don hospital gown, increased weakness making it difficult for her to raise her arms to shoulder level to pull the sleeves up or down. Lower Body Dressing: Total assistance, Sitting/lateral leans Lower Body Dressing Details (indicate cue type and reason): Pt unable to bend down to don socks or underwear or to position herself in figure 4 position to assist with tasks Toilet Transfer: Maximal assistance, +2 for physical assistance, +2 for safety/equipment, Stand-pivot Toilet Transfer Details  (indicate cue type and reason): Pt able to stand from EOB with max A and required +2 to assist with pivot, was attempting to move BLE for pivot Toileting- Clothing Manipulation and Hygiene: Total assistance, Sitting/lateral lean Toileting - Clothing Manipulation Details (indicate cue type and reason): completed in bed after BM Functional mobility during ADLs: Maximal assistance, +2 for physical assistance, +2 for safety/equipment General ADL Comments: Pt with improved self efficacy this session, wanting to be more independent with tasks and grateful for setup to allow her to be more independent    Mobility  Overal bed mobility: Needs Assistance Bed Mobility: Rolling, Sidelying to Sit Rolling: Min assist Sidelying to sit: Mod assist, HOB elevated Supine to sit: Mod assist, +2 for safety/equipment Sit to supine: Mod assist General bed mobility comments: minA to roll L /R for pericare. With log roll technique, patient requires modA for trunk elevation    Transfers  Overall transfer level: Needs assistance Equipment used: 2 person hand held assist Transfers: Sit to/from Stand, Bed to chair/wheelchair/BSC Sit to Stand: Max assist, +2 physical assistance, +2 safety/equipment Bed to/from chair/wheelchair/BSC transfer type:: Stand pivot Stand pivot transfers: Max assist, +2 physical assistance, +2 safety/equipment Squat pivot transfers: Max assist  Lateral/Scoot Transfers: Total assist, +2 physical assistance Transfer via Lift Equipment: [place] transfer comment: maxA+2 to stand from bed surface with HHAx2, cues and facilitation at hips for hip extension. Unable to take steps this session so performed stand pivot    Ambulation / Gait / Stairs / Office manager / Balance Dynamic Sitting Balance Sitting balance - Comments: statically her balance is fair but dynamicaly poor Balance Overall balance assessment: Needs assistance Sitting-balance support: Single extremity  supported, Feet supported Sitting balance-Leahy Scale: Fair Sitting balance - Comments: statically her balance is fair but dynamicaly poor Postural control: Posterior lean Standing balance support: Bilateral upper extremity supported Standing balance-Leahy Scale: Zero Standing balance comment: Pt unable to hold her self up in standing without max +2 assist    Special needs/care consideration Trach size 6 cuffless and Special service needs     Previous Home Environment (from acute therapy documentation) Living Arrangements: Spouse/significant other  Lives With: Significant other Available Help at Discharge: Family, Available 24 hours/day Type of Home: House Home Layout: One level Home Access: Stairs to  enter Entrance Stairs-Rails: None Entrance Stairs-Number of Steps: 3 in front, 1 in garage Bathroom Shower/Tub: Gaffer, Chiropodist: Handicapped height (sister was unsure) Architectural technologist: Yes How Accessible: Accessible via walker [place]: No  Discharge Living Setting Plans for Discharge Living Setting: Patient's home Type of Home at Discharge: House Discharge Home Layout: One level Discharge Home Access: Stairs to enter Entrance Stairs-Rails: None Entrance Stairs-Number of Steps: 3 in front, 1 in garage Discharge Bathroom Shower/Tub: Gaffer, Tub/shower unit Discharge Bathroom Toilet: Handicapped height (sister was unsure) Discharge Bathroom Accessibility: Yes How Accessible: Accessible via walker Does the patient have any problems obtaining your medications?: No  Social/Family/Support Systems Patient Roles: Other (Comment) Anticipated Caregiver: Salley Hews, sister and other family Anticipated Caregiver's Contact Information: [phone] Caregiver Availability: 24/7 Discharge Plan Discussed with Primary Caregiver: Yes Is Caregiver In Agreement with Plan?: Yes Does Caregiver/Family have Issues with  Lodging/Transportation while Pt is in Rehab?: No  Goals Patient/Family Goal for Rehab: PT/OT Mod A Expected length of stay: 16-18 days Pt/Family Agrees to Admission and willing to participate: Yes Program Orientation Provided & Reviewed with Pt/Caregiver Including Roles  & Responsibilities: Yes  Decrease burden of Care through IP rehab admission: Specialzed equipment needs, Decrease number of caregivers, Bowel and bladder program, and Patient/family education  Possible need for SNF placement upon discharge: not anticipated  Patient Condition: I have reviewed medical records from [facility] , spoken with CM, and patient and family member. I met with patient at the bedside for inpatient rehabilitation assessment.  Patient will benefit from ongoing PT, OT, and SLP, can actively participate in 3 hours of therapy a day 5 days of the week, and can make measurable gains during the admission.  Patient will also benefit from the coordinated team approach during an Inpatient Acute Rehabilitation admission.  The patient will receive intensive therapy as well as Rehabilitation physician, nursing, social worker, and care management interventions.  Due to safety, skin/wound care, disease management, medication administration, pain management, and patient education the patient requires 24 hour a day rehabilitation nursing.  The patient is currently *** with mobility and basic ADLs.  Discharge setting and therapy post discharge at home with home health is anticipated.  Patient has agreed to participate in the Acute Inpatient Rehabilitation Program and will admit tomorrow.  Preadmission Screen Completed By:  Genella Mech, 11/05/2021 7:43 AM ______________________________________________________________________   Discussed status with Dr. Marland Kitchen on *** at *** and received approval for admission today.  Admission Coordinator:  Genella Mech, CCC-SLP, time Marland KitchenSudie Grumbling ***    Assessment/Plan: Diagnosis: Does the need for close, 24 hr/day Medical supervision in concert with the patient's rehab needs make it unreasonable for this patient to be served in a less intensive setting? {yes_no_potentially:3041433} Co-Morbidities requiring supervision/potential complications: *** Due to {due ZS:8270786}, does the patient require 24 hr/day rehab nursing? {yes_no_potentially:3041433} Does the patient require coordinated care of a physician, rehab nurse, PT, OT, and SLP to address physical and functional deficits in the context of the above medical diagnosis(es)? {yes_no_potentially:3041433} Addressing deficits in the following areas: {deficits:3041436} Can the patient actively participate in an intensive therapy program of at least 3 hrs of therapy 5 days a week? {yes_no_potentially:3041433} The potential for patient to make measurable gains while on inpatient rehab is {potential:3041437} Anticipated functional outcomes upon discharge from inpatient rehab: {functional outcomes:304600100} PT, {functional outcomes:304600100} OT, {functional outcomes:304600100} SLP Estimated rehab length of stay to reach the above functional goals is: *** Anticipated discharge  destination: {anticipated dc setting:21604} 10. Overall Rehab/Functional Prognosis: {potential:3041437}   MD Signature: ***

## 2021-11-05 NOTE — Progress Notes (Signed)
PROGRESS NOTE    [name]  [id number] DOB: 01-19-1956 DOA: 09/24/2021 PCP: Everardo Beals, NP   Brief Narrative:  [name] is a 66 y.o. female with PMH significant for GOLD 3 COPD, quit smoking 2021, uncontrolled anxiety. Patient presented to ED on 4/17 with complaint of difficulty breathing, cough, wheezing, fatigue with symptoms worsening for 1 week. EMS noted hypoxia. Patient required BiPAP in the ED and was admitted to hospitalist service, however her respiratory status continued to worsen. On 4/19, she was intubated and transferred to ICU. She was treated for pneumonia and was found to have  newly diagnosed acute systolic heart failure. Patient was unable to wean off the ventilator successfully and underwent tracheostomy placement on 5/1. She was transferred out of the ICU on 5/15. She is currently receiving tube feeds via NG tube with plans for PEG tube placement.   Assessment and Plan:  Acute respiratory failure with hypoxia and hypercapnia Tracheostomy dependent respiratory failure Patient admitted initially with COPD exacerbation and managed on BiPAP. Worsening respiratory failure lead to [facility] consult and requirement for mechanical ventilation. Patient intubated on 4/19. Prolonged ventilator wean and so tracheostomy performed on 5/1. Patient having trouble controlling her secretions. Supplemental oxygen at 8 L/min, 35% FiO2. -Continue trach care per PCCM -Continue chest physiotherapy and hypertonic saline per PCCM recommendations -Wean oxygen as able  Abdominal distension Partial SBO vs Ileus New issue. Patient with decreased flatus per her report in addition to infrequent bowel movements, even on tube feeds. Abdominal x-ray ordered 5/23 which is concerning for partial SBO. Associated abdominal pain. CT abdomen/pelvis confirms concern for partial SBO but also shows contrast from prior CT retained in the colon. General surgery consulted and are leaning more  towards ileus diagnosis. Symptoms initially improved but have worsened slightly. Passing gas. No bowel movements -General surgery recommendations: Soft diet. Signed off. -Repeat abdominal x-ray  Leukocytosis Unsure if etiology at this time but recent CT chest significant for multiple nodules concerning for infection. Recently completed antibiotics. Elevated and stable. No fevers. Neutrophil predominant. Continues to trend up. -Urine culture, tracheal aspirate -Infectious disease consult  Pulmonary nodules Concerning for possible infection. Most recent blood cultures (5/17) with no growth. Procalcitonin undetectable. Afebrile. Repeat blood cultures (5/23) are no growth to date. -Follow-up CT chest w/contrast in 3 months  Hospital acquired pneumonia, resolved. Tracheal aspirate (4/27) significant for klebsiella pneumoniae infection. Patient completed 6 days of antibiotic therapy with Ceftriaxone/Cefepime.  COPD exacerbation, resolved COPD Initial problem which led to acute respiratory failure and mechanical ventilation. Patient treated with Steroids and nebulizer treatments. Exacerbation resolved. -Continue Brovana, Pulmicort scheduled and Xopenex PRN  Acute systolic heart failure LVEF of 40% on recent Transthoracic Echocardiogram with focal WMA. Cardiology consulted with recommendation for outpatient ischemic evaluation.  Acute metabolic encephalopathy Resolved. Managed with Seroquel which is being decreased.  Severe anxiety Continue Klonopin and Seroquel  Thrombocytopenia Transient in setting of acute illness. Improved.  Severe malnutrition Cortrak feeding tube placed on 4/28. Speech therapy on board. Unable to wean off tube feeds, so plan is for placement of a PEG tube. Patient advanced to a regular diet on 5/11. Tube feeds transitioned to overnight and are now held secondary to recent vomiting. Cortrak removed secondary to clogging. Patient with improved oral intake, but recently  complicated by some nausea -Oral diet -Dietitian recommendations  Hyperlipidemia -Continue Crestor  Hyperglycemia Hemoglobin A1C of 5.4%  Sepsis Not present on admission. Secondary to recurrent pneumonia. No bacteremia. Now resolved.  Pressure injury  Medial coccyx, not present on admission.    DVT prophylaxis: Lovenox Code Status:   Code Status: Full Code Family Communication: None at bedside Disposition Plan: Discharge hopefully to CIR pending workup for leukocytosis and stability of developing bowel obstruction symptoms   Consultants:  PCCM Cardiology  Procedures:  Transthoracic Echocardiogram (09/27/21)  Antimicrobials: Ceftriaxone Cefepime    Subjective: Patient reports not feeling too well before but is feeling a bit better now. No nausea at this time. Passing gas. No bowel movement overnight. Some abdominal pain, described as gas pains.  Objective: BP 130/65 (BP Location: Left Arm)   Pulse (!) 112   Temp 99.1 F (37.3 C) (Oral)   Resp 19   Ht 5\' 1"  (1.549 m)   Wt 50.8 kg   SpO2 96%   BMI 21.16 kg/m   Examination:  General exam: Appears calm and comfortable Respiratory system: Clear to auscultation. Respiratory effort normal. Cardiovascular system: S1 & S2 heard. Tachycardia, normal rhythm. No murmurs, rubs, gallops or clicks. Gastrointestinal system: Abdomen is distended, soft and non-tender. Normal bowel sounds heard. Central nervous system: Alert and oriented. Musculoskeletal: No edema. No calf tenderness Skin: No cyanosis. No rashes Psychiatry: Judgement and insight appear normal. Mood & affect appropriate.    Data Reviewed: I have personally reviewed following labs and imaging studies  CBC Lab Results  Component Value Date   WBC 23.1 (H) 11/05/2021   RBC 3.11 (L) 11/05/2021   HGB 9.9 (L) 11/05/2021   HCT 30.4 (L) 11/05/2021   MCV 97.7 11/05/2021   MCH 31.8 11/05/2021   PLT 328 11/05/2021   MCHC 32.6 11/05/2021   RDW 15.3 11/05/2021    LYMPHSABS 2.1 10/30/2021   MONOABS 1.5 (H) 10/30/2021   EOSABS 0.0 10/30/2021   BASOSABS 0.1 [id number]     Last metabolic panel Lab Results  Component Value Date   NA 133 (L) 11/05/2021   K 2.8 (L) 11/05/2021   CL 92 (L) 11/05/2021   CO2 35 (H) 11/05/2021   BUN 5 (L) 11/05/2021   CREATININE 0.35 (L) 11/05/2021   GLUCOSE 125 (H) 11/05/2021   GFRNONAA >60 11/05/2021   GFRAA >60 05/14/2019   CALCIUM 8.6 (L) 11/05/2021   PHOS 4.0 10/25/2021   PROT 6.3 (L) 10/27/2021   ALBUMIN 1.9 (L) 10/27/2021   BILITOT 0.4 10/27/2021   ALKPHOS 200 (H) 10/27/2021   AST 38 10/27/2021   ALT 86 (H) 10/27/2021   ANIONGAP 6 11/05/2021    GFR: Estimated Creatinine Clearance: 54.3 mL/min (A) (by C-G formula based on SCr of 0.35 mg/dL (L)).  Recent Results (from the past 240 hour(s))  Culture, blood (Routine X 2) w Reflex to ID Panel     Status: None   Collection Time: 10/30/21  1:56 PM   Specimen: BLOOD  Result Value Ref Range Status   Specimen Description BLOOD RIGHT ANTECUBITAL  Final   Special Requests   Final    BOTTLES DRAWN AEROBIC AND ANAEROBIC Blood Culture results may not be optimal due to an inadequate volume of blood received in culture bottles   Culture   Final    NO GROWTH 5 DAYS Performed at [facility], [place] [address] [place], [place] [address]    Report Status 11/04/2021 FINAL  Final  Culture, blood (Routine X 2) w Reflex to ID Panel     Status: None   Collection Time: 10/30/21  1:59 PM   Specimen: BLOOD  Result Value Ref Range Status   Specimen Description BLOOD  RIGHT ANTECUBITAL  Final   Special Requests   Final    BOTTLES DRAWN AEROBIC AND ANAEROBIC Blood Culture adequate volume   Culture   Final    NO GROWTH 5 DAYS Performed at [facility], [address], [place], [place] [address]    Report Status 11/04/2021 FINAL  Final      Radiology Studies: No results found.    LOS: 41 days    Cordelia Poche, MD [facility] 11/05/2021, 9:57  AM   If 7PM-7AM, please contact night-coverage [email or web address]

## 2021-11-05 NOTE — Procedures (Signed)
Tracheostomy Change Note  Patient Details:   Name: [name] DOB: 03/11/1956 MRN: [id number]    Airway Documentation:  Routine trach changed at 2 weeks per protocol. 6 cuffless shiley replaced with new 6 cuffless shiley with no problems. Placement confirmed with positive color change on EZCap manometer & bilateral BS. Assisted by Daivd Council RRT.   Evaluation  O2 sats: stable throughout Complications: No apparent complications Patient did tolerate procedure well. Bilateral Breath Sounds: Clear, Diminished    Jacqulynn Cadet 11/05/2021, 4:22 PM

## 2021-11-05 NOTE — Consult Note (Signed)
Date of Admission:  09/24/2021          Reason for Consult: Leukocytosis and lung nodules    Referring Provider: Cordelia Poche, MD   Assessment:  Leukocytosis --likely secondary to partial small bowel obstruction Lung nodules of unknown certain significance but surely not causing her leukocytosis Acute on chronic respiratory failure now post tracheostomy and on high flow oxygen History of multiple abdominal surgeries and now with partial small bowel obstruction  Plan:  Would confer with general surgery with regards to management of her small bowel obstruction I discontinued her urine cultures as she does not have urinary symptoms I have ordered a serum cryptococcal antigen as well as urine histoplasma antigen and Blastomyces antigen and a serum LDH.  To work-up lung nodule I would not pursue aggressive work-up of the lung nodules with bronchoscopy or VATS and I do not think they have any bearing on her current condition I see no indication for antibiotics this point in time.  I will off at this time please call with further questions.  Principal Problem:   Acute respiratory failure with hypoxia and hypercapnia (HCC) Active Problems:   COPD exacerbation (HCC)   Hypokalemia   Anxiety   GERD (gastroesophageal reflux disease)   Hyperglycemia   Lactic acidosis   Hypernatremia   Heart failure with mid-range ejection fraction (HFmEF) (HCC)   Protein-calorie malnutrition, severe   Acute encephalopathy   Pressure injury of skin   Scheduled Meds:  albuterol  2.5 mg Nebulization BID   arformoterol  15 mcg Nebulization BID   bisacodyl  10 mg Rectal Daily   budesonide (PULMICORT) nebulizer solution  0.5 mg Nebulization BID   chlorhexidine gluconate (MEDLINE KIT)  15 mL Mouth Rinse BID   clonazePAM  0.5 mg Per Tube TID   docusate sodium  100 mg Oral BID   enoxaparin (LOVENOX) injection  40 mg Subcutaneous Q24H   feeding supplement  237 mL Oral TID BM   Gerhardt's butt cream    Topical QID   metoprolol tartrate  12.5 mg Per Tube BID   montelukast  10 mg Per Tube q AM   oxyCODONE  5 mg Per Tube Q8H   polyethylene glycol  17 g Per Tube BID   potassium chloride  40 mEq Oral Q4H   QUEtiapine  75 mg Per Tube BID   revefenacin  175 mcg Nebulization Daily   rosuvastatin  20 mg Per NG tube Daily   senna  1 tablet Per Tube Daily   sodium chloride HYPERTONIC  4 mL Nebulization BID   Continuous Infusions:  sodium chloride Stopped (10/17/21 0748)   sodium chloride     PRN Meds:.sodium chloride, acetaminophen **OR** acetaminophen, docusate, guaiFENesin-dextromethorphan, levalbuterol, liver oil-zinc oxide, LORazepam, morphine injection, ondansetron (ZOFRAN) IV, prochlorperazine, white petrolatum  HPI:   NOTE: My history is constrained by the fact that the patient could not use the Passy-Muir valve much because of the fact that she is desaturating rapidly when hooked up to high flow oxygen.  Therefore I could not take a review of systems   [name] is a 66 y.o. female with history of COPD anxiety who was admitted to the emerged part on April 17 with difficulty breathing cough wheezing fatigue and hypoxic.  Patient was treated with BiPAP and treated for COPD exacerbation.  On the 19th unfortunately she required intubation and mechanical ventilation and transfer to the ICU.  She was diagnosed with pneumonia and also and also  with acute systolic heart failure.   She was unable to be weaned on the ventilator and underwent tracheostomy placement on Oct 08, 2021.  She was transferred to the ICU on May 15.  She received tube feeds via NG tube with plans for PEG placement.  She has been treated multiple times for pneumonia throughout this hospitalization.  She is also been struggling with ileus versus partial small bowel obstruction and KUB again today suggests current small bowel obstruction yet again.  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  She has been seen by general surgery for small bowel obstruction during this admission.  She has recently been afebrile but continues to have fluctuating leukocytosis which when I reviewed the chart has been going on for most of the entire month of May.  She had CT of the chest performed r on Oct 30, 2021 does not show any parenchymal lung disease but does show some nodules in the right lower lobe they are also seen on scan on Oct 28, 2021.  Additionally with dilated loops of small bowel were noted to extend to the pelvis and near her surgical anastomotic site again concerning for partial small bowel bowel obstruction.  I noted that urine culture was being ordered but the patient denied dysuria when I spoke with her.  She also did not have suprapubic tenderness on exam.  She has some back pain from lying in bed but does not have anything in my view to suggest urinary tract infection so I have discontinued that culture as I think it would lead to unnecessary antibiotics.  I would suspect that her leukocytosis is related to her problems with ileus and small bowel obstruction that have been going on for some time now.  Would defer management to general surgery.  Could also have leukocytosis through chronic aspiration.  Does not appear to have an aspiration pneumonia at this point in time.  She is not on corticosteroids currently that can also of course elevated her white blood cell count.    With regards to her lung nodules these could certainly be due to any number of entities including infectious and noninfectious etiologies.  I will send off a serum cryptococcal antigen urine histoplasma antigen and Blastomyces antigen as well as an LDH.  I think would be very dangerous and unwise to try to pursue aggressive work-up of these nodules at this time as require bronchoscopy and potentially even VATS.  I  spent 90 minutes with the patient including than 50% of the time in face to face counseling of the patient guarding the reasons for my consultation including work-up for her leukocytosis her lung nodules personally reviewing of the chest as well as plain films updated culture data CBC CMP along with review of medical records in preparation for the visit and during the visit and in coordination of her care.  I will sign off for now please call one of my colleagues if there are further concerns. Review of Systems: Review of Systems  Unable to perform ROS: Other   Past Medical History:  Diagnosis Date   Anxiety    Chronic back pain    COPD (chronic obstructive pulmonary disease) (HCC)    Tobacco abuse     Social History   Tobacco Use   Smoking status: Former    Packs/day: 2.00    Years: 45.00    Pack years:  90.00    Types: Cigarettes    Quit date: 04/11/2019    Years since quitting: 2.5   Smokeless tobacco: Never  Vaping Use   Vaping Use: Never used  Substance Use Topics   Alcohol use: No   Drug use: Never    Family History  Problem Relation Age of Onset   Atrial fibrillation Sister    No Known Allergies  OBJECTIVE: Blood pressure 130/65, pulse (!) 112, temperature 99.1 F (37.3 C), temperature source Oral, resp. rate 19, height '5\' 1"'  (1.549 m), weight 50.8 kg, SpO2 96 %.  Physical Exam Constitutional:      General: She is not in acute distress.    Appearance: Normal appearance. She is underweight. She is ill-appearing. She is not diaphoretic.  HENT:     Head: Normocephalic and atraumatic.     Right Ear: Hearing and external ear normal.     Left Ear: Hearing and external ear normal.     Nose: No nasal deformity or rhinorrhea.  Eyes:     General: No scleral icterus.    Extraocular Movements: Extraocular movements intact.     Conjunctiva/sclera: Conjunctivae normal.     Right eye: Right conjunctiva is not injected.     Left eye: Left conjunctiva is not injected.      Pupils: Pupils are equal, round, and reactive to light.  Neck:     Vascular: No JVD.  Cardiovascular:     Rate and Rhythm: Regular rhythm. Tachycardia present.     Heart sounds: Normal heart sounds, S1 normal and S2 normal. No murmur heard.   No friction rub. No gallop.  Pulmonary:     Effort: No respiratory distress.     Breath sounds: Rhonchi present. No wheezing.  Abdominal:     General: Bowel sounds are normal. There is distension.     Palpations: Abdomen is soft.     Tenderness: There is no abdominal tenderness.  Musculoskeletal:        General: Normal range of motion.     Right shoulder: Normal.     Left shoulder: Normal.     Cervical back: Normal range of motion and neck supple.     Right hip: Normal.     Left hip: Normal.     Right knee: Normal.     Left knee: Normal.  Lymphadenopathy:     Head:     Right side of head: No submandibular, preauricular or posterior auricular adenopathy.     Left side of head: No submandibular, preauricular or posterior auricular adenopathy.     Cervical: No cervical adenopathy.     Right cervical: No superficial or deep cervical adenopathy.    Left cervical: No superficial or deep cervical adenopathy.  Skin:    General: Skin is warm and dry.     Coloration: Skin is pale.     Findings: Bruising present. No abrasion, ecchymosis, erythema, lesion or rash.     Nails: There is no clubbing.  Neurological:     General: No focal deficit present.     Mental Status: She is alert and oriented to person, place, and time.     Sensory: No sensory deficit.     Coordination: Coordination normal.     Gait: Gait normal.  Psychiatric:        Attention and Perception: Attention normal. She is attentive.        Mood and Affect: Mood is anxious.        Speech: Speech normal.  Behavior: Behavior normal. Behavior is cooperative.        Thought Content: Thought content normal.        Judgment: Judgment normal.    Lab Results Lab Results   Component Value Date   WBC 23.1 (H) 11/05/2021   HGB 9.9 (L) 11/05/2021   HCT 30.4 (L) 11/05/2021   MCV 97.7 11/05/2021   PLT 328 11/05/2021    Lab Results  Component Value Date   CREATININE 0.35 (L) 11/05/2021   BUN 5 (L) 11/05/2021   NA 133 (L) 11/05/2021   K 2.8 (L) 11/05/2021   CL 92 (L) 11/05/2021   CO2 35 (H) 11/05/2021    Lab Results  Component Value Date   ALT 86 (H) 10/27/2021   AST 38 10/27/2021   ALKPHOS 200 (H) 10/27/2021   BILITOT 0.4 10/27/2021     Microbiology: Recent Results (from the past 240 hour(s))  Culture, blood (Routine X 2) w Reflex to ID Panel     Status: None   Collection Time: 10/30/21  1:56 PM   Specimen: BLOOD  Result Value Ref Range Status   Specimen Description BLOOD RIGHT ANTECUBITAL  Final   Special Requests   Final    BOTTLES DRAWN AEROBIC AND ANAEROBIC Blood Culture results may not be optimal due to an inadequate volume of blood received in culture bottles   Culture   Final    NO GROWTH 5 DAYS Performed at [facility], [place] [address] [place], [place] [address]    Report Status 11/04/2021 FINAL  Final  Culture, blood (Routine X 2) w Reflex to ID Panel     Status: None   Collection Time: 10/30/21  1:59 PM   Specimen: BLOOD  Result Value Ref Range Status   Specimen Description BLOOD RIGHT ANTECUBITAL  Final   Special Requests   Final    BOTTLES DRAWN AEROBIC AND ANAEROBIC Blood Culture adequate volume   Culture   Final    NO GROWTH 5 DAYS Performed at [facility], [place] [address], [place], [place] [address]    Report Status 11/04/2021 FINAL  Final    Alcide Evener, [place] [facility] [place] [facility] [phone] pager  11/05/2021, 12:01 PM

## 2021-11-05 NOTE — Progress Notes (Signed)
Speech Language Pathology Treatment: [name] Speaking valve  Patient Details Name: [name] MRN: [id number] DOB: 11-May-1956 Today's Date: 11/05/2021 Time: 2094-7096 SLP Time Calculation (min) (ACUTE ONLY): 13 min  Assessment / Plan / Recommendation Clinical Impression  Goal of session was to teach pt to donn and doff speaking valve. When therapist arrived RN tech present and pt not using valve but using alphabet board to communicate. SLP inquired and tech stated pt desatting with valve and therefore removed and sats were 90-91% at that time. Therapist donned valve for intervals of 3 min and SpO2 around 86-89% (mostly 88-89%). She did not appear in distress but drowsy and mildly confused this afternoon. She requested Ensure drinking from straw without related dip in sats (no longer following for swallow).Suspect she may have been a bit anxious in combination with drowsiness that led to lower SpO2. Valve doffed and pt reclined to take a nap. Staff may use valve and continue to monitor her and remove if sats stay consistently below 88% (she has COPD) or appears in distress. Her speech is 100% intelligible when donned and coordinating respirations and speech. Continue ST   HPI HPI: Pt is a 66 yo female presenting to [facility] with acute hypoxic/hypercarbic respiratory failure requiring intubation 4/19 followed by transfer to [facility] after new finding of LV dysfunction. Trach 5/1. PMH includes: COPD, previously on home O2 but "took herself off," former smoker (quit 2021), uncontrolled anxiety      SLP Plan  Continue with current plan of care      Recommendations for follow up therapy are one component of a multi-disciplinary discharge planning process, led by the attending physician.  Recommendations may be updated based on patient status, additional functional criteria and insurance authorization.    Recommendations                   Oral Care Recommendations: Oral care BID Follow Up  Recommendations: Skilled nursing-short term rehab (<3 hours/day) Assistance recommended at discharge: Frequent or constant Supervision/Assistance SLP Visit Diagnosis: Aphonia (R49.1) Plan: Continue with current plan of care           Royce Macadamia  11/05/2021, 2:34 PM

## 2021-11-05 NOTE — Progress Notes (Signed)
Inpatient Rehab Admissions Coordinator:     I do not yet have insurance auth or a CIR bed for this pt. Today I will continue to follow for potential admit pending insurance auth.   Cashus Halterman, MS, CCC-SLP Rehab Admissions Coordinator  [phone] (celll) [phone] (office)  

## 2021-11-05 NOTE — Progress Notes (Signed)
Occupational Therapy Treatment Patient Details Name: [name] MRN: [id number] DOB: 1956/03/23 Today's Date: 11/05/2021   History of present illness 66 y/o F reports diagnosis of PNA on 4/16 Admitted 4/17 to [facility], ICU with acute hypoxic/hypercarbic respiratory failure, failed BiPAP and required mechanical ventilation, transferred to [facility] 4/21 due to new finding of LV dysfunction Echo 4/20 with EF 40% , akinesis of apex and septal wall. . Failed weaning attempt 4/28. Trach placed on 5/1. PMH significant for COPD, tobacco use, HLD, GRED, and uncontrolled anxiety.   OT comments  Pt progressing towards established OT goals. Pt donning her socks at bed level with use of figure four and Max A. Pt performing bed mobility with Mod A and then sitting at EOB with Min Guard-Min A. Requiring Max A for squat pivot to recliner. Pt benefiting from increased time and calming cues. Once in recliner, pt participating in self feeding with increased time and set up. Pt reporting nausea and RN present at end of session. Continue to recommend dc to AIR and will continue to follow acutely as admitted.   Recommendations for follow up therapy are one component of a multi-disciplinary discharge planning process, led by the attending physician.  Recommendations may be updated based on patient status, additional functional criteria and insurance authorization.    Follow Up Recommendations  Acute inpatient rehab (3hours/day)    Assistance Recommended at Discharge Frequent or constant Supervision/Assistance  Patient can return home with the following  Two people to help with walking and/or transfers;Two people to help with bathing/dressing/bathroom;Assistance with cooking/housework;Assistance with feeding;Direct supervision/assist for medications management;Direct supervision/assist for financial management;Assist for transportation;Help with stairs or ramp for entrance   Equipment Recommendations  Other (comment)     Recommendations for Other Services Rehab consult    Precautions / Restrictions Precautions Precautions: Fall Precaution Comments: trach, cortrak Restrictions Weight Bearing Restrictions: No       Mobility Bed Mobility Overal bed mobility: Needs Assistance Bed Mobility: Supine to Sit   Sidelying to sit: Mod assist, HOB elevated, +2 for safety/equipment       General bed mobility comments: Mod A for bringing BLEs to EOB and then elevating trunk    Transfers Overall transfer level: Needs assistance Equipment used: 1 person hand held assist Transfers: Sit to/from Stand, Bed to chair/wheelchair/BSC Sit to Stand: Max assist   Squat pivot transfers: Max assist       General transfer comment: Max A for power up and then turn to recliner     Balance Overall balance assessment: Needs assistance Sitting-balance support: Single extremity supported, Feet supported Sitting balance-Leahy Scale: Fair     Standing balance support: Bilateral upper extremity supported, During functional activity Standing balance-Leahy Scale: Zero                             ADL either performed or assessed with clinical judgement   ADL Overall ADL's : Needs assistance/impaired Eating/Feeding: Set up;Sitting                   Lower Body Dressing: Maximal assistance;Bed level Lower Body Dressing Details (indicate cue type and reason): Figure four to don socks. Max A to initate and maintain position Toilet Transfer: Maximal assistance;Squat-pivot           Functional mobility during ADLs: Maximal assistance General ADL Comments: Pt donning socks at bed level and then performing stand pivot to recliner. Set up for self feeding  at recliner; RN present at end of session due to nausea    Extremity/Trunk Assessment Upper Extremity Assessment Upper Extremity Assessment: RUE deficits/detail;LUE deficits/detail RUE Deficits / Details: Minimal movement against gravity. RUE  Coordination: decreased fine motor;decreased gross motor LUE Deficits / Details: Minimal movement against gravity. LUE Coordination: decreased fine motor;decreased gross motor   Lower Extremity Assessment Lower Extremity Assessment: Defer to PT evaluation        Vision       Perception     Praxis      Cognition Arousal/Alertness: Awake/alert Behavior During Therapy: WFL for tasks assessed/performed, Anxious Overall Cognitive Status: Impaired/Different from baseline Area of Impairment: Awareness, Problem solving                           Awareness: Emergent Problem Solving: Slow processing, Requires verbal cues, Requires tactile cues General Comments: Requiring increased time. Slight anxiety with mobility but able to redirect and calm with soothing cues        Exercises      Shoulder Instructions       General Comments SpO2 dropping to 85% when PMV placed; SpO2 elevating back to 90s with PMV off and trach collar in place    Pertinent Vitals/ Pain       Pain Assessment Pain Assessment: Faces Faces Pain Scale: No hurt Pain Descriptors / Indicators: Sore, Grimacing Pain Intervention(s): Monitored during session, Limited activity within patient's tolerance, Repositioned  Home Living                                          Prior Functioning/Environment              Frequency  Min 2X/week        Progress Toward Goals  OT Goals(current goals can now be found in the care plan section)  Progress towards OT goals: Progressing toward goals  Acute Rehab OT Goals OT Goal Formulation: With patient Time For Goal Achievement: 11/19/21 Potential to Achieve Goals: Good ADL Goals Additional ADL Goal #1: Pt will perform bed mobility with Min A in preparation for ADLs Additional ADL Goal #2: Pt will locate three grooming items with 2-3 cues Additional ADL Goal #3: Pt will perform AAROM of BUEs exercises with Mod A Additional ADL Goal  #4: Pt will hold and utilize tooth brush sponge to sucessfully brush her teeth and mouth with min A.  Plan Discharge plan needs to be updated;Frequency remains appropriate    Co-evaluation                 AM-PAC OT "6 Clicks" Daily Activity     Outcome Measure   Help from another person eating meals?: A Little Help from another person taking care of personal grooming?: A Lot Help from another person toileting, which includes using toliet, bedpan, or urinal?: A Lot Help from another person bathing (including washing, rinsing, drying)?: A Lot Help from another person to put on and taking off regular upper body clothing?: A Lot Help from another person to put on and taking off regular lower body clothing?: A Lot 6 Click Score: 13    End of Session Equipment Utilized During Treatment: Gait belt  OT Visit Diagnosis: Unsteadiness on feet (R26.81);Other abnormalities of gait and mobility (R26.89);Muscle weakness (generalized) (M62.81)   Activity Tolerance Patient tolerated treatment well   Patient  Left in chair;with call bell/phone within reach;with chair alarm set;with nursing/sitter in room   Nurse Communication Mobility status        Time: 7829-5621 OT Time Calculation (min): 45 min  Charges: OT General Charges $OT Visit: 1 Visit OT Treatments $Self Care/Home Management : 38-52 mins  Felicia Both MSOT, OTR/L Acute Rehab Pager: [phone] Office: [phone]  Theodoro Grist Evelin Cake 11/05/2021, 5:50 PM

## 2021-11-06 ENCOUNTER — Inpatient Hospital Stay (HOSPITAL_COMMUNITY): Payer: Medicare Other

## 2021-11-06 ENCOUNTER — Encounter (HOSPITAL_COMMUNITY): Payer: Medicare Other

## 2021-11-06 DIAGNOSIS — I5022 Chronic systolic (congestive) heart failure: Secondary | ICD-10-CM | POA: Diagnosis not present

## 2021-11-06 DIAGNOSIS — J441 Chronic obstructive pulmonary disease with (acute) exacerbation: Secondary | ICD-10-CM | POA: Diagnosis not present

## 2021-11-06 DIAGNOSIS — J9602 Acute respiratory failure with hypercapnia: Secondary | ICD-10-CM | POA: Diagnosis not present

## 2021-11-06 DIAGNOSIS — R739 Hyperglycemia, unspecified: Secondary | ICD-10-CM | POA: Diagnosis not present

## 2021-11-06 DIAGNOSIS — J9601 Acute respiratory failure with hypoxia: Secondary | ICD-10-CM | POA: Diagnosis not present

## 2021-11-06 DIAGNOSIS — G9340 Encephalopathy, unspecified: Secondary | ICD-10-CM | POA: Diagnosis not present

## 2021-11-06 LAB — CBC
HCT: 32 % — ABNORMAL LOW (ref 36.0–46.0)
Hemoglobin: 10.4 g/dL — ABNORMAL LOW (ref 12.0–15.0)
MCH: 31.8 pg (ref 26.0–34.0)
MCHC: 32.5 g/dL (ref 30.0–36.0)
MCV: 97.9 fL (ref 80.0–100.0)
Platelets: 341 10*3/uL (ref 150–400)
RBC: 3.27 MIL/uL — ABNORMAL LOW (ref 3.87–5.11)
RDW: 15.6 % — ABNORMAL HIGH (ref 11.5–15.5)
WBC: 25.5 10*3/uL — ABNORMAL HIGH (ref 4.0–10.5)
nRBC: 0 % (ref 0.0–0.2)

## 2021-11-06 LAB — BASIC METABOLIC PANEL
Anion gap: 9 (ref 5–15)
BUN: 9 mg/dL (ref 8–23)
CO2: 32 mmol/L (ref 22–32)
Calcium: 9 mg/dL (ref 8.9–10.3)
Chloride: 90 mmol/L — ABNORMAL LOW (ref 98–111)
Creatinine, Ser: 0.37 mg/dL — ABNORMAL LOW (ref 0.44–1.00)
GFR, Estimated: >60 mL/min (ref >60)
Glucose, Bld: 120 mg/dL — ABNORMAL HIGH (ref 70–99)
Potassium: 3.7 mmol/L (ref 3.5–5.1)
Sodium: 131 mmol/L — ABNORMAL LOW (ref 135–145)

## 2021-11-06 LAB — MAGNESIUM: Magnesium: 1.8 mg/dL (ref 1.7–2.4)

## 2021-11-06 MED ORDER — DIATRIZOATE MEGLUMINE & SODIUM 66-10 % PO SOLN
90.0000 mL | Freq: Once | ORAL | Status: DC
  Filled 2021-11-06: qty 90

## 2021-11-06 MED ORDER — POLYETHYLENE GLYCOL 3350 17 G PO PACK
17.0000 g | PACK | Freq: Two times a day (BID) | ORAL | Status: DC
  Administered 2021-11-06 – 2021-11-14 (×10): 17 g via ORAL
  Filled 2021-11-06 (×14): qty 1

## 2021-11-06 MED ORDER — GUAIFENESIN-DM 100-10 MG/5ML PO SYRP
5.0000 mL | ORAL_SOLUTION | ORAL | Status: DC | PRN
  Administered 2021-11-13: 5 mL via ORAL
  Filled 2021-11-06: qty 5

## 2021-11-06 MED ORDER — MAGNESIUM SULFATE IN D5W 1-5 GM/100ML-% IV SOLN
1.0000 g | Freq: Once | INTRAVENOUS | Status: AC
  Administered 2021-11-06: 1 g via INTRAVENOUS
  Filled 2021-11-06: qty 100

## 2021-11-06 MED ORDER — SENNA 8.6 MG PO TABS
1.0000 | ORAL_TABLET | Freq: Every day | ORAL | Status: DC
  Administered 2021-11-06 – 2021-11-14 (×8): 8.6 mg via ORAL
  Filled 2021-11-06 (×9): qty 1

## 2021-11-06 MED ORDER — QUETIAPINE FUMARATE 25 MG PO TABS
75.0000 mg | ORAL_TABLET | Freq: Two times a day (BID) | ORAL | Status: DC
Start: 2021-11-06 — End: 2021-11-14
  Administered 2021-11-06 – 2021-11-14 (×17): 75 mg via ORAL
  Filled 2021-11-06 (×2): qty 1
  Filled 2021-11-06: qty 3
  Filled 2021-11-06 (×10): qty 1
  Filled 2021-11-06: qty 3
  Filled 2021-11-06 (×4): qty 1

## 2021-11-06 MED ORDER — DOCUSATE SODIUM 50 MG/5ML PO LIQD: 100.0000 mg | Freq: Two times a day (BID) | ORAL | Status: DC | PRN

## 2021-11-06 MED ORDER — ACETAMINOPHEN 325 MG PO TABS
650.0000 mg | ORAL_TABLET | Freq: Four times a day (QID) | ORAL | Status: DC | PRN
Start: 2021-11-06 — End: 2021-11-14
  Administered 2021-11-10 – 2021-11-12 (×2): 650 mg via ORAL
  Filled 2021-11-06 (×3): qty 2

## 2021-11-06 MED ORDER — METOPROLOL TARTRATE 12.5 MG HALF TABLET
12.5000 mg | ORAL_TABLET | Freq: Two times a day (BID) | ORAL | Status: DC
  Administered 2021-11-06 – 2021-11-14 (×16): 12.5 mg via ORAL
  Filled 2021-11-06 (×17): qty 1

## 2021-11-06 MED ORDER — POTASSIUM CHLORIDE 10 MEQ/100ML IV SOLN
10.0000 meq | INTRAVENOUS | Status: AC
  Administered 2021-11-06 (×4): 10 meq via INTRAVENOUS
  Filled 2021-11-06 (×4): qty 100

## 2021-11-06 MED ORDER — CLONAZEPAM 0.5 MG PO TABS
0.5000 mg | ORAL_TABLET | Freq: Three times a day (TID) | ORAL | Status: DC
  Administered 2021-11-06 – 2021-11-14 (×25): 0.5 mg via ORAL
  Filled 2021-11-06 (×25): qty 1

## 2021-11-06 MED ORDER — MONTELUKAST SODIUM 10 MG PO TABS
10.0000 mg | ORAL_TABLET | Freq: Every morning | ORAL | Status: DC
Start: 2021-11-07 — End: 2021-11-14
  Administered 2021-11-07 – 2021-11-14 (×8): 10 mg via ORAL
  Filled 2021-11-06 (×8): qty 1

## 2021-11-06 MED ORDER — OXYCODONE HCL 5 MG PO TABS
5.0000 mg | ORAL_TABLET | Freq: Three times a day (TID) | ORAL | Status: DC
  Administered 2021-11-06 – 2021-11-14 (×24): 5 mg via ORAL
  Filled 2021-11-06 (×24): qty 1

## 2021-11-06 MED ORDER — ROSUVASTATIN CALCIUM 20 MG PO TABS
20.0000 mg | ORAL_TABLET | Freq: Every day | ORAL | Status: DC
  Administered 2021-11-06 – 2021-11-14 (×9): 20 mg via ORAL
  Filled 2021-11-06 (×9): qty 1

## 2021-11-06 MED ORDER — DIATRIZOATE MEGLUMINE & SODIUM 66-10 % PO SOLN
90.0000 mL | Freq: Once | ORAL | Status: AC
  Administered 2021-11-06: 90 mL via ORAL
  Filled 2021-11-06: qty 90

## 2021-11-06 MED ORDER — ACETAMINOPHEN 650 MG RE SUPP
650.0000 mg | Freq: Four times a day (QID) | RECTAL | Status: DC | PRN
Start: 2021-11-06 — End: 2021-11-14

## 2021-11-06 NOTE — Progress Notes (Signed)
Physical Therapy Treatment Patient Details Name: [name] MRN: [id number] DOB: [birth year] Today's Date: 11/06/2021   History of Present Illness 66 y/o F reports diagnosis of PNA on 4/16 Admitted 4/17 to [facility], ICU with acute hypoxic/hypercarbic respiratory failure, failed BiPAP and required mechanical ventilation, transferred to [facility] 4/21 due to new finding of LV dysfunction Echo 4/20 with EF 40% , akinesis of apex and septal wall. . Failed weaning attempt 4/28. Trach placed on 5/1. PMH significant for COPD, tobacco use, HLD, GRED, and uncontrolled anxiety.    PT Comments    Continuing work on functional mobility and activity tolerance;  Pt was alert and particiapting well this session, which focused on functional transfers; Cues for technqiue to prep for and perform stand pivot (almost step pivot) transfer from recliner to pt's bed on her R; Pt was able to pull from supported sitting to unsupported sitting in the recliner with minguard assist, pulling on armrests; Max assist to stand, turn, and sit back to the bed; Noted pt took a small step with her R foot towards the bed during trnasfer, and she was quite pleased with that; Continue to recommend acute inpatient rehab (AIR) for post-acute therapy needs.    Recommendations for follow up therapy are one component of a multi-disciplinary discharge planning process, led by the attending physician.  Recommendations may be updated based on patient status, additional functional criteria and insurance authorization.  Follow Up Recommendations  Acute inpatient rehab (3hours/day)     Assistance Recommended at Discharge Frequent or constant Supervision/Assistance  Patient can return home with the following A lot of help with walking and/or transfers;A lot of help with bathing/dressing/bathroom;Help with stairs or ramp for entrance;Assist for transportation;Assistance with cooking/housework   Equipment Recommendations  BSC/3in1;Wheelchair  (measurements PT);Wheelchair cushion (measurements PT);Other (comment);Hospital bed    Recommendations for Other Services       Precautions / Restrictions Precautions Precautions: Fall Precaution Comments: Lurline Idol, to get NG tube today for possible new obstruction     Mobility  Bed Mobility Overal bed mobility: Needs Assistance Bed Mobility: Sit to Supine (HOB elevated)       Sit to supine: Mod assist   General bed mobility comments: Mod assist to help LEs into bed    Transfers Overall transfer level: Needs assistance Equipment used: 1 person hand held assist (second person for lines) Transfers: Sit to/from Stand, Bed to chair/wheelchair/BSC Sit to Stand: Max assist Stand pivot transfers: Max assist         General transfer comment: Max A for power up and then turn to bed; Noted pt able to step her RLE towards the bed during transfer    Ambulation/Gait                   Stairs             Wheelchair Mobility    Modified Rankin (Stroke Patients Only)       Balance     Sitting balance-Leahy Scale: Good       Standing balance-Leahy Scale: Zero                              Cognition Arousal/Alertness: Awake/alert Behavior During Therapy: WFL for tasks assessed/performed Overall Cognitive Status: Within Functional Limits for tasks assessed (for simple mobiltiy)  Exercises      General Comments General comments (skin integrity, edema, etc.): Tolerated PMV well the entire session      Pertinent Vitals/Pain Pain Assessment Pain Assessment: No/denies pain    Home Living                          Prior Function            PT Goals (current goals can now be found in the care plan section) Acute Rehab PT Goals Patient Stated Goal: Seemed pleased that she took a step during trnasfer PT Goal Formulation: With patient/family Time For Goal Achievement:  11/20/21 Potential to Achieve Goals: Fair Progress towards PT goals: Progressing toward goals    Frequency    Min 3X/week      PT Plan Current plan remains appropriate    Co-evaluation              AM-PAC PT "6 Clicks" Mobility   Outcome Measure  Help needed turning from your back to your side while in a flat bed without using bedrails?: A Lot Help needed moving from lying on your back to sitting on the side of a flat bed without using bedrails?: A Lot Help needed moving to and from a bed to a chair (including a wheelchair)?: Total Help needed standing up from a chair using your arms (e.g., wheelchair or bedside chair)?: Total Help needed to walk in hospital room?: Total Help needed climbing 3-5 steps with a railing? : Total 6 Click Score: 8    End of Session Equipment Utilized During Treatment: Gait belt;Oxygen Activity Tolerance: Patient tolerated treatment well Patient left: in bed;with call bell/phone within reach;with nursing/sitter in room Nurse Communication: Mobility status PT Visit Diagnosis: Other abnormalities of gait and mobility (R26.89);Muscle weakness (generalized) (M62.81) Pain - part of body:  (generalized)     Time: [id number] PT Time Calculation (min) (ACUTE ONLY): 11 min  Charges:  $Therapeutic Activity: 8-22 mins                     Roney Marion, PT  Acute Rehabilitation Services Office [phone]    Colletta Maryland 11/06/2021, 5:18 PM

## 2021-11-06 NOTE — Progress Notes (Signed)
PROGRESS NOTE    [name]  [id number] DOB: 05/28/1956 DOA: 09/24/2021 PCP: Everardo Beals, NP   Brief Narrative:  [name] is a 66 y.o. female with PMH significant for GOLD 3 COPD, quit smoking 2021, uncontrolled anxiety. Patient presented to ED on 4/17 with complaint of difficulty breathing, cough, wheezing, fatigue with symptoms worsening for 1 week. EMS noted hypoxia. Patient required BiPAP in the ED and was admitted to hospitalist service, however her respiratory status continued to worsen. On 4/19, she was intubated and transferred to ICU. She was treated for pneumonia and was found to have  newly diagnosed acute systolic heart failure. Patient was unable to wean off the ventilator successfully and underwent tracheostomy placement on 5/1. She was transferred out of the ICU on 5/15. She is currently receiving tube feeds via NG tube with plans for PEG tube placement.   Assessment and Plan:  Acute respiratory failure with hypoxia and hypercapnia Tracheostomy dependent respiratory failure Patient admitted initially with COPD exacerbation and managed on BiPAP. Worsening respiratory failure lead to [facility] consult and requirement for mechanical ventilation. Patient intubated on 4/19. Prolonged ventilator wean and so tracheostomy performed on 5/1. Patient having trouble controlling her secretions. Supplemental oxygen at 8 L/min, 35% FiO2. Chest physiotherapy discontinued. -Continue trach care per PCCM -Wean oxygen as able  Abdominal distension Partial SBO vs Ileus New issue. Patient with decreased flatus per her report in addition to infrequent bowel movements, even on tube feeds. Abdominal x-ray ordered 5/23 which is concerning for partial SBO. Associated abdominal pain. CT abdomen/pelvis confirms concern for partial SBO but also shows contrast from prior CT retained in the colon. General surgery consulted and are leaning more towards ileus diagnosis. Symptoms initially  improved but have worsened slightly. Passing gas. Had a small bowel movement on 5/29. Repeat abdominal x-ray with recurrent pSBO. Abdomen is slightly softer today -General surgery recommendations: Soft diet. Signed off. -Deescalate to a clear liquid diet; may need NG tube with gastrografin  -Re-consult general surgery  Leukocytosis Unsure if etiology at this time but recent CT chest significant for multiple nodules concerning for infection. Recently completed antibiotics. Elevated and stable. No fevers. Neutrophil predominant. Continues to trend up. Possibly related to pSBO. Infectious disease consulted. -Urine culture, tracheal aspirate -Infectious disease consult  Pulmonary nodules Concerning for possible infection. Most recent blood cultures (5/17) with no growth. Procalcitonin undetectable. Afebrile. Repeat blood cultures (5/23) are no growth to date. -Follow-up CT chest w/contrast in 3 months  Hospital acquired pneumonia, resolved. Tracheal aspirate (4/27) significant for klebsiella pneumoniae infection. Patient completed 6 days of antibiotic therapy with Ceftriaxone/Cefepime.  COPD exacerbation, resolved COPD Initial problem which led to acute respiratory failure and mechanical ventilation. Patient treated with Steroids and nebulizer treatments. Exacerbation resolved. -Continue Brovana, Pulmicort scheduled and Xopenex PRN  Acute systolic heart failure LVEF of 40% on recent Transthoracic Echocardiogram with focal WMA. Cardiology consulted with recommendation for outpatient ischemic evaluation.  Acute metabolic encephalopathy Resolved. Managed with Seroquel which is being decreased.  Severe anxiety Continue Klonopin and Seroquel  Thrombocytopenia Transient in setting of acute illness. Improved.  Severe malnutrition Cortrak feeding tube placed on 4/28. Speech therapy on board. Unable to wean off tube feeds, so plan is for placement of a PEG tube. Patient advanced to a regular  diet on 5/11. Tube feeds transitioned to overnight and are now held secondary to recent vomiting. Cortrak removed secondary to clogging. Patient with improved oral intake, but recently complicated by some nausea -Oral diet -  Dietitian recommendations  Hyperlipidemia -Continue Crestor  Hyperglycemia Hemoglobin A1C of 5.4%  Sepsis Not present on admission. Secondary to recurrent pneumonia. No bacteremia. Now resolved.  Pressure injury Medial coccyx, not present on admission.    DVT prophylaxis: Lovenox Code Status:   Code Status: Full Code Family Communication: None at bedside Disposition Plan: Discharge hopefully to CIR pending workup for leukocytosis and stability of developing bowel obstruction symptoms   Consultants:  PCCM Cardiology General surgery Infectious disease  Procedures:  Transthoracic Echocardiogram (09/27/21)  Antimicrobials: Ceftriaxone Cefepime    Subjective: Patient reports feeling better than yesterday. No bowel movement today but had a very tiny BM yesterday. Passing some gas. No nausea or vomiting.  Objective: BP 110/87 (BP Location: Left Arm)   Pulse (!) 110   Temp 98.5 F (36.9 C)   Resp (!) 22   Ht 5\' 1"  (1.549 m)   Wt 50.9 kg   SpO2 96%   BMI 21.20 kg/m   Examination:  General exam: Appears calm and comfortable Respiratory system: Clear to auscultation. Respiratory effort normal. Cardiovascular system: S1 & S2 heard, RRR. Gastrointestinal system: Abdomen is distended, soft and nontender. Bowel sounds heard. Central nervous system: Alert and oriented. No focal neurological deficits. Musculoskeletal: No edema. No calf tenderness Skin: No cyanosis. No rashes Psychiatry: Judgement and insight appear normal. Mood & affect appropriate.    Data Reviewed: I have personally reviewed following labs and imaging studies  CBC Lab Results  Component Value Date   WBC 25.5 (H) 11/06/2021   RBC 3.27 (L) 11/06/2021   HGB 10.4 (L) 11/06/2021    HCT 32.0 (L) 11/06/2021   MCV 97.9 11/06/2021   MCH 31.8 11/06/2021   PLT 341 11/06/2021   MCHC 32.5 11/06/2021   RDW 15.6 (H) 11/06/2021   LYMPHSABS 2.1 10/30/2021   MONOABS 1.5 (H) 10/30/2021   EOSABS 0.0 10/30/2021   BASOSABS 0.1 [id number]     Last metabolic panel Lab Results  Component Value Date   NA 131 (L) 11/06/2021   K PENDING 11/06/2021   CL 90 (L) 11/06/2021   CO2 32 11/06/2021   BUN 9 11/06/2021   CREATININE 0.37 (L) 11/06/2021   GLUCOSE 120 (H) 11/06/2021   GFRNONAA >60 11/06/2021   GFRAA >60 05/14/2019   CALCIUM 9.0 11/06/2021   PHOS 4.0 10/25/2021   PROT 6.3 (L) 10/27/2021   ALBUMIN 1.9 (L) 10/27/2021   BILITOT 0.4 10/27/2021   ALKPHOS 200 (H) 10/27/2021   AST 38 10/27/2021   ALT 86 (H) 10/27/2021   ANIONGAP 9 11/06/2021    GFR: Estimated Creatinine Clearance: 54.3 mL/min (A) (by C-G formula based on SCr of 0.37 mg/dL (L)).  Recent Results (from the past 240 hour(s))  Culture, blood (Routine X 2) w Reflex to ID Panel     Status: None   Collection Time: 10/30/21  1:56 PM   Specimen: BLOOD  Result Value Ref Range Status   Specimen Description BLOOD RIGHT ANTECUBITAL  Final   Special Requests   Final    BOTTLES DRAWN AEROBIC AND ANAEROBIC Blood Culture results may not be optimal due to an inadequate volume of blood received in culture bottles   Culture   Final    NO GROWTH 5 DAYS Performed at [facility], [place] [address] [place], [place] [address]    Report Status 11/04/2021 FINAL  Final  Culture, blood (Routine X 2) w Reflex to ID Panel     Status: None   Collection Time: 10/30/21  1:59 PM   Specimen: BLOOD  Result Value Ref Range Status   Specimen Description BLOOD RIGHT ANTECUBITAL  Final   Special Requests   Final    BOTTLES DRAWN AEROBIC AND ANAEROBIC Blood Culture adequate volume   Culture   Final    NO GROWTH 5 DAYS Performed at [facility], [address], [place], [place] [address]    Report Status 11/04/2021 FINAL   Final      Radiology Studies: DG Abd Portable 1V  Result Date: 11/06/2021 CLINICAL DATA:  Partial small bowel obstruction. EXAM: PORTABLE ABDOMEN - 1 VIEW COMPARISON:  11/05/2021; 11/01/2021; 10/31/2021; 10/30/2021; CT abdomen pelvis-10/30/2021 FINDINGS: Redemonstrated moderate gaseous distension multiple loops of small bowel with index loop of small bowel within the right mid abdomen measuring 4.1 cm in diameter. A small amount of air is seen within the colon however there is otherwise a paucity of colonic gas. No supine evidence of pneumoperitoneum. No pneumatosis or portal venous gas. Limited visualization of the lower thorax is normal Punctate calcification overlying the right upper abdominal quadrant compatible with partially calcified porta hepatis lymph node demonstrated on previous abdominal CT. Mild-to-moderate multilevel lumbar spine DDD is suspected though incompletely evaluated. Post lower lumbar intervertebral disc space replacement, incompletely evaluated. IMPRESSION: Similar findings suggestive of partial small bowel obstruction. Electronically Signed   By: Sandi Mariscal M.D.   On: 11/06/2021 08:09   DG Abd Portable 1V  Result Date: 11/05/2021 CLINICAL DATA:  Follow-up for partial small bowel obstruction. EXAM: PORTABLE ABDOMEN - 1 VIEW COMPARISON:  11/01/2021, 10/31/2021 and CT dated 10/30/2021. FINDINGS: Small-bowel dilation has increased from the most recent prior study, similar to its appearance on 10/31/2021. Relatively opaque stool noted throughout the colon and in the rectum, similar to the prior exams. IMPRESSION: 1. Small-bowel dilation developing since the most recent prior study, but similar to the exam from 10/31/2021, suggesting a recurrent partial small bowel obstruction. Electronically Signed   By: Lajean Manes M.D.   On: 11/05/2021 11:57      LOS: 42 days    Cordelia Poche, MD [facility] 11/06/2021, 11:19 AM   If 7PM-7AM, please contact  night-coverage [email or web address]

## 2021-11-06 NOTE — Progress Notes (Signed)
Physical Therapy Treatment Patient Details Name: [name] MRN: [id number] DOB: July 27, 1955 Today's Date: 11/06/2021   History of Present Illness 66 y/o F reports diagnosis of PNA on 4/16 Admitted 4/17 to [facility], ICU with acute hypoxic/hypercarbic respiratory failure, failed BiPAP and required mechanical ventilation, transferred to [facility] 4/21 due to new finding of LV dysfunction Echo 4/20 with EF 40% , akinesis of apex and septal wall. . Failed weaning attempt 4/28. Trach placed on 5/1. PMH significant for COPD, tobacco use, HLD, GRED, and uncontrolled anxiety.    PT Comments    Patient received in bed, she is agreeable to get up to recliner. Family at bedside. She continues to require mod/max A with bed mobility. Max A for stand pivot transfer to recliner. Max assist to position in recliner. Patient will continue to benefit from skilled PT to maximize strength and functional independence.       Recommendations for follow up therapy are one component of a multi-disciplinary discharge planning process, led by the attending physician.  Recommendations may be updated based on patient status, additional functional criteria and insurance authorization.  Follow Up Recommendations  Acute inpatient rehab (3hours/day)     Assistance Recommended at Discharge Frequent or constant Supervision/Assistance  Patient can return home with the following A lot of help with walking and/or transfers;A lot of help with bathing/dressing/bathroom;Help with stairs or ramp for entrance;Assist for transportation;Assistance with cooking/housework   Equipment Recommendations  BSC/3in1;Wheelchair (measurements PT);Wheelchair cushion (measurements PT);Other (comment);Hospital bed    Recommendations for Other Services       Precautions / Restrictions Precautions Precautions: Fall Precaution Comments: Lurline Idol, to get NG tube today for possible new obstruction Restrictions Weight Bearing Restrictions: No      Mobility  Bed Mobility Overal bed mobility: Needs Assistance Bed Mobility: Supine to Sit, Sidelying to Sit   Sidelying to sit: Max assist Supine to sit: Mod assist, HOB elevated     General bed mobility comments: Max assist to bring legs to edge of bed, mod A to raise trunk to seated position. Able to sit unsupported once positioned.    Transfers Overall transfer level: Needs assistance Equipment used: None Transfers: Bed to chair/wheelchair/BSC Sit to Stand: Max assist Stand pivot transfers: Max assist              Ambulation/Gait               General Gait Details: unable   Stairs             Wheelchair Mobility    Modified Rankin (Stroke Patients Only)       Balance Overall balance assessment: Needs assistance Sitting-balance support: Feet supported Sitting balance-Leahy Scale: Good Sitting balance - Comments: able to sit unsupported with feet on floor and UE support.     Standing balance-Leahy Scale: Zero                              Cognition Arousal/Alertness: Awake/alert Behavior During Therapy: WFL for tasks assessed/performed Overall Cognitive Status: Within Functional Limits for tasks assessed                                          Exercises      General Comments        Pertinent Vitals/Pain Pain Assessment Pain Assessment: No/denies pain Pain Intervention(s): Monitored  during session, Repositioned    Home Living                          Prior Function            PT Goals (current goals can now be found in the care plan section) Acute Rehab PT Goals Patient Stated Goal: Did not state, but agrees to getting OOB PT Goal Formulation: With patient/family Time For Goal Achievement: 11/20/21 Progress towards PT goals: Progressing toward goals    Frequency    Min 3X/week      PT Plan Current plan remains appropriate    Co-evaluation              AM-PAC PT "6  Clicks" Mobility   Outcome Measure  Help needed turning from your back to your side while in a flat bed without using bedrails?: A Lot Help needed moving from lying on your back to sitting on the side of a flat bed without using bedrails?: A Lot Help needed moving to and from a bed to a chair (including a wheelchair)?: Total Help needed standing up from a chair using your arms (e.g., wheelchair or bedside chair)?: Total Help needed to walk in hospital room?: Total Help needed climbing 3-5 steps with a railing? : Total 6 Click Score: 8    End of Session Equipment Utilized During Treatment: Gait belt;Oxygen Activity Tolerance: Patient tolerated treatment well Patient left: in chair;with call bell/phone within reach;with chair alarm set;with family/visitor present Nurse Communication: Mobility status PT Visit Diagnosis: Other abnormalities of gait and mobility (R26.89);Muscle weakness (generalized) (M62.81)     Time: [id number] PT Time Calculation (min) (ACUTE ONLY): 25 min  Charges:  $Therapeutic Activity: 23-37 mins                     Londynn Sonoda, PT, GCS 11/06/21,12:32 PM

## 2021-11-06 NOTE — Progress Notes (Signed)
Progress Note     Subjective: Called back by primary service due to concern decreased bowel function after initial improvement with dilated small bowel noted on abdominal film. Patient decreased back to CLD which she is tolerating, but complains of some persistent intermittent abdominal pain and nausea. States that she is passing some flatus today. Tiny BM with suppository yesterday.  Objective: Vital signs in last 24 hours: Temp:  [98.1 F (36.7 C)-98.9 F (37.2 C)] 98.5 F (36.9 C) (05/30 0924) Pulse Rate:  [99-118] 110 (05/30 0932) Resp:  [17-26] 22 (05/30 0928) BP: (110-126)/(66-87) 110/87 (05/30 0924) SpO2:  [88 %-96 %] 96 % (05/30 0941) FiO2 (%):  [28 %-35 %] 35 % (05/30 0941) Weight:  [50.9 kg] 50.9 kg (05/30 0452) Last BM Date : 11/03/26  Intake/Output from previous day: 05/29 0701 - 05/30 0700 In: 800 [P.O.:800] Out: 1750 [Urine:1750] Intake/Output this shift: No intake/output data recorded.  PE: General: pleasant, WD, chronically ill appearing female who is laying in bed in NAD Heart: regular, rate, and rhythm.   Lungs:  Respiratory effort nonlabored, trach present with PMV Abd: soft, mild distension, mild right sided TTP without rebound or guarding Psych: A&Ox3 with an appropriate affect.    Lab Results:  Recent Labs    11/05/21 0157 11/06/21 0753  WBC 23.1* 25.5*  HGB 9.9* 10.4*  HCT 30.4* 32.0*  PLT 328 341    BMET Recent Labs    11/05/21 0157 11/06/21 0753  NA 133* 131*  K 2.8* PENDING  CL 92* 90*  CO2 35* 32  GLUCOSE 125* 120*  BUN 5* 9  CREATININE 0.35* 0.37*  CALCIUM 8.6* 9.0    PT/INR No results for input(s): LABPROT, INR in the last 72 hours.  CMP     Component Value Date/Time   NA 131 (L) 11/06/2021 0753   K PENDING 11/06/2021 0753   CL 90 (L) 11/06/2021 0753   CO2 32 11/06/2021 0753   GLUCOSE 120 (H) 11/06/2021 0753   BUN 9 11/06/2021 0753   CREATININE 0.37 (L) 11/06/2021 0753   CALCIUM 9.0 11/06/2021 0753   PROT 6.3  (L) 10/27/2021 0432   ALBUMIN 1.9 (L) 10/27/2021 0432   AST 38 10/27/2021 0432   ALT 86 (H) 10/27/2021 0432   ALKPHOS 200 (H) 10/27/2021 0432   BILITOT 0.4 10/27/2021 0432   GFRNONAA >60 11/06/2021 0753   GFRAA >60 05/14/2019 1651   Lipase     Component Value Date/Time   LIPASE 23 05/14/2019 1651       Studies/Results: DG Abd Portable 1V  Result Date: 11/06/2021 CLINICAL DATA:  Partial small bowel obstruction. EXAM: PORTABLE ABDOMEN - 1 VIEW COMPARISON:  11/05/2021; 11/01/2021; 10/31/2021; 10/30/2021; CT abdomen pelvis-10/30/2021 FINDINGS: Redemonstrated moderate gaseous distension multiple loops of small bowel with index loop of small bowel within the right mid abdomen measuring 4.1 cm in diameter. A small amount of air is seen within the colon however there is otherwise a paucity of colonic gas. No supine evidence of pneumoperitoneum. No pneumatosis or portal venous gas. Limited visualization of the lower thorax is normal Punctate calcification overlying the right upper abdominal quadrant compatible with partially calcified porta hepatis lymph node demonstrated on previous abdominal CT. Mild-to-moderate multilevel lumbar spine DDD is suspected though incompletely evaluated. Post lower lumbar intervertebral disc space replacement, incompletely evaluated. IMPRESSION: Similar findings suggestive of partial small bowel obstruction. Electronically Signed   By: Sandi Mariscal M.D.   On: 11/06/2021 08:09   DG Abd Portable 1V  Result Date: 11/05/2021 CLINICAL DATA:  Follow-up for partial small bowel obstruction. EXAM: PORTABLE ABDOMEN - 1 VIEW COMPARISON:  11/01/2021, 10/31/2021 and CT dated 10/30/2021. FINDINGS: Small-bowel dilation has increased from the most recent prior study, similar to its appearance on 10/31/2021. Relatively opaque stool noted throughout the colon and in the rectum, similar to the prior exams. IMPRESSION: 1. Small-bowel dilation developing since the most recent prior study,  but similar to the exam from 10/31/2021, suggesting a recurrent partial small bowel obstruction. Electronically Signed   By: Lajean Manes M.D.   On: 11/05/2021 11:57    Anti-infectives: Anti-infectives (From admission, onward)    Start     Dose/Rate Route Frequency Ordered Stop   11/02/21 0000  ceFAZolin (ANCEF) IVPB 2g/100 mL premix        2 g 200 mL/hr over 30 Minutes Intravenous To Radiology 11/01/21 0957 11/03/21 0000   10/25/21 0400  vancomycin (VANCOCIN) IVPB 1000 mg/200 mL premix  Status:  Discontinued        1,000 mg 200 mL/hr over 60 Minutes Intravenous Every 24 hours 10/24/21 0411 10/25/21 1513   10/24/21 1600  ceFEPIme (MAXIPIME) 2 g in sodium chloride 0.9 % 100 mL IVPB  Status:  Discontinued        2 g 200 mL/hr over 30 Minutes Intravenous Every 12 hours 10/24/21 0411 10/29/21 1457   10/24/21 0430  ceFEPIme (MAXIPIME) 2 g in sodium chloride 0.9 % 100 mL IVPB        2 g 200 mL/hr over 30 Minutes Intravenous  Once 10/24/21 0343 10/24/21 0719   10/24/21 0400  vancomycin (VANCOREADY) IVPB 1250 mg/250 mL        1,250 mg 166.7 mL/hr over 90 Minutes Intravenous  Once 10/24/21 0343 10/24/21 0603   10/06/21 1330  cefTRIAXone (ROCEPHIN) 2 g in sodium chloride 0.9 % 100 mL IVPB        2 g 200 mL/hr over 30 Minutes Intravenous Every 24 hours 10/06/21 1242 10/08/21 1529   10/04/21 0900  ceFEPIme (MAXIPIME) 2 g in sodium chloride 0.9 % 100 mL IVPB  Status:  Discontinued        2 g 200 mL/hr over 30 Minutes Intravenous Every 12 hours 10/04/21 0816 10/06/21 1242   10/03/21 0900  cefTRIAXone (ROCEPHIN) 1 g in sodium chloride 0.9 % 100 mL IVPB  Status:  Discontinued        1 g 200 mL/hr over 30 Minutes Intravenous Every 24 hours 10/03/21 0819 10/04/21 0816   09/25/21 2030  azithromycin (ZITHROMAX) tablet 500 mg       See Hyperspace for full Linked Orders Report.   500 mg Oral Daily 09/24/21 2020 09/28/21 0829   09/24/21 2020  azithromycin (ZITHROMAX) 500 mg in sodium chloride 0.9 % 250 mL  IVPB       See Hyperspace for full Linked Orders Report.   500 mg 250 mL/hr over 60 Minutes Intravenous Every 24 hours 09/24/21 2020 09/24/21 2225        Assessment/Plan Partial SBO vs ileus - hx Cystectomy and ileal neo-bladder 2006, transabdominal and transvaginal fistula repair 2006, hysterectomy - CT 5/23 shows dilated small bowel loops in the pelvis near surgical anastomotic site concerning for partial obstruction; small amount of free fluid is noted in the pelvis of uncertain significance; mild gastric distension; stool and contrast noted throughout the nondilated colon - Patient was seen last week and initially improved with suppositories and increased bowel regimen, symptoms were thought to be more related  to ileus. General surgery signed off 5/26.  - Re-consulted due to decreased bowel function and KUB today with moderate gaseous distention of multiple small bowel loops and small amount of air in the colon - abdominal exam today with mild distension and tenderness - patient did not undergo SBO protocol previously since she was already improving, would benefit from NGT placement and SBO protocol to r/o SBO - recommend mobilizing as able and keeping Mg >2.0 and K>4.0 to optimize bowel function  - no indication for acute surgical intervention    ID - none currently; UCx with >100K colonies of gram negative rods, suspect this is the source of her leukocytosis of 25K, tx of UTI per primary team  VTE - lovenox FEN - NPO, IVF per primary team  Foley - none   COPD exacerbation Acute respiratory failure with hypoxia and hypercapnia Tracheostomy/vent dependent respiratory failure Pulmonary nodules Hospital acquired PNA - completed abx Acute systolic heart failure Severe malnutrition HLD  LOS: 42 days     Margie Billet, [facility] Surgery 11/06/2021, 11:31 AM Please see Amion for pager number during day hours 7:00am-4:30pm

## 2021-11-06 NOTE — Progress Notes (Signed)
Nutrition Follow-up  DOCUMENTATION CODES:   Severe malnutrition in context of chronic illness  INTERVENTION:   Recommend considering TPN initiation at this time  Continue to recommend PEG placement once SBO resolves  NUTRITION DIAGNOSIS:   Severe Malnutrition related to chronic illness (COPD) as evidenced by severe muscle depletion, severe fat depletion.  Continues  GOAL:   Patient will meet greater than or equal to 90% of their needs  Not Met  MONITOR:   PO intake, Labs, Weight trends, Skin, I & O's, TF tolerance  REASON FOR ASSESSMENT:   Ventilator    ASSESSMENT:   Pt admitted with difficulty breathing d/t acute respiratory failure with hypoxia and hypercapnia. Pt with diagnosis of PNA 4/16. PMH significant for COPD, uncontrolled anxiety.  5/23 TF held, Cortrak removed, CT with dilated SB loops, NPO 5/25 FL diet 5/26 Soft diet, Surgery signed off 5/29 Abd xray worsening,  5/30 NPO, Abd xray with gaeous distention of SB loops, small amount of air in colon  NG tube inserted today, pt now NPO. Noted plan to administer gastrografin and repeat abd xray  Limited documentation of po intake; appears pt was eating 25-50% of meal trays on 5/26 to 5/28. RD stared calorie count yesterday but d/c today due to change back to NPO status  Labs: sodium 131 (wdl) Meds: dulcolax suppository, colace, miralax,   Diet Order:   Diet Order             Diet NPO time specified  Diet effective now                   EDUCATION NEEDS:   No education needs have been identified at this time  Skin:  Skin Assessment: Skin Integrity Issues: Skin Integrity Issues:: DTI DTI: buttocks  Last BM:  5/20  Height:   Ht Readings from Last 1 Encounters:  10/27/21 _0  (1.549 m)    Weight:   Wt Readings from Last 1 Encounters:  11/06/21 50.9 kg     BMI:  Body mass index is 21.2 kg/m.  Estimated Nutritional Needs:   Kcal:  1700-1900 kcals  Protein:  90-110 g  Fluid:   >/= 1.7 L   Kerman Passey MS, RDN, LDN, CNSC Registered Dietitian III Clinical Nutrition RD Pager and On-Call Pager Number Located in [place]

## 2021-11-06 NOTE — Progress Notes (Signed)
Inpatient Rehab Admissions Coordinator:    I do not have a CIR bed or insurance auth for this pt. I will continue to follow for potential admit pending insurance auth.   Shakeya Kerkman, MS, CCC-SLP Rehab Admissions Coordinator  [phone] (celll) [phone] (office) 

## 2021-11-06 NOTE — Progress Notes (Signed)
   NAME:  [name], MRN:  [id number], DOB:  [birth year], LOS: 42 ADMISSION DATE:  09/24/2021, CONSULTATION DATE:  4/18 REFERRING MD:  Tat/ triad, CHIEF COMPLAINT:  resp distress    History of Present Illness:  66 y.o. female quit smoking 2021 with  GOLD 3 COPD MZ, uncontrolled anxiety. Was on 02 one year PTA but "took herself off" per friend at bedside and able to care for pets at home but mostly housebound sinc ethen  She was admitted 4/17 to [facility], ICU with acute hypoxic/hypercarbic respiratory failure, failed BiPAP and required mechanical ventilation, transferred to [facility] 4/21 due to new finding of LV dysfunction.  Significant Hospital Events: Including procedures, antibiotic start and stop dates in addition to other pertinent events   ET  4/18 c/b hypotension with severe air trapping  Echo 4/20 with EF 40% , akinesis of apex and septal wall 4/24 CTA head negative for any significant abnormality 4/25 ketamine added 4/28 increased work of breathing during the wean, severe auto PEEP 4/29 Febrile 101.2 5/1 perc trach placement 5/15 trach changed to Shiley #6  cuffless 5/16 antibiotics resumed for inreasing resp distress, leukocytosis, increased LA  Interim History / Subjective:  Still with significant oxygen requirement Saturating at 92%, heart rate of 108  Objective   Blood pressure 110/87, pulse (!) 110, temperature 98.5 F (36.9 C), resp. rate (!) 22, height 5\' 1"  (1.549 m), weight 50.9 kg, SpO2 96 %.    FiO2 (%):  [28 %-35 %] 35 %   Intake/Output Summary (Last 24 hours) at 11/06/2021 1136 Last data filed at 11/06/2021 0452 Gross per 24 hour  Intake 650 ml  Output 1000 ml  Net -350 ml   Filed Weights   11/04/21 0500 11/05/21 0500 11/06/21 0452  Weight: 53.6 kg 50.8 kg 50.9 kg   Examination: General: Chronically ill-appearing  HEENT: Moist oral mucosa Neck: Tracheostomy in place with Passy-Muir valve in place Cardio: S1-S2 appreciated with no murmur Resp:  Decreased air movement bilaterally  neuro: arouses easily from sleep, nodding to answer questions Psych: calm, coopreative  5/17 blood cultures> NGTD CXR personally reviewed> 5/21 with lower lobe infiltrates CT 5/21 personally reviewedgreater than right lobes--- all new since 11/2020 lung cancer screening CT.   Assessment & Plan:   Abdominal distention Partial small bowel obstruction versus ileus -General surgery following -Still passing gas  Leukocytosis Pulmonary nodules -Infectious disease input noted -Significance of lung nodule is unclear at present -Cryptococcal antigen, histoplasma antigen, Blastomyces antigen ordered by infectious disease  .  Acute hypoxemic/hypercapnic respiratory failure .  Tracheostomy with vent dependent respiratory failure .  Klebsiella pneumonia-resolved .  Heart failure with mildly reduced ejection fraction .  Acute metabolic encephalopathy .  Severe anxiety .  Severe protein calorie malnutrition .  Dysphagia  .  Upper lobe nodules-presumed infectious  -Continue trach per protocol -Still has significant secretions -Continue current trach  -Hypertonic saline nebulization  -Continue with 12/2020 and Yupelri  -Tracheal aspirate for cultures if new fever  -Plan for CT scan of the chest in 6 to 8 weeks to follow-up for resolution of upper lobe nodules  -Follow-up weekly  Rosalyn Gess, MD [facility] Pager: See Virl Diamond, DO 11/06/21 11:36 AM [facility]

## 2021-11-07 ENCOUNTER — Inpatient Hospital Stay (HOSPITAL_COMMUNITY): Payer: Medicare Other

## 2021-11-07 DIAGNOSIS — I5022 Chronic systolic (congestive) heart failure: Secondary | ICD-10-CM | POA: Diagnosis not present

## 2021-11-07 DIAGNOSIS — J9601 Acute respiratory failure with hypoxia: Secondary | ICD-10-CM | POA: Diagnosis not present

## 2021-11-07 DIAGNOSIS — J9602 Acute respiratory failure with hypercapnia: Secondary | ICD-10-CM | POA: Diagnosis not present

## 2021-11-07 DIAGNOSIS — G9340 Encephalopathy, unspecified: Secondary | ICD-10-CM | POA: Diagnosis not present

## 2021-11-07 DIAGNOSIS — J441 Chronic obstructive pulmonary disease with (acute) exacerbation: Secondary | ICD-10-CM | POA: Diagnosis not present

## 2021-11-07 DIAGNOSIS — F419 Anxiety disorder, unspecified: Secondary | ICD-10-CM | POA: Diagnosis not present

## 2021-11-07 LAB — CBC
HCT: 28.6 % — ABNORMAL LOW (ref 36.0–46.0)
Hemoglobin: 9.3 g/dL — ABNORMAL LOW (ref 12.0–15.0)
MCH: 31.8 pg (ref 26.0–34.0)
MCHC: 32.5 g/dL (ref 30.0–36.0)
MCV: 97.9 fL (ref 80.0–100.0)
Platelets: 298 10*3/uL (ref 150–400)
RBC: 2.92 MIL/uL — ABNORMAL LOW (ref 3.87–5.11)
RDW: 15.5 % (ref 11.5–15.5)
WBC: 20.3 10*3/uL — ABNORMAL HIGH (ref 4.0–10.5)
nRBC: 0 % (ref 0.0–0.2)

## 2021-11-07 LAB — BASIC METABOLIC PANEL
Anion gap: 5 (ref 5–15)
BUN: 10 mg/dL (ref 8–23)
CO2: 32 mmol/L (ref 22–32)
Calcium: 8.4 mg/dL — ABNORMAL LOW (ref 8.9–10.3)
Chloride: 96 mmol/L — ABNORMAL LOW (ref 98–111)
Creatinine, Ser: <0.3 mg/dL — ABNORMAL LOW (ref 0.44–1.00)
Glucose, Bld: 94 mg/dL (ref 70–99)
Potassium: 4 mmol/L (ref 3.5–5.1)
Sodium: 133 mmol/L — ABNORMAL LOW (ref 135–145)

## 2021-11-07 LAB — URINE CULTURE: Culture: >=100000 — AB

## 2021-11-07 MED ORDER — PROSOURCE PLUS PO LIQD
30.0000 mL | Freq: Three times a day (TID) | ORAL | Status: DC
Start: 2021-11-07 — End: 2021-11-12
  Administered 2021-11-08 – 2021-11-12 (×11): 30 mL via ORAL
  Filled 2021-11-07 (×13): qty 30

## 2021-11-07 MED ORDER — ADULT MULTIVITAMIN W/MINERALS CH
1.0000 | ORAL_TABLET | Freq: Every day | ORAL | Status: DC
  Administered 2021-11-07 – 2021-11-14 (×8): 1 via ORAL
  Filled 2021-11-07 (×8): qty 1

## 2021-11-07 MED ORDER — BOOST / RESOURCE BREEZE PO LIQD CUSTOM
1.0000 | Freq: Three times a day (TID) | ORAL | Status: DC
  Administered 2021-11-08: 1 via ORAL

## 2021-11-07 NOTE — TOC Progression Note (Signed)
Transition of Care [facility]) - Progression Note    Patient Details  Name: ORTHA METTS MRN: [id number] Date of Birth: Oct 26, 1955  [facility]) CM/SW Contact  Janae Bridgeman, RN Phone Number: 11/07/2021, 7:47 AM  Clinical Narrative:    CM and LCSW with DTP Team continue to follow the patient for admission to CIR bed once insurance authorization has been obtained and bed available.   Expected Discharge Plan: IP Rehab Facility Barriers to Discharge: Continued Medical Work up  Expected Discharge Plan and Services Expected Discharge Plan: IP Rehab Facility In-house Referral: Clinical Social Work Discharge Planning Services: CM Consult Post Acute Care Choice: IP Rehab Living arrangements for the past 2 months: Single Family Home                                       Social Determinants of Health (SDOH) Interventions    Readmission Risk Interventions    10/26/2021    5:04 PM 10/26/2021    5:02 PM  Readmission Risk Prevention Plan  Medication Review (RN Care Manager)  Complete  PCP or Specialist appointment within 3-5 days of discharge Complete   Skilled Nursing Facility Complete

## 2021-11-07 NOTE — Progress Notes (Signed)
Physical Therapy Treatment Patient Details Name: [name] MRN: [id number] DOB: 06/24/1955 Today's Date: 11/07/2021   History of Present Illness 66 y/o F reports diagnosis of PNA on 4/16 Admitted 4/17 to [facility], ICU with acute hypoxic/hypercarbic respiratory failure, failed BiPAP and required mechanical ventilation, transferred to [facility] 4/21 due to new finding of LV dysfunction Echo 4/20 with EF 40% , akinesis of apex and septal wall. . Failed weaning attempt 4/28. Trach placed on 5/1. PMH significant for COPD, tobacco use, HLD, GRED, and uncontrolled anxiety.    PT Comments    Patient continues to make small gains each session. Patient required frequent rest breaks between standing bouts but able to stand x 3 with maxA and +2 for safety. Patient able to take 2-3 steps with R foot over to recliner with maxA+2. Continues to be limited by fatigue and weakness. Continue to recommend acute inpatient rehab (AIR) for post-acute therapy needs.     Recommendations for follow up therapy are one component of a multi-disciplinary discharge planning process, led by the attending physician.  Recommendations may be updated based on patient status, additional functional criteria and insurance authorization.  Follow Up Recommendations  Acute inpatient rehab (3hours/day)     Assistance Recommended at Discharge Frequent or constant Supervision/Assistance  Patient can return home with the following A lot of help with walking and/or transfers;A lot of help with bathing/dressing/bathroom;Help with stairs or ramp for entrance;Assist for transportation;Assistance with cooking/housework   Equipment Recommendations  BSC/3in1;Wheelchair (measurements PT);Wheelchair cushion (measurements PT);Other (comment);Hospital bed    Recommendations for Other Services       Precautions / Restrictions Precautions Precautions: Fall Precaution Comments: trach Restrictions Weight Bearing Restrictions: No      Mobility  Bed Mobility Overal bed mobility: Needs Assistance Bed Mobility: Rolling, Sidelying to Sit Rolling: Min assist Sidelying to sit: Max assist       General bed mobility comments: MinA to roll towards L side with assist for bringing R LE into flexed position. MaxA for bringing LEs off bed and trunk elevation this session    Transfers Overall transfer level: Needs assistance Equipment used: 2 person hand held assist Transfers: Sit to/from Stand, Bed to chair/wheelchair/BSC Sit to Stand: Max assist, +2 safety/equipment Stand pivot transfers: Max assist, +2 safety/equipment         General transfer comment: +2 for safety. MaxA to rise into standing x 3 from EOB. Provided opportunity for patient to advance R LE and able to take 2-3 steps towards chair. Required assistance for balance and L LE advancement    Ambulation/Gait                   Stairs             Wheelchair Mobility    Modified Rankin (Stroke Patients Only)       Balance Overall balance assessment: Needs assistance Sitting-balance support: Feet supported Sitting balance-Leahy Scale: Good     Standing balance support: Bilateral upper extremity supported, During functional activity Standing balance-Leahy Scale: Zero                              Cognition Arousal/Alertness: Awake/alert Behavior During Therapy: WFL for tasks assessed/performed Overall Cognitive Status: Within Functional Limits for tasks assessed  Exercises General Exercises - Lower Extremity Heel Slides: AAROM, Left, 5 reps, Seated    General Comments General comments (skin integrity, edema, etc.): VSS on 40% FiO2 on 10 L      Pertinent Vitals/Pain Pain Assessment Pain Assessment: No/denies pain    Home Living                          Prior Function            PT Goals (current goals can now be found in the care plan  section) Acute Rehab PT Goals PT Goal Formulation: With patient/family Time For Goal Achievement: 11/20/21 Potential to Achieve Goals: Fair Progress towards PT goals: Progressing toward goals    Frequency    Min 3X/week      PT Plan Current plan remains appropriate    Co-evaluation              AM-PAC PT "6 Clicks" Mobility   Outcome Measure  Help needed turning from your back to your side while in a flat bed without using bedrails?: A Little Help needed moving from lying on your back to sitting on the side of a flat bed without using bedrails?: A Lot Help needed moving to and from a bed to a chair (including a wheelchair)?: Total Help needed standing up from a chair using your arms (e.g., wheelchair or bedside chair)?: Total Help needed to walk in hospital room?: Total Help needed climbing 3-5 steps with a railing? : Total 6 Click Score: 9    End of Session Equipment Utilized During Treatment: Gait belt;Oxygen Activity Tolerance: Patient tolerated treatment well Patient left: in chair;with call bell/phone within reach;with chair alarm set Nurse Communication: Mobility status PT Visit Diagnosis: Other abnormalities of gait and mobility (R26.89);Muscle weakness (generalized) (M62.81)     Time: [id number] PT Time Calculation (min) (ACUTE ONLY): 33 min  Charges:  $Therapeutic Activity: 23-37 mins                     Kaisyn Reinhold A. Gilford Rile PT, DPT Acute Rehabilitation Services Pager [phone] Office [phone]    Linna Hoff 11/07/2021, 4:07 PM

## 2021-11-07 NOTE — Progress Notes (Signed)
  Progress Note   Patient: [name] [id number] DOB: March 28, 1956 DOA: 09/24/2021     43 DOS: the patient was seen and examined on 11/07/2021        Brief hospital course:  66 y.o. female with medical history significant for COPD, uncontrolled anxiety. Patient presented to the ED with complaints of difficulty breathing, cough, wheezing of about 1 week duration.  She also reports wheezing, and fatigue.  She reports O2 sats down to 78% on room air. She reports she was diagnosed with pneumonia yesterday, given a shot of antibiotic continue on antibiotics and steroids. Despite use of inhalers, breathing continues to worsen.  No leg swelling no chest pain.  Patient has a history of anxiety and panic attacks. EMS reports patient had rhonchi, O2 sats in the 80s on arrival to the ED.   In the ED, patient was placed BiPAP. She was subsequently weaned to to [facility], but in the afternoon 4/19, she began having sob again with tachypnea for which she was placed back on BiPAP. ABG showed hypercarbic respiratory failure 7.09/105/232/31 on biPAP, so she was intubated.  PCCM was consulted to assist.     Assessment and Plan: * Acute respiratory failure with hypoxia and hypercapnia (HCC) COPD exacerbation (HCC) Seems to be resolved - Continue LAMA, LABA, ICS - Continue SABA - Continue clonazepam - Continue Singulair  SBO Resolving - Advance diet to clears today  Anxiety - Continue Seroquel  Hyperlipidemia - Continue Crestor  Essential hypertension - Continue metoprolol            Subjective: Feels well, no dysuria, urinary frequency, urinary urgency, suprapubic pain, fever.     Physical Exam: Vitals:   11/07/21 1755 11/07/21 1815 11/07/21 1910 11/07/21 1912  BP: 125/70     Pulse: (!) 116 (!) 103  (!) 112  Resp: 19 18  (!) 22  Temp: 99.1 F (37.3 C)     TempSrc: Oral     SpO2: (!) 86% 92% 94% 95%  Weight:      Height:       Thin elderly female, lying in bed, no  acute distress. Respiratory effort normal, lungs clear without rales or wheezes Tracheostomy in place RRR, no murmurs, no peripheral edema Abdomen soft no tenderness palpation or guarding, no ascites or distention  Data Reviewed: Urine culture reviewed, shows Klebsiella Sodium 133, creatinine normal Hemogram reviewed, shows mild anemia, improving leukocytosis       Disposition: Status is: Inpatient         Author: Alberteen Sam, MD 11/07/2021 7:16 PM  For on call review [email or web address].

## 2021-11-07 NOTE — Progress Notes (Signed)
Inpatient Rehab Admissions Coordinator:     I have insurance auth but pt. Is not medically ready for CIR, as she is being worked up for SBO/ileus and back on clear liquid diet. I will continue to follow for potential admit once medically ready.   Clemens Catholic, [place], [place] Admissions Coordinator  [phone] ([facility]) [phone] (office)

## 2021-11-07 NOTE — Care Management Important Message (Signed)
Important Message  Patient Details  Name: [name] MRN: [id number] Date of Birth: 17-Jan-1956   Medicare Important Message Given:  Yes     Orbie Pyo 11/07/2021, 2:23 PM

## 2021-11-07 NOTE — Progress Notes (Signed)
Patient ID: [name], female   DOB: [birth year], 66 y.o.   MRN: [id number] [facility] Surgery Progress Note     Subjective: CC-  Feeling better than yesterday. States that she has less abdominal pain. Denies n/v. Passing flatus. She had 1 large BM over night and a couple small ones. Xray shows contrast in the gastric lumen  Objective: Vital signs in last 24 hours: Temp:  [98.2 F (36.8 C)-98.8 F (37.1 C)] 98.2 F (36.8 C) (05/31 0545) Pulse Rate:  [95-112] 98 (05/31 0804) Resp:  [16-22] 18 (05/31 0804) BP: (110-127)/(62-87) 114/67 (05/31 0545) SpO2:  [90 %-97 %] 92 % (05/31 0804) FiO2 (%):  [35 %-40 %] 40 % (05/31 0804) Weight:  [49 kg] 49 kg (05/31 0500) Last BM Date : 11/06/21  Intake/Output from previous day: 05/30 0701 - 05/31 0700 In: 500.5 [P.O.:460; IV Piggyback:40.5] Out: 1600 [Urine:1600] Intake/Output this shift: No intake/output data recorded.  PE: General: chronically ill appearing female who is laying in bed in NAD Heart: regular, rate, and rhythm.   Lungs:  Respiratory effort nonlabored, trach present with PMV Abd: soft, mild distension, nontender Psych: A&Ox3 with an appropriate affect  Lab Results:  Recent Labs    11/06/21 0753 11/07/21 0441  WBC 25.5* 20.3*  HGB 10.4* 9.3*  HCT 32.0* 28.6*  PLT 341 298   BMET Recent Labs    11/06/21 0753 11/07/21 0441  NA 131* 133*  K 3.7 4.0  CL 90* 96*  CO2 32 32  GLUCOSE 120* 94  BUN 9 10  CREATININE 0.37* <0.30*  CALCIUM 9.0 8.4*   PT/INR No results for input(s): LABPROT, INR in the last 72 hours. CMP     Component Value Date/Time   NA 133 (L) 11/07/2021 0441   K 4.0 11/07/2021 0441   CL 96 (L) 11/07/2021 0441   CO2 32 11/07/2021 0441   GLUCOSE 94 11/07/2021 0441   BUN 10 11/07/2021 0441   CREATININE <0.30 (L) 11/07/2021 0441   CALCIUM 8.4 (L) 11/07/2021 0441   PROT 6.3 (L) 10/27/2021 0432   ALBUMIN 1.9 (L) 10/27/2021 0432   AST 38 10/27/2021 0432   ALT 86 (H) 10/27/2021 0432    ALKPHOS 200 (H) 10/27/2021 0432   BILITOT 0.4 10/27/2021 0432   GFRNONAA NOT CALCULATED 11/07/2021 0441   GFRAA >60 05/14/2019 1651   Lipase     Component Value Date/Time   LIPASE 23 05/14/2019 1651       Studies/Results: DG Abd Portable 1V  Result Date: 11/06/2021 CLINICAL DATA:  Partial small bowel obstruction. EXAM: PORTABLE ABDOMEN - 1 VIEW COMPARISON:  11/05/2021; 11/01/2021; 10/31/2021; 10/30/2021; CT abdomen pelvis-10/30/2021 FINDINGS: Redemonstrated moderate gaseous distension multiple loops of small bowel with index loop of small bowel within the right mid abdomen measuring 4.1 cm in diameter. A small amount of air is seen within the colon however there is otherwise a paucity of colonic gas. No supine evidence of pneumoperitoneum. No pneumatosis or portal venous gas. Limited visualization of the lower thorax is normal Punctate calcification overlying the right upper abdominal quadrant compatible with partially calcified porta hepatis lymph node demonstrated on previous abdominal CT. Mild-to-moderate multilevel lumbar spine DDD is suspected though incompletely evaluated. Post lower lumbar intervertebral disc space replacement, incompletely evaluated. IMPRESSION: Similar findings suggestive of partial small bowel obstruction. Electronically Signed   By: Sandi Mariscal M.D.   On: 11/06/2021 08:09   DG Abd Portable 1V  Result Date: 11/05/2021 CLINICAL DATA:  Follow-up for partial small bowel  obstruction. EXAM: PORTABLE ABDOMEN - 1 VIEW COMPARISON:  11/01/2021, 10/31/2021 and CT dated 10/30/2021. FINDINGS: Small-bowel dilation has increased from the most recent prior study, similar to its appearance on 10/31/2021. Relatively opaque stool noted throughout the colon and in the rectum, similar to the prior exams. IMPRESSION: 1. Small-bowel dilation developing since the most recent prior study, but similar to the exam from 10/31/2021, suggesting a recurrent partial small bowel obstruction.  Electronically Signed   By: Lajean Manes M.D.   On: 11/05/2021 11:57   DG Abd Portable 1V-Small Bowel Obstruction Protocol-initial, 8 hr delay  Result Date: 11/06/2021 CLINICAL DATA:  Small bowel follow through. Images obtained @ 2215. EXAM: PORTABLE ABDOMEN - 1 VIEW COMPARISON:  X-ray abdomen 530 20:38 a.m., x-ray abdomen 11/05/2021, CT abdomen pelvis 10/30/2021 FINDINGS: PO contrast overlies the gastric lumen. Persistent gaseous dilatation of the small bowel. No radio-opaque calculi or other significant radiographic abnormality are seen. Metallic densities overlying the lower abdomen consistent with vertebral spine surgical hardware. IMPRESSION: 1. PO contrast remains within the gastric lumen. 2. Persistent gaseous dilatation of the small bowel. Electronically Signed   By: Iven Finn M.D.   On: 11/06/2021 22:49    Anti-infectives: Anti-infectives (From admission, onward)    Start     Dose/Rate Route Frequency Ordered Stop   11/02/21 0000  ceFAZolin (ANCEF) IVPB 2g/100 mL premix        2 g 200 mL/hr over 30 Minutes Intravenous To Radiology 11/01/21 0957 11/03/21 0000   10/25/21 0400  vancomycin (VANCOCIN) IVPB 1000 mg/200 mL premix  Status:  Discontinued        1,000 mg 200 mL/hr over 60 Minutes Intravenous Every 24 hours 10/24/21 0411 10/25/21 1513   10/24/21 1600  ceFEPIme (MAXIPIME) 2 g in sodium chloride 0.9 % 100 mL IVPB  Status:  Discontinued        2 g 200 mL/hr over 30 Minutes Intravenous Every 12 hours 10/24/21 0411 10/29/21 1457   10/24/21 0430  ceFEPIme (MAXIPIME) 2 g in sodium chloride 0.9 % 100 mL IVPB        2 g 200 mL/hr over 30 Minutes Intravenous  Once 10/24/21 0343 10/24/21 0719   10/24/21 0400  vancomycin (VANCOREADY) IVPB 1250 mg/250 mL        1,250 mg 166.7 mL/hr over 90 Minutes Intravenous  Once 10/24/21 0343 10/24/21 0603   10/06/21 1330  cefTRIAXone (ROCEPHIN) 2 g in sodium chloride 0.9 % 100 mL IVPB        2 g 200 mL/hr over 30 Minutes Intravenous Every 24  hours 10/06/21 1242 10/08/21 1529   10/04/21 0900  ceFEPIme (MAXIPIME) 2 g in sodium chloride 0.9 % 100 mL IVPB  Status:  Discontinued        2 g 200 mL/hr over 30 Minutes Intravenous Every 12 hours 10/04/21 0816 10/06/21 1242   10/03/21 0900  cefTRIAXone (ROCEPHIN) 1 g in sodium chloride 0.9 % 100 mL IVPB  Status:  Discontinued        1 g 200 mL/hr over 30 Minutes Intravenous Every 24 hours 10/03/21 0819 10/04/21 0816   09/25/21 2030  azithromycin (ZITHROMAX) tablet 500 mg       See Hyperspace for full Linked Orders Report.   500 mg Oral Daily 09/24/21 2020 09/28/21 0829   09/24/21 2020  azithromycin (ZITHROMAX) 500 mg in sodium chloride 0.9 % 250 mL IVPB       See Hyperspace for full Linked Orders Report.   500 mg 250  mL/hr over 60 Minutes Intravenous Every 24 hours 09/24/21 2020 09/24/21 2225        Assessment/Plan Partial SBO vs ileus - hx Cystectomy and ileal neo-bladder 2006, transabdominal and transvaginal fistula repair 2006, hysterectomy - CT 5/23 shows dilated small bowel loops in the pelvis near surgical anastomotic site concerning for partial obstruction; small amount of free fluid is noted in the pelvis of uncertain significance; mild gastric distension; stool and contrast noted throughout the nondilated colon - Patient was seen last week and initially improved with suppositories and increased bowel regimen, symptoms were thought to be more related to ileus. General surgery signed off 5/26.  - Re-consulted 5/30 due to decreased bowel function and KUB with moderate gaseous distention of multiple small bowel loops and small amount of air in the colon - Patient given oral gastrograffin yesterday and she is clinically improved today. Having bowel movements and abdominal distension/ pain improving. Ok for clear liquids, advance as tolerated to full liquids. Continue daily suppositories and bowel regimen. - recommend mobilizing as able and keeping Mg >2.0 and K>4.0 to optimize bowel  function    ID - none currently; UCx with >100K colonies of Klebsiella pneumoniae, tx of UTI per primary team  VTE - lovenox FEN - CLD ADAT to FLD Foley - none   COPD exacerbation Acute respiratory failure with hypoxia and hypercapnia Tracheostomy/vent dependent respiratory failure Pulmonary nodules Hospital acquired PNA - completed abx Acute systolic heart failure Severe malnutrition HLD  I reviewed hospitalist notes, last 24 h vitals and pain scores, last 48 h intake and output, last 24 h labs and trends, and last 24 h imaging results    LOS: 43 days    Wellington Hampshire, [facility] Surgery 11/07/2021, 8:32 AM Please see Amion for pager number during day hours 7:00am-4:30pm

## 2021-11-07 NOTE — Progress Notes (Signed)
   NAME:  [name], MRN:  [id number], DOB:  11-21-1955, LOS: 9 ADMISSION DATE:  09/24/2021, CONSULTATION DATE:  4/18 REFERRING MD:  Tat/ triad, CHIEF COMPLAINT:  resp distress    History of Present Illness:  66 y.o. female quit smoking 2021 with  GOLD 3 COPD MZ, uncontrolled anxiety. Was on 02 one year PTA but "took herself off" per friend at bedside and able to care for pets at home but mostly housebound sinc ethen  She was admitted 4/17 to [facility], ICU with acute hypoxic/hypercarbic respiratory failure, failed BiPAP and required mechanical ventilation, transferred to [facility] 4/21 due to new finding of LV dysfunction.  Significant Hospital Events: Including procedures, antibiotic start and stop dates in addition to other pertinent events   ET  4/18 c/b hypotension with severe air trapping  Echo 4/20 with EF 40% , akinesis of apex and septal wall 4/24 CTA head negative for any significant abnormality 4/25 ketamine added 4/28 increased work of breathing during the wean, severe auto PEEP 4/29 Febrile 101.2 5/1 perc trach placement 5/15 trach changed to Shiley #6  cuffless 5/16 antibiotics resumed for inreasing resp distress, leukocytosis, increased LA 5/30 ileus/SBO  Interim History / Subjective:  Still with significant oxygen requirement Saturating about 90-92% Oxygen requirement remains about the same  Objective   Blood pressure 102/63, pulse (!) 102, temperature 98.2 F (36.8 C), temperature source Oral, resp. rate 18, height 5\' 1"  (1.549 m), weight 49 kg, SpO2 96 %.    FiO2 (%):  [35 %-40 %] 40 %   Intake/Output Summary (Last 24 hours) at 11/07/2021 0916 Last data filed at 11/07/2021 0500 Gross per 24 hour  Intake 260.46 ml  Output 1200 ml  Net -939.54 ml   Filed Weights   11/05/21 0500 11/06/21 0452 11/07/21 0500  Weight: 50.8 kg 50.9 kg 49 kg   Examination: General: Chronically ill-appearing HEENT: Moist oral mucosa Neck: Passy-Muir valve in place over  tracheostomy Cardio: S1-S2 appreciated with no murmur Resp: Decreased air movement bilaterally  neuro: Awake and alert, interactive Psych: calm, coopreative  5/17 blood cultures> NGTD CXR personally reviewed> 5/21 with lower lobe infiltrates CT 5/21 personally reviewedgreater than right lobes--- all new since 11/2020 lung cancer screening CT.   Assessment & Plan:   Abdominal distention Partial small bowel obstruction versus IBS -Did move her bowels yesterday -Passing gas -Tolerating orally at present  Leukocytosis Pulmonary nodules -Infectious disease input noted -Significance of lung nodules unclear at present -Cryptococcal antigen, histoplasma antigen, Blastomyces antigen ordered by infectious disease -Did complete course of antibiotics recently for pneumonia  Acute hypoxemic/hypercapnic respiratory failure Tracheostomy with vent dependent respiratory failure Treated for Klebsiella pneumonia Severe anxiety Severe protein calorie malnutrition Dysphagia  To continue trach per protocol Still with significant secretions  Hypertonic saline nebulization  Continue Brovana and Yupelri  Send tracheal cultures if new fevers   -Plan for CT scan of the chest in 6 to 8 weeks to follow-up for resolution of upper lobe nodules  -Follow-up weekly  Sherrilyn Rist, MD [place] P[facility]M Pager: See Kennyth Arnold, DO 11/07/21 9:16 AM [facility]

## 2021-11-07 NOTE — Progress Notes (Signed)
Occupational Therapy Treatment Patient Details Name: [name] MRN: [id number] DOB: 1955-11-27 Today's Date: 11/07/2021   History of present illness 66 y/o F reports diagnosis of PNA on 4/16 Admitted 4/17 to [facility], ICU with acute hypoxic/hypercarbic respiratory failure, failed BiPAP and required mechanical ventilation, transferred to [facility] 4/21 due to new finding of LV dysfunction Echo 4/20 with EF 40% , akinesis of apex and septal wall. . Failed weaning attempt 4/28. Trach placed on 5/1. PMH significant for COPD, tobacco use, HLD, GRED, and uncontrolled anxiety.   OT comments  Pt seen for OT session upon just returning to bed from chair. Pt agreeable to bed level exercises at this time. Pt able to perform UB/LB AROM/AAROM exercises supine in bed. Pt with decreased shoulder ROM, able to flex B shoulders ~70* during functional reach task. Elbow flexion/extension and digit composite flexion/extension weak but WFL. Administered Level I theraband for pt, demo'd use and pt verbalized understanding. Pt presenting with impairments listed below, will follow acutely. Continue to recommend AIR at d/c.   Recommendations for follow up therapy are one component of a multi-disciplinary discharge planning process, led by the attending physician.  Recommendations may be updated based on patient status, additional functional criteria and insurance authorization.    Follow Up Recommendations  Acute inpatient rehab (3hours/day)    Assistance Recommended at Discharge Frequent or constant Supervision/Assistance  Patient can return home with the following  Two people to help with walking and/or transfers;Two people to help with bathing/dressing/bathroom;Assistance with cooking/housework;Assistance with feeding;Direct supervision/assist for medications management;Direct supervision/assist for financial management;Assist for transportation;Help with stairs or ramp for entrance   Equipment Recommendations  Other  (comment)    Recommendations for Other Services Rehab consult    Precautions / Restrictions Precautions Precautions: Fall Precaution Comments: trach Restrictions Weight Bearing Restrictions: No       Mobility Bed Mobility               General bed mobility comments: pt declining as just returned to bed    Transfers                   General transfer comment: pt declining as just returned to bed     Balance                                           ADL either performed or assessed with clinical judgement   ADL Overall ADL's : Needs assistance/impaired Eating/Feeding: Set up;Sitting Eating/Feeding Details (indicate cue type and reason): able to hold cup and drink                                        Extremity/Trunk Assessment Upper Extremity Assessment Upper Extremity Assessment: RUE deficits/detail RUE Deficits / Details: Minimal movement against gravity. RUE Coordination: decreased fine motor;decreased gross motor LUE Deficits / Details: Minimal movement against gravity. LUE Coordination: decreased fine motor;decreased gross motor   Lower Extremity Assessment Lower Extremity Assessment: Defer to PT evaluation        Vision   Additional Comments: will further assess   Perception Perception Perception: Not tested   Praxis Praxis Praxis: Not tested    Cognition Arousal/Alertness: Awake/alert Behavior During Therapy: WFL for tasks assessed/performed Overall Cognitive Status: Within Functional Limits for tasks assessed Area  of Impairment: Awareness, Problem solving                       Following Commands: Follows one step commands consistently, Follows one step commands with increased time   Awareness: Emergent Problem Solving: Slow processing, Requires verbal cues, Requires tactile cues          Exercises General Exercises - Upper Extremity Shoulder Flexion: AROM, Both, 10 reps,  Supine Elbow Flexion: AROM, Both, 10 reps, Supine Elbow Extension: AROM, Both, 10 reps, Supine Digit Composite Flexion: AROM, Both, 10 reps, Supine Composite Extension: AROM, Both, 10 reps, Supine General Exercises - Lower Extremity Ankle Circles/Pumps: Both, 10 reps, Supine, AAROM Heel Slides: AAROM, Both, 10 reps, Supine    Shoulder Instructions       General Comments      Pertinent Vitals/ Pain       Pain Assessment Pain Assessment: No/denies pain Pain Intervention(s): Monitored during session  Home Living                                          Prior Functioning/Environment              Frequency  Min 2X/week        Progress Toward Goals  OT Goals(current goals can now be found in the care plan section)  Progress towards OT goals: Progressing toward goals  Acute Rehab OT Goals Patient Stated Goal: none stated OT Goal Formulation: With patient Time For Goal Achievement: 11/19/21 Potential to Achieve Goals: Good ADL Goals Additional ADL Goal #1: Pt will perform bed mobility with Min A in preparation for ADLs Additional ADL Goal #2: Pt will locate three grooming items with 2-3 cues Additional ADL Goal #3: Pt will perform AAROM of BUEs exercises with Mod A Additional ADL Goal #4: Pt will hold and utilize tooth brush sponge to sucessfully brush her teeth and mouth with min A.  Plan Discharge plan needs to be updated;Frequency remains appropriate    Co-evaluation                 AM-PAC OT "6 Clicks" Daily Activity     Outcome Measure   Help from another person eating meals?: A Little Help from another person taking care of personal grooming?: A Lot Help from another person toileting, which includes using toliet, bedpan, or urinal?: A Lot Help from another person bathing (including washing, rinsing, drying)?: A Lot Help from another person to put on and taking off regular upper body clothing?: A Lot Help from another person to  put on and taking off regular lower body clothing?: A Lot 6 Click Score: 13    End of Session    OT Visit Diagnosis: Unsteadiness on feet (R26.81);Other abnormalities of gait and mobility (R26.89);Muscle weakness (generalized) (M62.81)   Activity Tolerance Patient tolerated treatment well   Patient Left in bed;with call bell/phone within reach;with bed alarm set   Nurse Communication Mobility status        Time: 1533-1550 OT Time Calculation (min): 17 min  Charges: OT General Charges $OT Visit: 1 Visit OT Treatments $Therapeutic Activity: 8-22 mins  Alfonzo Beers, OTD, OTR/L Acute Rehab [phone]   Mayer Masker 11/07/2021, 4:05 PM

## 2021-11-07 NOTE — Progress Notes (Signed)
Brief nutrition follow-up:  Diet advanced to CL. NG tube out. +BM. Pt looks and appears to feel better on visit today. Pt reports she has had multiple cups of jello and multiple [place] ices on visit today and has tolerated without issue  Interventions:  Plan to resume Calorie Count once diet advanced to solids Add MVI with Minerals daily While on CL diet: Boost Breeze po TID, each supplement provides 250 kcal and 9 grams of protein 30 ml ProSource Plus TID, each supplement provides 100 kcals and 15 grams protein.  4.   If continues to tolerate CL, recommend advancing diet as tolerated  Romelle Starcher MS, RDN, LDN, CNSC Registered Dietitian III Clinical Nutrition RD Pager and On-Call Pager Number Located in [place]

## 2021-11-07 NOTE — Plan of Care (Signed)
  Problem: Respiratory: Goal: Levels of oxygenation will improve Outcome: Progressing   Problem: Respiratory: Goal: Ability to maintain adequate ventilation will improve Outcome: Progressing   Problem: Coping: Goal: Level of anxiety will decrease Outcome: Progressing

## 2021-11-08 ENCOUNTER — Encounter (HOSPITAL_COMMUNITY): Payer: Medicare Other

## 2021-11-08 DIAGNOSIS — Y95 Nosocomial condition: Secondary | ICD-10-CM

## 2021-11-08 DIAGNOSIS — R070 Pain in throat: Secondary | ICD-10-CM

## 2021-11-08 DIAGNOSIS — J9601 Acute respiratory failure with hypoxia: Secondary | ICD-10-CM | POA: Diagnosis not present

## 2021-11-08 DIAGNOSIS — R918 Other nonspecific abnormal finding of lung field: Secondary | ICD-10-CM

## 2021-11-08 DIAGNOSIS — Z930 Tracheostomy status: Secondary | ICD-10-CM

## 2021-11-08 DIAGNOSIS — I10 Essential (primary) hypertension: Secondary | ICD-10-CM

## 2021-11-08 DIAGNOSIS — J9602 Acute respiratory failure with hypercapnia: Secondary | ICD-10-CM | POA: Diagnosis not present

## 2021-11-08 DIAGNOSIS — D696 Thrombocytopenia, unspecified: Secondary | ICD-10-CM

## 2021-11-08 DIAGNOSIS — J189 Pneumonia, unspecified organism: Secondary | ICD-10-CM

## 2021-11-08 DIAGNOSIS — E785 Hyperlipidemia, unspecified: Secondary | ICD-10-CM

## 2021-11-08 HISTORY — DX: Tracheostomy status: Z93.0

## 2021-11-08 LAB — BASIC METABOLIC PANEL
Anion gap: 8 (ref 5–15)
BUN: 7 mg/dL — ABNORMAL LOW (ref 8–23)
CO2: 31 mmol/L (ref 22–32)
Calcium: 8.8 mg/dL — ABNORMAL LOW (ref 8.9–10.3)
Chloride: 91 mmol/L — ABNORMAL LOW (ref 98–111)
Creatinine, Ser: 0.41 mg/dL — ABNORMAL LOW (ref 0.44–1.00)
GFR, Estimated: >60 mL/min (ref >60)
Glucose, Bld: 124 mg/dL — ABNORMAL HIGH (ref 70–99)
Potassium: 3.6 mmol/L (ref 3.5–5.1)
Sodium: 130 mmol/L — ABNORMAL LOW (ref 135–145)

## 2021-11-08 LAB — MAGNESIUM: Magnesium: 2 mg/dL (ref 1.7–2.4)

## 2021-11-08 MED ORDER — CALCIUM CARBONATE ANTACID 500 MG PO CHEW: 1.0000 | CHEWABLE_TABLET | Freq: Three times a day (TID) | ORAL | Status: DC | PRN

## 2021-11-08 MED ORDER — MENTHOL 3 MG MT LOZG
1.0000 | LOZENGE | OROMUCOSAL | Status: DC | PRN
Start: 2021-11-08 — End: 2021-11-14

## 2021-11-08 MED ORDER — SIMETHICONE 80 MG PO CHEW
80.0000 mg | CHEWABLE_TABLET | Freq: Four times a day (QID) | ORAL | Status: DC | PRN
Start: 2021-11-08 — End: 2021-11-08

## 2021-11-08 MED ORDER — LIDOCAINE VISCOUS HCL 2 % MT SOLN
15.0000 mL | Freq: Once | OROMUCOSAL | Status: AC
  Administered 2021-11-08: 15 mL via ORAL
  Filled 2021-11-08: qty 15

## 2021-11-08 MED ORDER — FAMOTIDINE 20 MG PO TABS
20.0000 mg | ORAL_TABLET | Freq: Two times a day (BID) | ORAL | Status: DC | PRN
Start: 2021-11-08 — End: 2021-11-08
  Administered 2021-11-08: 20 mg via ORAL
  Filled 2021-11-08: qty 1

## 2021-11-08 MED ORDER — PANTOPRAZOLE SODIUM 40 MG PO TBEC
40.0000 mg | DELAYED_RELEASE_TABLET | Freq: Every day | ORAL | Status: DC
  Administered 2021-11-08 – 2021-11-14 (×7): 40 mg via ORAL
  Filled 2021-11-08 (×7): qty 1

## 2021-11-08 MED ORDER — ALUM & MAG HYDROXIDE-SIMETH 200-200-20 MG/5ML PO SUSP: 30.0000 mL | Freq: Four times a day (QID) | ORAL | Status: DC | PRN

## 2021-11-08 MED ORDER — ENSURE ENLIVE PO LIQD
237.0000 mL | Freq: Three times a day (TID) | ORAL | Status: DC
Start: 2021-11-08 — End: 2021-11-14
  Administered 2021-11-08 – 2021-11-13 (×9): 237 mL via ORAL

## 2021-11-08 MED ORDER — ALUM & MAG HYDROXIDE-SIMETH 200-200-20 MG/5ML PO SUSP
30.0000 mL | Freq: Four times a day (QID) | ORAL | Status: DC | PRN
  Administered 2021-11-09 – 2021-11-11 (×4): 30 mL via ORAL
  Filled 2021-11-08 (×4): qty 30

## 2021-11-08 NOTE — Assessment & Plan Note (Signed)
Resolved

## 2021-11-08 NOTE — Assessment & Plan Note (Addendum)
Initially developed abdominal pain, distension ~5/23.  CT showed dilated sm bowel, possible partial SBO, Gen surgery consulted, symptoms resolved with suppositories and increased bowel regimen, Gen Surg signed off.  Reconsulted 5/30 due to again decreased bowel function, radiograph showing distended bowel again, and again resolved spontanseously.    SBO ruled out.  General Surgery calling this an ileus.  Regardless, it is resolved again. - Avoid hypokalemia, hypocalcemia, hypomagnesemia - Continue bowel regimen  ADDENDUM: sister brought her a cheese-steak from [place] today, and she ate most of it.  Clearly ileus is resolved

## 2021-11-08 NOTE — Progress Notes (Signed)
Speech Language Pathology Treatment: [name] Speaking valve  Patient Details Name: [name] MRN: [id number] DOB: 12-Jan-1956 Today's Date: 11/08/2021 Time: [id number] SLP Time Calculation (min) (ACUTE ONLY): 19 min  Assessment / Plan / Recommendation Clinical Impression  Pt had PMV in place, talking with visitor, upon SLP arrival. SpO2 noted to be hovering at 84% and pt reported feeling like her breathing was a little labored. No back pressure was noted upon removal, and SpO2 did not improve with valve removal. However, pt did show improvements (back up to 88%, with PMV donned) when cued to take slow, deep breaths and coached on diaphragmatic breathing. Valve was left in place and encouraged pt to take frequent rest breaks from talking. Pt was nervous to attempt donning/doffing valve, but agreeable to try. SLP provided demonstration and then cued her throat removal and replacement x1 each. Tried to have pt remove it again, but she started to seem more anxious and SpO2 dropped to 84% again. Pt requested that SLP remove valve, so valve was doffed and SLP provided some additional cues to calm pt and her breathing. Will continue to follow and educate.   HPI HPI: Pt is a 66 yo female presenting to [facility] with acute hypoxic/hypercarbic respiratory failure requiring intubation 4/19 followed by transfer to [facility] after new finding of LV dysfunction. Trach 5/1. PMH includes: COPD, previously on home O2 but "took herself off," former smoker (quit 2021), uncontrolled anxiety      SLP Plan  Continue with current plan of care      Recommendations for follow up therapy are one component of a multi-disciplinary discharge planning process, led by the attending physician.  Recommendations may be updated based on patient status, additional functional criteria and insurance authorization.    Recommendations         Patient may use Passy-Muir Speech Valve: During all waking hours (remove during sleep) PMSV  Supervision: Intermittent         Oral Care Recommendations: Oral care BID Follow Up Recommendations: Skilled nursing-short term rehab (<3 hours/day) Assistance recommended at discharge: Frequent or constant Supervision/Assistance SLP Visit Diagnosis: Aphonia (R49.1) Plan: Continue with current plan of care           Osie Bond., M.A. [place] Office [phone]  Secure chat preferred   11/08/2021, 11:29 AM

## 2021-11-08 NOTE — Progress Notes (Signed)
Patient ID: [name], female   DOB: 28-May-1956, 66 y.o.   MRN: [id number] [facility] Surgery Progress Note     Subjective: CC-  Tolerating liquids but feels a little more bloated today. Having some heartburn. Denies n/v. Continues to pass flatus. 1 small BM since I saw her yesterday.  Objective: Vital signs in last 24 hours: Temp:  [98.2 F (36.8 C)-99.1 F (37.3 C)] 98.2 F (36.8 C) (06/01 0413) Pulse Rate:  [99-116] 107 (06/01 0812) Resp:  [17-22] 18 (06/01 0812) BP: (102-126)/(63-70) 126/67 (06/01 0413) SpO2:  [86 %-96 %] 92 % (06/01 0812) FiO2 (%):  [40 %] 40 % (06/01 0812) Weight:  [51.4 kg] 51.4 kg (06/01 0500) Last BM Date : 11/07/21  Intake/Output from previous day: 05/31 0701 - 06/01 0700 In: 0  Out: 900 [Urine:900] Intake/Output this shift: No intake/output data recorded.  PE: General: chronically ill appearing female who is laying in bed in NAD Heart: regular, rate, and rhythm.   Lungs:  Respiratory effort nonlabored, trach present with PMV Abd: distended but soft, nontender Psych: A&Ox3 with an appropriate affect  Lab Results:  Recent Labs    11/06/21 0753 11/07/21 0441  WBC 25.5* 20.3*  HGB 10.4* 9.3*  HCT 32.0* 28.6*  PLT 341 298   BMET Recent Labs    11/06/21 0753 11/07/21 0441  NA 131* 133*  K 3.7 4.0  CL 90* 96*  CO2 32 32  GLUCOSE 120* 94  BUN 9 10  CREATININE 0.37* <0.30*  CALCIUM 9.0 8.4*   PT/INR No results for input(s): LABPROT, INR in the last 72 hours. CMP     Component Value Date/Time   NA 133 (L) 11/07/2021 0441   K 4.0 11/07/2021 0441   CL 96 (L) 11/07/2021 0441   CO2 32 11/07/2021 0441   GLUCOSE 94 11/07/2021 0441   BUN 10 11/07/2021 0441   CREATININE <0.30 (L) 11/07/2021 0441   CALCIUM 8.4 (L) 11/07/2021 0441   PROT 6.3 (L) 10/27/2021 0432   ALBUMIN 1.9 (L) 10/27/2021 0432   AST 38 10/27/2021 0432   ALT 86 (H) 10/27/2021 0432   ALKPHOS 200 (H) 10/27/2021 0432   BILITOT 0.4 10/27/2021 0432   GFRNONAA  NOT CALCULATED 11/07/2021 0441   GFRAA >60 05/14/2019 1651   Lipase     Component Value Date/Time   LIPASE 23 05/14/2019 1651       Studies/Results: DG Abd Portable 1V  Result Date: 11/07/2021 CLINICAL DATA:  Small bowel obstruction. Large bowel movement this morning. EXAM: PORTABLE ABDOMEN - 1 VIEW COMPARISON:  11/06/2021. FINDINGS: Flocculated barium in the small bowel with majority of contrast in the colon. IMPRESSION: Majority of the contrast is seen in the colon. No evidence of a small-bowel obstruction. Electronically Signed   By: Leanna Battles M.D.   On: 11/07/2021 08:54   DG Abd Portable 1V-Small Bowel Obstruction Protocol-initial, 8 hr delay  Result Date: 11/06/2021 CLINICAL DATA:  Small bowel follow through. Images obtained @ 2215. EXAM: PORTABLE ABDOMEN - 1 VIEW COMPARISON:  X-ray abdomen 530 20:38 a.m., x-ray abdomen 11/05/2021, CT abdomen pelvis 10/30/2021 FINDINGS: PO contrast overlies the gastric lumen. Persistent gaseous dilatation of the small bowel. No radio-opaque calculi or other significant radiographic abnormality are seen. Metallic densities overlying the lower abdomen consistent with vertebral spine surgical hardware. IMPRESSION: 1. PO contrast remains within the gastric lumen. 2. Persistent gaseous dilatation of the small bowel. Electronically Signed   By: Tish Frederickson M.D.   On: 11/06/2021 22:49  Anti-infectives: Anti-infectives (From admission, onward)    Start     Dose/Rate Route Frequency Ordered Stop   11/02/21 0000  ceFAZolin (ANCEF) IVPB 2g/100 mL premix        2 g 200 mL/hr over 30 Minutes Intravenous To Radiology 11/01/21 0957 11/03/21 0000   10/25/21 0400  vancomycin (VANCOCIN) IVPB 1000 mg/200 mL premix  Status:  Discontinued        1,000 mg 200 mL/hr over 60 Minutes Intravenous Every 24 hours 10/24/21 0411 10/25/21 1513   10/24/21 1600  ceFEPIme (MAXIPIME) 2 g in sodium chloride 0.9 % 100 mL IVPB  Status:  Discontinued        2 g 200 mL/hr  over 30 Minutes Intravenous Every 12 hours 10/24/21 0411 10/29/21 1457   10/24/21 0430  ceFEPIme (MAXIPIME) 2 g in sodium chloride 0.9 % 100 mL IVPB        2 g 200 mL/hr over 30 Minutes Intravenous  Once 10/24/21 0343 10/24/21 0719   10/24/21 0400  vancomycin (VANCOREADY) IVPB 1250 mg/250 mL        1,250 mg 166.7 mL/hr over 90 Minutes Intravenous  Once 10/24/21 0343 10/24/21 0603   10/06/21 1330  cefTRIAXone (ROCEPHIN) 2 g in sodium chloride 0.9 % 100 mL IVPB        2 g 200 mL/hr over 30 Minutes Intravenous Every 24 hours 10/06/21 1242 10/08/21 1529   10/04/21 0900  ceFEPIme (MAXIPIME) 2 g in sodium chloride 0.9 % 100 mL IVPB  Status:  Discontinued        2 g 200 mL/hr over 30 Minutes Intravenous Every 12 hours 10/04/21 0816 10/06/21 1242   10/03/21 0900  cefTRIAXone (ROCEPHIN) 1 g in sodium chloride 0.9 % 100 mL IVPB  Status:  Discontinued        1 g 200 mL/hr over 30 Minutes Intravenous Every 24 hours 10/03/21 0819 10/04/21 0816   09/25/21 2030  azithromycin (ZITHROMAX) tablet 500 mg       See Hyperspace for full Linked Orders Report.   500 mg Oral Daily 09/24/21 2020 09/28/21 0829   09/24/21 2020  azithromycin (ZITHROMAX) 500 mg in sodium chloride 0.9 % 250 mL IVPB       See Hyperspace for full Linked Orders Report.   500 mg 250 mL/hr over 60 Minutes Intravenous Every 24 hours 09/24/21 2020 09/24/21 2225        Assessment/Plan Partial SBO vs ileus - hx Cystectomy and ileal neo-bladder 2006, transabdominal and transvaginal fistula repair 2006, hysterectomy - CT 5/23 shows dilated small bowel loops in the pelvis near surgical anastomotic site concerning for partial obstruction; small amount of free fluid is noted in the pelvis of uncertain significance; mild gastric distension; stool and contrast noted throughout the nondilated colon - Patient was seen last week and initially improved with suppositories and increased bowel regimen, symptoms were thought to be more related to ileus.  General surgery signed off 5/26.  - Re-consulted 5/30 due to decreased bowel function and KUB with moderate gaseous distention of multiple small bowel loops and small amount of air in the colon - Started SBO protocol with oral gastrograffin 5/31 and follow up film showed contrast in colon with no signs of SBO - Patient is having some bowel function but is a little more distended today. Seems more like an ileus than true obstruction. Continue full liquid diet and bowel regimen (miralax/ colace and suppositories). Encourage mobilization. Continue to monitor electrolytes and keep Mag>2 and K>4. Add  protonix for reflux.   ID - none currently; UCx with >100K colonies of Klebsiella pneumoniae, tx of UTI per primary team  VTE - lovenox FEN - FLD Foley - none  COPD exacerbation Acute respiratory failure with hypoxia and hypercapnia Tracheostomy/vent dependent respiratory failure Pulmonary nodules Hospital acquired PNA - completed abx Acute systolic heart failure Severe malnutrition HLD  I reviewed hospitalist notes, last 24 h vitals and pain scores, last 48 h intake and output, last 24 h labs and trends, and last 24 h imaging results    LOS: 44 days    Franne Forts, PA-C [facility] 11/08/2021, 9:00 AM Please see Amion for pager number during day hours 7:00am-4:30pm

## 2021-11-08 NOTE — Progress Notes (Signed)
  Progress Note   Patient: [name]:[id number] DOB: November 09, 1955 DOA: 09/24/2021     44 DOS: the patient was seen and examined on 11/08/2021 at 1:30AM and 6:15PM      Brief hospital course: [name] is a 66 y.o. F with COPD, anxiety who presented with difficulty breathing, wheezing and cough for 1 week.    In the ER, SpO2 80s, in distress.  Placed on BiPAP.  STarted on treatment for COPD.     4/17: Admitted to [facility] and started on treatment 4/18: ABG 7.09/105/232/31 on BiPAP, intubated  4/20: Echo showed reduced EF 4/21: Transferred to [facility] from [facility] for Cardiology evaluation 4/29: New fever 5/1: [name] placed 5/11: Advanced to regular diet but low oral intake noted 5/15: Cuffless trach, transferred OOU 5/17: Tracheal aspirate showed Klebsiella     Assessment and Plan: Assessment and Plan: * Acute respiratory failure with hypoxia and hypercapnia (HCC) - Continue LABA, ICS, LAMA - Continue clonazepam - Continue SABA PRN - Continue Singulair, PPI   COPD exacerbation (HCC) See above  Throat pain Has some ulcers in back of throat, whitish without surrounding redness, doubt candida.  Suspect her throat pain and swallowing difficulty are from this. - Trial viscous lidocaine  Lung nodules - Consult ID, appreciate cares  Thrombocytopenia (HCC) Resolved  Tracheostomy dependence (HCC)    Hospital-acquired pneumonia Resolved  Hyperlipidemia    Essential hypertension BP soft - Continue metoprolol  Acute metabolic encephalopathy Resolved  Protein-calorie malnutrition, severe - Continue nutritional supplements - Consult dietitian - Possible Cortrak tomorrow  Acute on chronic systolic CHF (congestive heart failure) (HCC) 4/20 Echo--EF 40%, AK apex and anteroseptal, G1DD BP soft precludes ARB, spironolactone - Continue metoprolol  Hypernatremia Resolved  Lactic acidosis Driven primarily from her hypoxia and respiratory failure Sepsis ruled  out  Hyperglycemia - secondary to the use of his steroids -No prior history of diabetes -4/18 check A1c--5.4 Add novolog q 4 hours  GERD (gastroesophageal reflux disease) - Continue PPI  Anxiety - Continue clonazepam  Hypokalemia Resolved  SBO (small bowel obstruction) (HCC) Resolved - ADAT              Subjective: History of pain, has had 2 bowel movements a day, no fever, no cough, no dyspnea, no abdominal pain with eating     Physical Exam: Vitals:   11/08/21 0900 11/08/21 1155 11/08/21 1510 11/08/21 1741  BP: 122/62   117/65  Pulse: (!) 101 98 81   Resp: 18 18 16 16   Temp: 98.4 F (36.9 C)   98 F (36.7 C)  TempSrc: Oral   Oral  SpO2: 93% (!) 89% 92% 92%  Weight:      Height:       Thin elderly female, lying in bed, no acute distress, appears weak Tracheostomy in place, respiratory effort weak, lungs clear without rales or wheezes, overall diminished however RRR, no murmurs, no peripheral edema Abdomen slightly distended but no apparent no tenderness to palpation, bowel sounds normal   Data Reviewed: Discussed with general surgery team Patient metabolic panel reviewed, unremarkable Hemogram reviewed, mild anemia, otherwise normal         Disposition: Status is: Inpatient         Author: , MD 11/08/2021 6:17 PM  For on call review [email or web address].

## 2021-11-08 NOTE — Assessment & Plan Note (Addendum)
In mid-May, patient developed symptoms of pneumonia.  Tracheal aspiration growing Klebsiella.  Completed antibiotic course and resolved

## 2021-11-08 NOTE — Assessment & Plan Note (Signed)
Noted incidentally, ID consulted.  Crypto, histo and blasto Ags negative.   - Pulm recommend CT scan of the chest in 6 to 8 weeks to follow-up for resolution of likely infectious upper lobe nodules

## 2021-11-08 NOTE — Assessment & Plan Note (Signed)
BP soft - Continue meto;rolol 

## 2021-11-08 NOTE — Assessment & Plan Note (Addendum)
Baseline FEV1 32% in 2021. - Continue LABA, ICS, LAMA - Continue SABA PRN - Continue Singulair, PPI

## 2021-11-08 NOTE — Assessment & Plan Note (Signed)
Has some ulcers in back of throat, whitish without surrounding redness, doubt candida.  Suspect her throat pain and swallowing difficulty are from this. - Trial viscous lidocaine

## 2021-11-08 NOTE — Assessment & Plan Note (Addendum)
-   Continue nutritional supplements - Consult dietitian - Possible Cortrak tomorrow

## 2021-11-08 NOTE — Progress Notes (Signed)
Nutrition Follow-up  DOCUMENTATION CODES:   Severe malnutrition in context of chronic illness  INTERVENTION:   Continue to recommend PEG placement at this time In the interim, if no plan to place PEG or if PEG placement will be delayed, recommend re-inserting Cortrak and initiation of TF.  Discussed with Attending MD and Surgery. McKenzie for Cortrak/PEG placement from a surgical perspective. Attending MD to discuss further with patient  Ensure Enlive po TID, each supplement provides 350 kcal and 20 grams of protein.  D/C Boost Breeze  Continue MVI with Minerals  Continue 30 ml ProSource Plus BID, each supplement provides 100 kcals and 15 grams protein.   If patient struggling with GI dysmotility, recommend considering reglan or erythromycin  Encourage HOB >30 degrees (or higher) at all times when pt taking anything by mouth to promote GI tolerance, reduce reflux   NUTRITION DIAGNOSIS:   Severe Malnutrition related to chronic illness (COPD) as evidenced by severe muscle depletion, severe fat depletion.  Continues  GOAL:   Patient will meet greater than or equal to 90% of their needs  Not Met  MONITOR:   PO intake, Labs, Weight trends, Skin, I & O's, TF tolerance  REASON FOR ASSESSMENT:   Ventilator    ASSESSMENT:   Pt admitted with difficulty breathing d/t acute respiratory failure with hypoxia and hypercapnia. Pt with diagnosis of PNA 4/16. PMH significant for COPD, uncontrolled anxiety.  5/23 TF held, Cortrak removed, CT with dilated SB loops, NPO 5/25 FL diet 5/26 Soft diet, Surgery signed off 5/29 Abd xray worsening 5/30 NPO, Abd xray with gaeous distention of SB loops, small amount of air in colon 5/31 Tolerated CL diet well  On visit today, pt does not appear to be feeling as well as she did yesterday.   Diet advanced to FL this AM. Upon RD assessment, lunch tray on tray table and untouched. Pt reports she did not eat any breakfast either today. Boost Breeze  unopened in patient room; pt reports she did not try Boost Breeze yesterday or today. Pt stated she would take a Chocolate Ensure. RD obtained and pt took a few sips.  Limited documentation of po intake from yesterday but previous recorded po intake mostly 0%  Also noted pt lying relatively flat in bed on visit today, pt remained in that position when taking po liquids. Encouraged pt to elevate HOB to least 30 degrees (higher if able) when taking po to promote GI tolerance of po and hopefully decrease acid reflux  Today pt states she wants to eat but barriers to po intake today are abdominal discomfort/bloating and worsening reflux (pt reports this has worsened over the last several days). On prior assessments, pt does appear to have the desire to eat but has been unable to consistently accomplish this goal, This was relayed to Attending MD.   Pt has been off nutritional supplement via TF since 5/23. RD believes that it will be difficult for patient to continue to progress without improved nutrition.   Spoke to Surgery who states that patient does not have a true SBO, likely more related to dysmotility, ileus type picture. +large BM today, large BM yesterday as well  Pt with limited ability to work with PT given severe deconditioning. Pt is able to stand and take a few steps but requires frequent breaks and max assist.   Pt continues to report pain on buttocks-noted stage II on coccyx, MASD to bilateral buttocks  Labs: sodium 131 (L) Meds: MVI with Minerals,  colace, miralax, senokot, protonix, Tums prn, dulcolax suppository, myl[place] prn   Diet Order:   Diet Order             Diet full liquid Room service appropriate? Yes; Fluid consistency: Thin  Diet effective now                   EDUCATION NEEDS:   No education needs have been identified at this time  Skin:  Skin Assessment: Skin Integrity Issues: Skin Integrity Issues:: Stage II, Other (Comment) DTI: n/a Stage II:  coccyx Other: MASD to bilateral buttocks  Last BM:  6/01 Large Type 6  Height:   Ht Readings from Last 1 Encounters:  10/27/21 '5\' 1"'  (1.549 m)    Weight:   Wt Readings from Last 1 Encounters:  11/08/21 51.4 kg    BMI:  Body mass index is 21.41 kg/m.  Estimated Nutritional Needs:   Kcal:  1700-1900 kcals  Protein:  90-110 g  Fluid:  >/= 1.7 L  Kerman Passey MS, RDN, LDN, CNSC Registered Dietitian III Clinical Nutrition RD Pager and On-Call Pager Number Located in [place]

## 2021-11-09 DIAGNOSIS — R8271 Bacteriuria: Secondary | ICD-10-CM

## 2021-11-09 DIAGNOSIS — G9341 Metabolic encephalopathy: Secondary | ICD-10-CM

## 2021-11-09 LAB — POTASSIUM: Potassium: 3.8 mmol/L (ref 3.5–5.1)

## 2021-11-09 LAB — BLASTOMYCES ANTIGEN: Blastomyces Antigen: NOT DETECTED ng/mL

## 2021-11-09 LAB — MAGNESIUM: Magnesium: 1.9 mg/dL (ref 1.7–2.4)

## 2021-11-09 NOTE — Progress Notes (Signed)
Brief Nutrition Note  Cortrak placement was recommended by RD yesterday and discussed with MD. Followed-up on conversation with provider this AM who expressed that pt did not want the tube placed and seemed quite motivated to eat. However, did agree that if pt needed nutrition support and would not be able to meet needs without it, could proceed if pt agreed.  Visited with pt at bedside this AM. Pt states that she has done well with intake this AM. Family brought biscuits and gravy from [employer] which was 100% consumed at bedside. States she has also been drinking supplements (several unopened boost breeze and prosource packets unopened on bedside table) eating ice cream, fruit, and various other foods. Pt states that she didn't eat last night because of heart burn and also reports that she is having heart burn this AM, so she is going to take it easy at lunch.  Talked to pt about cortrak tube placement today. Pt does reiterate that she did not want the tube placed. States she feels she can meet her needs if we "give her a chance" states she was doing fine until she was put back on liquids and she was just able to eat real food again this AM. Reminded pt that she just told me she had missed dinner and was planning on holding off on lunch. Discussed the nutrition deficit that was being created by skipping meals and the relationship between her poor energy and nutrition reserves, Discussed her inability to participate with therapy and regain functional status if she was not giving her body the energy to do so.   Spoke to pt about ways we could increase oral intake. Encouraged her to have her family bring her food, to bring in snacks that she liked, to ask nursing staff for ensures and snacks if feeling hungry in addition to her meals. Encouraged her to treat food as part of her medical treatment. Did agree that we would work on this goal over the weekend and determine if meeting needs orally was going to be  achievable. Also reiterated to pt that if cortrak tube was placed, it would be a short term intervention and would not be in place forever. Pt is in agreement with this plan. Pt does state that if tube is needed, she will need medication to help calm her during placement.  Will start calorie count and determine on Monday if needs were met and if cortrak tube is needed to supplement oral intake with enteral feeds.  RN present for conversation.

## 2021-11-09 NOTE — Progress Notes (Signed)
PT Cancellation Note  Patient Details Name: [name] MRN: [id number] DOB: [birth year]   Cancelled Treatment:    Reason Eval/Treat Not Completed: Patient declined, no reason specified;Fatigue/lethargy limiting ability to participate Patient refusing therapy this date due to fatigue and requesting to return at later date.   Latronda Spink A. Gilford Rile PT, DPT Acute Rehabilitation Services Pager [phone] Office [phone]    Linna Hoff 11/09/2021, 4:04 PM

## 2021-11-09 NOTE — Progress Notes (Signed)
Chief Complaint/Subjective: Tolerated liquid diet yesterday, +BM yesterday, no abdominal pain  Objective: Vital signs in last 24 hours: Temp:  [98 F (36.7 C)-98.8 F (37.1 C)] 98.2 F (36.8 C) (06/02 0508) Pulse Rate:  [81-116] 97 (06/02 0753) Resp:  [16-24] 24 (06/02 0753) BP: (109-123)/(59-67) 109/59 (06/02 0508) SpO2:  [89 %-97 %] 91 % (06/02 0753) FiO2 (%):  [40 %-60 %] 60 % (06/02 0753) Last BM Date : 11/07/21 Intake/Output from previous day: 06/01 0701 - 06/02 0700 In: 1320 [P.O.:1320] Out: 450 [Urine:450] Intake/Output this shift: No intake/output data recorded.  PE: Gen: NAD Resp: PM valve in place Card: RRR Abd: soft, NT  Lab Results:  Recent Labs    11/07/21 0441  WBC 20.3*  HGB 9.3*  HCT 28.6*  PLT 298   BMET Recent Labs    11/07/21 0441 11/08/21 1044 11/09/21 0232  NA 133* 130*  --   K 4.0 3.6 3.8  CL 96* 91*  --   CO2 32 31  --   GLUCOSE 94 124*  --   BUN 10 7*  --   CREATININE <0.30* 0.41*  --   CALCIUM 8.4* 8.8*  --    PT/INR No results for input(s): LABPROT, INR in the last 72 hours. CMP     Component Value Date/Time   NA 130 (L) 11/08/2021 1044   K 3.8 11/09/2021 0232   CL 91 (L) 11/08/2021 1044   CO2 31 11/08/2021 1044   GLUCOSE 124 (H) 11/08/2021 1044   BUN 7 (L) 11/08/2021 1044   CREATININE 0.41 (L) 11/08/2021 1044   CALCIUM 8.8 (L) 11/08/2021 1044   PROT 6.3 (L) 10/27/2021 0432   ALBUMIN 1.9 (L) 10/27/2021 0432   AST 38 10/27/2021 0432   ALT 86 (H) 10/27/2021 0432   ALKPHOS 200 (H) 10/27/2021 0432   BILITOT 0.4 10/27/2021 0432   GFRNONAA >60 11/08/2021 1044   GFRAA >60 05/14/2019 1651   Lipase     Component Value Date/Time   LIPASE 23 05/14/2019 1651    Studies/Results: DG Abd Portable 1V  Result Date: 11/07/2021 CLINICAL DATA:  Small bowel obstruction. Large bowel movement this morning. EXAM: PORTABLE ABDOMEN - 1 VIEW COMPARISON:  11/06/2021. FINDINGS: Flocculated barium in the small bowel with majority  of contrast in the colon. IMPRESSION: Majority of the contrast is seen in the colon. No evidence of a small-bowel obstruction. Electronically Signed   By: Leanna Battles M.D.   On: 11/07/2021 08:54    Anti-infectives: Anti-infectives (From admission, onward)    Start     Dose/Rate Route Frequency Ordered Stop   11/02/21 0000  ceFAZolin (ANCEF) IVPB 2g/100 mL premix        2 g 200 mL/hr over 30 Minutes Intravenous To Radiology 11/01/21 0957 11/03/21 0000   10/25/21 0400  vancomycin (VANCOCIN) IVPB 1000 mg/200 mL premix  Status:  Discontinued        1,000 mg 200 mL/hr over 60 Minutes Intravenous Every 24 hours 10/24/21 0411 10/25/21 1513   10/24/21 1600  ceFEPIme (MAXIPIME) 2 g in sodium chloride 0.9 % 100 mL IVPB  Status:  Discontinued        2 g 200 mL/hr over 30 Minutes Intravenous Every 12 hours 10/24/21 0411 10/29/21 1457   10/24/21 0430  ceFEPIme (MAXIPIME) 2 g in sodium chloride 0.9 % 100 mL IVPB        2 g 200 mL/hr over 30 Minutes Intravenous  Once 10/24/21 0343 10/24/21 0719  10/24/21 0400  vancomycin (VANCOREADY) IVPB 1250 mg/250 mL        1,250 mg 166.7 mL/hr over 90 Minutes Intravenous  Once 10/24/21 0343 10/24/21 0603   10/06/21 1330  cefTRIAXone (ROCEPHIN) 2 g in sodium chloride 0.9 % 100 mL IVPB        2 g 200 mL/hr over 30 Minutes Intravenous Every 24 hours 10/06/21 1242 10/08/21 1529   10/04/21 0900  ceFEPIme (MAXIPIME) 2 g in sodium chloride 0.9 % 100 mL IVPB  Status:  Discontinued        2 g 200 mL/hr over 30 Minutes Intravenous Every 12 hours 10/04/21 0816 10/06/21 1242   10/03/21 0900  cefTRIAXone (ROCEPHIN) 1 g in sodium chloride 0.9 % 100 mL IVPB  Status:  Discontinued        1 g 200 mL/hr over 30 Minutes Intravenous Every 24 hours 10/03/21 0819 10/04/21 0816   09/25/21 2030  azithromycin (ZITHROMAX) tablet 500 mg       See Hyperspace for full Linked Orders Report.   500 mg Oral Daily 09/24/21 2020 09/28/21 0829   09/24/21 2020  azithromycin (ZITHROMAX) 500 mg  in sodium chloride 0.9 % 250 mL IVPB       See Hyperspace for full Linked Orders Report.   500 mg 250 mL/hr over 60 Minutes Intravenous Every 24 hours 09/24/21 2020 09/24/21 2225       Assessment/Plan  Partial SBO vs ileus - hx Cystectomy and ileal neo-bladder 2006, transabdominal and transvaginal fistula repair 2006, hysterectomy - CT 5/23 shows dilated small bowel loops in the pelvis near surgical anastomotic site concerning for partial obstruction; small amount of free fluid is noted in the pelvis of uncertain significance; mild gastric distension; stool and contrast noted throughout the nondilated colon - Patient was seen last week and initially improved with suppositories and increased bowel regimen, symptoms were thought to be more related to ileus. General surgery signed off 5/26.  - Re-consulted 5/30 due to decreased bowel function and KUB with moderate gaseous distention of multiple small bowel loops and small amount of air in the colon - XR yesterday with resolution of dilation and contrast in colon, tolerated more liquids yesterday and on regular diet today -ileus appears to have resolved. Continue to optimize electrolytes  ID - none currently; UCx with >100K colonies of Klebsiella pneumoniae, tx of UTI per primary team  VTE - lovenox FEN - FLD Foley - none   LOS: 45 days   I reviewed last 24 h vitals and pain scores, last 48 h intake and output, last 24 h labs and trends, and last 24 h imaging results.  This care required moderate level of medical decision making.   [facility] Surgery 11/09/2021, 8:00 AM Please see Amion for pager number during day hours 7:00am-4:30pm or 7:00am -11:30am on weekends

## 2021-11-09 NOTE — Progress Notes (Signed)
   11/09/21 1618  Assess: MEWS Score  Temp 99 F (37.2 C)  BP 116/69  MAP (mmHg) 81  Pulse Rate (!) 113  Resp (!) 26  SpO2 95 %  O2 Device Tracheostomy Collar  O2 Flow Rate (L/min) 10 L/min  Assess: MEWS Score  MEWS Temp 0  MEWS Systolic 0  MEWS Pulse 2  MEWS RR 2  MEWS LOC 0  MEWS Score 4  MEWS Score Color Red  Assess: if the MEWS score is Yellow or Red  Were vital signs taken at a resting state? Yes  Focused Assessment No change from prior assessment  Does the patient meet 2 or more of the SIRS criteria? Yes  Does the patient have a confirmed or suspected source of infection? No  MEWS guidelines implemented *See Row Information* Yes  Treat  MEWS Interventions Administered scheduled meds/treatments  Pain Scale 0-10  Pain Score 0  Patients Stated Pain Goal 0  Pain Intervention(s) Relaxation;Rest  Take Vital Signs  Increase Vital Sign Frequency  Red: Q 1hr X 4 then Q 4hr X 4, if remains red, continue Q 4hrs  Escalate  MEWS: Escalate Red: discuss with charge nurse/RN and provider, consider discussing with RRT  Notify: Charge Nurse/RN  Name of Charge Nurse/RN Notified Vonna Kotyk  Date Charge Nurse/RN Notified 11/09/21  Time Charge Nurse/RN Notified 1640  Notify: Provider  Provider Name/Title [place]  Date Provider Notified 11/09/21  Time Provider Notified 1640  Method of Notification Page  Notification Reason Other (Comment)  Document  Patient Outcome Stabilized after interventions  Assess: SIRS CRITERIA  SIRS Temperature  0  SIRS Pulse 1  SIRS Respirations  1  SIRS WBC 0  SIRS Score Sum  2

## 2021-11-09 NOTE — Progress Notes (Signed)
Progress Note   Patient: [name] [id number] DOB: 12-06-1955 DOA: 09/24/2021     45 DOS: the patient was seen and examined on 11/09/2021 at 8:50AM      Brief hospital course: Mrs. Leider is a 66 y.o. F with COPD, anxiety who presented with difficulty breathing, wheezing and cough for 1 week.    In the ER, SpO2 80s, in distress.  Placed on BiPAP.  STarted on treatment for COPD.     4/17: Admitted to [facility] and started on treatment 4/18: ABG 7.09/105/232/31 on BiPAP, intubated  4/20: Echo showed reduced EF 4/21: Transferred to [facility] from [place] for Cardiology evaluation 4/29: New fever 5/1: Lurline Idol placed 5/11: Advanced to regular diet but low oral intake noted 5/15: Cuffless trach, transferred OOU 5/17: Tracheal aspirate showed Klebsiella     Assessment and Plan: * Acute respiratory failure with hypoxia and hypercapnia (HCC) - Continue LABA, ICS, LAMA - Continue clonazepam - Continue SABA PRN - Continue Singulair, PPI   COPD exacerbation (HCC) See above  Throat pain Has some ulcers in back of throat, whitish without surrounding redness, doubt candida.  Suspect her throat pain and swallowing difficulty are from this. - Trial viscous lidocaine  Asymptomatic bacteriuria Urine culture 5/29 with Klebsiella.  Asymptomatic, UTI ruled out at this time.    Lung nodules Noted incidentally, ID consulted.  Crypto, histo and blasto Ags negative.   - Pulm recommend CT scan of the chest in 6 to 8 weeks to follow-up for resolution of likely infectious upper lobe nodules  Thrombocytopenia ([place]) Resolved  Tracheostomy dependence ([place])    Hospital-acquired pneumonia Resolved  Hyperlipidemia - Continue Crestor   Essential hypertension BP soft - Continue metoprolol  Pressure injury of skin Stage 2, coccyx, not present on admission  Acute metabolic encephalopathy Resolved  Protein-calorie malnutrition, severe - Continue nutritional supplements - Consult dietitian     Acute on chronic systolic CHF (congestive heart failure) ([place]) 4/20 Echo--EF 40%, AK apex and anteroseptal, G1DD BP soft precludes ARB, spironolactone - Continue metoprolol  Hypernatremia Resolved  Lactic acidosis Driven primarily from her hypoxia and respiratory failure Sepsis ruled out  Hyperglycemia - secondary to the use of his steroids -No prior history of diabetes -4/18 check A1c--5.4 Add novolog q 4 hours  GERD (gastroesophageal reflux disease) - Continue PPI  Anxiety - Continue clonazepam  Hypokalemia Resolved  Ileus (HCC) Initially developed abdominal pain, distension ~5/23.  CT showed dilated sm bowel, possible partial SBO, Gen surgery consulted, symptoms resolved with suppositories and increased bowel regimen, Gen Surg signed off.  Reconsulted 5/30 due to again decreased bowel function, radiograph showing distended bowel again, and again resolved spontanseously.    SBO ruled out.  General Surgery calling this an ileus.  Regardless, it is resolved again. - Avoid hypokalemia, hypocalcemia, hypomagnesemia - Continue bowel regimen           Subjective: No complaints.  Throat pain better.     Physical Exam: Vitals:   11/09/21 0809 11/09/21 0951 11/09/21 1049 11/09/21 1104  BP:  127/66    Pulse: (!) 104 (!) 108  92  Resp: 20 20  18   Temp:  98.3 F (36.8 C)    TempSrc:  Oral    SpO2: 93% 91% 90% 92%  Weight:      Height:       Elderly chronically ill-appearing adult female, lying in bed, appears debilitated Heart rate tachycardic, regular, no murmurs, no peripheral edema Respiratory rate shallow, respiratory effort weak, tracheostomy  in place, no secretions Abdomen soft no tenderness palpation or guarding, mild distention She is very deconditioned and cannot lift her legs, bilaterally due to generalized weakness, face symmetric, speech fluent, attention normal, affect appropriate, oriented to person, place, and time  Data Reviewed: Discussed  at length with dietitian Magnesium normal, potassium normal   Family Communication: Female relative at the bedside    Disposition: Status is: Inpatient The patient was admitted with respiratory failure, she had a prolonged intubation, required tracheostomy, and now has severe deconditioning.  She has had some intermittent difficulty with bowel function as a result of ileus, but this is mostly resolved, and she has no physical barrier to eating at this point.  She is medically ready for discharge When appropriate rehab facility can be found        Author: Edwin Dada, MD 11/09/2021 1:19 PM  For on call review [email or web address].

## 2021-11-09 NOTE — Progress Notes (Signed)
Occupational Therapy Treatment Patient Details Name: [name] MRN: [id number] DOB: 10/24/1955 Today's Date: 11/09/2021   History of present illness 66 y/o F reports diagnosis of PNA on 4/16 Admitted 4/17 to [facility], ICU with acute hypoxic/hypercarbic respiratory failure, failed BiPAP and required mechanical ventilation, transferred to [facility] 4/21 due to new finding of LV dysfunction Echo 4/20 with EF 40% , akinesis of apex and septal wall. . Failed weaning attempt 4/28. Trach placed on 5/1. PMH significant for COPD, tobacco use, HLD, GRED, and uncontrolled anxiety.   OT comments  Pt making limited progress this session. She is reporting nausea and weakness. This session, pt only wanted to participate in repositioning in bed and minimal exercises. Continuing to recommend AIR as pt is able to tolerate increased therapy.    Recommendations for follow up therapy are one component of a multi-disciplinary discharge planning process, led by the attending physician.  Recommendations may be updated based on patient status, additional functional criteria and insurance authorization.    Follow Up Recommendations  Acute inpatient rehab (3hours/day)    Assistance Recommended at Discharge Frequent or constant Supervision/Assistance  Patient can return home with the following  Two people to help with walking and/or transfers;Two people to help with bathing/dressing/bathroom;Assistance with cooking/housework;Assistance with feeding;Direct supervision/assist for medications management;Direct supervision/assist for financial management;Assist for transportation;Help with stairs or ramp for entrance   Equipment Recommendations  Other (comment) (TBD)    Recommendations for Other Services      Precautions / Restrictions Precautions Precautions: Fall Precaution Comments: trach Restrictions Weight Bearing Restrictions: No       Mobility Bed Mobility Overal bed mobility: Needs Assistance Bed  Mobility: Rolling Rolling: Min assist         General bed mobility comments: Min A for complete roll    Transfers                   General transfer comment: Pt refused due to nausea     Balance                                           ADL either performed or assessed with clinical judgement   ADL Overall ADL's : Needs assistance/impaired     Grooming: Wash/dry hands;Wash/dry face;Set up;Bed level Grooming Details (indicate cue type and reason): Refused to sit up thissession due to nausea and decreased sats                               General ADL Comments: Pt remained bed level this session    Extremity/Trunk Assessment              Vision       Perception     Praxis      Cognition Arousal/Alertness: Lethargic Behavior During Therapy: Flat affect Overall Cognitive Status: Impaired/Different from baseline Area of Impairment: Safety/judgement, Awareness, Problem solving                         Safety/Judgement: Decreased awareness of safety, Decreased awareness of deficits Awareness: Emergent Problem Solving: Slow processing, Requires verbal cues, Requires tactile cues General Comments: Requiring increased time and cuing        Exercises Exercises: Other exercises Other Exercises Other Exercises: AAROM BUE- 5 reps: shoulder flexion, elbow flex/ext, digit  composite flex/ext Other Exercises: AAROM BLE- heel slides, ankle pumps, and hip adductions/abductions with knees bent    Shoulder Instructions       General Comments Decreased spO2 ~85-89% on 60% FIO2 and 10L    Pertinent Vitals/ Pain       Pain Assessment Pain Assessment: No/denies pain  Home Living                                          Prior Functioning/Environment              Frequency  Min 2X/week        Progress Toward Goals  OT Goals(current goals can now be found in the care plan section)   Progress towards OT goals: Not progressing toward goals - comment (nauseous and declining mobility this session)  Acute Rehab OT Goals Patient Stated Goal: To feel better OT Goal Formulation: With patient Time For Goal Achievement: 11/19/21 Potential to Achieve Goals: Good ADL Goals Additional ADL Goal #1: Pt will perform bed mobility with Min A in preparation for ADLs Additional ADL Goal #2: Pt will locate three grooming items with 2-3 cues Additional ADL Goal #3: Pt will perform AAROM of BUEs exercises with Mod A Additional ADL Goal #4: Pt will hold and utilize tooth brush sponge to sucessfully brush her teeth and mouth with min A.  Plan Discharge plan remains appropriate;Frequency remains appropriate    Co-evaluation                 AM-PAC OT "6 Clicks" Daily Activity     Outcome Measure   Help from another person eating meals?: A Little Help from another person taking care of personal grooming?: A Lot Help from another person toileting, which includes using toliet, bedpan, or urinal?: A Lot Help from another person bathing (including washing, rinsing, drying)?: A Lot Help from another person to put on and taking off regular upper body clothing?: A Lot Help from another person to put on and taking off regular lower body clothing?: A Lot 6 Click Score: 13    End of Session Equipment Utilized During Treatment: Oxygen  OT Visit Diagnosis: Unsteadiness on feet (R26.81);Other abnormalities of gait and mobility (R26.89);Muscle weakness (generalized) (M62.81)   Activity Tolerance Patient limited by fatigue   Patient Left in bed;with call bell/phone within reach;with bed alarm set   Nurse Communication Mobility status;Other (comment) (spO2 needs)        Time: 0254-2706 OT Time Calculation (min): 14 min  Charges: OT General Charges $OT Visit: 1 Visit OT Treatments $Therapeutic Exercise: 8-22 mins  Gaila Engebretsen H., OTR/L Acute Rehabilitation  Samanda Buske Elane  [place] 11/09/2021, 4:50 PM

## 2021-11-09 NOTE — Assessment & Plan Note (Signed)
Stage 2, coccyx, not present on admission

## 2021-11-09 NOTE — Progress Notes (Signed)
   11/09/21 1724  Assess: MEWS Score  Temp 98.7 F (37.1 C)  BP 122/67  MAP (mmHg) 83  Pulse Rate (!) 107  Resp (!) 25  SpO2 99 %  O2 Device Tracheostomy Collar  O2 Flow Rate (L/min) 10 L/min  Assess: MEWS Score  MEWS Temp 0  MEWS Systolic 0  MEWS Pulse 1  MEWS RR 1  MEWS LOC 0  MEWS Score 2  MEWS Score Color Yellow  Assess: if the MEWS score is Yellow or Red  Were vital signs taken at a resting state? Yes  Focused Assessment Change from prior assessment (see assessment flowsheet)  Does the patient meet 2 or more of the SIRS criteria? Yes  Does the patient have a confirmed or suspected source of infection? No  MEWS guidelines implemented *See Row Information* Yes  Treat  MEWS Interventions Administered scheduled meds/treatments  Pain Scale 0-10  Pain Score 0  Patients Stated Pain Goal 0  Pain Intervention(s) Rest  Document  Patient Outcome Stabilized after interventions  Assess: SIRS CRITERIA  SIRS Temperature  0  SIRS Pulse 1  SIRS Respirations  1  SIRS WBC 0  SIRS Score Sum  2

## 2021-11-09 NOTE — Progress Notes (Signed)
IP rehab admissions - Noted patient now on regular diet with thin liquids and had + BM this am.  Patient does not feel ready for CIR today.  Will await medical readiness from MD and then can potentially admit to CIR.  Call me for questions.  [phone]

## 2021-11-09 NOTE — NC FL2 (Signed)
[place] LEVEL OF CARE SCREENING TOOL     IDENTIFICATION  Patient Name: [name] Birthdate: [birth year] Sex: female Admission Date (Current Location): 09/24/2021  [facility] and [place] Number:  Herbalist and Address:  The [place]. 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   Thrombocytopenia ([place]) 11/08/2021   Throat pain 11/08/2021   Lung nodules 11/08/2021   Pressure injury of skin 25/10/3974   Acute metabolic encephalopathy    Protein-calorie malnutrition, severe 10/03/2021   Acute on chronic systolic CHF (congestive heart failure) ([place]) 09/28/2021   Hypernatremia 09/27/2021   Acute respiratory failure with hypoxia and hypercapnia (HCC) 09/26/2021   Lactic acidosis 09/26/2021   GERD (gastroesophageal reflux disease) 09/25/2021   Hyperglycemia 09/25/2021   Anxiety 09/24/2021   Physical deconditioning 02/06/2021   Stridor 12/28/2020   Shortness of breath 12/19/2020   Hypokalemia 05/01/2019   Ileus ([place]) 04/28/2019   SIRS (systemic inflammatory response syndrome) ([place]) 04/28/2019   Tobacco abuse 04/28/2019   Chronic pain 04/28/2019   AKI (acute kidney injury) ([place]) 04/28/2019   Acute respiratory failure ([place]) 08/17/2013   COPD exacerbation ([place]) 08/16/2013    Orientation RESPIRATION BLADDER Height & Weight     Self, Time, Situation,  Place  Tracheostomy, O2 (10L) Incontinent, External catheter Weight: 113 lb 5.1 oz (51.4 kg) Height:  '5\' 1"'  (154.9 cm)  BEHAVIORAL SYMPTOMS/MOOD NEUROLOGICAL BOWEL NUTRITION STATUS      Continent Diet  AMBULATORY STATUS COMMUNICATION OF NEEDS Skin   Extensive Assist Verbally PU Stage and Appropriate Care                       Personal Care Assistance Level of Assistance  Bathing, Dressing, Feeding Bathing Assistance: Limited assistance Feeding assistance: Independent Dressing Assistance: Limited assistance     Functional Limitations Info  Sight, Hearing, Speech Sight Info: Adequate Hearing Info: Adequate Speech Info: Adequate    SPECIAL CARE FACTORS FREQUENCY  PT (By licensed PT), OT (By licensed OT)     PT Frequency: 5x weekly OT Frequency: 5x weekly            Contractures Contractures Info: Not present    Additional Factors Info  Code Status, Allergies, Suctioning Needs, Psychotropic Code Status Info: Full Code Allergies Info: No Known Allergies Psychotropic Info: Seroquel     Suctioning Needs: Frequent suctioning   Current Medications (11/09/2021):  This is the current hospital active medication list Current Facility-Administered Medications  Medication Dose Route Frequency Provider Last Rate Last Admin   (feeding supplement) PROSource Plus liquid 30 mL  30 mL Oral TID WC Danford, Suann Larry, MD   30 mL at 11/09/21 1202   0.9 %  sodium chloride infusion   Intravenous PRN Sherrilyn Rist A, MD   Stopped at 10/17/21 0748   0.9 %  sodium chloride infusion  250 mL Intravenous Continuous Leslye Peer  M, MD       acetaminophen (TYLENOL) tablet 650 mg  650 mg Oral Q6H PRN Mariel Aloe, MD       Or   acetaminophen (TYLENOL) suppository 650 mg  650 mg Rectal Q6H PRN Mariel Aloe, MD       albuterol (PROVENTIL) (2.5 MG/3ML) 0.083% nebulizer solution 2.5 mg  2.5 mg Nebulization BID Mariel Aloe, MD   2.5 mg at 11/09/21 0749   alum & mag  hydroxide-simeth (MAALOX/MYLANTA) 200-200-20 MG/5ML suspension 30 mL  30 mL Oral Q6H PRN Edwin Dada, MD   30 mL at 11/09/21 1301   arformoterol (BROVANA) nebulizer solution 15 mcg  15 mcg Nebulization BID Orson Eva, MD   15 mcg at 11/09/21 0752   bisacodyl (DULCOLAX) suppository 10 mg  10 mg Rectal Daily Norm Parcel, PA-C   10 mg at 11/08/21 1010   budesonide (PULMICORT) nebulizer solution 0.5 mg  0.5 mg Nebulization BID Tat, Shanon Brow, MD   0.5 mg at 11/09/21 0752   chlorhexidine gluconate (MEDLINE KIT) (PERIDEX) 0.12 % solution 15 mL  15 mL Mouth Rinse BID Olalere, Adewale A, MD   15 mL at 11/09/21 0958   clonazePAM (KLONOPIN) tablet 0.5 mg  0.5 mg Oral TID Mariel Aloe, MD   0.5 mg at 11/09/21 0957   docusate (COLACE) 50 MG/5ML liquid 100 mg  100 mg Oral BID PRN Mariel Aloe, MD       docusate sodium (COLACE) capsule 100 mg  100 mg Oral BID Meuth, Brooke A, PA-C   100 mg at 11/09/21 0957   enoxaparin (LOVENOX) injection 40 mg  40 mg Subcutaneous Q24H Mariel Aloe, MD   40 mg at 11/09/21 1301   feeding supplement (ENSURE ENLIVE / ENSURE PLUS) liquid 237 mL  237 mL Oral TID BM Danford, Suann Larry, MD   237 mL at 11/09/21 1300   Gerhardt's butt cream   Topical QID Collene Gobble, MD   Given at 11/09/21 1202   guaiFENesin-dextromethorphan (ROBITUSSIN DM) 100-10 MG/5ML syrup 5 mL  5 mL Oral Q4H PRN Mariel Aloe, MD       levalbuterol Penne Lash) nebulizer solution 0.63 mg  0.63 mg Nebulization Q4H PRN Orson Eva, MD   0.63 mg at 11/05/21 2322   liver oil-zinc oxide (DESITIN) 40 % ointment   Topical PRN Maryjane Hurter, MD   Given at 11/06/21 1724   LORazepam (ATIVAN) injection 1 mg  1 mg Intravenous Q6H PRN Dahal, Marlowe Aschoff, MD   1 mg at 11/08/21 0743   menthol-cetylpyridinium (CEPACOL) lozenge 3 mg  1 lozenge Oral PRN Edwin Dada, MD       metoprolol tartrate (LOPRESSOR) tablet 12.5 mg  12.5 mg Oral BID Mariel Aloe, MD   12.5 mg at 11/09/21 0957    montelukast (SINGULAIR) tablet 10 mg  10 mg Oral q AM Mariel Aloe, MD   10 mg at 11/09/21 0529   morphine (PF) 2 MG/ML injection 2-4 mg  2-4 mg Intravenous Q4H PRN Mariel Aloe, MD   4 mg at 11/05/21 0815   multivitamin with minerals tablet 1 tablet  1 tablet Oral Daily Danford, Suann Larry, MD   1 tablet at 11/09/21 0957   ondansetron (ZOFRAN) injection 4 mg  4 mg Intravenous Q6H PRN Mariel Aloe, MD   4 mg at 11/09/21 1013   oxyCODONE (Oxy IR/ROXICODONE) immediate release tablet 5 mg  5 mg Oral Q8H Nettey,  Evalee Jefferson, MD   5 mg at 11/09/21 0529   pantoprazole (PROTONIX) EC tablet 40 mg  40 mg Oral Daily Meuth, Brooke A, PA-C   40 mg at 11/09/21 0956   polyethylene glycol (MIRALAX / GLYCOLAX) packet 17 g  17 g Oral BID Mariel Aloe, MD   17 g at 11/08/21 2121   prochlorperazine (COMPAZINE) injection 10 mg  10 mg Intravenous Q6H PRN Mariel Aloe, MD   10 mg at 11/05/21 0816   QUEtiapine (SEROQUEL) tablet 75 mg  75 mg Oral BID Mariel Aloe, MD   75 mg at 11/09/21 0957   revefenacin (YUPELRI) nebulizer solution 175 mcg  175 mcg Nebulization Daily Tat, Shanon Brow, MD   175 mcg at 11/09/21 1049   rosuvastatin (CRESTOR) tablet 20 mg  20 mg Oral Daily Mariel Aloe, MD   20 mg at 11/09/21 0957   senna (SENOKOT) tablet 8.6 mg  1 tablet Oral Daily Mariel Aloe, MD   8.6 mg at 11/09/21 1259   white petrolatum (VASELINE) gel   Topical PRN Rigoberto Noel, MD   Given at 10/19/21 2210     Discharge Medications: Please see discharge summary for a list of discharge medications.  Relevant Imaging Results:  Relevant Lab Results:   Additional Information SSN: [id number]  Archie Endo, LCSW

## 2021-11-09 NOTE — TOC Progression Note (Addendum)
Transition of Care [facility]) - Progression Note    Patient Details  Name: [name] MRN: [id number] Date of Birth: Mar 09, 1956  Transition of Care [facility]) CM/SW [place], RN Phone Number: 11/09/2021, 2:11 PM  Clinical Narrative:    CM spoke with Dr. Loleta Books, attending physician and [name], CM with CIR and the patient states that she is not ready to go to CIR at this time.  [name], CM with CIR states that an inpatient rehab bed will not be available until Monday.  I met with the patient at the bedside and placed her [name] that she could participate with sharing her goals for care concerning rehab.  The patient states that she is just recovering from an ileus and states that she would still like to pursue CIR as first choice but is agreeable to SNF placement as a backup plan for therapy.    FL2 was completed and the patient was faxed out in the hub for SNF placement as a backup plan for CIR.   The patient is presently on 10L/min O2 via trach collar and is not medically stable for transfer to a SNF until her oxygen requirements are less than 6L/min.  I spoke with Frederich Balding, Admissions director with [facility] and she states that she will continue to follow the patient for possible bed offer once her oxygen requirements are improved for admission.  I spoke with the attending physician, Dr. Loleta Books and he will speak with pulmonary medicine.   I will call and make the patient's sister, [name] aware of the patient's potential plan for CIR versus SNF placement to that her family can continue discuss with the patient to make a definitive decision for rehabilitation - pending next week and O2 improvements.  LTAC has been declined by insurance recently - including an appeal.  The patient's sister states that CIR was the patient/family's preference for rehab and the sister states that she has available family to care for the patient in the home.  The sister that  that the patient has been either npo or on clear liquids for most of the week and has felt weak due to poor po intake due to ileus.  The patient has been cleared by Dr. Kieth Brightly for an ileus in the notes and the family continues to support CIR for admission - [name], Admissions coordinator and attending are aware.  CM and MSW with DTP Team will continue to follow the patient for CIR admission - with [facility] placement only as back up plan per patient/family choice.   Expected Discharge Plan: IP Rehab Facility Barriers to Discharge: Continued Medical Work up  Expected Discharge Plan and Services Expected Discharge Plan: [place] In-house Referral: Clinical Social Work Discharge Planning Services: CM Consult Post Acute Care Choice: IP Rehab Living arrangements for the past 2 months: Single Family Home                                       Social Determinants of Health (SDOH) Interventions    Readmission Risk Interventions    10/26/2021    5:04 PM 10/26/2021    5:02 PM  Readmission Risk Prevention Plan  Medication Review (RN Care Manager)  Complete  PCP or Specialist appointment within 3-5 days of discharge Complete   Skilled [place] Complete

## 2021-11-09 NOTE — Progress Notes (Signed)
   11/09/21 1835  Assess: MEWS Score  Temp 98.9 F (37.2 C)  BP 125/73  MAP (mmHg) 88  Pulse Rate (!) 117  Resp (!) 23  SpO2 94 %  O2 Device Tracheostomy Collar  O2 Flow Rate (L/min) 10 L/min  Assess: MEWS Score  MEWS Temp 0  MEWS Systolic 0  MEWS Pulse 2  MEWS RR 1  MEWS LOC 0  MEWS Score 3  MEWS Score Color Yellow  Assess: if the MEWS score is Yellow or Red  Were vital signs taken at a resting state? Yes  Focused Assessment No change from prior assessment  Does the patient meet 2 or more of the SIRS criteria? Yes  Does the patient have a confirmed or suspected source of infection? No  MEWS guidelines implemented *See Row Information* Yes  Treat  Pain Scale 0-10  Pain Score 0  Patients Stated Pain Goal 0  Pain Intervention(s) Rest  Document  Patient Outcome Stabilized after interventions  Assess: SIRS CRITERIA  SIRS Temperature  0  SIRS Pulse 1  SIRS Respirations  1  SIRS WBC 0  SIRS Score Sum  2

## 2021-11-09 NOTE — Assessment & Plan Note (Signed)
Urine culture 5/29 with Klebsiella.  Asymptomatic, UTI ruled out at this time.

## 2021-11-09 NOTE — Progress Notes (Signed)
   11/09/21 1724  Assess: MEWS Score  Temp 98.7 F (37.1 C)  BP 122/67  MAP (mmHg) 83  Pulse Rate (!) 107  Resp (!) 25  SpO2 99 %  O2 Device Tracheostomy Collar  O2 Flow Rate (L/min) 10 L/min  Assess: MEWS Score  MEWS Temp 0  MEWS Systolic 0  MEWS Pulse 1  MEWS RR 1  MEWS LOC 0  MEWS Score 2  MEWS Score Color Yellow  Assess: if the MEWS score is Yellow or Red  Were vital signs taken at a resting state? Yes  Focused Assessment No change from prior assessment  Does the patient meet 2 or more of the SIRS criteria? Yes  Does the patient have a confirmed or suspected source of infection? No  MEWS guidelines implemented *See Row Information* Yes  Treat  MEWS Interventions Administered scheduled meds/treatments  Pain Scale 0-10  Pain Score 0  Patients Stated Pain Goal 0  Pain Intervention(s) Rest  Document  Patient Outcome Stabilized after interventions  Assess: SIRS CRITERIA  SIRS Temperature  0  SIRS Pulse 1  SIRS Respirations  1  SIRS WBC 0  SIRS Score Sum  2

## 2021-11-10 NOTE — Plan of Care (Signed)
  Problem: Respiratory: Goal: Ability to maintain a clear airway will improve Outcome: Progressing   Problem: Respiratory: Goal: Levels of oxygenation will improve Outcome: Progressing   

## 2021-11-10 NOTE — Progress Notes (Signed)
  Progress Note   Patient: [name] WVP:[id number] DOB: 05-01-1956 DOA: 09/24/2021     46 DOS: the patient was seen and examined on 11/10/2021 at 8:50AM      Brief hospital course: Mrs. Leamer is a 66 y.o. F with COPD, anxiety who presented with difficulty breathing, wheezing and cough for 1 week.    In the ER, SpO2 80s, in distress.  Placed on BiPAP.  STarted on treatment for COPD.     4/17: Admitted to [facility] and started on treatment 4/18: ABG 7.09/105/232/31 on BiPAP, intubated  4/20: Echo showed reduced EF 4/21: Transferred to [facility] from [facility] for Cardiology evaluation 4/29: New fever 5/1: [name] placed 5/11: Advanced to regular diet but low oral intake noted 5/15: Cuffless trach, transferred OOU 5/17: Tracheal aspirate showed Klebsiella     Assessment and Plan: * Acute respiratory failure with hypoxia and hypercapnia (HCC) - Continue LABA, ICS, LAMA - Continue clonazepam - Continue SABA PRN - Continue Singulair, PPI     Hyperlipidemia - Continue Crestor   Essential hypertension BP stable - Continue metoprolol    Protein-calorie malnutrition, severe - Continue nutritional supplements - Consult dietitian      Ileus (HCC) - Avoid hypokalemia, hypocalcemia, hypomagnesemia - Continue bowel regimen           Subjective: Tired, a little belly distention, a little nausea, mostly just fatigued and weak, no dyspnea, no fever, no change in sputum, chest pain     Physical Exam: Vitals:   11/10/21 0930 11/10/21 1115 11/10/21 1126 11/10/21 1611  BP:   101/67 116/73  Pulse: (!) 120 (!) 112 99 96  Resp: (!) 22 (!) 22 19 19   Temp:   98.7 F (37.1 C) 97.9 F (36.6 C)  TempSrc:   Oral Oral  SpO2: 93% 92% 94% 93%  Weight:      Height:       Chronically ill-appearing adult female, lying in bed, appears debilitated, tracheostomy with blow-by oxygen Heart rate tachycardic, regular, no peripheral edema Respiratory shallow, lung sounds  diminished. Generalized weakness, speech fluent with passy muir, face  symmetric     Data Reviewed: None new     Disposition: Status is: Inpatient The patient was admitted with respiratory failure, she had a prolonged intubation, required tracheostomy, and now has severe deconditioning.  She has had some intermittent difficulty with bowel function as a result of ileus, but this is mostly resolved, and she has no physical barrier to eating at this point.  She is medically ready for discharge When appropriate rehab facility can be found        Author: , MD 11/10/2021 4:49 PM  For on call review [email or web address].

## 2021-11-11 LAB — CBC
HCT: 28.2 % — ABNORMAL LOW (ref 36.0–46.0)
Hemoglobin: 9.3 g/dL — ABNORMAL LOW (ref 12.0–15.0)
MCH: 31.7 pg (ref 26.0–34.0)
MCHC: 33 g/dL (ref 30.0–36.0)
MCV: 96.2 fL (ref 80.0–100.0)
Platelets: 297 10*3/uL (ref 150–400)
RBC: 2.93 MIL/uL — ABNORMAL LOW (ref 3.87–5.11)
RDW: 14.7 % (ref 11.5–15.5)
WBC: 18.9 10*3/uL — ABNORMAL HIGH (ref 4.0–10.5)
nRBC: 0 % (ref 0.0–0.2)

## 2021-11-11 LAB — BASIC METABOLIC PANEL
Anion gap: 5 (ref 5–15)
BUN: 8 mg/dL (ref 8–23)
CO2: 35 mmol/L — ABNORMAL HIGH (ref 22–32)
Calcium: 8.2 mg/dL — ABNORMAL LOW (ref 8.9–10.3)
Chloride: 90 mmol/L — ABNORMAL LOW (ref 98–111)
Creatinine, Ser: 0.32 mg/dL — ABNORMAL LOW (ref 0.44–1.00)
GFR, Estimated: >60 mL/min (ref >60)
Glucose, Bld: 97 mg/dL (ref 70–99)
Potassium: 3.6 mmol/L (ref 3.5–5.1)
Sodium: 130 mmol/L — ABNORMAL LOW (ref 135–145)

## 2021-11-11 NOTE — Progress Notes (Signed)
  Progress Note   Patient: [name] [id number] DOB: [birth year] DOA: 09/24/2021     47 DOS: the patient was seen and examined on 11/11/2021 at 8:50AM      Brief hospital course: See prior summary 6/3     Assessment and Plan: * Acute respiratory failure with hypoxia and hypercapnia (HCC) Resolved  COPD Tracheostomy dependence -Continue LABA, ICS, LAMA - Continue SABA PRN - Continue Singulair, PPI  Hyperlipidemia - Continue Crestor  Essential hypertension BP normal - Continue metoprolol   Anxiety -Continue Seroquel, clonazepam  Protein-calorie malnutrition, severe - Continue nutritional supplements - Consult dietitian   Ileus Resolved - Continue bowel regimen - Avoid hypokalemia hypocalcemia, hypomagnesemia            Subjective: Feels like she is improving.  Had a bowel movement today.  Distended but no fever, respiratory status changed     Physical Exam: Vitals:   11/11/21 0757 11/11/21 0922 11/11/21 1020 11/11/21 1454  BP:  120/70    Pulse: (!) 110 (!) 106 (!) 102 (!) 104  Resp: 20 17 18 18   Temp:  98 F (36.7 C)    TempSrc:  Oral    SpO2: 96% 90% 93% 94%  Weight:      Height:       Chronically ill-appearing adult female, lying in bed, appears debilitated, tracheostomy with blow-by oxygen Heart rate tachycardic, regular, mild nonpitting peripheral edema Respiratory effort shallow, lung sounds diminished but no rales or wheezes Attention normal, affect appropriate, generalized weakness, speech fluent with Passy-Muir valve, face symmetric      Data Reviewed: None new     Disposition: Status is: Inpatient The patient was admitted with respiratory failure, she had a prolonged intubation, required tracheostomy, and now has severe deconditioning.  She has had some intermittent difficulty with bowel function as a result of ileus, but this is mostly resolved, and she has no physical barrier to eating at this point.  She is  medically ready for discharge When appropriate rehab facility can be found        Author: Edwin Dada, MD 11/11/2021 3:22 PM  For on call review [email or web address].

## 2021-11-11 NOTE — Plan of Care (Signed)
  Problem: Activity: Goal: Ability to tolerate increased activity will improve Outcome: Progressing   Problem: Respiratory: Goal: Ability to maintain a clear airway will improve Outcome: Progressing   Problem: Respiratory: Goal: Levels of oxygenation will improve Outcome: Progressing   Problem: Respiratory: Goal: Ability to maintain adequate ventilation will improve Outcome: Progressing   

## 2021-11-12 DIAGNOSIS — J9601 Acute respiratory failure with hypoxia: Secondary | ICD-10-CM | POA: Diagnosis not present

## 2021-11-12 DIAGNOSIS — F419 Anxiety disorder, unspecified: Secondary | ICD-10-CM | POA: Diagnosis not present

## 2021-11-12 DIAGNOSIS — J9602 Acute respiratory failure with hypercapnia: Secondary | ICD-10-CM | POA: Diagnosis not present

## 2021-11-12 DIAGNOSIS — I5023 Acute on chronic systolic (congestive) heart failure: Secondary | ICD-10-CM | POA: Diagnosis not present

## 2021-11-12 MED ORDER — METOCLOPRAMIDE HCL 5 MG/ML IJ SOLN
5.0000 mg | Freq: Once | INTRAMUSCULAR | Status: AC
  Administered 2021-11-12: 5 mg via INTRAVENOUS
  Filled 2021-11-12: qty 2

## 2021-11-12 NOTE — Progress Notes (Signed)
Nutrition Follow-up  DOCUMENTATION CODES:   Severe malnutrition in context of chronic illness  INTERVENTION:   RD believes that pt would still benefit PEG tube placement; pt continues to not consistently meet nutrition needs; pt is severely deconditioned, severely malnourished  Add Magic cup BID with meals, each supplement provides 290 kcal and 9 grams of protein  Ensure Enlive po TID, each supplement provides 350 kcal and 20 grams of protein.  MVI with Minerals   NUTRITION DIAGNOSIS:   Severe Malnutrition related to chronic illness (COPD) as evidenced by severe muscle depletion, severe fat depletion.  Being addressed via   GOAL:   Patient will meet greater than or equal to 90% of their needs  Progressing  MONITOR:   PO intake, Labs, Weight trends, Skin, I & O's, TF tolerance  REASON FOR ASSESSMENT:   Ventilator    ASSESSMENT:   Pt admitted with difficulty breathing d/t acute respiratory failure with hypoxia and hypercapnia. Pt with diagnosis of PNA 4/16. PMH significant for COPD, uncontrolled anxiety.  Today pt reports she is not feeling well, +nausea (despite receiving nausea meds at 10am per RN) and reports diarrhea; pt did not eat breakfast but did take some Ensure. Lunch tray and bedside, pt ate part of orange sherbet but nothing else.   Altered GI function continues to be a barrier to adequate po intake.   Pt reports she ate better over the weekend but RD unable to fully assess. Recorded po intake 0-75% of meals, 33% of meals on average. Of note, one meal consisted only of 2 jello cups and 1 orange sherbet and a ginger ale. No solid food.  Limited documentation of po intake over the weekend via Calorie Count. Pt did eat 4 ounces of seabass brought in my family; drank some Ensure as well  Pt reports hx of IBS; reports chronic back and forth between constipation and diarrhea  Labs: sodium 130 (L) Meds: senna, miralax, oxycodone, MVI with Minerals,  colace   Diet Order:   Diet Order             Diet regular Room service appropriate? Yes; Fluid consistency: Thin  Diet effective now                   EDUCATION NEEDS:   No education needs have been identified at this time  Skin:  Skin Assessment: Skin Integrity Issues: Skin Integrity Issues:: Stage II, Other (Comment) DTI: n/a Stage II: coccyx Other: MASD to bilateral buttocks  Last BM:  6/6  Height:   Ht Readings from Last 1 Encounters:  10/27/21 5\' 1"  (1.549 m)    Weight:   Wt Readings from Last 1 Encounters:  11/08/21 51.4 kg    BMI:  Body mass index is 21.41 kg/m.  Estimated Nutritional Needs:   Kcal:  1700-1900 kcals  Protein:  90-110 g  Fluid:  >/= 1.7 L   Kerman Passey MS, RDN, LDN, CNSC Registered Dietitian III Clinical Nutrition RD Pager and On-Call Pager Number Located in [place]

## 2021-11-12 NOTE — Progress Notes (Signed)
   NAME:  [name], MRN:  [id number], DOB:  04/17/1956, LOS: 48 ADMISSION DATE:  09/24/2021, CONSULTATION DATE:  4/18 REFERRING MD:  Tat/ triad, CHIEF COMPLAINT:  resp distress    History of Present Illness:  66 y.o. female quit smoking 2021 with  GOLD 3 COPD, uncontrolled severe anxiety. She was admitted 4/17 to [facility], ICU with acute hypoxic/hypercarbic respiratory failure, failed BiPAP and required mechanical ventilation, transferred to [facility] 4/21 due to new finding of LV dysfunction.  Now status post trach on 5/1  Significant Hospital Events: Including procedures, antibiotic start and stop dates in addition to other pertinent events   ET  4/18 c/b hypotension with severe air trapping  Echo 4/20 with EF 40% , akinesis of apex and septal wall 4/24 CTA head negative for any significant abnormality 4/25 ketamine added 4/28 increased work of breathing during the wean, severe auto PEEP 4/29 Febrile 101.2 5/1 perc trach placement 5/15 trach changed to Shiley #6  cuffless 5/16 antibiotics resumed for inreasing resp distress, leukocytosis, increased LA 5/30 ileus/SBO  Interim History / Subjective:  Still with significant oxygen requirement between 60 to 80% Saturating about 90-92%  Objective   Blood pressure 119/65, pulse 97, temperature 98.2 F (36.8 C), temperature source Oral, resp. rate 18, height 5\' 1"  (1.549 m), weight 51.4 kg, SpO2 93 %.    FiO2 (%):  [60 %-80 %] 60 %   Intake/Output Summary (Last 24 hours) at 11/12/2021 0919 Last data filed at 11/12/2021 0301 Gross per 24 hour  Intake 480 ml  Output 900 ml  Net -420 ml   Filed Weights   11/06/21 0452 11/07/21 0500 11/08/21 0500  Weight: 50.9 kg 49 kg 51.4 kg   Examination: General: Chronically ill-appearing female, lying on the vent HEENT: Moist oral mucosa Neck: Passy-Muir valve in place over tracheostomy Cardio: S1-S2 appreciated with no murmur Resp: Clear to auscultation bilaterally neuro: Awake and alert,  interactive Psych: calm, coopreative  Assessment & Plan:   Acute hypoxemic/hypercapnic respiratory failure s/p tracheostomy Treated for Klebsiella pneumonia COPD Severe anxiety  Continue trach care per protocol Continue trach collar as tolerated Titrate oxygen with O2 sat goal 88-92% Still requiring significant amount of oxygen, on not a candidate for decannulation Hypertonic saline nebulization Continue Brovana and 01/08/22, MD [place] [facility] See Amion for pager If no response to pager, please call [phone] until 7pm After 7pm, Please call E-link [phone]

## 2021-11-12 NOTE — Clinical Note (Incomplete)
Inpatient Rehab Admissions Coordinator:   Pt. Is

## 2021-11-12 NOTE — Progress Notes (Signed)
Occupational Therapy Treatment Patient Details Name: Lianne CureBeverly S Bufkin MRN: [id number] DOB: [birth year] Today's Date: 11/12/2021   History of present illness 66 y/o F reports diagnosis of PNA on 4/16 Admitted 4/17 to [facility], ICU with acute hypoxic/hypercarbic respiratory failure, failed BiPAP and required mechanical ventilation, [facility] 4[facility]21 due to new finding of LV dysfunction Echo 4/20 with EF 40% , akinesis of apex and septal wall. . Failed weaning attempt 4/28. Trach placed on 5/1. PMH significant for COPD, tobacco use, HLD, GRED, and uncontrolled anxiety.   OT comments  Pt progressing towards goals, with increased nausea this session. Pt mod A +2 for bed mobility and transfers, max A for pericare in standing and at bed level this session. Pt able to complete multiple standing attempts this session in [place], tachycardic with standing in [place] for ~10 mins and requesting to return to bed. Pt expressing frustration with therapy not coming at scheduled times and not "sticking to a plan" during session, due to therapist [facility] when pt stated need to use the bathroom. Empathized with pt and discussed importance of mobility to achieve future goals set by pt, including getting to couch in room. Pt presenting with impairments listed below, will follow acutely. Continue to recommend AIR at d/c.   Recommendations for follow up therapy are one component of a multi-disciplinary discharge planning process, led by the attending physician.  Recommendations may be updated based on patient status, additional functional criteria and insurance authorization.    Follow Up Recommendations  Acute inpatient rehab (3hours/day)    Assistance Recommended at Discharge Frequent or constant Supervision/Assistance  Patient can return home with the following  Two people to help with walking and/or transfers;Two people to help with bathing/dressing/bathroom;Assistance with  cooking/housework;Assistance with feeding;Direct supervision/assist for medications management;Direct supervision/assist for financial management;Assist for transportation;Help with stairs or ramp for entrance   Equipment Recommendations  None recommended by OT;Other (comment) (TBD)    Recommendations for Other Services Rehab consult    Precautions / Restrictions Precautions Precautions: Fall Precaution Comments: trach Restrictions Weight Bearing Restrictions: No       Mobility Bed Mobility Overal bed mobility: Needs Assistance Bed Mobility: Rolling, Sidelying to Sit, Sit to Sidelying Rolling: Mod assist Sidelying to sit: Mod assist, +2 for physical assistance     Sit to sidelying: Mod assist, +2 for physical assistance      Transfers Overall transfer level: Needs assistance Equipment used: Ambulation equipment used Transfers: Sit to/from Stand, Bed to chair/wheelchair/BSC Sit to Stand: From elevated surface, Mod assist, +2 physical assistance           General transfer comment: multiple sit to stands in stedy for pericare Transfer via Lift Equipment: Stedy   Balance Overall balance assessment: Needs assistance Sitting-balance support: Feet supported Sitting balance-Leahy Scale: Fair Sitting balance - Comments: able to sit unsupported with feet on floor and UE support.   Standing balance support: Bilateral upper extremity supported, During functional activity Standing balance-Leahy Scale: Zero                             ADL either performed or assessed with clinical judgement   ADL Overall ADL's : Needs assistance/impaired                         Toilet Transfer: Moderate assistance;+2 for physical assistance Toilet Transfer Details (indicate cue type and reason):  with Antony Salmon Toileting- Clothing Manipulation and Hygiene: Total assistance;Sit to/from stand;Sitting/lateral lean;Bed level Toileting - Clothing Manipulation Details (indicate  cue type and reason): for pericare     Functional mobility during ADLs: Moderate assistance;+2 for physical assistance      Extremity/Trunk Assessment Upper Extremity Assessment Upper Extremity Assessment: Generalized weakness (able to grasp bedrails/stedy during transfers)   Lower Extremity Assessment Lower Extremity Assessment: Defer to PT evaluation        Vision   Additional Comments: will further assess   Perception Perception Perception: Not tested   Praxis Praxis Praxis: Not tested    Cognition Arousal/Alertness: Lethargic Behavior During Therapy: Flat affect, Agitated Overall Cognitive Status: Impaired/Different from baseline Area of Impairment: Safety/judgement, Awareness, Problem solving                   Current Attention Level: Selective   Following Commands: Follows one step commands consistently, Follows one step commands with increased time Safety/Judgement: Decreased awareness of safety, Decreased awareness of deficits Awareness: Emergent Problem Solving: Slow processing, Requires verbal cues, Requires tactile cues          Exercises      Shoulder Instructions       General Comments SpO2 97% during session on 40% FiO2 and 8L , tachycardic with standing    Pertinent Vitals/ Pain       Pain Assessment Pain Assessment: Faces Pain Score: 4  Faces Pain Scale: Hurts little more Pain Location: abdomen discomfort, nausea Pain Descriptors / Indicators: Discomfort Pain Intervention(s): Limited activity within patient's tolerance, Monitored during session  Home Living                                          Prior Functioning/Environment              Frequency  Min 2X/week        Progress Toward Goals  OT Goals(current goals can now be found in the care plan section)  Progress towards OT goals: Progressing toward goals  Acute Rehab OT Goals Patient Stated Goal: to get better OT Goal Formulation: With  patient Time For Goal Achievement: 11/19/21 Potential to Achieve Goals: Good ADL Goals Additional ADL Goal #1: Pt will perform bed mobility with Min A in preparation for ADLs Additional ADL Goal #2: Pt will locate three grooming items with 2-3 cues Additional ADL Goal #3: Pt will perform AAROM of BUEs exercises with Mod A Additional ADL Goal #4: Pt will hold and utilize tooth brush sponge to sucessfully brush her teeth and mouth with min A.  Plan Discharge plan remains appropriate;Frequency remains appropriate    Co-evaluation    PT/OT/SLP Co-Evaluation/Treatment: Yes Reason for Co-Treatment: Complexity of the patient's impairments (multi-system involvement);For patient/therapist safety;To address functional/ADL transfers   OT goals addressed during session: ADL's and self-care      AM-PAC OT "6 Clicks" Daily Activity     Outcome Measure   Help from another person eating meals?: A Little Help from another person taking care of personal grooming?: A Lot Help from another person toileting, which includes using toliet, bedpan, or urinal?: A Lot Help from another person bathing (including washing, rinsing, drying)?: A Lot Help from another person to put on and taking off regular upper body clothing?: A Lot Help from another person to put on and taking off regular lower body clothing?: A Lot 6 Click Score: 13  End of Session Equipment Utilized During Treatment: Oxygen;Gait belt;Other (comment) (stedy)  OT Visit Diagnosis: Unsteadiness on feet (R26.81);Other abnormalities of gait and mobility (R26.89);Muscle weakness (generalized) (M62.81)   Activity Tolerance Patient limited by fatigue   Patient Left in bed;with call bell/phone within reach;with nursing/sitter in room   Nurse Communication Mobility status;Other (comment) (pt needing new purewick)        Time: 3354-5625 OT Time Calculation (min): 57 min  Charges: OT General Charges $OT Visit: 1 Visit OT Treatments $Self  Care/Home Management : 23-37 mins  Alfonzo Beers, OTD, OTR/L Acute Rehab [phone]) [phone]   Mayer Masker 11/12/2021, 1:59 PM

## 2021-11-12 NOTE — TOC Progression Note (Addendum)
Transition of Care [facility]) - Progression Note    Patient Details  Name: [name] MRN: [id number] Date of Birth: 1955-08-12  Transition of Care [facility]) CM/SW [place], RN Phone Number: 11/12/2021, 8:53 AM  Clinical Narrative:    CM met with the patient at the bedside to assess the patient and discuss patient's likely transfer to CIR.  I placed the [facility] valve so that the patient could converse regarding her progress over the weekend and the patient states that she feels much better and looks forward to physical rehabilitation.  She remains on 10L/min trach collar at 98% pulse oximeter during this conversation and remained free from signs of respiratory distress or desaturations in oxygen.  Dr. Carlis Abbott with pulmonary medicine will assess the patient this morning in regards to respiratory orders and needs to progress to CIR in the next 1-2 days.  She will need to remain on 8L/min trach collar before able to transition to CIR.  The patient is likely out of the [place] window for placement since the patient was denied recently for admission to [place] on 10/18/2021.  The patient is requiring too much oxygen at this point to seek placement at a SNF facility.  Respiratory documentation and assessment will need to reflect less 8L/min trach collar before able to transition safely to CIR.  I spoke with Joycelyn Das, RN at the bedside and updated her regarding need for oxygen titration and respiratory assessments on trach collar.  Elnoria Howard, RN states that she will speak with Dr. Carlis Abbott this morning along with Rolley Sims, RT to discuss weaning oxygen as needed and reflect in charting so that patient can be safely progressed to the most appropriate level of care for rehabilitation - preferably CIR at this point.  CM and MSW with DTP Team will continue to follow the patient for CIR admission.    Expected Discharge Plan: IP Rehab Facility Barriers to Discharge: Continued Medical Work  up  Expected Discharge Plan and Services Expected Discharge Plan: [place] In-house Referral: Clinical Social Work Discharge Planning Services: CM Consult Post Acute Care Choice: IP Rehab Living arrangements for the past 2 months: Single Family Home                                       Social Determinants of Health (SDOH) Interventions    Readmission Risk Interventions    10/26/2021    5:04 PM 10/26/2021    5:02 PM  Readmission Risk Prevention Plan  Medication Review (RN Care Manager)  Complete  PCP or Specialist appointment within 3-5 days of discharge Complete   Skilled [place] Complete

## 2021-11-12 NOTE — Progress Notes (Addendum)
MD Danford took patient off continuous pulse ox. Stated he would speak more with Dr Merrily Pew about weaning patients oxygen. Patient is currently in no distress on 10L 40% oxygen. RT will continue to monitor.

## 2021-11-12 NOTE — Progress Notes (Signed)
Inpatient Rehab Admissions Coordinator:   I do not have a bed for this Pt. On CIR today. She continues to require TC at 60% and is not yet medically ready for CIR.CIR is able to accept Pt.'s on up to 35%. I have reached out to pulmonary and RT and plan is to try and titrate down today. I will follow for potential CIR admit once medically ready.  Megan Salon, MS, CCC-SLP Rehab Admissions Coordinator  [phone] (celll) [phone] (office)

## 2021-11-12 NOTE — Progress Notes (Signed)
Progress Note   Patient: [name] WIO:[id number] DOB: 1956-05-28 DOA: 09/24/2021     48 DOS: the patient was seen and examined on 11/12/2021 at 11:05AM      Brief hospital course: [name] is a 66 y.o. F with COPD, anxiety who presented with difficulty breathing, wheezing and cough for 1 week.    In the ER, SpO2 80s, in distress.  Placed on BiPAP.  STarted on treatment for COPD.     4/17: Admitted to [facility] and started on treatment 4/18: ABG 7.09/105/232/31 on BiPAP, intubated  4/20: Echo showed reduced EF 4/21: Transferred to [facility] from [facility] for Cardiology evaluation 4/29: New fever 5/1: [name] placed 5/11: Advanced to regular diet but low oral intake noted 5/15: Cuffless trach, transferred OOU 5/17: Tracheal aspirate showed Klebsiella 5/23: Developed abdominal pain, distension, CT showed ?SBO, Gen Surg consulted 5/25: Symptoms resolved with conservative measures 5/26: Gen Surg signed off 5/30: Abdominal distension recurred, radiograph showing distended bowel, Gen Surg reconsulted, suspected ileus 6/1: Abdominal symptoms mostly resolved     Assessment and Plan: * Acute respiratory failure with hypoxia and hypercapnia (HCC) Acute failure resolved.  See below re: chronic failure.   Chronic respiratory failure, tracheostomy dependence and chronic COPD Baseline FEV1 32% in 2021.  Here, RT having difficulty with weaning.   - Consult Pulmonology - Continue LABA, ICS, LAMA - Continue SABA PRN - Continue Singulair, PPI    Throat pain-resolved as of 11/11/2021 Has some ulcers in back of throat, whitish without surrounding redness, doubt candida.  Suspect her throat pain and swallowing difficulty are from this. - Trial viscous lidocaine  Asymptomatic bacteriuria Urine culture 5/29 with Klebsiella.  Asymptomatic, UTI ruled out at this time.    Lung nodules Noted incidentally, ID consulted.  Crypto, histo and blasto Ags negative.   - Pulm recommend CT scan of the chest  in 6 to 8 weeks to follow-up for resolution of likely infectious upper lobe nodules  Thrombocytopenia (HCC) Resolved  Hospital-acquired pneumonia Resolved  Hyperlipidemia - Continue Crestor   Essential hypertension BP soft - Continue metoprolol  Pressure injury of skin Stage 2, coccyx, not present on admission  Acute metabolic encephalopathy Resolved  Protein-calorie malnutrition, severe - Continue nutritional supplements - Consult dietitian    Acute on chronic systolic CHF (congestive heart failure) (HCC) 4/20 Echo--EF 40%, AK apex and anteroseptal, G1DD BP soft precludes ARB, spironolactone - Continue metoprolol  Hypernatremia Resolved  Lactic acidosis Driven primarily from her hypoxia and respiratory failure Sepsis ruled out  Hyperglycemia - secondary to the use of his steroids -No prior history of diabetes -4/18 check A1c--5.4 Add novolog q 4 hours  GERD (gastroesophageal reflux disease) - Continue PPI  Anxiety - Continue clonazepam and Seroquel  Hypokalemia Resolved  Ileus (HCC) Initially developed abdominal pain, distension ~5/23.  CT showed dilated sm bowel, possible partial SBO, Gen surgery consulted, symptoms resolved with suppositories and increased bowel regimen, Gen Surg signed off.  Reconsulted 5/30 due to again decreased bowel function, radiograph showing distended bowel again, and again resolved spontanseously.    SBO ruled out.  General Surgery calling this an ileus.  Regardless, it is resolved again. - Avoid hypokalemia, hypocalcemia, hypomagnesemia - Continue bowel regimen   COPD exacerbation (HCC) Resolved           Subjective: Still with intermittent nausea, still with poor p.o. intake.  Abdominal bloating is mild, she is having bowel movements.  No confusion, no fever, no dyspnea, no respiratory distress, no cough,  no sputum     Physical Exam: Vitals:   11/12/21 0515 11/12/21 0820 11/12/21 0831 11/12/21 1131  BP:  119/65  115/69   Pulse: (!) 108 97 97 93  Resp: 18 18 18 18   Temp: 98.2 F (36.8 C)  (!) 97.5 F (36.4 C)   TempSrc: Oral  Oral   SpO2: (!) 86% 93% 98% 93%  Weight:      Height:       Thin elderly adult female, lying in bed, appears weak and debilitated Respiratory rate seems normal, she does not appear breathless, she is not wheezing and has no rales on exam, she is somewhat diminished at the periphery Tachycardic, regular, no murmurs Abdomen soft, no tenderness palpation, mildly distended but not that bad Attention normal, affect appropriate, judgment insight appear normal and calm  Data Reviewed: Discussed with pulmonology, nursing notes reviewed, vital signs reviewed   Family Communication: Sister at the bedside    Disposition: Status is: Inpatient         Author: Edwin Dada, MD 11/12/2021 2:56 PM  For on call review [email or web address]

## 2021-11-12 NOTE — Progress Notes (Signed)
Physical Therapy Treatment Patient Details Name: [name] MRN: [id number] DOB: 05-Jun-1956 Today's Date: 11/12/2021   History of Present Illness 66 y/o F reports diagnosis of PNA on 4/16 Admitted 4/17 to [facility], ICU with acute hypoxic/hypercarbic respiratory failure, failed BiPAP and required mechanical ventilation, transferred to [facility] 4/21 due to new finding of LV dysfunction Echo 4/20 with EF 40% , akinesis of apex and septal wall. . Failed weaning attempt 4/28. Trach placed on 5/1. PMH significant for COPD, tobacco use, HLD, GRED, and uncontrolled anxiety.    PT Comments    Continuing work on functional mobility and activity tolerance;  Pt progressing towards goals, even with increased nausea this session. Pt mod A +2 for bed mobility and transfers, max A for pericare in standing and at bed level this session. Pt able to complete multiple standing attempts this session in Stedy, tachycardic with semi-standing (fully supported including knees blocked in [place]) for ~10 mins and requesting to return to bed. Pt expressing frustration with therapy not coming at scheduled times and not "sticking to a plan" during session, due to therapist attempting to get pt to [facility] vs chair when pt stated need to move bowels. Empathized with pt and discussed importance of mobility to achieve future goals set by pt, including getting to couch in room. Pt presenting with impairments listed below, will follow acutely. Noteworthy that pt tolerated more upright activity today at 40% O2; Will plan to try 35% next session; Continue to recommend AIR at d/c.   Recommendations for follow up therapy are one component of a multi-disciplinary discharge planning process, led by the attending physician.  Recommendations may be updated based on patient status, additional functional criteria and insurance authorization.  Follow Up Recommendations  Acute inpatient rehab (3hours/day)     Assistance Recommended at Discharge  Frequent or constant Supervision/Assistance  Patient can return home with the following A lot of help with walking and/or transfers;A lot of help with bathing/dressing/bathroom;Help with stairs or ramp for entrance;Assist for transportation;Assistance with cooking/housework   Equipment Recommendations  BSC/3in1;Wheelchair (measurements PT);Wheelchair cushion (measurements PT);Other (comment);Hospital bed    Recommendations for Other Services       Precautions / Restrictions Precautions Precautions: Fall Precaution Comments: trach Restrictions Weight Bearing Restrictions: No     Mobility  Bed Mobility Overal bed mobility: Needs Assistance Bed Mobility: Rolling, Sidelying to Sit, Sit to Sidelying Rolling: Mod assist Sidelying to sit: Mod assist, +2 for physical assistance     Sit to sidelying: Mod assist, +2 for physical assistance General bed mobility comments: Multimodal cues for technique and positioning for rolling and coming to sit; started with rolling for pericare/cleanup after bowels moved; tehn assist pt up to sitting EOB via log roll    Transfers Overall transfer level: Needs assistance Equipment used: Ambulation equipment used Transfers: Sit to/from Stand, Bed to chair/wheelchair/BSC Sit to Stand: From elevated surface, Mod assist, +2 physical assistance           General transfer comment: Stood to stedy from bed, then multiple sit to stands in stedy for [employer] via Lift Equipment: Stedy  Ambulation/Gait                   Stairs             Wheelchair Mobility    Modified Rankin (Stroke Patients Only)       Balance     Sitting balance-Leahy Scale: Fair     Standing balance support: Bilateral upper  extremity supported, During functional activity Standing balance-Leahy Scale: Zero                              Cognition Arousal/Alertness: Awake/alert Behavior During Therapy: Flat affect, Agitated,  Anxious Overall Cognitive Status: Impaired/Different from baseline Area of Impairment: Safety/judgement, Awareness, Problem solving                   Current Attention Level: Selective   Following Commands: Follows one step commands consistently, Follows one step commands with increased time Safety/Judgement: Decreased awareness of safety, Decreased awareness of deficits Awareness: Emergent Problem Solving: Slow processing, Requires verbal cues, Requires tactile cues          Exercises      General Comments General comments (skin integrity, edema, etc.): SpO2 97% during session on 40% FiO2 and 8L , tachycardic with standing      Pertinent Vitals/Pain Pain Assessment Pain Assessment: Faces Faces Pain Scale: Hurts little more Pain Location: abdomen discomfort, nausea Pain Descriptors / Indicators: Discomfort Pain Intervention(s): Limited activity within patient's tolerance    Home Living                          Prior Function            PT Goals (current goals can now be found in the care plan section) Acute Rehab PT Goals Patient Stated Goal: Did not state PT Goal Formulation: With patient/family Time For Goal Achievement: 11/20/21 Potential to Achieve Goals: Fair Progress towards PT goals: Progressing toward goals    Frequency    Min 3X/week      PT Plan Current plan remains appropriate    Co-evaluation PT/OT/SLP Co-Evaluation/Treatment: Yes Reason for Co-Treatment: Complexity of the patient's impairments (multi-system involvement);For patient/therapist safety;To address functional/ADL transfers PT goals addressed during session: Mobility/safety with mobility OT goals addressed during session: ADL's and self-care      AM-PAC PT "6 Clicks" Mobility   Outcome Measure  Help needed turning from your back to your side while in a flat bed without using bedrails?: A Little Help needed moving from lying on your back to sitting on the side  of a flat bed without using bedrails?: A Lot Help needed moving to and from a bed to a chair (including a wheelchair)?: Total Help needed standing up from a chair using your arms (e.g., wheelchair or bedside chair)?: Total Help needed to walk in hospital room?: Total Help needed climbing 3-5 steps with a railing? : Total 6 Click Score: 9    End of Session Equipment Utilized During Treatment: Gait belt;Oxygen Activity Tolerance: Patient tolerated treatment well Patient left: in bed;with call bell/phone within reach Nurse Communication: Mobility status PT Visit Diagnosis: Other abnormalities of gait and mobility (R26.89);Muscle weakness (generalized) (M62.81) Pain - part of body:  (generalized)     Time: [id number] PT Time Calculation (min) (ACUTE ONLY): 60 min  Charges:  $Therapeutic Activity: 23-37 mins                     Roney Marion, PT  Acute Rehabilitation Services Office [phone]    Colletta [place] 11/12/2021, 2:55 PM

## 2021-11-13 ENCOUNTER — Encounter (HOSPITAL_COMMUNITY): Payer: Medicare Other

## 2021-11-13 NOTE — Progress Notes (Signed)
Inpatient Rehab Admissions Coordinator:    I do not have a bed for this Pt. On CIR today, but note that she is doing well on 35% via trach collar. I am hopeful that we will have a bed for her tomorrow.   Clemens Catholic, [place], [place] Admissions Coordinator  [phone] ([place]) [phone] (office)

## 2021-11-13 NOTE — Progress Notes (Signed)
Speech Language Pathology Treatment: [name] Speaking valve  Patient Details Name: [name] MRN: [id number] DOB: [birth year] Today's Date: 11/13/2021 Time: 1710-1735 SLP Time Calculation (min) (ACUTE ONLY): 25 min  Assessment / Plan / Recommendation Clinical Impression  Patient seen by SLP for skilled treatment focused on PMV usage and education. When SLP entered room, patient lying in bed with PMV on, oxygen saturations 92%. Patient reported that she has slept most of the day today. When asked why her PMV was still on when she was sleeping she told SLP that she forgot to have it taken off and also that she slept with it on all last night. Her voice was clear, strong and patient had adequate breath support for continuous phonation at conversational level. SLP demonstrated how to don and doff PMV using reverse camera on phone as mirror but patient reporting that she didn't feel comfortable doing that on her own. Recommending continued education and training with patient on donning and doffing of PMV. SLP updated patient's PMV and swallow safety signs in room and posted at [facility] and will continue to follow patient acutely as she waits for bed in AIR.   HPI HPI: Pt is a 66 yo female presenting to [facility] with acute hypoxic/hypercarbic respiratory failure requiring intubation 4/19 followed by transfer to [facility] after new finding of LV dysfunction. Trach 5/1. PMH includes: COPD, previously on home O2 but "took herself off," former smoker (quit 2021), uncontrolled anxiety      SLP Plan  Continue with current plan of care      Recommendations for follow up therapy are one component of a multi-disciplinary discharge planning process, led by the attending physician.  Recommendations may be updated based on patient status, additional functional criteria and insurance authorization.    Recommendations         Patient may use Passy-Muir Speech Valve: During all waking hours (remove during sleep) PMSV  Supervision: Intermittent MD: Please consider changing trach tube to : Smaller size         Oral Care Recommendations: Oral care BID Follow Up Recommendations: Acute inpatient rehab (3hours/day) Assistance recommended at discharge: Frequent or constant Supervision/Assistance SLP Visit Diagnosis: Aphonia (R49.1) Plan: Continue with current plan of care           Sonia Baller, MA, CCC-SLP Speech Therapy

## 2021-11-13 NOTE — Progress Notes (Signed)
Report given to 2w RN

## 2021-11-13 NOTE — H&P (Incomplete)
Physical Medicine and Rehabilitation Admission H&P    Chief Complaint  Patient presents with   Shortness of Breath    HPI: ***   ROS   Past Medical History:  Diagnosis Date   Anxiety    Chronic back pain    COPD (chronic obstructive pulmonary disease) (HCC)    Tobacco abuse     Past Surgical History:  Procedure Laterality Date   ABDOMINAL HYSTERECTOMY     BACK SURGERY     ILEO LOOP NEOBLADDER     VESICOVAGINAL FISTULA REPAIR      Family History  Problem Relation Age of Onset   Atrial fibrillation Sister     Social History:  reports that she quit smoking about 2 years ago. Her smoking use included cigarettes. She has a 90.00 pack-year smoking history. She has never used smokeless tobacco. She reports that she does not drink alcohol and does not use drugs.   Allergies: No Known Allergies   Facility-Administered Medications Prior to Admission  Medication Dose Route Frequency Provider Last Rate Last Admin   budesonide (PULMICORT) nebulizer solution 0.25 mg  0.25 mg Nebulization BID Leslye Peer, MD       Medications Prior to Admission  Medication Sig Dispense Refill   albuterol (VENTOLIN HFA) 108 (90 Base) MCG/ACT inhaler INHALE 2 PUFFS INTO THE LUNGS EVERY 6 HOURS AS NEEDED FOR WHEEZE/SHORTNESS OF BREATH 36 each 11   ALPRAZolam (XANAX) 1 MG tablet Take 0.5-1 mg by mouth daily as needed for sleep or anxiety.     Budeson-Glycopyrrol-Formoterol (BREZTRI AEROSPHERE) 160-9-4.8 MCG/ACT AERO Inhale 2 puffs into the lungs 2 (two) times daily. (Patient taking differently: Inhale 2 puffs into the lungs 2 (two) times daily. Before lunch) 10.7 g 5   Budeson-Glycopyrrol-Formoterol (BREZTRI AEROSPHERE) 160-9-4.8 MCG/ACT AERO Inhale 2 puffs into the lungs in the morning and at bedtime. 5.9 g 0   busPIRone (BUSPAR) 10 MG tablet Take 10 mg by mouth 3 (three) times daily.     cefTRIAXone (ROCEPHIN) 1 g injection ceftriaxone 1 gram solution for injection  Take 1 g every day by  injection route as directed for 1 day.     docusate sodium (COLACE) 100 MG capsule Take 1 capsule (100 mg total) by mouth 2 (two) times daily. (Patient taking differently: Take 100 mg by mouth 2 (two) times daily as needed (constipation).) 10 capsule 0   doxycycline (VIBRAMYCIN) 100 MG capsule Take 100 mg by mouth 2 (two) times daily. For 7 days started on 09/23/21     fluticasone (FLONASE) 50 MCG/ACT nasal spray Place 1 spray into both nostrils daily as needed for allergies.     ibuprofen (ADVIL) 200 MG tablet Take 400 mg by mouth every 8 (eight) hours as needed (pain.).     ipratropium (ATROVENT HFA) 17 MCG/ACT inhaler Inhale 2 puffs into the lungs every 4 (four) hours as needed for wheezing.     loratadine (CLARITIN) 10 MG tablet Take 10 mg by mouth daily as needed for allergies.     Magnesium 250 MG TABS Take 500 mg by mouth in the morning.     montelukast (SINGULAIR) 10 MG tablet Take 10 mg by mouth in the morning.     omeprazole (PRILOSEC) 20 MG capsule Take 20 mg by mouth daily.     predniSONE (STERAPRED UNI-PAK 21 TAB) 10 MG (21) TBPK tablet Take 10 mg by mouth See admin instructions. 6,5,4,3,2,1     revefenacin (YUPELRI) 175 MCG/3ML nebulizer solution Take 3  mLs (175 mcg total) by nebulization daily. 90 mL 3   tretinoin (RETIN-A) 0.1 % cream Apply topically at bedtime.     triamcinolone acetonide (TRIESENCE) 40 MG/ML SUSP triamcinolone acetonide 40 mg/mL suspension for injection  Take 80 mg every day by injection route for 1 day.     arformoterol (BROVANA) 15 MCG/2ML NEBU Take 2 mLs (15 mcg total) by nebulization 2 (two) times daily. (Patient not taking: Reported on 09/24/2021) 120 mL 6   rosuvastatin (CRESTOR) 5 MG tablet Take 1 tablet (5 mg total) by mouth daily. (Patient not taking: Reported on 09/24/2021) 90 tablet 3   SPIRIVA HANDIHALER 18 MCG inhalation capsule INHALE 1 CAPSULE VIA HANDIHALER ONCE DAILY AT THE SAME TIME EVERY DAY (Patient not taking: Reported on 09/24/2021) 30 capsule 6       Home: Home Living Family/patient expects to be discharged to:: Private residence Living Arrangements: Spouse/significant other Available Help at Discharge: Family, Available 24 hours/day Type of Home: House Home Access: Stairs to enter Entergy CorporationEntrance Stairs-Number of Steps: 3 in front, 1 in garage Entrance Stairs-Rails: None Home Layout: One level Bathroom Shower/Tub: Psychologist, counsellingWalk-in shower, Engineer, manufacturing systemsTub/shower unit Bathroom Toilet: Handicapped height (sister was unsure) Nurse, learning disabilityBathroom Accessibility: Yes Home Equipment: Information systems managerhower seat - built in  Lives With: Significant other   Functional History: Prior Function Prior Level of Function : Independent/Modified Independent  Functional Status:  Mobility: Bed Mobility Overal bed mobility: Needs Assistance Bed Mobility: Rolling, Sidelying to Sit, Sit to Sidelying Rolling: Mod assist Sidelying to sit: Mod assist, +2 for physical assistance Supine to sit: Mod assist, HOB elevated Sit to supine: Mod assist Sit to sidelying: Mod assist, +2 for physical assistance General bed mobility comments: Multimodal cues for technique and positioning for rolling and coming to sit; started with rolling for pericare/cleanup after bowels moved; tehn assist pt up to sitting EOB via log roll Transfers Overall transfer level: Needs assistance Equipment used: Ambulation equipment used Transfers: Sit to/from Stand, Bed to chair/wheelchair/BSC Sit to Stand: From elevated surface, Mod assist, +2 physical assistance Bed to/from chair/wheelchair/BSC transfer type:: Via Lift equipment Stand pivot transfers: Max assist, +2 safety/equipment Squat pivot transfers: Max assist  Lateral/Scoot Transfers: Total assist, +2 physical assistance Transfer via Lift Equipment: Stedy General transfer comment: Stood to stedy from bed, then multiple sit to stands in stedy for [employer] Ambulation/Gait General Gait Details: unable    ADL: ADL Overall ADL's : Needs  assistance/impaired Eating/Feeding: Set up, Sitting Eating/Feeding Details (indicate cue type and reason): able to hold cup and drink Grooming: Wash/dry hands, Wash/dry face, Set up, Bed level Grooming Details (indicate cue type and reason): Refused to sit up thissession due to nausea and decreased sats Lower Body Bathing: Total assistance, Bed level Lower Body Bathing Details (indicate cue type and reason): unable to assist due to fatigue, assisted with rolling in bed Upper Body Dressing : Maximal assistance, Sitting Upper Body Dressing Details (indicate cue type and reason): Pt attempting to doff and don hospital gown, increased weakness making it difficult for her to raise her arms to shoulder level to pull the sleeves up or down. Lower Body Dressing: Maximal assistance, Bed level Lower Body Dressing Details (indicate cue type and reason): Figure four to don socks. Max A to initate and maintain position Toilet Transfer: Moderate assistance, +2 for physical assistance Toilet Transfer Details (indicate cue type and reason): with Stedy Toileting- Clothing Manipulation and Hygiene: Total assistance, Sit to/from stand, Sitting/lateral lean, Bed level Toileting - Clothing Manipulation Details (indicate cue type and reason):  for pericare Functional mobility during ADLs: Moderate assistance, +2 for physical assistance General ADL Comments: Pt remained bed level this session  Cognition: Cognition Overall Cognitive Status: Impaired/Different from baseline Orientation Level: Oriented X4 Cognition Arousal/Alertness: Awake/alert Behavior During Therapy: Flat affect, Agitated, Anxious Overall Cognitive Status: Impaired/Different from baseline Area of Impairment: Safety/judgement, Awareness, Problem solving Current Attention Level: Selective Following Commands: Follows one step commands consistently, Follows one step commands with increased time Safety/Judgement: Decreased awareness of safety,  Decreased awareness of deficits Awareness: Emergent Problem Solving: Slow processing, Requires verbal cues, Requires tactile cues General Comments: Requiring increased time and cuing Difficult to assess due to: Tracheostomy (no PMV present in room during session)    Blood pressure 118/69, pulse (!) 118, temperature 98.1 F (36.7 C), temperature source Oral, resp. rate 18, height 5\' 1"  (1.549 m), weight 51.4 kg, SpO2 93 %. Physical Exam  No results found for this or any previous visit (from the past 48 hour(s)). No results found.    Blood pressure 118/69, pulse (!) 118, temperature 98.1 F (36.7 C), temperature source Oral, resp. rate 18, height 5\' 1"  (1.549 m), weight 51.4 kg, SpO2 93 %.  Medical Problem List and Plan: 1. Functional deficits secondary to ***  -patient may *** shower  -ELOS/Goals: *** 2.  Antithrombotics: -DVT/anticoagulation:  {VTE PROPHYLAXIS/ANTICOAGULATION -  -antiplatelet therapy: *** 3. Pain Management: *** 4. Mood: ***  -antipsychotic agents: *** 5. Neuropsych: This patient *** capable of making decisions on *** own behalf. 6. Skin/Wound Care: *** 7. Fluids/Electrolytes/Nutrition: ***     ***  , PA-C 11/13/2021

## 2021-11-13 NOTE — Progress Notes (Signed)
   11/13/21 1250  Assess: MEWS Score  Temp 97.8 F (36.6 C)  BP 131/67  MAP (mmHg) 85  Pulse Rate (!) 120  Resp 20  SpO2 (!) 88 %  O2 Device Tracheostomy Collar  O2 Flow Rate (L/min) 8 L/min  FiO2 (%) 35 %  Assess: MEWS Score  MEWS Temp 0  MEWS Systolic 0  MEWS Pulse 2  MEWS RR 0  MEWS LOC 0  MEWS Score 2  MEWS Score Color Yellow  Assess: if the MEWS score is Yellow or Red  Were vital signs taken at a resting state? Yes  Focused Assessment No change from prior assessment  Does the patient meet 2 or more of the SIRS criteria? No  Does the patient have a confirmed or suspected source of infection? No  MEWS guidelines implemented *See Row Information* Yes  Take Vital Signs  Increase Vital Sign Frequency  Yellow: Q 2hr X 2 then Q 4hr X 2, if remains yellow, continue Q 4hrs  Escalate  MEWS: Escalate Yellow: discuss with charge nurse/RN and consider discussing with provider and RRT  Notify: Charge Nurse/RN  Name of Charge Nurse/RN Notified Remo Lipps, RN  Date Charge Nurse/RN Notified 11/13/21  Time Charge Nurse/RN Notified 1300  Document  Patient Outcome Other (Comment) (Will continue to monitor)  Assess: SIRS CRITERIA  SIRS Temperature  0  SIRS Pulse 1  SIRS Respirations  0  SIRS WBC 0  SIRS Score Sum  1

## 2021-11-13 NOTE — Plan of Care (Signed)
  Problem: Education: Goal: Knowledge of disease or condition will improve Outcome: Not Progressing   Problem: Activity: Goal: Ability to tolerate increased activity will improve Outcome: Not Progressing   Problem: Activity: Goal: Will verbalize the importance of balancing activity with adequate rest periods Outcome: Not Progressing   Problem: Respiratory: Goal: Ability to maintain a clear airway will improve Outcome: Not Progressing   Problem: Respiratory: Goal: Levels of oxygenation will improve Outcome: Not Progressing   Problem: Respiratory: Goal: Ability to maintain adequate ventilation will improve Outcome: Not Progressing

## 2021-11-13 NOTE — Progress Notes (Addendum)
Progress Note   Patient: [name] DOB: 1955/08/12 DOA: 09/24/2021     49 DOS: the patient was seen and examined on 11/13/2021 at 11:35AM      Brief Mckenzie course: [name] is a 66 y.o. F with COPD, anxiety who presented with difficulty breathing, wheezing and cough for 1 week.    In the ER, SpO2 80s, in distress.  Placed on BiPAP.  STarted on treatment for COPD.     4/17: Admitted to [name] and started on treatment 4/18: ABG 7.09/105/232/31 on BiPAP, intubated  4/20: Echo showed reduced EF 4/21: Transferred to [name] from [name] for Cardiology evaluation 4/29: New fever 5/1: [name] placed 5/11: Advanced to regular diet but low oral intake noted 5/15: Cuffless trach, transferred [name] 5/17: Tracheal aspirate showed Klebsiella 5/23: Developed abdominal pain, distension, CT showed ?SBO, Gen Surg consulted 5/25: Symptoms resolved with conservative measures 5/26: Gen Surg signed off 5/30: Abdominal distension recurred, radiograph showing distended bowel, Gen Surg reconsulted, suspected ileus 6/1: Abdominal symptoms mostly resolved 6/2-present: Stable pending placement     Assessment and Plan: * Acute respiratory failure with hypoxia and hypercapnia ([name]) Presented with SpO2 80s, in distress requiring BiPAP due to COPD flare.  Subsequently intubated, difficult to wean, required tracheostomy, but acute failure has resolved.  See below re: chronic failure.   Chronic respiratory failure, tracheostomy dependence and chronic COPD Baseline FEV1 32% in 2021.  Patient's respiratory status has been stable for weeks.  Previous notes which indicate "trouble weaning the patient" reflect inertia on the part of respiratory therapy to proceed with weaning due to perceived dyspnea (likely driven more by anxiety) rather than any objective evidence of low oxygen.  In the last 24 hours, we have had no difficulty placing the patient on 8L with 35% O2 and her SpO2 is at 97%, which is  higher than ideal for her lungs.  - Consult Pulmonology - Continue LABA, ICS, LAMA - Continue SABA PRN - Continue Singulair, PPI - Avoid continuous pulse ox   Throat pain-resolved as of 11/11/2021 Has some ulcers in back of throat, whitish without surrounding redness, doubt candida.  Suspect her throat pain and swallowing difficulty are from this. - Trial viscous lidocaine  Asymptomatic bacteriuria Urine culture 5/29 with Klebsiella.  Asymptomatic, UTI ruled out at this time.    Lung nodules Noted incidentally, ID consulted.  Crypto, histo and blasto Ags negative.   - Pulm recommend CT scan of the chest in 6 to 8 weeks to follow-up for resolution of likely infectious upper lobe nodules  Thrombocytopenia ([name]) Resolved  Mckenzie-acquired pneumonia In mid-May, patient developed symptoms of pneumonia.  Tracheal aspiration growing Klebsiella.  Completed antibiotic course and resolved    Hyperlipidemia - Continue Crestor   Essential hypertension BP soft - Continue metoprolol  Pressure injury of skin Stage 2, coccyx, not present on admission  Acute metabolic encephalopathy Resolved  Protein-calorie malnutrition, severe - Continue nutritional supplements - Consult dietitian    Acute on chronic systolic CHF (congestive heart failure) ([name]) 4/20 Echo--EF 40%, AK apex and anteroseptal, G1DD BP soft precludes ARB, spironolactone  Earlier during hospitalization, patient did develop bilateral infiltrates, edema, was treated with Lasix, this resolved.  Now appears euvolemic - Continue metoprolol  Hypernatremia Resolved  Lactic acidosis Driven primarily from her hypoxia and respiratory failure Sepsis ruled out  Hyperglycemia - secondary to the use of his steroids -No prior history of diabetes -4/18 check A1c--5.4 Add novolog q 4 hours  GERD (gastroesophageal reflux  disease) - Continue PPI  Anxiety - Continue clonazepam and Seroquel  Hypokalemia Resolved  Ileus  ([name]) Initially developed abdominal pain, distension ~5/23.  CT showed dilated sm bowel, possible partial SBO, Gen surgery consulted, symptoms resolved with suppositories and increased bowel regimen, Gen Surg signed off.  Reconsulted 5/30 due to again decreased bowel function, radiograph showing distended bowel again, and again resolved spontanseously.    SBO ruled out.  General Surgery calling this an ileus.  Regardless, it is resolved again. - Avoid hypokalemia, hypocalcemia, hypomagnesemia - Continue bowel regimen  ADDENDUM: sister brought her a cheese-steak from [place] today, and she ate most of it.  Clearly ileus is resolved   COPD exacerbation ([name]) Presenting problem.  Now resolved, see above.          Subjective: Still having intermittent nausea, but having bowel movements, no vomiting, no abdominal pain, no confusion, no fever, no respiratory distress, no cough, no sputum, no wheezing, no dyspnea     Physical Exam: Vitals:   11/13/21 0534 11/13/21 0819 11/13/21 0900 11/13/21 1144  BP: 99/60  (!) 101/58   Pulse: 94 94 98 96  Resp: 18 18 18 18   Temp: 97.8 F (36.6 C)  98.2 F (36.8 C)   TempSrc: Oral  Oral   SpO2: 97% 97% 97% 97%  Weight:      Height:       Fairly elderly adult female, appears somewhat malnourished, lying in bed, appears weak and debilitated, interactive and appropriate Respiratory rate seems normal, she has a Passy-Muir valve and is speaking without dyspnea, lung sounds diminished overall but no wheezing or rales Tachycardic, no murmurs, no peripheral edema Abdomen soft, there are still a lot of distention, but she is not tender Attention normal, affect appropriate, judgment and insight appear normal, due to severe generalized weakness  Data Reviewed: Nursing notes reviewed, vital signs reviewed  Family Communication: Will call sister later today    Disposition: Status is: Inpatient The patient was admitted for respiratory failure due  to COPD.  Because of her advanced lung disease, she was not possible to wean from the ventilator and required tracheostomy.  Her Mckenzie course has been complicated by Mckenzie-acquired pneumonia, severe debility, ileus, and poor oral intake.  Over the long period of time where nursing and respiratory therapy were reluctant to wean down the patient's oxygen although there is no objective reason to increase her oxygen supplementation.  She is stable for dicharge when appropriate disposition can be found        Author: Edwin Dada, MD 11/13/2021 11:51 AM  For on call review [email or web address].

## 2021-11-14 ENCOUNTER — Inpatient Hospital Stay (HOSPITAL_COMMUNITY)
Admission: RE | Admit: 2021-11-14 | Discharge: 2021-12-12 | DRG: 073 | Disposition: A | Discharge status: Home Health | Payer: Medicare Other | Source: Intra-hospital | Attending: Physical Medicine & Rehabilitation | Admitting: Physical Medicine & Rehabilitation

## 2021-11-14 ENCOUNTER — Encounter (HOSPITAL_COMMUNITY): Payer: Self-pay | Admitting: Critical Care Medicine

## 2021-11-14 DIAGNOSIS — G7281 Critical illness myopathy: Secondary | ICD-10-CM | POA: Diagnosis present

## 2021-11-14 DIAGNOSIS — D72829 Elevated white blood cell count, unspecified: Secondary | ICD-10-CM

## 2021-11-14 DIAGNOSIS — I429 Cardiomyopathy, unspecified: Secondary | ICD-10-CM | POA: Diagnosis not present

## 2021-11-14 DIAGNOSIS — K567 Ileus, unspecified: Secondary | ICD-10-CM | POA: Diagnosis not present

## 2021-11-14 DIAGNOSIS — M21371 Foot drop, right foot: Secondary | ICD-10-CM | POA: Diagnosis not present

## 2021-11-14 DIAGNOSIS — K560 Paralytic ileus: Secondary | ICD-10-CM | POA: Diagnosis not present

## 2021-11-14 DIAGNOSIS — R110 Nausea: Secondary | ICD-10-CM | POA: Diagnosis not present

## 2021-11-14 DIAGNOSIS — M21372 Foot drop, left foot: Secondary | ICD-10-CM | POA: Diagnosis present

## 2021-11-14 DIAGNOSIS — R911 Solitary pulmonary nodule: Secondary | ICD-10-CM | POA: Diagnosis not present

## 2021-11-14 DIAGNOSIS — L89152 Pressure ulcer of sacral region, stage 2: Secondary | ICD-10-CM | POA: Diagnosis not present

## 2021-11-14 DIAGNOSIS — M545 Low back pain, unspecified: Secondary | ICD-10-CM | POA: Diagnosis present

## 2021-11-14 DIAGNOSIS — K6389 Other specified diseases of intestine: Secondary | ICD-10-CM | POA: Diagnosis not present

## 2021-11-14 DIAGNOSIS — F419 Anxiety disorder, unspecified: Secondary | ICD-10-CM | POA: Diagnosis present

## 2021-11-14 DIAGNOSIS — D75839 Thrombocytosis, unspecified: Secondary | ICD-10-CM | POA: Diagnosis present

## 2021-11-14 DIAGNOSIS — J9601 Acute respiratory failure with hypoxia: Secondary | ICD-10-CM | POA: Diagnosis not present

## 2021-11-14 DIAGNOSIS — R918 Other nonspecific abnormal finding of lung field: Secondary | ICD-10-CM | POA: Diagnosis present

## 2021-11-14 DIAGNOSIS — G6281 Critical illness polyneuropathy: Secondary | ICD-10-CM | POA: Diagnosis not present

## 2021-11-14 DIAGNOSIS — Z981 Arthrodesis status: Secondary | ICD-10-CM

## 2021-11-14 DIAGNOSIS — Z681 Body mass index (BMI) 19 or less, adult: Secondary | ICD-10-CM | POA: Diagnosis not present

## 2021-11-14 DIAGNOSIS — E871 Hypo-osmolality and hyponatremia: Secondary | ICD-10-CM | POA: Diagnosis not present

## 2021-11-14 DIAGNOSIS — E876 Hypokalemia: Secondary | ICD-10-CM | POA: Diagnosis not present

## 2021-11-14 DIAGNOSIS — F411 Generalized anxiety disorder: Secondary | ICD-10-CM | POA: Diagnosis not present

## 2021-11-14 DIAGNOSIS — M47816 Spondylosis without myelopathy or radiculopathy, lumbar region: Secondary | ICD-10-CM | POA: Diagnosis not present

## 2021-11-14 DIAGNOSIS — E43 Unspecified severe protein-calorie malnutrition: Secondary | ICD-10-CM | POA: Diagnosis not present

## 2021-11-14 DIAGNOSIS — G8929 Other chronic pain: Secondary | ICD-10-CM | POA: Diagnosis present

## 2021-11-14 DIAGNOSIS — J449 Chronic obstructive pulmonary disease, unspecified: Secondary | ICD-10-CM | POA: Diagnosis not present

## 2021-11-14 DIAGNOSIS — F410 Panic disorder [episodic paroxysmal anxiety] without agoraphobia: Secondary | ICD-10-CM | POA: Diagnosis present

## 2021-11-14 DIAGNOSIS — N3010 Interstitial cystitis (chronic) without hematuria: Secondary | ICD-10-CM | POA: Diagnosis not present

## 2021-11-14 DIAGNOSIS — R5381 Other malaise: Secondary | ICD-10-CM

## 2021-11-14 DIAGNOSIS — Z87891 Personal history of nicotine dependence: Secondary | ICD-10-CM

## 2021-11-14 DIAGNOSIS — R140 Abdominal distension (gaseous): Secondary | ICD-10-CM | POA: Diagnosis not present

## 2021-11-14 DIAGNOSIS — Z8249 Family history of ischemic heart disease and other diseases of the circulatory system: Secondary | ICD-10-CM | POA: Diagnosis not present

## 2021-11-14 DIAGNOSIS — J441 Chronic obstructive pulmonary disease with (acute) exacerbation: Secondary | ICD-10-CM | POA: Diagnosis not present

## 2021-11-14 DIAGNOSIS — Z930 Tracheostomy status: Secondary | ICD-10-CM

## 2021-11-14 DIAGNOSIS — I10 Essential (primary) hypertension: Secondary | ICD-10-CM | POA: Diagnosis present

## 2021-11-14 DIAGNOSIS — Z79899 Other long term (current) drug therapy: Secondary | ICD-10-CM

## 2021-11-14 DIAGNOSIS — L8990 Pressure ulcer of unspecified site, unspecified stage: Secondary | ICD-10-CM | POA: Diagnosis present

## 2021-11-14 DIAGNOSIS — J439 Emphysema, unspecified: Secondary | ICD-10-CM | POA: Diagnosis not present

## 2021-11-14 DIAGNOSIS — B964 Proteus (mirabilis) (morganii) as the cause of diseases classified elsewhere: Secondary | ICD-10-CM | POA: Diagnosis present

## 2021-11-14 DIAGNOSIS — Z720 Tobacco use: Secondary | ICD-10-CM | POA: Diagnosis present

## 2021-11-14 LAB — COMPREHENSIVE METABOLIC PANEL
ALT: 61 U/L — ABNORMAL HIGH (ref 0–44)
AST: 51 U/L — ABNORMAL HIGH (ref 15–41)
Albumin: 1.9 g/dL — ABNORMAL LOW (ref 3.5–5.0)
Alkaline Phosphatase: 181 U/L — ABNORMAL HIGH (ref 38–126)
Anion gap: 9 (ref 5–15)
BUN: 9 mg/dL (ref 8–23)
CO2: 32 mmol/L (ref 22–32)
Calcium: 8.3 mg/dL — ABNORMAL LOW (ref 8.9–10.3)
Chloride: 90 mmol/L — ABNORMAL LOW (ref 98–111)
Creatinine, Ser: 0.31 mg/dL — ABNORMAL LOW (ref 0.44–1.00)
GFR, Estimated: >60 mL/min (ref >60)
Glucose, Bld: 94 mg/dL (ref 70–99)
Potassium: 3.4 mmol/L — ABNORMAL LOW (ref 3.5–5.1)
Sodium: 131 mmol/L — ABNORMAL LOW (ref 135–145)
Total Bilirubin: 0.4 mg/dL (ref 0.3–1.2)
Total Protein: 6 g/dL — ABNORMAL LOW (ref 6.5–8.1)

## 2021-11-14 LAB — CBC
HCT: 29.8 % — ABNORMAL LOW (ref 36.0–46.0)
Hemoglobin: 9.6 g/dL — ABNORMAL LOW (ref 12.0–15.0)
MCH: 30.8 pg (ref 26.0–34.0)
MCHC: 32.2 g/dL (ref 30.0–36.0)
MCV: 95.5 fL (ref 80.0–100.0)
Platelets: 350 10*3/uL (ref 150–400)
RBC: 3.12 MIL/uL — ABNORMAL LOW (ref 3.87–5.11)
RDW: 14.7 % (ref 11.5–15.5)
WBC: 15.4 10*3/uL — ABNORMAL HIGH (ref 4.0–10.5)
nRBC: 0 % (ref 0.0–0.2)

## 2021-11-14 MED ORDER — MAGNESIUM OXIDE -MG SUPPLEMENT 400 (240 MG) MG PO TABS
200.0000 mg | ORAL_TABLET | Freq: Two times a day (BID) | ORAL | Status: DC
  Administered 2021-11-14 – 2021-11-28 (×28): 200 mg via ORAL
  Filled 2021-11-14 (×28): qty 1

## 2021-11-14 MED ORDER — ENSURE ENLIVE PO LIQD
237.0000 mL | Freq: Three times a day (TID) | ORAL | Status: DC
  Administered 2021-11-15 – 2021-11-16 (×4): 237 mL via ORAL

## 2021-11-14 MED ORDER — LORAZEPAM 2 MG/ML IJ SOLN: 1.0000 mg | Freq: Three times a day (TID) | INTRAMUSCULAR | Status: DC | PRN

## 2021-11-14 MED ORDER — ACETAMINOPHEN 325 MG PO TABS: 650.0000 mg | ORAL_TABLET | Freq: Four times a day (QID) | ORAL | Status: DC | PRN

## 2021-11-14 MED ORDER — ONDANSETRON HCL 4 MG/2ML IJ SOLN
4.0000 mg | Freq: Four times a day (QID) | INTRAMUSCULAR | Status: DC | PRN
  Administered 2021-12-11: 4 mg via INTRAVENOUS
  Filled 2021-11-14 (×4): qty 2

## 2021-11-14 MED ORDER — ROSUVASTATIN CALCIUM 20 MG PO TABS
20.0000 mg | ORAL_TABLET | Freq: Every day | ORAL | Status: DC
  Administered 2021-11-15 – 2021-12-12 (×28): 20 mg via ORAL
  Filled 2021-11-14 (×28): qty 1

## 2021-11-14 MED ORDER — OXYCODONE HCL 5 MG PO TABS: 5.0000 mg | ORAL_TABLET | Freq: Three times a day (TID) | ORAL | 0 refills | Status: DC

## 2021-11-14 MED ORDER — ZINC OXIDE 40 % EX OINT: TOPICAL_OINTMENT | CUTANEOUS | Status: DC | PRN

## 2021-11-14 MED ORDER — MENTHOL 3 MG MT LOZG
1.0000 | LOZENGE | OROMUCOSAL | Status: DC | PRN
Start: 2021-11-14 — End: 2021-12-12

## 2021-11-14 MED ORDER — CLONAZEPAM 0.5 MG PO TABS: 0.5000 mg | ORAL_TABLET | Freq: Three times a day (TID) | ORAL | 0 refills | Status: DC

## 2021-11-14 MED ORDER — SENNOSIDES-DOCUSATE SODIUM 8.6-50 MG PO TABS: 1.0000 | ORAL_TABLET | Freq: Two times a day (BID) | ORAL | Status: DC

## 2021-11-14 MED ORDER — PANTOPRAZOLE SODIUM 40 MG PO TBEC
40.0000 mg | DELAYED_RELEASE_TABLET | Freq: Every day | ORAL | Status: DC
  Administered 2021-11-15 – 2021-12-12 (×28): 40 mg via ORAL
  Filled 2021-11-14 (×28): qty 1

## 2021-11-14 MED ORDER — CLONAZEPAM 0.5 MG PO TABS
0.5000 mg | ORAL_TABLET | Freq: Three times a day (TID) | ORAL | Status: DC
  Administered 2021-11-14 – 2021-11-23 (×26): 0.5 mg via ORAL
  Filled 2021-11-14 (×26): qty 1

## 2021-11-14 MED ORDER — LEVALBUTEROL HCL 0.63 MG/3ML IN NEBU
0.6300 mg | INHALATION_SOLUTION | RESPIRATORY_TRACT | Status: DC | PRN
Start: 2021-11-14 — End: 2021-12-12
  Administered 2021-12-08: 0.63 mg via RESPIRATORY_TRACT
  Filled 2021-11-14 (×2): qty 3

## 2021-11-14 MED ORDER — ADULT MULTIVITAMIN W/MINERALS CH
1.0000 | ORAL_TABLET | Freq: Every day | ORAL | Status: DC
  Administered 2021-11-15 – 2021-12-12 (×28): 1 via ORAL
  Filled 2021-11-14 (×28): qty 1

## 2021-11-14 MED ORDER — METOPROLOL TARTRATE 12.5 MG HALF TABLET
12.5000 mg | ORAL_TABLET | Freq: Two times a day (BID) | ORAL | Status: DC
  Administered 2021-11-14 – 2021-12-12 (×43): 12.5 mg via ORAL
  Filled 2021-11-14 (×55): qty 1

## 2021-11-14 MED ORDER — QUETIAPINE FUMARATE 25 MG PO TABS: 75.0000 mg | ORAL_TABLET | Freq: Two times a day (BID) | ORAL | Status: DC

## 2021-11-14 MED ORDER — GUAIFENESIN-DM 100-10 MG/5ML PO SYRP
5.0000 mL | ORAL_SOLUTION | ORAL | Status: DC | PRN
  Administered 2021-11-17 – 2021-12-10 (×7): 5 mL via ORAL
  Filled 2021-11-14 (×7): qty 10

## 2021-11-14 MED ORDER — REVEFENACIN 175 MCG/3ML IN SOLN
175.0000 ug | Freq: Every day | RESPIRATORY_TRACT | Status: DC
  Administered 2021-11-17 – 2021-12-12 (×23): 175 ug via RESPIRATORY_TRACT
  Filled 2021-11-14 (×28): qty 3

## 2021-11-14 MED ORDER — BUDESONIDE 0.5 MG/2ML IN SUSP
0.5000 mg | Freq: Two times a day (BID) | RESPIRATORY_TRACT | Status: DC
Start: 2021-11-14 — End: 2021-12-12
  Administered 2021-11-14 – 2021-12-12 (×51): 0.5 mg via RESPIRATORY_TRACT
  Filled 2021-11-14 (×57): qty 2

## 2021-11-14 MED ORDER — SPIRONOLACTONE 25 MG PO TABS
25.0000 mg | ORAL_TABLET | Freq: Every day | ORAL | Status: DC
  Administered 2021-11-14: 25 mg via ORAL
  Filled 2021-11-14: qty 1

## 2021-11-14 MED ORDER — ENOXAPARIN SODIUM 40 MG/0.4ML IJ SOSY
40.0000 mg | PREFILLED_SYRINGE | INTRAMUSCULAR | Status: DC
  Administered 2021-11-15 – 2021-12-04 (×20): 40 mg via SUBCUTANEOUS
  Filled 2021-11-14 (×20): qty 0.4

## 2021-11-14 MED ORDER — SENNOSIDES-DOCUSATE SODIUM 8.6-50 MG PO TABS
2.0000 | ORAL_TABLET | Freq: Every day | ORAL | Status: DC
  Administered 2021-11-16 – 2021-11-26 (×10): 2 via ORAL
  Filled 2021-11-14 (×14): qty 2

## 2021-11-14 MED ORDER — MONTELUKAST SODIUM 10 MG PO TABS
10.0000 mg | ORAL_TABLET | Freq: Every morning | ORAL | Status: DC
  Administered 2021-11-15 – 2021-11-30 (×16): 10 mg via ORAL
  Filled 2021-11-14 (×19): qty 1

## 2021-11-14 MED ORDER — LORAZEPAM 1 MG PO TABS
1.0000 mg | ORAL_TABLET | Freq: Three times a day (TID) | ORAL | Status: DC | PRN
  Administered 2021-11-17 – 2021-11-20 (×3): 1 mg via ORAL
  Filled 2021-11-14 (×3): qty 1

## 2021-11-14 MED ORDER — ARFORMOTEROL TARTRATE 15 MCG/2ML IN NEBU
15.0000 ug | INHALATION_SOLUTION | Freq: Two times a day (BID) | RESPIRATORY_TRACT | Status: DC
Start: 2021-11-14 — End: 2021-12-12
  Administered 2021-11-14 – 2021-12-12 (×51): 15 ug via RESPIRATORY_TRACT
  Filled 2021-11-14 (×45): qty 2

## 2021-11-14 MED ORDER — GERHARDT'S BUTT CREAM
TOPICAL_CREAM | Freq: Four times a day (QID) | CUTANEOUS | Status: DC
  Administered 2021-11-17 – 2021-11-23 (×2): 1 via TOPICAL
  Filled 2021-11-14: qty 1

## 2021-11-14 MED ORDER — SPIRONOLACTONE 25 MG PO TABS
25.0000 mg | ORAL_TABLET | Freq: Every day | ORAL | Status: DC
  Administered 2021-11-15 – 2021-12-12 (×28): 25 mg via ORAL
  Filled 2021-11-14 (×28): qty 1

## 2021-11-14 MED ORDER — QUETIAPINE FUMARATE 50 MG PO TABS
75.0000 mg | ORAL_TABLET | Freq: Two times a day (BID) | ORAL | Status: DC
  Administered 2021-11-14 – 2021-11-22 (×16): 75 mg via ORAL
  Filled 2021-11-14 (×16): qty 1

## 2021-11-14 MED ORDER — POTASSIUM CHLORIDE CRYS ER 20 MEQ PO TBCR
20.0000 meq | EXTENDED_RELEASE_TABLET | Freq: Three times a day (TID) | ORAL | Status: DC
  Administered 2021-11-14 – 2021-11-25 (×34): 20 meq via ORAL
  Filled 2021-11-14 (×34): qty 1

## 2021-11-14 MED ORDER — POLYETHYLENE GLYCOL 3350 17 G PO PACK: 17.0000 g | PACK | Freq: Two times a day (BID) | ORAL | 0 refills | Status: DC

## 2021-11-14 MED ORDER — METOPROLOL TARTRATE 25 MG PO TABS: 12.5000 mg | ORAL_TABLET | Freq: Two times a day (BID) | ORAL | Status: DC

## 2021-11-14 MED ORDER — SPIRONOLACTONE 25 MG PO TABS: 25.0000 mg | ORAL_TABLET | Freq: Every day | ORAL | Status: DC

## 2021-11-14 MED ORDER — WHITE PETROLATUM EX OINT
TOPICAL_OINTMENT | CUTANEOUS | Status: DC | PRN
Start: 2021-11-14 — End: 2021-12-12

## 2021-11-14 MED ORDER — OXYCODONE HCL 5 MG PO TABS
5.0000 mg | ORAL_TABLET | Freq: Three times a day (TID) | ORAL | Status: DC
  Administered 2021-11-14 – 2021-12-05 (×61): 5 mg via ORAL
  Filled 2021-11-14 (×62): qty 1

## 2021-11-14 NOTE — Discharge Summary (Signed)
Physician Discharge Summary  [name] [id number] DOB: May 21, 1956 DOA: 09/24/2021  PCP: Everardo Beals, NP  Admit date: 09/24/2021 Discharge date: 11/14/2021  Time spent: 45 minutes  Recommendations for Outpatient Follow-up:  CIR for rehab PCP in 2 to 3 weeks, please review all imaging studies from this admission Pulmonary-trach clinic CT chest in 6 to 8 weeks to follow-up lung nodules Cardiology clinic follow-up in 1 month, titrate GDMT as tolerated   Discharge Diagnoses:  Principal Problem:   Acute respiratory failure with hypoxia and hypercapnia ([place]) Active Problems:   Chronic respiratory failure, tracheostomy dependence and chronic COPD   COPD exacerbation (HCC)   Ileus (HCC)   Hypokalemia   Anxiety   GERD (gastroesophageal reflux disease)   Hypernatremia   Acute on chronic systolic CHF (congestive heart failure) ([place])   Protein-calorie malnutrition, severe   Acute metabolic encephalopathy   Pressure injury of skin   Essential hypertension   Hyperlipidemia   Hospital-acquired pneumonia   Thrombocytopenia ([place])   Lung nodules   Asymptomatic bacteriuria   Discharge Condition: Stable  Diet recommendation: Low-sodium, heart healthy low-sodium, heart healthy  Filed Weights   11/06/21 0452 11/07/21 0500 11/08/21 0500  Weight: 50.9 kg 49 kg 51.4 kg    History of present illness:  66/F with history of COPD, anxiety presented to [facility], ED with dyspnea wheezing and cough, she was found to be in respiratory distress and hypoxic  Hospital Course:    4/17: Admitted to [facility] and started treatment for COPD exacerbation, BiPAP 4/18: ABG 7.09/105/232/31 on BiPAP, intubated  4/20: Echo showed reduced EF 4/21: Transferred to [facility] from [place] for Cardiology evaluation 4/29: New fever 5/1: Lurline Idol placed 5/11: Advanced to regular diet 5/15: Cuffless trach 5/17: Tracheal aspirate showed Klebsiella 5/23: Developed abdominal pain, distension, CT showed ?SBO, Gen  Surg consulted 5/25: Symptoms resolved with conservative measures 5/26: Gen Surg signed off 5/30: Abdominal distension recurred, radiograph showing distended bowel, Gen Surg reconsulted, suspected ileus 6/1: Abdominal symptoms mostly resolved 6/2-present: Stable pending placement    Assessment and Plan:  Acute respiratory failure with hypoxia and hypercapnia ([place]) Presented with SpO2 80s, in distress requiring BiPAP due to COPD flare.  Subsequently intubated, difficult to wean, required tracheostomy, but acute failure has resolved.  See below re: chronic failure.  -Also completed treatment for Klebsiella pneumonia   Chronic respiratory failure, tracheostomy dependence and chronic COPD Baseline FEV1 32% in 2021. -Secondary to severe COPD, extreme anxiety and pneumonia -Was unable to wean and eventually underwent tracheostomy placement in ICU on 5/1 - Continue LABA, ICS, LAMA - Continue SABA PRN - Continue Singulair, PPI -Follow-up with pulmonary, trach clinic   Throat pain-resolved as of 11/11/2021 Has some ulcers in back of throat, whitish without surrounding redness, doubt candida.  Suspect her throat pain and swallowing difficulty are from this. - Trial viscous lidocaine   Asymptomatic bacteriuria Urine culture 5/29 with Klebsiella.  Asymptomatic, UTI ruled out at this time.     Lung nodules Noted incidentally, ID consulted.  Crypto, histo and blasto Ags negative.   - Pulm recommend CT scan of the chest in 6 to 8 weeks to follow-up for resolution of likely infectious upper lobe nodules   Thrombocytopenia (HCC) Resolved   Hospital-acquired pneumonia In mid-May, patient developed symptoms of pneumonia.  Tracheal aspiration growing Klebsiella.  Completed antibiotic course and resolved     Hyperlipidemia - Continue Crestor    Essential hypertension BP soft - Continue metoprolol   Pressure injury of skin  Stage 2, coccyx, not present on admission   Acute metabolic  encephalopathy Resolved   Protein-calorie malnutrition, severe - Continue nutritional supplements - Consult dietitian     Acute on chronic systolic CHF (congestive heart failure) ([place]) 4/20 Echo--EF 40%, AK apex and anteroseptal, G1DD -Soft BP has limited GDMT in the past -Earlier during hospitalization, patient did develop bilateral infiltrates, edema, was treated with Lasix, this resolved.  Now appears euvolemic - Continue metoprolol -Restart low-dose Aldactone, further GDMT as BP tolerates   Hypernatremia Resolved   Lactic acidosis Driven primarily from her hypoxia and respiratory failure Sepsis ruled out   GERD (gastroesophageal reflux disease) - Continue PPI   Anxiety - Continue clonazepam and Seroquel   Hypokalemia Resolved   Ileus (HCC) Initially developed abdominal pain, distension ~5/23.  CT showed dilated sm bowel, possible partial SBO, Gen surgery consulted, symptoms resolved with suppositories and increased bowel regimen, Gen Surg signed off.  Reconsulted 5/30 due to again decreased bowel function, radiograph showing distended bowel again, and again resolved spontanseously.   SBO ruled out, ileus resolved with supportive care - Avoid hypokalemia, hypocalcemia, hypomagnesemia - Continue bowel regimen-Senokot and MiraLAX daily   COPD exacerbation ([place]) Presenting problem.  Now resolved, see above.        Discharge Exam: Vitals:   11/14/21 0830 11/14/21 0834  BP: 118/61   Pulse: 96   Resp:    Temp: 97.6 F (36.4 C)   SpO2: 98% 94%    General: AAOx3, no distress HEENT: Tracheostomy with trach collar CVS: S1-S2, regular rhythm Lungs: Scattered upper airway sounds Abdomen: Soft, nontender, bowel sounds present Extremities: No edema  Discharge Instructions    Allergies as of 11/14/2021   No Known Allergies      Medication List     STOP taking these medications    ALPRAZolam 1 MG tablet Commonly known as: XANAX   busPIRone 10 MG  tablet Commonly known as: BUSPAR   cefTRIAXone 1 g injection Commonly known as: ROCEPHIN   docusate sodium 100 MG capsule Commonly known as: COLACE   doxycycline 100 MG capsule Commonly known as: VIBRAMYCIN   ibuprofen 200 MG tablet Commonly known as: ADVIL   predniSONE 10 MG (21) Tbpk tablet Commonly known as: STERAPRED UNI-PAK 21 TAB       TAKE these medications    acetaminophen 325 MG tablet Commonly known as: TYLENOL Take 2 tablets (650 mg total) by mouth every 6 (six) hours as needed for mild pain (or Fever >/= 101).   albuterol 108 (90 Base) MCG/ACT inhaler Commonly known as: Ventolin HFA INHALE 2 PUFFS INTO THE LUNGS EVERY 6 HOURS AS NEEDED FOR WHEEZE/SHORTNESS OF BREATH   arformoterol 15 MCG/2ML Nebu Commonly known as: BROVANA Take 2 mLs (15 mcg total) by nebulization 2 (two) times daily.   Breztri Aerosphere 160-9-4.8 MCG/ACT Aero Generic drug: Budeson-Glycopyrrol-Formoterol Inhale 2 puffs into the lungs 2 (two) times daily. What changed: additional instructions   Breztri Aerosphere 160-9-4.8 MCG/ACT Aero Generic drug: Budeson-Glycopyrrol-Formoterol Inhale 2 puffs into the lungs in the morning and at bedtime. What changed: Another medication with the same name was changed. Make sure you understand how and when to take each.   clonazePAM 0.5 MG tablet Commonly known as: KLONOPIN Take 1 tablet (0.5 mg total) by mouth 3 (three) times daily.   fluticasone 50 MCG/ACT nasal spray Commonly known as: FLONASE Place 1 spray into both nostrils daily as needed for allergies.   ipratropium 17 MCG/ACT inhaler Commonly known as: ATROVENT HFA  Inhale 2 puffs into the lungs every 4 (four) hours as needed for wheezing.   loratadine 10 MG tablet Commonly known as: CLARITIN Take 10 mg by mouth daily as needed for allergies.   Magnesium 250 MG Tabs Take 500 mg by mouth in the morning.   metoprolol tartrate 25 MG tablet Commonly known as: LOPRESSOR Take 0.5 tablets  (12.5 mg total) by mouth 2 (two) times daily.   montelukast 10 MG tablet Commonly known as: SINGULAIR Take 10 mg by mouth in the morning.   omeprazole 20 MG capsule Commonly known as: PRILOSEC Take 20 mg by mouth daily.   oxyCODONE 5 MG immediate release tablet Commonly known as: Oxy IR/ROXICODONE Take 1 tablet (5 mg total) by mouth every 8 (eight) hours.   polyethylene glycol 17 g packet Commonly known as: MIRALAX / GLYCOLAX Take 17 g by mouth 2 (two) times daily.   QUEtiapine 25 MG tablet Commonly known as: SEROQUEL Take 3 tablets (75 mg total) by mouth 2 (two) times daily.   rosuvastatin 5 MG tablet Commonly known as: CRESTOR Take 1 tablet (5 mg total) by mouth daily.   senna-docusate 8.6-50 MG tablet Commonly known as: Senokot-S Take 1 tablet by mouth 2 (two) times daily.   Spiriva HandiHaler 18 MCG inhalation capsule Generic drug: tiotropium INHALE 1 CAPSULE VIA HANDIHALER ONCE DAILY AT THE SAME TIME EVERY DAY   spironolactone 25 MG tablet Commonly known as: ALDACTONE Take 1 tablet (25 mg total) by mouth daily.   tretinoin 0.1 % cream Commonly known as: RETIN-A Apply topically at bedtime.   triamcinolone acetonide 40 MG/ML Susp Commonly known as: TRIESENCE triamcinolone acetonide 40 mg/mL suspension for injection  Take 80 mg every day by injection route for 1 day.   Yupelri 175 MCG/3ML nebulizer solution Generic drug: revefenacin Take 3 mLs (175 mcg total) by nebulization daily.       No Known Allergies  Follow-up Information     Imogene Burn, PA-C Follow up on 12/17/2021.   Specialty: Cardiology Why: Please come to appt at the [place] office 12/17/21 at 2 PM You will see Ermalinda Barrios, PA Contact information: [address] [place] [address] [phone]                  The results of significant diagnostics from this hospitalization (including imaging, microbiology, ancillary and laboratory) are listed below for reference.     Significant Diagnostic Studies: CT ABDOMEN WO CONTRAST  Result Date: 10/27/2021 CLINICAL DATA:  Evaluate anatomy for G-tube placement EXAM: CT ABDOMEN WITHOUT CONTRAST TECHNIQUE: Multidetector CT imaging of the abdomen was performed following the standard protocol without IV contrast. RADIATION DOSE REDUCTION: This exam was performed according to the departmental dose-optimization program which includes automated exposure control, adjustment of the mA and/or kV according to patient size and/or use of iterative reconstruction technique. COMPARISON:  05/15/2019 11/22/2020 FINDINGS: Lower chest: Emphysematous changes noted in the lung bases. Ground-glass opacities in the right middle lobe likely due to localized inflammation. New cluster of pulmonary nodules noted in the right lower lobe (5/5) with the largest measuring 9 mm. Hepatobiliary: Right hepatic lobe calcification is most likely related to prior granulomatous inflammation. Liver and gallbladder otherwise unremarkable. Pancreas: Unremarkable. No pancreatic ductal dilatation or surrounding inflammatory changes. Spleen: Normal in size without focal abnormality. Adrenals/Urinary Tract: Adrenal glands are normal. Minimal bilateral hydronephrosis and right hydroureter is new since prior examination. Stomach/Bowel: Feeding tube terminates in the proximal duodenum. Gastric anatomy is amenable to percutaneous  gastrostomy tube placement. No dilated loop of bowel identified within the visualized abdomen. Vascular/Lymphatic: Atherosclerotic calcifications present throughout the visualized aorta. Other: No abdominal wall hernia or abnormality. Musculoskeletal: Disc spacer noted in L4-L5. No acute osseous abnormality. IMPRESSION: 1. Gastric anatomy is amenable to G-tube placement. 2. New cluster of pulmonary nodules present in the right lower lobe (image 5, series 5) with the largest measuring 9 mm. Dedicated contrast enhanced chest CT should be obtained. 3. Advanced  emphysema. Electronically Signed   By: Miachel Roux M.D.   On: 10/27/2021 12:20   CT CHEST W CONTRAST  Result Date: 10/28/2021 CLINICAL DATA:  66 year old female with lung nodules on recent abdominal CT. Shortness of breath. EXAM: CT CHEST WITH CONTRAST TECHNIQUE: Multidetector CT imaging of the chest was performed during intravenous contrast administration. RADIATION DOSE REDUCTION: This exam was performed according to the departmental dose-optimization program which includes automated exposure control, adjustment of the mA and/or kV according to patient size and/or use of iterative reconstruction technique. CONTRAST:  34mL OMNIPAQUE IOHEXOL 350 MG/ML SOLN COMPARISON:  10/27/2021 abdominal CT, 11/22/2020 chest CT and prior studies FINDINGS: Cardiovascular: Heart size is normal. Aortic atherosclerotic calcifications are noted. There is no evidence of thoracic aortic aneurysm or pericardial effusion. Mediastinum/Nodes: No enlarged mediastinal, hilar, or axillary lymph nodes. Tracheostomy tube noted and NG tube extending into the stomach identified. Lungs/Pleura: There are new scattered nodular, patchy and focal consolidative opacities within both lungs, some which appear low-density. These areas involve primarily the UPPER lobes, LEFT-greater-than-RIGHT. Index areas include a 11 mm RIGHT UPPER lobe nodular opacity (image 45: Series 4) and a 3.5 x 8 cm more confluent streaky opacity within the MEDIAL LEFT UPPER lobe (79:4). Other smaller areas of tree-in-bud opacities are noted along the periphery of the mid and LOWER lungs. Emphysema and biapical pleuroparenchymal scarring again identified. No pleural effusion or pneumothorax. Upper Abdomen: No acute abnormality. Musculoskeletal: No acute or suspicious bony abnormalities are noted. IMPRESSION: 1. New scattered bilateral nodular, patchy and focal consolidative opacities within both UPPER lobes and other smaller areas of tree-in-bud opacities along the periphery of  the mid and LOWER lungs. These findings are nonspecific but most likely represent infection. CT follow-up in 3 months is recommended following acute clinical episode. 2. Aortic Atherosclerosis (ICD10-I70.0) and Emphysema (ICD10-J43.9). Electronically Signed   By: Margarette Canada M.D.   On: 10/28/2021 14:42   CT ABDOMEN PELVIS W CONTRAST  Result Date: 10/30/2021 CLINICAL DATA:  Acute generalized abdominal pain. EXAM: CT ABDOMEN AND PELVIS WITH CONTRAST TECHNIQUE: Multidetector CT imaging of the abdomen and pelvis was performed using the standard protocol following bolus administration of intravenous contrast. RADIATION DOSE REDUCTION: This exam was performed according to the departmental dose-optimization program which includes automated exposure control, adjustment of the mA and/or kV according to patient size and/or use of iterative reconstruction technique. CONTRAST:  179mL OMNIPAQUE IOHEXOL 300 MG/ML  SOLN COMPARISON:  Oct 27, 2021.  Oct 28, 2021. FINDINGS: Lower chest: Grossly stable nodular densities are noted in right lower lobe as noted on CT scan of Oct 28, 2021. Hepatobiliary: Hepatic steatosis. No gallstones or biliary dilatation is noted. Pancreas: Unremarkable. No pancreatic ductal dilatation or surrounding inflammatory changes. Spleen: Normal in size without focal abnormality. Adrenals/Urinary Tract: Adrenal glands and kidneys appear normal. Status post cystectomy with ileal conduit placement. Stomach/Bowel: Distal tip of feeding tube is seen in distal stomach. Mild gastric distention is noted. Stool and contrast is noted throughout the colon. Dilated small bowel loops are noted which  extend to the pelvis near surgical anastomotic site, concerning for possible partial obstruction. Vascular/Lymphatic: Aortic atherosclerosis. No enlarged abdominal or pelvic lymph nodes. Reproductive: Status post hysterectomy. No adnexal masses. Other: No hernia is noted. Small amount of free fluid is noted in the pelvis  of uncertain significance. Musculoskeletal: No acute or significant osseous findings. IMPRESSION: Dilated small bowel loops are noted which extend to the pelvis near surgical anastomotic site, concerning for partial obstruction. Stool and contrast are noted throughout the nondilated colon. Patient is status post cystectomy and ileal conduit placement. Hepatic steatosis. Small amount of free fluid is noted in the pelvis of uncertain significance. Distal tip of feeding tube is seen in distal stomach. Mild gastric distention is noted. Aortic Atherosclerosis (ICD10-I70.0). Electronically Signed   By: Marijo Conception M.D.   On: 10/30/2021 15:13   DG CHEST PORT 1 VIEW  Result Date: 10/28/2021 CLINICAL DATA:  Inpatient encounter for hypoxemia. EXAM: PORTABLE CHEST 1 VIEW COMPARISON:  Portable chest 10/24/2021, chest CT with contrast earlier today FINDINGS: 8:42 p.m., 10/28/2021. Tracheostomy cannula tip is 6.3 cm from the carina. The cardiac size is normal. Feeding tube is well inside the stomach but the radiopaque tip is not included in the exam. The mediastinum is normally outlined. There is again noted patchy and coarsely nodular infiltrate in the medial perihilar/suprahilar left upper lobe, peripheral right upper lobe mid field and lateral right middle lobe. Rest of the lungs emphysematous and otherwise clear. No new or worsened infiltrate is seen. No pleural effusion is seen.  No acute osseous abnormality. IMPRESSION: Multifocal infiltrates consistent with multilobar pneumonia. Background COPD. Overall aeration seems unchanged since today's earlier chest CT but with worsened opacities in these areas compared with 10/24/2021. Electronically Signed   By: Telford Nab M.D.   On: 10/28/2021 20:55   DG CHEST PORT 1 VIEW  Result Date: 10/24/2021 CLINICAL DATA:  Shortness of breath. EXAM: PORTABLE CHEST 1 VIEW COMPARISON:  Portable chest 10/08/2021. FINDINGS: There is a tracheostomy cannula again noted with tip 5.6  cm from the carina. Feeding tube is in place with the intragastric course not fully evaluated. The lungs are emphysematous. There is increased patchy hazy opacity in the right mid field which is most likely either in the base of the upper lobe or in the superior segment of the lower lobe. Findings most likely due to pneumonitis. There is additional small haziness in the peripheral left mid field which could be additional pneumonitis or overlying artifact. Remainder of the lungs clear. The cardiomediastinal silhouette is unremarkable. There is aortic atherosclerosis. No significant pleural effusion. Osteopenia. IMPRESSION: 1. Interval new patchy hazy opacity right mid lung periphery, lesser new opacity peripheral left mid lung. 2. Findings most likely due to multilobar pneumonia. Background COPD. 3. Clinical correlation and radiographic follow-up recommended. Electronically Signed   By: Telford Nab M.D.   On: 10/24/2021 03:14   DG Abd Portable 1V  Result Date: 11/07/2021 CLINICAL DATA:  Small bowel obstruction. Large bowel movement this morning. EXAM: PORTABLE ABDOMEN - 1 VIEW COMPARISON:  11/06/2021. FINDINGS: Flocculated barium in the small bowel with majority of contrast in the colon. IMPRESSION: Majority of the contrast is seen in the colon. No evidence of a small-bowel obstruction. Electronically Signed   By: Lorin Picket M.D.   On: 11/07/2021 08:54   DG Abd Portable 1V  Result Date: 11/06/2021 CLINICAL DATA:  Partial small bowel obstruction. EXAM: PORTABLE ABDOMEN - 1 VIEW COMPARISON:  11/05/2021; 11/01/2021; 10/31/2021; 10/30/2021; CT abdomen pelvis-10/30/2021  FINDINGS: Redemonstrated moderate gaseous distension multiple loops of small bowel with index loop of small bowel within the right mid abdomen measuring 4.1 cm in diameter. A small amount of air is seen within the colon however there is otherwise a paucity of colonic gas. No supine evidence of pneumoperitoneum. No pneumatosis or portal  venous gas. Limited visualization of the lower thorax is normal Punctate calcification overlying the right upper abdominal quadrant compatible with partially calcified porta hepatis lymph node demonstrated on previous abdominal CT. Mild-to-moderate multilevel lumbar spine DDD is suspected though incompletely evaluated. Post lower lumbar intervertebral disc space replacement, incompletely evaluated. IMPRESSION: Similar findings suggestive of partial small bowel obstruction. Electronically Signed   By: Simonne Come M.D.   On: 11/06/2021 08:09   DG Abd Portable 1V  Result Date: 11/05/2021 CLINICAL DATA:  Follow-up for partial small bowel obstruction. EXAM: PORTABLE ABDOMEN - 1 VIEW COMPARISON:  11/01/2021, 10/31/2021 and CT dated 10/30/2021. FINDINGS: Small-bowel dilation has increased from the most recent prior study, similar to its appearance on 10/31/2021. Relatively opaque stool noted throughout the colon and in the rectum, similar to the prior exams. IMPRESSION: 1. Small-bowel dilation developing since the most recent prior study, but similar to the exam from 10/31/2021, suggesting a recurrent partial small bowel obstruction. Electronically Signed   By: Amie Portland M.D.   On: 11/05/2021 11:57   DG Abd Portable 1V  Result Date: 11/01/2021 CLINICAL DATA:  Small bowel obstruction. EXAM: PORTABLE ABDOMEN - 1 VIEW COMPARISON:  Oct 31, 2021. FINDINGS: The bowel gas pattern is normal. No radio-opaque calculi or other significant radiographic abnormality are seen. IMPRESSION: Negative. Electronically Signed   By: Lupita Raider M.D.   On: 11/01/2021 08:27   DG Abd Portable 1V  Result Date: 10/31/2021 CLINICAL DATA:  Small bowel obstruction. EXAM: PORTABLE ABDOMEN - 1 VIEW COMPARISON:  Radiograph 10/30/2021 FINDINGS: Interval decrease in prominence of central small bowel lobes. Loops measure less than 3 cm. Moderate volume stool in the descending colon and rectum. Gas in the rectum. IMPRESSION: Improving  small bowel obstruction pattern. Electronically Signed   By: Genevive Bi M.D.   On: 10/31/2021 09:46   DG Abd Portable 1V  Result Date: 10/30/2021 CLINICAL DATA:  abdominal distention EXAM: Portable AP supine view of the abdomen COMPARISON:  Abdominal x-ray from Oct 21, 2021 FINDINGS: Again seen is the feeding tube with its tip at the gastric pylorus. Mild gaseous distention of the small bowel loops, with air distended small bowel loop measuring 4.2 cm in greatest transverse diameter at the right lower abdomen and has increased in the interim. There is some air seen in the large bowel loops as well. No radio-opaque calculi or other significant radiographic abnormality are seen. IMPRESSION: Tip of the enteric tube terminates at the pylorus. Mild gaseous distention of the small bowel loops on the basis of likely partial small bowel obstruction and has increased in the interim. Electronically Signed   By: Marjo Bicker M.D.   On: 10/30/2021 11:19   DG Abd Portable 1V  Result Date: 10/21/2021 CLINICAL DATA:  Abdominal distension, enteric catheter EXAM: PORTABLE ABDOMEN - 1 VIEW COMPARISON:  09/27/2021 FINDINGS: Frontal view of the lower chest and upper abdomen demonstrates enteric catheter passing below diaphragm tip overlying the gastric antrum. Bowel gas pattern is grossly unremarkable. Chronic scarring throughout the lungs. IMPRESSION: 1. Enteric catheter tip projecting over the gastric antrum. Electronically Signed   By: Sharlet Salina M.D.   On: 10/21/2021 17:48  DG Abd Portable 1V-Small Bowel Obstruction Protocol-initial, 8 hr delay  Result Date: 11/06/2021 CLINICAL DATA:  Small bowel follow through. Images obtained @ 2215. EXAM: PORTABLE ABDOMEN - 1 VIEW COMPARISON:  X-ray abdomen 530 20:38 a.m., x-ray abdomen 11/05/2021, CT abdomen pelvis 10/30/2021 FINDINGS: PO contrast overlies the gastric lumen. Persistent gaseous dilatation of the small bowel. No radio-opaque calculi or other significant  radiographic abnormality are seen. Metallic densities overlying the lower abdomen consistent with vertebral spine surgical hardware. IMPRESSION: 1. PO contrast remains within the gastric lumen. 2. Persistent gaseous dilatation of the small bowel. Electronically Signed   By: Iven Finn M.D.   On: 11/06/2021 22:49    Microbiology: Recent Results (from the past 240 hour(s))  Blastomyces Antigen     Status: None   Collection Time: 11/05/21 10:12 AM   Specimen: Blood  Result Value Ref Range Status   Blastomyces Antigen None Detected None Detected ng/mL Final    Comment: (NOTE) Results reported as ng/mL in 0.2 - 14.7 ng/mL range Results above the limit of detection but below 0.2 ng/mL are reported as 'Positive, Below the Limit of Quantification' Results above 14.7 ng/mL are reported as 'Positive, Above the Limit of Quantification'    Specimen Type SERUM  Final    Comment: (NOTE) Performed At: [facility] [place], [place] [id number] Bruce Donath MD [id number]   Urine Culture     Status: Abnormal   Collection Time: 11/05/21 11:50 AM   Specimen: Urine, Catheterized  Result Value Ref Range Status   Specimen Description URINE, CATHETERIZED  Final   Special Requests   Final    NONE Performed at [facility], [address], [place], [place] [address]    Culture >=100,000 COLONIES/mL KLEBSIELLA PNEUMONIAE (A)  Final   Report Status 11/07/2021 FINAL  Final   Organism ID, Bacteria KLEBSIELLA PNEUMONIAE (A)  Final      Susceptibility   Klebsiella pneumoniae - MIC*    AMPICILLIN >=32 RESISTANT Resistant     CEFAZOLIN <=4 SENSITIVE Sensitive     CEFEPIME <=0.12 SENSITIVE Sensitive     CEFTRIAXONE <=0.25 SENSITIVE Sensitive     CIPROFLOXACIN <=0.25 SENSITIVE Sensitive     GENTAMICIN <=1 SENSITIVE Sensitive     IMIPENEM <=0.25 SENSITIVE Sensitive     NITROFURANTOIN 64 INTERMEDIATE Intermediate     TRIMETH/SULFA <=20 SENSITIVE Sensitive      AMPICILLIN/SULBACTAM 16 INTERMEDIATE Intermediate     PIP/TAZO 16 SENSITIVE Sensitive     * >=100,000 COLONIES/mL KLEBSIELLA PNEUMONIAE     Labs: Basic Metabolic Panel: Recent Labs  Lab 11/08/21 1044 11/09/21 0232 11/11/21 0344 11/14/21 0606  NA 130*  --  130* 131*  K 3.6 3.8 3.6 3.4*  CL 91*  --  90* 90*  CO2 31  --  35* 32  GLUCOSE 124*  --  97 94  BUN 7*  --  8 9  CREATININE 0.41*  --  0.32* 0.31*  CALCIUM 8.8*  --  8.2* 8.3*  MG 2.0 1.9  --   --    Liver Function Tests: Recent Labs  Lab 11/14/21 0606  AST 51*  ALT 61*  ALKPHOS 181*  BILITOT 0.4  PROT 6.0*  ALBUMIN 1.9*   No results for input(s): LIPASE, AMYLASE in the last 168 hours. No results for input(s): AMMONIA in the last 168 hours. CBC: Recent Labs  Lab 11/11/21 0344 11/14/21 0606  WBC 18.9* 15.4*  HGB 9.3* 9.6*  HCT 28.2* 29.8*  MCV 96.2 95.5  PLT 297 350   Cardiac Enzymes: No results for input(s): CKTOTAL, CKMB, CKMBINDEX, TROPONINI in the last 168 hours. BNP: BNP (last 3 results) Recent Labs    09/27/21 0420  BNP 1,052.0*    ProBNP (last 3 results) No results for input(s): PROBNP in the last 8760 hours.  CBG: No results for input(s): GLUCAP in the last 168 hours.     Signed:  Domenic Polite MD.  [facility] 11/14/2021, 11:25 AM

## 2021-11-14 NOTE — H&P (Signed)
Physical Medicine and Rehabilitation Admission H&P    Chief Complaint  Patient presents with   Critical illness myopathy     HPI:  [name] is a 66 year old female with history of COPD--oxygen dependent (but took herself off it), chronic LBP, anxiety d/o w/panic attacks, recent diagnosis of PNA who was admitted on 09/24/21 with hypoxia and tachypnea due to acute hypoxemia hypercarbic respiratory failure. She was placed on BIPAP but continued to worsen with obtundation requiring intubation and sedated with precedex and ketamine to help manage anxiety.    Cardiology consulted for new diagnosis of CM with EF 40% and wall abnormality concerning for ischemic etiology but not a candidate for ischemic work at this time. She had issues with sinus tachycardia but need adjustment of BB due to soft BP.  She needs to recover significantly from current illness and outpatient follow up recommended.    Tracheostomy placed on 05/01 and weaned to ATC with high oxygen needs and severe anxiety issues. Trach changed to CFS #6 on 05/15 and antibiotics added on 05/16 due to increase in respiratory distress/leucocytosis. Tracheal aspirate postive for Klebsiella HAP and she has completed 10 day course of cefepime on 11/03/21.   She continued to have significant oxygen needs and was weaned to to 6 Liters by 06/05. No plans for decannulation at this time. She is tolerating PMSV during the day.   Hospital course significant for partial SBP v/s ileus 05/24 treated with liquid diet --has history of cystectomy with ileal neobladder and repair of transabdominal vaginal fistula.  Surgery signed off on 05/26 but were reconsulted due to abdominal distension with N/V, pain, constipation and small amount of air seen on KUB. Gastrograffin enema showed persistent gaseous dilatation of bowel with po contrast in gastric lumen and follow up KUB showed majority of contrast in colon without evidence of SBO.   Bowel program  augmented with recommendations of daily suppositories, consistent bowel regimen, keeping Mg>2.0 and K>4.0 to optimize bowel function as well as mobility. Bowels function improving, diet advanced to regular on 06/02 and CCS signed off.   She has had issues with leucocytosis--CT chest showed multiple nodules and multiple UAs done for work up. Dr. Daiva Eves consulted and felt that leucocytosis could be secondary to chronic aspiration and as no signs of infection with asymptomatic bacturia. Cryto, histo and blasto Antigens negative and pulmonary recommends repeating chest CT in 6-8 weeks to monitor for resolution. 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 CIR recommended due to functional decline.  Currently has no complaints, somnolent.    Review of Systems  Constitutional:  Negative for chills and fever.  HENT:  Negative for hearing loss and tinnitus.   Eyes:  Negative for blurred vision and double vision.  Respiratory:  Negative for cough and shortness of breath.   Cardiovascular:  Negative for chest pain and palpitations.  Gastrointestinal:  Positive for heartburn and nausea. Negative for abdominal pain, constipation and vomiting.  Genitourinary:  Negative for dysuria and urgency.  Musculoskeletal:  Positive for back pain. Negative for myalgias.  Skin:  Negative for rash.  Neurological:  Positive for focal weakness and weakness. Negative for dizziness and headaches.  Psychiatric/Behavioral:  Negative for memory loss. The patient is nervous/anxious. The patient  does not have insomnia.     Past Medical History:  Diagnosis Date   Anxiety    Chronic back pain    COPD (chronic obstructive pulmonary disease) (HCC)    Interstitial cystitis    Tobacco  abuse     Past Surgical History:  Procedure Laterality Date   ABDOMINAL HYSTERECTOMY     BACK SURGERY     ILEO LOOP NEOBLADDER     VESICOVAGINAL FISTULA REPAIR      Family History  Problem Relation Age of Onset   Cancer Mother    Heart disease Father    Atrial fibrillation Sister    Heart disease Maternal Grandmother     Social History:  Married. Disabled mechanic--due to LBP/cystitis. Has help with housework twice a week/husband brings in food. She  reports that she quit smoking about 2 years ago. Her smoking use included cigarettes. She has a 90.00 pack-year smoking history. She has never used smokeless tobacco. She reports that she does not drink alcohol and does not use drugs.   Allergies: Not on File   Facility-Administered Medications Prior to Admission  Medication Dose Route Frequency Provider Last Rate Last Admin   budesonide (PULMICORT) nebulizer solution 0.25 mg  0.25 mg Nebulization BID Collene Gobble, MD       Medications Prior to Admission  Medication Sig Dispense Refill   albuterol (VENTOLIN HFA) 108 (90 Base) MCG/ACT inhaler INHALE 2 PUFFS INTO THE LUNGS EVERY 6 HOURS AS NEEDED FOR WHEEZE/SHORTNESS OF BREATH 36 each 11   ALPRAZolam (XANAX) 1 MG tablet Take 0.5-1 mg by mouth daily as needed for sleep or anxiety.     Budeson-Glycopyrrol-Formoterol (BREZTRI AEROSPHERE) 160-9-4.8 MCG/ACT AERO Inhale 2 puffs into the lungs 2 (two) times daily. (Patient taking differently: Inhale 2 puffs into the lungs 2 (two) times daily. Before lunch) 10.7 g 5   Budeson-Glycopyrrol-Formoterol (BREZTRI AEROSPHERE) 160-9-4.8 MCG/ACT AERO Inhale 2 puffs into the lungs in the morning and at bedtime. 5.9 g 0   busPIRone (BUSPAR) 10 MG tablet Take 10 mg by mouth 3 (three) times daily.     cefTRIAXone (ROCEPHIN) 1 g injection ceftriaxone 1 gram solution for injection  Take 1 g every day by injection route as directed for 1 day.     docusate sodium (COLACE) 100 MG capsule Take 1 capsule (100  mg total) by mouth 2 (two) times daily. (Patient taking differently: Take 100 mg by mouth 2 (two) times daily as needed (constipation).) 10 capsule 0   doxycycline (VIBRAMYCIN) 100 MG capsule Take 100 mg by mouth 2 (two) times daily. For 7 days started on 09/23/21     fluticasone (FLONASE) 50 MCG/ACT nasal spray Place 1 spray into both nostrils daily as needed for allergies.     ibuprofen (ADVIL) 200 MG tablet Take 400 mg by mouth every 8 (eight) hours as needed (pain.).     ipratropium (ATROVENT HFA) 17 MCG/ACT inhaler Inhale 2 puffs into the lungs every 4 (four) hours as needed for wheezing.     loratadine (CLARITIN) 10 MG tablet Take 10 mg by mouth daily as needed for allergies.     Magnesium 250 MG TABS Take 500 mg by mouth in the morning.     montelukast (SINGULAIR) 10 MG tablet Take 10 mg by mouth in the morning.     omeprazole (PRILOSEC) 20 MG capsule Take 20 mg by mouth daily.     predniSONE (STERAPRED UNI-PAK 21 TAB) 10 MG (21) TBPK tablet Take 10 mg by  mouth See admin instructions. 6,5,4,3,2,1     revefenacin (YUPELRI) 175 MCG/3ML nebulizer solution Take 3 mLs (175 mcg total) by nebulization daily. 90 mL 3   tretinoin (RETIN-A) 0.1 % cream Apply topically at bedtime.     triamcinolone acetonide (TRIESENCE) 40 MG/ML SUSP triamcinolone acetonide 40 mg/mL suspension for injection  Take 80 mg every day by injection route for 1 day.     arformoterol (BROVANA) 15 MCG/2ML NEBU Take 2 mLs (15 mcg total) by nebulization 2 (two) times daily. (Patient not taking: Reported on 09/24/2021) 120 mL 6   rosuvastatin (CRESTOR) 5 MG tablet Take 1 tablet (5 mg total) by mouth daily. (Patient not taking: Reported on 09/24/2021) 90 tablet 3   SPIRIVA HANDIHALER 18 MCG inhalation capsule INHALE 1 CAPSULE VIA HANDIHALER ONCE DAILY AT THE SAME TIME EVERY DAY (Patient not taking: Reported on 09/24/2021) 30 capsule 6  Home: [place] expects to be discharged to:: Private residence Living  Arrangements: Spouse/significant other Available Help at Discharge: Family, Available 24 hours/day Type of Home: House Home Access: Stairs to enter [employer] of Steps: 3 in front, 1 in garage Entrance Stairs-Rails: None Home Layout: One level Bathroom Shower/Tub: Gaffer, Chiropodist: Handicapped height (sister was unsure) Architectural technologist: Yes Home Equipment: Civil engineer, contracting - built in  Lives With: Significant other   Functional History: Prior Function Prior Level of Function : Independent/Modified Independent   Functional Status:  Mobility: Bed Mobility Overal bed mobility: Needs Assistance Bed Mobility: Rolling, Sidelying to Sit, Sit to Sidelying Rolling: Mod assist Sidelying to sit: Mod assist, +2 for physical assistance Supine to sit: Mod assist, HOB elevated Sit to supine: Mod assist Sit to sidelying: Mod assist, +2 for physical assistance General bed mobility comments: Multimodal cues for technique and positioning for rolling and coming to sit; started with rolling for pericare/cleanup after bowels moved; tehn assist pt up to sitting EOB via log roll Transfers Overall transfer level: Needs assistance Equipment used: Ambulation equipment used Transfers: Sit to/from Stand, Bed to chair/wheelchair/BSC Sit to Stand: From elevated surface, Mod assist, +2 physical assistance Bed to/from chair/wheelchair/BSC transfer type:: Via Lift equipment Stand pivot transfers: Max assist, +2 safety/equipment Squat pivot transfers: Max assist  Lateral/Scoot Transfers: Total assist, +2 physical assistance Transfer via Lift Equipment: Stedy General transfer comment: Stood to stedy from bed, then multiple sit to stands in stedy for [employer] Ambulation/Gait General Gait Details: unable   ADL: ADL Overall ADL's : Needs assistance/impaired Eating/Feeding: Set up, Sitting Eating/Feeding Details (indicate cue type and reason): able to hold cup and  drink Grooming: Wash/dry hands, Wash/dry face, Set up, Bed level Grooming Details (indicate cue type and reason): Refused to sit up thissession due to nausea and decreased sats Lower Body Bathing: Total assistance, Bed level Lower Body Bathing Details (indicate cue type and reason): unable to assist due to fatigue, assisted with rolling in bed Upper Body Dressing : Maximal assistance, Sitting Upper Body Dressing Details (indicate cue type and reason): Pt attempting to doff and don hospital gown, increased weakness making it difficult for her to raise her arms to shoulder level to pull the sleeves up or down. Lower Body Dressing: Maximal assistance, Bed level Lower Body Dressing Details (indicate cue type and reason): Figure four to don socks. Max A to initate and maintain position Toilet Transfer: Moderate assistance, +2 for physical assistance Toilet Transfer Details (indicate cue type and reason): with Stedy Toileting- Clothing Manipulation and Hygiene: Total assistance, Sit to/from stand, Sitting/lateral  lean, Bed level Toileting - Clothing Manipulation Details (indicate cue type and reason): for pericare Functional mobility during ADLs: Moderate assistance, +2 for physical assistance General ADL Comments: Pt remained bed level this session   Cognition: Cognition Overall Cognitive Status: Impaired/Different from baseline Orientation Level: Oriented X4 Cognition Arousal/Alertness: Awake/alert Behavior During Therapy: Flat affect, Agitated, Anxious Overall Cognitive Status: Impaired/Different from baseline Area of Impairment: Safety/judgement, Awareness, Problem solving Current Attention Level: Selective Following Commands: Follows one step commands consistently, Follows one step commands with increased time Safety/Judgement: Decreased awareness of safety, Decreased awareness of deficits Awareness: Emergent Problem Solving: Slow processing, Requires verbal cues, Requires tactile  cues General Comments: Requiring increased time and cuing Difficult to assess due to: Tracheostomy (no PMV present in room during session)      Blood pressure 124/67, pulse 100, temperature 98 F (36.7 C), resp. rate 16, SpO2 95 %. Physical Exam Gen: Somnolent. Debilitated. Thin female with ATC 8 L and PMSV in place. Wet voice but able to clear without cues.   HEENT: oral mucosa pink and moist, NCAT Cardio: Tachycardia Chest: normal effort, normal rate of breathing Abd: still a lot distention Ext: no edema Psych: somnolent Skin: intact Neurological:     Mental Status: She is alert.     Comments: Occasional wet voice.  She thought it was April or May and day "Thurs". She was able to follow simple motor commands but occasional word finding deficits. Muscle wasting noted  with severe generalized weakness with bilateral foot drop and BLE weakness Left> Right.    Results for orders placed or performed during the hospital encounter of 09/24/21 (from the past 48 hour(s))  CBC     Status: Abnormal   Collection Time: 11/14/21  6:06 AM  Result Value Ref Range   WBC 15.4 (H) 4.0 - 10.5 K/uL   RBC 3.12 (L) 3.87 - 5.11 MIL/uL   Hemoglobin 9.6 (L) 12.0 - 15.0 g/dL   HCT 29.8 (L) 36.0 - 46.0 %   MCV 95.5 80.0 - 100.0 fL   MCH 30.8 26.0 - 34.0 pg   MCHC 32.2 30.0 - 36.0 g/dL   RDW 14.7 11.5 - 15.5 %   Platelets 350 150 - 400 K/uL   nRBC 0.0 0.0 - 0.2 %    Comment: Performed at [facility], [place] [address], [place], [place] [address]  Comprehensive metabolic panel     Status: Abnormal   Collection Time: 11/14/21  6:06 AM  Result Value Ref Range   Sodium 131 (L) 135 - 145 mmol/L   Potassium 3.4 (L) 3.5 - 5.1 mmol/L   Chloride 90 (L) 98 - 111 mmol/L   CO2 32 22 - 32 mmol/L   Glucose, Bld 94 70 - 99 mg/dL    Comment: Glucose reference range applies only to samples taken after fasting for at least 8 hours.   BUN 9 8 - 23 mg/dL   Creatinine, Ser 0.31 (L) 0.44 - 1.00 mg/dL   Calcium 8.3  (L) 8.9 - 10.3 mg/dL   Total Protein 6.0 (L) 6.5 - 8.1 g/dL   Albumin 1.9 (L) 3.5 - 5.0 g/dL   AST 51 (H) 15 - 41 U/L   ALT 61 (H) 0 - 44 U/L   Alkaline Phosphatase 181 (H) 38 - 126 U/L   Total Bilirubin 0.4 0.3 - 1.2 mg/dL   GFR, Estimated >60 >60 mL/min    Comment: (NOTE) Calculated using the CKD-EPI Creatinine Equation (2021)    Anion gap  9 5 - 15    Comment: Performed at [facility], [place] [address], [place], [place] [address]   No results found.    Blood pressure 124/67, pulse 100, temperature 98 F (36.7 C), resp. rate 16, SpO2 95 %.  Medical Problem List and Plan: 1. Functional deficits secondary to pulmonary debility  -patient may shower  -ELOS/Goals: 20-22 days S  -Admit to CIR 2.  Antithrombotics: -DVT/anticoagulation:  Pharmaceutical: Lovenox  -antiplatelet therapy: N/a 3. Pain:  Managed by Dr.Phillips. Continue Methadone and tramadol-->tid to qid.  --Oxycodone 5 mg TID with tylenol prn for pain. 4. Mood: Team to provide ego support/encouragement. LCSW to follow for evaluation and support.   -antipsychotic agents:  N/A 5. Neuropsych: This patient may be intermittently capable of making decisions on her own behalf. 6. Stage 2 decubitus ulcer: Routine pressure relief measures.   --continue air mattress overlay for MASD/Stage 2 decub.  7. Fluids/Electrolytes/Nutrition: Strict I/O. Continue regular diet  --Ensure supplements TID for severe malnutrition. 8. COPD w/VDRF/Trach dependent: Continue ATC 5-8 liters --followed by Dr. Lamonte Sakai.  --Keep saturation 88-92% per pulmonary --encourage pulmonary hygiene  --Continue Singulair, Yuperi, Brovana and Pulmicort nebs.  9. Leucocytosis: Continue to trend and monitor for signs of infection  --has been treated for Kleb PNA/Kleb UTI  --recheck CBC in am. WBC trending down from 25.5-->15.4 on 06/07 10. Panic attacks/Anxiety d/o: Klonopin weaned down 05/30 to  0.5 mg TID.  --Seroquel 75 mg BID. Continue ativan 1mg  tid prn  (po/IM route) 11. Cardiomyopathy: Continue Metoprolol 12.5 mg bid and Crestor.   --aldactone added 06/07-->monitor for orthostatic symptoms.  --continues to have intermittent tachycardia. Monitor for symptoms with increased activity.  12. Ileus/intermittent issues w/N/V: Has been refusing miralax and senna intermittently. Will decrease to Miralax to daily w/Senna S daily  --Keep Mg>2.0 and K>4.0 for adequate supplementation/recurrence.    --K trending down and subtherapeutic today-->will add Kdur 20 TID and check lytes X 2 days  --add Mg for supplement.  13. Pulmonary nodules: PCCM recommends repeat CT 6-8 weeks from 10/28/21 to monitor for resolution.  14. Interstitial cystitis s/p cystectomy and fistula repair: Followed by Dr.Evans.   I have personally performed a face to face diagnostic evaluation, including, but not limited to relevant history and physical exam findings, of this patient and developed relevant assessment and plan.  Additionally, I have reviewed and concur with the physician assistant's documentation above.  Leeroy Cha, MA   Reesa Chew, PA-C

## 2021-11-14 NOTE — Progress Notes (Signed)
Orthopedic Tech Progress Note Patient Details:  SEDALIA GREESON [birth year] [id number] Bilateral medium PRAFO Boots have been ordered from [facility].  Patient ID: JILLIANNA STANEK, female   DOB: 01-19-1956, 66 y.o.   MRN: [id number]  Donald Pore 11/14/2021, 5:17 PM

## 2021-11-14 NOTE — Progress Notes (Signed)
Inpatient Rehab Admissions Coordinator:    I have a CIR bed for this Pt. Today, RN may call [phone].  Keymani Mclean, MS, CCC-SLP Rehab Admissions Coordinator  [phone] (celll) [phone] (office)  

## 2021-11-14 NOTE — Progress Notes (Signed)
PMR Admission Coordinator Pre-Admission Assessment   Patient: [name] is an 66 y.o., female MRN: [id number] DOB: Oct 27, 1955 Height: _0  (154.9 cm) Weight: 50.8 kg   Insurance Information HMO:   yes  PPO:      PCP:      IPA:      80/20:      OTHER:  PRIMARY: Blue Medicare      Policy#: [id number]      Subscriber: Pt CM Name: [name]       Phone#: [phone]     Fax#: [phone] I received authorization from [facility] at [facility] (initially on 5/30, but updated on 6/7 for admit 5/1-0/25 Pre-Cert#: [id number]     Employer:  Benefits:  Phone #: [phone]     Name:  [name] Date: 06/10/2021 - still active Deductible: does not have one OOP Max: $3,950 ($302.29 met) CIR: $335/day co-pay for days 1-6 SNF: $0.00 Copayment per day for days 1-20; $196 Copayment per day for days 21-60; $0.00 copayment for days 61-100 Maximum of 100 days/benefit period Outpatient:  $10 copay/visit Home Health:  100% coverage DME: 80% coverage, 20% co-insurance Providers: in network   SECONDARY:       Policy#:      Phone#:    Development worker, community:       Phone#:    The Engineer, petroleum" for patients in Inpatient Rehabilitation Facilities with attached "Privacy Act [place] Records" was provided and verbally reviewed with: Patient and Family   Emergency Contact Information Contact Information       Name Relation Home Work Mobile    Gajda,Edward Spouse [phone]   [phone]    Salley Hews Sister     [phone]           Current Medical History  Patient Admitting Diagnosis: Respiratory Failure History of Present Illness: [name] is a 66 y.o. female with medical history significant for COPD, uncontrolled anxiety. Patient presented to the [facility] ED 09/24/21 with complaints of difficulty breathing, cough, wheezing of about 1 week duration.  She also reports wheezing, and fatigue.  She reports O2 sats down to 78% on room air. Patient  98.8.  Heart rate 92-139.  Tachypneic with respiratory rate 33-37.  Blood pressure systolic 053Z to 767H.  O2 sats 96 to 100% on 4 L.  O2 sats on room air not documented in ED.  Diffuse expiratory wheezing heard in ED.Patient was evaluated by RT in the ED and was placed on BiPAP. 2 view chest x-ray without acute abnormality. IV Solu-Medrol, DuoNeb started.  Ativan 1 mg oral tablet given. Pt ultimately failed BiPAP and required mechanical ventilation, transferred to [facility] 4/21 due to new finding of LV dysfunction Echo 4/20 with EF 40% , akinesis of apex and septal wall. . Failed weaning attempt 4/28. Trach placed on 5/1. Pt. With persistent encephalopathy throughout admission. Inially with coretrak, but initiated regular diet 11/02/21. PT/OT/SLP were consulted and recommended CIR to assist return to PLOF.    Patient's medical record from [facility] and [facility]  has been reviewed by the rehabilitation admission coordinator and physician.   Past Medical History      Past Medical History:  Diagnosis Date   Anxiety     Chronic back pain     COPD (chronic obstructive pulmonary disease) (HCC)     Tobacco abuse        Has the patient had major surgery during 100 days prior to admission? Yes  Family History   family history includes Atrial fibrillation in her sister.   Current Medications   Current Facility-Administered Medications:    0.45 % sodium chloride infusion, , Intravenous, Continuous, Mariel Aloe, MD, Stopped at 11/04/21 1034   0.9 %  sodium chloride infusion, , Intravenous, PRN, Olalere, Adewale A, MD, Stopped at 10/17/21 0748   0.9 %  sodium chloride infusion, 250 mL, Intravenous, Continuous, Meier, Hortencia Conradi, MD   acetaminophen (TYLENOL) tablet 650 mg, 650 mg, Per Tube, Q6H PRN, 650 mg at 10/29/21 1802 **OR** acetaminophen (TYLENOL) suppository 650 mg, 650 mg, Rectal, Q6H PRN, Millen, Jessica B, RPH   albuterol (PROVENTIL) (2.5 MG/3ML) 0.083% nebulizer  solution 2.5 mg, 2.5 mg, Nebulization, BID, Mariel Aloe, MD, 2.5 mg at 11/04/21 2014   arformoterol (BROVANA) nebulizer solution 15 mcg, 15 mcg, Nebulization, BID, Tat, David, MD, 15 mcg at 11/04/21 2014   bisacodyl (DULCOLAX) suppository 10 mg, 10 mg, Rectal, Daily, Norm Parcel, PA-C   budesonide (PULMICORT) nebulizer solution 0.5 mg, 0.5 mg, Nebulization, BID, Tat, David, MD, 0.5 mg at 11/04/21 2015   chlorhexidine gluconate (MEDLINE KIT) (PERIDEX) 0.12 % solution 15 mL, 15 mL, Mouth Rinse, BID, Olalere, Adewale A, MD, 15 mL at 11/04/21 0941   clonazePAM (KLONOPIN) tablet 0.5 mg, 0.5 mg, Per Tube, TID, Julian Hy, DO, 0.5 mg at 11/04/21 2101   docusate (COLACE) 50 MG/5ML liquid 100 mg, 100 mg, Per Tube, BID PRN, Olalere, Adewale A, MD, 100 mg at 10/21/21 1102   docusate sodium (COLACE) capsule 100 mg, 100 mg, Oral, BID, Meuth, Brooke A, PA-C, 100 mg at 11/04/21 2101   enoxaparin (LOVENOX) injection 40 mg, 40 mg, Subcutaneous, Q24H, Nettey, Ralph A, MD, 40 mg at 11/04/21 1022   feeding supplement (ENSURE ENLIVE / ENSURE PLUS) liquid 237 mL, 237 mL, Oral, TID BM, Mariel Aloe, MD, 237 mL at 11/04/21 1957   Gerhardt's butt cream, , Topical, QID, Collene Gobble, MD, Given at 11/04/21 2103   guaiFENesin-dextromethorphan (ROBITUSSIN DM) 100-10 MG/5ML syrup 5 mL, 5 mL, Per Tube, Q4H PRN, Millen, Jessica B, RPH, 5 mL at 10/29/21 1030   levalbuterol (XOPENEX) nebulizer solution 0.63 mg, 0.63 mg, Nebulization, Q4H PRN, Tat, David, MD, 0.63 mg at 10/27/21 0454   liver oil-zinc oxide (DESITIN) 40 % ointment, , Topical, PRN, Maryjane Hurter, MD, Given at 11/02/21 2148   LORazepam (ATIVAN) injection 1 mg, 1 mg, Intravenous, Q6H PRN, Dahal, Binaya, MD, 1 mg at 11/05/21 0555   metoprolol tartrate (LOPRESSOR) tablet 12.5 mg, 12.5 mg, Per Tube, BID, Dahal, Binaya, MD, 12.5 mg at 11/04/21 2101   montelukast (SINGULAIR) tablet 10 mg, 10 mg, Per Tube, q AM, Millen, Jessica B, RPH, 10 mg at 11/05/21  0600   morphine (PF) 2 MG/ML injection 2-4 mg, 2-4 mg, Intravenous, Q4H PRN, Mariel Aloe, MD, 2 mg at 11/05/21 0157   ondansetron (ZOFRAN) injection 4 mg, 4 mg, Intravenous, Q6H PRN, Mariel Aloe, MD, 4 mg at 11/05/21 8099   oxyCODONE (Oxy IR/ROXICODONE) immediate release tablet 5 mg, 5 mg, Per Tube, Q8H, Sanjuan Dame, MD, 5 mg at 11/05/21 0556   polyethylene glycol (MIRALAX / GLYCOLAX) packet 17 g, 17 g, Per Tube, BID, Norm Parcel, PA-C, 17 g at 11/04/21 0941   prochlorperazine (COMPAZINE) injection 10 mg, 10 mg, Intravenous, Q6H PRN, Mariel Aloe, MD, 10 mg at 11/04/21 2328   QUEtiapine (SEROQUEL) tablet 75 mg, 75 mg, Per Tube, BID, Dahal, Marlowe Aschoff, MD, 75  mg at 11/04/21 2101   revefenacin (YUPELRI) nebulizer solution 175 mcg, 175 mcg, Nebulization, Daily, Tat, David, MD, 175 mcg at 11/03/21 0731   rosuvastatin (CRESTOR) tablet 20 mg, 20 mg, Per NG tube, Daily, Stanford Breed, Denice Bors, MD, 20 mg at 11/04/21 0940   senna (SENOKOT) tablet 8.6 mg, 1 tablet, Per Tube, Daily, Noemi Chapel P, DO, 8.6 mg at 11/04/21 0940   sodium chloride HYPERTONIC 3 % nebulizer solution 4 mL, 4 mL, Nebulization, BID, Mariel Aloe, MD, 4 mL at 11/04/21 2015   white petrolatum (VASELINE) gel, , Topical, PRN, Rigoberto Noel, MD, Given at 10/19/21 2210   Patients Current Diet:  Diet Order                  DIET SOFT Room service appropriate? Yes; Fluid consistency: Thin  Diet effective now                         Precautions / Restrictions Precautions Precautions: Fall Precaution Comments: trach, cortrak Restrictions Weight Bearing Restrictions: No    Has the patient had 2 or more falls or a fall with injury in the past year? No   Prior Activity Level Community (5-7x/wk): working, driving   Prior Functional Level Self Care: Did the patient need help bathing, dressing, using the toilet or eating? Independent   Indoor Mobility: Did the patient need assistance with walking from room to  room (with or without device)? Independent   Stairs: Did the patient need assistance with internal or external stairs (with or without device)? Independent   Functional Cognition: Did the patient need help planning regular tasks such as shopping or remembering to take medications? Independent   Patient Information Are you of Hispanic, Latino/a,or Spanish origin?: A. No, not of Hispanic, Latino/a, or Spanish origin What is your race?: A. White Do you need or want an interpreter to communicate with a doctor or health care staff?: 0. No   Patient's Response To:  Health Literacy and Transportation Is the patient able to respond to health literacy and transportation needs?: Yes Health Literacy - How often do you need to have someone help you when you read instructions, pamphlets, or other written material from your doctor or pharmacy?: Sometimes In the past 12 months, has lack of transportation kept you from medical appointments or from getting medications?: No In the past 12 months, has lack of transportation kept you from meetings, work, or from getting things needed for daily living?: No   Development worker, international aid / [place] Devices/Equipment: None Home Equipment: Shower seat - built in   Prior Device Use: Indicate devices/aids used by the patient prior to current illness, exacerbation or injury? None of the above   Current Functional Level Cognition   Overall Cognitive Status: Within Functional Limits for tasks assessed Difficult to assess due to: Tracheostomy (no PMV present in room during session) Current Attention Level: Selective Orientation Level: Oriented X4 Following Commands: Follows one step commands consistently, Follows one step commands with increased time General Comments: Cognition appears to be close to her baseline, following 2 step commands and participating in conversations    Extremity Assessment (includes Sensation/Coordination)   Upper Extremity  Assessment: RUE deficits/detail, LUE deficits/detail RUE Deficits / Details: Minimal strength, unable to lift against gravity, PROM limited due to bed position and edema, grip weak with minimal thumb mobility. RUE Coordination: decreased fine motor, decreased gross motor LUE Deficits / Details: Minimal strength, unable to  lift against gravity, PROM limited due to bed position and edema, grip weak with minimal thumb mobility. minutely stronger than RUE. LUE Coordination: decreased fine motor, decreased gross motor  Lower Extremity Assessment: Defer to PT evaluation RLE Deficits / Details: following command able to move hip, knee and ankle within limited active range, very stiff, limited by RLE Coordination: decreased fine motor LLE Deficits / Details: following command able to move hip, knee and ankle within limited active range, very stiff LLE Coordination: decreased fine motor     ADLs   Overall ADL's : Needs assistance/impaired Eating/Feeding: Set up, Sitting Eating/Feeding Details (indicate cue type and reason): Once pt got into recliner, she needed all containers open, however then she was able to feed herself. Grooming: Set up, Wash/dry face, Wash/dry hands, Oral care, Sitting Grooming Details (indicate cue type and reason): completed in reclienr Lower Body Bathing: Total assistance, Bed level Lower Body Bathing Details (indicate cue type and reason): unable to assist due to fatigue, assisted with rolling in bed Upper Body Dressing : Maximal assistance, Sitting Upper Body Dressing Details (indicate cue type and reason): Pt attempting to doff and don hospital gown, increased weakness making it difficult for her to raise her arms to shoulder level to pull the sleeves up or down. Lower Body Dressing: Total assistance, Sitting/lateral leans Lower Body Dressing Details (indicate cue type and reason): Pt unable to bend down to don socks or underwear or to position herself in figure 4 position to  assist with tasks Toilet Transfer: Maximal assistance, +2 for physical assistance, +2 for safety/equipment, Stand-pivot Toilet Transfer Details (indicate cue type and reason): Pt able to stand from EOB with max A and required +2 to assist with pivot, was attempting to move BLE for pivot Toileting- Clothing Manipulation and Hygiene: Total assistance, Sitting/lateral lean Toileting - Clothing Manipulation Details (indicate cue type and reason): completed in bed after BM Functional mobility during ADLs: Maximal assistance, +2 for physical assistance, +2 for safety/equipment General ADL Comments: Pt with improved self efficacy this session, wanting to be more independent with tasks and grateful for setup to allow her to be more independent     Mobility   Overal bed mobility: Needs Assistance Bed Mobility: Rolling, Sidelying to Sit Rolling: Min assist Sidelying to sit: Mod assist, HOB elevated Supine to sit: Mod assist, +2 for safety/equipment Sit to supine: Mod assist General bed mobility comments: minA to roll L /R for pericare. With log roll technique, patient requires modA for trunk elevation     Transfers   Overall transfer level: Needs assistance Equipment used: 2 person hand held assist Transfers: Sit to/from Stand, Bed to chair/wheelchair/BSC Sit to Stand: Max assist, +2 physical assistance, +2 safety/equipment Bed to/from chair/wheelchair/BSC transfer type:: Stand pivot Stand pivot transfers: Max assist, +2 physical assistance, +2 safety/equipment Squat pivot transfers: Max assist  Lateral/Scoot Transfers: Total assist, +2 physical assistance Transfer via Lift Equipment: [place] transfer comment: maxA+2 to stand from bed surface with HHAx2, cues and facilitation at hips for hip extension. Unable to take steps this session so performed stand pivot     Ambulation / Gait / Stairs / Proofreader / Balance Dynamic Sitting Balance Sitting balance -  Comments: statically her balance is fair but dynamicaly poor Balance Overall balance assessment: Needs assistance Sitting-balance support: Single extremity supported, Feet supported Sitting balance-Leahy Scale: Fair Sitting balance - Comments: statically her balance is fair but dynamicaly poor Postural control: Posterior  lean Standing balance support: Bilateral upper extremity supported Standing balance-Leahy Scale: Zero Standing balance comment: Pt unable to hold her self up in standing without max +2 assist     Special needs/care consideration Trach size 6 cuffless and Special service needs 35% O2 via TC    Previous Home Environment (from acute therapy documentation) Living Arrangements: Spouse/significant other  Lives With: Significant other Available Help at Discharge: Family, Available 24 hours/day Type of Home: Warm Mckenzie: One level Home Access: Stairs to enter Entrance Stairs-Rails: None Entrance Stairs-Number of Steps: 3 in front, 1 in garage Bathroom Shower/Tub: Gaffer, Chiropodist: Handicapped height (sister was unsure) Architectural technologist: Yes How Accessible: Accessible via walker [place]: No   Discharge Living Setting Plans for Discharge Living Setting: Patient's home Type of Home at Discharge: House Discharge Home Layout: One level Discharge Home Access: Stairs to enter Entrance Stairs-Rails: None Entrance Stairs-Number of Steps: 3 in front, 1 in garage Discharge Bathroom Shower/Tub: Gaffer, Tub/shower unit Discharge Bathroom Toilet: Handicapped height (sister was unsure) Discharge Bathroom Accessibility: Yes How Accessible: Accessible via walker Does the patient have any problems obtaining your medications?: No   Social/Family/Support Systems Patient Roles: Other (Comment) Anticipated Caregiver: Salley Hews, sister and other family Anticipated Caregiver's Contact Information: [phone] Caregiver  Availability: 24/7 Discharge Plan Discussed with Primary Caregiver: Yes Is Caregiver In Agreement with Plan?: Yes Does Caregiver/Family have Issues with Lodging/Transportation while Pt is in Rehab?: No   Goals Patient/Family Goal for Rehab: PT/OT Mod A Expected length of stay: 16-18 days Pt/Family Agrees to Admission and willing to participate: Yes Program Orientation Provided & Reviewed with Pt/Caregiver Including Roles  & Responsibilities: Yes   Decrease burden of Care through IP rehab admission: Specialzed equipment needs, Decrease number of caregivers, Bowel and bladder program, and Patient/family education   Possible need for SNF placement upon discharge: not anticipated   Patient Condition: I have reviewed medical records from [facility] , spoken with CM, and patient and family member. I met with patient at the bedside for inpatient rehabilitation assessment.  Patient will benefit from ongoing PT, OT, and SLP, can actively participate in 3 hours of therapy a day 5 days of the week, and can make measurable gains during the admission.  Patient will also benefit from the coordinated team approach during an Inpatient Acute Rehabilitation admission.  The patient will receive intensive therapy as well as Rehabilitation physician, nursing, social worker, and care management interventions.  Due to safety, skin/wound care, disease management, medication administration, pain management, and patient education the patient requires 24 hour a day rehabilitation nursing.  The patient is currently Mod-max A with mobility and basic ADLs.  Discharge setting and therapy post discharge at home with home health is anticipated.  Patient has agreed to participate in the Acute Inpatient Rehabilitation Program and will admit tomorrow.   Preadmission Screen Completed By:  Genella Mech, 11/05/2021 7:43 AM ______________________________________________________________________   Discussed status with  Dr. Ranell Patrick  on 11/14/21 at 31 and received approval for admission today.   Admission Coordinator:  Genella Mech, CCC-SLP, time 1000/Date 11/14/21    Assessment/Plan: Diagnosis: Pulmonary debility Does the need for close, 24 hr/day Medical supervision in concert with the patient's rehab needs make it unreasonable for this patient to be served in a less intensive setting? Yes Co-Morbidities requiring supervision/potential complications: acute hypoxic respiratory failure with hypoxia and hypercapnia, COPD exacerbation, ileus, hypokalemia, anxiety Due to bladder management, bowel management, safety,  skin/wound care, disease management, medication administration, pain management, and patient education, does the patient require 24 hr/day rehab nursing? Yes Does the patient require coordinated care of a physician, rehab nurse, PT, OT, and SLP to address physical and functional deficits in the context of the above medical diagnosis(es)? Yes Addressing deficits in the following areas: balance, endurance, locomotion, strength, transferring, bowel/bladder control, bathing, dressing, feeding, grooming, toileting, speech, and psychosocial support Can the patient actively participate in an intensive therapy program of at least 3 hrs of therapy 5 days a week? Yes The potential for patient to make measurable gains while on inpatient rehab is excellent Anticipated functional outcomes upon discharge from inpatient rehab: min assist PT, supervision OT, supervision SLP Estimated rehab length of stay to reach the above functional goals is: 12-16 days Anticipated discharge destination: Home 10. Overall Rehab/Functional Prognosis: excellent     MD Signature: Leeroy Cha, MD

## 2021-11-14 NOTE — Progress Notes (Signed)
Inpatient Rehabilitation Admission Medication Review by a Pharmacist  A complete drug regimen review was completed for this patient to identify any potential clinically significant medication issues.  High Risk Drug Classes Is patient taking? Indication by Medication  Antipsychotic Yes Quetiapine: mood, agitation Prochlorperazine: PRN nausea/vomiting  Anticoagulant Yes Enoxaparin: VTE ppx  Antibiotic No   Opioid Yes Oxycodone: pain  Antiplatelet No   Hypoglycemics/insulin No   Vasoactive Medication Yes Metoprolol: hypertension  Chemotherapy No   Other Yes Levalbuterol: PRN SOB Arformoterol, Budesonide, montelukast, revenacin: SOB/COPD Clonazepam, lorazepam: anxiety/depression Mag-Ox, KCL, MVI: supplement Ondansetron: PRN nausea/vomiting Pantoprazole: GERD ppx Miralax, Senokot-S, Fleet, bisacodyl: constipation Rosuvastatin: HLD Spironolactone: CHF, HTN Trazodone: PRN sleep     Type of Medication Issue Identified Description of Issue Recommendation(s)  Drug Interaction(s) (clinically significant)     Duplicate Therapy     Allergy     No Medication Administration End Date     Incorrect Dose     Additional Drug Therapy Needed     Significant med changes from prior encounter (inform family/care partners about these prior to discharge). Discontinued medications: - alprazolam, buspirone, docusate, ibuprofen, prednisone Resume during CIR admission if appropriate  Other       Clinically significant medication issues were identified that warrant physician communication and completion of prescribed/recommended actions by midnight of the next day:  No   Pharmacist comments:  Duplicate BZD: noted that both scheduled clonazepam and PRN lorazepam both ordered along with scheduled oxycodone; monitor for sedation, dizziness, respiratory depression   Time spent performing this drug regimen review (minutes): 20   Thank you for allowing pharmacy to be a part of this patient's  care.  Thelma Barge, PharmD Clinical Pharmacist

## 2021-11-15 ENCOUNTER — Encounter (HOSPITAL_COMMUNITY): Payer: Medicare Other

## 2021-11-15 ENCOUNTER — Inpatient Hospital Stay (HOSPITAL_COMMUNITY): Payer: Medicare Other

## 2021-11-15 DIAGNOSIS — G6281 Critical illness polyneuropathy: Principal | ICD-10-CM

## 2021-11-15 DIAGNOSIS — R5381 Other malaise: Secondary | ICD-10-CM | POA: Diagnosis present

## 2021-11-15 LAB — COMPREHENSIVE METABOLIC PANEL
ALT: 67 U/L — ABNORMAL HIGH (ref 0–44)
AST: 57 U/L — ABNORMAL HIGH (ref 15–41)
Albumin: 2 g/dL — ABNORMAL LOW (ref 3.5–5.0)
Alkaline Phosphatase: 213 U/L — ABNORMAL HIGH (ref 38–126)
Anion gap: 7 (ref 5–15)
BUN: 10 mg/dL (ref 8–23)
CO2: 33 mmol/L — ABNORMAL HIGH (ref 22–32)
Calcium: 8.2 mg/dL — ABNORMAL LOW (ref 8.9–10.3)
Chloride: 95 mmol/L — ABNORMAL LOW (ref 98–111)
Creatinine, Ser: 0.32 mg/dL — ABNORMAL LOW (ref 0.44–1.00)
GFR, Estimated: >60 mL/min (ref >60)
Glucose, Bld: 110 mg/dL — ABNORMAL HIGH (ref 70–99)
Potassium: 3.9 mmol/L (ref 3.5–5.1)
Sodium: 135 mmol/L (ref 135–145)
Total Bilirubin: 0.5 mg/dL (ref 0.3–1.2)
Total Protein: 6.5 g/dL (ref 6.5–8.1)

## 2021-11-15 LAB — CBC WITH DIFFERENTIAL/PLATELET
Abs Immature Granulocytes: 0.14 10*3/uL — ABNORMAL HIGH (ref 0.00–0.07)
Basophils Absolute: 0 10*3/uL (ref 0.0–0.1)
Basophils Relative: 0 %
Eosinophils Absolute: 0.2 10*3/uL (ref 0.0–0.5)
Eosinophils Relative: 1 %
HCT: 32 % — ABNORMAL LOW (ref 36.0–46.0)
Hemoglobin: 10 g/dL — ABNORMAL LOW (ref 12.0–15.0)
Immature Granulocytes: 1 %
Lymphocytes Relative: 12 %
Lymphs Abs: 1.7 10*3/uL (ref 0.7–4.0)
MCH: 30.5 pg (ref 26.0–34.0)
MCHC: 31.3 g/dL (ref 30.0–36.0)
MCV: 97.6 fL (ref 80.0–100.0)
Monocytes Absolute: 0.8 10*3/uL (ref 0.1–1.0)
Monocytes Relative: 6 %
Neutro Abs: 12 10*3/uL — ABNORMAL HIGH (ref 1.7–7.7)
Neutrophils Relative %: 80 %
Platelets: 426 10*3/uL — ABNORMAL HIGH (ref 150–400)
RBC: 3.28 MIL/uL — ABNORMAL LOW (ref 3.87–5.11)
RDW: 15 % (ref 11.5–15.5)
WBC: 14.9 10*3/uL — ABNORMAL HIGH (ref 4.0–10.5)
nRBC: 0 % (ref 0.0–0.2)

## 2021-11-15 LAB — MAGNESIUM: Magnesium: 2 mg/dL (ref 1.7–2.4)

## 2021-11-15 MED ORDER — METOCLOPRAMIDE HCL 5 MG/ML IJ SOLN
5.0000 mg | Freq: Four times a day (QID) | INTRAMUSCULAR | Status: DC
  Administered 2021-11-16 (×2): 5 mg via INTRAVENOUS
  Filled 2021-11-15 (×3): qty 2

## 2021-11-15 MED ORDER — TRAZODONE HCL 50 MG PO TABS
25.0000 mg | ORAL_TABLET | Freq: Every evening | ORAL | Status: DC | PRN
  Administered 2021-11-19 – 2021-11-25 (×6): 50 mg via ORAL
  Administered 2021-11-27: 25 mg via ORAL
  Administered 2021-11-30 – 2021-12-01 (×2): 50 mg via ORAL
  Administered 2021-12-01: 25 mg via ORAL
  Administered 2021-12-02 – 2021-12-11 (×7): 50 mg via ORAL
  Filled 2021-11-15 (×19): qty 1

## 2021-11-15 MED ORDER — PROCHLORPERAZINE MALEATE 5 MG PO TABS
5.0000 mg | ORAL_TABLET | Freq: Four times a day (QID) | ORAL | Status: DC | PRN
  Administered 2021-11-19 – 2021-11-29 (×7): 10 mg via ORAL
  Filled 2021-11-15 (×8): qty 2

## 2021-11-15 MED ORDER — BISACODYL 10 MG RE SUPP
10.0000 mg | Freq: Every day | RECTAL | Status: DC | PRN
  Administered 2021-11-20 – 2021-12-12 (×4): 10 mg via RECTAL
  Filled 2021-11-15 (×6): qty 1

## 2021-11-15 MED ORDER — ENOXAPARIN SODIUM 40 MG/0.4ML IJ SOSY: 40.0000 mg | PREFILLED_SYRINGE | INTRAMUSCULAR | Status: DC

## 2021-11-15 MED ORDER — POLYETHYLENE GLYCOL 3350 17 G PO PACK: 17.0000 g | PACK | Freq: Every day | ORAL | Status: DC | PRN

## 2021-11-15 MED ORDER — SIMETHICONE 80 MG PO CHEW
80.0000 mg | CHEWABLE_TABLET | Freq: Four times a day (QID) | ORAL | Status: DC | PRN
  Administered 2021-11-15 – 2021-11-21 (×3): 80 mg via ORAL
  Filled 2021-11-15 (×3): qty 1

## 2021-11-15 MED ORDER — PROCHLORPERAZINE 25 MG RE SUPP: 12.5000 mg | Freq: Four times a day (QID) | RECTAL | Status: DC | PRN

## 2021-11-15 MED ORDER — ALUM & MAG HYDROXIDE-SIMETH 200-200-20 MG/5ML PO SUSP
30.0000 mL | ORAL | Status: DC | PRN
Start: 2021-11-15 — End: 2021-12-12
  Administered 2021-12-12: 30 mL via ORAL
  Filled 2021-11-15: qty 30

## 2021-11-15 MED ORDER — ACETAMINOPHEN 325 MG PO TABS
325.0000 mg | ORAL_TABLET | ORAL | Status: DC | PRN
  Administered 2021-11-20 – 2021-11-30 (×4): 650 mg via ORAL
  Filled 2021-11-15 (×6): qty 2

## 2021-11-15 MED ORDER — FLEET ENEMA 7-19 GM/118ML RE ENEM
1.0000 | ENEMA | Freq: Once | RECTAL | Status: DC | PRN
Start: 2021-11-15 — End: 2021-12-12

## 2021-11-15 MED ORDER — DIPHENHYDRAMINE HCL 12.5 MG/5ML PO ELIX: 12.5000 mg | ORAL_SOLUTION | Freq: Four times a day (QID) | ORAL | Status: DC | PRN

## 2021-11-15 MED ORDER — FLUCONAZOLE 100 MG PO TABS
100.0000 mg | ORAL_TABLET | Freq: Every day | ORAL | Status: AC
  Administered 2021-11-15 – 2021-11-19 (×5): 100 mg via ORAL
  Filled 2021-11-15 (×5): qty 1

## 2021-11-15 MED ORDER — PROCHLORPERAZINE EDISYLATE 10 MG/2ML IJ SOLN: 5.0000 mg | Freq: Four times a day (QID) | INTRAMUSCULAR | Status: DC | PRN

## 2021-11-15 MED ORDER — POLYETHYLENE GLYCOL 3350 17 G PO PACK: 17.0000 g | PACK | Freq: Two times a day (BID) | ORAL | Status: DC

## 2021-11-15 MED ORDER — CHLORHEXIDINE GLUCONATE 0.12% ORAL RINSE (MEDLINE KIT)
15.0000 mL | Freq: Two times a day (BID) | OROMUCOSAL | Status: DC
  Administered 2021-11-15 – 2021-11-16 (×3): 15 mL via OROMUCOSAL

## 2021-11-15 MED ORDER — GUAIFENESIN-DM 100-10 MG/5ML PO SYRP: 5.0000 mL | ORAL_SOLUTION | Freq: Four times a day (QID) | ORAL | Status: DC | PRN

## 2021-11-15 NOTE — Progress Notes (Signed)
PROGRESS NOTE   Subjective/Complaints:  Pt feels that her legs are weaker than arms , no issues prior to admission other than COPD, chronic back pain  (lumbar fusion ~20rs ago) and chronic urinary incont with interstitial cystitis   ROS- neg CP, SOB, N/V/D  Objective:   No results found. Recent Labs    11/14/21 0606  WBC 15.4*  HGB 9.6*  HCT 29.8*  PLT 350   Recent Labs    11/14/21 0606  NA 131*  K 3.4*  CL 90*  CO2 32  GLUCOSE 94  BUN 9  CREATININE 0.31*  CALCIUM 8.3*    Intake/Output Summary (Last 24 hours) at 11/15/2021 0753 Last data filed at 11/14/2021 1817 Gross per 24 hour  Intake 117 ml  Output --  Net 117 ml     Pressure Injury 11/03/21 Coccyx Medial Stage 2 -  Partial thickness loss of dermis presenting as a shallow open injury with a red, pink wound bed without slough. (Active)  11/03/21 1556  Location: Coccyx  Location Orientation: Medial  Staging: Stage 2 -  Partial thickness loss of dermis presenting as a shallow open injury with a red, pink wound bed without slough.  Wound Description (Comments):   Present on Admission:     Physical Exam: Vital Signs Blood pressure 124/67, pulse (!) 101, temperature 97.8 F (36.6 C), resp. rate 18, SpO2 92 %.   General: No acute distress Mood and affect are appropriate Heart: Regular rate and rhythm no rubs murmurs or extra sounds Lungs: Clear to auscultation, breathing unlabored, no rales or wheezes Abdomen: Positive bowel sounds, soft nontender to palpation, nondistended Extremities: No clubbing, cyanosis, or edema Skin: No evidence of breakdown, no evidence of rash Neurologic: Cranial nerves II through XII intact, motor strength is 4/5 in bilateral deltoid, bicep, tricep, grip,  trace hip flexor, knee extensors,0/5  ankle dorsiflexor and plantar flexor Sensory exam mildly reduced sensation to LT in feet, proprioception intact   Musculoskeletal:  Full range of motion in UE , limited AROM in LE due to weakness  No joint swelling   Assessment/Plan: 1. Functional deficits which require 3+ hours per day of interdisciplinary therapy in a comprehensive inpatient rehab setting. Physiatrist is providing close team supervision and 24 hour management of active medical problems listed below. Physiatrist and rehab team continue to assess barriers to discharge/monitor patient progress toward functional and medical goals  Care Tool:  Bathing              Bathing assist       Upper Body Dressing/Undressing Upper body dressing        Upper body assist      Lower Body Dressing/Undressing Lower body dressing            Lower body assist       Toileting Toileting    Toileting assist       Transfers Chair/bed transfer  Transfers assist           Locomotion Ambulation   Ambulation assist              Walk 10 feet activity   Assist  Walk 50 feet activity   Assist           Walk 150 feet activity   Assist           Walk 10 feet on uneven surface  activity   Assist           Wheelchair     Assist               Wheelchair 50 feet with 2 turns activity    Assist            Wheelchair 150 feet activity     Assist          Blood pressure 124/67, pulse (!) 101, temperature 97.8 F (36.6 C), resp. rate 18, SpO2 92 %.  Medical Problem List and Plan: 1. 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  Antithrombotics: -DVT/anticoagulation:  Pharmaceutical: Lovenox             -antiplatelet therapy: N/a 3. Pain:  Managed by Dr.Phillips. Continue Methadone and tramadol-->tid to qid.  --Oxycodone 5 mg TID with tylenol prn for pain. 4. Mood: Team to  provide ego support/encouragement. LCSW to follow for evaluation and support.              -antipsychotic agents:  N/A 5. Neuropsych: This patient may be intermittently capable of making decisions on her own behalf. 6. Stage 2 decubitus ulcer: Routine pressure relief measures.              --continue air mattress overlay for MASD/Stage 2 decub.  7. Fluids/Electrolytes/Nutrition: Strict I/O. Continue regular diet             --Ensure supplements TID for severe malnutrition. 8. COPD w/VDRF/Trach dependent: Continue ATC 5-8 liters --followed by Dr. Lamonte Sakai.  --Keep saturation 88-92% per pulmonary --encourage pulmonary hygiene  --Continue Singulair, Yuperi, Brovana and Pulmicort nebs.  9. Leucocytosis: Continue to trend and monitor for signs of infection             --has been treated for Kleb PNA/Kleb UTI             --recheck CBC in am. WBC trending down from 25.5-->15.4 on 06/07 10. Panic attacks/Anxiety d/o: Klonopin weaned down 05/30 to  0.5 mg TID.             --Seroquel 75 mg BID. Continue ativan 1mg  tid prn (po/IM route), has not taken supplemental Ativan  11. Cardiomyopathy: Continue Metoprolol 12.5 mg bid and Crestor.              --aldactone added 06/07-->monitor for orthostatic symptoms.  --continues to have intermittent tachycardia. Monitor for symptoms with increased activity.  12. Ileus/intermittent issues w/N/V: Has been refusing miralax and senna intermittently. Will decrease to Miralax to daily w/Senna S daily             --Keep Mg>2.0 and K>4.0 for adequate supplementation/recurrence.               --K trending down and subtherapeutic today-->will add Kdur 20 TID and check lytes X 2 days             --add Mg for  supplement.  13. Pulmonary nodules: PCCM recommends repeat CT 6-8 weeks from 10/28/21 to monitor for resolution.  14. Interstitial cystitis s/p cystectomy and fistula repair: Followed by Dr.Evans.     LOS: 1 days A FACE TO FACE EVALUATION WAS PERFORMED  Charlett Blake 11/15/2021, 7:53 AM

## 2021-11-15 NOTE — Evaluation (Signed)
Speech Language Pathology Assessment and Plan  Patient Details  Name: [name] MRN: [id number] Date of Birth: 01/24/1956  SLP Diagnosis: Cognitive Impairments  Rehab Potential: Excellent ELOS: 3.5-4 weeks but may be shorter for SLP    Today's Date: 11/15/2021 SLP Individual Time: 1355-1455 SLP Individual Time Calculation (min): 60 min   Hospital Problem: Principal Problem:   Critical illness neuropathy ([place]) Active Problems:   Debility  Past Medical History:  Past Medical History:  Diagnosis Date   Anxiety    Chronic back pain    COPD (chronic obstructive pulmonary disease) ([place])    Interstitial cystitis    Tobacco abuse    Past Surgical History:  Past Surgical History:  Procedure Laterality Date   ABDOMINAL HYSTERECTOMY     BACK SURGERY     ILEO LOOP NEOBLADDER     VESICOVAGINAL FISTULA REPAIR      Assessment / Plan / Recommendation Clinical Impression Patient is a 66 y.o. female with medical history significant for COPD and uncontrolled anxiety. Patient presented to the [facility] ED 09/24/21 with complaints of difficulty breathing, cough, wheezing of about 1 week duration.   Diffuse expiratory wheezing heard in ED.Patient was evaluated by RT in the ED and was placed on BiPAP. 2 view chest x-ray without acute abnormality. Pt ultimately failed BiPAP and required mechanical ventilation, transferred to [facility] 4/21 due to new finding of LV dysfunction Echo 4/20 with EF 40% , akinesis of apex and septal wall. . Failed weaning attempt 4/28. Trach placed on 5/1. Pt. with persistent encephalopathy throughout admission. Initially with NG tube but initiated regular diet 10/18/21. PT/OT/SLP were consulted and recommended CIR to assist return to PLOF. Patient admitted 11/14/21.   Upon arrival, patient had her PMSV in place. Patient currently has a #6 cuffless trach and is tolerating her PMSV during all waking hours. Patient's vitals remained WNL and patient demonstrated adequate  breath support and vocal intensity and was 100% intelligible. Patient declined donning/doffing her PMSV today and reported it was due to mild discomfort with task both physically and emotionally. A formal BSE was not administered as patient has been tolerating her current diet since 10/18/21. However, patient observed with thin liquid via straw while semi-reclined in bed due to pain when sitting upright at 90 degrees without overt s/s of aspiration noted. Patient declined solid textures due to multiple loose stools today and stomach discomfort. Recommend patient continue current diet but patient educated on the importance of proper positioning during PO intake to maximize safety and reduce aspiration risk. SLP administered the Cognistat. Patient scored within the lower limits of normal on all subtests but informally patient demonstrated delayed processing and mild deficits in recall of functional information. Patient would benefit from continued skilled LSP intervention to maximize her cognitive functioning and functional independence prior to discharge.     Skilled Therapeutic Interventions          Administered a cognitive-linguistic evaluation and PMSV evaluation, please see above for details.   SLP Assessment  Patient will need skilled [facility] during CIR admission    Recommendations  Patient may use Passy-Muir Speech Valve: During all waking hours (remove during sleep) PMSV Supervision: Intermittent MD: Please consider changing trach tube to : Smaller size Oral Care Recommendations: Oral care BID Recommendations for Other Services: Neuropsych consult Patient destination: Home Follow up Recommendations:  (TBD) Equipment Recommended: To be determined    SLP Frequency 3 to 5 out of 7 days   SLP  Duration  SLP Intensity  SLP Treatment/Interventions 3.5-4 weeks but may be shorter for SLP  Minumum of 1-2 x/day, 30 to 90 minutes  Cognitive  remediation/compensation;Internal/external aids;Cueing hierarchy;Environmental controls;Therapeutic Activities;Functional tasks;Patient/family education    Pain Pain Assessment Pain Scale: 0-10 Pain Score: 0-No pain  Prior Functioning Type of Home: House  Lives With: Spouse Available Help at Discharge: Family;Available PRN/intermittently (husband has to go to chemo)  SLP Evaluation Cognition Overall Cognitive Status: Impaired/Different from baseline Arousal/Alertness: Awake/alert Orientation Level: Oriented X4 Year: 2024 Attention: Sustained Focused Attention: Appears intact Sustained Attention: Appears intact Memory: Impaired Memory Impairment: Decreased recall of new information;Decreased short term memory Awareness: Appears intact Problem Solving: Impaired Problem Solving Impairment: Functional complex Safety/Judgment: Appears intact  Comprehension Auditory Comprehension Overall Auditory Comprehension: Appears within functional limits for tasks assessed Expression Expression Primary Mode of Expression: Verbal Verbal Expression Overall Verbal Expression: Appears within functional limits for tasks assessed Written Expression Dominant Hand: Right Oral Motor Oral Motor/Sensory Function Overall Oral Motor/Sensory Function: Within functional limits Motor Speech Overall Motor Speech: Appears within functional limits for tasks assessed Intelligibility: Intelligible Word: 75-100% accurate Phrase: 75-100% accurate Sentence: 75-100% accurate Conversation: 75-100% accurate  Care Tool Care Tool Cognition Ability to hear (with hearing aid or hearing appliances if normally used Ability to hear (with hearing aid or hearing appliances if normally used): 0. Adequate - no difficulty in normal conservation, social interaction, listening to TV   Expression of Ideas and Wants Expression of Ideas and Wants: 4. Without difficulty (complex and basic) - expresses complex messages without  difficulty and with speech that is clear and easy to understand   Understanding Verbal and Non-Verbal Content Understanding Verbal and Non-Verbal Content: 4. Understands (complex and basic) - clear comprehension without cues or repetitions  Memory/Recall Ability Memory/Recall Ability : Current season;That he or she is in a hospital/hospital unit   PMSV Assessment  PMSV Trial PMSV was placed for: 60 mins Able to redirect subglottic air through upper airway: Yes Able to Attain Phonation: Yes Voice Quality: Normal Able to Expectorate Secretions: No attempts Level of Secretion Expectoration with PMSV: Not observed Breath Support for Phonation: Adequate Intelligibility: Intelligible Word: 75-100% accurate Phrase: 75-100% accurate Sentence: 75-100% accurate Conversation: 75-100% accurate Respirations During Trial:  (WNL) SpO2 During Trial: 95 % Pulse During Trial: 103 Behavior: Alert;Cooperative;Expresses self well   Short Term Goals: Week 1: SLP Short Term Goal 1 (Week 1): Patient will demonstrate complex problem solving for functional tasks with supervision level verbal cues. SLP Short Term Goal 2 (Week 1): Patient will recal new, daily information with supervision level verbal cues for use of compensatory strategies SLP Short Term Goal 3 (Week 1): Patient will donn/doff PMSV with supervision level verbal and visual cues.  Refer to Care Plan for Long Term Goals  Recommendations for other services: Neuropsych  Discharge Criteria: Patient will be discharged from SLP if patient refuses treatment 3 consecutive times without medical reason, if treatment goals not met, if there is a change in medical status, if patient makes no progress towards goals or if patient is discharged from hospital.  The above assessment, treatment plan, treatment alternatives and goals were discussed and mutually agreed upon: by patient  Mikiyah Glasner 11/15/2021, 3:06 PM

## 2021-11-15 NOTE — Plan of Care (Signed)
  Problem: RH Problem Solving Goal: LTG Patient will demonstrate problem solving for (SLP) Description: LTG:  Patient will demonstrate problem solving for basic/complex daily situations with cues  (SLP) Flowsheets (Taken 11/15/2021 1516) LTG: Patient will demonstrate problem solving for (SLP): Complex daily situations LTG Patient will demonstrate problem solving for: Modified Independent   Problem: RH Memory Goal: LTG Patient will demonstrate ability for day to day (SLP) Description: LTG:   Patient will demonstrate ability for day to day recall/carryover during cognitive/linguistic activities with assist  (SLP) Flowsheets (Taken 11/15/2021 1516) LTG: Patient will demonstrate ability for day to day recall: Daily complex information LTG: Patient will demonstrate ability for day to day recall/carryover during cognitive/linguistic activities with assist (SLP): Modified Independent Goal: LTG Patient will use memory compensatory aids to (SLP) Description: LTG:  Patient will use memory compensatory aids to recall biographical/new, daily complex information with cues (SLP) Flowsheets (Taken 11/15/2021 1516) LTG: Patient will use memory compensatory aids to (SLP): Modified Independent   Problem: RH Pre-functional/Other (Specify) Goal: RH LTG SLP (Specify) 1 Description: Patient will independently donn/doff the PMSV.  Flowsheets (Taken 11/15/2021 1517) LTG: Other SLP (Specify) 1: Patient will independently donn/doff PMSV

## 2021-11-15 NOTE — Evaluation (Signed)
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[place]) Active Problems:   Debility   Past Medical History:  Past Medical History:  Diagnosis Date   Anxiety    Chronic back pain    COPD (chronic obstructive pulmonary disease) ([place])    Interstitial cystitis    Tobacco abuse    Past Surgical History:  Past Surgical History:  Procedure Laterality Date   ABDOMINAL HYSTERECTOMY     BACK SURGERY     ILEO LOOP NEOBLADDER     VESICOVAGINAL FISTULA REPAIR      Assessment & Plan Clinical Impression:  [name] is a 66 year old female with history of COPD--oxygen dependent (but took herself off it), chronic LBP, anxiety d/o w/panic attacks, recent diagnosis of PNA who was admitted on 09/24/21 with hypoxia and tachypnea due to acute hypoxemia hypercarbic respiratory failure. She was placed on BIPAP but continued to worsen with obtundation requiring intubation and sedated with precedex and ketamine to help manage anxiety.     Cardiology consulted for new diagnosis of CM with EF 40% and wall abnormality concerning for ischemic etiology but not a candidate for ischemic work at this time. She had issues with sinus tachycardia but need adjustment of BB due to soft BP.  She needs to recover significantly from current illness and outpatient follow up recommended.     Tracheostomy placed on 05/01 and weaned to ATC with high oxygen needs and severe anxiety issues. Trach changed to CFS #6 on 05/15 and antibiotics added on 05/16 due to increase in respiratory distress/leucocytosis. Tracheal aspirate postive for Klebsiella HAP  and she has completed 10 day course of cefepime on 11/03/21.   She continued to have significant oxygen needs and was weaned to to 6 Liters by 06/05. No plans for decannulation at this time. She is tolerating PMSV during the day.    Hospital course significant for partial SBP v/s ileus 05/24 treated with liquid diet --has history of cystectomy with ileal neobladder and repair of transabdominal vaginal fistula.  Surgery signed off on 05/26 but were reconsulted due to abdominal distension with N/V, pain, constipation and small amount of air seen on KUB. Gastrograffin enema showed persistent gaseous dilatation of bowel with po contrast in gastric lumen and follow up KUB showed majority of contrast in colon without evidence of SBO.   Bowel program augmented with recommendations of daily suppositories, consistent bowel regimen, keeping Mg>2.0 and K>4.0 to optimize bowel function as well as mobility. Bowels function improving, diet advanced to regular on 06/02 and CCS signed off.    She has had issues with leucocytosis--CT chest showed multiple nodules and multiple UAs done for work up. Dr. Tommy Medal consulted and felt that leucocytosis could be secondary to chronic aspiration and as no signs of infection with asymptomatic bacturia. Cryto, histo and blasto Antigens negative and pulmonary recommends repeating chest CT in 6-8 weeks to monitor for resolution. PT/OT has been working with patient who is tolerating attempts at standing with Stedy  with knees blocked, had delay in processing with verbal and tactile cues to follow one step commands with increased time, decreased activity tolerance and critical illness myopathy  affecting ADLs and mobility. CIR recommended due to functional decline.  Currently has no complaints, somnolent.  Patient transferred to CIR on 11/14/2021 .   Patient currently requires total with mobility secondary to muscle weakness and muscle joint tightness, decreased cardiorespiratoy endurance and  decreased oxygen support, decreased memory, and decreased sitting balance, decreased standing balance, decreased postural control, and decreased balance strategies.  Prior to hospitalization, patient was independent  with mobility and lived with Significant other in a House home.  Home access is 3 in front, 1 in garageStairs to enter.  Patient will benefit from skilled PT intervention to maximize safe functional mobility, minimize fall risk, and decrease caregiver burden for planned discharge home with 24 hour assist.  Anticipate patient will benefit from follow up [facility] at discharge.  PT - End of Session Activity Tolerance: Tolerates 30+ min activity with multiple rests Endurance Deficit: Yes Endurance Deficit Description: frequent rest breaks during functional activity PT Assessment Rehab Potential (ACUTE/IP ONLY): Good PT Barriers to Discharge: Decreased caregiver support;Home environment access/layout;Trach;Incontinence;Lack of/limited family support PT Barriers to Discharge Comments: husband to start chemo soon, unsure of other support upon d/c PT Patient demonstrates impairments in the following area(s): Balance;Endurance;Safety PT Transfers Functional Problem(s): Bed Mobility;Bed to Chair;Car;Furniture;Floor PT Locomotion Functional Problem(s): Ambulation;Wheelchair Mobility;Stairs PT Plan PT Intensity: Minimum of 1-2 x/day ,45 to 90 minutes PT Frequency: 5 out of 7 days PT Duration Estimated Length of Stay: 25-28 days PT Treatment/Interventions: Ambulation/gait training;Balance/vestibular training;Cognitive remediation/compensation;Community reintegration;Discharge planning;Disease management/prevention;DME/adaptive equipment instruction;Functional electrical stimulation;Functional mobility training;Neuromuscular re-education;Pain management;Patient/family education;Psychosocial support;Skin care/wound management;Splinting/orthotics;Stair training;Therapeutic Activities;Therapeutic  Exercise;UE/LE Strength taining/ROM;UE/LE Coordination activities;Wheelchair propulsion/positioning PT Transfers Anticipated Outcome(s): min A PT Locomotion Anticipated Outcome(s): min A with LRAD PT Recommendation Recommendations for Other Services: Neuropsych consult;Therapeutic Recreation consult Therapeutic Recreation Interventions: Stress management Follow Up Recommendations: Home health PT;24 hour supervision/assistance Patient destination: Home Equipment Recommended: Wheelchair (measurements);Wheelchair cushion (measurements);Rolling walker with 5" wheels Equipment Details: TBD pending progress   PT Evaluation Precautions/Restrictions Precautions Precautions: Fall Precaution Comments: trach Restrictions Weight Bearing Restrictions: No Pain Pain Assessment Pain Scale: 0-10 Pain Score: 0-No pain Pain Interference Pain Interference Pain Effect on Sleep: 2. Occasionally Pain Interference with Therapy Activities: 2. Occasionally Pain Interference with Day-to-Day Activities: 2. Occasionally Home Living/Prior Functioning Home Living Available Help at Discharge: Family;Available 24 hours/day Type of Home: House Home Access: Stairs to enter [employer] of Steps: 3 in front, 1 in garage Entrance Stairs-Rails: None Home Layout: One level Additional Comments: per pt report husband recently started chemo, not able to provide much physical assist upon d/c  Lives With: Significant other Prior Function Level of Independence: Independent with gait;Independent with transfers  Able to Take Stairs?: Yes Driving: Yes Vision/Perception  Vision - History Baseline Vision: Wears glasses all the time Patient Visual Report: No change from baseline Perception Perception: Within Functional Limits Praxis Praxis: Intact  Cognition Overall Cognitive Status: Impaired/Different from baseline Arousal/Alertness: Awake/alert Orientation Level: Oriented X4 Year: 2024 Attention:  Focused;Sustained Focused Attention: Appears intact Memory: Impaired Memory Impairment: Decreased recall of new information;Decreased short term memory Awareness: Appears intact Problem Solving: Appears intact Safety/Judgment: Appears intact Sensation Sensation Light Touch: Appears Intact Proprioception: Appears Intact Coordination Gross Motor Movements are Fluid and Coordinated: No Fine Motor Movements are Fluid and Coordinated: No Coordination and Movement Description: impaired 2/2 global weakness Heel Shin Test: unable to assess 2/2 BLE weakness Motor  Motor Motor: Abnormal postural alignment and control Motor - Skilled Clinical Observations: impaired 2/2 global weakness  Trunk/Postural Assessment  Cervical Assessment Cervical  Assessment: Exceptions to [facility] (forward head) Thoracic Assessment Thoracic Assessment: Exceptions to [facility] (rounded shoulders) Lumbar Assessment Lumbar Assessment: Exceptions to [facility] (posterior pelvic tilt) Postural Control Postural Control: Deficits on evaluation Righting Reactions: delayed/insufficient  Balance Balance Balance Assessed: Yes Static Sitting Balance Static Sitting - Balance Support: Bilateral upper extremity supported;Feet supported Static Sitting - Level of Assistance: 4: Min assist Dynamic Sitting Balance Dynamic Sitting - Balance Support: No upper extremity supported;Feet supported;During functional activity Dynamic Sitting - Level of Assistance: 4: Min assist Extremity Assessment   RLE Assessment RLE Assessment: Exceptions to [facility] General Strength Comments: impaired, see below RLE Strength Right Hip Flexion: 2-/5 Right Knee Flexion: 3/5 Right Knee Extension: 3-/5 Right Ankle Dorsiflexion: 1/5 LLE Assessment LLE Assessment: Exceptions to [facility] General Strength Comments: impaired, see below LLE Strength Left Hip Flexion: 2-/5 Left Knee Flexion: 2/5 Left Knee Extension: 2/5 Left Ankle Dorsiflexion: 1/5  Care Tool Care Tool  Bed Mobility Roll left and right activity   Roll left and right assist level: Minimal Assistance - Patient > 75%    Sit to lying activity   Sit to lying assist level: Maximal Assistance - Patient 25 - 49%    Lying to sitting on side of bed activity   Lying to sitting on side of bed assist level: the ability to move from lying on the back to sitting on the side of the bed with no back support.: 2 Helpers     Care Tool Transfers Sit to stand transfer   Sit to stand assist level: 2 Helpers    Chair/bed transfer   Chair/bed transfer assist level: 2 Pension scheme manager transfer   Assist Level: Minimal Assistance - Patient > 75% (bed pan)    Scientist, product/process development transfer activity did not occur: Safety/medical concerns        Care Tool Locomotion Ambulation Ambulation activity did not occur: Safety/medical concerns        Walk 10 feet activity Walk 10 feet activity did not occur: Safety/medical concerns       Walk 50 feet with 2 turns activity Walk 50 feet with 2 turns activity did not occur: Safety/medical concerns      Walk 150 feet activity Walk 150 feet activity did not occur: Safety/medical concerns      Walk 10 feet on uneven surfaces activity Walk 10 feet on uneven surfaces activity did not occur: Safety/medical concerns      Stairs Stair activity did not occur: Safety/medical concerns        Walk up/down 1 step activity Walk up/down 1 step or curb (drop down) activity did not occur: Safety/medical concerns      Walk up/down 4 steps activity Walk up/down 4 steps activity did not occur: Safety/medical concerns      Walk up/down 12 steps activity Walk up/down 12 steps activity did not occur: Safety/medical concerns      Pick up small objects from floor Pick up small object from the floor (from standing position) activity did not occur: Safety/medical concerns      Wheelchair Is the patient using a wheelchair?: Yes (not tested during eval due to safety) Type of  Wheelchair: Manual Wheelchair activity did not occur: Safety/medical concerns      Wheel 50 feet with 2 turns activity Wheelchair 50 feet with 2 turns activity did not occur: Safety/medical concerns    Wheel 150 feet activity Wheelchair 150 feet activity did not occur: Safety/medical concerns      Refer to  Care Plan for Long Term Goals  SHORT TERM GOAL WEEK 1 PT Short Term Goal 1 (Week 1): Pt will complete bed mobility with mod A x 1 PT Short Term Goal 2 (Week 1): Pt will complete least restrictive transfer with assist x 1 PT Short Term Goal 3 (Week 1): Pt will initiate gait training as safe and able PT Short Term Goal 4 (Week 1): Pt will initiate w/c mobility as safe and able  Recommendations for other services: Neuropsych and Therapeutic Recreation  Stress management  Skilled Therapeutic Intervention Evaluation completed (see details above and below) with education on PT POC and goals and individual treatment initiated with focus on setting pt up with appropriate equipment to be utilized during rehab stay, orientation to rehab unit and schedule, and assessing functional mobility as able. Pt received seated in bed, agreeable to PT evaluation. Pt reports incontinence, requesting to be changed prior to getting up. Rolling L/R with min A and use of bedrails for dependent pericare and brief change following bowel incontinence. Pt able to further void bowel and urine on bedpan. Supine to sit with assist x 2 for BLE management and trunk elevation. Pt tolerates sitting EOB x 5 min with close Supervision to min A needed at times. Pt reports bowel incontinence again while seated EOB. Pt returned to supine with max A for BLE management and trunk control. Rolling L/R with min A for dependent brief change and pericare. Then set pt up for breakfast at bed level, pt reports urge to toilet again. Placed bedpan under patient and left call bell in reach to alert nursing once done toileting. Alerted nursing of  frequent, loose stools throughout session. Pt left seated in bed with needs in reach. Pt on 8L O2 via trach collar at FiO2 35%. SpO2 drops to 83% while seated EOB, returns to 90% (+) once back in semi-reclined position.  Mobility Bed Mobility Bed Mobility: Rolling Right;Rolling Left;Supine to Sit;Sit to Supine Rolling Right: Minimal Assistance - Patient > 75% Rolling Left: Minimal Assistance - Patient > 75% Supine to Sit: 2 Helpers Sit to Supine: Maximal Assistance - Patient 25-49% Transfers Transfers: Sit to [employer] Transfers Sit to Stand: 2 Helpers Set designer Transfers: 2 Press photographer (Assistive device): Database administrator / Additional Locomotion Stairs: No Wheelchair Mobility Wheelchair Mobility: No   Discharge Criteria: Patient will be discharged from PT if patient refuses treatment 3 consecutive times without medical reason, if treatment goals not met, if there is a change in medical status, if patient makes no progress towards goals or if patient is discharged from hospital.  The above assessment, treatment plan, treatment alternatives and goals were discussed and mutually agreed upon: by patient   Excell Seltzer, PT, DPT, CSRS 11/15/2021, 12:14 PM

## 2021-11-15 NOTE — Plan of Care (Signed)
  Problem: RH Balance Goal: LTG Patient will maintain dynamic sitting balance (PT) Description: LTG:  Patient will maintain dynamic sitting balance with assistance during mobility activities (PT) Flowsheets (Taken 11/15/2021 1223) LTG: Pt will maintain dynamic sitting balance during mobility activities with:: Independent with assistive device    Problem: Sit to Stand Goal: LTG:  Patient will perform sit to stand with assistance level (PT) Description: LTG:  Patient will perform sit to stand with assistance level (PT) Flowsheets (Taken 11/15/2021 1223) LTG: PT will perform sit to stand in preparation for functional mobility with assistance level: Minimal Assistance - Patient > 75%   Problem: RH Bed Mobility Goal: LTG Patient will perform bed mobility with assist (PT) Description: LTG: Patient will perform bed mobility with assistance, with/without cues (PT). Flowsheets (Taken 11/15/2021 1223) LTG: Pt will perform bed mobility with assistance level of: Minimal Assistance - Patient > 75%   Problem: RH Bed to Chair Transfers Goal: LTG Patient will perform bed/chair transfers w/assist (PT) Description: LTG: Patient will perform bed to chair transfers with assistance (PT). Flowsheets (Taken 11/15/2021 1223) LTG: Pt will perform Bed to Chair Transfers with assistance level: Minimal Assistance - Patient > 75%   Problem: RH Car Transfers Goal: LTG Patient will perform car transfers with assist (PT) Description: LTG: Patient will perform car transfers with assistance (PT). Flowsheets (Taken 11/15/2021 1223) LTG: Pt will perform car transfers with assist:: Minimal Assistance - Patient > 75%   Problem: RH Ambulation Goal: LTG Patient will ambulate in controlled environment (PT) Description: LTG: Patient will ambulate in a controlled environment, # of feet with assistance (PT). Flowsheets (Taken 11/15/2021 1223) LTG: Pt will ambulate in controlled environ  assist needed:: Minimal Assistance - Patient >  75% LTG: Ambulation distance in controlled environment: 50 ft with LRAD Goal: LTG Patient will ambulate in home environment (PT) Description: LTG: Patient will ambulate in home environment, # of feet with assistance (PT). Flowsheets (Taken 11/15/2021 1223) LTG: Pt will ambulate in home environ  assist needed:: Minimal Assistance - Patient > 75% LTG: Ambulation distance in home environment: 25 ft with LRAD   Problem: RH Wheelchair Mobility Goal: LTG Patient will propel w/c in controlled environment (PT) Description: LTG: Patient will propel wheelchair in controlled environment, # of feet with assist (PT) Flowsheets (Taken 11/15/2021 1223) LTG: Pt will propel w/c in controlled environ  assist needed:: Independent with assistive device LTG: Propel w/c distance in controlled environment: 150 ft Goal: LTG Patient will propel w/c in home environment (PT) Description: LTG: Patient will propel wheelchair in home environment, # of feet with assistance (PT). Flowsheets (Taken 11/15/2021 1223) LTG: Pt will propel w/c in home environ  assist needed:: Independent with assistive device LTG: Propel w/c distance in home environment: 75 ft

## 2021-11-15 NOTE — Progress Notes (Signed)
Inpatient Rehabilitation  Patient information reviewed and entered into eRehab system by Gussie Murton Wylene Weissman, OTR/L.   Information including medical coding, functional ability and quality indicators will be reviewed and updated through discharge.    

## 2021-11-15 NOTE — Progress Notes (Signed)
Patient with liquid stools. Had soft BM last night-->will d/c miralax and continue Senna S at bedtime to prevent recurrent ileus. Still awaiting CMET today.

## 2021-11-15 NOTE — Plan of Care (Signed)
  Problem: RH Balance Goal: LTG Patient will maintain dynamic standing with ADLs (OT) Description: LTG:  Patient will maintain dynamic standing balance with assist during activities of daily living (OT)  Flowsheets (Taken 11/15/2021 1027) LTG: Pt will maintain dynamic standing balance during ADLs with: Minimal Assistance - Patient > 75%   Problem: Sit to Stand Goal: LTG:  Patient will perform sit to stand in prep for activites of daily living with assistance level (OT) Description: LTG:  Patient will perform sit to stand in prep for activites of daily living with assistance level (OT) Flowsheets (Taken 11/15/2021 1027) LTG: PT will perform sit to stand in prep for activites of daily living with assistance level: Contact Guard/Touching assist   Problem: RH Eating Goal: LTG Patient will perform eating w/assist, cues/equip (OT) Description: LTG: Patient will perform eating with assist, with/without cues using equipment (OT) Flowsheets (Taken 11/15/2021 1027) LTG: Pt will perform eating with assistance level of: Independent with assistive device    Problem: RH Grooming Goal: LTG Patient will perform grooming w/assist,cues/equip (OT) Description: LTG: Patient will perform grooming with assist, with/without cues using equipment (OT) Flowsheets (Taken 11/15/2021 1027) LTG: Pt will perform grooming with assistance level of: Independent with assistive device    Problem: RH Bathing Goal: LTG Patient will bathe all body parts with assist levels (OT) Description: LTG: Patient will bathe all body parts with assist levels (OT) Flowsheets (Taken 11/15/2021 1027) LTG: Pt will perform bathing with assistance level/cueing: Supervision/Verbal cueing   Problem: RH Dressing Goal: LTG Patient will perform upper body dressing (OT) Description: LTG Patient will perform upper body dressing with assist, with/without cues (OT). Flowsheets (Taken 11/15/2021 1027) LTG: Pt will perform upper body dressing with assistance  level of: Independent Goal: LTG Patient will perform lower body dressing w/assist (OT) Description: LTG: Patient will perform lower body dressing with assist, with/without cues in positioning using equipment (OT) Flowsheets (Taken 11/15/2021 1027) LTG: Pt will perform lower body dressing with assistance level of: Minimal Assistance - Patient > 75%   Problem: RH Toileting Goal: LTG Patient will perform toileting task (3/3 steps) with assistance level (OT) Description: LTG: Patient will perform toileting task (3/3 steps) with assistance level (OT)  Flowsheets (Taken 11/15/2021 1027) LTG: Pt will perform toileting task (3/3 steps) with assistance level: Minimal Assistance - Patient > 75%   Problem: RH Toilet Transfers Goal: LTG Patient will perform toilet transfers w/assist (OT) Description: LTG: Patient will perform toilet transfers with assist, with/without cues using equipment (OT) Flowsheets (Taken 11/15/2021 1027) LTG: Pt will perform toilet transfers with assistance level of: Contact Guard/Touching assist   Problem: RH Tub/Shower Transfers Goal: LTG Patient will perform tub/shower transfers w/assist (OT) Description: LTG: Patient will perform tub/shower transfers with assist, with/without cues using equipment (OT) Flowsheets (Taken 11/15/2021 1027) LTG: Pt will perform tub/shower stall transfers with assistance level of: Contact Guard/Touching assist   Problem: RH Furniture Transfers Goal: LTG Patient will perform furniture transfers w/assist (OT/PT) Description: LTG: Patient will perform furniture transfers  with assistance (OT/PT). Flowsheets (Taken 11/15/2021 1027) LTG: Pt will perform furniture transfers with assist:: Contact Guard/Touching assist

## 2021-11-15 NOTE — Progress Notes (Signed)
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 Her smoking use included cigarettes. She has a 90.00 pack-year smoking history. She has never used smokeless tobacco. She reports that she does not drink alcohol and does not use drugs.  Family / Support Systems Spouse/Significant Other: Rosana Berger Other Supports: Salley Hews Anticipated Caregiver: Sister and other family memebers. Spouse recently has a stroke unable to provide physical assistance Ability/Limitations of Caregiver: none Caregiver Availability: 24/7 Family Dynamics: support from sister and other family  Social History Preferred language: English Religion: [employer] - How often do you need to have someone help you when you read instructions, pamphlets, or other written material from your doctor or pharmacy?: Sometimes Writes: Yes Legal History/Current Legal Issues: n/a Guardian/Conservator: n/a   Abuse/Neglect Abuse/Neglect Assessment Can Be Completed: Yes Physical Abuse: Denies Verbal Abuse: Denies Sexual Abuse: Denies Exploitation of patient/patient's resources: Denies Self-Neglect: Denies  Patient response to: Social Isolation - How often do you feel lonely or isolated from those around you?: Never  Emotional Status Recent  Psychosocial Issues: coping, anxiety Psychiatric History: uncontrolled anxiety Substance Abuse History: tobacco abuse  Patient / Family Perceptions, Expectations & Goals Pt/Family understanding of illness & functional limitations: yes Premorbid pt/family roles/activities: previosuly independent Anticipated changes in roles/activities/participation: Patient will require assistance at d/c from patient sister and family Pt/family expectations/goals: [place]: None Premorbid Home Care/DME Agencies: None Transportation available at discharge: Family able to transport Is the patient able to respond to transportation needs?: Yes In the past 12 months, has lack of transportation kept you from medical appointments or from getting medications?: No In the past 12 months, has lack of transportation kept you from meetings, work, or from getting things needed for daily living?: No Resource referrals recommended: Neuropsychology  Discharge Planning Living Arrangements: Spouse/significant other Support Systems: Spouse/significant other, Other relatives Type of Residence: Private residence Insurance Resources: Multimedia programmer (specify) Forensic psychologist Medicare) Museum/gallery curator Resources: Family Support Financial Screen Referred: No Living Expenses: Lives with family Does the patient have any problems obtaining your medications?: No Home Management: Independent Patient/Family Preliminary Plans: Family able to assist with cognitive tasks Care Coordinator Barriers to Discharge: Lack of/limited family support, Inaccessible home environment, Decreased caregiver support Care Coordinator Anticipated Follow Up Needs: [place] Additional Notes/Comments: Trach, [place] Expected length of stay: 16-18 Days  Clinical Impression SW spoke with patient spouse and sister, introduced self and explained role. Patient sister reports that she will be present at the hospital today.  Patient discharging home with assistance from sister, spouse recently had a stroke. No additional questions or concerns, sw will continue to follow up.   Dyanne Iha 11/15/2021, 1:27 PM

## 2021-11-15 NOTE — Evaluation (Signed)
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 year old female with history of COPD--oxygen dependent (but took herself off it), chronic LBP, anxiety d/o w/panic attacks, recent diagnosis of PNA who was admitted on 09/24/21 with hypoxia and tachypnea due to acute hypoxemia hypercarbic respiratory failure. She was placed on BIPAP but continued to worsen with obtundation requiring intubation and sedated with precedex and ketamine to help manage anxiety.     Cardiology consulted for new diagnosis of CM with EF 40% and wall abnormality concerning for ischemic etiology but not a candidate for ischemic work at this time. She had issues with sinus tachycardia but need adjustment of BB due to soft BP.  She needs to recover significantly from current illness and outpatient follow up recommended.     Tracheostomy placed on 05/01 and weaned to ATC with high oxygen needs and severe anxiety issues. Trach changed to CFS #6 on 05/15 and antibiotics added on 05/16 due to increase in respiratory  distress/leucocytosis. Tracheal aspirate postive for Klebsiella HAP and she has completed 10 day course of cefepime on 11/03/21.   She continued to have significant oxygen needs and was weaned to to 6 Liters by 06/05. No plans for decannulation at this time. She is tolerating PMSV during the day.    Hospital course significant for partial SBP v/s ileus 05/24 treated with liquid diet --has history of cystectomy with ileal neobladder and repair of transabdominal vaginal fistula.  Surgery signed off on 05/26 but were reconsulted due to abdominal distension with N/V, pain, constipation and small amount of air seen on KUB. Gastrograffin enema showed persistent gaseous dilatation of bowel with po contrast in gastric lumen and follow up KUB showed majority of contrast in colon without evidence of SBO.   Bowel program augmented with recommendations of daily suppositories, consistent bowel regimen, keeping Mg>2.0 and K>4.0 to optimize bowel function as well as mobility. Bowels function improving, diet advanced to regular on 06/02 and CCS signed off.    She has had issues with leucocytosis--CT chest showed multiple nodules and multiple UAs done for work up. Dr. Tommy Medal consulted and felt that leucocytosis could be secondary to chronic aspiration and as no signs of infection with asymptomatic bacturia. Cryto, histo and blasto Antigens negative and pulmonary recommends repeating chest CT in 6-8 weeks to monitor for resolution. 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 CIR recommended due to functional decline.  Currently has no complaints, somnolent. .  Patient transferred to CIR on 11/14/2021 .    Patient currently requires  S to total A of 2  with basic self-care skills  secondary to muscle weakness, decreased cardiorespiratoy endurance, impaired timing and sequencing, unbalanced muscle activation, and decreased coordination, decreased memory, and decreased standing balance, decreased postural control, and decreased balance strategies.  Prior to hospitalization, patient could complete BADL/mobility with independent .  Patient will benefit from skilled intervention to decrease level of assist with basic self-care skills and increase independence with basic self-care skills prior to discharge home with care partner.  Anticipate patient will require intermittent supervision and minimal physical assistance and follow up home health and follow up outpatient.  OT - End of Session Activity Tolerance: Tolerates 10 - 20 min activity with multiple rests Endurance Deficit: Yes Endurance Deficit Description: frequent rest breaks during functional activity OT Assessment Rehab Potential (ACUTE ONLY): Good OT Barriers to Discharge: Decreased caregiver support;Trach OT Patient demonstrates impairments in the following area(s): Balance;Cognition;Endurance;Motor OT Basic ADL's Functional Problem(s): Eating;Grooming;Bathing;Dressing;Toileting OT Transfers Functional Problem(s): Toilet;Tub/Shower OT Additional Impairment(s): Fuctional Use of Upper Extremity OT Plan OT Intensity: Minimum of 1-2 x/day, 45 to 90 minutes OT Frequency: 5 out of 7 days OT Duration/Estimated Length of Stay: 3.5-4 weeks OT Treatment/Interventions: Balance/vestibular training;Neuromuscular re-education;Cognitive remediation/compensation;DME/adaptive equipment instruction;UE/LE Strength taining/ROM;Therapeutic Exercise;Self Care/advanced ADL retraining;Wheelchair propulsion/positioning;UE/LE Coordination activities;Community reintegration;Patient/family education;Discharge planning;Functional mobility training;Psychosocial support;Therapeutic Activities OT Self Feeding Anticipated Outcome(s): mod I OT Basic  Self-Care Anticipated Outcome(s): mod I to min A OT Toileting Anticipated Outcome(s): min A OT Bathroom Transfers Anticipated Outcome(s): CGA OT Recommendation Recommendations for Other Services: Therapeutic Recreation consult Therapeutic Recreation Interventions: Pet therapy;Stress management Patient destination: Home Follow Up Recommendations: Home health OT;Outpatient OT Equipment Recommended: To be determined   OT Evaluation Precautions/Restrictions  Precautions Precautions: Fall Precaution Comments: trach, B foot drop Restrictions Weight Bearing Restrictions: No General Chart Reviewed: Yes Response to Previous Treatment: Patient reporting fatigue but able to participate Family/Caregiver Present: No  Pain Pain Assessment Pain Scale: 0-10 Pain Score: 0-No pain Home Living/Prior Functioning Home Living Available Help at Discharge: Family, Available PRN/intermittently (husband has to go to chemo) Type of Home: House Home Access: Stairs to enter [employer] of Steps: 3 in front, 1 in garage Entrance Stairs-Rails: None Home Layout: One level Bathroom Shower/Tub: Gaffer, Chiropodist: Handicapped height Bathroom Accessibility: Yes Additional Comments: per pt report husband recently started chemo, not able to provide much physical assist upon d/c  Lives With: Spouse IADL History Homemaking Responsibilities: No Occupation: On disability Type of Occupation: Dealer Leisure and Hobbies: watching/feeding the wildlife in her yard Prior Function Level of Independence: Independent with gait, Independent with transfers, Independent with basic ADLs  Able to Take Stairs?: Yes Driving: Yes Vision Baseline Vision/History: 1 Wears glasses (bifocals, wears them all the time) Ability to See in Adequate Light: 0 Adequate Patient Visual Report: No change from baseline Vision Assessment?: No apparent visual deficits Perception  Perception:  Within Functional Limits Praxis Praxis: Intact Cognition Cognition Overall Cognitive Status: Impaired/Different from baseline Arousal/Alertness: Awake/alert Orientation Level: Person;Place;Situation Person: Oriented Place: Oriented Situation: Oriented Memory: Appears intact Memory Impairment: Decreased recall of new information;Decreased short term memory Attention: Sustained Focused Attention: Appears intact Sustained Attention: Appears intact Awareness: Appears intact Problem Solving: Appears intact Safety/Judgment: Appears intact Brief Interview for Mental Status (BIMS) Repetition of Three Words (  First Attempt): 3 Temporal Orientation: Year: Correct Temporal Orientation: Month: Accurate within 5 days Temporal Orientation: Day: Correct Recall: "Sock": No, could not recall Recall: "Blue": Yes, no cue required Recall: "Bed": Yes, no cue required BIMS Summary Score: 13 Sensation Sensation Light Touch: Appears Intact Hot/Cold: Appears Intact Proprioception: Appears Intact Stereognosis: Appears Intact Coordination Gross Motor Movements are Fluid and Coordinated: No Fine Motor Movements are Fluid and Coordinated: No Coordination and Movement Description: impaired 2/2 global weakness Finger Nose Finger Test: slowed but WFL Heel Shin Test: unable to assess 2/2 BLE weakness Motor  Motor Motor: Abnormal postural alignment and control Motor - Skilled Clinical Observations: impaired 2/2 global weakness  Trunk/Postural Assessment  Cervical Assessment Cervical Assessment: Exceptions to [facility] (forward head) Thoracic Assessment Thoracic Assessment: Exceptions to [facility] (rounded shoulders) Lumbar Assessment Lumbar Assessment: Exceptions to [facility] (posterior pelvic tilt) Postural Control Postural Control: Deficits on evaluation Righting Reactions: delayed/insufficient  Balance Balance Balance Assessed: Yes Static Sitting Balance Static Sitting - Balance Support: Bilateral upper  extremity supported;Feet supported Static Sitting - Level of Assistance: 5: Stand by assistance;4: Min assist Dynamic Sitting Balance Dynamic Sitting - Balance Support: No upper extremity supported;Feet supported;During functional activity Dynamic Sitting - Level of Assistance: 4: Min assist;3: Mod assist Theatre stage manager Standing - Balance Support: Bilateral upper extremity supported Static Standing - Level of Assistance: 1: +2 Total assist Extremity/Trunk Assessment RUE Assessment RUE Assessment: Exceptions to [facility] General Strength Comments: generalized weakness, 4-/5 LUE Assessment LUE Assessment: Exceptions to [facility] General Strength Comments: generalized weakness, 4-/5  Care Tool Care Tool Self Care Eating   Eating Assist Level: Set up assist    Oral Care    Oral Care Assist Level: Supervision/Verbal cueing    Bathing   Body parts bathed by patient: Face     Assist Level: Supervision/Verbal cueing    Upper Body Dressing(including orthotics)   What is the patient wearing?: Pull over shirt   Assist Level: Minimal Assistance - Patient > 75%    Lower Body Dressing (excluding footwear)   What is the patient wearing?: Pants Assist for lower body dressing: 2 Helpers    Putting on/Taking off footwear   What is the patient wearing?: Non-skid slipper socks Assist for footwear: Total Assistance - Patient < 25%       Care Tool Toileting Toileting activity   Assist for toileting: Total Assistance - Patient < 25%     Care Tool Bed Mobility Roll left and right activity   Roll left and right assist level: Minimal Assistance - Patient > 75%    Sit to lying activity   Sit to lying assist level: Maximal Assistance - Patient 25 - 49%    Lying to sitting on side of bed activity   Lying to sitting on side of bed assist level: the ability to move from lying on the back to sitting on the side of the bed with no back support.: 2 Helpers     Care Tool Transfers Sit  to stand transfer   Sit to stand assist level: 2 Helpers    Chair/bed transfer   Chair/bed transfer assist level: 2 Helpers     Toilet transfer   Assist Level: Minimal Assistance - Patient > 75% (bed pan)     Care Tool Cognition  Expression of Ideas and Wants Expression of Ideas and Wants: 4. Without difficulty (complex and basic) - expresses complex messages without difficulty and with speech that is clear and easy to understand  Understanding Verbal  and Non-Verbal Content Understanding Verbal and Non-Verbal Content: 4. Understands (complex and basic) - clear comprehension without cues or repetitions   Memory/Recall Ability Memory/Recall Ability : Current season;That he or she is in a hospital/hospital unit   Refer to Care Plan for [place] 1 OT Short Term Goal 1 (Week 1): Pt will complete toilet transfer with max A and LRAD. OT Short Term Goal 2 (Week 1): Pt will complete sit to stand with min A of 2 and LRAD in prep for standing ADL. OT Short Term Goal 3 (Week 1): Pt will don pants with max A of 1.  Recommendations for other services: Therapeutic Recreation  Pet therapy and Stress management   Skilled Therapeutic Intervention ADL ADL Eating: Set up Where Assessed-Eating: Bed level Grooming: Setup Where Assessed-Grooming: Sitting at sink Upper Body Bathing: Supervision/safety Where Assessed-Upper Body Bathing: Sitting at sink Lower Body Bathing: Maximal assistance Where Assessed-Lower Body Bathing: Standing at sink Upper Body Dressing: Minimal assistance Where Assessed-Upper Body Dressing: Sitting at sink Lower Body Dressing: Maximal assistance (+2) Where Assessed-Lower Body Dressing: Edge of bed Toileting: Maximal assistance Where Assessed-Toileting: Bed level Toilet Transfer: Other (comment);Minimal assistance (bed pan) Tub/Shower Transfer: Unable to assess Gaffer Transfer: Unable to assess Mobility  Bed Mobility Bed Mobility:  Rolling Left;Sit to Supine;Scooting to [facility];Supine to Sit Rolling Right: Minimal Assistance - Patient > 75% Rolling Left: Minimal Assistance - Patient > 75% Supine to Sit: Maximal Assistance - Patient - Patient 25-49% Sit to Supine: 2 Helpers Scooting to [facility]: 2 Helpers Transfers Sit to Stand: 2 Helpers Stand to Sit: 2 Helpers  Session Note : Pt received semi-reclined in bed, no c/o pain but endorses fatigue/dizziness, but agreeable to OT eval. Reviewed role of CIR OT, evaluation process, ADL/func mobility retraining, goals for therapy, and safety plan. Evaluation completed as documented above. Came to sitting EOB with max A of 1 to progress BLE and to lift trunk. Close S for static sitting balance. C/o increased dizziness, BP read at 119/64 (79), later reports improvement in dizziness sx. Total A of 2 to don pants, but able to come into standing with mod A of 2 with use of RW. Demos flexed posture and difficulty extending hips/trunk. Squat-pivot to and from w/c with max A of 2 to lift and pivot hips. Pt completed oral care and grooming at sink with S, assist to brush hair in back. Min A to pull shirt down in back when donning.   Requesting to get back to bed due to fatigue. Reports need for BM. Able to roll L with min A to place bed pan, total A for LB clothing management. No void.  Set-up A in bed to eat breakfast.  SatO2 at 90-92% via trach collar on 8L, HR 112 bpm with activity.   Pt left semi-reclined in bed with phleb team present, call bell in reach, and all immediate needs met.  Discharge Criteria: Patient will be discharged from OT if patient refuses treatment 3 consecutive times without medical reason, if treatment goals not met, if there is a change in medical status, if patient makes no progress towards goals or if patient is discharged from hospital.  The above assessment, treatment plan, treatment alternatives and goals were discussed and mutually agreed upon: by patient  Volanda Napoleon MS, OTR/L  11/15/2021, 12:45 PM

## 2021-11-15 NOTE — Progress Notes (Signed)
Inpatient Rehabilitation Center Individual Statement of Services  Patient Name:  IRANIA DURELL  Date:  11/15/2021  Welcome to the Inpatient Rehabilitation Center.  Our goal is to provide you with an individualized program based on your diagnosis and situation, designed to meet your specific needs.  With this comprehensive rehabilitation program, you will be expected to participate in at least 3 hours of rehabilitation therapies Monday-Friday, with modified therapy programming on the weekends.  Your rehabilitation program will include the following services:  Physical Therapy (PT), Occupational Therapy (OT), Speech Therapy (ST), 24 hour per day rehabilitation nursing, Therapeutic Recreaction (TR), Neuropsychology, Care Coordinator, Rehabilitation Medicine, Nutrition Services, Pharmacy Services, and Other  Weekly team conferences will be held on Wednesdays to discuss your progress.  Your Inpatient Rehabilitation Care Coordinator will talk with you frequently to get your input and to update you on team discussions.  Team conferences with you and your family in attendance may also be held.  Expected length of stay: 16-18 Days  Overall anticipated outcome:  MOD A  Depending on your progress and recovery, your program may change. Your Inpatient Rehabilitation Care Coordinator will coordinate services and will keep you informed of any changes. Your Inpatient Rehabilitation Care Coordinator's name and contact numbers are listed  below.  The following services may also be recommended but are not provided by the Inpatient Rehabilitation Center:   Home Health Rehabiltiation Services Outpatient Rehabilitation Services    Arrangements will be made to provide these services after discharge if needed.  Arrangements include referral to agencies that provide these services.  Your insurance has been verified to be:   [employer] Your primary doctor is:  Dietrich Pates, MD  Pertinent information will be shared  with your doctor and your insurance company.  Inpatient Rehabilitation Care Coordinator:  Lavera Guise, [place] [phone] or [phone]  Information discussed with and copy given to patient by: Andria Rhein, 11/15/2021, 12:28 PM

## 2021-11-15 NOTE — Progress Notes (Signed)
Reported to have thick  white vaginal discharge--will add diflucan X 5 days. Also, patient reporting abdominal discomfort w/distension and bloating. Simethicone not effective. Did have drop in potasium levels. Will check KUB and offered option of downgrading diet again.

## 2021-11-16 ENCOUNTER — Ambulatory Visit: Payer: Medicare Other | Admitting: Student

## 2021-11-16 ENCOUNTER — Inpatient Hospital Stay (HOSPITAL_COMMUNITY): Payer: Medicare Other

## 2021-11-16 LAB — MAGNESIUM: Magnesium: 2 mg/dL (ref 1.7–2.4)

## 2021-11-16 LAB — BASIC METABOLIC PANEL
Anion gap: 8 (ref 5–15)
BUN: 8 mg/dL (ref 8–23)
CO2: 29 mmol/L (ref 22–32)
Calcium: 8.5 mg/dL — ABNORMAL LOW (ref 8.9–10.3)
Chloride: 97 mmol/L — ABNORMAL LOW (ref 98–111)
Creatinine, Ser: <0.3 mg/dL — ABNORMAL LOW (ref 0.44–1.00)
Glucose, Bld: 91 mg/dL (ref 70–99)
Potassium: 4.2 mmol/L (ref 3.5–5.1)
Sodium: 134 mmol/L — ABNORMAL LOW (ref 135–145)

## 2021-11-16 LAB — GLUCOSE, CAPILLARY: Glucose-Capillary: 126 mg/dL — ABNORMAL HIGH (ref 70–99)

## 2021-11-16 MED ORDER — METOCLOPRAMIDE HCL 5 MG PO TABS
5.0000 mg | ORAL_TABLET | Freq: Four times a day (QID) | ORAL | Status: DC
  Administered 2021-11-16 – 2021-11-17 (×3): 5 mg via ORAL
  Filled 2021-11-16 (×3): qty 1

## 2021-11-16 MED ORDER — BOOST / RESOURCE BREEZE PO LIQD CUSTOM
1.0000 | Freq: Three times a day (TID) | ORAL | Status: DC
  Administered 2021-11-16 – 2021-11-19 (×4): 1 via ORAL

## 2021-11-16 NOTE — Progress Notes (Signed)
Patient ID: [name], female   DOB: Nov 07, 1955, 66 y.o.   MRN: [id number] Met with the patient to review rehab process, team conference and plan of care. Discussed home support at discharge; patient reports spouse is older and currently going through chemo treatments. Concerned about ability to provide assist at discharge as she was helping him prior to health event. Discussed previous health issues including COPD, HTN and HLD. Currently has a stage 2 to the coccyx and #6 cuffless trach w PMSV. Concern for ileus ; advanced to clear liquid diet breakfast after report of gas/BM yesterday. Continue to follow along to discharge to address educational needs to facilitate preparation for discharge home with spouse; sister to assist as well. Margarito Liner

## 2021-11-16 NOTE — Progress Notes (Signed)
Physical Therapy Session Note  Patient Details  Name: [name] MRN: [id number] Date of Birth: 08-May-1956  Today's Date: 11/16/2021 PT Individual Time: 1355-1505 PT Individual Time Calculation (min): 70 min   Short Term Goals: Week 1:  PT Short Term Goal 1 (Week 1): Pt will complete bed mobility with mod A x 1 PT Short Term Goal 2 (Week 1): Pt will complete least restrictive transfer with assist x 1 PT Short Term Goal 3 (Week 1): Pt will initiate gait training as safe and able PT Short Term Goal 4 (Week 1): Pt will initiate w/c mobility as safe and able   Skilled Therapeutic Interventions/Progress Updates:   Pt received supine in bed and agreeable to PT at bed level.   PT instructed pt in supine therex:  hip/knee flexion/extension, heel slide, SLR, SAQ quad set, clam shells, isometric adduction. Each performed 2 x 10 with therapeutic rest breaks due to fatigue throughout. Cues for hold at end range and decreased speed to eccentric movement with AAROM throughout all exercises.   PT performed heel cord/soleus stretch 3 x 30 sec each Bil. HS stretch 3 x 30  bil. Hip ER/IE 3 x 30sec hold each, piriformis stretch 2 x 30 sec.  Rest break between bouts with mild soreness . Increased ROM noted for IR on the LLE.   Pt left in bed with call bell in reach.        Therapy Documentation Precautions:  Precautions Precautions: Fall Precaution Comments: trach, B foot drop Restrictions Weight Bearing Restrictions: No  Vital Signs: Therapy Vitals Temp: 97.6 F (36.4 C) Temp Source: Oral Pulse Rate: 93 Resp: 14 BP: 107/61 Patient Position (if appropriate): Lying Oxygen Therapy SpO2: 96 % O2 Device: Tracheostomy Collar Pain: Pain Assessment Pain Scale: 0-10 Pain Score: 0-No pain    Therapy/Group: Individual Therapy  Golden Pop 11/16/2021, 3:28 PM

## 2021-11-16 NOTE — Progress Notes (Signed)
PROGRESS NOTE   Subjective/Complaints:  Hungry wants to at least drink somethin , no N/V, +passing flatus and 3 stools yesterday  Discussed KUB result   ROS- neg CP, SOB, N/V/D  Objective:   DG Abd 1 View  Result Date: 11/15/2021 CLINICAL DATA:  Abdominal distension EXAM: ABDOMEN - 1 VIEW COMPARISON:  11/07/2021 FINDINGS: Considerable contrast material is noted throughout the colon slightly improved when compare with the prior exam. This suggest extremely delayed transit time and findings of constipation. Multiple dilated loops of small bowel are seen. Degenerative changes of lumbar spine are noted. IMPRESSION: Findings consistent with colonic constipation with significant persistent contrast within the colon from several days ago. Increasing small bowel dilatation consistent with worsening small bowel obstruction. No free air is noted. Electronically Signed   By: Inez Catalina M.D.   On: 11/15/2021 19:40   Recent Labs    11/14/21 0606 11/15/21 1045  WBC 15.4* 14.9*  HGB 9.6* 10.0*  HCT 29.8* 32.0*  PLT 350 426*    Recent Labs    11/14/21 0606 11/15/21 1045  NA 131* 135  K 3.4* 3.9  CL 90* 95*  CO2 32 33*  GLUCOSE 94 110*  BUN 9 10  CREATININE 0.31* 0.32*  CALCIUM 8.3* 8.2*     Intake/Output Summary (Last 24 hours) at 11/16/2021 0725 Last data filed at 11/15/2021 1900 Gross per 24 hour  Intake 240 ml  Output --  Net 240 ml      Pressure Injury 11/03/21 Coccyx Medial Stage 2 -  Partial thickness loss of dermis presenting as a shallow open injury with a red, pink wound bed without slough. (Active)  11/03/21 1556  Location: Coccyx  Location Orientation: Medial  Staging: Stage 2 -  Partial thickness loss of dermis presenting as a shallow open injury with a red, pink wound bed without slough.  Wound Description (Comments):   Present on Admission:     Physical Exam: Vital Signs Blood pressure (!) 95/58, pulse 83,  temperature 97.8 F (36.6 C), resp. rate 18, SpO2 99 %.   General: No acute distress Mood and affect are appropriate Heart: Regular rate and rhythm no rubs murmurs or extra sounds Lungs: Clear to auscultation, breathing unlabored, no rales or wheezes Abdomen: Positive bowel sounds, soft nontender to palpation, mildly distended Extremities: No clubbing, cyanosis, or edema Skin: No evidence of breakdown, no evidence of rash Neurologic: Cranial nerves II through XII intact, motor strength is 4/5 in bilateral deltoid, bicep, tricep, grip,  trace hip flexor, knee extensors,0/5  ankle dorsiflexor and plantar flexor Sensory exam mildly reduced sensation to LT in feet, proprioception intact   Musculoskeletal: Full range of motion in UE , limited AROM in LE due to weakness  No joint swelling   Assessment/Plan: 1. Functional deficits which require 3+ hours per day of interdisciplinary therapy in a comprehensive inpatient rehab setting. Physiatrist is providing close team supervision and 24 hour management of active medical problems listed below. Physiatrist and rehab team continue to assess barriers to discharge/monitor patient progress toward functional and medical goals  Care Tool:  Bathing    Body parts bathed by patient: Face  Bathing assist Assist Level: Supervision/Verbal cueing     Upper Body Dressing/Undressing Upper body dressing   What is the patient wearing?: Pull over shirt    Upper body assist Assist Level: Minimal Assistance - Patient > 75%    Lower Body Dressing/Undressing Lower body dressing      What is the patient wearing?: Pants     Lower body assist Assist for lower body dressing: 2 Helpers     Toileting Toileting    Toileting assist Assist for toileting: Total Assistance - Patient < 25%     Transfers Chair/bed transfer  Transfers assist     Chair/bed transfer assist level: 2 Helpers     Locomotion Ambulation   Ambulation  assist   Ambulation activity did not occur: Safety/medical concerns          Walk 10 feet activity   Assist  Walk 10 feet activity did not occur: Safety/medical concerns        Walk 50 feet activity   Assist Walk 50 feet with 2 turns activity did not occur: Safety/medical concerns         Walk 150 feet activity   Assist Walk 150 feet activity did not occur: Safety/medical concerns         Walk 10 feet on uneven surface  activity   Assist Walk 10 feet on uneven surfaces activity did not occur: Safety/medical concerns         Wheelchair     Assist Is the patient using a wheelchair?: Yes (not tested during eval due to safety) Type of Wheelchair: Manual Wheelchair activity did not occur: Safety/medical concerns         Wheelchair 50 feet with 2 turns activity    Assist    Wheelchair 50 feet with 2 turns activity did not occur: Safety/medical concerns       Wheelchair 150 feet activity     Assist  Wheelchair 150 feet activity did not occur: Safety/medical concerns       Blood pressure (!) 95/58, pulse 83, temperature 97.8 F (36.6 C), resp. rate 18, SpO2 99 %.  Medical Problem List and Plan: 1. 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  Antithrombotics: -DVT/anticoagulation:  Pharmaceutical: Lovenox             -antiplatelet therapy: N/a 3. Pain:  Managed by Dr.Phillips. Continue Methadone and tramadol-->tid to qid.  --Oxycodone 5 mg TID with tylenol prn for pain. 4. Mood: Team to provide ego support/encouragement. LCSW to follow for evaluation and support.              -antipsychotic agents:  N/A 5. Neuropsych: This patient may be intermittently capable of making decisions on her own behalf. 6. Stage 2 decubitus ulcer: Routine pressure  relief measures.              --continue air mattress overlay for MASD/Stage 2 decub.  7. Fluids/Electrolytes/Nutrition: Strict I/O. Continue regular diet             --Ensure supplements TID for severe malnutrition.    Latest Ref Rng & Units 11/15/2021   10:45 AM 11/14/2021    6:06 AM 11/11/2021  3:44 AM  BMP  Glucose 70 - 99 mg/dL 110  94  97   BUN 8 - 23 mg/dL 10  9  8    Creatinine 0.44 - 1.00 mg/dL 0.32  0.31  0.32   Sodium 135 - 145 mmol/L 135  131  130   Potassium 3.5 - 5.1 mmol/L 3.9  3.4  3.6   Chloride 98 - 111 mmol/L 95  90  90   CO2 22 - 32 mmol/L 33  32  35   Calcium 8.9 - 10.3 mg/dL 8.2  8.3  8.2     8. COPD w/VDRF/Trach dependent: Continue ATC 5-8 liters --followed by Dr. Lamonte Sakai.  --Keep saturation 88-92% per pulmonary --encourage pulmonary hygiene  --Continue Singulair, Yuperi, Brovana and Pulmicort nebs.  9. Leucocytosis: Continue to trend and monitor for signs of infection             --has been treated for Kleb PNA/Kleb UTI             --recheck CBC in am. WBC trending down from 25.5-->15.4 on 06/07 10. Panic attacks/Anxiety d/o: Klonopin weaned down 05/30 to  0.5 mg TID.             --Seroquel 75 mg BID. Continue ativan 1mg  tid prn (po/IM route), has not taken supplemental Ativan  11. Cardiomyopathy: Continue Metoprolol 12.5 mg bid and Crestor.              --aldactone added 06/07-->monitor for orthostatic symptoms.  --continues to have intermittent tachycardia. Monitor for symptoms with increased activity.  12. Ileus/intermittent issues w/N/V: Has been refusing miralax and senna intermittently. Will decrease to Miralax to daily w/Senna S daily             --Keep Mg>2.0 and K>4.0 for adequate supplementation/recurrence.               --K trending down and subtherapeutic today-->will add Kdur 20 TID and check lytes X 2 days             --add Mg for supplement.  K+ in range  3 BMs in last 24h - KUB still FOS, abd soft non tender only mildly distended no N/V- start  with clear liquid diet this am advance as tolerated to full liquid  then regular  KUB looking worse than clinical picture  13. Pulmonary nodules: PCCM recommends repeat CT 6-8 weeks from 10/28/21 to monitor for resolution.  14. Interstitial cystitis s/p cystectomy and fistula repair: Followed by Dr.Evans.     LOS: 2 days A FACE TO FACE EVALUATION WAS PERFORMED  Charlett Blake 11/16/2021, 7:25 AM

## 2021-11-16 NOTE — Progress Notes (Signed)
Occupational Therapy Session Note  Patient Details  Name: [name] MRN: [id number] Date of Birth: 02/09/1956  Today's Date: 11/16/2021 OT Individual Time: 0820-0934 OT Individual Time Calculation (min): 74 min + 43 min    Short Term Goals: Week 1:  OT Short Term Goal 1 (Week 1): Pt will complete toilet transfer with max A and LRAD. OT Short Term Goal 2 (Week 1): Pt will complete sit to stand with min A of 2 and LRAD in prep for standing ADL. OT Short Term Goal 3 (Week 1): Pt will don pants with max A of 1.  Skilled Therapeutic Interventions/Progress Updates:    Session 1 (0820-0934) : Pt received semi-reclined in bed, denies pain and, agreeable to therapy. Session focus on self-care retraining, activity tolerance, transfer retraining in prep for improved ADL/IADL/func mobility performance + decreased caregiver burden.  Came to sitting EOB with max A to progress BLE and to lift trunk. Close S for sitting balance. Squat-pivot x2 throughout session with max A of 2 to lift/pivot hips . Pt becomes very fatigued post transfer.   Completed oral care, UBB and dressing at sink with close S, assist to manage trach collar/lines throughout. Stood at sink x2 with max A of 2 to achieve upright position as therapist completed LBD and pericare. Able to maintain standinf for ~10 seconds prior to having to sit down due to fatigue. Pt able to thread BLE into brief with assist to achieve figure four position.   L IV removed from hand during squat-pivot transfer, bandage applied and RN updated.  Pt satO2 at 92-98% on 6L via trach collar throughout session. HR at 91 to 119 bpm.  Set-up A for clear liquid breakfast tray. Pt left seated in w/c, on wall O2 with safety belt alarm engaged, call bell in reach, and all immediate needs met.    Session 2 (1020-1103): Pt received seated in w/c, no c/o pain but endorses fatigue, agreeable to therapy. Session focus on activity tolerance, BLE strengthening in prep  for improved ADL/IADL/func mobility performance + decreased caregiver burden.  SatO2 at 96% on 6L via trach collar, fiO2 at 28%, HR at 91 bpm.  Total A w/c transport to and from gym. Completed 5x10 repos on kinetron, 80 cm/sec with extended rest break in between. Requires total A to push down B pedals. Self rates modified RPE at 9/10. HR increases up to 124 bpm with activity.  Stedy transfer with max A of 2 to power up and extend trunk/hips > bed, returned to supine with min A to adjust BLE in bed.   Pt left with HOB at 30 degrees with call bell in reach, and all immediate needs met.    Therapy Documentation Precautions:  Precautions Precautions: Fall Precaution Comments: trach, B foot drop Restrictions Weight Bearing Restrictions: No  Pain:  No c/o throughout, endorses significant fatigue ADL: See Care Tool for more details. Therapy/Group: Individual Therapy   A  MS, OTR/L  11/16/2021, 6:53 AM 

## 2021-11-16 NOTE — Progress Notes (Signed)
Initial Nutrition Assessment  DOCUMENTATION CODES:   Severe malnutrition in context of chronic illness  INTERVENTION:   Discontinue Ensure Enlive Boost Breeze po TID, each supplement provides 250 kcal and 9 grams of protein Continue Multivitamin w/ minerals daily Recommend obtaining new weight.   NUTRITION DIAGNOSIS:   Severe Malnutrition related to chronic illness as evidenced by severe muscle depletion, severe fat depletion.  GOAL:   Patient will meet greater than or equal to 90% of their needs  MONITOR:   PO intake, Supplement acceptance, Labs, Weight trends, Skin  REASON FOR ASSESSMENT:   Other (Comment)    ASSESSMENT:   66 y.o. female admitted to CIR after a long hospital admission due to acute respiratory failure and hypercapnia, requiring intubation and a trach. During admission, pt developed a SBO vs ileus, treated with liquids diet. PMH includes GERD, COPD, and severe anxiety.   6/7 - admitted to CIR; Regular diet 6/9 - NPO; advanced to CLD  Pt downgraded to NPO this morning due to concern for SBO vs ileus in KUB in overnight x-ray. Noted that pt refused NGT to suction; reglan was added.  Pt resting in bed at time of RD visit.  Pt reports that she was just advanced to clears and she has tolerated jello thus far. Reports that her symptoms yesterday were caused by gas. Pt told RD that if she could just be on a regular diet for a few days she would eat much better.  Per EMR, pt PO intake includes 30% dinner on 6/7 and 20% of breakfast and 25% of lunch on 6/8.   Discussed ONS with pt, pt agreeable.  Pt with no new weight this admission, recommend obtaining one. RN notified.   Medications reviewed and include: Diflucan, Magnesium Oxide, Reglan, MVI, Protonix, Potassium Chloride, Spironolactone, Senokot Labs reviewed: Sodium 134   NUTRITION - FOCUSED PHYSICAL EXAM:  Flowsheet Row Most Recent Value  Orbital Region Severe depletion  Upper Arm Region Moderate  depletion  Thoracic and Lumbar Region Moderate depletion  Buccal Region Severe depletion  Temple Region Severe depletion  Clavicle Bone Region Severe depletion  Clavicle and Acromion Bone Region Severe depletion  Scapular Bone Region Severe depletion  Dorsal Hand Moderate depletion  Patellar Region Moderate depletion  Anterior Thigh Region Moderate depletion  Posterior Calf Region Severe depletion  Edema (RD Assessment) Mild  Hair Reviewed  Eyes Reviewed  Mouth Reviewed  Skin Reviewed  Nails Reviewed   Diet Order:   Diet Order             Diet clear liquid Room service appropriate? Yes; Fluid consistency: Thin  Diet effective now                   EDUCATION NEEDS:   No education needs have been identified at this time  Skin:  Skin Integrity Issues:: Stage II, Other (Comment) Stage II: Coccyx Other: MASD - Bilateral Buttocks  Last BM:  6/9  Height:   Ht Readings from Last 1 Encounters:  10/27/21 5\' 1"  (1.549 m)    Weight:   Wt Readings from Last 1 Encounters:  11/08/21 51.4 kg    Ideal Body Weight:  47.7 kg  BMI:  There is no height or weight on file to calculate BMI.  Estimated Nutritional Needs:   Kcal:  1700-1900  Protein:  90-110 grams  Fluid:  >/= 1.7 L    Hermina Barters RD, LDN Clinical Dietitian See [facility] for contact information.

## 2021-11-16 NOTE — Progress Notes (Signed)
RN Josh to ask for Reglan PO vs IV. Will place new consult if needed.

## 2021-11-16 NOTE — IPOC Note (Signed)
Overall Plan of Care [facility]) Patient Details Name: [name] MRN: [id number] DOB: Aug 19, 1955  Admitting Diagnosis: Critical illness neuropathy [facility])  Hospital Problems: Principal Problem:   Critical illness neuropathy ([place]) Active Problems:   Debility     Functional Problem List: Nursing Bladder, Bowel, Medication Management, Nutrition, Pain, Safety, Skin Integrity  PT Balance, Endurance, Safety  OT Balance, Cognition, Endurance, Motor  SLP Cognition  TR         Basic ADL's: OT Eating, Grooming, Bathing, Dressing, Toileting     Advanced  ADL's: OT       Transfers: PT Bed Mobility, Bed to Chair, Car, Furniture, Floor  OT Toilet, Metallurgist: PT Ambulation, Emergency planning/management officer, Stairs     Additional Impairments: OT Fuctional Use of Upper Extremity  SLP Social Cognition   Problem Solving, Memory  TR      Anticipated Outcomes Item Anticipated Outcome  Self Feeding mod I  Swallowing      Basic self-care  mod I to min A  Toileting  min A   Bathroom Transfers CGA  Bowel/Bladder  continent of bowel and bladder with min assist  Transfers  min A  Locomotion  min A with LRAD  Communication     Cognition  Mod I  Pain  pain less than or equal to 4/10  Safety/Judgment  free from falls/injury and displaying sound safety awareness   Therapy Plan: PT Intensity: Minimum of 1-2 x/day ,45 to 90 minutes PT Frequency: 5 out of 7 days PT Duration Estimated Length of Stay: 25-28 days OT Intensity: Minimum of 1-2 x/day, 45 to 90 minutes OT Frequency: 5 out of 7 days OT Duration/Estimated Length of Stay: 3.5-4 weeks SLP Intensity: Minumum of 1-2 x/day, 30 to 90 minutes SLP Frequency: 3 to 5 out of 7 days SLP Duration/Estimated Length of Stay: 3.5-4 weeks but may be shorter for SLP   Team Interventions: Nursing Interventions Patient/Family Education, Bladder Management, Bowel Management, Pain Management, Medication Management, Skin Care/Wound  Management, Discharge Planning  PT interventions Ambulation/gait training, Balance/vestibular training, Cognitive remediation/compensation, Community reintegration, Discharge planning, Disease management/prevention, DME/adaptive equipment instruction, Functional electrical stimulation, Functional mobility training, Neuromuscular re-education, Pain management, Patient/family education, Psychosocial support, Skin care/wound management, Splinting/orthotics, Stair training, Therapeutic Activities, Therapeutic Exercise, UE/LE Strength taining/ROM, UE/LE Coordination activities, Wheelchair propulsion/positioning  OT Interventions Balance/vestibular training, Neuromuscular re-education, Cognitive remediation/compensation, DME/adaptive equipment instruction, UE/LE Strength taining/ROM, Therapeutic Exercise, Self Care/advanced ADL retraining, Wheelchair propulsion/positioning, UE/LE Coordination activities, Community reintegration, Barrister's clerk education, Discharge planning, Functional mobility training, Psychosocial support, Therapeutic Activities  SLP Interventions Cognitive remediation/compensation, Internal/external aids, English as a second language teacher, Environmental controls, Therapeutic Activities, Functional tasks, Patient/family education  TR Interventions    SW/CM Interventions Discharge Planning, Psychosocial Support, Patient/Family Education, Disease Management/Prevention   Barriers to Discharge MD  Medical stability, Trach, and poor nutritional status  Nursing Decreased caregiver support, Home environment access/layout, New oxygen, Trach 1 level 3/1 ste no rails w spouse; sister to assist  PT Decreased caregiver support, Home environment access/layout, Trach, Incontinence, Lack of/limited family support husband to start chemo soon, unsure of other support upon d/c  OT Decreased caregiver support, Therapist, nutritional    SLP      SW Lack of/limited family support, Inaccessible home environment, Decreased caregiver support      Team Discharge Planning: Destination: PT-Home ,OT- Home , SLP-Home Projected Follow-up: PT-Home health PT, 24 hour supervision/assistance, OT-  Home health OT, Outpatient OT, SLP- (TBD) Projected Equipment Needs: PT-Wheelchair (measurements), Wheelchair cushion (measurements), Rolling walker with  5" wheels, OT- To be determined, SLP-To be determined Equipment Details: PT-TBD pending progress, OT-  Patient/family involved in discharge planning: PT- Patient,  OT-Patient, SLP-Patient  MD ELOS: 20-22d  Medical Rehab Prognosis:  Good Assessment: The patient has been admitted for CIR therapies with the diagnosis of critical illness polyneuropathy . The team will be addressing functional mobility, strength, stamina, balance, safety, adaptive techniques and equipment, self-care, bowel and bladder mgt, patient and caregiver education, trach care. Goals have been set at [facility]. Anticipated discharge destination is home.        See Team Conference Notes for weekly updates to the plan of care

## 2021-11-16 NOTE — Progress Notes (Signed)
KUB showed SBO but patient has been passing gas with liquid stools --likely ileus. She refused suggestion of NGT this evening when offered as an option to help with decompression of stomach/to help with all the discomfort. Will make her NPO with repeat KUB in am and reglan added.

## 2021-11-17 DIAGNOSIS — Z930 Tracheostomy status: Secondary | ICD-10-CM

## 2021-11-17 DIAGNOSIS — K560 Paralytic ileus: Secondary | ICD-10-CM

## 2021-11-17 DIAGNOSIS — F411 Generalized anxiety disorder: Secondary | ICD-10-CM

## 2021-11-17 MED ORDER — METOPROLOL TARTRATE 12.5 MG HALF TABLET: 12.5000 mg | ORAL_TABLET | Freq: Once | ORAL | Status: DC | PRN

## 2021-11-17 MED ORDER — CHLORHEXIDINE GLUCONATE 0.12 % MT SOLN
15.0000 mL | Freq: Two times a day (BID) | OROMUCOSAL | Status: DC
  Administered 2021-11-17 – 2021-12-11 (×42): 15 mL via OROMUCOSAL
  Filled 2021-11-17 (×47): qty 15

## 2021-11-17 MED ORDER — ORAL CARE MOUTH RINSE
15.0000 mL | Freq: Two times a day (BID) | OROMUCOSAL | Status: DC
  Administered 2021-11-17 – 2021-12-10 (×30): 15 mL via OROMUCOSAL

## 2021-11-17 MED ORDER — METOCLOPRAMIDE HCL 5 MG PO TABS
10.0000 mg | ORAL_TABLET | Freq: Three times a day (TID) | ORAL | Status: DC
  Administered 2021-11-17 – 2021-11-28 (×44): 10 mg via ORAL
  Filled 2021-11-17 (×44): qty 2

## 2021-11-17 NOTE — Progress Notes (Signed)
PROGRESS NOTE   Subjective/Complaints:  Pt feels weak since she hasn't gotten any solid food. Has been passing gas. Had two small/medium sized bm's yesterday  ROS: Patient denies fever, rash, sore throat, blurred vision, dizziness, nausea, vomiting, diarrhea, cough, shortness of breath or chest pain, joint or back/neck pain, headache, or mood change.   Objective:   DG Abd 1 View  Result Date: 11/16/2021 CLINICAL DATA:  Abdominal distention EXAM: ABDOMEN - 1 VIEW COMPARISON:  11/15/2021 abdominal radiograph FINDINGS: Retained oral contrast throughout the transverse and left colon, similar. Several mildly to moderately dilated small bowel loops throughout the abdomen, similar. No evidence of pneumatosis or pneumoperitoneum. Clear lung bases. IMPRESSION: Persistent mildly to moderately dilated small bowel loops throughout the abdomen. Retained oral contrast throughout the transverse and left colon, similar. Findings may indicate generalized adynamic ileus, with partial distal small bowel obstruction not excluded. Electronically Signed   By: Ilona Sorrel M.D.   On: 11/16/2021 08:26   DG Abd 1 View  Result Date: 11/15/2021 CLINICAL DATA:  Abdominal distension EXAM: ABDOMEN - 1 VIEW COMPARISON:  11/07/2021 FINDINGS: Considerable contrast material is noted throughout the colon slightly improved when compare with the prior exam. This suggest extremely delayed transit time and findings of constipation. Multiple dilated loops of small bowel are seen. Degenerative changes of lumbar spine are noted. IMPRESSION: Findings consistent with colonic constipation with significant persistent contrast within the colon from several days ago. Increasing small bowel dilatation consistent with worsening small bowel obstruction. No free air is noted. Electronically Signed   By: Inez Catalina M.D.   On: 11/15/2021 19:40   Recent Labs    11/15/21 1045  WBC 14.9*  HGB  10.0*  HCT 32.0*  PLT 426*   Recent Labs    11/15/21 1045 11/16/21 0600  NA 135 134*  K 3.9 4.2  CL 95* 97*  CO2 33* 29  GLUCOSE 110* 91  BUN 10 8  CREATININE 0.32* <0.30*  CALCIUM 8.2* 8.5*    Intake/Output Summary (Last 24 hours) at 11/17/2021 0816 Last data filed at 11/17/2021 0610 Gross per 24 hour  Intake 799 ml  Output 300 ml  Net 499 ml     Pressure Injury 11/03/21 Coccyx Medial Stage 2 -  Partial thickness loss of dermis presenting as a shallow open injury with a red, pink wound bed without slough. (Active)  11/03/21 1556  Location: Coccyx  Location Orientation: Medial  Staging: Stage 2 -  Partial thickness loss of dermis presenting as a shallow open injury with a red, pink wound bed without slough.  Wound Description (Comments):   Present on Admission:     Physical Exam: Vital Signs Blood pressure 103/68, pulse 86, temperature 98.1 F (36.7 C), resp. rate 14, height 5\' 1"  (1.549 m), weight 60.8 kg, SpO2 98 %.   Constitutional: No distress . Vital signs reviewed. HEENT: NCAT, EOMI, oral membranes moist Neck: supple Cardiovascular: RRR without murmur. No JVD    Respiratory/Chest: CTA Bilaterally without wheezes or rales. Normal effort    GI/Abdomen: distended, scarce bowels sounds, NT Ext: no clubbing, cyanosis, or edema Psych: pleasant and cooperative  Skin: No evidence of breakdown, no evidence  of rash Neurologic: Cranial nerves II through XII intact, motor strength is 4/5 in bilateral deltoid, bicep, tricep, grip,  trace hip flexor, knee extensors,0/5  ankle dorsiflexor and plantar flexor Sensory exam mildly reduced sensation to LT in feet, proprioception intact--no change  Musculoskeletal: Full range of motion in UE , limited AROM in LE due to weakness  No joint swelling   Assessment/Plan: 1. Functional deficits which require 3+ hours per day of interdisciplinary therapy in a comprehensive inpatient rehab setting. Physiatrist is providing close team  supervision and 24 hour management of active medical problems listed below. Physiatrist and rehab team continue to assess barriers to discharge/monitor patient progress toward functional and medical goals  Care Tool:  Bathing    Body parts bathed by patient: Face         Bathing assist Assist Level: Supervision/Verbal cueing     Upper Body Dressing/Undressing Upper body dressing   What is the patient wearing?: Pull over shirt    Upper body assist Assist Level: Minimal Assistance - Patient > 75%    Lower Body Dressing/Undressing Lower body dressing      What is the patient wearing?: Pants     Lower body assist Assist for lower body dressing: 2 Helpers     Toileting Toileting    Toileting assist Assist for toileting: Total Assistance - Patient < 25%     Transfers Chair/bed transfer  Transfers assist     Chair/bed transfer assist level: 2 Helpers     Locomotion Ambulation   Ambulation assist   Ambulation activity did not occur: Safety/medical concerns          Walk 10 feet activity   Assist  Walk 10 feet activity did not occur: Safety/medical concerns        Walk 50 feet activity   Assist Walk 50 feet with 2 turns activity did not occur: Safety/medical concerns         Walk 150 feet activity   Assist Walk 150 feet activity did not occur: Safety/medical concerns         Walk 10 feet on uneven surface  activity   Assist Walk 10 feet on uneven surfaces activity did not occur: Safety/medical concerns         Wheelchair     Assist Is the patient using a wheelchair?: Yes (not tested during eval due to safety) Type of Wheelchair: Manual Wheelchair activity did not occur: Safety/medical concerns         Wheelchair 50 feet with 2 turns activity    Assist    Wheelchair 50 feet with 2 turns activity did not occur: Safety/medical concerns       Wheelchair 150 feet activity     Assist  Wheelchair 150 feet  activity did not occur: Safety/medical concerns       Blood pressure 103/68, pulse 86, temperature 98.1 F (36.7 C), resp. rate 14, height 5\' 1"  (1.549 m), weight 60.8 kg, SpO2 98 %.  Medical Problem List and Plan: 1. 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  Antithrombotics: -DVT/anticoagulation:  Pharmaceutical: Lovenox             -  antiplatelet therapy: N/a 3. Pain:  Managed by Dr.Phillips. Continue Methadone and tramadol-->tid to qid.  --Oxycodone 5 mg TID with tylenol prn for pain. 4. Mood: Team to provide ego support/encouragement. LCSW to follow for evaluation and support.              -antipsychotic agents:  N/A 5. Neuropsych: This patient may be intermittently capable of making decisions on her own behalf. 6. Stage 2 decubitus ulcer: Routine pressure relief measures.              --continue air mattress overlay for MASD/Stage 2 decub.  7. Fluids/Electrolytes/Nutrition: Strict I/O. Continue regular diet             --Ensure supplements TID for severe malnutrition.    Latest Ref Rng & Units 11/16/2021    6:00 AM 11/15/2021   10:45 AM 11/14/2021    6:06 AM  BMP  Glucose 70 - 99 mg/dL 91  110  94   BUN 8 - 23 mg/dL 8  10  9    Creatinine 0.44 - 1.00 mg/dL <0.30  0.32  0.31   Sodium 135 - 145 mmol/L 134  135  131   Potassium 3.5 - 5.1 mmol/L 4.2  3.9  3.4   Chloride 98 - 111 mmol/L 97  95  90   CO2 22 - 32 mmol/L 29  33  32   Calcium 8.9 - 10.3 mg/dL 8.5  8.2  8.3    -encourage PO  8. COPD w/VDRF/Trach dependent: Continue ATC 5-8 liters --followed by Dr. Lamonte Sakai.  --Keep saturation 88-92% per pulmonary --encourage pulmonary hygiene  --Continue Singulair, Yuperi, Brovana and Pulmicort nebs.  9. Leucocytosis: Continue to trend and monitor for signs of  infection             --has been treated for Kleb PNA/Kleb UTI             --WBC trending down from 25.5-->14.9 6/8 10. Panic attacks/Anxiety d/o: Klonopin weaned down 05/30 to  0.5 mg TID.             --Seroquel 75 mg BID. Continue ativan 1mg  tid prn (po/IM route), has not taken supplemental Ativan  11. Cardiomyopathy: Continue Metoprolol 12.5 mg bid and Crestor.              --aldactone added 06/07-->monitor for orthostatic symptoms.  --continues to have intermittent tachycardia. Monitor for symptoms with increased activity.  12. Ileus/intermittent issues w/N/V: Has been refusing miralax and senna intermittently. Will decrease to Miralax to daily w/Senna S daily             --Keep Mg>2.0 and K>4.0 for adequate supplementation/recurrence.               --hypokalemia--kdur 20 TID and check lytes Monday             --add Mg for supplement.    -6/10--add reglan 10mg  tid-ac -advance to full liquid diet -observe today  13. Pulmonary nodules: PCCM recommends repeat CT 6-8 weeks from 10/28/21 to monitor for resolution.  14. Interstitial cystitis s/p cystectomy and fistula repair: Followed by Dr.Evans.     LOS: 3 days A FACE TO FACE EVALUATION WAS PERFORMED  Meredith Staggers 11/17/2021, 8:16 AM

## 2021-11-17 NOTE — Progress Notes (Signed)
Speech Language Pathology Daily Session Note  Patient Details  Name: [name] MRN: [id number] Date of Birth: December 02, 1955  Today's Date: 11/17/2021 SLP Individual Time: 1016-1100 SLP Individual Time Calculation (min): 44 min  Short Term Goals: Week 1: SLP Short Term Goal 1 (Week 1): Patient will demonstrate complex problem solving for functional tasks with supervision level verbal cues. SLP Short Term Goal 2 (Week 1): Patient will recal new, daily information with supervision level verbal cues for use of compensatory strategies SLP Short Term Goal 3 (Week 1): Patient will donn/doff PMSV with supervision level verbal and visual cues.  Skilled Therapeutic Interventions: Pt seen for skilled ST with focus on cognitive goals, pt sleeping upon arrival but rouses to voice and agreeable to participate in therapeutic tasks. SLP encouraging pt to donn PMSV however pt requesting for SLP to do it at this time. Educated pt on goal to donn/doff herself, agreeable to try in later sessions. Pt had laptop and cell phone present and able to utilize variety of programs functionally and timely (finding her Youtube channel, pictures, etc). Pt demonstrating adequate recall of new information (I.e. originally made NPO d/t possible SBO then requesting liquid diet, bowel management, other medical events and changes) with Supervision A cues. Pt states only change in cognition she notices is in memory, discussed compensatory memory strategies including writing information down and repetition of important information. Pt left in bed with PMSV on awaiting next therapy session. Cont ST POC.  Pain Pain Assessment Pain Scale: 0-10 Pain Score: 0-No pain  Therapy/Group: Individual Therapy  Tacey Ruiz 11/17/2021, 11:26 AM

## 2021-11-17 NOTE — Progress Notes (Signed)
Physical Therapy Session Note  Patient Details  Name: [name] MRN: [id number] Date of Birth: [birth year]  Today's Date: 11/17/2021 PT Individual Time: 0810-0905 PT Individual Time Calculation (min): 55 min   Short Term Goals: Week 1:  PT Short Term Goal 1 (Week 1): Pt will complete bed mobility with mod A x 1 PT Short Term Goal 2 (Week 1): Pt will complete least restrictive transfer with assist x 1 PT Short Term Goal 3 (Week 1): Pt will initiate gait training as safe and able PT Short Term Goal 4 (Week 1): Pt will initiate w/c mobility as safe and able   Skilled Therapeutic Interventions/Progress Updates:   Pt received supine in bed and agreeable to PT. Supine>sit transfer with min assist at trunk and cues for positioning at EOB to perform scooting to EOB  Squat pivot Transfers to and from [facility] with mod assist overall with 1 LE blocked and cues for BUE positioning. .   Sit<>stand in parallel bars with min assist fading to max assist on 3rd attempt due to fatigue and B:E weakenss. Standing tolerance 10, 15 and 20 sec with heavy Support from BUE. Mild orthostatic s/s on 3rd attempt to stand in parallel bars    PT Weighed pt in [facility] for RN.   Pt returned to room and performed squat pivot transfer to bed with min-mod A*. Sit>supine completed with min assist at BLE, and left supine in bed with call bell in reach and all needs met.       Therapy Documentation Precautions:  Precautions Precautions: Fall Precaution Comments: trach, B foot drop Restrictions Weight Bearing Restrictions: No    Pain: denies    Therapy/Group: Individual Therapy  Lorie Phenix 11/17/2021, 9:05 AM

## 2021-11-17 NOTE — Progress Notes (Signed)
Physical Therapy Session Note  Patient Details  Name: [name] MRN: [id number] Date of Birth: Aug 30, 1955  Today's Date: 11/17/2021 PT Individual Time: 1135-1203 PT Individual Time Calculation (min): 28 min   Short Term Goals: Week 1:  PT Short Term Goal 1 (Week 1): Pt will complete bed mobility with mod A x 1 PT Short Term Goal 2 (Week 1): Pt will complete least restrictive transfer with assist x 1 PT Short Term Goal 3 (Week 1): Pt will initiate gait training as safe and able PT Short Term Goal 4 (Week 1): Pt will initiate w/c mobility as safe and able  Skilled Therapeutic Interventions/Progress Updates: Pt presented in bed agreeable to therapy. Pt denies pain at rest but verbalizes increased fatigue as pt states has been on clear liquid diet and is hungry. Pt agreeable to supine therex and stretching at this time. PTA performed heel cord stretch and hamstring stretch 30 sec x 3 bilaterally, pt was also instructed in using gait belt to perform self stretch. Pt then participated in QS with 3 sec hold AA heel slides, AA hip abd/add, SAQ, and pillow squeeze with 3 sec hold, and hip ER "fall outs" all performed x 10 bilaterally. Pt did require some increased time between bouts due to fatigue but was able to successfully completed. HOB was lowered and pt was able to assist PTA in boosting self to [place].  Pt left in bed at end of session with call bell within reach and needs met.      Therapy Documentation Precautions:  Precautions Precautions: Fall Precaution Comments: trach, B foot drop Restrictions Weight Bearing Restrictions: No General:   Vital Signs: Therapy Vitals Pulse Rate: 87 Resp: 16 Patient Position (if appropriate): Lying Oxygen Therapy SpO2: 97 % O2 Device: Room Air Pain: Pain Assessment Pain Scale: 0-10 Pain Score: 0-No pain Mobility:   Locomotion :    Trunk/Postural Assessment :    Balance:   Exercises:   Other Treatments:       Therapy/Group: Individual Therapy  Trypp Heckmann 11/17/2021, 12:45 PM

## 2021-11-17 NOTE — Progress Notes (Signed)
Occupational Therapy Session Note  Patient Details  Name: [name] MRN: [id number] Date of Birth: September 30, 1955  Today's Date: 11/17/2021 OT Individual Time: 0722-5750 OT Individual Time Calculation (min): 60 min    Short Term Goals: Week 1:  OT Short Term Goal 1 (Week 1): Pt will complete toilet transfer with max A and LRAD. OT Short Term Goal 2 (Week 1): Pt will complete sit to stand with min A of 2 and LRAD in prep for standing ADL. OT Short Term Goal 3 (Week 1): Pt will don pants with max A of 1.  Skilled Therapeutic Interventions/Progress Updates:    Pt received semi-reclined in bed, endorses fatigue but, agreeable to therapy with encouragement. Session focus on transfer retraining, activity tolerance, BUE strengthening in prep for improved ADL/IADL/func mobility performance + decreased caregiver burden. Came to sitting EOB with max A to progress BLE off bed and to lift trunk. SB transfer > w/c with max A of 1 to boost and pivot hips. Pt reports preferring squat-pivot transfer. Later completed squat pivot transfer with max A of 1 to sufficiently pivot hips, required 2 scoots and assist to fully bring hips back on bed. Cues for head/hips relationship and technique throughout.  SatO2 at 77-100% on 4L via trach collar throughout session, read at 77% momentarily, but recovers quickly to mid 90s when O2 tube readjusted over neck plate.   Dep w/c transport to and from gym. Pt completed 2x10 of the following therex to promoted generalized activity tolerance and BUE strengthening with 1 lb dowel rod:  -forward/backward circles, B shoulder flexion, volley ball toss, chest press  Pt left with HOB at 30 degrees with call bell in reach, and all immediate needs met.  SatO2 at 96% via 5L, fi02 at 28%, HR at 104 bpm.   Therapy Documentation Precautions:  Precautions Precautions: Fall Precaution Comments: trach, B foot drop Restrictions Weight Bearing Restrictions: No   Pain:  No c/o but  endorses significant fatigue ADL: See Care Tool for more details.   Therapy/Group: Individual Therapy  Volanda Napoleon MS, OTR/L  11/17/2021, 6:55 AM

## 2021-11-17 NOTE — Progress Notes (Signed)
Called unit to check on patients for the night, spoke to bedside nurse Morrie Sheldon, RN.  Was notified the HR reached 120 prior to giving scheduled metoprolol dose of 12.5 mg this evening.  After medication administration, HR went down to 105 and  has sustained around 105. Bedside nurse to continue to monitor HR during the night.

## 2021-11-18 MED ORDER — POLYETHYLENE GLYCOL 3350 17 G PO PACK
17.0000 g | PACK | Freq: Every day | ORAL | Status: DC
  Administered 2021-11-20 – 2021-11-21 (×2): 17 g via ORAL
  Filled 2021-11-18 (×4): qty 1

## 2021-11-18 MED ORDER — SORBITOL 70 % SOLN
60.0000 mL | Status: AC
  Administered 2021-11-18: 60 mL via ORAL
  Filled 2021-11-18: qty 60

## 2021-11-18 MED ORDER — MEDIHONEY WOUND/BURN DRESSING EX PSTE
1.0000 "application " | PASTE | Freq: Every day | CUTANEOUS | Status: DC
  Administered 2021-11-18 – 2021-12-12 (×25): 1 via TOPICAL
  Filled 2021-11-18 (×2): qty 44

## 2021-11-18 NOTE — Progress Notes (Signed)
PROGRESS NOTE   Subjective/Complaints: Pt is anxious to have regular food. Ate well yesterday. Had one hard bm. +flatus. Feels "weak" because she's not getting enough "real" food.   ROS: Patient denies fever, rash, sore throat, blurred vision, dizziness, nausea, vomiting, diarrhea, cough, shortness of breath or chest pain, joint or back/neck pain, headache, or mood change.   Objective:   No results found. Recent Labs    11/15/21 1045  WBC 14.9*  HGB 10.0*  HCT 32.0*  PLT 426*   Recent Labs    11/15/21 1045 11/16/21 0600  NA 135 134*  K 3.9 4.2  CL 95* 97*  CO2 33* 29  GLUCOSE 110* 91  BUN 10 8  CREATININE 0.32* <0.30*  CALCIUM 8.2* 8.5*    Intake/Output Summary (Last 24 hours) at 11/18/2021 0804 Last data filed at 11/18/2021 0744 Gross per 24 hour  Intake 1680 ml  Output 200 ml  Net 1480 ml     Pressure Injury 11/03/21 Coccyx Medial Stage 2 -  Partial thickness loss of dermis presenting as a shallow open injury with a red, pink wound bed without slough. (Active)  11/03/21 1556  Location: Coccyx  Location Orientation: Medial  Staging: Stage 2 -  Partial thickness loss of dermis presenting as a shallow open injury with a red, pink wound bed without slough.  Wound Description (Comments):   Present on Admission:     Physical Exam: Vital Signs Blood pressure 104/66, pulse (!) 111, temperature 98 F (36.7 C), temperature source Oral, resp. rate 18, height 5\' 1"  (1.549 m), weight 48.5 kg, SpO2 94 %.   Constitutional: No distress . Vital signs reviewed. HEENT: NCAT, EOMI, oral membranes moist Neck: supple Cardiovascular: RRR without murmur. No JVD    Respiratory/Chest: CTA Bilaterally without wheezes or rales. Normal effort    GI/Abdomen: BS +, non-tender, non-distended Ext: no clubbing, cyanosis, or edema Psych: pleasant and cooperative   Skin: stage II coccyx 1cm with fibronecrotic debris in  center Neurologic: Cranial nerves II through XII intact, motor strength is 4/5 in bilateral deltoid, bicep, tricep, grip,  trace hip flexor, knee extensors,0/5  ankle dorsiflexor and plantar flexor Sensory exam mildly reduced sensation to LT in feet, proprioception intact--no change  Musculoskeletal: Full range of motion in UE , limited AROM in LE due to weakness  No joint swelling   Assessment/Plan: 1. Functional deficits which require 3+ hours per day of interdisciplinary therapy in a comprehensive inpatient rehab setting. Physiatrist is providing close team supervision and 24 hour management of active medical problems listed below. Physiatrist and rehab team continue to assess barriers to discharge/monitor patient progress toward functional and medical goals  Care Tool:  Bathing    Body parts bathed by patient: Face         Bathing assist Assist Level: Supervision/Verbal cueing     Upper Body Dressing/Undressing Upper body dressing   What is the patient wearing?: Pull over shirt    Upper body assist Assist Level: Minimal Assistance - Patient > 75%    Lower Body Dressing/Undressing Lower body dressing      What is the patient wearing?: Pants     Lower body assist  Assist for lower body dressing: 2 Helpers     Toileting Toileting    Toileting assist Assist for toileting: Dependent - Patient 0%     Transfers Chair/bed transfer  Transfers assist  Chair/bed transfer activity did not occur: N/A  Chair/bed transfer assist level: Dependent - Patient 0%     Locomotion Ambulation   Ambulation assist   Ambulation activity did not occur: Safety/medical concerns          Walk 10 feet activity   Assist  Walk 10 feet activity did not occur: Safety/medical concerns        Walk 50 feet activity   Assist Walk 50 feet with 2 turns activity did not occur: Safety/medical concerns         Walk 150 feet activity   Assist Walk 150 feet activity did  not occur: Safety/medical concerns         Walk 10 feet on uneven surface  activity   Assist Walk 10 feet on uneven surfaces activity did not occur: Safety/medical concerns         Wheelchair     Assist Is the patient using a wheelchair?: Yes (not tested during eval due to safety) Type of Wheelchair: Manual Wheelchair activity did not occur: Safety/medical concerns         Wheelchair 50 feet with 2 turns activity    Assist    Wheelchair 50 feet with 2 turns activity did not occur: Safety/medical concerns       Wheelchair 150 feet activity     Assist  Wheelchair 150 feet activity did not occur: Safety/medical concerns       Blood pressure 104/66, pulse (!) 111, temperature 98 F (36.7 C), temperature source Oral, resp. rate 18, height 5\' 1"  (1.549 m), weight 48.5 kg, SpO2 94 %.  Medical Problem List and Plan: 1. 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  Antithrombotics: -DVT/anticoagulation:  Pharmaceutical: Lovenox             -antiplatelet therapy: N/a 3. Pain:  Managed by Dr.Phillips. Continue Methadone and tramadol-->tid to qid.  --Oxycodone 5 mg TID with tylenol prn for pain. 4. Mood: Team to provide ego support/encouragement. LCSW to follow for evaluation and support.              -antipsychotic agents:  N/A 5. Neuropsych: This patient may be intermittently capable of making decisions on her own behalf. 6. Stage 2 decubitus ulcer: Routine pressure relief measures.              --continue air mattress overlay for MASD/Stage 2 decub.   6/11 begin medihoney to wound daily 7. Fluids/Electrolytes/Nutrition: Strict I/O. Continue regular diet             --Ensure supplements TID for severe malnutrition.    Latest Ref Rng & Units  11/16/2021    6:00 AM 11/15/2021   10:45 AM 11/14/2021    6:06 AM  BMP  Glucose 70 - 99 mg/dL 91  110  94   BUN 8 - 23 mg/dL 8  10  9    Creatinine 0.44 - 1.00 mg/dL <0.30  0.32  0.31   Sodium 135 - 145 mmol/L 134  135  131   Potassium 3.5 - 5.1 mmol/L 4.2  3.9  3.4   Chloride 98 - 111 mmol/L 97  95  90   CO2 22 - 32 mmol/L 29  33  32   Calcium 8.9 - 10.3 mg/dL 8.5  8.2  8.3    -encourage PO  8. COPD w/VDRF/Trach dependent: Continue ATC 5-8 liters --followed by Dr. Lamonte Sakai.  --Keep saturation 88-92% per pulmonary --encourage pulmonary hygiene  --Continue Singulair, Yuperi, Brovana and Pulmicort nebs.  9. Leucocytosis: Continue to trend and monitor for signs of infection             --has been treated for Kleb PNA/Kleb UTI             --WBC trending down from 25.5-->14.9 6/8 10. Panic attacks/Anxiety d/o: Klonopin weaned down 05/30 to  0.5 mg TID.             --Seroquel 75 mg BID. Continue ativan 1mg  tid prn (po/IM route), has not taken supplemental Ativan  11. Cardiomyopathy: Continue Metoprolol 12.5 mg bid and Crestor.              --aldactone added 06/07-->monitor for orthostatic symptoms.  --still with intermittent tachy 12. Ileus/intermittent issues w/N/V: Has been refusing miralax and senna intermittently. Will decrease to Miralax to daily w/Senna S daily             --Keep Mg>2.0 and K>4.0 for adequate supplementation/recurrence.               --hypokalemia--kdur 20 TID and check lytes Monday             --add Mg for supplement.    -6/11-feeling better, +hard bm yesterday -continue reglan 10mg  tid-ac -sorbitol 60cc today -sse if no bm this morning.  -advance to reg diet, low residue -observe today  13. Pulmonary nodules: PCCM recommends repeat CT 6-8 weeks from 10/28/21 to monitor for resolution.  14. Interstitial cystitis s/p cystectomy and fistula repair: Followed by Dr.Evans.     LOS: 4 days A FACE TO FACE EVALUATION WAS PERFORMED  Meredith Staggers 11/18/2021, 8:04 AM

## 2021-11-19 LAB — BASIC METABOLIC PANEL
Anion gap: 9 (ref 5–15)
BUN: 7 mg/dL — ABNORMAL LOW (ref 8–23)
CO2: 27 mmol/L (ref 22–32)
Calcium: 8.6 mg/dL — ABNORMAL LOW (ref 8.9–10.3)
Chloride: 93 mmol/L — ABNORMAL LOW (ref 98–111)
Creatinine, Ser: 0.38 mg/dL — ABNORMAL LOW (ref 0.44–1.00)
GFR, Estimated: >60 mL/min (ref >60)
Glucose, Bld: 100 mg/dL — ABNORMAL HIGH (ref 70–99)
Potassium: 4.2 mmol/L (ref 3.5–5.1)
Sodium: 129 mmol/L — ABNORMAL LOW (ref 135–145)

## 2021-11-19 LAB — CBC
HCT: 31.6 % — ABNORMAL LOW (ref 36.0–46.0)
Hemoglobin: 9.9 g/dL — ABNORMAL LOW (ref 12.0–15.0)
MCH: 29.7 pg (ref 26.0–34.0)
MCHC: 31.3 g/dL (ref 30.0–36.0)
MCV: 94.9 fL (ref 80.0–100.0)
Platelets: 426 10*3/uL — ABNORMAL HIGH (ref 150–400)
RBC: 3.33 MIL/uL — ABNORMAL LOW (ref 3.87–5.11)
RDW: 15.4 % (ref 11.5–15.5)
WBC: 16.5 10*3/uL — ABNORMAL HIGH (ref 4.0–10.5)
nRBC: 0 % (ref 0.0–0.2)

## 2021-11-19 MED ORDER — SIMETHICONE 80 MG PO CHEW: 80.0000 mg | CHEWABLE_TABLET | Freq: Four times a day (QID) | ORAL | 0 refills | Status: DC | PRN

## 2021-11-19 MED ORDER — ADULT MULTIVITAMIN W/MINERALS CH: 1.0000 | ORAL_TABLET | Freq: Every day | ORAL | Status: AC

## 2021-11-19 MED ORDER — WHITE PETROLATUM EX OINT: 1.0000 "application " | TOPICAL_OINTMENT | CUTANEOUS | 0 refills | Status: DC | PRN

## 2021-11-19 NOTE — Progress Notes (Signed)
Occupational Therapy Session Note  Patient Details  Name: [name] MRN: [id number] Date of Birth: [birth year]  Today's Date: 11/19/2021 OT Individual Time: 1030-1100 OT Individual Time Calculation (min): 30 min    Short Term Goals: Week 1:  OT Short Term Goal 1 (Week 1): Pt will complete toilet transfer with max A and LRAD. OT Short Term Goal 2 (Week 1): Pt will complete sit to stand with min A of 2 and LRAD in prep for standing ADL. OT Short Term Goal 3 (Week 1): Pt will don pants with max A of 1.  Therapy Documentation Precautions:  Precautions Precautions: Fall Precaution Comments: trach, B foot drop Restrictions Weight Bearing Restrictions: No General:  Upon OT arrival, pt seated in w/c and states "I'm anxious. My heart rate seems like it's going fast". Pt's HR in 120s at rest and up to 130s with activity. LPN present to provide pt with anxiety medication. Treatment session focused on functional transfers and self care tasks. Pt also requires rest breaks to calm self when feeling more anxious. Pt completes functional transfer from w/c to [facility] and back to w/c with Max A. Verbal cues required for body positioning and hand placement. Increased time required. Pt completes stand pivot transfer to bed with Max A and completes sit to supine transfer with Mod A for LE's. Pt completes toileting and LB dressing at the levels listed below. Pt completes part of toileting on BSC and part of toileting bed level. Pt performs rolling in bed for brief management with Supervision. Pt limited by anxiety and decreased activity tolerance. Pt continues to benefit from OT services to maximize independence and safety during self care. Pt left in bed with HOB elevated and all needs met.   ADL: Lower Body Dressing: Dependent Where Assessed-Lower Body Dressing: Wheelchair Toileting: Dependent Where Assessed-Toileting: Bedside Commode, Bed level Toilet Transfer: Maximal assistance Toilet Transfer Method:  Stand pivot Toilet Transfer Equipment: Bedside commode  Therapy/Group: Individual Therapy  Marvetta Gibbons 11/19/2021, 11:10 AM

## 2021-11-19 NOTE — Progress Notes (Signed)
PROGRESS NOTE   Subjective/Complaints:   Discussed potential etiologies and prognosis for nerve injury in LEs ROS: Patient denies CP, SOB, N/V/D  Objective:   No results found. Recent Labs    11/19/21 0641  WBC 16.5*  HGB 9.9*  HCT 31.6*  PLT 426*    Recent Labs    11/19/21 0641  NA 129*  K 4.2  CL 93*  CO2 27  GLUCOSE 100*  BUN 7*  CREATININE 0.38*  CALCIUM 8.6*     Intake/Output Summary (Last 24 hours) at 11/19/2021 [id number] Last data filed at 11/19/2021 [id number] Gross per 24 hour  Intake 960 ml  Output 600 ml  Net 360 ml      Pressure Injury 11/03/21 Coccyx Medial Stage 2 -  Partial thickness loss of dermis presenting as a shallow open injury with a red, pink wound bed without slough. (Active)  11/03/21 1556  Location: Coccyx  Location Orientation: Medial  Staging: Stage 2 -  Partial thickness loss of dermis presenting as a shallow open injury with a red, pink wound bed without slough.  Wound Description (Comments):   Present on Admission:     Physical Exam: Vital Signs Blood pressure 110/71, pulse (!) 108, temperature 98.3 F (36.8 C), temperature source Oral, resp. rate 20, height 5\' 1"  (1.549 m), weight 48.5 kg, SpO2 100 %.    Ext: no clubbing, cyanosis, or edema Psych: pleasant and cooperative   Skin: stage II coccyx 1cm with fibronecrotic debris in center Neurologic: Cranial nerves II through XII intact, motor strength is 4/5 in bilateral deltoid, bicep, tricep, grip,  trace hip flexor, knee extensors,0/5  ankle dorsiflexor and plantar flexor Sensory exam mildly reduced sensation to LT in feet, proprioception intact--no change  Musculoskeletal: Full range of motion in UE , limited AROM in LE due to weakness  No joint swelling   Assessment/Plan: 1. Functional deficits which require 3+ hours per day of interdisciplinary therapy in a comprehensive inpatient rehab setting. Physiatrist is  providing close team supervision and 24 hour management of active medical problems listed below. Physiatrist and rehab team continue to assess barriers to discharge/monitor patient progress toward functional and medical goals  Care Tool:  Bathing    Body parts bathed by patient: Face         Bathing assist Assist Level: Supervision/Verbal cueing     Upper Body Dressing/Undressing Upper body dressing   What is the patient wearing?: Pull over shirt    Upper body assist Assist Level: Minimal Assistance - Patient > 75%    Lower Body Dressing/Undressing Lower body dressing      What is the patient wearing?: Pants     Lower body assist Assist for lower body dressing: 2 Helpers     Toileting Toileting    Toileting assist Assist for toileting: Dependent - Patient 0%     Transfers Chair/bed transfer  Transfers assist  Chair/bed transfer activity did not occur: N/A  Chair/bed transfer assist level: Dependent - Patient 0%     Locomotion Ambulation   Ambulation assist   Ambulation activity did not occur: Safety/medical concerns          Walk 10 feet activity  Assist  Walk 10 feet activity did not occur: Safety/medical concerns        Walk 50 feet activity   Assist Walk 50 feet with 2 turns activity did not occur: Safety/medical concerns         Walk 150 feet activity   Assist Walk 150 feet activity did not occur: Safety/medical concerns         Walk 10 feet on uneven surface  activity   Assist Walk 10 feet on uneven surfaces activity did not occur: Safety/medical concerns         Wheelchair     Assist Is the patient using a wheelchair?: Yes (not tested during eval due to safety) Type of Wheelchair: Manual Wheelchair activity did not occur: Safety/medical concerns         Wheelchair 50 feet with 2 turns activity    Assist    Wheelchair 50 feet with 2 turns activity did not occur: Safety/medical concerns        Wheelchair 150 feet activity     Assist  Wheelchair 150 feet activity did not occur: Safety/medical concerns       Blood pressure 110/71, pulse (!) 108, temperature 98.3 F (36.8 C), temperature source Oral, resp. rate 20, height 5\' 1"  (1.549 m), weight 48.5 kg, SpO2 100 %.  Medical Problem List and Plan: 1. 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  Antithrombotics: -DVT/anticoagulation:  Pharmaceutical: Lovenox             -antiplatelet therapy: N/a 3. Pain:  Managed by Dr.Phillips. Continue Methadone and tramadol-->tid to qid.  --Oxycodone 5 mg TID with tylenol prn for pain. 4. Mood: Team to provide ego support/encouragement. LCSW to follow for evaluation and support.              -antipsychotic agents:  N/A 5. Neuropsych: This patient is capable of making decisions on her own behalf. 6. Stage 2 decubitus ulcer: Routine pressure relief measures.              --continue air mattress overlay for MASD/Stage 2 decub.   6/11 begin medihoney to wound daily 7. Fluids/Electrolytes/Nutrition: Strict I/O. Continue regular diet             --Ensure supplements TID for severe malnutrition.    Latest Ref Rng & Units 11/19/2021    6:41 AM 11/16/2021    6:00 AM 11/15/2021   10:45 AM  BMP  Glucose 70 - 99 mg/dL 100  91  110   BUN 8 - 23 mg/dL 7  8  10    Creatinine 0.44 - 1.00 mg/dL 0.38  <0.30  0.32   Sodium 135 - 145 mmol/L 129  134  135   Potassium 3.5 - 5.1 mmol/L 4.2  4.2  3.9   Chloride 98 - 111 mmol/L 93  97  95   CO2 22 - 32 mmol/L 27  29  33   Calcium 8.9 - 10.3 mg/dL 8.6  8.5  8.2   Well hydrated  8. COPD w/VDRF/Trach dependent: Continue ATC 5-8 liters --followed by Dr. Lamonte Sakai.  --Keep saturation 88-92% per pulmonary --encourage pulmonary hygiene   --  Continue Singulair, Yuperi, Brovana and Pulmicort nebs.  9. Leucocytosis: Down vs peak but still mildly elevated, afebrile ,cont to monitor  10. Panic attacks/Anxiety d/o: Klonopin weaned down 05/30 to  0.5 mg TID.             --Seroquel 75 mg BID. Continue ativan 1mg  tid prn (po/IM route), has not taken supplemental Ativan  11. Cardiomyopathy: Continue Metoprolol 12.5 mg bid and Crestor.              --aldactone added 06/07-->monitor for orthostatic symptoms.  --still with intermittent tachy 12. Ileus/intermittent issues w/N/V: Has been refusing miralax and senna intermittently. Will decrease to Miralax to daily w/Senna S daily             --Keep Mg>2.0 and K>4.0 for adequate supplementation/recurrence.               --hypokalemia--kdur 20 TID and check lytes Monday             --add Mg for supplement.    -6/11-feeling better, +hard bm yesterday -continue reglan 10mg  tid-ac -sorbitol 60cc today -sse if no bm this morning.  -advance to reg diet, low residue -multiple BMs yesterday  13. Pulmonary nodules: PCCM recommends repeat CT 6-8 weeks from 10/28/21 to monitor for resolution.  14. Interstitial cystitis s/p cystectomy and fistula repair: Followed by Dr.Evans.   61.  Dispo discussed need for assistance post d/c , husband reportedly undergoing chemotherapy treatment, discuss in conf Wed , pt is anxious about care needs   LOS: 5 days A FACE TO FACE EVALUATION WAS PERFORMED  Charlett Blake 11/19/2021, 8:21 AM

## 2021-11-19 NOTE — Discharge Instructions (Addendum)
Inpatient Rehab Discharge Instructions  [name] Discharge date and time:  12/07/2021  Activities/Precautions/ Functional Status: Activity: no lifting, driving, or strenuous exercise for till cleared by MD Diet: Regular Wound Care: keep wound clean and dry   Functional status:  ___ No restrictions     ___ Walk up steps independently ___ 24/7 supervision/assistance   ___ Walk up steps with assistance ___ Intermittent supervision/assistance  ___ Bathe/dress independently ___ Walk with walker     ___ Bathe/dress with assistance ___ Walk Independently    ___ Shower independently ___ Walk with assistance    ___ Shower with assistance _X__ No alcohol     ___ Return to work/school ________  COMMUNITY REFERRALS UPON DISCHARGE:    Home Health:   PT     OT     RN                   Agency: [facility] Phone: [phone]   Medical Equipment/Items Ordered: Hospital Bed, Wheelchair, Rolling Walker, Drop Arm Commode, Producer, television/film/video                                                 Agency/Supplier: [id number] [phone]   Special Instructions: No driving smoking or alcohol   My questions have been answered and I understand these instructions. I will adhere to these goals and the provided educational materials after my discharge from the hospital.  Patient/Caregiver Signature _______________________________ Date __________  Clinician Signature _______________________________________ Date __________  Please bring this form and your medication list with you to all your follow-up doctor's appointments.

## 2021-11-19 NOTE — Progress Notes (Signed)
Physical Therapy Session Note  Patient Details  Name: [name] MRN: [id number] Date of Birth: July 10, 1955  Today's Date: 11/19/2021 PT Individual Time: [id number] and [id number] PT Individual Time Calculation (min): 26 min and 55 min  Short Term Goals: Week 1:  PT Short Term Goal 1 (Week 1): Pt will complete bed mobility with mod A x 1 PT Short Term Goal 2 (Week 1): Pt will complete least restrictive transfer with assist x 1 PT Short Term Goal 3 (Week 1): Pt will initiate gait training as safe and able PT Short Term Goal 4 (Week 1): Pt will initiate w/c mobility as safe and able  Skilled Therapeutic Interventions/Progress Updates:     1st Session: Pt received supine in bed and agrees to therapy. No complaint of pain. Supine to sit with modA and cues for sequencing. Pt takes several minutes at EOB due to feeling "a little light headed". PT assists with donning ted hose and socks prior to additional mobility. Stand pivot transfer to [facility] with modA and cues for initiation, sequencing, and positioning. WC transport to gym for time management and energy conservation. Pt stands x2 reps in parallel bars, requiring modA overall for power up from [facility]. Pt unable to achieve full hip extension on initial rep but on subsequent rep with tactile facilitation of glute activation, pt achieves more upright, neutral posture. Extended seated rest break between bouts. WC transport back to room. Pt left seated with alarm intact and all needs within reach.   2nd Session: pt received supine in bed and agrees to therapy, though reports that she is not feeling good because she recently found out that her husband has Stage IV cancer. Pt does not complain of pain. Pt performs supine to sit with verbal cues for logrolling and sequencing, and no physical assistance required. Stand pivot transfer to [facility] with modA +2 and cues for body mechanics, initiation, and sequencing. WC transport to gym for time management. Pt completes  standing activity in standing frame to work on strength, stretching of lower leg muscles, balance and activity tolerance. Pt initially stands with full hip support from sling, with bilateral upper extremity support. Pt progresses to resting arms on pillow without gripping table. PT cues for downward rotation and depression of scapulae to optimize posture. Pt vitals monitored and HR up to 128 with O2 in high 90s on 6L O2 via trach. Pt maintains standing ~5 minutes prior to requiring rest breaks. Pt completes x3 more stands with extended seated rest breaks in between. On subsequent bouts, PT loosens harness and cues pt to engage hip extensor and pt is able to maintain hip extension for ~15-30 seconds at a time piror to requiring rest.   WC transport back to room. Stand pivot back to bed with modA and cues for hand placement and sequencing. Sit to supine with modA management of bilateral lower extremities. Left supine with alarm intact and all needs within reach.  Therapy Documentation Precautions:  Precautions Precautions: Fall Precaution Comments: trach, B foot drop Restrictions Weight Bearing Restrictions: No    Therapy/Group: Individual Therapy  Breck Coons, PT, DPT 11/19/2021, 5:51 PM

## 2021-11-19 NOTE — Progress Notes (Signed)
Occupational Therapy Session Note  Patient Details  Name: [name] MRN: [id number] Date of Birth: 1955-11-21  Today's Date: 11/19/2021 OT Individual Time: 1300-1415 OT Individual Time Calculation (min): 75 min    Short Term Goals: Week 1:  OT Short Term Goal 1 (Week 1): Pt will complete toilet transfer with max A and LRAD. OT Short Term Goal 2 (Week 1): Pt will complete sit to stand with min A of 2 and LRAD in prep for standing ADL. OT Short Term Goal 3 (Week 1): Pt will don pants with max A of 1.  Skilled Therapeutic Interventions/Progress Updates:    S: Pt reports that she received the results of her Husband tests and he does have cancer. She is not feel well to get out of bed but is interested in participating in bedside/bed level activities. Requests to work on her lower extremities to help with standing better.    O: - Passive and dynamic stretches: bilateral hamstrings, hip flexors, external rotators, P/ROM, 10X each - bed mobility completed: supine to sit: HOB elevated, mod assist (assist with legs and UB) - Manual therapy: muscle energy technique used with bilateral hamstrings to relax tone and muscle spasm and improve range of motion.  - isometric strengthening: seated, BLE, plantar flexion, 3x10" (4 inch step used while seated on EOB for improved balance, posture, and joint alignment). -Strengthening: seated bilateral hip abductors, adduction, hold 2", 20X each. Towel use for adductors. Yellow band used for abductors. - Bed mobility: Rolling left and right completed with Min assist using bed railing while soiled brief was changed.  - Side lying clam shells performed with bilateral lower extremities with therapist providing min support for proper form and technique. 10X each   A: Skilled OT session completed in patient's room while focusing on lower extremity ROM, flexibility, and strength in order to carry over in lower body bathing/dressing, functional transfers, and  functional performance during static standing. Patient was able to demonstrate improved dorsi flexion in both ankles after muscle energy technique was performed while achieving less than 90 degree ankle flexion. Bilateral quad weakness demonstrated when patient was attempting to complete seated leg lifts against gravity (1/5 muscle grade). Right lower extremity presents overall weaker than the left with greater joint mobility limitations. Education provided throughout session with verbal instruction of ways to carry over stretches/exercises performed in session independently when in bed. Encouraged, patient to wear PRAFOS when in bed at all times to decrease muscle tone in her ankles and help improve her ability to stand and transfer during therapy sessions.    P: try standing frame with patient to provide needed support to lower body while increasing activity tolerance and standing ability.    Therapy Documentation Precautions:  Precautions Precautions: Fall Precaution Comments: trach, B foot drop Restrictions Weight Bearing Restrictions: No  Pain:   Therapy/Group: Individual Therapy  Ailene Ravel, OTR/L,CBIS  Supplemental OT - [facility]  11/19/2021, 12:56 PM

## 2021-11-20 ENCOUNTER — Encounter (HOSPITAL_COMMUNITY): Payer: Medicare Other

## 2021-11-20 MED ORDER — ENSURE ENLIVE PO LIQD
237.0000 mL | Freq: Three times a day (TID) | ORAL | Status: DC
  Administered 2021-11-21 – 2021-11-26 (×9): 237 mL via ORAL
  Filled 2021-11-20: qty 237

## 2021-11-20 NOTE — Progress Notes (Addendum)
Nutrition Follow-up  DOCUMENTATION CODES:   Severe malnutrition in context of chronic illness  INTERVENTION:   Ensure Enlive po TID, each supplement provides 350 kcal and 20 grams of protein. Discontinue Boost Breeze Continue Multivitamin w/ minerals daily Encourage good PO intake  NUTRITION DIAGNOSIS:   Severe Malnutrition related to chronic illness as evidenced by severe muscle depletion, severe fat depletion. - Ongoing  GOAL:   Patient will meet greater than or equal to 90% of their needs - Progressing  MONITOR:   PO intake, Supplement acceptance, Labs, Weight trends, Skin  REASON FOR ASSESSMENT:   Consult Assessment of nutrition requirement/status  ASSESSMENT:   66 y.o. female admitted to CIR after a long hospital admission due to acute respiratory failure and hypercapnia, requiring intubation and a trach. During admission, pt developed a SBO vs ileus, treated with liquids diet. PMH includes GERD, COPD, and severe anxiety.   6/07 - admitted to CIR; Regular diet 6/09 - NPO; advanced to CLD 6/10 - diet advanced to full liquids 6/11 - diet advanced to regular diet  RD received a consult, pt requesting Ensure and does not like Boost Breeze. Now that pt diet has been advanced can change order.  RD working remotely today, unable to reach pt by phone.   Per EMR, pt PO intake includes: 6/10: Lunch 50%, Dinner 100% 6/11: Breakfast 75%, Lunch 50%, Dinner 50% 6/12: Breakfast 75%, Lunch 50%, Dinner 25%  Pt PO intake appears to improved, continue to encourage pt to eat well.   Medications reviewed and include: Magnesium Oxide, Reglan, MVI, Protonix, Potassium Chloride, Spironolactone, Senokot Labs reviewed: Sodium 129, BUN 7, Creatinine 0.38  Diet Order:   Diet Order             Diet regular Room service appropriate? Yes; Fluid consistency: Thin  Diet effective now                   EDUCATION NEEDS:   No education needs have been identified at this  time  Skin:  Skin Integrity Issues:: Stage II, Other (Comment) Stage II: Coccyx Other: MASD - Bilateral Buttocks  Last BM:  6/11  Height:  Ht Readings from Last 1 Encounters:  11/16/21 5\' 1"  (1.549 m)   Weight:  Wt Readings from Last 1 Encounters:  11/17/21 48.5 kg   Ideal Body Weight:  47.7 kg  BMI:  Body mass index is 20.22 kg/m.  Estimated Nutritional Needs:  Kcal:  1700-1900 Protein:  90-110 grams Fluid:  >/= 1.7 L    Hermina Barters RD, LDN Clinical Dietitian See [facility] for contact information.

## 2021-11-20 NOTE — Progress Notes (Signed)
Occupational Therapy Session Note  Patient Details  Name: [name] MRN: [id number] Date of Birth: 02/14/1956  Today's Date: 11/20/2021 Session 1:  OT Individual Time: 2694-8546 OT Individual Time Calculation (min): 75 min   Session 2:  OT individual Time: 1415-1445 OT individual time  calculation (min): 30 min   Short Term Goals: Week 1:  OT Short Term Goal 1 (Week 1): Pt will complete toilet transfer with max A and LRAD. OT Short Term Goal 2 (Week 1): Pt will complete sit to stand with min A of 2 and LRAD in prep for standing ADL. OT Short Term Goal 3 (Week 1): Pt will don pants with max A of 1.  Skilled Therapeutic Interventions/Progress Updates:  Session 1:   S: Patient agreeable to washing up at sink level this AM.   O: - Grooming: oral care  and face wash completed while seated at sink in w/c. Therapist provided set-up of supplies in order to increase patient's independence with task completion. Encouraged patient to verbalize any additional supplies needed.  - Using sink tray, Therapist assist patient with hair washing while seated in w/c. Focused on neck strength while performing while maintaining  cervical spine in an isometric  position of slight neck extension - UB bathing: facilitated independence with UB bathing while seated at sink in w/c with VC for technique and energy conservation. Pt performed  task with Supervision. Hospital gown was replaced with clean gown.   A: Skilled OT session focused on increasing activity tolerance and endurance while completing Self care grooming task. VC were provided for form and technique. SPO2 was monitored during session with patient remaining in the 90's overall.     P: Continue to work on activity tolerance and participation with Self -care tasks. LB bathing and grooming for PM session.    Session 2: S: Pt in bed upon therapy arrival. Reports that it took a lot of effort to get her back into bed with Nursing.     O: Session focused on static sitting balance and activity tolerance while seated on EOB participating in grooming task of shaving her legs. Pt was able to transition from supine to seated with min assist while using bed railings. 4 inch step was used to provide bilateral lower extremity support and proper positioning of ankles in addition to facilitating passive hamstring stretch bilaterally. Therapist and patient assisted with applying shaving cream to lower legs. Therapist did complete the shaving aspect of task due to patient's fragile skin and hand weakness. Pt assisted with lotion application after shaving was completed. Once patient transitioned back to supine in bed with min assist provided for lower extremity management, bilateral PRAFO boots were applied. TED hose and socks kept off until Nursing applies new skin protectant barriers to bilateral ankles.    A: Pt demonstrated improved activity tolerance and slightly less increased tone in bilateral ankles. Continues to require education regarding benefit of wearing PRAFO boots as much as possible to help decrease amount of muscle tone experienced in bilateral ankles.     P: Try standing activity using standing frame to increase standing tolerance and overall strength and endurance.    Therapy Documentation Precautions:  Precautions Precautions: Fall Precaution Comments: trach, B foot drop Restrictions Weight Bearing Restrictions: No  Pain: no pain reported at both sessions.  Therapy/Group: Individual Therapy  [name], OTR/L,CBIS  Supplemental OT - [facility]  11/20/2021, 7:55 AM

## 2021-11-20 NOTE — Progress Notes (Signed)
PROGRESS NOTE   Subjective/Complaints:  No issues overnite, no abd pain , worried about husband who reportedly has stage 4 Ca   ROS: Patient denies CP, SOB, N/V/D  Objective:   No results found. Recent Labs    11/19/21 0641  WBC 16.5*  HGB 9.9*  HCT 31.6*  PLT 426*    Recent Labs    11/19/21 0641  NA 129*  K 4.2  CL 93*  CO2 27  GLUCOSE 100*  BUN 7*  CREATININE 0.38*  CALCIUM 8.6*     Intake/Output Summary (Last 24 hours) at 11/20/2021 0803 Last data filed at 11/19/2021 1812 Gross per 24 hour  Intake 720 ml  Output --  Net 720 ml      Pressure Injury 11/03/21 Coccyx Medial Stage 2 -  Partial thickness loss of dermis presenting as a shallow open injury with a red, pink wound bed without slough. (Active)  11/03/21 1556  Location: Coccyx  Location Orientation: Medial  Staging: Stage 2 -  Partial thickness loss of dermis presenting as a shallow open injury with a red, pink wound bed without slough.  Wound Description (Comments):   Present on Admission:     Physical Exam: Vital Signs Blood pressure 112/80, pulse 86, temperature 97.8 F (36.6 C), temperature source Oral, resp. rate 18, height 5\' 1"  (1.549 m), weight 48.5 kg, SpO2 98 %.    Ext: no clubbing, cyanosis, or edema Psych: pleasant and cooperative   Skin: stage II coccyx 1cm with fibronecrotic debris in center Neurologic: Cranial nerves II through XII intact, motor strength is 4/5 in bilateral deltoid, bicep, tricep, grip,  trace hip flexor, knee extensors,0/5  ankle dorsiflexor and plantar flexor Sensory exam mildly reduced sensation to LT in feet, proprioception intact--no change  Musculoskeletal: Full range of motion in UE , limited AROM in LE due to weakness  No joint swelling   Assessment/Plan: 1. Functional deficits which require 3+ hours per day of interdisciplinary therapy in a comprehensive inpatient rehab setting. Physiatrist is  providing close team supervision and 24 hour management of active medical problems listed below. Physiatrist and rehab team continue to assess barriers to discharge/monitor patient progress toward functional and medical goals  Care Tool:  Bathing    Body parts bathed by patient: Face         Bathing assist Assist Level: Supervision/Verbal cueing     Upper Body Dressing/Undressing Upper body dressing   What is the patient wearing?: Pull over shirt    Upper body assist Assist Level: Minimal Assistance - Patient > 75%    Lower Body Dressing/Undressing Lower body dressing      What is the patient wearing?: Incontinence brief     Lower body assist Assist for lower body dressing: Dependent - Patient 0%     Toileting Toileting    Toileting assist Assist for toileting: Dependent - Patient 0%     Transfers Chair/bed transfer  Transfers assist  Chair/bed transfer activity did not occur: N/A  Chair/bed transfer assist level: Dependent - Patient 0%     Locomotion Ambulation   Ambulation assist   Ambulation activity did not occur: Safety/medical concerns  Walk 10 feet activity   Assist  Walk 10 feet activity did not occur: Safety/medical concerns        Walk 50 feet activity   Assist Walk 50 feet with 2 turns activity did not occur: Safety/medical concerns         Walk 150 feet activity   Assist Walk 150 feet activity did not occur: Safety/medical concerns         Walk 10 feet on uneven surface  activity   Assist Walk 10 feet on uneven surfaces activity did not occur: Safety/medical concerns         Wheelchair     Assist Is the patient using a wheelchair?: Yes (not tested during eval due to safety) Type of Wheelchair: Manual Wheelchair activity did not occur: Safety/medical concerns         Wheelchair 50 feet with 2 turns activity    Assist    Wheelchair 50 feet with 2 turns activity did not occur:  Safety/medical concerns       Wheelchair 150 feet activity     Assist  Wheelchair 150 feet activity did not occur: Safety/medical concerns       Blood pressure 112/80, pulse 86, temperature 97.8 F (36.6 C), temperature source Oral, resp. rate 18, height 5\' 1"  (1.549 m), weight 48.5 kg, SpO2 98 %.  Medical Problem List and Plan: 1. 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  Antithrombotics: -DVT/anticoagulation:  Pharmaceutical: Lovenox             -antiplatelet therapy: N/a 3. Pain:  Managed by Dr.Phillips. Continue Methadone and tramadol-->tid to qid.  --Oxycodone 5 mg TID with tylenol prn for pain. 4. Mood: Team to provide ego support/encouragement. LCSW to follow for evaluation and support.              -antipsychotic agents:  N/A 5. Neuropsych: This patient is capable of making decisions on her own behalf. 6. Stage 2 decubitus ulcer: Routine pressure relief measures.              --continue air mattress overlay for MASD/Stage 2 decub.   6/11 begin medihoney to wound daily 7. Fluids/Electrolytes/Nutrition: Strict I/O. Continue regular diet             --Ensure supplements TID for severe malnutrition.    Latest Ref Rng & Units 11/19/2021    6:41 AM 11/16/2021    6:00 AM 11/15/2021   10:45 AM  BMP  Glucose 70 - 99 mg/dL 100  91  110   BUN 8 - 23 mg/dL 7  8  10    Creatinine 0.44 - 1.00 mg/dL 0.38  <0.30  0.32   Sodium 135 - 145 mmol/L 129  134  135   Potassium 3.5 - 5.1 mmol/L 4.2  4.2  3.9   Chloride 98 - 111 mmol/L 93  97  95   CO2 22 - 32 mmol/L 27  29  33   Calcium 8.9 - 10.3 mg/dL 8.6  8.5  8.2   Well hydrated  8. COPD w/VDRF/Trach dependent: Continue ATC 5-8 liters --followed by Dr. Lamonte Sakai.  --Keep saturation 88-92% per  pulmonary --encourage  pulmonary hygiene  --Continue Singulair, Yuperi, Brovana and Pulmicort nebs.  9. Leucocytosis: Down vs peak but still mildly elevated, afebrile ,cont to monitor  10. Panic attacks/Anxiety d/o: Klonopin weaned down 05/30 to  0.5 mg TID.             --Seroquel 75 mg BID. Continue ativan 1mg  tid prn (po/IM route), has not taken supplemental Ativan  11. Cardiomyopathy: Continue Metoprolol 12.5 mg bid and Crestor.              --aldactone added 06/07-->monitor for orthostatic symptoms.  --still with intermittent tachy 12. Ileus/intermittent issues w/N/V: Has been refusing miralax and senna intermittently. Will decrease to Miralax to daily w/Senna S daily             --Keep Mg>2.0 and K>4.0 for adequate supplementation/recurrence.               --hypokalemia--kdur 20 TID and check lytes Monday             --add Mg for supplement.    Multiple BMs after sorbitol 2 d ago but no BM yesterday , still on Reglan , no signs of ileus , may d/c 13. Pulmonary nodules: PCCM recommends repeat CT 6-8 weeks from 10/28/21 to monitor for resolution.  14. Interstitial cystitis s/p cystectomy and fistula repair: Followed by Dr.Evans.   13.  Dispo discussed need for assistance post d/c , husband reportedly undergoing chemotherapy treatment recently told he has Stage 4 , discuss in conf Wed , pt is anxious about care needs , has other family members who can assist   LOS: 6 days A FACE TO FACE EVALUATION WAS PERFORMED  Charlett Blake 11/20/2021, 8:03 AM

## 2021-11-20 NOTE — Progress Notes (Signed)
Speech Language Pathology Daily Session Note  Patient Details  Name: [name] MRN: [id number] Date of Birth: [birth year]  Today's Date: 11/20/2021 SLP Individual Time: 1105-1205 SLP Individual Time Calculation (min): 60 min  Short Term Goals: Week 1: SLP Short Term Goal 1 (Week 1): Patient will demonstrate complex problem solving for functional tasks with supervision level verbal cues. - Did not formally address this session.  SLP Short Term Goal 2 (Week 1): Patient will recal new, daily information with supervision level verbal cues for use of compensatory strategies. - Pt recalled sequence for donning and doffing with Min A, and wear guidelines with Min to Sup A.  SLP Short Term Goal 3 (Week 1): Patient will donn/doff PMSV with supervision level verbal and visual cues. - Pt successfully donned and doffed PMSV with Max, faded to Mod verbal and visual A with mass practice.  Skilled Therapeutic Interventions: Pt seen this date for skilled ST intervention targeting PMSV goals outlined above. Pt received awake/alert and OOB in w/c; speaking valve donned and VSS per telemetry. C/o sacral pain; LPN notified. Agreeable to ST intervention in hospital room.   Please see above for objective data re: pt's performance on targeted goals. Thorough education completed re: PMSV wear guidelines (doffing during sleep + SOB, donning for all PO intake and during waking hours as tolerated), donning and doffing PMSV, and infection control of PMSV; pt verbalized and demonstrated understanding of all education via teach back.   Given ongoing c/o pain in sacral area and pt request, pt transferred back to bed with Max A via Stedy + 2. Incontinent brief noted; NT notified. Pt left in bed with all safety measures activated. Call bell reviewed and within reach, and all immediate needs met. Continue per current ST POC.  Pain Pt reports 10/10 pain in sacral area; LPN notified and administered  medication.  Therapy/Group: Individual Therapy  Sheenah Dimitroff A Tharun Cappella 11/20/2021, 12:38 PM

## 2021-11-20 NOTE — Progress Notes (Signed)
Physical Therapy Session Note  Patient Details  Name: [name] MRN: [id number] Date of Birth: May 25, 1956  Today's Date: 11/20/2021 PT Individual Time: 0800-0825 PT Individual Time Calculation (min): 25 min   PT Missed Time: 20 Minutes Missed Time Reason: Other (Comment) (breakfast)  Short Term Goals: Week 1:  PT Short Term Goal 1 (Week 1): Pt will complete bed mobility with mod A x 1 PT Short Term Goal 2 (Week 1): Pt will complete least restrictive transfer with assist x 1 PT Short Term Goal 3 (Week 1): Pt will initiate gait training as safe and able PT Short Term Goal 4 (Week 1): Pt will initiate w/c mobility as safe and able  Skilled Therapeutic Interventions/Progress Updates:      Pt supine in bed to start. On 5L wall O2 via trach collar. Pt eating her breakfast and reports that she just got it, breakfast arriving late. She denies pain. She requested therapy to be blocked off from 0800-0900 due to "not being a morning person." Relayed to scheduler. Informed her of upcoming therapy schedule. MD arriving for morning rounding - pt informs MD that her husband has just found out he has Stage IV cancer. Emotional support provided from PT.   Donned TED's and hospital socks with totalA for time management. Pt requesting to eat her meal while it's warm. She's agreeable to mobilizing OOB to chair with encouragement to optimize positioning while eating (pt slumped in bed trying to eat/drink). Supine<>sitting EOB with minA with hospital bed features. Stand<>pivot transfer with modA from EOB to w/c. SetupA for her breakfast. Call bell in reach, all needs met.  She missed 20 minutes of therapy due to requesting to eat her breakfast.  Will attempt to make up time as schedule and pt availability permits.    Therapy Documentation Precautions:  Precautions Precautions: Fall Precaution Comments: trach, B foot drop Restrictions Weight Bearing Restrictions: No General:    Therapy/Group:  Individual Therapy  [name] 11/20/2021, 7:43 AM

## 2021-11-21 DIAGNOSIS — J9601 Acute respiratory failure with hypoxia: Secondary | ICD-10-CM

## 2021-11-21 NOTE — Progress Notes (Signed)
   NAME:  BONNEY MEINS, MRN:  [id number], DOB:  1955/08/13, LOS: 7 ADMISSION DATE:  11/14/2021, CONSULTATION DATE:  4/18 REFERRING MD:  Tat/ triad, CHIEF COMPLAINT:  resp distress    History of Present Illness:  66 y.o. female quit smoking 2021 with  GOLD 3 COPD MZ, uncontrolled anxiety. Was on 02 one year PTA but "took herself off" per friend at bedside and able to care for pets at home but mostly housebound sinc ethen  She was admitted 4/17 to [facility], ICU with acute hypoxic/hypercarbic respiratory failure, failed BiPAP and required mechanical ventilation, transferred to [facility] 4/21 due to new finding of LV dysfunction.  Significant Hospital Events: Including procedures, antibiotic start and stop dates in addition to other pertinent events   ET  4/18 c/b hypotension with severe air trapping  Echo 4/20 with EF 40% , akinesis of apex and septal wall 4/24 CTA head negative for any significant abnormality 4/25 ketamine added 4/28 increased work of breathing during the wean, severe auto PEEP 4/29 Febrile 101.2 5/1 perc trach placement 5/15 trach changed to Shiley #6 cuffless 5/16 antibiotics resumed for inreasing resp distress, leukocytosis, increased LA 5/30 ileus/SBO 6/14 reevaluated in CIR for possible trach capping and eventual decanualtion. No major issue trach issues over the last several days  Interim History / Subjective:  States she feels well and is ready for capping trial and hopeful decaulation soon  Objective   Blood pressure 106/68, pulse (!) 107, temperature 97.8 F (36.6 C), temperature source Oral, resp. rate 16, height 5\' 1"  (1.549 m), weight 48.5 kg, SpO2 97 %.    FiO2 (%):  [28 %] 28 %   Intake/Output Summary (Last 24 hours) at 11/21/2021 1355 Last data filed at 11/21/2021 0700 Gross per 24 hour  Intake 476 ml  Output --  Net 476 ml    Filed Weights   11/16/21 2134 11/17/21 1000  Weight: 60.8 kg 48.5 kg   Examination: General: Acute on chronic ill appearing  thin middle aged femal lying in bed, in NAD HEENT: 6 cuffed shiley midline with PMV in place , MM pink/moist, PERRL,  Neuro: Alert and oriented x3, non-focal  CV: s1s2 regular rate and rhythm, no murmur, rubs, or gallops,  PULM:  Clear to ascultation, no increased work of breathing, no added breath sounds GI: soft, bowel sounds active in all 4 quadrants, non-tender, non-distended, tolerating oral diet  Extremities: warm/dry, no edema  Skin: no rashes or lesions  Assessment & Plan:   Acute hypoxemic/hypercapnic respiratory failure Tracheostomy with vent dependent respiratory failure -5/1 perc trach placement -5/15 trach changed to Shiley #6 cuffless Treated for Klebsiella pneumonia Severe anxiety Severe protein calorie malnutrition Dysphagia Pulmonary nodules  P: Cap trach today with plans to decanulate over the next day or tow  Continue to encourage pulmonary hygiene Continue aggressive PT  Continue BDs Outpatient pulmonary follow up for pulmonary nodules   Aribelle Mccosh D. Kenton Kingfisher, NP-C [facility] Personal contact information can be found on Amion  11/21/2021, 2:07 PM

## 2021-11-21 NOTE — Progress Notes (Signed)
Physical Therapy Session Note  Patient Details  Name: SAMYAH BILBO MRN: [id number] Date of Birth: [birth year]  Today's Date: 11/21/2021   Short Term Goals: Week 1:  PT Short Term Goal 1 (Week 1): Pt will complete bed mobility with mod A x 1 PT Short Term Goal 2 (Week 1): Pt will complete least restrictive transfer with assist x 1 PT Short Term Goal 3 (Week 1): Pt will initiate gait training as safe and able PT Short Term Goal 4 (Week 1): Pt will initiate w/c mobility as safe and able Week 2:     Skilled Therapeutic Interventions/Progress Updates:   Pt received supine in bed, asleep. Pt unable to be aroused with significant stimuli. Left in bed with call bell in reach and all needs met.     Therapy Documentation Precautions:  Precautions Precautions: Fall Precaution Comments: trach, B foot drop Restrictions Weight Bearing Restrictions: No General: PT Amount of Missed Time (min): 45 Minutes PT Missed Treatment Reason: Patient fatigue  Pain: Pain Assessment Pain Scale: 0-10 Pain Score: 0-No pain     Therapy/Group: Individual Therapy  Lorie Phenix 11/21/2021, 6:13 PM

## 2021-11-21 NOTE — Progress Notes (Signed)
Physical Therapy Session Note  Patient Details  Name: [name] MRN: [id number] Date of Birth: [birth year]  Today's Date: 11/21/2021 PT Individual Time: 0905-1000 PT Individual Time Calculation (min): 55 min   Short Term Goals: Week 1:  PT Short Term Goal 1 (Week 1): Pt will complete bed mobility with mod A x 1 PT Short Term Goal 2 (Week 1): Pt will complete least restrictive transfer with assist x 1 PT Short Term Goal 3 (Week 1): Pt will initiate gait training as safe and able PT Short Term Goal 4 (Week 1): Pt will initiate w/c mobility as safe and able  Skilled Therapeutic Interventions/Progress Updates:   Pt received supine in bed and agreeable to PT. Rolling R and L with min assist Bil to doff/don brief following incontinent bladder movement. Pt reports letting NT know proir to therapy, but nothing don to address incontinence per pt reports since early AM.  Donning pants with rolling r and L with min assist as listed. Supine>sit transfer with mod assist through log roll and heavy use of bed rails. Slide board transfer to [facility] with mod assist and cues for improved head/hips relation ship and assist from PT to block BLE into proper  position to allow push through bil feet.   Pt transported to parallel bars in WC. Sit<>stand from [facility] with mod assist on first bout and max assist on second. Pt able to take 2 steps with BLE on each bout in standing with tactile cues for terminal knee extension in stance on BLE. +2 for WC follow in parallel bars.   Kinetron endurance training. 3 x 1 min with min-mod assist from PT for full ROM on BLE with cues for reciprocal movement pattern. Pt returned to room and performed slide board transfer to bed with mod assist. Sit>supine completed with min assist at BLE, and left supine in bed with call bell in reach and all needs met.        Therapy Documentation Precautions:  Precautions Precautions: Fall Precaution Comments: trach, B foot  drop Restrictions Weight Bearing Restrictions: No    Vital Signs: Therapy Vitals Pulse Rate: (!) 107 Resp: 16 Patient Position (if appropriate): Sitting Oxygen Therapy SpO2: 97 % O2 Device: Tracheostomy Collar O2 Flow Rate (L/min): 5 L/min FiO2 (%): 28 % Pain: Denies at rest, mild bowel discomfort. Does not rate.    Therapy/Group: Individual Therapy  Lorie Phenix 11/21/2021, 1:45 PM

## 2021-11-21 NOTE — Progress Notes (Signed)
PROGRESS NOTE   Subjective/Complaints:  No new issues , ongoing constipation , does not feel that miralax is helpful   ROS: Patient denies CP, SOB, N/V/D  Objective:   No results found. Recent Labs    11/19/21 0641  WBC 16.5*  HGB 9.9*  HCT 31.6*  PLT 426*    Recent Labs    11/19/21 0641  NA 129*  K 4.2  CL 93*  CO2 27  GLUCOSE 100*  BUN 7*  CREATININE 0.38*  CALCIUM 8.6*     Intake/Output Summary (Last 24 hours) at 11/21/2021 0844 Last data filed at 11/20/2021 1822 Gross per 24 hour  Intake 720 ml  Output --  Net 720 ml      Pressure Injury 11/03/21 Coccyx Medial Stage 2 -  Partial thickness loss of dermis presenting as a shallow open injury with a red, pink wound bed without slough. (Active)  11/03/21 1556  Location: Coccyx  Location Orientation: Medial  Staging: Stage 2 -  Partial thickness loss of dermis presenting as a shallow open injury with a red, pink wound bed without slough.  Wound Description (Comments):   Present on Admission:     Physical Exam: Vital Signs Blood pressure 106/68, pulse (!) 104, temperature 97.8 F (36.6 C), temperature source Oral, resp. rate 18, height 5\' 1"  (1.549 m), weight 48.5 kg, SpO2 98 %.    General: No acute distress Mood and affect are appropriate Heart: Regular rate and rhythm no rubs murmurs or extra sounds Lungs: Clear to auscultation, breathing unlabored, no rales or wheezes Abdomen: Positive bowel sounds, soft nontender to palpation, nondistended Extremities: No clubbing, cyanosis, or edema  Ext: no clubbing, cyanosis, or edema Psych: pleasant and cooperative   Skin: stage II coccyx 1cm with fibronecrotic debris in center Neurologic: Cranial nerves II through XII intact, motor strength is 4/5 in bilateral deltoid, bicep, tricep, grip,  trace hip flexor, knee extensors,0/5  ankle dorsiflexor and plantar flexor Sensory exam mildly reduced sensation  to LT in feet, proprioception intact--no change  Musculoskeletal: Full range of motion in UE , limited AROM in LE due to weakness  No joint swelling   Assessment/Plan: 1. Functional deficits which require 3+ hours per day of interdisciplinary therapy in a comprehensive inpatient rehab setting. Physiatrist is providing close team supervision and 24 hour management of active medical problems listed below. Physiatrist and rehab team continue to assess barriers to discharge/monitor patient progress toward functional and medical goals  Care Tool:  Bathing    Body parts bathed by patient: Face         Bathing assist Assist Level: Supervision/Verbal cueing     Upper Body Dressing/Undressing Upper body dressing   What is the patient wearing?: Pull over shirt    Upper body assist Assist Level: Minimal Assistance - Patient > 75%    Lower Body Dressing/Undressing Lower body dressing      What is the patient wearing?: Incontinence brief     Lower body assist Assist for lower body dressing: Dependent - Patient 0%     Toileting Toileting    Toileting assist Assist for toileting: Dependent - Patient 0%     Transfers Chair/bed  transfer  Transfers assist  Chair/bed transfer activity did not occur: N/A  Chair/bed transfer assist level: Dependent - Patient 0%     Locomotion Ambulation   Ambulation assist   Ambulation activity did not occur: Safety/medical concerns          Walk 10 feet activity   Assist  Walk 10 feet activity did not occur: Safety/medical concerns        Walk 50 feet activity   Assist Walk 50 feet with 2 turns activity did not occur: Safety/medical concerns         Walk 150 feet activity   Assist Walk 150 feet activity did not occur: Safety/medical concerns         Walk 10 feet on uneven surface  activity   Assist Walk 10 feet on uneven surfaces activity did not occur: Safety/medical concerns          Wheelchair     Assist Is the patient using a wheelchair?: Yes (not tested during eval due to safety) Type of Wheelchair: Manual Wheelchair activity did not occur: Safety/medical concerns         Wheelchair 50 feet with 2 turns activity    Assist    Wheelchair 50 feet with 2 turns activity did not occur: Safety/medical concerns       Wheelchair 150 feet activity     Assist  Wheelchair 150 feet activity did not occur: Safety/medical concerns       Blood pressure 106/68, pulse (!) 104, temperature 97.8 F (36.6 C), temperature source Oral, resp. rate 18, height 5\' 1"  (1.549 m), weight 48.5 kg, SpO2 98 %.  Medical Problem List and Plan: 1. 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  Antithrombotics: -DVT/anticoagulation:  Pharmaceutical: Lovenox             -antiplatelet therapy: N/a 3. Pain:  Managed by Dr.Phillips. Continue Methadone and tramadol-->tid to qid.  --Oxycodone 5 mg TID with tylenol prn for pain. 4. Mood: Team to provide ego support/encouragement. LCSW to follow for evaluation and support.              -antipsychotic agents:  N/A 5. Neuropsych: This patient is capable of making decisions on her own behalf. 6. Stage 2 decubitus ulcer: Routine pressure relief measures.              --continue air mattress overlay for MASD/Stage 2 decub.   6/11 begin medihoney to wound daily 7. Fluids/Electrolytes/Nutrition: Strict I/O. Continue regular diet             --Ensure supplements TID for severe malnutrition.    Latest Ref Rng & Units 11/19/2021    6:41 AM 11/16/2021    6:00 AM 11/15/2021   10:45 AM  BMP  Glucose 70 - 99 mg/dL 100  91  110   BUN 8 - 23 mg/dL 7  8  10    Creatinine 0.44 - 1.00 mg/dL 0.38  <0.30  0.32   Sodium 135 - 145  mmol/L 129  134  135   Potassium 3.5 - 5.1 mmol/L 4.2  4.2  3.9   Chloride 98 - 111 mmol/L  93  97  95   CO2 22 - 32 mmol/L 27  29  33   Calcium 8.9 - 10.3 mg/dL 8.6  8.5  8.2   Well hydrated  8. COPD w/VDRF/Trach dependent: Continue ATC 5-8 liters --followed by Dr. Lamonte Sakai.  --Keep saturation 88-92% per pulmonary --encourage pulmonary hygiene  --Continue Singulair, Yuperi, Brovana and Pulmicort nebs.  9. Leucocytosis: Down vs peak but still mildly elevated, afebrile ,cont to monitor  10. Panic attacks/Anxiety d/o: Klonopin weaned down 05/30 to  0.5 mg TID.             --Seroquel 75 mg BID. Continue ativan 1mg  tid prn (po/IM route), has not taken supplemental Ativan  11. Cardiomyopathy: Continue Metoprolol 12.5 mg bid and Crestor.              --aldactone added 06/07-->monitor for orthostatic symptoms.  No sign of failure 6/14 12. Ileus/intermittent issues w/N/V: Has been refusing miralax and senna intermittently. Will decrease to Miralax to daily w/Senna S daily             --Keep Mg>2.0 and K>4.0 for adequate supplementation/recurrence.               --hypokalemia--kdur 20 TID and check lytes Monday             --add Mg for supplement.    Multiple BMs after sorbitol 2 d ago but no BM yesterday , still on Reglan , no signs of ileus , may d/c 13. Pulmonary nodules: PCCM recommends repeat CT 6-8 weeks from 10/28/21 to monitor for resolution.  14. Interstitial cystitis s/p cystectomy and fistula repair: Followed by Dr.Evans.   11.  Dispo discussed need for assistance post d/c , husband reportedly undergoing chemotherapy treatment recently told he has Stage 4 , discuss in conf Wed , pt is anxious about care needs , has other family members who can assist   LOS: 7 days A FACE TO FACE EVALUATION WAS PERFORMED  [name] 11/21/2021, 8:44 AM

## 2021-11-21 NOTE — Progress Notes (Signed)
Occupational Therapy Session Note  Patient Details  Name: [name] MRN: [id number] Date of Birth: 04-21-1956  Today's Date: 11/21/2021 OT Individual Time: 1121-1203 OT Individual Time Calculation (min): 42 min    Short Term Goals: Week 1:  OT Short Term Goal 1 (Week 1): Pt will complete toilet transfer with max A and LRAD. OT Short Term Goal 2 (Week 1): Pt will complete sit to stand with min A of 2 and LRAD in prep for standing ADL. OT Short Term Goal 3 (Week 1): Pt will don pants with max A of 1.  Skilled Therapeutic Interventions/Progress Updates:    Pt received semi-reclined in bed, sister present, no c/o pain, agreeable to therapy. Session focus on transfer retraining, activity tolerance, BLE strengthening in prep for improved ADL/IADL/func mobility performance + decreased caregiver burden. Came to siting EOB with close S and increased time/use of bed features.   SB transfer > w/c > mat table with min A of 2 to sufficiently lift and pivot hips, assist to place and stabilize board.   Pt stood x3 with mod A of 2 and B HHA. Pt with difficulty extending trunk and fatigues quickly, requesting to defer further standing. Practiced kicking large ball with BLE to promote BLE strengthening.  SatO2 at 96-100% on 5L via trach collar throughout session. Hr at 106-122 bpm.  Pt left seated in w/c with sister present with call bell in reach, and all immediate needs met.    Therapy Documentation Precautions:  Precautions Precautions: Fall Precaution Comments: trach, B foot drop Restrictions Weight Bearing Restrictions: No  Pain: no c/o   ADL: See Care Tool for more details.   Therapy/Group: Individual Therapy  Volanda Napoleon MS, OTR/L  11/21/2021, 6:53 AM

## 2021-11-21 NOTE — Patient Care Conference (Signed)
Inpatient RehabilitationTeam Conference and Plan of Care Update Date: 11/21/2021   Time: 10:15 AM    Patient Name: [name]      Medical Record Number: [id number]  Date of Birth: 1955/08/29 Sex: Female         Room/Bed: 4W23C/4W23C-01 Payor Info: Payor: [employer] / Plan: [employer] MEDICARE / Product Type: *No Product type* /    Admit Date/Time:  11/14/2021  4:01 PM  Primary Diagnosis:  Critical illness neuropathy [facility])  Hospital Problems: Principal Problem:   Critical illness neuropathy ([place]) Active Problems:   Tracheostomy status [facility])   Debility    Expected Discharge Date: Expected Discharge Date: 12/07/21  Team Members Present: Physician leading conference: Dr. Alysia Penna Social Worker Present: Erlene Quan, BSW Nurse Present: Dorien Chihuahua, RN PT Present: Barrie Folk, PT OT Present: Providence Lanius, OT SLP Present: Sherren Kerns, SLP PPS Coordinator present : Gunnar Fusi, SLP     Current Status/Progress Goal Weekly Team Focus  Bowel/Bladder   Continent of Bowel and Bladder. LBM 11/20/21  Maintain continence  Assess with bahroom priveleges as requested.   Swallow/Nutrition/ Hydration   Regular diet with thin liquids - at baseline  N/A  N/A   ADL's   set-up for seated UB ADL at sink, max A stand-pivot to [facility], total A for toileting tasks, very limited by fatigue/anxiousness with mobility - may consider 15/7  currently min A to CGA  self-care/transfer/balance retraining, pt/family/AE/DME education, activity tolerance   Mobility   min-mod assist bed mobility. mod-max assist transfers. sit<>stand and gain in parallel bars with max A+2  min assist transfers. min-mod assist gait. mod I WC propulsion.  endurance. activity tolerance. BLE strengthening. transfers. WC propulsion   Communication   Donning and Doffing PMSV with Mod A.  Mod I  Continue to reinforce independence with PMSV donning and doffing   Safety/Cognition/ Behavioral  Observations  Min to Mod A  Problem-solving (complex daily situations) - Mod I; Recall (daily complex information) - Mod I  Functional cognitive tasks r/t iADLs   Pain   On scheduled oxycodone for apin  <3/10. Assess q4 Hrly and prn.  Assess Q 4rly and prn   Skin   Tracheostomy and Stage 2 pressure injury to left gluteal fold  Promote wound healing.and prevent new skin breakdown.  Asssess Q4hly and prn     Discharge Planning:  Patient discharging home with assistance from sister. Patient spouse recently had a stroke   Team Discussion: Patient with proximal and distal weakness post critical illness neuropathy. Continue with #6 cuff-less trach; likely to go home with the trach Requires max assist for PMSV management. Patient limited by fatigue, anxiety. Functional gains limited by poor problem solving,  and poor recall. Stage 2 on coccyx with medihoney/gauze dressing.   Patient on target to meet rehab goals: Currently needs mod assist for slide-board for positioning of the board. Total assist for toileting. Mod assist needed for stand pivot and able to manage 4 steps with dorsi-flexion wraps.  Needs min - mod assist for cognition, problem solving and recall. Anticipate will be functional in w/c at discharge with min assist goals overall, min assist transfers using a slide-board.  *See Care Plan and progress notes for long and short-term goals.   Revisions to Treatment Plan:  Changed to 15/7 therapy schedule   Teaching Needs: Safety, trach care/management, medications, secondary risk management, transfers, toileting, skin care, etc.  Current Barriers to Discharge: Decreased caregiver support, Home enviroment access/layout, Lurline Idol,  and Incontinence  Possible Resolutions to Barriers: Family education with sister Luis Lopez follow up services     Medical Summary Current Status: has trach, poor GI transit, bilateral foot drop  Barriers to Discharge: Trach;Other (comments);New oxygen;Incontinence   Barriers to Discharge Comments: chronic urinary incont Possible Resolutions to Barriers/Weekly Focus: determine if there are trach weaning goals, cont to work on bowels   Continued Need for Acute Rehabilitation Level of Care: The patient requires daily medical management by a physician with specialized training in physical medicine and rehabilitation for the following reasons: Direction of a multidisciplinary physical rehabilitation program to maximize functional independence : Yes Medical management of patient stability for increased activity during participation in an intensive rehabilitation regime.: Yes Analysis of laboratory values and/or radiology reports with any subsequent need for medication adjustment and/or medical intervention. : Yes   I attest that I was present, lead the team conference, and concur with the assessment and plan of the team.   Dorien Chihuahua B 11/21/2021, 3:41 PM

## 2021-11-21 NOTE — Progress Notes (Signed)
Speech Language Pathology Daily Session Note  Patient Details  Name: [name] MRN: [id number] Date of Birth: 1956-01-25  Today's Date: 11/21/2021 SLP Individual Time: 1445-1530 SLP Individual Time Calculation (min): 45 min  Short Term Goals: Week 1: SLP Short Term Goal 1 (Week 1): Patient will demonstrate complex problem solving for functional tasks with supervision level verbal cues. SLP Short Term Goal 2 (Week 1): Patient will recal new, daily information with supervision level verbal cues for use of compensatory strategies SLP Short Term Goal 3 (Week 1): Patient will donn/doff PMSV with supervision level verbal and visual cues.  Skilled Therapeutic Interventions: Skilled ST treatment focused on cognitive goals. Pt was laying supine in bed on arrival. Trach capped for duration of session; SPO2 @ 93-94 during session; vocal quality perceived clear and strong. Therefore did not address PMSV goals and will likely discontinue with continued capping. Pt pleased. Pt continues to endorse mild memory changes which appear to be improving. SLP facilitated alternating attention and working memory task with overall  80% accuracy given sup A verbal cues for implementation of repetition and association strategies. Patient was left in bed with alarm activated and immediate needs within reach at end of session. Continue per current plan of care.      Pain Pain Assessment Pain Scale: 0-10 Pain Score: 0-No pain  Therapy/Group: Individual Therapy  Tamala Ser 11/21/2021, 3:46 PM

## 2021-11-22 ENCOUNTER — Encounter (HOSPITAL_COMMUNITY): Payer: Self-pay | Admitting: Physical Medicine & Rehabilitation

## 2021-11-22 ENCOUNTER — Inpatient Hospital Stay: Admit: 2021-11-22 | Payer: Medicare Other

## 2021-11-22 ENCOUNTER — Other Ambulatory Visit: Payer: Self-pay

## 2021-11-22 ENCOUNTER — Encounter (HOSPITAL_COMMUNITY): Payer: Medicare Other

## 2021-11-22 MED ORDER — SORBITOL 70 % SOLN
15.0000 mL | Freq: Every day | Status: DC | PRN
  Administered 2021-11-22: 15 mL via ORAL
  Filled 2021-11-22: qty 30

## 2021-11-22 MED ORDER — QUETIAPINE FUMARATE 50 MG PO TABS
50.0000 mg | ORAL_TABLET | Freq: Two times a day (BID) | ORAL | Status: DC
  Administered 2021-11-22 – 2021-11-28 (×12): 50 mg via ORAL
  Filled 2021-11-22 (×12): qty 1

## 2021-11-22 NOTE — Progress Notes (Signed)
PROGRESS NOTE   Subjective/Complaints:  DIscussed D/C date   ROS: Patient denies CP, SOB, N/V/D  Objective:   No results found. No results for input(s): "WBC", "HGB", "HCT", "PLT" in the last 72 hours.  No results for input(s): "NA", "K", "CL", "CO2", "GLUCOSE", "BUN", "CREATININE", "CALCIUM" in the last 72 hours.   Intake/Output Summary (Last 24 hours) at 11/22/2021 0811 Last data filed at 11/21/2021 1200 Gross per 24 hour  Intake 236 ml  Output --  Net 236 ml      Pressure Injury 11/03/21 Coccyx Medial Stage 2 -  Partial thickness loss of dermis presenting as a shallow open injury with a red, pink wound bed without slough. (Active)  11/03/21 1556  Location: Coccyx  Location Orientation: Medial  Staging: Stage 2 -  Partial thickness loss of dermis presenting as a shallow open injury with a red, pink wound bed without slough.  Wound Description (Comments):   Present on Admission:     Physical Exam: Vital Signs Blood pressure 124/78, pulse (!) 101, temperature 97.8 F (36.6 C), resp. rate 18, height 5\' 1"  (1.549 m), weight 48.5 kg, SpO2 98 %.    General: No acute distress Mood and affect are appropriate Heart: Regular rate and rhythm no rubs murmurs or extra sounds Lungs: Clear to auscultation, breathing unlabored, no rales or wheezes Abdomen: Positive bowel sounds, soft nontender to palpation, nondistended Extremities: No clubbing, cyanosis, or edema  Ext: no clubbing, cyanosis, or edema Psych: pleasant and cooperative   Skin: stage II coccyx 1cm with fibronecrotic debris in center Neurologic: Cranial nerves II through XII intact, motor strength is 4/5 in bilateral deltoid, bicep, tricep, grip,  trace hip flexor, knee extensors,0/5  ankle dorsiflexor and trace plantar flexor Sensory exam mildly reduced sensation to LT in feet, proprioception intact--no change  Musculoskeletal: Full range of motion in UE ,  limited AROM in LE due to weakness  No joint swelling   Assessment/Plan: 1. Functional deficits which require 3+ hours per day of interdisciplinary therapy in a comprehensive inpatient rehab setting. Physiatrist is providing close team supervision and 24 hour management of active medical problems listed below. Physiatrist and rehab team continue to assess barriers to discharge/monitor patient progress toward functional and medical goals  Care Tool:  Bathing    Body parts bathed by patient: Face         Bathing assist Assist Level: Supervision/Verbal cueing     Upper Body Dressing/Undressing Upper body dressing   What is the patient wearing?: Pull over shirt    Upper body assist Assist Level: Minimal Assistance - Patient > 75%    Lower Body Dressing/Undressing Lower body dressing      What is the patient wearing?: Incontinence brief     Lower body assist Assist for lower body dressing: Dependent - Patient 0%     Toileting Toileting    Toileting assist Assist for toileting: Dependent - Patient 0%     Transfers Chair/bed transfer  Transfers assist  Chair/bed transfer activity did not occur: N/A  Chair/bed transfer assist level: Dependent - Patient 0%     Locomotion Ambulation   Ambulation assist   Ambulation activity did not  occur: Safety/medical concerns          Walk 10 feet activity   Assist  Walk 10 feet activity did not occur: Safety/medical concerns        Walk 50 feet activity   Assist Walk 50 feet with 2 turns activity did not occur: Safety/medical concerns         Walk 150 feet activity   Assist Walk 150 feet activity did not occur: Safety/medical concerns         Walk 10 feet on uneven surface  activity   Assist Walk 10 feet on uneven surfaces activity did not occur: Safety/medical concerns         Wheelchair     Assist Is the patient using a wheelchair?: Yes (not tested during eval due to safety) Type of  Wheelchair: Manual Wheelchair activity did not occur: Safety/medical concerns         Wheelchair 50 feet with 2 turns activity    Assist    Wheelchair 50 feet with 2 turns activity did not occur: Safety/medical concerns       Wheelchair 150 feet activity     Assist  Wheelchair 150 feet activity did not occur: Safety/medical concerns       Blood pressure 124/78, pulse (!) 101, temperature 97.8 F (36.6 C), resp. rate 18, height 5\' 1"  (1.549 m), weight 48.5 kg, SpO2 98 %.  Medical Problem List and Plan: 1. 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  Antithrombotics: -DVT/anticoagulation:  Pharmaceutical: Lovenox             -antiplatelet therapy: N/a 3. Pain:  Managed by Dr.Phillips. Continue Methadone and tramadol-->tid to qid.  --Oxycodone 5 mg TID with tylenol prn for pain. 4. Mood: Team to provide ego support/encouragement. LCSW to follow for evaluation and support.              -antipsychotic agents:  N/A 5. Neuropsych: This patient is capable of making decisions on her own behalf. 6. Stage 2 decubitus ulcer: Routine pressure relief measures.              --continue air mattress overlay for MASD/Stage 2 decub.   6/11 begin medihoney to wound daily 7. Fluids/Electrolytes/Nutrition: Strict I/O. Continue regular diet             --Ensure supplements TID for severe malnutrition.    Latest Ref Rng & Units 11/19/2021    6:41 AM 11/16/2021    6:00 AM 11/15/2021   10:45 AM  BMP  Glucose 70 - 99 mg/dL 100  91  110   BUN 8 - 23 mg/dL 7  8  10    Creatinine 0.44 - 1.00 mg/dL 0.38  <0.30  0.32   Sodium 135 - 145 mmol/L 129  134  135   Potassium 3.5 - 5.1 mmol/L 4.2  4.2  3.9   Chloride 98 - 111 mmol/L 93  97  95   CO2 22 - 32 mmol/L 27  29  33   Calcium  8.9 - 10.3 mg/dL 8.6  8.5  8.2   Well hydrated  8. COPD w/VDRF/Trach dependent: Continue ATC 5-8  liters --followed by Dr. Delton Coombes.  --Keep saturation 88-92% per pulmonary --encourage pulmonary hygiene  --Continue Singulair, Yuperi, Brovana and Pulmicort nebs.  9. Leucocytosis: Down vs peak but still mildly elevated, afebrile ,cont to monitor  10. Panic attacks/Anxiety d/o: Klonopin weaned down 05/30 to  0.5 mg TID.             --Seroquel 75 mg BID. Continue ativan 1mg  tid prn (po/IM route), has not taken supplemental Ativan  11. Cardiomyopathy: Continue Metoprolol 12.5 mg bid and Crestor.              --aldactone added 06/07-->monitor for orthostatic symptoms.  No sign of failure 6/14 12. Ileus/intermittent issues w/N/V: Has been refusing miralax and senna intermittently. Will decrease to Miralax to daily w/Senna S daily             --Keep Mg>2.0 and K>4.0 for adequate supplementation/recurrence.               --hypokalemia--kdur 20 TID and check lytes Monday             --add Mg for supplement.    Multiple BMs after sorbitol 2 d ago but no BM yesterday , still on Reglan , no signs of ileus , may d/c, start sorbitol daily prn, has dulc supp 13. Pulmonary nodules: PCCM recommends repeat CT 6-8 weeks from 10/28/21 to monitor for resolution.  14. Interstitial cystitis s/p cystectomy and fistula repair: Followed by Dr.Evans.   15.  Dispo discussed need for assistance post d/c , husband reportedly undergoing chemotherapy treatment recently told he has Stage 4 , discuss in conf Wed , pt is anxious about care needs , has other family members who can assist   LOS: 8 days A FACE TO FACE EVALUATION WAS PERFORMED  02-10-1973 11/22/2021, 8:11 AM

## 2021-11-22 NOTE — Progress Notes (Signed)
Physical Therapy Weekly Progress Note  Patient Details  Name: [name] MRN: [id number] Date of Birth: [birth year]  Beginning of progress report period: November 15, 2021 End of progress report period: November 22, 2021  Today's Date: 11/22/2021 PT Individual Time: 5643-3295 and 1124-1202 and 1424-1505 PT Individual Time Calculation (min): 25 min and 38 min. And 41 min   Patient has met 4 of 4 short term goals.  Steady progress towards LTG of min assist overall. Currently requires mod assist for bed mobility and mod-max assist for slide board transfers and min-supervision assist for WC mobility. Pt currently able to tolerate standing up to 15 sec in parallel bars and perfomr gait up to 71f in parallel bars.   Patient continues to demonstrate the following deficits muscle weakness, muscle joint tightness, and muscle paralysis, decreased cardiorespiratoy endurance, abnormal tone, and decreased sitting balance, decreased standing balance, decreased postural control, and decreased balance strategies and therefore will continue to benefit from skilled PT intervention to increase functional independence with mobility.  Patient progressing toward long term goals..  Continue plan of care.  PT Short Term Goals Week 1:  PT Short Term Goal 1 (Week 1): Pt will complete bed mobility with mod A x 1 PT Short Term Goal 1 - Progress (Week 1): Met PT Short Term Goal 2 (Week 1): Pt will complete least restrictive transfer with assist x 1 PT Short Term Goal 2 - Progress (Week 1): Met PT Short Term Goal 3 (Week 1): Pt will initiate gait training as safe and able PT Short Term Goal 3 - Progress (Week 1): Met PT Short Term Goal 4 (Week 1): Pt will initiate w/c mobility as safe and able PT Short Term Goal 4 - Progress (Week 1): Met PT Short Term Goal 5 - Progress (Week 1): Other (comment) Week 2:  PT Short Term Goal 1 (Week 2): Pt will ambulate 131fwith mod assist and LRAD with +2 for WC follow PT Short Term Goal 2  (Week 2): Pt will propell  WC >15033fith supervision assist PT Short Term Goal 3 (Week 2): Pt will trasnfer to and from WC [facility] mod assist consistently with LRAD PT Short Term Goal 4 (Week 2): Pt will perform 5xSTS  Skilled Therapeutic Interventions/Progress Updates:  Session 1  Pt received supine in bed and agreeable to PT. Rolling R and L with min assist for positioning of LE each direction to doff/don brief following UI. Pt performed pericare suping. Donning pants with max assist through rolling R and L with min assist as listed. PT donned ted hose and grip socks in supine. BLE heel cord stretch 2 x 2.5 min Bil and hip flexion x 1 min bil. Supine>sit transfer with min assist through log roll with cues for use of BUE to push off rail from partial sitting position. Sitting balance EOB with supervision assist x 4 minutes with intermittent UE support and min assist to scoot posteriorly. Trade off with OT at end of session with pt sitting EOB    Session 2.    Pt received supine in bed and agreeable to PT. Supine>sit transfer with mod assist and min cues for log roll technique. [facility]ide [facility]-max assist for safety as slide board moving on air mattress.   WC mobility x [id number] to weave through 8 cones x [id number] for technique to prevent veer to the L as well as turning technique to the R.   Sit<>stand in parallel bars  x 2 with mod assist; standing tolerance 15sec and 10sec and pt reports mild dizziness for each.   Pt returned to room and performed slide board  transfer to bed with mod assist to block BLE. Sit>supine completed with mod assist at BLE and left supine in bed with call bell in reach and all needs met.   Session 3    Pt received supine in bed and agreeable to PT at bed level. PT performed manual therapy to perform PROM into DF and and for HS stretch 2 x 2 min each on BLE. Supine therex: SAQ, hip flexion/extension, hip adduction/adduction, clam shells into abduction  and then adduction with manual resistance, ankle DF with AAROM. SLR with AAROM. Each performed x 12 BLE with cues for decreased speed and full ROM. Pt falling asleep between bouts. Left supine in bed with call bell in reach and all needs met.     Therapy Documentation Precautions:  Precautions Precautions: Fall Precaution Comments: trach, B foot drop Restrictions Weight Bearing Restrictions: No  Vital Signs: Therapy Vitals Pulse Rate: 92 Resp: 18 Patient Position (if appropriate): Lying Oxygen Therapy SpO2: 99 % O2 Device: Tracheostomy Collar O2 Flow Rate (L/min): 5 L/min FiO2 (%): 28 % Pain: Session 1  Pain Assessment Pain Scale: 0-10 Pain Score: 0-No pain Session 2  Pain Assessment Pain Scale: 0-10 Pain Score: 0-No pain Session 3  Pain Assessment Pain Scale: 0-10 Pain Score: 0-No pain  Therapy/Group: Individual Therapy  Lorie Phenix 11/22/2021, 9:42 AM

## 2021-11-22 NOTE — Plan of Care (Signed)
  Problem: Consults Goal: RH GENERAL PATIENT EDUCATION Description: See Patient Education module for education specifics. Outcome: Progressing   Problem: RH BOWEL ELIMINATION Goal: RH STG MANAGE BOWEL WITH ASSISTANCE Description: STG Manage Bowel with mod I Assistance. Outcome: Progressing Goal: RH STG MANAGE BOWEL W/MEDICATION W/ASSISTANCE Description: STG Manage Bowel with Medication with mod I Assistance. Outcome: Progressing   Problem: RH BLADDER ELIMINATION Goal: RH STG MANAGE BLADDER WITH ASSISTANCE Description: STG Manage Bladder With toileting Assistance Outcome: Progressing   Problem: RH SKIN INTEGRITY Goal: RH STG MAINTAIN SKIN INTEGRITY WITH ASSISTANCE Description: STG Maintain Skin Integrity With min Assistance. Outcome: Progressing   Problem: RH SAFETY Goal: RH STG ADHERE TO SAFETY PRECAUTIONS W/ASSISTANCE/DEVICE Description: STG Adhere to Safety Precautions With cues Assistance/Device. Outcome: Progressing   Problem: RH PAIN MANAGEMENT Goal: RH STG PAIN MANAGED AT OR BELOW PT'S PAIN GOAL Description: At or below level 4 with prns Outcome: Progressing   Problem: RH KNOWLEDGE DEFICIT GENERAL Goal: RH STG INCREASE KNOWLEDGE OF SELF CARE AFTER HOSPITALIZATION Description: Patient and spouse will be able to manage care at discharge using handouts and educational resources independently Outcome: Progressing   Problem: RH KNOWLEDGE DEFICIT Goal: RH STG INCREASE KNOWLEGDE OF HYPERLIPIDEMIA Description: Patient and spouse will be able to manage HLD with medications and dietary modifications using handouts and educational resources independently Outcome: Progressing   

## 2021-11-22 NOTE — Progress Notes (Signed)
Patient ID: BREUNNA NORDMANN, female   DOB: 11-22-1955, 66 y.o.   MRN: [id number]  Team Conference Report to Patient/Family  Team Conference discussion was reviewed with the patient and caregiver, including goals, any changes in plan of care and target discharge date.  Patient and caregiver express understanding and are in agreement.  The patient has a target discharge date of 12/07/21.  Sw met with patient and spoke with sister via telephone to provide team conference updates. Patient and sister concerned about patient discharging home with the trach. Sw will update patient and sister once sw is updated of the plan for the trach. Patient and sister informed patient is anticipated to reach wheelchair level goals and ambulate short distances.   Dyanne Iha 11/22/2021, 2:19 PM

## 2021-11-22 NOTE — Progress Notes (Signed)
Occupational Therapy Weekly Progress Note  Patient Details  Name: [name] MRN: [id number] Date of Birth: 06-01-1956  Beginning of progress report period: November 15, 2021 End of progress report period: November 22, 2021  Today's Date: 11/22/2021 OT Individual Time: 0930-1040 OT Individual Time Calculation (min): 70 min    Patient has met 2 of 3 short term goals.  Pt has made slow but steady progress this week towards LTG. Pt has demonstrated improved bed mobility, ability to come into standing,activity tolerance, functional strength of BUE/BLE to presently complete seated UB ADL at set-up A level, BSC transfers via stand-pivot with max A, LBD with total A of 2, total A of 2 for toileting tasks. Pt cont to be primarily limited by poor activity tolerance, BLE weakness/B foot drop, and anxiousness with mobility. Anticipate intermittent S and min to mod physical assist required upon DC for BADL. Family education will be completed most likely with sister closer to DC.   Patient continues to demonstrate the following deficits: muscle weakness, decreased cardiorespiratoy endurance and decreased oxygen support, impaired timing and sequencing, unbalanced muscle activation, and decreased coordination, decreased memory, and decreased sitting balance, decreased standing balance, decreased postural control, and decreased balance strategies and therefore will continue to benefit from skilled OT intervention to enhance overall performance with BADL, iADL, and Reduce care partner burden.  Patient progressing toward long term goals..  Continue plan of care.  OT Short Term Goals Week 1:  OT Short Term Goal 1 (Week 1): Pt will complete toilet transfer with max A and LRAD. OT Short Term Goal 1 - Progress (Week 1): Met OT Short Term Goal 2 (Week 1): Pt will complete sit to stand with min A of 2 and LRAD in prep for standing ADL. OT Short Term Goal 2 - Progress (Week 1): Not met OT Short Term Goal 3 (Week 1): Pt  will don pants with max A of 1. OT Short Term Goal 3 - Progress (Week 1): Met Week 2:  OT Short Term Goal 1 (Week 2): Pt will complete sit to stand with min A of 2 and LRAD in prep for standing ADL. OT Short Term Goal 2 (Week 2): Pt will complete 1/3 toileting tasks with mod A. OT Short Term Goal 3 (Week 2): Pt will complete standing grooming task at sink with max A. OT Short Term Goal 4 (Week 2): Pt will don pants with mod A.  Skilled Therapeutic Interventions/Progress Updates:    Pt received seated EOB with PT, no c/o pain and, agreeable to therapy. Session focus on self-care retraining, activity tolerance, BUE/BLE strengthening in prep for improved ADL/IADL/func mobility performance + decreased caregiver burden. SatO2 on RA at 94%, HR at 108.  SB transfer > w/c on her L with total A to place/remove board, min A only to initially get on board, then completing rest of transfer with CGA.  Completed oral care seated at sink with set-up A. Brushed hair with mod I and assist from therapist to braid hair. Self-propelled w/c with BUE > Nurses station with close S, transported remainder of way due to fatigue.   SB transfer > nustep with min A of 2 to stabilize board and boost uphill. Pt completed 10 total min at level 1 resistance with average step length/min of 29 and 4 rest breaks. SatO2 reading at 84% on RA, but poor perfusion.   SB transfer back to bed with total A to place board, CGA for completed transfer. Mod A to progress  BLE back to bed. SatO2 at 96% and HR at 97 bpm post activity.   Pt completed 2x10 of the following with red theraband/pillow: hip abduction, adduction, straight leg raises with assist, and forward B punches.  Pt left with HOB over 30 degrees, call bell in reach, and all immediate needs met.  Sat O2 at 95 on RA, HR at 98 bpm.    Therapy Documentation Precautions:  Precautions Precautions: Fall Precaution Comments: trach, B foot drop Restrictions Weight Bearing  Restrictions: No  Pain:  No c/o throughout but endorses fatigue ADL: See Care Tool for more details.  Therapy/Group: Individual Therapy  Volanda Napoleon MS, OTR/L  11/22/2021, 6:48 AM

## 2021-11-22 NOTE — Progress Notes (Signed)
Pt had an overall good night. Slept through out the night. no complaints of pain. O2 sats remained stable 94-97%.   [name]

## 2021-11-22 NOTE — Progress Notes (Signed)
   NAME:  [name], MRN:  [id number], DOB:  13-Mar-1956, LOS: 8 ADMISSION DATE:  11/14/2021, CONSULTATION DATE:  4/18 REFERRING MD:  Tat/ triad, CHIEF COMPLAINT:  resp distress    History of Present Illness:  66 y.o. female quit smoking 2021 with  GOLD 3 COPD MZ, uncontrolled anxiety. Was on 02 one year PTA but "took herself off" per friend at bedside and able to care for pets at home but mostly housebound sinc ethen  She was admitted 4/17 to [facility], ICU with acute hypoxic/hypercarbic respiratory failure, failed BiPAP and required mechanical ventilation, transferred to [facility] 4/21 due to new finding of LV dysfunction.  Significant Hospital Events: Including procedures, antibiotic start and stop dates in addition to other pertinent events   ET  4/18 c/b hypotension with severe air trapping  Echo 4/20 with EF 40% , akinesis of apex and septal wall 4/24 CTA head negative for any significant abnormality 4/25 ketamine added 4/28 increased work of breathing during the wean, severe auto PEEP 4/29 Febrile 101.2 5/1 perc trach placement 5/15 trach changed to Shiley #6 cuffless 5/16 antibiotics resumed for inreasing resp distress, leukocytosis, increased LA 5/30 ileus/SBO 6/14 reevaluated in CIR  >>trach capping  Interim History / Subjective:   Trach Since yesterday. No dyspnea. She has a good cough. Sat 95% on room air   Objective   Blood pressure 124/78, pulse 92, temperature 97.8 F (36.6 C), resp. rate 18, height 5\' 1"  (1.549 m), weight 48.5 kg, SpO2 99 %.    FiO2 (%):  [28 %] 28 %   Intake/Output Summary (Last 24 hours) at 11/22/2021 1204 Last data filed at 11/22/2021 0836 Gross per 24 hour  Intake 240 ml  Output --  Net 240 ml    Filed Weights   11/16/21 2134 11/17/21 1000  Weight: 60.8 kg 48.5 kg   Examination: General: Acute on chronic ill appearing thin middle aged femal lying in bed, in NAD HEENT: 6 cuffed shiley capped, MM pink/moist, PERRL, able to phonate  well Neuro: Alert and oriented x3, non-focal  CV: s1s2 regular rate and rhythm, no murmur, rubs, or gallops,  PULM: Decreased breath sounds bilateral, no accessory muscle use GI: soft, bowel sounds active in all 4 quadrants, non-tender, non-distended, tolerating oral diet  Extremities: warm/dry, no edema  Skin: no rashes or lesions  Assessment & Plan:   Acute hypoxemic/hypercapnic respiratory failure Tracheostomy with vent dependent respiratory failure -5/1 perc trach placement -5/15 trach changed to Shiley #6 cuffless -6/14 trach capped Treated for Klebsiella pneumonia  -Proceed with decannulation  Severe protein calorie malnutrition Dysphagia  P: Proceed with decannulation Continue to encourage pulmonary hygiene Continue aggressive PT  Continue BDs  Pulmonary nodules  CT chest without contrast can be obtained in August as outpatient  Severe anxiety -Seroquel started during ICU stay, can be tapered to off, have decreased to 50 twice daily for now -Clonazepam can be switched to home dose of Xanax  PCCM will be available as needed if she does well with decannulation  September MD. FCCP. [facility] Pager : [phone]  If no response to pager , please call [phone] until 7 pm After 7:00 pm call Elink  [phone]   11/22/2021   11/22/2021, 12:04 PM

## 2021-11-23 MED ORDER — CLONAZEPAM 0.25 MG PO TBDP
0.2500 mg | ORAL_TABLET | Freq: Three times a day (TID) | ORAL | Status: DC
  Administered 2021-11-23 – 2021-11-28 (×15): 0.25 mg via ORAL
  Filled 2021-11-23 (×15): qty 1

## 2021-11-23 MED ORDER — ALPRAZOLAM 0.5 MG PO TABS
0.5000 mg | ORAL_TABLET | Freq: Three times a day (TID) | ORAL | Status: DC | PRN
  Administered 2021-12-03 – 2021-12-12 (×9): 0.5 mg via ORAL
  Filled 2021-11-23 (×10): qty 1

## 2021-11-23 NOTE — Plan of Care (Signed)
  Problem: Consults Goal: RH GENERAL PATIENT EDUCATION Description: See Patient Education module for education specifics. Outcome: Progressing   Problem: RH BOWEL ELIMINATION Goal: RH STG MANAGE BOWEL WITH ASSISTANCE Description: STG Manage Bowel with mod I Assistance. Outcome: Progressing Goal: RH STG MANAGE BOWEL W/MEDICATION W/ASSISTANCE Description: STG Manage Bowel with Medication with mod I Assistance. Outcome: Progressing   Problem: RH BLADDER ELIMINATION Goal: RH STG MANAGE BLADDER WITH ASSISTANCE Description: STG Manage Bladder With toileting Assistance Outcome: Progressing   Problem: RH SKIN INTEGRITY Goal: RH STG MAINTAIN SKIN INTEGRITY WITH ASSISTANCE Description: STG Maintain Skin Integrity With min Assistance. Outcome: Progressing   Problem: RH SAFETY Goal: RH STG ADHERE TO SAFETY PRECAUTIONS W/ASSISTANCE/DEVICE Description: STG Adhere to Safety Precautions With cues Assistance/Device. Outcome: Progressing   Problem: RH PAIN MANAGEMENT Goal: RH STG PAIN MANAGED AT OR BELOW PT'S PAIN GOAL Description: At or below level 4 with prns Outcome: Progressing   Problem: RH KNOWLEDGE DEFICIT GENERAL Goal: RH STG INCREASE KNOWLEDGE OF SELF CARE AFTER HOSPITALIZATION Description: Patient and spouse will be able to manage care at discharge using handouts and educational resources independently Outcome: Progressing   Problem: RH KNOWLEDGE DEFICIT Goal: RH STG INCREASE KNOWLEGDE OF HYPERLIPIDEMIA Description: Patient and spouse will be able to manage HLD with medications and dietary modifications using handouts and educational resources independently Outcome: Progressing   

## 2021-11-23 NOTE — Progress Notes (Signed)
Occupational Therapy Session Note  Patient Details  Name: [name] MRN: [id number] Date of Birth: 1955/07/16  Today's Date: 11/23/2021 OT Individual Time: 8588-5027 OT Individual Time Calculation (min): 73 min    Short Term Goals: Week 1:  OT Short Term Goal 1 (Week 1): Pt will complete toilet transfer with max A and LRAD. OT Short Term Goal 1 - Progress (Week 1): Met OT Short Term Goal 2 (Week 1): Pt will complete sit to stand with min A of 2 and LRAD in prep for standing ADL. OT Short Term Goal 2 - Progress (Week 1): Not met OT Short Term Goal 3 (Week 1): Pt will don pants with max A of 1. OT Short Term Goal 3 - Progress (Week 1): Met Week 2:  OT Short Term Goal 1 (Week 2): Pt will complete sit to stand with min A of 2 and LRAD in prep for standing ADL. OT Short Term Goal 2 (Week 2): Pt will complete 1/3 toileting tasks with mod A. OT Short Term Goal 3 (Week 2): Pt will complete standing grooming task at sink with max A. OT Short Term Goal 4 (Week 2): Pt will don pants with mod A.  Skilled Therapeutic Interventions/Progress Updates:    Pt received semi-reclined in bed, no c/o pain, agreeable to therapy. Session focus on self-care retraining, activity tolerance, BUE/BLE/core strenthening in prep for improved ADL/IADL/func mobility performance + decreased caregiver burden.  Sat O2 at 93% on RA, HR at 1043.  Reports having been incontinent of bladder. Brief saturated with urine. Total A to remove/don new brief, pt able to complete anterior pericare with set-up A in supine, able to roll L with S.   Came to sitting EOB with mod A to progress BLE and to lift trunk. Total A to place board, +2 present to stabilize board as pt completed transfer with min A.   Washed face/brushed hair set-up A at sink. Donned shirt with min A to pull down in back. Total A of 2 to don pants with max BHHA of 2 to complete sit to stand from edge of bed. Total A to don B teds/socks.   Pt self-propelled w/c  > nurses station and later in downstairs hallway for 25 ft with close S. Transported remainder of way to outdoor area due to fatigue. Pt completed 2x10 forward/backward circles, chest press, volley ball toss with 2 lb dowel rod. Completed 2x10 seated marches with 1.5 lb ankle weights, only able to raise BLE ~ half inch from ground.  Finally, after completed SB transfer with mod A due to fatigue to and from mat table, pt completed 2x5 modified sit-ups against raised incline with extended rest break in between and overall min A.  Sat O2 at 99%, HR at 102 bpm at end of session.  Pt left with HOB over 30 degrees with , call bell in reach, and all immediate needs met.    Therapy Documentation Precautions:  Precautions Precautions: Fall Precaution Comments: trach, B foot drop Restrictions Weight Bearing Restrictions: No  Pain: no c/o   ADL: See Care Tool for more details.  Therapy/Group: Individual Therapy  Volanda Napoleon MS, OTR/L  11/23/2021, 6:51 AM

## 2021-11-23 NOTE — Plan of Care (Signed)
  Problem: RH Pre-functional/Other (Specify) Goal: RH LTG SLP (Specify) 1 Description: Patient will independently donn/doff the PMSV.  Outcome: Not Applicable Note: Goal no longer applicable - trach capped 6/14, decannulated as of 6/16

## 2021-11-23 NOTE — Progress Notes (Signed)
Speech Language Pathology Weekly Progress and Session Note  Patient Details  Name: [name] MRN: [id number] Date of Birth: Apr 08, 1956  Beginning of progress report period: November 15, 2021 End of progress report period: November 23, 2021  Today's Date: 11/23/2021 SLP Individual Time: 1300-1310 SLP Individual Time Calculation (min): 10 min and Today's Date: 11/23/2021 SLP Missed Time: 35 Minutes Missed Time Reason: Other (Comment);Patient fatigue (stomach discomfort)  Short Term Goals: Week 1: SLP Short Term Goal 1 (Week 1): Patient will demonstrate complex problem solving for functional tasks with supervision level verbal cues. SLP Short Term Goal 1 - Progress (Week 1): Other (comment) (Have not formally addressed) SLP Short Term Goal 2 (Week 1): Patient will recal new, daily information with supervision level verbal cues for use of compensatory strategies SLP Short Term Goal 2 - Progress (Week 1): Met SLP Short Term Goal 3 (Week 1): Patient will donn/doff PMSV with supervision level verbal and visual cues. SLP Short Term Goal 3 - Progress (Week 1): Discontinued (comment) (trach capped as of 6/14; decannulated 6/16)  New Short Term Goals: Week 2: SLP Short Term Goal 1 (Week 2): Patient will demonstrate complex problem solving for functional tasks with supervision level verbal cues. SLP Short Term Goal 2 (Week 2): Patient will recal new, daily information with mod I for use of compensatory strategies  Weekly Progress Updates: Skilled ST treatment focused on cognitive goals. Pt laying supine in bed on arrival and reported feeling "exhausted." Lurline Idol was decannulated this a.m. Vitals remained stable and pt tolerating well with intermittent air escape from stoma. SLP educated on breath support techniques and positioning to improve vocal quality and intensity. Pt demonstrated understanding through modifying positioning and increasing breath support for conversational speech successfully however still  limited d/t slight air escape during exhalation. Pt initially agreeable to ST intervention however soon requested to discontinue d/t fatigue and also reporting stomach discomfort. Nurse aware with plans to administer suppository. Patient was left in bed with nurse at bedside. Continue per current plan of care. Pt missed 35 minutes of skilled ST intervention.   Intensity: Minumum of 1-2 x/day, 30 to 90 minutes Frequency: 3 to 5 out of 7 days Duration/Length of Stay: 5/30 but may be shorter for SLP Treatment/Interventions: Cognitive remediation/compensation;Internal/external aids;Cueing hierarchy;Environmental controls;Therapeutic Activities;Functional tasks;Patient/family education   Daily Session Skilled Therapeutic Interventions: Patient has made functional gains and has met 1 of 2 STGs this reporting period. PMSV goal discontinued d/t trach being capped as of 6/14 and decannulated on 6/16. Problem solving goal was not formally addressed due to focus on PMSV and short-term recall goals. Pt is currently completing memory tasks with sup A cues for use of compensatory strategies. Pt reports being near cognitive baseline however feeling below baseline in regards to memory and problem solving skills. Education is ongoing. Patient would benefit from continued skilled SLP intervention to maximize cognitive functioning and overall functional independence prior to discharge. Anticipate patient will be discharged from [facility] services within the next reporting period due to steady progress toward goals and mild deficits. Will continue to assess for appropriateness.       General    Pain  None  Therapy/Group: Individual Therapy  Shaday Rayborn T Sharilynn Cassity 11/23/2021, 5:00 PM

## 2021-11-23 NOTE — Progress Notes (Signed)
PROGRESS NOTE   Subjective/Complaints:  Appreciate pulmonary note, no issues overnite, plan for decannulation today per CCM/RT  ROS: Patient denies CP, SOB, N/V/D  Objective:   No results found. No results for input(s): "WBC", "HGB", "HCT", "PLT" in the last 72 hours.  No results for input(s): "NA", "K", "CL", "CO2", "GLUCOSE", "BUN", "CREATININE", "CALCIUM" in the last 72 hours.   Intake/Output Summary (Last 24 hours) at 11/23/2021 0830 Last data filed at 11/22/2021 1834 Gross per 24 hour  Intake 960 ml  Output --  Net 960 ml      Pressure Injury 11/03/21 Coccyx Medial Stage 2 -  Partial thickness loss of dermis presenting as a shallow open injury with a red, pink wound bed without slough. (Active)  11/03/21 1556  Location: Coccyx  Location Orientation: Medial  Staging: Stage 2 -  Partial thickness loss of dermis presenting as a shallow open injury with a red, pink wound bed without slough.  Wound Description (Comments):   Present on Admission:     Physical Exam: Vital Signs Blood pressure 111/60, pulse (!) 102, temperature 98.3 F (36.8 C), resp. rate 19, height 5\' 1"  (1.549 m), weight 48.5 kg, SpO2 96 %.    General: No acute distress Mood and affect are appropriate Heart: Regular rate and rhythm no rubs murmurs or extra sounds Lungs: Clear to auscultation, breathing unlabored, no rales or wheezes Abdomen: Positive bowel sounds, soft nontender to palpation, nondistended Extremities: No clubbing, cyanosis, or edema  Ext: no clubbing, cyanosis, or edema Psych: pleasant and cooperative   Skin: stage II coccyx 1cm with fibronecrotic debris in center Neurologic: Cranial nerves II through XII intact, motor strength is 4/5 in bilateral deltoid, bicep, tricep, grip,  trace hip flexor, knee extensors,0/5  ankle dorsiflexor and trace plantar flexor Sensory exam mildly reduced sensation to LT in feet, proprioception  intact--no change  Musculoskeletal: Full range of motion in UE , limited AROM in LE due to weakness  No joint swelling   Assessment/Plan: 1. Functional deficits which require 3+ hours per day of interdisciplinary therapy in a comprehensive inpatient rehab setting. Physiatrist is providing close team supervision and 24 hour management of active medical problems listed below. Physiatrist and rehab team continue to assess barriers to discharge/monitor patient progress toward functional and medical goals  Care Tool:  Bathing    Body parts bathed by patient: Face         Bathing assist Assist Level: Supervision/Verbal cueing     Upper Body Dressing/Undressing Upper body dressing   What is the patient wearing?: Pull over shirt    Upper body assist Assist Level: Minimal Assistance - Patient > 75%    Lower Body Dressing/Undressing Lower body dressing      What is the patient wearing?: Incontinence brief     Lower body assist Assist for lower body dressing: Dependent - Patient 0%     Toileting Toileting    Toileting assist Assist for toileting: Dependent - Patient 0%     Transfers Chair/bed transfer  Transfers assist  Chair/bed transfer activity did not occur: N/A  Chair/bed transfer assist level: Dependent - Patient 0%     Locomotion Ambulation  Ambulation assist   Ambulation activity did not occur: Safety/medical concerns          Walk 10 feet activity   Assist  Walk 10 feet activity did not occur: Safety/medical concerns        Walk 50 feet activity   Assist Walk 50 feet with 2 turns activity did not occur: Safety/medical concerns         Walk 150 feet activity   Assist Walk 150 feet activity did not occur: Safety/medical concerns         Walk 10 feet on uneven surface  activity   Assist Walk 10 feet on uneven surfaces activity did not occur: Safety/medical concerns         Wheelchair     Assist Is the patient using  a wheelchair?: Yes (not tested during eval due to safety) Type of Wheelchair: Manual Wheelchair activity did not occur: Safety/medical concerns         Wheelchair 50 feet with 2 turns activity    Assist    Wheelchair 50 feet with 2 turns activity did not occur: Safety/medical concerns       Wheelchair 150 feet activity     Assist  Wheelchair 150 feet activity did not occur: Safety/medical concerns       Blood pressure 111/60, pulse (!) 102, temperature 98.3 F (36.8 C), resp. rate 19, height 5\' 1"  (1.549 m), weight 48.5 kg, SpO2 96 %.  Medical Problem List and Plan: 1. 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  Antithrombotics: -DVT/anticoagulation:  Pharmaceutical: Lovenox             -antiplatelet therapy: N/a 3. Pain:  Managed by Dr.Phillips. Off Methadone and tramadol- --Oxycodone 5 mg TID with tylenol prn for pain.consider transition to tramadol would avoid methadone due to pulmonary depression issues, pharmacodynamics  4. Mood: Team to provide ego support/encouragement. LCSW to follow for evaluation and support.              -antipsychotic agents:  N/A 5. Neuropsych: This patient is capable of making decisions on her own behalf. 6. Stage 2 decubitus ulcer: Routine pressure relief measures.              --continue air mattress overlay for MASD/Stage 2 decub.   6/11 begin medihoney to wound daily 7. Fluids/Electrolytes/Nutrition: Strict I/O. Continue regular diet             --Ensure supplements TID for severe malnutrition.    Latest Ref Rng & Units 11/19/2021    6:41 AM 11/16/2021    6:00 AM 11/15/2021   10:45 AM  BMP  Glucose 70 - 99 mg/dL 01/15/2022  91  272   BUN 8 - 23 mg/dL 7  8  10    Creatinine 0.44 - 1.00 mg/dL 536   6.44   Sodium 135 -  145 mmol/L 129  134  135   Potassium 3.5 - 5.1 mmol/L 4.2  4.2  3.9   Chloride 98 - 111 mmol/L 93  97  95   CO2 22 - 32 mmol/L 27  29  33   Calcium 8.9 - 10.3  mg/dL 8.6  8.5  8.2   Well hydrated  8. COPD w/VDRF/Trach dependent: Continue ATC 5-8 liters --followed by Dr. Lamonte Sakai.  --Keep saturation 88-92% per pulmonary --encourage pulmonary hygiene  --Continue Singulair, Yuperi, Brovana and Pulmicort nebs.  9. Leucocytosis: Down vs peak but still elevated, afebrile ,cont to monitor     Latest Ref Rng & Units 11/19/2021    6:41 AM 11/15/2021   10:45 AM 11/14/2021    6:06 AM  CBC  WBC 4.0 - 10.5 K/uL 16.5  14.9  15.4   Hemoglobin 12.0 - 15.0 g/dL 9.9  10.0  9.6   Hematocrit 36.0 - 46.0 % 31.6  32.0  29.8   Platelets 150 - 400 K/uL 426  426  350     10. Panic attacks/Anxiety d/o: Klonopin weaned down 05/30 to  0.5 mg TID.6/16 down to .25mg  TID , D/C ativan, resume prn xanax             --Seroquel 75 mg BID. Reduced to 50mg  BID  - appreciate CCM input  11. Cardiomyopathy: Continue Metoprolol 12.5 mg bid and Crestor.              --aldactone added 06/07-->monitor for orthostatic symptoms.  No sign of failure 6/14 12. Ileus/intermittent issues w/N/V: Has been refusing miralax and senna intermittently. Will decrease to Miralax to daily w/Senna S daily             --Keep Mg>2.0 and K>4.0 for adequate supplementation/recurrence.               --hypokalemia--kdur 20 TID and check lytes Monday             --add Mg for supplement.    Multiple BMs after sorbitol 2 d ago but no BM yesterday , still on Reglan , no signs of ileus , may d/c, start sorbitol daily prn, has dulc supp 13. Pulmonary nodules: PCCM recommends repeat CT 6-8 weeks from 10/28/21 to monitor for resolution.  14. Interstitial cystitis s/p cystectomy and fistula repair: Followed by Dr.Evans.   51.  Dispo discussed need for assistance post d/c , husband reportedly undergoing chemotherapy treatment recently told he has Stage 4 , discuss in  conf Wed , pt is anxious about care needs , has other family members who can assist   LOS: 9 days A FACE TO FACE EVALUATION WAS PERFORMED  [name] 11/23/2021, 8:30 AM

## 2021-11-23 NOTE — Progress Notes (Signed)
Occupational Therapy Session Note  Patient Details  Name: [name] MRN: [id number] Date of Birth: Apr 24, 1956  Today's Date: 11/23/2021 OT Individual Time: 1130-1200 OT Individual Time Calculation (min): 30 min    Short Term Goals: Week 2:  OT Short Term Goal 1 (Week 2): Pt will complete sit to stand with min A of 2 and LRAD in prep for standing ADL. OT Short Term Goal 2 (Week 2): Pt will complete 1/3 toileting tasks with mod A. OT Short Term Goal 3 (Week 2): Pt will complete standing grooming task at sink with max A. OT Short Term Goal 4 (Week 2): Pt will don pants with mod A.  Skilled Therapeutic Interventions/Progress Updates:    S:    O: - Utilized yoga block to assist with achieving bilateral ankle flexion of 90 degrees or as close as possible.  - Strengthening: Supine, bilateral calves, Utilized blue playground ball for feedback to complete resisted heel pumps 10X.  - AA/ROM: bilateral hip flexion/extension, 10X, supine - Strengthening: Glute bridges, 10X, supine. Therapist supporting patient at knees and ankles. Then completed 10 glute bridges while holding and squeezing yoga block in between knees for increased hip adductor strength.  -Hip Abductors, red band loop, 10X, supine   A: Skilled OT session completed in patient's room while focusing on lower extremity ROM, flexibility, and strength in order to carry over in lower body bathing/dressing, functional transfers, and functional performance during static standing. Encouraged, patient to wear PRAFOS when in bed at all times to decrease muscle tone in her ankles and help improve her ability to stand and transfer during therapy sessions. Left side hip flexors appear weaker than right this session. Pt does present with decreased muscle tone in bilateral ankles although remains with bilateral feet in a planter flexed position at rest.    P: Work on increasing activity tolerance and ability to remain out of bed for longer  periods of time.   Therapy Documentation Precautions:  Precautions Precautions: Fall Precaution Comments: trach, B foot drop Restrictions Weight Bearing Restrictions: No  Pain:  0/10 pain level reported.   Therapy/Group: Individual Therapy  Limmie Patricia, OTR/L,CBIS  Supplemental OT - [facility]  11/23/2021, 8:02 AM

## 2021-11-23 NOTE — Progress Notes (Signed)
Physical Therapy Session Note  Patient Details  Name: [name] MRN: [id number] Date of Birth: 05/06/1956  Today's Date: 11/23/2021 PT Individual Time: 1347-1410 and 1500-1525 PT Individual Time Calculation (min): 25 min AND 23 MIN    Short Term Goals: Week 1:  PT Short Term Goal 1 (Week 1): Pt will complete bed mobility with mod A x 1 PT Short Term Goal 1 - Progress (Week 1): Met PT Short Term Goal 2 (Week 1): Pt will complete least restrictive transfer with assist x 1 PT Short Term Goal 2 - Progress (Week 1): Met PT Short Term Goal 3 (Week 1): Pt will initiate gait training as safe and able PT Short Term Goal 3 - Progress (Week 1): Met PT Short Term Goal 4 (Week 1): Pt will initiate w/c mobility as safe and able PT Short Term Goal 4 - Progress (Week 1): Met PT Short Term Goal 5 - Progress (Week 1): Other (comment) Week 2:  PT Short Term Goal 1 (Week 2): Pt will ambulate 66f with mod assist and LRAD with +2 for WC follow PT Short Term Goal 2 (Week 2): Pt will propell  WC >1540fwith supervision assist PT Short Term Goal 3 (Week 2): [facility] with LRAD PT Short Term Goal 4 (Week 2): Pt will perform 5xSTS Week 3:     Skilled Therapeutic Interventions/Progress Updates:   Pt received supine in bed and agreeable to PT at bed level reporting that she had just received suppository and may need to have BM soon.  SUPINE therex to performed SLR x 10 BLE with AAROM, hip abduction/ adduction x 10 and hip flexion/extension x 5 BLE with slight resistance. Pt Then reports need for bed pan urgently for BM.. Marland Kitchen Rolling R and L to place pand and doff brief.   Left on bed pan.   Returned in 50 min and pt reports that BM successful , but staff had been unresponsive to calls from pt to remove pan. Rolling R and L to remove pan, rolling R and L for hygiene and clothing management. Scooting in bed. PT donned PRAFO on BLE. Left in bed with call bell  in reach and all needs met.      Therapy Documentation Precautions:  Precautions Precautions: Fall Precaution Comments: trach, B foot drop Restrictions Weight Bearing Restrictions: No General:   Vital Signs: Therapy Vitals Temp: 98.5 F (36.9 C) Pulse Rate: 98 Resp: 17 BP: (!) 110/54 Patient Position (if appropriate): Lying Oxygen Therapy SpO2: 95 % O2 Device: Room Air O2 Flow Rate (L/min): 0 L/min FiO2 (%): (!) 0 % Pain:   Mobility:   Locomotion :    Trunk/Postural Assessment :    Balance:   Exercises:   Other Treatments:      Therapy/Group: Individual Therapy  AuLorie Phenix/16/2023, 3:33 PM

## 2021-11-23 NOTE — Progress Notes (Signed)
   NAME:  [name], MRN:  [id number], DOB:  April 13, 1956, LOS: 9 ADMISSION DATE:  11/14/2021, CONSULTATION DATE:  4/18 REFERRING MD:  Tat/ triad, CHIEF COMPLAINT:  resp distress    History of Present Illness:  66 y.o. female quit smoking 2021 with  GOLD 3 COPD MZ, uncontrolled anxiety. Was on 02 one year PTA but "took herself off" per friend at bedside and able to care for pets at home but mostly housebound sinc ethen  She was admitted 4/17 to [facility], ICU with acute hypoxic/hypercarbic respiratory failure, failed BiPAP and required mechanical ventilation, transferred to [facility] 4/21 due to new finding of LV dysfunction.  Significant Hospital Events: Including procedures, antibiotic start and stop dates in addition to other pertinent events   ET  4/18 c/b hypotension with severe air trapping  Echo 4/20 with EF 40% , akinesis of apex and septal wall 4/24 CTA head negative for any significant abnormality 4/25 ketamine added 4/28 increased work of breathing during the wean, severe auto PEEP 4/29 Febrile 101.2 5/1 perc trach placement 5/15 trach changed to Shiley #6 cuffless 5/16 antibiotics resumed for inreasing resp distress, leukocytosis, increased LA 5/30 ileus/SBO 6/14 reevaluated in CIR  >>trach capping  Interim History / Subjective:  No events. Tolerating capping trial.  Objective   Blood pressure 111/60, pulse (!) 102, temperature 98.3 F (36.8 C), resp. rate 19, height 5\' 1"  (1.549 m), weight 48.6 kg, SpO2 96 %.        Intake/Output Summary (Last 24 hours) at 11/23/2021 1456 Last data filed at 11/23/2021 1315 Gross per 24 hour  Intake 1440 ml  Output --  Net 1440 ml    Filed Weights   11/16/21 2134 11/17/21 1000 11/23/21 0500  Weight: 60.8 kg 48.5 kg 48.6 kg   Examination: Thin woman in NAD Phonating well Ext warm Lung sounds diminished bilaterally  Assessment & Plan:   Acute hypoxemic/hypercapnic respiratory failure Tracheostomy with vent dependent  respiratory failure -5/1 perc trach placement -5/15 trach changed to Shiley #6 cuffless -6/14 trach capped Treated for Klebsiella pneumonia  -Proceed with decannulation (unclear why not done yesterday)  Severe protein calorie malnutrition Dysphagia  P: Proceed with decannulation Continue to encourage pulmonary hygiene Continue aggressive PT  Continue BDs  Pulmonary nodules  CT chest without contrast can be obtained in August as outpatient  Severe anxiety -Seroquel started during ICU stay, can be tapered to off, have decreased to 50 twice daily for now -Clonazepam can be switched to home dose of Xanax  PCCM will be available as needed  September MD PCCM

## 2021-11-23 NOTE — Progress Notes (Addendum)
Pt decannulated per order and being capped. Gauze was placed over the stoma with tape. Pt's vitals remain stable and is tolerating well. RT will monitor.

## 2021-11-24 DIAGNOSIS — D72829 Elevated white blood cell count, unspecified: Secondary | ICD-10-CM

## 2021-11-24 DIAGNOSIS — K567 Ileus, unspecified: Secondary | ICD-10-CM

## 2021-11-24 DIAGNOSIS — J441 Chronic obstructive pulmonary disease with (acute) exacerbation: Secondary | ICD-10-CM

## 2021-11-24 NOTE — Progress Notes (Signed)
PROGRESS NOTE   Subjective/Complaints:  She was decannulated.  BM yesterday.  ROS: Patient denies CP, SOB, N/V/D, Cough  Objective:   No results found. No results for input(s): "WBC", "HGB", "HCT", "PLT" in the last 72 hours.  No results for input(s): "NA", "K", "CL", "CO2", "GLUCOSE", "BUN", "CREATININE", "CALCIUM" in the last 72 hours.   Intake/Output Summary (Last 24 hours) at 11/24/2021 1355 Last data filed at 11/24/2021 1300 Gross per 24 hour  Intake 600 ml  Output --  Net 600 ml      Pressure Injury 11/03/21 Coccyx Medial Stage 2 -  Partial thickness loss of dermis presenting as a shallow open injury with a red, pink wound bed without slough. (Active)  11/03/21 1556  Location: Coccyx  Location Orientation: Medial  Staging: Stage 2 -  Partial thickness loss of dermis presenting as a shallow open injury with a red, pink wound bed without slough.  Wound Description (Comments):   Present on Admission:     Physical Exam: Vital Signs Blood pressure 107/65, pulse 99, temperature 98.4 F (36.9 C), temperature source Oral, resp. rate 16, height 5\' 1"  (1.549 m), weight 48.6 kg, SpO2 96 %.    General: No acute distress Mood and affect are appropriate Heart: Regular rate and rhythm no rubs murmurs or extra sounds Lungs: Clear to auscultation, breathing unlabored, no rales or wheezes Abdomen: Positive bowel sounds, soft nontender to palpation, nondistended Extremities: No clubbing, cyanosis, or edema   decannulated with gauze over stoma  Ext: no clubbing, cyanosis, or edema Psych: pleasant and cooperative   Skin: stage II coccyx 1cm with fibronecrotic debris in center Neurologic: Cranial nerves II through XII intact, motor strength is 4/5 in bilateral deltoid, bicep, tricep, grip,  trace hip flexor, knee extensors,0/5  ankle dorsiflexor and trace plantar flexor Sensory exam mildly reduced sensation to LT in feet,  proprioception intact--no change  Musculoskeletal: Full range of motion in UE , limited AROM in LE due to weakness  No joint swelling   Assessment/Plan: 1. Functional deficits which require 3+ hours per day of interdisciplinary therapy in a comprehensive inpatient rehab setting. Physiatrist is providing close team supervision and 24 hour management of active medical problems listed below. Physiatrist and rehab team continue to assess barriers to discharge/monitor patient progress toward functional and medical goals  Care Tool:  Bathing    Body parts bathed by patient: Face         Bathing assist Assist Level: Supervision/Verbal cueing     Upper Body Dressing/Undressing Upper body dressing   What is the patient wearing?: Pull over shirt    Upper body assist Assist Level: Minimal Assistance - Patient > 75%    Lower Body Dressing/Undressing Lower body dressing      What is the patient wearing?: Incontinence brief     Lower body assist Assist for lower body dressing: Dependent - Patient 0%     Toileting Toileting    Toileting assist Assist for toileting: Dependent - Patient 0%     Transfers Chair/bed transfer  Transfers assist  Chair/bed transfer activity did not occur: N/A  Chair/bed transfer assist level: Dependent - Patient 0%  Locomotion Ambulation   Ambulation assist   Ambulation activity did not occur: Safety/medical concerns          Walk 10 feet activity   Assist  Walk 10 feet activity did not occur: Safety/medical concerns        Walk 50 feet activity   Assist Walk 50 feet with 2 turns activity did not occur: Safety/medical concerns         Walk 150 feet activity   Assist Walk 150 feet activity did not occur: Safety/medical concerns         Walk 10 feet on uneven surface  activity   Assist Walk 10 feet on uneven surfaces activity did not occur: Safety/medical concerns         Wheelchair     Assist Is  the patient using a wheelchair?: Yes (not tested during eval due to safety) Type of Wheelchair: Manual Wheelchair activity did not occur: Safety/medical concerns         Wheelchair 50 feet with 2 turns activity    Assist    Wheelchair 50 feet with 2 turns activity did not occur: Safety/medical concerns       Wheelchair 150 feet activity     Assist  Wheelchair 150 feet activity did not occur: Safety/medical concerns       Blood pressure 107/65, pulse 99, temperature 98.4 F (36.9 C), temperature source Oral, resp. rate 16, height 5\' 1"  (1.549 m), weight 48.6 kg, SpO2 96 %.  Medical Problem List and Plan: 1. 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  Antithrombotics: -DVT/anticoagulation:  Pharmaceutical: Lovenox             -antiplatelet therapy: N/a 3. Pain:  Managed by Dr.Phillips. Off Methadone and tramadol- --Oxycodone 5 mg TID with tylenol prn for pain.consider transition to tramadol would avoid methadone due to pulmonary depression issues, pharmacodynamics  4. Mood: Team to provide ego support/encouragement. LCSW to follow for evaluation and support.              -antipsychotic agents:  N/A 5. Neuropsych: This patient is capable of making decisions on her own behalf. 6. Stage 2 decubitus ulcer: Routine pressure relief measures.              --continue air mattress overlay for MASD/Stage 2 decub.   6/11 begin medihoney to wound daily 7. Fluids/Electrolytes/Nutrition: Strict I/O. Continue regular diet             --Ensure supplements TID for severe malnutrition.    Latest Ref Rng & Units 11/19/2021    6:41 AM 11/16/2021    6:00 AM 11/15/2021   10:45 AM  BMP  Glucose 70 - 99 mg/dL 01/15/2022  91  710   BUN 8 - 23 mg/dL 7  8  10    Creatinine 0.44 - 1.00  mg/dL 626   9.48   Sodium 135 - 145 mmol/L 129  134  135   Potassium 3.5 - 5.1 mmol/L 4.2  4.2  3.9   Chloride 98 - 111 mmol/L 93  97  95   CO2 22 - 32 mmol/L 27  29  33  Calcium 8.9 - 10.3 mg/dL 8.6  8.5  8.2   Well hydrated  8. COPD w/VDRF/Trach dependent: Continue ATC 5-8 liters --followed by Dr. Lamonte Sakai.  --Keep saturation 88-92% per pulmonary --encourage pulmonary hygiene  --Continue Singulair, Yuperi, Brovana and Pulmicort nebs.  -6/16 Decannulated 9. Leucocytosis: Down vs peak but still elevated, afebrile ,cont to monitor     Latest Ref Rng & Units 11/19/2021    6:41 AM 11/15/2021   10:45 AM 11/14/2021    6:06 AM  CBC  WBC 4.0 - 10.5 K/uL 16.5  14.9  15.4   Hemoglobin 12.0 - 15.0 g/dL 9.9  10.0  9.6   Hematocrit 36.0 - 46.0 % 31.6  32.0  29.8   Platelets 150 - 400 K/uL 426  426  350    No signs of infection, continue to monitor 10. Panic attacks/Anxiety d/o: Klonopin weaned down 05/30 to  0.5 mg TID.6/16 down to .25mg  TID , D/C ativan, resume prn xanax             --Seroquel 75 mg BID. Reduced to 50mg  BID  - appreciate CCM input  11. Cardiomyopathy: Continue Metoprolol 12.5 mg bid and Crestor.              --aldactone added 06/07-->monitor for orthostatic symptoms.  No sign of failure 6/14 12. Ileus/intermittent issues w/N/V: Has been refusing miralax and senna intermittently. Will decrease to Miralax to daily w/Senna S daily             --Keep Mg>2.0 and K>4.0 for adequate supplementation/recurrence.               --hypokalemia--kdur 20 TID and check lytes Monday             --add Mg for supplement.    Multiple BMs after sorbitol 2 d ago but no BM yesterday , still on Reglan , no signs of ileus , may d/c, start sorbitol daily prn, has dulc supp  -Large BM yesterday, continue to follow 13. Pulmonary nodules: PCCM recommends repeat CT 6-8 weeks from 10/28/21 to monitor for resolution.  14. Interstitial cystitis s/p cystectomy and fistula repair: Followed by Dr.Evans.    36.  Dispo discussed need for assistance post d/c , husband reportedly undergoing chemotherapy treatment recently told he has Stage 4 , discuss in conf Wed , pt is anxious about care needs , has other family members who can assist   LOS: 10 days A FACE TO FACE EVALUATION WAS PERFORMED  Jennye Boroughs 11/24/2021, 1:55 PM

## 2021-11-24 NOTE — Progress Notes (Signed)
Physical Therapy Session Note  Patient Details  Name: [name] MRN: [id number] Date of Birth: [birth year]  Today's Date: 11/24/2021 PT Individual [phone]   30mn  Short Term Goals: Week 1:  PT Short Term Goal 1 (Week 1): Pt will complete bed mobility with mod A x 1 PT Short Term Goal 1 - Progress (Week 1): Met PT Short Term Goal 2 (Week 1): Pt will complete least restrictive transfer with assist x 1 PT Short Term Goal 2 - Progress (Week 1): Met PT Short Term Goal 3 (Week 1): Pt will initiate gait training as safe and able PT Short Term Goal 3 - Progress (Week 1): Met PT Short Term Goal 4 (Week 1): Pt will initiate w/c mobility as safe and able PT Short Term Goal 4 - Progress (Week 1): Met PT Short Term Goal 5 - Progress (Week 1): Other (comment) Week 2:  PT Short Term Goal 1 (Week 2): Pt will ambulate 154fwith mod assist and LRAD with +2 for WC follow PT Short Term Goal 2 (Week 2): Pt will propell  WC >15032fith supervision assist PT Short Term Goal 3 (Week 2): Pt will trasnfer [facility] consistently with LRAD PT Short Term Goal 4 (Week 2): Pt will perform 5xSTS   Skilled Therapeutic Interventions/Progress Updates:   Pt received supine in bed and agreeable to PT. Roling r and L with CGA to don pants. Supine>sit transfer with supervision assist, mild HOB elevation and heavy use of rail on the R side. [facility]  [facility]ist and cues for improved use of BLE to push buttock off board. Pt transported to rehab gym. Slide board transfer to mat table with min assist and cues for UE placement and improved use of BLE. Sit<>stand with mod assist and BUE on RW x 2 with 10 sec hold in standing on first bout and reciprocal stepping BLE x 3 on second bout. Noted to have  BUE fatigue limiting increased time in standing. Sit<>stand with min-mod assist in eva walker. Reciprocal stepping x 5 BLE. Gait training with eva walker x [id number]  min-mod assist and +2 for WC follow due to fatigue. Pt performed WC mobility through hallx [id number] supervision assist until fatigue limits further distance. Pt returned to room and performed slide board transfer to bed with mod assist and cues for use of BLE. Sit>supine completed with mod assist at BLE, and left supine in bed with call bell in reach and all needs met.         Therapy Documentation Precautions:  Precautions Precautions: Fall Precaution Comments: trach, B foot drop Restrictions Weight Bearing Restrictions: No  Vital Signs: Therapy Vitals Temp: 97.9 F (36.6 C) Pulse Rate: 95 Resp: 15 BP: 99/66 Patient Position (if appropriate): Lying Oxygen Therapy SpO2: 93 % O2 Device: Room Air Pain: denies    Therapy/Group: Individual Therapy  AustiLorie Phenix/2023, 8:01 AM

## 2021-11-24 NOTE — Progress Notes (Signed)
Occupational Therapy Session Note  Patient Details  Name: [name] MRN: [id number] Date of Birth: 1955-12-06  Today's Date: 11/24/2021 OT Individual Time: 1345-1415 OT Individual Time Calculation (min): 30 min    Short Term Goals: Week 2:  OT Short Term Goal 1 (Week 2): Pt will complete sit to stand with min A of 2 and LRAD in prep for standing ADL. OT Short Term Goal 2 (Week 2): Pt will complete 1/3 toileting tasks with mod A. OT Short Term Goal 3 (Week 2): Pt will complete standing grooming task at sink with max A. OT Short Term Goal 4 (Week 2): Pt will don pants with mod A.  Skilled Therapeutic Interventions/Progress Updates:     Upon OT arrival, pt semi recumbent in bed reporting no pain. Pt requesting to wash her hair this date. OT intervention with a focus on functional transfers and self care. Pt completes supine to sit transfer with Min A using bed rails and completes slide board transfer to w/c with Supervision. Pt sits in w/c to complete oral care with setup assist to manage toothpaste. Pt able to open cinnamon rinse Independently. Pt completes oral care thoroughly. Pt's hair was washed at sink with Total A. Pt completes slide board transfer back to bed with Min A and sit to supine with Mod A for LE. Pt was left in bed with rehab tech present to assist with combing hair. Pt progressing towards stated OT goals but continues to be limited by decreased strength, balance, and activity tolerance. Pt continues to benefit from OT services to achieve highest level of function. All needs met.   Therapy Documentation Precautions:  Precautions Precautions: Fall Precaution Comments: trach, B foot drop Restrictions Weight Bearing Restrictions: No  Therapy/Group: Individual Therapy  Marvetta Gibbons 11/24/2021, 4:36 PM

## 2021-11-25 DIAGNOSIS — E871 Hypo-osmolality and hyponatremia: Secondary | ICD-10-CM

## 2021-11-25 DIAGNOSIS — N390 Urinary tract infection, site not specified: Secondary | ICD-10-CM

## 2021-11-25 LAB — URINALYSIS, ROUTINE W REFLEX MICROSCOPIC
Bilirubin Urine: NEGATIVE
Glucose, UA: NEGATIVE mg/dL
Hgb urine dipstick: NEGATIVE
Ketones, ur: NEGATIVE mg/dL
Nitrite: POSITIVE — AB
Protein, ur: 30 mg/dL — AB
Specific Gravity, Urine: 1.009 (ref 1.005–1.030)
WBC, UA: >50 WBC/hpf — ABNORMAL HIGH (ref 0–5)
pH: 8 (ref 5.0–8.0)

## 2021-11-25 LAB — GLUCOSE, CAPILLARY: Glucose-Capillary: 84 mg/dL (ref 70–99)

## 2021-11-25 MED ORDER — CEPHALEXIN 250 MG PO CAPS
250.0000 mg | ORAL_CAPSULE | Freq: Four times a day (QID) | ORAL | Status: DC
Start: 2021-11-25 — End: 2021-11-27
  Administered 2021-11-25 – 2021-11-27 (×9): 250 mg via ORAL
  Filled 2021-11-25 (×9): qty 1

## 2021-11-25 NOTE — Progress Notes (Signed)
Occupational Therapy Session Note  Patient Details  Name: [name] MRN: [id number] Date of Birth: [birth year]  Today's Date: 11/25/2021 OT Individual Time: 6063-0160 OT Individual Time Calculation (min): 45 min    Short Term Goals: Week 2:  OT Short Term Goal 1 (Week 2): Pt will complete sit to stand with min A of 2 and LRAD in prep for standing ADL. OT Short Term Goal 2 (Week 2): Pt will complete 1/3 toileting tasks with mod A. OT Short Term Goal 3 (Week 2): Pt will complete standing grooming task at sink with max A. OT Short Term Goal 4 (Week 2): Pt will don pants with mod A.  Skilled Therapeutic Interventions/Progress Updates:    Subjective: Pt states she needs to urinate again once sitting up after bed level pericare. No c/o pain.    Objective:  Pt semi reclined in bed. Nurse tech present for pericare due to noting soiled brief.  Direct hand off to OT who completed total assist pericare and clothing mangement with pt rolling left and right with supervision.  Also bathed LB with max assist and applied lotion to bilateral feet.  Supine to sit with supervision.  Sliding board transfer to w/c with min assist primarily to place and hold sliding board still.  Pt brushed teeth, bathed UB and doffed/donned shirt with setup sitting at sinkside.  Sliding board transfer to [facility] with min assist.  Continent of urine measured output.Nurse made aware.  Sliding board transfer with min assist to w/c.  Encourage pt to sit up for a little longer than last time (slightly over an hour) and educated pt on benefits of upright sitting.  Pt agreeable to try. .  Call bell in reach, seat alarm on.     Assessment:  Pt making progress evidenced by completion of toileting and transfer to [facility] at sliding board level which is upgraded from bed level bedpan.  Primary barriers today included episode of incontinence at beginning of session which limited OOB therapeutic measures and also limited endurance and pt slightly self  limiting but responds well to encouragement.   Plan: Pt would benefit from further training on endurance training and BLE strengthening.  Therapy Documentation Precautions:  Precautions Precautions: Fall Precaution Comments: trach, B foot drop Restrictions Weight Bearing Restrictions: No    Therapy/Group: Individual Therapy  Amie Critchley 11/25/2021, 12:25 PM

## 2021-11-25 NOTE — Progress Notes (Signed)
Physical Therapy Session Note  Patient Details  Name: [name] MRN: [id number] Date of Birth: 11/30/1955  Today's Date: 11/25/2021 PT Individual Time: 1300-1340 PT Individual Time Calculation (min): 40 min   Short Term Goals: Week 2:  PT Short Term Goal 1 (Week 2): Pt will ambulate 86ft with mod assist and LRAD with +2 for WC follow PT Short Term Goal 2 (Week 2): Pt will propell  WC >140ft with supervision assist PT Short Term Goal 3 (Week 2): Pt will trasnfer to and from [facility] with mod assist consistently with LRAD PT Short Term Goal 4 (Week 2): Pt will perform 5xSTS  Skilled Therapeutic Interventions/Progress Updates:    Pt received seated in w/c in room, agreeable with encouragement to participate in therapy session. Pt reports ongoing pain in her low back, premedicated prior to start of therapy session. Dependent transport via w/c to/from therapy gym for time and energy conservation. Sit to stand with mod A x 1 up to mod A x 2 to [facility] walker during session. Ambulation x 6 ft, x 10 ft, x 12 ft, x 14 ft with Carley Hammed walker and assist x 2 during session. Pt requires one person to steer Carley Hammed walker and 2nd person to assist with standing balance and guard against knee buckling. Pt exhibits increase in foot drop and foot drag during gait with onset of fatigue. Pt requests to return to bed at end of session. Slide board transfer w/c to bed with mod A. Sit to supine mod A needed for BLE management. Pt left seated in bed with needs in reach at end of session.  Therapy Documentation Precautions:  Precautions Precautions: Fall Precaution Comments: trach, B foot drop Restrictions Weight Bearing Restrictions: No       Therapy/Group: Individual Therapy   Peter Congo, PT, DPT, CSRS 11/25/2021, 2:25 PM

## 2021-11-25 NOTE — Progress Notes (Signed)
PROGRESS NOTE   Subjective/Complaints:  Reports intermittent dysuria. Urine has worse smell than usual.   ROS: Patient denies CP, SOB, abd pain,  Cough, vision changes  Objective:   No results found. No results for input(s): "WBC", "HGB", "HCT", "PLT" in the last 72 hours.  No results for input(s): "NA", "K", "CL", "CO2", "GLUCOSE", "BUN", "CREATININE", "CALCIUM" in the last 72 hours.   Intake/Output Summary (Last 24 hours) at 11/25/2021 1720 Last data filed at 11/25/2021 1312 Gross per 24 hour  Intake 600 ml  Output --  Net 600 ml      Pressure Injury 11/03/21 Coccyx Medial Stage 2 -  Partial thickness loss of dermis presenting as a shallow open injury with a red, pink wound bed without slough. (Active)  11/03/21 1556  Location: Coccyx  Location Orientation: Medial  Staging: Stage 2 -  Partial thickness loss of dermis presenting as a shallow open injury with a red, pink wound bed without slough.  Wound Description (Comments):   Present on Admission:     Physical Exam: Vital Signs Blood pressure (!) 99/57, pulse 92, temperature 97.9 F (36.6 C), temperature source Oral, resp. rate 18, height 5\' 1"  (1.549 m), weight 48.6 kg, SpO2 99 %.    General: No acute distress, in bed Mood and affect are appropriate Heart: Regular rate and rhythm no rubs murmurs or extra sounds Lungs: Clear to auscultation, breathing unlabored, no rales or wheezes Abdomen: Positive bowel sounds, soft nontender to palpation, nondistended Extremities: No clubbing, cyanosis, or edema   decannulated with gauze over stoma  Ext: no clubbing, cyanosis, or edema Psych: pleasant and cooperative   Skin: warm and dry, stage II coccyx 1cm with fibronecrotic debris in center Neurologic: Cranial nerves II through XII intact, motor strength is 4/5 in bilateral deltoid, bicep, tricep, grip,  trace hip flexor, knee extensors,0/5  ankle dorsiflexor and  trace plantar flexor Sensory exam mildly reduced sensation to LT in feet, proprioception intact--no change  Musculoskeletal: Full range of motion in UE , limited AROM in LE due to weakness  No joint swelling   Assessment/Plan: 1. Functional deficits which require 3+ hours per day of interdisciplinary therapy in a comprehensive inpatient rehab setting. Physiatrist is providing close team supervision and 24 hour management of active medical problems listed below. Physiatrist and rehab team continue to assess barriers to discharge/monitor patient progress toward functional and medical goals  Care Tool:  Bathing    Body parts bathed by patient: Face         Bathing assist Assist Level: Supervision/Verbal cueing     Upper Body Dressing/Undressing Upper body dressing   What is the patient wearing?: Pull over shirt    Upper body assist Assist Level: Minimal Assistance - Patient > 75%    Lower Body Dressing/Undressing Lower body dressing      What is the patient wearing?: Incontinence brief     Lower body assist Assist for lower body dressing: Dependent - Patient 0%     Toileting Toileting    Toileting assist Assist for toileting: Dependent - Patient 0%     Transfers Chair/bed transfer  Transfers assist  Chair/bed transfer activity did not occur:  N/A  Chair/bed transfer assist level: Dependent - Patient 0%     Locomotion Ambulation   Ambulation assist   Ambulation activity did not occur: Safety/medical concerns          Walk 10 feet activity   Assist  Walk 10 feet activity did not occur: Safety/medical concerns        Walk 50 feet activity   Assist Walk 50 feet with 2 turns activity did not occur: Safety/medical concerns         Walk 150 feet activity   Assist Walk 150 feet activity did not occur: Safety/medical concerns         Walk 10 feet on uneven surface  activity   Assist Walk 10 feet on uneven surfaces activity did not  occur: Safety/medical concerns         Wheelchair     Assist Is the patient using a wheelchair?: Yes (not tested during eval due to safety) Type of Wheelchair: Manual Wheelchair activity did not occur: Safety/medical concerns         Wheelchair 50 feet with 2 turns activity    Assist    Wheelchair 50 feet with 2 turns activity did not occur: Safety/medical concerns       Wheelchair 150 feet activity     Assist  Wheelchair 150 feet activity did not occur: Safety/medical concerns       Blood pressure (!) 99/57, pulse 92, temperature 97.9 F (36.6 C), temperature source Oral, resp. rate 18, height 5\' 1"  (1.549 m), weight 48.6 kg, SpO2 99 %.  Medical Problem List and Plan: 1. 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  Antithrombotics: -DVT/anticoagulation:  Pharmaceutical: Lovenox             -antiplatelet therapy: N/a 3. Pain:  Managed by Dr.Phillips. Off Methadone and tramadol- --Oxycodone 5 mg TID with tylenol prn for pain.consider transition to tramadol would avoid methadone due to pulmonary depression issues, pharmacodynamics  4. Mood: Team to provide ego support/encouragement. LCSW to follow for evaluation and support.              -antipsychotic agents:  N/A 5. Neuropsych: This patient is capable of making decisions on her own behalf. 6. Stage 2 decubitus ulcer: Routine pressure relief measures.              --continue air mattress overlay for MASD/Stage 2 decub.   6/11 begin medihoney to wound daily 7. Fluids/Electrolytes/Nutrition: Strict I/O. Continue regular diet             --Ensure supplements TID for severe malnutrition.    Latest Ref Rng & Units 11/19/2021    6:41 AM 11/16/2021    6:00 AM 11/15/2021   10:45 AM  BMP  Glucose 70 -  99 mg/dL 01/15/2022  91  161   BUN 8 - 23 mg/dL 7  8  10    Creatinine 0.44 - 1.00 mg/dL 096   0.45   Sodium 135 - 145 mmol/L 129  134  135   Potassium 3.5 - 5.1 mmol/L 4.2  4.2  3.9   Chloride 98 - 111 mmol/L 93  97  95   CO2 22 - 32 mmol/L 27  29  33   Calcium 8.9 - 10.3 mg/dL 8.6  8.5  8.2   Well hydrated Repeat labs tomorrow  8. COPD w/VDRF/Trach dependent: Continue ATC 5-8 liters --followed by Dr. Lamonte Sakai.  --Keep saturation 88-92% per pulmonary --encourage pulmonary hygiene  --Continue Singulair, Yuperi, Brovana and Pulmicort nebs.  -6/16 Decannulated 9. Leucocytosis: Down vs peak but still elevated, afebrile ,cont to monitor     Latest Ref Rng & Units 11/19/2021    6:41 AM 11/15/2021   10:45 AM 11/14/2021    6:06 AM  CBC  WBC 4.0 - 10.5 K/uL 16.5  14.9  15.4   Hemoglobin 12.0 - 15.0 g/dL 9.9  10.0  9.6   Hematocrit 36.0 - 46.0 % 31.6  32.0  29.8   Platelets 150 - 400 K/uL 426  426  350    No signs of infection, continue to monitor Repeat CBC tomorrow 10. Panic attacks/Anxiety d/o: Klonopin weaned down 05/30 to  0.5 mg TID.6/16 down to .25mg  TID , D/C ativan, resume prn xanax             --Seroquel 75 mg BID. Reduced to 50mg  BID  - appreciate CCM input  11. Cardiomyopathy: Continue Metoprolol 12.5 mg bid and Crestor.              --aldactone added 06/07-->monitor for orthostatic symptoms.  No sign of failure 6/14 12. Ileus/intermittent issues w/N/V: Has been refusing miralax and senna intermittently. Will decrease to Miralax to daily w/Senna S daily             --Keep Mg>2.0 and K>4.0 for adequate supplementation/recurrence.               --hypokalemia--kdur 20 TID and check lytes Monday             --add Mg for supplement.    Multiple BMs after sorbitol 2 d ago but no BM yesterday , still on Reglan , no signs of ileus , may d/c, start sorbitol daily prn, has dulc supp  -Large BM yesterday, continue to follow 13. Pulmonary nodules: PCCM recommends repeat CT 6-8 weeks from  10/28/21 to monitor for resolution.  14. Interstitial cystitis s/p cystectomy and fistula repair: Followed by Dr.Evans.   76.  Dispo discussed need for assistance post d/c , husband reportedly undergoing chemotherapy treatment recently told he has Stage 4 , discuss in conf Wed , pt is anxious about care needs , has other family members who can assist  60. UTI  -U/A ordered, >50 wbc, bacterial, nitrates  -Keflex 250mg  q6h ordered for 5 days 17. Hyponatremia, appears to be chronic  -Recheck labs tomorrow AM LOS: 11 days A FACE TO FACE EVALUATION WAS PERFORMED  Jennye Boroughs 11/25/2021, 5:20 PM

## 2021-11-26 LAB — BASIC METABOLIC PANEL
Anion gap: 12 (ref 5–15)
BUN: 11 mg/dL (ref 8–23)
CO2: 24 mmol/L (ref 22–32)
Calcium: 8.5 mg/dL — ABNORMAL LOW (ref 8.9–10.3)
Chloride: 94 mmol/L — ABNORMAL LOW (ref 98–111)
Creatinine, Ser: 0.44 mg/dL (ref 0.44–1.00)
GFR, Estimated: >60 mL/min (ref >60)
Glucose, Bld: 78 mg/dL (ref 70–99)
Potassium: 4.3 mmol/L (ref 3.5–5.1)
Sodium: 130 mmol/L — ABNORMAL LOW (ref 135–145)

## 2021-11-26 LAB — CBC
HCT: 31.8 % — ABNORMAL LOW (ref 36.0–46.0)
Hemoglobin: 10.4 g/dL — ABNORMAL LOW (ref 12.0–15.0)
MCH: 30.6 pg (ref 26.0–34.0)
MCHC: 32.7 g/dL (ref 30.0–36.0)
MCV: 93.5 fL (ref 80.0–100.0)
Platelets: 480 10*3/uL — ABNORMAL HIGH (ref 150–400)
RBC: 3.4 MIL/uL — ABNORMAL LOW (ref 3.87–5.11)
RDW: 15.8 % — ABNORMAL HIGH (ref 11.5–15.5)
WBC: 15.2 10*3/uL — ABNORMAL HIGH (ref 4.0–10.5)
nRBC: 0 % (ref 0.0–0.2)

## 2021-11-26 MED ORDER — POTASSIUM CHLORIDE CRYS ER 10 MEQ PO TBCR
10.0000 meq | EXTENDED_RELEASE_TABLET | Freq: Three times a day (TID) | ORAL | Status: DC
Start: 2021-11-26 — End: 2021-11-26

## 2021-11-26 MED ORDER — ENSURE ENLIVE PO LIQD
237.0000 mL | ORAL | Status: DC
  Administered 2021-11-28 – 2021-12-05 (×5): 237 mL via ORAL
  Filled 2021-11-26: qty 237

## 2021-11-26 MED ORDER — POTASSIUM CHLORIDE 20 MEQ PO PACK
20.0000 meq | PACK | Freq: Two times a day (BID) | ORAL | Status: DC
  Administered 2021-11-26 – 2021-11-28 (×4): 20 meq via ORAL
  Filled 2021-11-26 (×4): qty 1

## 2021-11-26 NOTE — Progress Notes (Signed)
Physical Therapy Session Note  Patient Details  Name: [name] MRN: [id number] Date of Birth: December 17, 1955  Today's Date: 11/26/2021 PT Individual Time: 0901-1000 and 1103-1200 PT Individual Time Calculation (min): 59 min and 57 min  Short Term Goals: Week 1:  PT Short Term Goal 1 (Week 1): Pt will complete bed mobility with mod A x 1 PT Short Term Goal 1 - Progress (Week 1): Met PT Short Term Goal 2 (Week 1): Pt will complete least restrictive transfer with assist x 1 PT Short Term Goal 2 - Progress (Week 1): Met PT Short Term Goal 3 (Week 1): Pt will initiate gait training as safe and able PT Short Term Goal 3 - Progress (Week 1): Met PT Short Term Goal 4 (Week 1): Pt will initiate w/c mobility as safe and able PT Short Term Goal 4 - Progress (Week 1): Met PT Short Term Goal 5 - Progress (Week 1): Other (comment) Week 2:  PT Short Term Goal 1 (Week 2): Pt will ambulate 27f with mod assist and LRAD with +2 for WC follow PT Short Term Goal 2 (Week 2): Pt will propell  WC >1521fwith supervision assist PT Short Term Goal 3 (Week 2): Pt will trasnfer to and [facility] assist consistently with LRAD PT Short Term Goal 4 (Week 2): Pt will perform 5xSTS  Skilled Therapeutic Interventions/Progress Updates:  Session 1: Patient supine in bed on entrance to room. Patient alert and agreeable to PT session.   Patient with no pain complaint throughout session.  Therapeutic Activity: Bed Mobility: Patient performed supine <> sit with ***. VC/ tc required for ***. Transfers: Patient performed sit<>stand and stand pivot transfers throughout session with ***. Provided verbal cues for***.  Gait Training:  Patient ambulated *** ft using *** with ***. Demonstrated ***. Provided vc/ tc for ***.  Wheelchair Mobility:  Patient propelled wheelchair *** feet with ***. Provided vc for ***.  Neuromuscular Re-ed: NMR facilitated during session with focus on***. Pt guided in ***. NMR performed  for improvements in motor control and coordination, balance, sequencing, judgement, and self confidence/ efficacy in performing all aspects of mobility at highest level of independence.   Therapeutic Exercise: Patient performed the following exercises with vc/ tc for proper technique. ***  Patient ***  in *** at end of session with brakes locked, *** alarm set, and all needs within reach.  Session 2: Patient *** in *** on entrance to room. Patient alert and agreeable to PT session.   Patient with no pain complaint throughout session.  Therapeutic Activity: Bed Mobility: Patient performed supine <> sit with ***. VC/ tc required for ***. Transfers: Patient performed sit<>stand and stand pivot transfers throughout session with ***. Provided verbal cues for***.  Wheelchair Mobility:  Patient propelled wheelchair *** feet with ***. Provided vc for ***.  Therapeutic Exercise: Patient performed the following exercises with vc/ tc for proper technique. ***  Patient ***  in *** at end of session with brakes locked, *** alarm set, and all needs within reach.   Therapy Documentation Precautions:  Precautions Precautions: Fall Precaution Comments: trach, B foot drop Restrictions Weight Bearing Restrictions: No General:   Vital Signs: Oxygen Therapy SpO2: 97 % O2 Device: Room Air Pain:   Mobility:   Locomotion :    Trunk/Postural Assessment :    Balance:   Exercises:   Other Treatments:      Therapy/Group: Individual Therapy  JuAlger SimonsT, DPT, CSRS 11/26/2021, 10:25 AM

## 2021-11-26 NOTE — Progress Notes (Signed)
Occupational Therapy Session Note  Patient Details  Name: [name] MRN: [id number] Date of Birth: [birth year]  Today's Date: 11/26/2021 OT Individual Time: 1445-1530 OT Individual Time Calculation (min): 45 min    Short Term Goals: Week 2:  OT Short Term Goal 1 (Week 2): Pt will complete sit to stand with min A of 2 and LRAD in prep for standing ADL. OT Short Term Goal 2 (Week 2): Pt will complete 1/3 toileting tasks with mod A. OT Short Term Goal 3 (Week 2): Pt will complete standing grooming task at sink with max A. OT Short Term Goal 4 (Week 2): Pt will don pants with mod A.  Skilled Therapeutic Interventions/Progress Updates:    S: Patient agreeable to participate in OT session. Reports 0/10 pain level.    Patient participated in skilled OT session focusing on seated balance/core strength, activity tolerance, and BUE strengthening while seated on EOB. Therapist facilitated strengthening and activity tolerance activities  such as Bilateral bicep curls, scapular row, scapular retraction, shoulder flexion, punches using yellow band while braiding patient's hair (pt's request) in order to improve patient's overall activity tolerance and functional performance during ADL tasks while requiring less physical assist and less fatigue noted. Pt provided with Min assist for set-up and positioning with band and VC for form and technique. At end of session, patient transitioned back to bed and assisted with bed positioning/boosting using bilateral arm railings.     P: Continue to work on increasing participation in basic ADL tasks. Use standing frame during functional tasks.    Therapy Documentation Precautions:  Precautions Precautions: Fall Precaution Comments: trach, B foot drop Restrictions Weight Bearing Restrictions: No   Therapy/Group: Individual Therapy  Limmie Patricia, OTR/L,CBIS  Supplemental OT - [facility]  11/26/2021, 1:00 PM

## 2021-11-26 NOTE — Progress Notes (Signed)
Speech Language Pathology Daily Session Note  Patient Details  Name: [name] MRN: [id number] Date of Birth: 17-Jan-1956  Today's Date: 11/26/2021 SLP Individual Time: 5366-4403 SLP Individual Time Calculation (min): 43 min  Short Term Goals: Week 2: SLP Short Term Goal 1 (Week 2): Patient will demonstrate complex problem solving for functional tasks with supervision level verbal cues. SLP Short Term Goal 2 (Week 2): Patient will recal new, daily information with mod I for use of compensatory strategies  Skilled Therapeutic Interventions: Skilled ST treatment focused on cognitive goals. Pt continues to exhibit mild air escape from stoma with speech. This did not appear to negatively impact vocal intensity or intelligibility at the conversation level. Pt requested for gauze to be re-applied covering stoma as it appears to have shifted throughout the day. SLP replaced gauze to ensure covering stoma.    SLP facilitated session by providing overall supervision A verbal cues fading to mod I for identifying intentional organization errors in daily BID pillbox, mod I for generating appropriate solutions, and mod I for problem solving hypothetical scenarios pertaining to medication management. Pt reports managing limited medications at prior level and is interested in utilizing a pillbox at discharge d/t increased number of medications currently taking. May address with upcoming sessions pending pt interest. Patient was left in bed with alarm activated and immediate needs within reach at end of session. Continue per current plan of care.      Pain  None/denied  Therapy/Group: Individual Therapy  Tamala Ser 11/26/2021, 1:26 PM

## 2021-11-26 NOTE — Progress Notes (Signed)
PROGRESS NOTE   Subjective/Complaints:  Did well with decannulation , asking about KCL tab, difficult to swallow   ROS: Patient denies CP, SOB, abd pain,  Cough, vision changes  Objective:   No results found. Recent Labs    11/26/21 0616  WBC 15.2*  HGB 10.4*  HCT 31.8*  PLT 480*    Recent Labs    11/26/21 0616  NA 130*  K 4.3  CL 94*  CO2 24  GLUCOSE 78  BUN 11  CREATININE 0.44  CALCIUM 8.5*     Intake/Output Summary (Last 24 hours) at 11/26/2021 0807 Last data filed at 11/26/2021 0725 Gross per 24 hour  Intake 956 ml  Output --  Net 956 ml      Pressure Injury 11/03/21 Coccyx Medial Stage 2 -  Partial thickness loss of dermis presenting as a shallow open injury with a red, pink wound bed without slough. (Active)  11/03/21 1556  Location: Coccyx  Location Orientation: Medial  Staging: Stage 2 -  Partial thickness loss of dermis presenting as a shallow open injury with a red, pink wound bed without slough.  Wound Description (Comments):   Present on Admission:     Physical Exam: Vital Signs Blood pressure (!) 91/55, pulse 82, temperature 97.8 F (36.6 C), resp. rate 16, height 5\' 1"  (1.549 m), weight 48.6 kg, SpO2 97 %.    General: No acute distress, in bed Mood and affect are appropriate Heart: Regular rate and rhythm no rubs murmurs or extra sounds Lungs: Clear to auscultation, breathing unlabored, no rales or wheezes Abdomen: Positive bowel sounds, soft nontender to palpation, nondistended Extremities: No clubbing, cyanosis, or edema   decannulated with gauze over stoma  Ext: no clubbing, cyanosis, or edema Psych: pleasant and cooperative   Skin: warm and dry, stage II coccyx 1cm with fibronecrotic debris in center Neurologic: Cranial nerves II through XII intact, motor strength is 4/5 in bilateral deltoid, bicep, tricep, grip,  trace hip flexor, knee extensors,0/5  ankle dorsiflexor and  trace plantar flexor Sensory exam mildly reduced sensation to LT in feet, proprioception intact--no change  Musculoskeletal: Full range of motion in UE , limited AROM in LE due to weakness  No joint swelling   Assessment/Plan: 1. Functional deficits which require 3+ hours per day of interdisciplinary therapy in a comprehensive inpatient rehab setting. Physiatrist is providing close team supervision and 24 hour management of active medical problems listed below. Physiatrist and rehab team continue to assess barriers to discharge/monitor patient progress toward functional and medical goals  Care Tool:  Bathing    Body parts bathed by patient: Face         Bathing assist Assist Level: Supervision/Verbal cueing     Upper Body Dressing/Undressing Upper body dressing   What is the patient wearing?: Pull over shirt    Upper body assist Assist Level: Minimal Assistance - Patient > 75%    Lower Body Dressing/Undressing Lower body dressing      What is the patient wearing?: Incontinence brief     Lower body assist Assist for lower body dressing: Dependent - Patient 0%     Toileting Toileting    Toileting assist  Assist for toileting: Dependent - Patient 0%     Transfers Chair/bed transfer  Transfers assist  Chair/bed transfer activity did not occur: N/A  Chair/bed transfer assist level: Dependent - Patient 0%     Locomotion Ambulation   Ambulation assist   Ambulation activity did not occur: Safety/medical concerns          Walk 10 feet activity   Assist  Walk 10 feet activity did not occur: Safety/medical concerns        Walk 50 feet activity   Assist Walk 50 feet with 2 turns activity did not occur: Safety/medical concerns         Walk 150 feet activity   Assist Walk 150 feet activity did not occur: Safety/medical concerns         Walk 10 feet on uneven surface  activity   Assist Walk 10 feet on uneven surfaces activity did not  occur: Safety/medical concerns         Wheelchair     Assist Is the patient using a wheelchair?: Yes (not tested during eval due to safety) Type of Wheelchair: Manual Wheelchair activity did not occur: Safety/medical concerns         Wheelchair 50 feet with 2 turns activity    Assist    Wheelchair 50 feet with 2 turns activity did not occur: Safety/medical concerns       Wheelchair 150 feet activity     Assist  Wheelchair 150 feet activity did not occur: Safety/medical concerns       Blood pressure (!) 91/55, pulse 82, temperature 97.8 F (36.6 C), resp. rate 16, height 5\' 1"  (1.549 m), weight 48.6 kg, SpO2 97 %.  Medical Problem List and Plan: 1. 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  Antithrombotics: -DVT/anticoagulation:  Pharmaceutical: Lovenox             -antiplatelet therapy: N/a 3. Pain:  Managed by Dr.Phillips. Off Methadone and tramadol- --Oxycodone 5 mg TID with tylenol prn for pain.consider transition to tramadol would avoid methadone due to pulmonary depression issues, pharmacodynamics  4. Mood: Team to provide ego support/encouragement. LCSW to follow for evaluation and support.              -antipsychotic agents:  N/A 5. Neuropsych: This patient is capable of making decisions on her own behalf. 6. Stage 2 decubitus ulcer: Routine pressure relief measures.              --continue air mattress overlay for MASD/Stage 2 decub.   6/11 begin medihoney to wound daily 7. Fluids/Electrolytes/Nutrition: Strict I/O. Continue regular diet             --Ensure supplements TID for severe malnutrition.    Latest Ref Rng & Units 11/26/2021    6:16 AM 11/19/2021    6:41 AM 11/16/2021    6:00 AM  BMP  Glucose 70 - 99 mg/dL 78  100  91    BUN 8 - 23 mg/dL 11  7  8    Creatinine 0.44 - 1.00 mg/dL 0.44  0.38  <0.30   Sodium 135 - 145 mmol/L 130  129  134   Potassium 3.5 - 5.1 mmol/L 4.3  4.2  4.2   Chloride 98 - 111 mmol/L 94  93  97   CO2 22 - 32 mmol/L 24  27  29    Calcium 8.9 - 10.3 mg/dL 8.5  8.6  8.5   Well hydrated Repeat labs tomorrow  8. COPD w/VDRF/Trach dependent: Continue ATC 5-8 liters --followed by Dr. Lamonte Sakai.  --Keep saturation 88-92% per pulmonary --encourage pulmonary hygiene  --Continue Singulair, Yuperi, Brovana and Pulmicort nebs.  -6/16 Decannulated 9. Leucocytosis: Down vs peak but still elevated, afebrile ,cont to monitor     Latest Ref Rng & Units 11/26/2021    6:16 AM 11/19/2021    6:41 AM 11/15/2021   10:45 AM  CBC  WBC 4.0 - 10.5 K/uL 15.2  16.5  14.9   Hemoglobin 12.0 - 15.0 g/dL 10.4  9.9  10.0   Hematocrit 36.0 - 46.0 % 31.8  31.6  32.0   Platelets 150 - 400 K/uL 480  426  426   Trending down  10. Panic attacks/Anxiety d/o: Klonopin weaned down 05/30 to  0.5 mg TID.6/16 down to .25mg  TID , D/C ativan, resume prn xanax             --Seroquel 75 mg BID. Reduced to 50mg  BID  - appreciate CCM input  11. Cardiomyopathy: Continue Metoprolol 12.5 mg bid and Crestor.              --aldactone added 06/07-->monitor for orthostatic symptoms.  No sign of failure 6/14 12. Ileus/intermittent issues w/N/V: Has been refusing miralax and senna intermittently. Will decrease to Miralax to daily w/Senna S daily             --Keep Mg>2.0 and K>4.0 for adequate supplementation/recurrence.               --hypokalemia--kdur 20 TID and check lytes Monday             --add Mg for supplement.    Multiple BMs after sorbitol 2 d ago but no BM yesterday , still on Reglan , no signs of ileus , may d/c, start sorbitol daily prn, has dulc supp  -Large BM yesterday, continue to follow 13. Pulmonary nodules: PCCM recommends repeat CT 6-8 weeks from 10/28/21 to monitor for resolution.  14. Interstitial cystitis s/p  cystectomy and fistula repair: Followed by Dr.Evans.   31.  Dispo discussed need for assistance post d/c , husband reportedly undergoing chemotherapy treatment recently told he has Stage 4 , discuss in conf Wed , pt is anxious about care needs , has other family members who can assist  36. UTI  -U/A ordered, >50 wbc, bacterial, nitrates  -Keflex 250mg  q6h ordered for 5 days, await culture  17. Hyponatremia, appears to be chronic, may be related to spironolactone       Latest Ref Rng & Units 11/26/2021    6:16 AM 11/19/2021    6:41 AM 11/16/2021    6:00 AM  BMP  Glucose 70 - 99 mg/dL 78  100  91   BUN 8 - 23 mg/dL 11  7  8    Creatinine 0.44 - 1.00 mg/dL 0.44  0.38  <0.30   Sodium 135 - 145 mmol/L 130  129  134   Potassium 3.5 - 5.1 mmol/L 4.3  4.2  4.2   Chloride 98 - 111 mmol/L 94  93  97   CO2 22 - 32 mmol/L 24  27  29    Calcium  8.9 - 10.3 mg/dL 8.5  8.6  8.5     LOS: 12 days A FACE TO FACE EVALUATION WAS PERFORMED  Erick Colace 11/26/2021, 8:07 AM

## 2021-11-26 NOTE — Progress Notes (Signed)
Nutrition Follow-up  DOCUMENTATION CODES:   Severe malnutrition in context of chronic illness  INTERVENTION:   Decrease Ensure Enlive po Daily, each supplement provides 350 kcal and 20 grams of protein. Magic cup BID with meals, each supplement provides 290 kcal and 9 grams of protein Encourage good PO intake  Continue Multivitamin w/ minerals daily  NUTRITION DIAGNOSIS:   Severe Malnutrition related to chronic illness as evidenced by severe muscle depletion, severe fat depletion. - Ongoing   GOAL:   Patient will meet greater than or equal to 90% of their needs - Ongoing  MONITOR:   PO intake, Supplement acceptance, Labs, Weight trends  REASON FOR ASSESSMENT:   Consult Assessment of nutrition requirement/status  ASSESSMENT:   66 y.o. female admitted to CIR after a long hospital admission due to acute respiratory failure and hypercapnia, requiring intubation and a trach. During admission, pt developed a SBO vs ileus, treated with liquids diet. PMH includes GERD, COPD, and severe anxiety.  6/07 - admitted to CIR; Regular diet 6/09 - NPO; advanced to CLD 6/10 - diet advanced to full liquids 6/11 - diet advanced to regular diet 6/16 - decannulated  Pt reports that her PO intake varies on the food. States that she does not like a lot of food that is offered. Encouraged pt to ask family to bring in snacks and foods that she likes. Pt reports that she is only drinking 1 Ensure per day, states that it needs to be ice cold/frozen to drink  Per EMR, pt PO intake includes: 6/16: Breakfast 25%, Lunch 25% 6/17: Breakfast 40%, Lunch 50% 6/18: Breakfast 25%, Lunch 65%, Dinner 75% 6/19: Breakfast 100%  Discussed adding additional nutritional supplements with pt, pt willing to try.  If pt PO intake continues to be poor, may need to consider additional nutrition support.   Medications reviewed and include: Cephalexin, Magnesium Oxide, Reglan, MVI, Protonix, Potassium Chloride,  Senokot, Spironolactone Labs reviewed: Sodium 130  Diet Order:   Diet Order             Diet regular Room service appropriate? Yes; Fluid consistency: Thin  Diet effective now                   EDUCATION NEEDS:   No education needs have been identified at this time  Skin:  Skin Integrity Issues:: Stage II, Other (Comment) Stage II: Coccyx Other: MASD - Bilateral Buttocks  Last BM:  6/18  Height:   Ht Readings from Last 1 Encounters:  11/16/21 5\' 1"  (1.549 m)    Weight:   Wt Readings from Last 1 Encounters:  11/23/21 48.6 kg    Ideal Body Weight:  47.7 kg  BMI:  Body mass index is 20.24 kg/m.  Estimated Nutritional Needs:   Kcal:  1700-1900  Protein:  90-110 grams  Fluid:  >/= 1.7 L    11/25/21 RD, LDN Clinical Dietitian See [facility] for contact information.

## 2021-11-27 ENCOUNTER — Other Ambulatory Visit: Payer: Self-pay

## 2021-11-27 ENCOUNTER — Encounter (HOSPITAL_COMMUNITY): Payer: Medicare Other

## 2021-11-27 DIAGNOSIS — Z122 Encounter for screening for malignant neoplasm of respiratory organs: Secondary | ICD-10-CM

## 2021-11-27 DIAGNOSIS — Z87891 Personal history of nicotine dependence: Secondary | ICD-10-CM

## 2021-11-27 LAB — URINE CULTURE: Culture: >=100000 — AB

## 2021-11-27 MED ORDER — CEPHALEXIN 250 MG PO CAPS
250.0000 mg | ORAL_CAPSULE | Freq: Three times a day (TID) | ORAL | Status: AC
  Administered 2021-11-27 – 2021-12-02 (×20): 250 mg via ORAL
  Filled 2021-11-27 (×20): qty 1

## 2021-11-27 NOTE — Progress Notes (Signed)
Speech Language Pathology Daily Session Note  Patient Details  Name: [name] MRN: [id number] Date of Birth: 02/20/1956  Today's Date: 11/27/2021 SLP Individual Time: 1045-1130 SLP Individual Time Calculation (min): 45 min  Short Term Goals: Week 2: SLP Short Term Goal 1 (Week 2): Patient will demonstrate complex problem solving for functional tasks with supervision level verbal cues. SLP Short Term Goal 2 (Week 2): Patient will recal new, daily information with mod I for use of compensatory strategies  Skilled Therapeutic Interventions: Skilled ST treatment focused on cognitive goals. Pt was accompanied by sister this date who asked SLP several questions pertaining to discharge re: medication management, equipment set-up, quality of life factors, memory/cognitive considerations. SLP discussed medication management considerations using TID vs. QID pillbox as memory and organizational strategy at discharge. Pt and sister in agreement. SLP demonstrated use using different types of pillboxes. Pt engaged in anticipatory problem solving pertaining medication scenarios and home environment set-up with mod I-to-sup A verbal cues. Sister reports pt will have good family support at discharge through her, spouse, and other family members who plan to rotate assistance. Patient was left in bed with alarm activated and immediate needs within reach at end of session. Continue per current plan of care.      Pain Pain Assessment Pain Scale: 0-10 Pain Score: 0-No pain  Therapy/Group: Individual Therapy  Tamala Ser 11/27/2021, 12:51 PM

## 2021-11-27 NOTE — Progress Notes (Signed)
PROGRESS NOTE   Subjective/Complaints:  Good day in therapy stood more, has taken steps with EVA walker , having BMs daily No significant pain   ROS: Patient denies CP, SOB, abd pain,  Cough, vision changes  Objective:   No results found. Recent Labs    11/26/21 0616  WBC 15.2*  HGB 10.4*  HCT 31.8*  PLT 480*    Recent Labs    11/26/21 0616  NA 130*  K 4.3  CL 94*  CO2 24  GLUCOSE 78  BUN 11  CREATININE 0.44  CALCIUM 8.5*     Intake/Output Summary (Last 24 hours) at 11/27/2021 8546 Last data filed at 11/27/2021 0725 Gross per 24 hour  Intake 354 ml  Output --  Net 354 ml      Pressure Injury 11/03/21 Coccyx Medial Stage 2 -  Partial thickness loss of dermis presenting as a shallow open injury with a red, pink wound bed without slough. (Active)  11/03/21 1556  Location: Coccyx  Location Orientation: Medial  Staging: Stage 2 -  Partial thickness loss of dermis presenting as a shallow open injury with a red, pink wound bed without slough.  Wound Description (Comments):   Present on Admission:     Physical Exam: Vital Signs Blood pressure 101/68, pulse 95, temperature 97.9 F (36.6 C), resp. rate 16, height 5\' 1"  (1.549 m), weight 48.6 kg, SpO2 94 %.    General: No acute distress, in bed Mood and affect are appropriate Heart: Regular rate and rhythm no rubs murmurs or extra sounds Lungs: Clear to auscultation, breathing unlabored, no rales or wheezes Abdomen: Positive bowel sounds, soft nontender to palpation, nondistended Extremities: No clubbing, cyanosis, or edema   decannulated with gauze over stoma  Ext: no clubbing, cyanosis, or edema Psych: pleasant and cooperative   Skin: warm and dry, stage II coccyx 1cm with fibronecrotic debris in center Neurologic: Cranial nerves II through XII intact, motor strength is 4/5 in bilateral deltoid, bicep, tricep, grip,  trace hip flexor, knee  extensors,0/5  ankle dorsiflexor and trace plantar flexor Sensory exam mildly reduced sensation to LT in feet, proprioception intact--no change  Musculoskeletal: Full range of motion in UE , limited AROM in LE due to weakness  No joint swelling   Assessment/Plan: 1. Functional deficits which require 3+ hours per day of interdisciplinary therapy in a comprehensive inpatient rehab setting. Physiatrist is providing close team supervision and 24 hour management of active medical problems listed below. Physiatrist and rehab team continue to assess barriers to discharge/monitor patient progress toward functional and medical goals  Care Tool:  Bathing    Body parts bathed by patient: Face         Bathing assist Assist Level: Supervision/Verbal cueing     Upper Body Dressing/Undressing Upper body dressing   What is the patient wearing?: Pull over shirt    Upper body assist Assist Level: Minimal Assistance - Patient > 75%    Lower Body Dressing/Undressing Lower body dressing      What is the patient wearing?: Incontinence brief     Lower body assist Assist for lower body dressing: Dependent - Patient 0%     Toileting  Toileting    Toileting assist Assist for toileting: Dependent - Patient 0%     Transfers Chair/bed transfer  Transfers assist  Chair/bed transfer activity did not occur: N/A  Chair/bed transfer assist level: Moderate Assistance - Patient 50 - 74%     Locomotion Ambulation   Ambulation assist   Ambulation activity did not occur: Safety/medical concerns  Assist level: Maximal Assistance - Patient 25 - 49% Assistive device: Ethelene Hal Max distance: 12 ft   Walk 10 feet activity   Assist  Walk 10 feet activity did not occur: Safety/medical concerns        Walk 50 feet activity   Assist Walk 50 feet with 2 turns activity did not occur: Safety/medical concerns         Walk 150 feet activity   Assist Walk 150 feet activity did not  occur: Safety/medical concerns         Walk 10 feet on uneven surface  activity   Assist Walk 10 feet on uneven surfaces activity did not occur: Safety/medical concerns         Wheelchair     Assist Is the patient using a wheelchair?: Yes Type of Wheelchair: Manual Wheelchair activity did not occur: Safety/medical concerns  Wheelchair assist level: Supervision/Verbal cueing Max wheelchair distance: 150 ft    Wheelchair 50 feet with 2 turns activity    Assist    Wheelchair 50 feet with 2 turns activity did not occur: Safety/medical concerns   Assist Level: Contact Guard/Touching assist   Wheelchair 150 feet activity     Assist  Wheelchair 150 feet activity did not occur: Safety/medical concerns   Assist Level: Supervision/Verbal cueing   Blood pressure 101/68, pulse 95, temperature 97.9 F (36.6 C), resp. rate 16, height 5\' 1"  (1.549 m), weight 48.6 kg, SpO2 94 %.  Medical Problem List and Plan: 1. 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  Antithrombotics: -DVT/anticoagulation:  Pharmaceutical: Lovenox             -antiplatelet therapy: N/a 3. Pain:  Managed by Dr.Phillips. Off Methadone and tramadol- --Oxycodone 5 mg TID with tylenol prn for pain.consider transition to tramadol would avoid methadone due to pulmonary depression issues, pharmacodynamics  4. Mood: Team to provide ego support/encouragement. LCSW to follow for evaluation and support.              -antipsychotic agents:  N/A 5. Neuropsych: This patient is capable of making decisions on her own behalf. 6. Stage 2 decubitus ulcer: Routine pressure relief measures.              --continue air mattress overlay for MASD/Stage 2 decub.   6/11 begin medihoney to  wound daily 7. Fluids/Electrolytes/Nutrition: Strict I/O. Continue regular diet             --Ensure supplements TID for severe malnutrition.    Latest Ref Rng & Units 11/26/2021    6:16 AM 11/19/2021    6:41 AM 11/16/2021    6:00 AM  BMP  Glucose 70 - 99 mg/dL 78  100  91   BUN 8 - 23 mg/dL 11  7  8   Creatinine 0.44 - 1.00 mg/dL 0.44  0.38  <0.30   Sodium 135 - 145 mmol/L 130  129  134   Potassium 3.5 - 5.1 mmol/L 4.3  4.2  4.2   Chloride 98 - 111 mmol/L 94  93  97   CO2 22 - 32 mmol/L 24  27  29    Calcium 8.9 - 10.3 mg/dL 8.5  8.6  8.5   Well hydrated Repeat labs tomorrow  8. COPD w/VDRF/Trach dependent: Continue ATC 5-8 liters --followed by Dr. Lamonte Sakai.  --Keep saturation 88-92% per pulmonary --encourage pulmonary hygiene  --Continue Singulair, Yuperi, Brovana and Pulmicort nebs.  -6/16 Decannulated 9. Leucocytosis: Down vs peak but still elevated, afebrile ,cont to monitor     Latest Ref Rng & Units 11/26/2021    6:16 AM 11/19/2021    6:41 AM 11/15/2021   10:45 AM  CBC  WBC 4.0 - 10.5 K/uL 15.2  16.5  14.9   Hemoglobin 12.0 - 15.0 g/dL 10.4  9.9  10.0   Hematocrit 36.0 - 46.0 % 31.8  31.6  32.0   Platelets 150 - 400 K/uL 480  426  426   Trending down  10. Panic attacks/Anxiety d/o: Klonopin weaned down 05/30 to  0.5 mg TID.6/16 down to .25mg  TID , D/C ativan, resume prn xanax             --Seroquel 75 mg BID. Reduced to 50mg  BID  - appreciate CCM input  11. Cardiomyopathy: Continue Metoprolol 12.5 mg bid and Crestor.              --aldactone added 06/07-->monitor for orthostatic symptoms.  No sign of failure 6/14 12. Ileus/intermittent issues w/N/V: Has been refusing miralax and senna intermittently. Will decrease to Miralax to daily w/Senna S daily             --Keep Mg>2.0 and K>4.0 for adequate supplementation/recurrence.               --hypokalemia--kdur 20 TID and check lytes Monday             --add Mg for supplement.    Multiple BMs after sorbitol 2 d ago but no BM  yesterday , still on Reglan , no signs of ileus , may d/c, start sorbitol daily prn, has dulc supp  -Large BM yesterday, continue to follow 13. Pulmonary nodules: PCCM recommends repeat CT 6-8 weeks from 10/28/21 to monitor for resolution.  14. Interstitial cystitis s/p cystectomy and fistula repair: Followed by Dr.Evans.   70.  Dispo discussed need for assistance post d/c , husband reportedly undergoing chemotherapy treatment recently told he has Stage 4 , discuss in conf Wed , pt is anxious about care needs , has other family members who can assist  48. UTI  -U/A ordered, >50 wbc, bacterial, nitrates  -Keflex 250mg  q6h ordered for 5 days, await culture, +proteus 100K, await sensitivity 17. Hyponatremia, appears to be chronic, may be related to spironolactone       Latest Ref Rng & Units 11/26/2021    6:16 AM 11/19/2021    6:41 AM 11/16/2021    6:00 AM  BMP  Glucose 70 - 99 mg/dL 78  100  91   BUN 8 - 23 mg/dL 11  7  8    Creatinine 0.44 - 1.00 mg/dL 0.44  0.38  <0.30   Sodium 135 - 145 mmol/L 130  129  134   Potassium 3.5 - 5.1 mmol/L 4.3  4.2  4.2   Chloride 98 - 111 mmol/L 94  93  97   CO2 22 - 32 mmol/L 24  27  29    Calcium 8.9 - 10.3 mg/dL 8.5  8.6  8.5     LOS: 13 days A FACE TO FACE EVALUATION WAS PERFORMED  11/27/2021, 8:12 AM

## 2021-11-27 NOTE — Progress Notes (Signed)
Notified oncoming nurse patient's weight will need to be obtained. Patient requested to get weighed later when she gets out of bed due to bed having to be zeroed out [employer]).    Tilden Dome, LPN

## 2021-11-27 NOTE — Progress Notes (Signed)
Physical Therapy Session Note  Patient Details  Name: [name] MRN: [id number] Date of Birth: [birth year]  Today's Date: 11/27/2021 PT Individual Time: 1501-1528 PT Individual Time Calculation (min): 27 min  and Today's Date: 11/27/2021 PT Missed Time: 33 Minutes Missed Time Reason: Pain;Patient fatigue  Short Term Goals: Week 2:  PT Short Term Goal 1 (Week 2): Pt will ambulate 52ft with mod assist and LRAD with +2 for WC follow PT Short Term Goal 2 (Week 2): Pt will propell  WC >137ft with supervision assist PT Short Term Goal 3 (Week 2): Pt will trasnfer to and from [facility] with mod assist consistently with LRAD PT Short Term Goal 4 (Week 2): Pt will perform 5xSTS  Skilled Therapeutic Interventions/Progress Updates:  Patient supine in bed on entrance to room. Patient alert and complaining of stomach cramps. Education provided to pt re: maintaining mobility and positive effect on GI, especially with motility. Pt relates less digestive related pain but wonders if cramping is related to ingestion of broccoli even though she has not had a digestive concern after consuming broccoli in the past.   Therapeutic Activity: Bed Mobility: Pt willing to sit upright on EOB and  performed supine <> sit with CGA/ light MinA d/t fatigue. VC/ tc required for R elbow positioning. Pt is able to sit EOB and perform LAQs x10 bilaterally and then requests to return to supine d/t stomach cramping. Requires MinA for BLE. Instruction provided on descending to R elbow and pulling feet up to bed surface with bent knees as though she trying to curl up in a ball. Requires MinA for technique, then rolls to back into hooklying position. Is able to perform light bridging with hold to Bil ankles x3. Is able to assist in movement to [facility] with BUE and BLE and MinA.   Patient supine  in bed at end of session with brakes locked, and all needs within reach.  Pt missed 33 min of skilled therapy due to fatigue and stomach cramping.  Will re-attempt as schedule and pt availability permits.   Therapy Documentation Precautions:  Precautions Precautions: Fall Precaution Comments: trach, B foot drop Restrictions Weight Bearing Restrictions: No General: PT Amount of Missed Time (min): 33 Minutes PT Missed Treatment Reason: Pain;Patient fatigue Vital Signs:  Pain:  Stomach cramping pain related but no numerical grade provided. Request to RN for medication on pt request.   Therapy/Group: Individual Therapy  Loel Dubonnet PT, DPT, CSRS 11/27/2021, 6:52 PM

## 2021-11-27 NOTE — Progress Notes (Signed)
Occupational Therapy Session Note  Patient Details  Name: [name] MRN: [id number] Date of Birth: [birth year]  Today's Date: 11/27/2021 OT Individual Time: 1330-1429 OT Individual Time Calculation (min): 59 min    Short Term Goals: Week 1:  OT Short Term Goal 1 (Week 1): Pt will complete toilet transfer with max A and LRAD. OT Short Term Goal 1 - Progress (Week 1): Met OT Short Term Goal 2 (Week 1): Pt will complete sit to stand with min A of 2 and LRAD in prep for standing ADL. OT Short Term Goal 2 - Progress (Week 1): Not met OT Short Term Goal 3 (Week 1): Pt will don pants with max A of 1. OT Short Term Goal 3 - Progress (Week 1): Met Week 2:  OT Short Term Goal 1 (Week 2): Pt will complete sit to stand with min A of 2 and LRAD in prep for standing ADL. OT Short Term Goal 2 (Week 2): Pt will complete 1/3 toileting tasks with mod A. OT Short Term Goal 3 (Week 2): Pt will complete standing grooming task at sink with max A. OT Short Term Goal 4 (Week 2): Pt will don pants with mod A.  Skilled Therapeutic Interventions/Progress Updates:    Pt received seated in w/c , reports back pain from sitting up in w/c for awhile but, agreeable to therapy. Session focus on self-care retraining, BUE strengthening, activity tolerance, transfer retraining in prep for improved ADL/IADL/func mobility performance + decreased caregiver burden.  Squat-pivot to and from drop arm 3in1 over toilet with mod A of 2 and UE supported on armrest/grab bar. Total A to doff/don brief. Continent void of bladder/BM, completed seated pericare distant S. Sits to stand with LUE on grab bar with heavy mod A with difficulty coming into upright posture.  Pt complete 4 sit to stands with B HHA and use of back of chair for visual cue to extend trunk/hip with good results. Only able to maintain standing for ~10 seconds at a time due to fatigue. HR up to 135 bpm with activity.  Practiced scooting up / down mat with S with  increased time. Finally, completed 1x10 overhead tosses with volleyball > rebounder with S.   Self-propelled w/c with BUE > nurses sation with close S, transported remainder of way due to fatigue.  Mod A squat-pivot back to bed, mod A to progress BLE into supine.   Pt left with HOB over 30 degrees, call bell in reach, and all immediate needs met.    Therapy Documentation Precautions:  Precautions Precautions: Fall Precaution Comments: trach, B foot drop Restrictions Weight Bearing Restrictions: No  Pain: see above   ADL: See Care Tool for more details.  Therapy/Group: Individual Therapy  Volanda Napoleon MS, OTR/L  11/27/2021, 6:53 AM

## 2021-11-27 NOTE — Progress Notes (Signed)
Physical Therapy Session Note  Patient Details  Name: [name] MRN: [id number] Date of Birth: 04-Dec-1955  Today's Date: 11/27/2021 PT Individual Time: 1131-1200 PT Individual Time Calculation (min): 29 min   Short Term Goals: Week 2:  PT Short Term Goal 1 (Week 2): Pt will ambulate 67ft with mod assist and LRAD with +2 for WC follow PT Short Term Goal 2 (Week 2): Pt will propell  WC >131ft with supervision assist PT Short Term Goal 3 (Week 2): Pt will trasnfer to and from [facility] with mod assist consistently with LRAD PT Short Term Goal 4 (Week 2): Pt will perform 5xSTS  Skilled Therapeutic Interventions/Progress Updates:  Patient supine in bed on entrance to room. Patient alert and agreeable to PT session. Sister in room discussing nursing concerns as well as potential use or replacement of daybed that pt uses for bed. Possible recommendation for hospital bed on d/c.   Patient with no pain complaint throughout session.  Therapeutic Activity: Bed Mobility: Patient performed supine --> sit with CGA and vc/ tc required for technique and effort. Pants donned in supine with pt attempting lateral leans to complete pull of pants up to complete dressing but is unable to complete following several attempts.  Transfers: Patient performed sit<>stand at EOB with MinA +2 HHA bilaterally. Requires Max A to complete donning of pants. Squat pivot to w/c toward L side with MinA and vc for hand placement and technique. Pt is able to laterally scoot to better position self prior to attempt.   Sit<>stand at EVA walker with ModA  and light muscular engagement in BLE.   Gait Training:  Patient ambulated 20 ft using EVA walker with Min/ ModA . She requires control of walker, but is able to maintain upright posture, and advance feet with forward slide of toes. Fatigue noted around 15 ft but pt able to push another 5 ft with encouragement. No LE buckling or give. Provided vc/ tc for effort and increased hip/ knee  flexion.  Discussion of availability of "UpWalker" brand walker that provides rollator stability with forearm supports.   Wheelchair Mobility:  Patient propelled wheelchair 120 feet with supervision. Provided instruction for performing turn in place with pt able to return demo with ease.   Patient seated in w/c at end of session with brakes locked, no alarm set, and all needs within reach. NT in room making bed and RN requesting pt to stay sitting upright for lunch.    Therapy Documentation Precautions:  Precautions Precautions: Fall Precaution Comments: trach, B foot drop Restrictions Weight Bearing Restrictions: No General:   Vital Signs:  Pain: Pain Assessment Pain Scale: 0-10 Pain Score: 0-No pain  Therapy/Group: Individual Therapy  Loel Dubonnet PT, DPT, CSRS 11/27/2021, 1:03 PM

## 2021-11-28 LAB — POTASSIUM: Potassium: 4 mmol/L (ref 3.5–5.1)

## 2021-11-28 MED ORDER — CLONAZEPAM 0.25 MG PO TBDP
0.2500 mg | ORAL_TABLET | Freq: Two times a day (BID) | ORAL | Status: DC
  Administered 2021-11-28 – 2021-12-12 (×28): 0.25 mg via ORAL
  Filled 2021-11-28 (×29): qty 1

## 2021-11-28 MED ORDER — QUETIAPINE FUMARATE 25 MG PO TABS
25.0000 mg | ORAL_TABLET | Freq: Two times a day (BID) | ORAL | Status: DC
  Administered 2021-11-28 – 2021-12-02 (×8): 25 mg via ORAL
  Filled 2021-11-28 (×8): qty 1

## 2021-11-28 NOTE — Progress Notes (Signed)
Speech Language Pathology Daily Session Note  Patient Details  Name: [name] MRN: [id number] Date of Birth: 03/20/1956  Today's Date: 11/28/2021 SLP Individual Time: 1400-1445 SLP Individual Time Calculation (min): 45 min  Short Term Goals: Week 2: SLP Short Term Goal 1 (Week 2): Patient will demonstrate complex problem solving for functional tasks with supervision level verbal cues. SLP Short Term Goal 2 (Week 2): Patient will recal new, daily information with mod I for use of compensatory strategies  Skilled Therapeutic Interventions: Skilled ST treatment focused on cognitive goals. SLP and pt completing medication management task to increase awareness of current medication regime. SLP provided pt with individualized medication chart which contained the following: name of medication, dose, instructions, and purpose for taking. Pt identified pertinent information on chart with independence. SLP then loaded medications into a TID pill box with initial supervision verbal cue x1 for use of organizational strategies to minimize risk for error; completed rest of task with mod I with zero errors. Recommend family to assess pt's pillbox for accuracy at discharge and stand-by assist as needed with medication management. Pt in agreement. Pt engaging in functional discussion regarding financial management/bill paying with mod I and effective problem solving skills, verbal reasoning. Patient was left in bed with alarm activated and immediate needs within reach at end of session. Continue per current plan of care.      Pain Pain Assessment Pain Scale: 0-10 Pain Score: 0-No pain  Therapy/Group: Individual Therapy  Tamala Ser 11/28/2021, 3:54 PM

## 2021-11-28 NOTE — Progress Notes (Signed)
Gauze taped over pt stoma was loose. RT redressed stoma with new gauze and tape. Pt VS are stable.

## 2021-11-28 NOTE — Patient Care Conference (Cosign Needed)
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 Claudette Laws Social Worker Present: Lavera Guise, BSW Nurse Present: Chana Bode, RN PT Present: Casimiro Needle, PT OT Present: Annye English, OT SLP Present: Eilene Ghazi, SLP PPS Coordinator present : Fae Pippin, SLP     Current Status/Progress Goal Weekly Team Focus  Bowel/Bladder   continent of bowel and bladder.  remain continent.  Assess with bathroom privelges as requested and staff continue checking/rounding on patient q2 hours.   Swallow/Nutrition/ Hydration             ADL's   set-up for seated UB ADL at sink, max A stand-pivot to BSC, mod to max A for toileting tasks, very limited by fatigue  MIN A overall, mod I seated UB ADL  set-up for seated UB ADL at sink, max A stand-pivot to [facility], total A for toileting tasks, very limited by fatigue/anxiousness with mobility   Mobility   min-mod assist bed mobility. Min up to max assist transfers dependent on fatigue and strength levels. sit<>stand and gait in parallel bars with mod/max A and w/c follow, gait with EVA walker up to 20 feet with ModA and w/c follow.  min assist bed mobility and transfers. min-mod assist gait. mod I WC propulsion.  endurance. activity tolerance. BLE strengthening. transfers. WC propulsion,  family ed   Communication   goals discontinued due to trach decannulated  NA  NA   Safety/Cognition/ Behavioral Observations  sup A  Problem-solving (complex daily situations) - Mod I; Recall (daily complex information) - Mod I  functional cognitive tasks, high level problem solving, memory   Pain   keep pain under control  Assess Q4 and PRN.  keep pain minimized. Assess Q4 and prn.   Skin   Tracheostomy site and Stage 2 pressure injury to left gluteal fold.  Promote wound healing, repositioning, and prevent skin impairment.  Assess Q4 and PRN.     Discharge Planning:  Discharging home with assistance from sister, spouse recently hospitalized   Team Discussion: Patient with trach out. Keflex for UTI, chronic pain med and small sacral stage 2 wound  treated with medi-honey and foam.  Patient on target to meet rehab goals: yes, currently needs set up for upper body care seated. Requires max assist for stand pivot to [facility] and mod assist for toileting. Needs min - max assist for transfers and mod - max assist for sit - stand. Requires supervision for cognitive tasks,  and problem solving. Overall goals for discharge set for min assist.   *See Care Plan and progress notes for long and short-term goals.   Revisions to Treatment Plan:  Review extension of stay; LOS based on the impression that should would have her neighbor stay over at times with her, but that is not the case, she will only have  PRN assist at most apart from her spouse who is disabled. At least a one week extension would be appropriate to get her to a consistent CGA/ S level for [facility] transfers/toileting/LBD from a w/c / bed level. Team concerned she will be bed bound and unable to have timely hygenic care due to incontinence.  Carley Hammed walker trial  Teaching Needs: Safety, medications, skin care, secondary risk management, transfers, toileting, etc  Current Barriers to Discharge: Decreased caregiver support, Home enviroment  access/layout, and Wound care  Possible Resolutions to Barriers: Family education HH follow up services DME:DA-BSC Recommend w/c ramp for entry to home     Medical Summary Current Status: Decannulated since last week, hypokalemia improved, weaning Seroquel.  Ileus resolved  Barriers to Discharge: Medical stability   Possible Resolutions to Barriers/Weekly Focus: This medication , poly pharmacy   Continued Need for Acute Rehabilitation Level of Care: The patient requires daily medical management by a physician with specialized training in physical medicine and rehabilitation for the following reasons: Direction of a multidisciplinary physical rehabilitation program to maximize functional independence : Yes Medical management of patient stability for increased activity during participation in an intensive rehabilitation regime.: Yes Analysis of laboratory values and/or radiology reports with any subsequent need for medication adjustment and/or medical intervention. : Yes   I attest that I was present, lead the team conference, and concur with the assessment and plan of the team.   Chana Bode B 11/28/2021, 1:21 PM

## 2021-11-28 NOTE — Progress Notes (Signed)
Occupational Therapy Session Note  Patient Details  Name: [name] MRN: [id number] Date of Birth: 01-Jun-1956  Today's Date: 11/28/2021 OT Individual Time: 1120-1201 OT Individual Time Calculation (min): 41 min    Short Term Goals: Week 1:  OT Short Term Goal 1 (Week 1): Pt will complete toilet transfer with max A and LRAD. OT Short Term Goal 1 - Progress (Week 1): Met OT Short Term Goal 2 (Week 1): Pt will complete sit to stand with min A of 2 and LRAD in prep for standing ADL. OT Short Term Goal 2 - Progress (Week 1): Not met OT Short Term Goal 3 (Week 1): Pt will don pants with max A of 1. OT Short Term Goal 3 - Progress (Week 1): Met  Skilled Therapeutic Interventions/Progress Updates:    Pt received semi-reclined in bed, no c/o pain and, agreeable to therapy. Session focus on self-care retraining, activity tolerance, transfer retraining in prep for improved ADL/IADL/func mobility performance + decreased caregiver burden. Pt requesting ELOS due to not having 24/7 S, and only PRN assist from sister/neighbor. Husband with recent stroke and unable to care for himself/her. Will discuss with team.  Came to sitting EOB with bed rail and close S. Lateral scoot pivot with min to mod A for initiation to block B feet > w/c throughout session, cues for technique.   Completed 2 sit to stands with mod A of 2 to extend trunk/hips with RW, able to throw 5 bean bags > cornhole board before needing to sit down due to fatigue. HR up to 147 bpm post activity.   Pt practiced long sitting/circle sitting and simulating LBD with use of theraband loop but required mod A to thread BLE and unable to achieve figure 4 position.   Weighed pt in w/c per RN request.   Sit to stand x3 with BUE on sink with min A as pt changed out brief post small incontinence episode.  Pt left seated in w/c with safety belt alarm engaged, call bell in reach, and all immediate needs met.    Therapy  Documentation Precautions:  Precautions Precautions: Fall Precaution Comments: trach, B foot drop Restrictions Weight Bearing Restrictions: No  Pain: no c/o throughout   ADL: See Care Tool for more details.   Therapy/Group: Individual Therapy  Volanda Napoleon MS, OTR/L  11/28/2021, 7:00 AM

## 2021-11-28 NOTE — Progress Notes (Signed)
PROGRESS NOTE   Subjective/Complaints:  Good day in therapy stood more, has taken steps with EVA walker , having BMs daily No significant pain   ROS: Patient denies CP, SOB, abd pain,  Cough, vision changes  Objective:   No results found. Recent Labs    11/26/21 0616  WBC 15.2*  HGB 10.4*  HCT 31.8*  PLT 480*    Recent Labs    11/26/21 0616 11/28/21 0649  NA 130*  --   K 4.3 4.0  CL 94*  --   CO2 24  --   GLUCOSE 78  --   BUN 11  --   CREATININE 0.44  --   CALCIUM 8.5*  --      Intake/Output Summary (Last 24 hours) at 11/28/2021 0912 Last data filed at 11/28/2021 [id number] Gross per 24 hour  Intake 474 ml  Output --  Net 474 ml      Pressure Injury 11/03/21 Coccyx Medial Stage 2 -  Partial thickness loss of dermis presenting as a shallow open injury with a red, pink wound bed without slough. (Active)  11/03/21 1556  Location: Coccyx  Location Orientation: Medial  Staging: Stage 2 -  Partial thickness loss of dermis presenting as a shallow open injury with a red, pink wound bed without slough.  Wound Description (Comments):   Present on Admission:     Physical Exam: Vital Signs Blood pressure 108/63, pulse 91, temperature 98.4 F (36.9 C), resp. rate 19, height 5\' 1"  (1.549 m), weight 48.6 kg, SpO2 97 %.    General: No acute distress, in bed Mood and affect are appropriate Heart: Regular rate and rhythm no rubs murmurs or extra sounds Lungs: Clear to auscultation, breathing unlabored, no rales or wheezes Abdomen: Positive bowel sounds, soft nontender to palpation, nondistended Extremities: No clubbing, cyanosis, or edema   decannulated with gauze over stoma  Ext: no clubbing, cyanosis, or edema Psych: pleasant and cooperative   Skin: warm and dry, stage II coccyx 1cm with fibronecrotic debris in center Neurologic: Cranial nerves II through XII intact, motor strength is 4/5 in bilateral deltoid,  bicep, tricep, grip,  trace hip flexor, knee extensors,0/5  ankle dorsiflexor and trace plantar flexor Sensory exam mildly reduced sensation to LT in feet, proprioception intact--no change  Musculoskeletal: Full range of motion in UE , limited AROM in LE due to weakness  No joint swelling   Assessment/Plan: 1. Functional deficits which require 3+ hours per day of interdisciplinary therapy in a comprehensive inpatient rehab setting. Physiatrist is providing close team supervision and 24 hour management of active medical problems listed below. Physiatrist and rehab team continue to assess barriers to discharge/monitor patient progress toward functional and medical goals  Care Tool:  Bathing    Body parts bathed by patient: Face         Bathing assist Assist Level: Supervision/Verbal cueing     Upper Body Dressing/Undressing Upper body dressing   What is the patient wearing?: Pull over shirt    Upper body assist Assist Level: Minimal Assistance - Patient > 75%    Lower Body Dressing/Undressing Lower body dressing      What is the patient  wearing?: Incontinence brief     Lower body assist Assist for lower body dressing: Dependent - Patient 0%     Toileting Toileting    Toileting assist Assist for toileting: Maximal Assistance - Patient 25 - 49%     Transfers Chair/bed transfer  Transfers assist  Chair/bed transfer activity did not occur: N/A  Chair/bed transfer assist level: Moderate Assistance - Patient 50 - 74%     Locomotion Ambulation   Ambulation assist   Ambulation activity did not occur: Safety/medical concerns  Assist level: Maximal Assistance - Patient 25 - 49% Assistive device: Fara Boros Max distance: 12 ft   Walk 10 feet activity   Assist  Walk 10 feet activity did not occur: Safety/medical concerns        Walk 50 feet activity   Assist Walk 50 feet with 2 turns activity did not occur: Safety/medical concerns         Walk 150  feet activity   Assist Walk 150 feet activity did not occur: Safety/medical concerns         Walk 10 feet on uneven surface  activity   Assist Walk 10 feet on uneven surfaces activity did not occur: Safety/medical concerns         Wheelchair     Assist Is the patient using a wheelchair?: Yes Type of Wheelchair: Manual Wheelchair activity did not occur: Safety/medical concerns  Wheelchair assist level: Supervision/Verbal cueing Max wheelchair distance: 150 ft    Wheelchair 50 feet with 2 turns activity    Assist    Wheelchair 50 feet with 2 turns activity did not occur: Safety/medical concerns   Assist Level: Contact Guard/Touching assist   Wheelchair 150 feet activity     Assist  Wheelchair 150 feet activity did not occur: Safety/medical concerns   Assist Level: Supervision/Verbal cueing   Blood pressure 108/63, pulse 91, temperature 98.4 F (36.9 C), resp. rate 19, height 5\' 1"  (1.549 m), weight 48.6 kg, SpO2 97 %.  Medical Problem List and Plan: 1. 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  Antithrombotics: -DVT/anticoagulation:  Pharmaceutical: Lovenox             -antiplatelet therapy: N/a 3. Pain:  Managed by Dr.Phillips. Off Methadone and tramadol- --Oxycodone 5 mg TID with tylenol prn for pain.consider transition to tramadol would avoid methadone due to pulmonary depression issues, pharmacodynamics  4. Mood: Team to provide ego support/encouragement. LCSW to follow for evaluation and support.              -antipsychotic agents:  N/A 5. Neuropsych: This patient is capable of making decisions on her own behalf. 6. Stage 2 decubitus ulcer: Routine pressure relief measures.              --continue air mattress  overlay for MASD/Stage 2 decub.   6/11 begin medihoney to wound daily 7. Fluids/Electrolytes/Nutrition: Strict I/O. Continue regular diet            Hypo K improved after supplementation, will d/c KCL recheck on Friday ,     Latest Ref Rng & Units 11/28/2021    6:49 AM  11/26/2021    6:16 AM 11/19/2021    6:41 AM  BMP  Glucose 70 - 99 mg/dL  78  357   BUN 8 - 23 mg/dL  11  7   Creatinine 0.17 - 1.00 mg/dL  7.93  9.03   Sodium 009 - 145 mmol/L  130  129   Potassium 3.5 - 5.1 mmol/L 4.0  4.3  4.2   Chloride 98 - 111 mmol/L  94  93   CO2 22 - 32 mmol/L  24  27   Calcium 8.9 - 10.3 mg/dL  8.5  8.6   Well hydrated Repeat labs tomorrow  8. COPD w/VDRF/Trach dependent: Continue ATC 5-8 liters --followed by Dr. Delton Coombes.  --Keep saturation 88-92% per pulmonary --encourage pulmonary hygiene  --Continue Singulair, Yuperi, Brovana and Pulmicort nebs.  -6/16 Decannulated 9. Leucocytosis: Down vs peak but still elevated, afebrile ,cont to monitor     Latest Ref Rng & Units 11/26/2021    6:16 AM 11/19/2021    6:41 AM 11/15/2021   10:45 AM  CBC  WBC 4.0 - 10.5 K/uL 15.2  16.5  14.9   Hemoglobin 12.0 - 15.0 g/dL 23.3  9.9  00.7   Hematocrit 36.0 - 46.0 % 31.8  31.6  32.0   Platelets 150 - 400 K/uL 480  426  426   Trending down  10. Panic attacks/Anxiety d/o: Klonopin weaned down 05/30 to  0.5 mg TID.6/16 down to .25mg  TID , D/C ativan, resume prn xanax             --Seroquel 75 mg BID. Reduced to 50mg  BID  - appreciate CCM input  11. Cardiomyopathy: Continue Metoprolol 12.5 mg bid and Crestor.              --aldactone added 06/07-->monitor for orthostatic symptoms.  No sign of failure 6/21 12. Ileus/intermittent issues w/N/V: Has been refusing miralax and senna intermittently. Will decrease to Miralax to daily w/Senna S daily             --Keep Mg>2.0 and K>4.0 for adequate supplementation/recurrence.               --hypokalemia--kdur 20 TID and check lytes Monday             --add Mg for  supplement.    Multiple BMs after sorbitol 2 d ago but no BM yesterday , still on Reglan , no signs of ileus , may d/c, start sorbitol daily prn, has dulc supp  -daily BMs  13. Pulmonary nodules: PCCM recommends repeat CT 6-8 weeks from 10/28/21 to monitor for resolution.  14. Interstitial cystitis s/p cystectomy and fistula repair: Followed by Dr.Evans.   15.  Dispo discussed need for assistance post d/c , husband reportedly undergoing chemotherapy treatment recently told he has Stage 4 , discuss in conf Wed , pt is anxious about care needs , has other family members who can assist  16. UTI  -U/A ordered, >50 wbc, bacterial, nitrates  -Keflex 250mg  q6h ordered for 5 days, await culture, +proteus 100K, sensitivity to Keflex  17. Hyponatremia, appears to be chronic, may be related to spironolactone       Latest Ref Rng & Units 11/28/2021    6:49 AM 11/26/2021    6:16 AM 11/19/2021    6:41 AM  BMP  Glucose 70 - 99 mg/dL  78  11/30/2021   BUN 8 - 23 mg/dL  11  7   Creatinine 11/28/2021 - 1.00 mg/dL  01/19/2022  0.38   Sodium 135 - 145 mmol/L  130  129   Potassium 3.5 - 5.1 mmol/L 4.0  4.3  4.2   Chloride 98 - 111 mmol/L  94  93   CO2 22 - 32 mmol/L  24  27   Calcium 8.9 - 10.3 mg/dL  8.5  8.6     LOS: 14 days A FACE TO FACE EVALUATION WAS PERFORMED  Charlett Blake 11/28/2021, 9:12 AM

## 2021-11-28 NOTE — Progress Notes (Signed)
Patient ID: [name], female   DOB: 06-30-1955, 66 y.o.   MRN: [id number] Team Conference Report to Patient/Family  Team Conference discussion was reviewed with the patient and caregiver, including goals, any changes in plan of care and target discharge date.  Patient and caregiver express understanding and are in agreement.  The patient has a target discharge date of 12/07/21.  Sw met with patient and provided team conference updates. Sw made attempt to call patient sister, unable to leave VM. Patient reports that she has been informed by OT, during her therapy session that she can anticipate an extension on her d/c date. Sw informed patient that this was not discussed in conference and the current discharge date remains at the moment. Sw will follow up with physician and therapy team, no additional questions or concerns.   [name] 11/28/2021, 1:36 PM

## 2021-11-28 NOTE — Progress Notes (Signed)
Physical Therapy Session Note  Patient Details  Name: [name] MRN: [id number] Date of Birth: [birth year]  Today's Date: 11/28/2021 PT Individual Time: 0903-1001 and 1302-1359 PT Individual Time Calculation (min): 58 min and 57 min  Short Term Goals: Week 1:  PT Short Term Goal 1 (Week 1): Pt will complete bed mobility with mod A x 1 PT Short Term Goal 1 - Progress (Week 1): Met PT Short Term Goal 2 (Week 1): Pt will complete least restrictive transfer with assist x 1 PT Short Term Goal 2 - Progress (Week 1): Met PT Short Term Goal 3 (Week 1): Pt will initiate gait training as safe and able PT Short Term Goal 3 - Progress (Week 1): Met PT Short Term Goal 4 (Week 1): Pt will initiate w/c mobility as safe and able PT Short Term Goal 4 - Progress (Week 1): Met PT Short Term Goal 5 - Progress (Week 1): Other (comment) Week 2:  PT Short Term Goal 1 (Week 2): Pt will ambulate 59f with mod assist and LRAD with +2 for WC follow PT Short Term Goal 2 (Week 2): Pt will propell  WC >154fwith supervision assist PT Short Term Goal 3 (Week 2): [facility] consistently with LRAD PT Short Term Goal 4 (Week 2): Pt will perform 5xSTS  Skilled Therapeutic Interventions/Progress Updates:  Session 1: Patient supine in bed on entrance to room. Patient alert and agreeable to PT session. Is able to pull bed covers off on her own.  Patient with no pain complaint throughout session.  Therapeutic Activity: Bed Mobility: Patient performed supine --> sit with supervision throughout session. VC/ tc required for increased effort to push with R elbow from bed surface in order to initiate rise to seated position. At end of session, she returns to supine  with brief light MinA to bring feet to bed surface. MinA to [facility] and BLE assist from pt.  Transfers: Patient performed sit<>stand transfers throughout session using Bil HHA to PT's elbows. Requires  Mod/  Max A to initiate with pt's extensor chain activating ~50% of rise and completing CGA. Pt does not trust BLE and initially stands with extensor push into seated surface.  Provided verbal cues for technique and effort.  Neuromuscular Re-ed: NMR facilitated during session with focus on standing balance/ tolerance and controlled rise to stand/ descent to sit. Sit<>stand transfers from w/c throughout session using Bil HHA to PT's elbows. Able to maintain stance for 46m55mbouts. Pt guided in minisquats 2x5 as well as stance with close supervision for pt to attain and maintain stance. Continued education provided re: need for forward lean to initiate all transfers.   Following standing bouts, pt's HR increases to 130 bpm and requires extensive time to recover.   NMR performed for improvements in motor control and coordination, balance, sequencing, judgement, and self confidence/ efficacy in performing all aspects of mobility at highest level of independence.   Patient supine  in bed at end of session with brakes locked, bed alarm set, and all needs within reach.  Session 2: Patient supine in bed and completing luch on entrance to room. Patient alert and agreeable to PT session, however would like to finish some more of her lunch. Pt urged to eat for increased energy and healing.   Patient with no pain complaint throughout session.  Therapeutic Activity: Lengthy discussion with pt re: therapy staff recommendation for 24/7 physical assist and  supervision for pt in order to return home safely. When pt relates several family members and neighbors who may be available intermittently for brief periods, pt reminded re: incontinence that was present PTA and who would be able to assist her with all toileting as soiled briefs associated with skin breakdown. It is determined that pt currently does not have adequate help for d/c and may need time and resources/ information re: types of assistance that may be available  to hire.   Therapeutic Exercise/ Activity/ NMR: Patient performed the following exercises with vc/ tc for proper technique in order to increase muscle fiber fiber facilitation and increased NM activity:   1x10 bridging with 3sec holds 1x10 BLE heel slides with light AAROM 1x10 BLE LAQ in supine with hold to thigh 1x10 BLE hip/ knee extensions in supine  Bed Mobility: Patient performed repositioning toward HOB using BUE and BLE with supervision following light MinA for BLE positioning into hooklying. VC/ tc required for effort/ positioning.  Patient supine  in bed at end of session with brakes locked, and all needs within reach.   Therapy Documentation Precautions:  Precautions Precautions: Fall Precaution Comments: trach, B foot drop Restrictions Weight Bearing Restrictions: No General:   Vital Signs:  Pain:  No pain complaint this day.   Therapy/Group: Individual Therapy  Alger Simons PT, DPT, CSRS 11/28/2021, 1:02 PM

## 2021-11-29 ENCOUNTER — Inpatient Hospital Stay (HOSPITAL_COMMUNITY): Payer: Medicare Other

## 2021-11-29 ENCOUNTER — Encounter (HOSPITAL_COMMUNITY): Payer: Medicare Other

## 2021-11-29 MED ORDER — METOCLOPRAMIDE HCL 5 MG/ML IJ SOLN
5.0000 mg | Freq: Two times a day (BID) | INTRAMUSCULAR | Status: DC
  Administered 2021-11-29 – 2021-11-30 (×3): 5 mg via INTRAVENOUS
  Filled 2021-11-29 (×3): qty 2

## 2021-11-29 NOTE — Progress Notes (Incomplete)
Occupational Therapy Weekly Progress Note  Patient Details  Name: [name] MRN: [id number] Date of Birth: [birth year]  Beginning of progress report period: November 23, 2021 End of progress report period: {Time; dates multiple:[id number]}  Patient has met 1 of 4 short term goals.  Pt has made *** progress this week towards LTG. Pt has demonstrated improved *** to presently complete *** at *** level. Pt cont to be primarily limited by ***. ***UE and hand currently assessed at [place] level *** and *** respectively. Anticipate *** S and *** physical assist required upon DC. Family education will be completed most likely with *** closer to DC.   Patient continues to demonstrate the following deficits: {musculoskeletal:3041633}, {cardiopulmonary:3041634}, {neuromotor:3041635}, and {multisystem:3041640} and therefore will continue to benefit from skilled OT intervention to enhance overall performance with {ADL/iADL:3041649}.  Patient not progressing toward long term goals.  See goal revision..  Plan of care revisions: ***.  OT Short Term Goals {OT CYE:1859093}  Volanda Napoleon MS, OTR/L  11/29/2021, 12:40 PM

## 2021-11-29 NOTE — Progress Notes (Signed)
Speech Language Pathology Discharge Summary  Patient Details  Name: [name] MRN: [id number] Date of Birth: 09/02/1955  Today's Date: 11/29/2021 SLP Individual Time: 1000-1030 SLP Individual Time Calculation (min): 30 min and Today's Date: 11/29/2021 SLP Missed Time: 15 Minutes Missed Time Reason: Patient ill (Comment) (nausea, fatigue; nurse aware)  Skilled Therapeutic Interventions:  Skilled ST treatment focused on cognitive goals. Pt agreeable to cognitive reassessment via the [place] (SLUMS). Pt achieved a score of 28/30 which within the normal range. Patient feels she is currently at cognitive baseline which is further supported by her sister. Pt is completing complex cognitive tasks with mod I in regards to problem solving and functional recall. Further SLP intervention does not appear indicated at this time. Pt in agreement. ST to sign off. Of note, pt requesting to conclude session earlier due to not feeling well. Pt's nurse is aware. Pt denied needs to address at this time and appreciative of additional time to rest. Pt missed 15 minutes of treatment. Patient was left in bed with alarm activated and immediate needs within reach at end of session.   Patient has met 3 of 3 long term goals.  Patient to discharge at overall Modified Independent level.  Reasons goals not met: All goals met   Clinical Impression/Discharge Summary: Patient has made excellent gains and has met 3 of 3 long-term goals this admission. PMSV goals were discontinued due to decannulated trach. Pt tolerating well with adequate vocal intensity and clarity. Patient is currently completing complex cognitive tasks at overall mod I level in regards to high level problem solving, functional recall, and executive functions. Pt and sister support that pt is likely at baseline function from a cognitive standpoint and in agreement with discharge from [place]. Patient/family education  is complete and patient to discharge at overall mod I level. Patient's care partner is independent to provide the necessary assistance at discharge. Follow up speech services do not appear clinically warranted at this time.   Care Partner:  Caregiver Able to Provide Assistance: Yes  Type of Caregiver Assistance: Physical;Cognitive  Recommendation:  None     Equipment: None recommended or provided   Reasons for discharge: Treatment goals met   Patient/Family Agrees with Progress Made and Goals Achieved: Yes    Patty Sermons 11/29/2021, 8:27 PM

## 2021-11-29 NOTE — Plan of Care (Signed)
  Problem: RH Problem Solving Goal: LTG Patient will demonstrate problem solving for (SLP) Description: LTG:  Patient will demonstrate problem solving for basic/complex daily situations with cues  (SLP) Outcome: Completed/Met   Problem: RH Memory Goal: LTG Patient will demonstrate ability for day to day (SLP) Description: LTG:   Patient will demonstrate ability for day to day recall/carryover during cognitive/linguistic activities with assist  (SLP) Outcome: Completed/Met Goal: LTG Patient will use memory compensatory aids to (SLP) Description: LTG:  Patient will use memory compensatory aids to recall biographical/new, daily complex information with cues (SLP) Outcome: Completed/Met   

## 2021-11-29 NOTE — Progress Notes (Signed)
Physical Therapy Note  Patient Details  Name: [name] MRN: [id number] Date of Birth: 1956/05/02 Today's Date: 11/29/2021    Attempted to see patient for scheduled therapy session. Pt reports she has not been feeling well this date with ongoing nausea and was unable to participate in earlier therapy sessions. Encouraged pt to sit up to EOB or change positions in bed, pt declines. Nursing aware of pt's symptoms. Pt missed 75 min of scheduled therapy session due to not feeling well.    Peter Congo, PT, DPT, CSRS 11/29/2021, 3:24 PM

## 2021-11-29 NOTE — Progress Notes (Signed)
Occupational Therapy Note  Patient Details  Name: [name] MRN: [id number] Date of Birth: 27-Jul-1955   Attempted to see pt to make up missed minutes from earlier am session. Pt semi-reclined in bed, continues to politely decline therapy at this time due to feeling "blah"/ ill. Offered bed level activity or ADL and pt continues to decline. Left semi-reclined in bed, call bell in reach, all immediate needs met.   Volanda Napoleon MS, OTR/L  11/29/2021, 2:16 PM

## 2021-11-29 NOTE — Progress Notes (Signed)
Recreational Therapy Session Note  Patient Details  Name: [name] MRN: [id number] Date of Birth: December 19, 1955 Today's Date: 11/29/2021  Attempted eval at bedside.  Pt sleeping and did not arouse to voice.  Previous notes state pt declined previous therapy sessions today due to not feeling well.  Will attempt eval at another time. Quadry Kampa 11/29/2021, 2:35 PM

## 2021-11-29 NOTE — Progress Notes (Signed)
Occupational Therapy Note  Patient Details  Name: [name] MRN: [id number] Date of Birth: 12/31/1955  Today's Date: 11/29/2021 OT Missed Time: 30 Minutes Missed Time Reason: Patient ill (comment) (n/v)  Pt in bed upon arrival with wash cloth on forehead and emesis bag at side. Pt states she has been nauseous through the night and just doesn't "feel right." Pt unable to participate. Will check back later as time allows.   Lavone Neri [facility] 11/29/2021, 10:02 AM

## 2021-11-29 NOTE — Progress Notes (Signed)
Occupational Therapy Session Note  Patient Details  Name: [name] MRN: [id number] Date of Birth: 1955/08/21  Today's Date: 11/29/2021 OT Missed Time: 75 Minutes Missed Time Reason: Patient ill (comment);Other (comment) (IV team present then pt declining therapy due to nausea)  Attempted to see pt at scheduled therapy time, IV nurse present inserting new IV. Returned 15 min later as IV team finished. Pt politely declining therapy at this time due to nausea. LPN made aware of pt request for nausea medication. Offered ADL and pt continued to politely, but adamantly decline therapy at this time due to feeling ill. Pt missed 75 min of scheduled OT.  Claudie Revering MS, OTR/L  11/29/2021, 6:51 AM

## 2021-11-30 LAB — BASIC METABOLIC PANEL
Anion gap: 10 (ref 5–15)
BUN: 8 mg/dL (ref 8–23)
CO2: 26 mmol/L (ref 22–32)
Calcium: 8.2 mg/dL — ABNORMAL LOW (ref 8.9–10.3)
Chloride: 90 mmol/L — ABNORMAL LOW (ref 98–111)
Creatinine, Ser: 0.34 mg/dL — ABNORMAL LOW (ref 0.44–1.00)
GFR, Estimated: >60 mL/min (ref >60)
Glucose, Bld: 105 mg/dL — ABNORMAL HIGH (ref 70–99)
Potassium: 2.8 mmol/L — ABNORMAL LOW (ref 3.5–5.1)
Sodium: 126 mmol/L — ABNORMAL LOW (ref 135–145)

## 2021-11-30 LAB — MAGNESIUM: Magnesium: 1.8 mg/dL (ref 1.7–2.4)

## 2021-11-30 MED ORDER — POTASSIUM CHLORIDE 10 MEQ/100ML IV SOLN
10.0000 meq | Freq: Once | INTRAVENOUS | Status: AC
  Administered 2021-11-30: 10 meq via INTRAVENOUS
  Filled 2021-11-30: qty 100

## 2021-11-30 MED ORDER — PROCHLORPERAZINE MALEATE 5 MG PO TABS
5.0000 mg | ORAL_TABLET | ORAL | Status: DC | PRN
  Administered 2021-11-30: 10 mg via ORAL
  Filled 2021-11-30: qty 2

## 2021-11-30 MED ORDER — POTASSIUM CHLORIDE 10 MEQ/100ML IV SOLN
10.0000 meq | INTRAVENOUS | Status: AC
  Administered 2021-11-30 (×5): 10 meq via INTRAVENOUS
  Filled 2021-11-30 (×6): qty 100

## 2021-11-30 MED ORDER — MAGNESIUM OXIDE -MG SUPPLEMENT 400 (240 MG) MG PO TABS
400.0000 mg | ORAL_TABLET | Freq: Two times a day (BID) | ORAL | Status: AC
  Administered 2021-11-30 – 2021-12-01 (×4): 400 mg via ORAL
  Filled 2021-11-30 (×4): qty 1

## 2021-11-30 MED ORDER — POTASSIUM CHLORIDE 10 MEQ/100ML IV SOLN
10.0000 meq | INTRAVENOUS | Status: DC
  Filled 2021-11-30 (×6): qty 100

## 2021-11-30 MED ORDER — METOCLOPRAMIDE HCL 5 MG/ML IJ SOLN: 5.0000 mg | Freq: Three times a day (TID) | INTRAMUSCULAR | Status: DC | PRN

## 2021-11-30 NOTE — Progress Notes (Addendum)
Patient ID: [name], female   DOB: [birth year], 66 y.o.   MRN: [id number]  Patient [facility] referral sent to [facility] Patient approved for SN/PT/OT

## 2021-11-30 NOTE — Progress Notes (Signed)
Physical Therapy Session Note  Patient Details  Name: [name] MRN: [id number] Date of Birth: 12/29/1955  Today's Date: 11/30/2021 PT Individual Time: 0930-1000 PT Individual Time Calculation (min): 30 min   Short Term Goals: Week 2:  PT Short Term Goal 1 (Week 2): Pt will ambulate 63ft with mod assist and LRAD with +2 for WC follow PT Short Term Goal 2 (Week 2): Pt will propell  WC >131ft with supervision assist PT Short Term Goal 3 (Week 2): Pt will trasnfer to and from [facility] with mod assist consistently with LRAD PT Short Term Goal 4 (Week 2): Pt will perform 5xSTS  Skilled Therapeutic Interventions/Progress Updates:      Pt resting in bed to start. She's hesitant but agreeable to PT tx. Denies pain but reports mild nausea - she received medications from RN prior. Pt requesting bed level there-ex only - unable to encourage her to mobilize OOB. Retrieved disposable scrub pants and these were donned with maxA. Roll's in bed with minA in both directions with reliance of bed rails. Noted urine to be heavily saturated of urine, incontinent of bladder. TotalA for brief change at bed level. Completed supine there-ex with SLR and hip abduction bilaterally, AAROM 2x15. TR also in room during session. RN entering room for further Rx. Pt repositioned in bed with +2 minA. All needs met, call bell in reach.   Therapy Documentation Precautions:  Precautions Precautions: Fall Precaution Comments: trach, B foot drop Restrictions Weight Bearing Restrictions: No General:     Therapy/Group: Individual Therapy  Orrin Brigham 11/30/2021, 7:43 AM

## 2021-12-01 LAB — BASIC METABOLIC PANEL
Anion gap: 7 (ref 5–15)
BUN: 7 mg/dL — ABNORMAL LOW (ref 8–23)
CO2: 26 mmol/L (ref 22–32)
Calcium: 8.1 mg/dL — ABNORMAL LOW (ref 8.9–10.3)
Chloride: 96 mmol/L — ABNORMAL LOW (ref 98–111)
Creatinine, Ser: 0.31 mg/dL — ABNORMAL LOW (ref 0.44–1.00)
GFR, Estimated: >60 mL/min (ref >60)
Glucose, Bld: 82 mg/dL (ref 70–99)
Potassium: 3.9 mmol/L (ref 3.5–5.1)
Sodium: 129 mmol/L — ABNORMAL LOW (ref 135–145)

## 2021-12-01 LAB — MAGNESIUM: Magnesium: 1.8 mg/dL (ref 1.7–2.4)

## 2021-12-01 MED ORDER — MONTELUKAST SODIUM 10 MG PO TABS
10.0000 mg | ORAL_TABLET | Freq: Every morning | ORAL | Status: DC
  Administered 2021-12-01 – 2021-12-12 (×12): 10 mg via ORAL
  Filled 2021-12-01 (×11): qty 1

## 2021-12-01 MED ORDER — SENNOSIDES-DOCUSATE SODIUM 8.6-50 MG PO TABS
3.0000 | ORAL_TABLET | Freq: Every day | ORAL | Status: DC
  Administered 2021-12-01 – 2021-12-03 (×3): 3 via ORAL
  Filled 2021-12-01 (×5): qty 3

## 2021-12-01 MED ORDER — SORBITOL 70 % SOLN: 30.0000 mL | Freq: Every day | Status: DC | PRN

## 2021-12-01 NOTE — Progress Notes (Signed)
Occupational Therapy Session Note  Patient Details  Name: [name] MRN: [id number] Date of Birth: [birth year]  Today's Date: 12/01/2021 OT Individual Time: 1405-1420 OT Individual Time Calculation (min): 15 min    Short Term Goals: Week 1:  OT Short Term Goal 1 (Week 1): Pt will complete toilet transfer with max A and LRAD. OT Short Term Goal 1 - Progress (Week 1): Met OT Short Term Goal 2 (Week 1): Pt will complete sit to stand with min A of 2 and LRAD in prep for standing ADL. OT Short Term Goal 2 - Progress (Week 1): Not met OT Short Term Goal 3 (Week 1): Pt will don pants with max A of 1. OT Short Term Goal 3 - Progress (Week 1): Met  Skilled Therapeutic Interventions/Progress Updates: Patientc/o fatigue but concerned to complete UE endurance, bed mobility and grooming of applying lotion to arms and legs.   Patient able to [facility] lower extremeities for crossing to apply and rub in lotion.  Patient vital signs stable at:  O2 room air=99, BP supine with bed with HOB elevated= 103/58, pulse 96.  Will offer OT services next scheduled date.    Continue OT Plan of care.     Therapy Documentation Precautions:  Precautions Precautions: Fall Precaution Comments: trach stoma,  B foot drop Restrictions Weight Bearing Restrictions: No General: General OT Amount of Missed Time: 15 Minutes  Pain:denied     Therapy/Group: Individual Therapy  Bud Face [facility] 12/01/2021, 5:05 PM

## 2021-12-01 NOTE — Progress Notes (Signed)
PROGRESS NOTE   Subjective/Complaints:  Labs reviewed , discussed K+ with pt, now constipated , had 2 bm yesterday wants dulc supp today   ROS: Patient denies CP, SOB, abd pain,  Cough, vision changes  Objective:   No results found. No results for input(s): "WBC", "HGB", "HCT", "PLT" in the last 72 hours.  Recent Labs    11/30/21 0509 12/01/21 0549  NA 126* 129*  K 2.8* 3.9  CL 90* 96*  CO2 26 26  GLUCOSE 105* 82  BUN 8 7*  CREATININE 0.34* 0.31*  CALCIUM 8.2* 8.1*     Intake/Output Summary (Last 24 hours) at 12/01/2021 1256 Last data filed at 12/01/2021 0802 Gross per 24 hour  Intake 597 ml  Output --  Net 597 ml      Pressure Injury 11/03/21 Coccyx Medial Stage 2 -  Partial thickness loss of dermis presenting as a shallow open injury with a red, pink wound bed without slough. (Active)  11/03/21 1556  Location: Coccyx  Location Orientation: Medial  Staging: Stage 2 -  Partial thickness loss of dermis presenting as a shallow open injury with a red, pink wound bed without slough.  Wound Description (Comments):   Present on Admission:     Physical Exam: Vital Signs Blood pressure (!) 101/53, pulse 98, temperature 98.2 F (36.8 C), temperature source Oral, resp. rate 18, height 5\' 1"  (1.549 m), weight 45.9 kg, SpO2 95 %.    General: No acute distress, in bed Mood and affect are appropriate Heart: Regular rate and rhythm no rubs murmurs or extra sounds Lungs: Clear to auscultation, breathing unlabored, no rales or wheezes Abdomen: Positive bowel sounds, soft nontender to palpation, nondistended Extremities: No clubbing, cyanosis, or edema   decannulated with gauze over stoma  Ext: no clubbing, cyanosis, or edema Psych: pleasant and cooperative   Skin: warm and dry, stage II coccyx 1cm with fibronecrotic debris in center Neurologic: Cranial nerves II through XII intact, motor strength is 4/5 in  bilateral deltoid, bicep, tricep, grip,  trace hip flexor, knee extensors,0/5  ankle dorsiflexor and trace plantar flexor Sensory exam mildly reduced sensation to LT in feet, proprioception intact--no change  Musculoskeletal: Full range of motion in UE , limited AROM in LE due to weakness  No joint swelling   Assessment/Plan: 1. Functional deficits which require 3+ hours per day of interdisciplinary therapy in a comprehensive inpatient rehab setting. Physiatrist is providing close team supervision and 24 hour management of active medical problems listed below. Physiatrist and rehab team continue to assess barriers to discharge/monitor patient progress toward functional and medical goals  Care Tool:  Bathing    Body parts bathed by patient: Right arm, Face, Left arm, Chest, Abdomen, Front perineal area, Left upper leg, Right lower leg, Left lower leg, Right upper leg   Body parts bathed by helper: Buttocks     Bathing assist Assist Level: Minimal Assistance - Patient > 75%     Upper Body Dressing/Undressing Upper body dressing   What is the patient wearing?: Pull over shirt    Upper body assist Assist Level: Minimal Assistance - Patient > 75%    Lower Body Dressing/Undressing Lower body  dressing      What is the patient wearing?: Pants, Incontinence brief     Lower body assist Assist for lower body dressing: Total Assistance - Patient < 25%     Toileting Toileting    Toileting assist Assist for toileting: Maximal Assistance - Patient 25 - 49%     Transfers Chair/bed transfer  Transfers assist  Chair/bed transfer activity did not occur: N/A  Chair/bed transfer assist level: Moderate Assistance - Patient 50 - 74%     Locomotion Ambulation   Ambulation assist   Ambulation activity did not occur: Safety/medical concerns  Assist level: Maximal Assistance - Patient 25 - 49% Assistive device: Fara Boros Max distance: 12 ft   Walk 10 feet  activity   Assist  Walk 10 feet activity did not occur: Safety/medical concerns        Walk 50 feet activity   Assist Walk 50 feet with 2 turns activity did not occur: Safety/medical concerns         Walk 150 feet activity   Assist Walk 150 feet activity did not occur: Safety/medical concerns         Walk 10 feet on uneven surface  activity   Assist Walk 10 feet on uneven surfaces activity did not occur: Safety/medical concerns         Wheelchair     Assist Is the patient using a wheelchair?: Yes Type of Wheelchair: Manual Wheelchair activity did not occur: Safety/medical concerns  Wheelchair assist level: Supervision/Verbal cueing Max wheelchair distance: 150 ft    Wheelchair 50 feet with 2 turns activity    Assist    Wheelchair 50 feet with 2 turns activity did not occur: Safety/medical concerns   Assist Level: Contact Guard/Touching assist   Wheelchair 150 feet activity     Assist  Wheelchair 150 feet activity did not occur: Safety/medical concerns   Assist Level: Supervision/Verbal cueing   Blood pressure (!) 101/53, pulse 98, temperature 98.2 F (36.8 C), temperature source Oral, resp. rate 18, height 5\' 1"  (1.549 m), weight 45.9 kg, SpO2 95 %.  Medical Problem List and Plan: 1. 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  Antithrombotics: -DVT/anticoagulation:  Pharmaceutical: Lovenox             -antiplatelet therapy: N/a 3. Pain:  Managed by Dr.Phillips. Off Methadone and tramadol- --Oxycodone 5 mg TID with tylenol prn for pain.consider transition to tramadol would avoid methadone due to pulmonary depression issues, pharmacodynamics  4. Mood: Team to provide ego support/encouragement.  LCSW to follow for evaluation and support.              -antipsychotic agents:  N/A 5. Neuropsych: This patient is capable of making decisions on her own behalf. 6. Stage 2 decubitus ulcer: Routine pressure relief measures.              --continue air mattress overlay for MASD/Stage 2 decub.   6/11 begin medihoney to wound daily 7. Fluids/Electrolytes/Nutrition: Strict I/O. Continue regular diet            Hypo K improved after supplementation, will d/c KCL recheck on  Friday ,     Latest Ref Rng & Units 12/01/2021    5:49 AM 11/30/2021    5:09 AM 11/28/2021    6:49 AM  BMP  Glucose 70 - 99 mg/dL 82  962    BUN 8 - 23 mg/dL 7  8    Creatinine 9.52 - 1.00 mg/dL 8.41  3.24    Sodium 401 - 145 mmol/L 129  126    Potassium 3.5 - 5.1 mmol/L 3.9  2.8  4.0   Chloride 98 - 111 mmol/L 96  90    CO2 22 - 32 mmol/L 26  26    Calcium 8.9 - 10.3 mg/dL 8.1  8.2    Well hydrated Repeat labs tomorrow  8. COPD w/VDRF/Trach dependent: Continue ATC 5-8 liters --followed by Dr. Delton Coombes.  --Keep saturation 88-92% per pulmonary --encourage pulmonary hygiene  --Continue Singulair, Yuperi, Brovana and Pulmicort nebs.  -6/16 Decannulated 9. Leucocytosis: Down vs peak but still elevated, afebrile ,cont to monitor     Latest Ref Rng & Units 11/26/2021    6:16 AM 11/19/2021    6:41 AM 11/15/2021   10:45 AM  CBC  WBC 4.0 - 10.5 K/uL 15.2  16.5  14.9   Hemoglobin 12.0 - 15.0 g/dL 02.7  9.9  25.3   Hematocrit 36.0 - 46.0 % 31.8  31.6  32.0   Platelets 150 - 400 K/uL 480  426  426   Trending down recheck 6/26 10. Panic attacks/Anxiety d/o: Klonopin weaned down 05/30 to  0.5 mg TID.6/16 down to .25mg  TID , D/C ativan, resume prn xanax             --Seroquel 75 mg BID. Reduced to 50mg  BID  - appreciate CCM input  11. Cardiomyopathy: Continue Metoprolol 12.5 mg bid and Crestor.              --aldactone added 06/07-->monitor for orthostatic symptoms.  No sign of failure 6/22 12. Ileus/intermittent issues w/N/V: Has  been refusing miralax and senna intermittently. Will decrease to Miralax to daily w/Senna S daily             --Keep Mg>2.0 and K>4.0 for adequate supplementation/recurrence.               --hypokalemia--resolved monitor K+ off supplements             --add Mg for supplement.    Multiple BMs after sorbitol 2 d ago but no BM yesterday , still on Reglan , no signs of ileus , may d/c, start sorbitol daily prn, has dulc supp Some increased nausea this am resume Reglan at 5mg  IV q 12 , change to q 6 prn, hope to resume oral form next week   -daily BMs  13. Pulmonary nodules: PCCM recommends repeat CT 6-8 weeks from 10/28/21 to monitor for resolution.  14. Interstitial cystitis s/p cystectomy and fistula repair: Followed by Dr.Evans.   15.  Dispo discussed need for assistance post d/c , husband reportedly undergoing chemotherapy treatment recently told he has Stage 4 , discuss in conf Wed , pt is anxious about care needs , has other family members who can assist  16. UTI  -U/A ordered, >50 wbc, bacterial, nitrates  -Keflex 250mg  q6h ordered for 5 days, await culture, +proteus 100K, s to Keflex  17. Hyponatremia, appears to be chronic, may be related to spironolactone       Latest Ref Rng & Units 12/01/2021    5:49 AM 11/30/2021  5:09 AM 11/28/2021    6:49 AM  BMP  Glucose 70 - 99 mg/dL 82  161    BUN 8 - 23 mg/dL 7  8    Creatinine 0.96 - 1.00 mg/dL 0.45  4.09    Sodium 811 - 145 mmol/L 129  126    Potassium 3.5 - 5.1 mmol/L 3.9  2.8  4.0   Chloride 98 - 111 mmol/L 96  90    CO2 22 - 32 mmol/L 26  26    Calcium 8.9 - 10.3 mg/dL 8.1  8.2     18.  Bowel regulation had ileus which resolved, , had BMs yesterday but not thus far today , on senna S 2 po BID with lincreas to 3 tabs, also may have dulc supp but would not rec sorbitol  LOS: 17 days A FACE TO FACE EVALUATION WAS PERFORMED  Erick Colace 12/01/2021, 12:56 PM

## 2021-12-02 MED ORDER — QUETIAPINE FUMARATE 25 MG PO TABS
12.5000 mg | ORAL_TABLET | Freq: Two times a day (BID) | ORAL | Status: DC
  Administered 2021-12-03 (×2): 12.5 mg via ORAL
  Filled 2021-12-02 (×3): qty 1

## 2021-12-02 MED ORDER — METOCLOPRAMIDE HCL 5 MG PO TABS
5.0000 mg | ORAL_TABLET | Freq: Four times a day (QID) | ORAL | Status: DC | PRN
  Administered 2021-12-03 – 2021-12-12 (×4): 5 mg via ORAL
  Filled 2021-12-02 (×5): qty 1

## 2021-12-03 DIAGNOSIS — E876 Hypokalemia: Secondary | ICD-10-CM

## 2021-12-03 LAB — CBC
HCT: 31.4 % — ABNORMAL LOW (ref 36.0–46.0)
Hemoglobin: 9.9 g/dL — ABNORMAL LOW (ref 12.0–15.0)
MCH: 29.5 pg (ref 26.0–34.0)
MCHC: 31.5 g/dL (ref 30.0–36.0)
MCV: 93.5 fL (ref 80.0–100.0)
Platelets: 481 10*3/uL — ABNORMAL HIGH (ref 150–400)
RBC: 3.36 MIL/uL — ABNORMAL LOW (ref 3.87–5.11)
RDW: 16.1 % — ABNORMAL HIGH (ref 11.5–15.5)
WBC: 13.5 10*3/uL — ABNORMAL HIGH (ref 4.0–10.5)
nRBC: 0 % (ref 0.0–0.2)

## 2021-12-03 LAB — BASIC METABOLIC PANEL
Anion gap: 9 (ref 5–15)
BUN: 6 mg/dL — ABNORMAL LOW (ref 8–23)
CO2: 28 mmol/L (ref 22–32)
Calcium: 8.3 mg/dL — ABNORMAL LOW (ref 8.9–10.3)
Chloride: 93 mmol/L — ABNORMAL LOW (ref 98–111)
Creatinine, Ser: 0.31 mg/dL — ABNORMAL LOW (ref 0.44–1.00)
GFR, Estimated: >60 mL/min (ref >60)
Glucose, Bld: 86 mg/dL (ref 70–99)
Potassium: 3 mmol/L — ABNORMAL LOW (ref 3.5–5.1)
Sodium: 130 mmol/L — ABNORMAL LOW (ref 135–145)

## 2021-12-03 MED ORDER — POTASSIUM CHLORIDE 20 MEQ PO PACK
40.0000 meq | PACK | ORAL | Status: AC
  Administered 2021-12-03 (×2): 40 meq via ORAL
  Filled 2021-12-03 (×2): qty 2

## 2021-12-03 NOTE — Progress Notes (Signed)
Patient refuses to wear PRAFO boot at bedtime. Patient educated.

## 2021-12-04 ENCOUNTER — Encounter (HOSPITAL_COMMUNITY): Payer: Medicare Other

## 2021-12-04 LAB — BASIC METABOLIC PANEL
Anion gap: 8 (ref 5–15)
BUN: 7 mg/dL — ABNORMAL LOW (ref 8–23)
CO2: 26 mmol/L (ref 22–32)
Calcium: 8.5 mg/dL — ABNORMAL LOW (ref 8.9–10.3)
Chloride: 98 mmol/L (ref 98–111)
Creatinine, Ser: 0.3 mg/dL — ABNORMAL LOW (ref 0.44–1.00)
GFR, Estimated: >60 mL/min (ref >60)
Glucose, Bld: 84 mg/dL (ref 70–99)
Potassium: 3.8 mmol/L (ref 3.5–5.1)
Sodium: 132 mmol/L — ABNORMAL LOW (ref 135–145)

## 2021-12-04 LAB — MAGNESIUM: Magnesium: 1.8 mg/dL (ref 1.7–2.4)

## 2021-12-04 MED ORDER — ENOXAPARIN SODIUM 30 MG/0.3ML IJ SOSY
30.0000 mg | PREFILLED_SYRINGE | INTRAMUSCULAR | Status: DC
  Administered 2021-12-05 – 2021-12-12 (×8): 30 mg via SUBCUTANEOUS
  Filled 2021-12-04 (×8): qty 0.3

## 2021-12-04 MED ORDER — SENNOSIDES-DOCUSATE SODIUM 8.6-50 MG PO TABS
2.0000 | ORAL_TABLET | Freq: Every day | ORAL | Status: DC
  Administered 2021-12-06 – 2021-12-10 (×5): 2 via ORAL
  Filled 2021-12-04 (×7): qty 2

## 2021-12-04 MED ORDER — POTASSIUM CHLORIDE CRYS ER 10 MEQ PO TBCR
10.0000 meq | EXTENDED_RELEASE_TABLET | Freq: Two times a day (BID) | ORAL | Status: DC
  Administered 2021-12-04 – 2021-12-12 (×17): 10 meq via ORAL
  Filled 2021-12-04 (×17): qty 1

## 2021-12-04 NOTE — Progress Notes (Signed)
Physical Therapy Session Note  Patient Details  Name: [name] MRN: [id number] Date of Birth: 30-Nov-1955  Today's Date: 12/04/2021 PT Individual Time: 1315-1404 PT Individual Time Calculation (min): 49 min  and Today's Date: 12/04/2021 PT Missed Time: 49 Minutes Missed Time Reason: Toileting  Short Term Goals: Week 1:  PT Short Term Goal 1 (Week 1): Pt will complete bed mobility with mod A x 1 PT Short Term Goal 1 - Progress (Week 1): Met PT Short Term Goal 2 (Week 1): Pt will complete least restrictive transfer with assist x 1 PT Short Term Goal 2 - Progress (Week 1): Met PT Short Term Goal 3 (Week 1): Pt will initiate gait training as safe and able PT Short Term Goal 3 - Progress (Week 1): Met PT Short Term Goal 4 (Week 1): Pt will initiate w/c mobility as safe and able PT Short Term Goal 4 - Progress (Week 1): Met PT Short Term Goal 5 - Progress (Week 1): Other (comment) Week 2:  PT Short Term Goal 1 (Week 2): Pt will ambulate 34f with mod assist and LRAD with +2 for WC follow PT Short Term Goal 2 (Week 2): Pt will propell  WC >151fwith supervision assist PT Short Term Goal 3 (Week 2): [facility] with LRAD PT Short Term Goal 4 (Week 2): Pt will perform 5xSTS   Skilled Therapeutic Interventions/Progress Updates:  Patient supine in bed on bedpan on entrance to room. Patient alert and agreeable to PT session.   Patient with no pain complaint throughout session.  Therapeutic Activity: Bed Mobility: Patient performed supine --> sit with distant supervision. VC required to complete forward scoot until Bil feet on floor. When returning to supine, pt requires MinA to bring BLE to bed surface.  Transfers: Patient performed sit<>stand and stand pivot transfers throughout session with ModA to power up and then is able to complete after 45 deg. Following st<>stand training from wedge cushion, pt is able to perform rise to stand with  MinA for power up. And then performs stand pivot to w/c with light MinA using RW. Provided verbal cues for technique and positioning throughout.  Gait Training:  Patient ambulated 10 ft using RW with MinA for balance. Demonstrated good LE knee extension with quad activation. Provided vc/ tc for maintaining quad activation.  Wheelchair Mobility:  Patient propelled wheelchair 300 feet with supervision. No vc required for propel. Educated on donning leg rests - will need continued practice.  Neuromuscular Re-ed: NMR facilitated during session with focus on muscle activation and standing balance/ tolerance. Pt guided in rise to stand with increased BLE activation. Pt able to perform x5 sit<>stand from medium wedge on EOM with MinA and improving to CGA. After removing wedge, pt is able to rise to stand with CGA and heavy BUE use. Once standing pt is able to perform stand pivot to sit in w/c with Min A for balance and fatigue. NMR performed for improvements in motor control and coordination, balance, sequencing, judgement, and self confidence/ efficacy in performing all aspects of mobility at highest level of independence.   Patient supine  in bed at end of session with brakes locked and all needs within reach.   Therapy Documentation Precautions:  Precautions Precautions: Fall Precaution Comments: trach stoma,  B foot drop Restrictions Weight Bearing Restrictions: No General: PT Amount of Missed Time (min): 49 Minutes PT Missed Treatment Reason: Toileting Vital Signs:  Pain:  No pain  complaint this session. Does c/o fatigue before end of session.   Therapy/Group: Individual Therapy  Alger Simons PT, DPT, CSRS 12/04/2021, 6:49 PM

## 2021-12-04 NOTE — Progress Notes (Signed)
Physical Therapy Session Note  Patient Details  Name: [name] MRN: [id number] Date of Birth: 1956-03-01  Today's Date: 12/04/2021 PT Individual Time: 0900-1000 PT Individual Time Calculation (min): 60 min   Short Term Goals: Week 1:  PT Short Term Goal 1 (Week 1): Pt will complete bed mobility with mod A x 1 PT Short Term Goal 1 - Progress (Week 1): Met PT Short Term Goal 2 (Week 1): Pt will complete least restrictive transfer with assist x 1 PT Short Term Goal 2 - Progress (Week 1): Met PT Short Term Goal 3 (Week 1): Pt will initiate gait training as safe and able PT Short Term Goal 3 - Progress (Week 1): Met PT Short Term Goal 4 (Week 1): Pt will initiate w/c mobility as safe and able PT Short Term Goal 4 - Progress (Week 1): Met PT Short Term Goal 5 - Progress (Week 1): Other (comment)  Skilled Therapeutic Interventions/Progress Updates:    pt received in bed and agreeable to therapy. No complaint of pain. Supine>sit with supervision. Donned scrub pants with assist to thread up to knees, pt then required min A to pull over hips while rolling. Pt performed min A squat pivot to w/c, noted poor clearance of buttocks, but completed transfer safely. Donned shoes. Socks, foot up brace on L foot and DF assist wrap. LPN in/out for meds pass during session. Pt transported to therapy gym for time management and energy conservation. Session focused on gait training with eva walker. Pt ambulated with mod A and eva walker with steering locked. Pt ambulates with short strides and poor foot clearance, mod improvement with cuing. Pt ambulates x 30 ft, x 46 ft, and x 30 ft with seated rest breaks. Noted pt's HR to be up to 140 bpm after last bout, but steadily declines during rest breaks. Pt returned to room and to bed with mod A squat pivot d/t fatigue and mod A to lift BLE into bed. Pt scooted to [facility] with min A. Pt was left with all needs in reach and alarm active.   Therapy  Documentation Precautions:  Precautions Precautions: Fall Precaution Comments: trach stoma,  B foot drop Restrictions Weight Bearing Restrictions: No General:       Therapy/Group: Individual Therapy  Juluis Rainier 12/04/2021, 9:57 AM

## 2021-12-05 MED ORDER — TRAMADOL HCL 50 MG PO TABS
50.0000 mg | ORAL_TABLET | Freq: Three times a day (TID) | ORAL | Status: DC
  Administered 2021-12-05 – 2021-12-12 (×28): 50 mg via ORAL
  Filled 2021-12-05 (×29): qty 1

## 2021-12-05 NOTE — Patient Instructions (Signed)
Theraputty Home Exercise Program  Complete 1-2 times a day.  putty squeeze  Pt. should squeeze putty in hand trying to keep it round by rotating putty after each squeeze. push fingers through putty to palm each time. Complete for ____1-2__ minutes in each hand.   PUTTY KEY GRIP  Hold the putty at the top of your hand. Squeeze the putty between your thumb and the side of your 2nd finger as shown. Complete for _1-2__ minutes in each hand.    PUTTY 3 JAW CHUCK  Roll up some putty into a ball then flatten it. Then, firmly squeeze it with your first 3 fingers as shown. Complete for __1-2 __ minutes in each hand.

## 2021-12-05 NOTE — Progress Notes (Signed)
Physical Therapy Session Note  Patient Details  Name: ARDIE DRAGOO MRN: [id number] Date of Birth: 11/13/1955  Today's Date: 12/05/2021 PT Individual Time: 1640-1735 PT Individual Time Calculation (min): 55 min   Short Term Goals: Week 1:  PT Short Term Goal 1 (Week 1): Pt will complete bed mobility with mod A x 1 PT Short Term Goal 1 - Progress (Week 1): Met PT Short Term Goal 2 (Week 1): Pt will complete least restrictive transfer with assist x 1 PT Short Term Goal 2 - Progress (Week 1): Met PT Short Term Goal 3 (Week 1): Pt will initiate gait training as safe and able PT Short Term Goal 3 - Progress (Week 1): Met PT Short Term Goal 4 (Week 1): Pt will initiate w/c mobility as safe and able PT Short Term Goal 4 - Progress (Week 1): Met PT Short Term Goal 5 - Progress (Week 1): Other (comment) Week 2:  PT Short Term Goal 1 (Week 2): Pt will ambulate 66f with mod assist and LRAD with +2 for WC follow PT Short Term Goal 1 - Progress (Week 2): Met PT Short Term Goal 2 (Week 2): Pt will propell  WC >1531fwith supervision assist PT Short Term Goal 2 - Progress (Week 2): Met PT Short Term Goal 3 (Week 2): [facility] consistently with LRAD PT Short Term Goal 3 - Progress (Week 2): Met PT Short Term Goal 4 (Week 2): Pt will perform 5xSTS PT Short Term Goal 4 - Progress (Week 2): Met Week 3:  PT Short Term Goal 1 (Week 3): Pt will ambulate 2075fith min assist and LRAD PT Short Term Goal 2 (Week 3): Pt will transfer to and [facility] consistently PT Short Term Goal 3 (Week 3): Pt will propell WC >150f37fthout assist PT Short Term Goal 4 (Week 3): Family education will be initiated.  Skilled Therapeutic Interventions/Progress Updates:   Pt received supine in bed and agreeable to PT. Supine>sit transfer with supervision assist from PT with cues for rail use. Transfers to and from [facility] min assist stand pivot with RW x 2 and foot up brace  in place.   Gait training with RW and Bil footup brace 2 x 12ft79fh min assist from PT. HR increased to 128 following each bout with 4 min rest break to reduce to >110bpm.   WC mobility through hall 2 x 150ft 24f supervision assist for safety. Education for [facility] management to place and remove foot rests.   Pt returned to room and performed stand pivot transfer to bed with min assist and RW as listed above. Sit>supine completed with min assist at feet, and left supine in bed with call bell in reach and all needs met.       Therapy Documentation Precautions:  Precautions Precautions: Fall Precaution Comments: trach stoma,  B foot drop Restrictions Weight Bearing Restrictions: No  Pain: denies   Therapy/Group: Individual Therapy  AustinLorie Phenix2023, 6:11 PM

## 2021-12-05 NOTE — Patient Care Conference (Signed)
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 Alysia Penna Social Worker Present: Erlene Quan, BSW Nurse Present: Dorien Chihuahua, RN PT Present: Barrie Folk, PT OT Present: Other (comment) Ailene Ravel, OT) SLP Present: Sherren Kerns, SLP PPS Coordinator present : Ileana Ladd, PT     Current Status/Progress Goal Weekly Team Focus  Bowel/Bladder   cont of B&B. last BM-6/27  remain cont.  assess q shift and PRN   Swallow/Nutrition/ Hydration             ADL's   CGA SB or lateral scoot. SBA UB dressing, bathing, and LB bathing using LHS, LB dressing Min A when using reacher and sock aid. Toileting: Mod A (lateral leans performed)  MIN A overall, mod I seated UB ADL  ADL re-training, patient education regarding self care when provided assistance versus when solo with no assist, energy conservation, BUE strength and endurance, hand strength, coordination. If no family/caregiver Ed has been scheduled it really needs to be set up if able to coordinate   Mobility   Supine-->sit with supervision, Sit-->supine with MinA;  Mod A sit<>stand requiring most assist on power up, but definitely dependent on fatigue and strength levels. Gait with w/c follow using EVA walker up to 50 feet with ModA or with RW for 10 feet with ModA for  balance and maintaining Bil LE extension. W/c mobility with supervision and requires assist with parts management.  min assist bed mobility and transfers. min-mod assist gait. mod I WC propulsion.      Communication             Safety/Cognition/ Behavioral Observations  ST no longer following; all goals met         Pain   denies pain. Pain mangment of Oxycodone q 8 hours is effective  pain<3  assess pain q shift and PRN   Skin   st 2 coccyx  proper healing, no new breakdowns  assess skin q shift and PRN     Discharge Planning:  Discharging home with assistance from sister. Sister plas to arrange [facility] to assist with supervision   Team Discussion: Patient with medical issues, low potassium, persistent fatigue, poor activity tolerance and tachycardia with activity post critical illness neuropathy with healing stage 2 on sacral area; treated with medi-honey.  Patient on target to meet rehab goals: yes, currently needs CGA - min assist for lateral scoots. Completes upper body care with SBA and lower body using a lateral lean. Requires assistance for thoroughness and adaptive equipment (reacher/sockaide). Completes transfers with min - mod assist. Goals for discharge set for supervision - min assist overall and CGA for transfers.  *See Care Plan and progress notes for long and short-term goals.   Revisions to Treatment Plan:  Patient will not meet transfer goal or ambulation goal SLP services  discontinued   Teaching Needs: Safety, medications, transfers, toileting, etc  Current Barriers to Discharge: Home enviroment access/layout and Lack of/limited family support  Possible Resolutions to Barriers: Family education HH follow up services DME: hosp. Bed, TTB, DA- BSC, RW, W/C and cushion Recommend ramp for entry to home     Medical Summary               I attest that I was present, lead the team conference, and concur with the assessment and plan of the team.   Margarito Liner 12/05/2021, 4:52 PM

## 2021-12-05 NOTE — Progress Notes (Signed)
Physical Therapy Weekly Progress Note  Patient Details  Name: [name] MRN: [id number] Date of Birth: 1955/06/12  Beginning of progress report period: November 23, 2021 End of progress report period: December 05, 2021  Today's Date: 12/05/2021 PT Individual Time: 1035-1130 PT Individual Time Calculation (min): 55 min   Patient has met 4 of 4 short term goals.  Pt is making steady progress towards LTG of min assist overall for gait and transfers. Pt has progressed to min-mod assist stand pivot transfers with RW as well as gait up to 25f with min assist and DF wrap in place. Supervision assist for WC mobility. UTI, bowel incontinence and fatigue limit increased progress at this time.   Patient continues to demonstrate the following deficits muscle weakness, muscle joint tightness, and muscle paralysis, decreased cardiorespiratoy endurance, abnormal tone, and decreased sitting balance, decreased standing balance, decreased postural control, and decreased balance strategies and therefore will continue to benefit from skilled PT intervention to increase functional independence with mobility.  Patient progressing toward long term goals..  Continue plan of care.  PT Short Term Goals Week 2:  PT Short Term Goal 1 (Week 2): Pt will ambulate 149fwith mod assist and LRAD with +2 for WC follow PT Short Term Goal 1 - Progress (Week 2): Met PT Short Term Goal 2 (Week 2): Pt will propell  WC >15057fith supervision assist PT Short Term Goal 2 - Progress (Week 2): Met PT Short Term Goal 3 (Week 2): Pt will trasnfer to and [facility] assist consistently with LRAD PT Short Term Goal 3 - Progress (Week 2): Met PT Short Term Goal 4 (Week 2): Pt will perform 5xSTS PT Short Term Goal 4 - Progress (Week 2): Met Week 3:  PT Short Term Goal 1 (Week 3): Pt will ambulate 40f72fth min assist and LRAD PT Short Term Goal 2 (Week 3): Pt will transfer to and [facility] assist consistently PT Short Term Goal  3 (Week 3): Pt will propell WC >150ft72fhout assist PT Short Term Goal 4 (Week 3): Family education will be initiated. Week 4:     Skilled Therapeutic Interventions/Progress Updates:   Pt received supine in bed and agreeable to PT. Home health representivie present performing pt assessment. Edcuation provided for potiential caregivers and family that was present on expectations of 24/7 supervision assist and need for care and assist with tranfers to and from WC/[place]Franklinprovided pt with roho cushion due to incontinent on hybrid cushion. Supine>sit transfer with supervision assist and moderate use of BUE to push from rails. Sit<>stand from EOB with RW x 3 with slightly elevated bed with CGA from PT. Stand pivot transfer [facility] and [facility] from PT with cues for improved UE placement. Sit<>stand [facility] min assist from BLE on RW and then CGA with LUE to push [facility]. Pt able to stand 10-15sec on each bout with BUE supported on RW. Pt reports mild chest tightness in standing on 3rd bout. PT performed VS assessment.  106/58, HR 99, 100%. HR check immediately following standing HR assessed at 120bpm. Pt returned to room and performed stand pivot transfer to bed with RW as listed above. Sit>supine completed with min assist at BLE and left supine in bed with call bell in reach and all needs met.        Therapy Documentation Precautions:  Precautions Precautions: Fall Precaution Comments: trach stoma,  B foot drop  Restrictions Weight Bearing Restrictions: No   Pain: denies   Therapy/Group: Individual Therapy  Lorie Phenix 12/05/2021, 1:00 PM

## 2021-12-05 NOTE — Progress Notes (Signed)
PROGRESS NOTE   Subjective/Complaints:  No issues overnite , discussed pain meds for CLBP  ROS: Patient denies fever, CP, SOB, abd pain,  Cough, vision changes  Objective:   No results found. Recent Labs    12/03/21 0605  WBC 13.5*  HGB 9.9*  HCT 31.4*  PLT 481*    Recent Labs    12/03/21 0644 12/04/21 0638  NA 130* 132*  K 3.0* 3.8  CL 93* 98  CO2 28 26  GLUCOSE 86 84  BUN 6* 7*  CREATININE 0.31* 0.30*  CALCIUM 8.3* 8.5*     Intake/Output Summary (Last 24 hours) at 12/05/2021 2633 Last data filed at 12/04/2021 1843 Gross per 24 hour  Intake 474 ml  Output --  Net 474 ml      Pressure Injury 11/03/21 Coccyx Medial Stage 2 -  Partial thickness loss of dermis presenting as a shallow open injury with a red, pink wound bed without slough. (Active)  11/03/21 1556  Location: Coccyx  Location Orientation: Medial  Staging: Stage 2 -  Partial thickness loss of dermis presenting as a shallow open injury with a red, pink wound bed without slough.  Wound Description (Comments):   Present on Admission:     Physical Exam: Vital Signs Blood pressure 103/61, pulse (!) 108, temperature 98.3 F (36.8 C), resp. rate 19, height 5\' 1"  (1.549 m), weight 45.9 kg, SpO2 93 %.    General: No acute distress, in bed Mood and affect are appropriate Heart: Regular rate and rhythm no rubs murmurs or extra sounds, no JVD Lungs: Clear to auscultation, breathing unlabored, no rales or wheezes Abdomen: Positive bowel sounds, soft nontender to palpation, nondistended Extremities: No clubbing, cyanosis, or edema   decannulated with gauze over stoma  Ext: no clubbing, cyanosis, or edema Psych: pleasant and cooperative   Skin: warm and dry, stage II coccyx 1cm with fibronecrotic debris in center Neurologic: Cranial nerves II through XII intact, motor strength is 4/5 in bilateral deltoid, bicep, tricep, grip,  trace hip flexor,  knee extensors,0/5  ankle dorsiflexor and trace plantar flexor Sensory exam mildly reduced sensation to LT in feet, proprioception intact--no change  Musculoskeletal: Full range of motion in UE , limited AROM in LE due to weakness  No joint swelling   Assessment/Plan: 1. Functional deficits which require 3+ hours per day of interdisciplinary therapy in a comprehensive inpatient rehab setting. Physiatrist is providing close team supervision and 24 hour management of active medical problems listed below. Physiatrist and rehab team continue to assess barriers to discharge/monitor patient progress toward functional and medical goals  Care Tool:  Bathing    Body parts bathed by patient: Right arm, Left arm, Chest, Abdomen, Front perineal area, Buttocks, Right upper leg, Left upper leg, Right lower leg, Left lower leg, Face   Body parts bathed by helper: Buttocks     Bathing assist Assist Level: Supervision/Verbal cueing     Upper Body Dressing/Undressing Upper body dressing   What is the patient wearing?: Pull over shirt    Upper body assist Assist Level: Supervision/Verbal cueing    Lower Body Dressing/Undressing Lower body dressing      What is the  patient wearing?: Incontinence brief, Pants     Lower body assist Assist for lower body dressing: Minimal Assistance - Patient > 75%     Toileting Toileting    Toileting assist Assist for toileting: Maximal Assistance - Patient 25 - 49%     Transfers Chair/bed transfer  Transfers assist  Chair/bed transfer activity did not occur: N/A  Chair/bed transfer assist level: Contact Guard/Touching assist     Locomotion Ambulation   Ambulation assist   Ambulation activity did not occur: Safety/medical concerns  Assist level: Maximal Assistance - Patient 25 - 49% Assistive device: Walker-Eva Max distance: 12 ft   Walk 10 feet activity   Assist  Walk 10 feet activity did not occur: Safety/medical concerns         Walk 50 feet activity   Assist Walk 50 feet with 2 turns activity did not occur: Safety/medical concerns         Walk 150 feet activity   Assist Walk 150 feet activity did not occur: Safety/medical concerns         Walk 10 feet on uneven surface  activity   Assist Walk 10 feet on uneven surfaces activity did not occur: Safety/medical concerns         Wheelchair     Assist Is the patient using a wheelchair?: Yes Type of Wheelchair: Manual Wheelchair activity did not occur: Safety/medical concerns  Wheelchair assist level: Supervision/Verbal cueing Max wheelchair distance: 150 ft    Wheelchair 50 feet with 2 turns activity    Assist    Wheelchair 50 feet with 2 turns activity did not occur: Safety/medical concerns   Assist Level: Contact Guard/Touching assist   Wheelchair 150 feet activity     Assist  Wheelchair 150 feet activity did not occur: Safety/medical concerns   Assist Level: Supervision/Verbal cueing   Blood pressure 103/61, pulse (!) 108, temperature 98.3 F (36.8 C), resp. rate 19, height 5\' 1"  (1.549 m), weight 45.9 kg, SpO2 93 %.  Medical Problem List and Plan: 1. 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  Antithrombotics: -DVT/anticoagulation:  Pharmaceutical: Lovenox             -antiplatelet therapy: N/a 3. Pain:  Managed by Dr.Phillips. Off Methadone and tramadol- --Oxycodone 5 mg TID with tylenol prn for pain. transition to tramadol would avoid methadone due to pulmonary depression issues, pharmacodynamics  4. Mood: Team to provide ego support/encouragement. LCSW to follow for evaluation and support.              -antipsychotic agents:  N/A 5. Neuropsych: This patient is capable of making decisions on her own behalf. 6.  Stage 2 decubitus ulcer: Routine pressure relief measures.              --continue air mattress overlay for MASD/Stage 2 decub.   6/11 begin medihoney to wound daily 7. Fluids/Electrolytes/Nutrition: Strict I/O. Continue regular diet            Hypo K improved after supplementation, will d/c KCL recheck on Friday ,     Latest Ref Rng & Units 12/04/2021    6:38 AM 12/03/2021    6:44 AM 12/01/2021    5:49  AM  BMP  Glucose 70 - 99 mg/dL 84  86  82   BUN 8 - 23 mg/dL 7  6  7    Creatinine 0.44 - 1.00 mg/dL  1.63  8.46   Sodium 135 - 145 mmol/L 132  130  129   Potassium 3.5 - 5.1 mmol/L 3.8  3.0  3.9   Chloride 98 - 111 mmol/L 98  93  96   CO2 22 - 32 mmol/L 26  28  26    Calcium 8.9 - 10.3 mg/dL 8.5  8.3  8.1   Needs low dose KCL daily supplement  8. COPD  --Continue Singulair, Yuperi, Brovana and Pulmicort nebs.  -6/16 Decannulated 9. Leucocytosis: trending down    Latest Ref Rng & Units 12/03/2021    6:05 AM 11/26/2021    6:16 AM 11/19/2021    6:41 AM  CBC  WBC 4.0 - 10.5 K/uL 13.5  15.2  16.5   Hemoglobin 12.0 - 15.0 g/dL 9.9  11/28/2021  9.9   Hematocrit 36.0 - 46.0 % 31.4  31.8  31.6   Platelets 150 - 400 K/uL 481  480  426   Trending down  6/26-continues to trend down 10. Panic attacks/Anxiety d/o: Klonopin weaned down 05/30 to  0.5 mg TID.6/16 down to .25mg  TID , D/C ativan, resume prn xanax             --Seroquel 75 mg BID. Reduced to 50mg  BID  - appreciate CCM input  11. Cardiomyopathy: Continue Metoprolol 12.5 mg bid and Crestor.              --aldactone added 06/07-->monitor for orthostatic symptoms.  No sign of failure 6/25 12. Ileus/intermittent issues w/N/V: Has been refusing miralax and senna intermittently. Will decrease to Miralax to daily w/Senna S daily             --Keep Mg>2.0 and K>4.0 for adequate supplementation/recurrence.          K+ improved still requiring supplements              --add Mg for supplement.    13. Pulmonary nodules: PCCM recommends repeat CT  6-8 weeks from 10/28/21 to monitor for resolution.  14. Interstitial cystitis s/p cystectomy and fistula repair: Followed by Dr.Evans.   15.  Dispo discussed need for assistance post d/c , husband reportedly undergoing chemotherapy treatment recently told he has Stage 4 , discuss in conf Wed , pt is anxious about care needs , has other family members who can assist  16. UTI  -U/A ordered, >50 wbc, bacterial, nitrates  -Keflex 250mg  q6h ordered for 5 days, await culture, +proteus 100K, s to Keflex  17. Hyponatremia, appears to be chronic, may be related to spironolactone       Latest Ref Rng & Units 12/04/2021    6:38 AM 12/03/2021    6:44 AM 12/01/2021    5:49 AM  BMP  Glucose 70 - 99 mg/dL 84  86  82   BUN 8 - 23 mg/dL 7  6  7    Creatinine 0.44 - 1.00 mg/dL [birth year]1977   12/06/2021   Sodium 135 - 145 mmol/L 132  130  129   Potassium 3.5 - 5.1 mmol/L 3.8  3.0  3.9   Chloride 98 - 111 mmol/L 98  93  96   CO2 22 - 32 mmol/L 26  28  26    Calcium 8.9 - 10.3 mg/dL 8.5  8.3  8.1  6/27-improved slightly to Na 132  18.  Bowel regulation had ileus which resolved, , had BMs yesterday but not thus far today , on senna S 3 BID will reduce to 2 tabs,  -BM on6/27, 6/26 and 6/25, improved  LOS: 21 days A FACE TO FACE EVALUATION WAS PERFORMED  Charlett Blake 12/05/2021, 9:23 AM

## 2021-12-05 NOTE — Progress Notes (Signed)
Recreational Therapy Session Note  Patient Details  Name: [name] MRN: [id number] Date of Birth: [birth year] Today's Date: 12/05/2021  Pain: no c/o Skilled Therapeutic Interventions/Progress Updates:  Goal:  Discuss activity analysis/modifications for her specific leisure interest   MET  Pt in bed upon arrival.  Session focused on pt education with continued emphasis on activity analysis and identification of modifications based on her specific leisure interests.  Discussed what domains of wellness were impacted by her leisure interests and how she could address those areas in a modified task or a totally new activity.  Discussed the importance of emotional health and it's impact on continued rehab and recovery.  Pt stated understanding and appreciation for this visit.  Therapy/Group: Individual Therapy  Ayaan Ringle 12/05/2021, 3:21 PM

## 2021-12-05 NOTE — Progress Notes (Signed)
Occupational Therapy Session Note  Patient Details  Name: [name] MRN: [id number] Date of Birth: 1956-01-15  Today's Date: 12/05/2021 Session 1: OT Individual Time: 0900-1000 OT Individual Time Calculation (min): 60 min  Session 2:  OT Individual Time: 1500-1530 OT Individual Time Calculation (min): 30 min   Short Term Goals: Week 3:  OT Short Term Goal 1 (Week 3): STG = LTG 2/2 ELOS  Skilled Therapeutic Interventions/Progress Updates:    S: Patient agreeable to participate in OT session. Reports 0/10 pain level.   Patient participated in skilled OT session focusing on ADL re-training and activity tolerance. Therapist facilitated session with patient completing bathing in shower while seated to assist with energy conservation and promote safety due to overall weakness. Pt completed all functional transfers such as bed to w/c, w/c to shower chair, and back to w/c with CGA. Pt was able to complete UB bathing with SBA using LHS, LB bathing with SBA using LHS. Pt was able to complete UB dressing with SBA. Pt requested to complete LB dressing in bed to allow her to assist with rolling right and left to pull over hips. Pt incontinent of urine while seated in chair and required total assist for hygiene.   Family education scheduled for Friday. 6/30.   Session 2: S: Patient agreeable to participate in OT session. Reports 0/10 pain level.    Patient participated in skilled OT session focusing on bilateral hand strength utilizing yellow theraputty while seated on EOB. Therapist educated patient on bilateral hand strengthening HEP utilizing yellow theraputty to complete grip, lateral pinch, and 3 point pinch. Pt provided with verbal instructions, visual demonstration, tactile cues, and handout. Pt verbalized understanding and returned demonstration. While seated on EOB, small blue wedge used under feet to assist with proper seated positioning as well as provide gentle static calf stretch in  order to increase ability to maintain appropriate standing posture when transferring and participating in self care tasks. Pt was able to adjust amount of stretch placed on calves independently. Bilateral grip strength task completed using handgripper. With gripper set at 6.6#, patient picked up and transferred 6 large wooden beads. Completed activity with right hand first then left hand. Pt reports greater difficulty noted with left hand due to weakness.    Therapy Documentation Precautions:  Precautions Precautions: Fall Precaution Comments: trach stoma,  B foot drop Restrictions Weight Bearing Restrictions: No   Therapy/Group: Individual Therapy  Limmie Patricia, OTR/L,CBIS  Supplemental OT - [facility]  12/05/2021, 7:49 AM

## 2021-12-06 ENCOUNTER — Encounter (HOSPITAL_COMMUNITY): Payer: Medicare Other

## 2021-12-06 DIAGNOSIS — J449 Chronic obstructive pulmonary disease, unspecified: Secondary | ICD-10-CM | POA: Diagnosis present

## 2021-12-06 LAB — POTASSIUM: Potassium: 3.6 mmol/L (ref 3.5–5.1)

## 2021-12-06 NOTE — Progress Notes (Signed)
Orthopedic Tech Progress Note Patient Details:  [name] [birth year] [id number]  Patient ID: [name], female   DOB: 03-21-1956, 66 y.o.   MRN: [id number] Called order into [facility] 12/06/2021, 9:15 AM

## 2021-12-06 NOTE — Progress Notes (Signed)
Occupational Therapy Session Note  Patient Details  Name: [name] MRN: [id number] Date of Birth: 08/03/1955  Today's Date: 12/06/2021 Session 1: OT Individual Time: 1130-1200 OT Individual Time Calculation (min): 30 min   Session 2: OT Individual Time: 1445-1530 OT Individual Time Calculation (min): 45 min   Short Term Goals: Week 3:  OT Short Term Goal 1 (Week 3): STG = LTG 2/2 ELOS  Skilled Therapeutic Interventions/Progress Updates:  Session 1:   S: Patient agreeable to participate in OT session. Reports 0/10 pain level.    Patient participated in skilled OT session focusing on bilateral shoulder stability, motor coordination, and fine motor coordination while participating in wooden Gruver game seated on EOB. While seated on EOB, small blue wedge used under feet to assist with proper seated positioning as well as provide gentle static calf stretch in order to increase ability to maintain appropriate standing posture when transferring and participating in self care tasks. Pt was able to adjust amount of stretch placed on calves independently. Therapist educated patient on techniques to monitor and adjust scapular stability while focusing on slow and controlled movements. Patient demonstrates bilateral hand tremors which caused moderate difficulty with control of BUE.   Session 2: S: Patient agreeable to participate in OT session. Reports 0/10 pain level.    Patient participated in skilled OT session focusing on Functional transfers using RW from bed to new personal w/c and bilateral shoulder stability, motor coordination and fine motor coordination. Pt utilized RW to complete a stand pivot transfer with Min Assist from bed to w/c with therapist providing VC for form and technique. Therapist integrated focusing on core strength/stability and UB stability during table top activity. Due to stable table surface, patient was able to utilize the table as needed to assist with UB  stability which she is educated to utilize at home to increase functional performance during self care tasks.      Therapy Documentation Precautions:  Precautions Precautions: Fall Precaution Comments: trach stoma,  B foot drop Restrictions Weight Bearing Restrictions: No   Therapy/Group: Individual Therapy  Limmie Patricia, OTR/L,CBIS  Supplemental OT - [facility]  12/06/2021, 11:39 AM

## 2021-12-06 NOTE — Progress Notes (Signed)
Physical Therapy Session Note  Patient Details  Name: [name] MRN: [id number] Date of Birth: [birth year]  Today's Date: 12/06/2021 PT Individual Time: 1300-1330 PT Individual Time Calculation (min): 30 min   Short Term Goals: Week 3:  PT Short Term Goal 1 (Week 3): Pt will ambulate 23ft with min assist and LRAD PT Short Term Goal 2 (Week 3): Pt will transfer to and from [facility] with min assist consistently PT Short Term Goal 3 (Week 3): Pt will propell WC >158ft without assist PT Short Term Goal 4 (Week 3): Family education will be initiated.  Skilled Therapeutic Interventions/Progress Updates:    Patient in supine and reports waiting on nursing to help change soiled brief.  Declined up to toilet as reports she is incontinent.  Assisted to change brief in supine with total A and completed perineal hygiene.  Patient requesting to perform ROM and strengthening exercises in bed.  Patient performed ankle PF with red t-band x 10 then passive stretch to each ankle 3 x 30 sec hold, then ankle DF x 10 each.  Performed hip adductor squeezes x 10 w/ 5 sec hold.  UE therex horizontal abduction x 10 with re t-band, bicep curls and tricep extensions x 10 each with red t-band.  Left in supine call bell/needs in reach, and bed alarm active.  Therapy Documentation Precautions:  Precautions Precautions: Fall Precaution Comments: trach stoma,  B foot drop Restrictions Weight Bearing Restrictions: No  Pain: Pain Assessment Pain Score: 0-No pain    Therapy/Group: Individual Therapy  Elray Mcgregor Sheran Lawless, PT 12/06/2021, 12:54 PM

## 2021-12-06 NOTE — Progress Notes (Signed)
Physical Therapy Session Note  Patient Details  Name: [name] MRN: [id number] Date of Birth: 1955/10/21  Today's Date: 12/06/2021 PT Individual Time: 1005-1105 and 1350-1430 PT Individual Time Calculation (min): 60 min and 40 min   Short Term Goals: Week 1:  PT Short Term Goal 1 (Week 1): Pt will complete bed mobility with mod A x 1 PT Short Term Goal 1 - Progress (Week 1): Met PT Short Term Goal 2 (Week 1): Pt will complete least restrictive transfer with assist x 1 PT Short Term Goal 2 - Progress (Week 1): Met PT Short Term Goal 3 (Week 1): Pt will initiate gait training as safe and able PT Short Term Goal 3 - Progress (Week 1): Met PT Short Term Goal 4 (Week 1): Pt will initiate w/c mobility as safe and able PT Short Term Goal 4 - Progress (Week 1): Met PT Short Term Goal 5 - Progress (Week 1): Other (comment) Week 2:  PT Short Term Goal 1 (Week 2): Pt will ambulate 26f with mod assist and LRAD with +2 for WC follow PT Short Term Goal 1 - Progress (Week 2): Met PT Short Term Goal 2 (Week 2): Pt will propell  WC >1584fwith supervision assist PT Short Term Goal 2 - Progress (Week 2): Met PT Short Term Goal 3 (Week 2): [facility] consistently with LRAD PT Short Term Goal 3 - Progress (Week 2): Met PT Short Term Goal 4 (Week 2): Pt will perform 5xSTS PT Short Term Goal 4 - Progress (Week 2): Met Week 3:  PT Short Term Goal 1 (Week 3): Pt will ambulate 2018fith min assist and LRAD PT Short Term Goal 2 (Week 3): Pt will [facility] and [facility] consistently PT Short Term Goal 3 (Week 3): Pt will propell WC >150f29fthout assist PT Short Term Goal 4 (Week 3): Family education will be initiated. Week 4:     Skilled Therapeutic Interventions/Progress Updates:  Session 1  Pt received supine in bed and agreeable to PT. Supine>sit transfer with supervision assist and cues for BLE position once EOB for safety. Donning shoes sitting  EOB. With max assist from PT for clothing management.   Stand pivot tranfers performed with RW and min assist from PT from elevated bed height x 2 through session with cues for step width and posture.   WC mobility through hall x 180ft1fh supervision assist. Pt able to perform leg rest management with cues for technique and position .   Gait training in parallel bars and with RW with various orthotics in parallel bars with posterior support 2 x 5 ft forward/reverse with 1 anterior support 2 x 5ft b62f Performed with lateral and medial upright with anterior support x 12ft a91fhen with BLE anterior support on lateral upright. Pt noted to have improved midfoot/heel contact with lateral support vs  medial support.   Pt returned to room and performed stand pivot transfer to bed with min Assist, RW and BAFO . Sit>supine completed with min assist at feet and left supine in bed with call bell in reach and all needs met.    Session 2.   Pt received supine in bed and agreeable to PT at bed level, reporting that pt had just finished PT treatment. PT performed supine BLE PROM into DF and HS stretch 2 x 1 min each Bil. Supine therex instructed by PT. SAQ AROM, hip abduction from  clam shell level 2 tband, hip flexion AAROM, hip abduction with straight leg AROM, bridge with BLE feet blocked, each performed x 10 BLE with therapeutic rest break between bouts. Pt left supine in bed with call bell in reach and all needs met.        Therapy Documentation Precautions:  Precautions Precautions: Fall Precaution Comments: trach stoma,  B foot drop Restrictions Weight Bearing Restrictions: No    Vital Signs: Oxygen Therapy SpO2: 93 % O2 Device: Room Air Pain:  denies   Therapy/Group: Individual Therapy  Lorie Phenix 12/06/2021, 11:08 AM

## 2021-12-06 NOTE — Progress Notes (Signed)
PROGRESS NOTE   Subjective/Complaints:    ROS: Patient denies fever, CP, SOB, abd pain,  Cough, vision changes  Objective:   No results found. No results for input(s): "WBC", "HGB", "HCT", "PLT" in the last 72 hours.  Recent Labs    12/04/21 0638 12/06/21 0510  NA 132*  --   K 3.8 3.6  CL 98  --   CO2 26  --   GLUCOSE 84  --   BUN 7*  --   CREATININE 0.30*  --   CALCIUM 8.5*  --      Intake/Output Summary (Last 24 hours) at 12/06/2021 0801 Last data filed at 12/06/2021 [id number] Gross per 24 hour  Intake 696 ml  Output --  Net 696 ml      Pressure Injury 11/03/21 Coccyx Medial Stage 2 -  Partial thickness loss of dermis presenting as a shallow open injury with a red, pink wound bed without slough. (Active)  11/03/21 1556  Location: Coccyx  Location Orientation: Medial  Staging: Stage 2 -  Partial thickness loss of dermis presenting as a shallow open injury with a red, pink wound bed without slough.  Wound Description (Comments):   Present on Admission:     Physical Exam: Vital Signs Blood pressure (!) 100/59, pulse 91, temperature 98.1 F (36.7 C), resp. rate 18, height 5\' 1"  (1.549 m), weight 45.9 kg, SpO2 93 %.    General: No acute distress, in bed Mood and affect are appropriate Heart: Regular rate and rhythm no rubs murmurs or extra sounds, no JVD Lungs: Clear to auscultation, breathing unlabored, no rales or wheezes Abdomen: Positive bowel sounds, soft nontender to palpation, nondistended Extremities: No clubbing, cyanosis, or edema   decannulated with gauze over stoma  Ext: no clubbing, cyanosis, or edema Psych: pleasant and cooperative   Skin: warm and dry, stage II coccyx 1cm with fibronecrotic debris in center Neurologic: Cranial nerves II through XII intact, motor strength is 4/5 in bilateral deltoid, bicep, tricep, grip,  trace hip flexor, knee extensors,0/5  ankle dorsiflexor and trace  plantar flexor Sensory exam mildly reduced sensation to LT in feet, proprioception intact--no change  Musculoskeletal: Full range of motion in UE , limited AROM in LE due to weakness  No joint swelling   Assessment/Plan: 1. Functional deficits which require 3+ hours per day of interdisciplinary therapy in a comprehensive inpatient rehab setting. Physiatrist is providing close team supervision and 24 hour management of active medical problems listed below. Physiatrist and rehab team continue to assess barriers to discharge/monitor patient progress toward functional and medical goals  Care Tool:  Bathing    Body parts bathed by patient: Right arm, Chest, Left arm, Abdomen, Right upper leg, Left upper leg, Right lower leg, Left lower leg, Face, Buttocks, Front perineal area   Body parts bathed by helper: Buttocks     Bathing assist Assist Level: Supervision/Verbal cueing     Upper Body Dressing/Undressing Upper body dressing   What is the patient wearing?: Pull over shirt    Upper body assist Assist Level: Supervision/Verbal cueing    Lower Body Dressing/Undressing Lower body dressing      What is the patient wearing?:  Incontinence brief, Pants     Lower body assist Assist for lower body dressing: Minimal Assistance - Patient > 75%     Toileting Toileting    Toileting assist Assist for toileting: Maximal Assistance - Patient 25 - 49%     Transfers Chair/bed transfer  Transfers assist  Chair/bed transfer activity did not occur: N/A  Chair/bed transfer assist level: Contact Guard/Touching assist     Locomotion Ambulation   Ambulation assist   Ambulation activity did not occur: Safety/medical concerns  Assist level: Maximal Assistance - Patient 25 - 49% Assistive device: Walker-Eva Max distance: 12 ft   Walk 10 feet activity   Assist  Walk 10 feet activity did not occur: Safety/medical concerns        Walk 50 feet activity   Assist Walk 50 feet  with 2 turns activity did not occur: Safety/medical concerns         Walk 150 feet activity   Assist Walk 150 feet activity did not occur: Safety/medical concerns         Walk 10 feet on uneven surface  activity   Assist Walk 10 feet on uneven surfaces activity did not occur: Safety/medical concerns         Wheelchair     Assist Is the patient using a wheelchair?: Yes Type of Wheelchair: Manual Wheelchair activity did not occur: Safety/medical concerns  Wheelchair assist level: Supervision/Verbal cueing Max wheelchair distance: 150 ft    Wheelchair 50 feet with 2 turns activity    Assist    Wheelchair 50 feet with 2 turns activity did not occur: Safety/medical concerns   Assist Level: Contact Guard/Touching assist   Wheelchair 150 feet activity     Assist  Wheelchair 150 feet activity did not occur: Safety/medical concerns   Assist Level: Supervision/Verbal cueing   Blood pressure (!) 100/59, pulse 91, temperature 98.1 F (36.7 C), resp. rate 18, height 5\' 1"  (1.549 m), weight 45.9 kg, SpO2 93 %.  Medical Problem List and Plan: 1. 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  Antithrombotics: -DVT/anticoagulation:  Pharmaceutical: Lovenox             -antiplatelet therapy: N/a 3. Pain:  Managed by Dr.Phillips. Off Methadone and tramadol- --Oxycodone 5 mg TID with tylenol prn for pain. transition to tramadol would avoid methadone due to pulmonary depression issues, pharmacodynamics  4. Mood: Team to provide ego support/encouragement. LCSW to follow for evaluation and support.              -antipsychotic agents:  N/A 5. Neuropsych: This patient is capable of making decisions on her own behalf. 6. Stage 2 decubitus ulcer: Routine pressure relief  measures.              --continue air mattress overlay for MASD/Stage 2 decub.   6/11 begin medihoney to wound daily 7. Fluids/Electrolytes/Nutrition: Strict I/O. Continue regular diet            Hypo K improved after supplementation, will d/c KCL recheck on Friday ,     Latest Ref Rng & Units 12/06/2021    5:10 AM 12/04/2021    6:38 AM 12/03/2021    6:44  AM  BMP  Glucose 70 - 99 mg/dL  84  86   BUN 8 - 23 mg/dL  7  6   Creatinine 0.44 - 1.00 mg/dL  0.30  0.31   Sodium 135 - 145 mmol/L  132  130   Potassium 3.5 - 5.1 mmol/L 3.6  3.8  3.0   Chloride 98 - 111 mmol/L  98  93   CO2 22 - 32 mmol/L  26  28   Calcium 8.9 - 10.3 mg/dL  8.5  8.3   Needs low dose KCL daily supplement  8. COPD  --Continue Singulair, Yuperi, Brovana and Pulmicort nebs.  -6/16 Decannulated 9. Leucocytosis: trending down    Latest Ref Rng & Units 12/03/2021    6:05 AM 11/26/2021    6:16 AM 11/19/2021    6:41 AM  CBC  WBC 4.0 - 10.5 K/uL 13.5  15.2  16.5   Hemoglobin 12.0 - 15.0 g/dL 9.9  10.4  9.9   Hematocrit 36.0 - 46.0 % 31.4  31.8  31.6   Platelets 150 - 400 K/uL 481  480  426   Trending down  6/26-continues to trend down 10. Panic attacks/Anxiety d/o: Klonopin weaned down 05/30 to  0.5 mg TID.6/16 down to .25mg  TID , D/C ativan, resume prn xanax             --Seroquel 75 mg BID. Reduced to 50mg  BID  - appreciate CCM input  11. Cardiomyopathy: Continue Metoprolol 12.5 mg bid and Crestor.              --aldactone added 06/07-->monitor for orthostatic symptoms.  No sign of failure 6/25 12. Ileus/intermittent issues w/N/V: Has been refusing miralax and senna intermittently. Will decrease to Miralax to daily w/Senna S daily             --Keep Mg>2.0 and K>4.0 for adequate supplementation/recurrence.          K+ improved still requiring supplements, wnl 6/29              --add Mg for supplement.    13. Pulmonary nodules: PCCM recommends repeat CT 6-8 weeks from 10/28/21 to monitor for resolution.  14.  Interstitial cystitis s/p cystectomy and fistula repair: Followed by Dr.Evans.   14.  Dispo discussed need for assistance post d/c , husband reportedly undergoing chemotherapy treatment recently told he has Stage 4 , discuss in conf Wed , pt is anxious about care needs , has other family members who can assist  51. UTI  -U/A ordered, >50 wbc, bacterial, nitrates  -Keflex 250mg  q6h ordered for 5 days, await culture, +proteus 100K, s to Keflex  17. Hyponatremia, appears to be chronic, may be related to spironolactone       Latest Ref Rng & Units 12/06/2021    5:10 AM 12/04/2021    6:38 AM 12/03/2021    6:44 AM  BMP  Glucose 70 - 99 mg/dL  84  86   BUN 8 - 23 mg/dL  7  6   Creatinine 0.44 - 1.00 mg/dL  0.30  0.31   Sodium 135 - 145 mmol/L  132  130   Potassium 3.5 - 5.1 mmol/L 3.6  3.8  3.0   Chloride 98 - 111 mmol/L  98  93   CO2 22 - 32 mmol/L  26  28   Calcium 8.9 - 10.3 mg/dL  8.5  8.3   6/27-improved slightly to Na 132  18.  Bowel regulation had ileus  which resolved, , had BMs yesterday but not thus far today , on senna S 3 BID will reduce to 2 tabs,  -BM on6/27, 6/26 and 6/25, improved  LOS: 22 days A FACE TO FACE EVALUATION WAS PERFORMED  [name] 12/06/2021, 8:01 AM

## 2021-12-07 NOTE — Progress Notes (Signed)
Physical Therapy Session Note  Patient Details  Name: [name] MRN: [id number] Date of Birth: 08-13-1955  Today's Date: 12/07/2021 PT Individual Time: 1100-1153 and 1330-1420 PT Individual Time Calculation (min): 53 min 50 min   Short Term Goals: Week 1:  PT Short Term Goal 1 (Week 1): Pt will complete bed mobility with mod A x 1 PT Short Term Goal 1 - Progress (Week 1): Met PT Short Term Goal 2 (Week 1): Pt will complete least restrictive transfer with assist x 1 PT Short Term Goal 2 - Progress (Week 1): Met PT Short Term Goal 3 (Week 1): Pt will initiate gait training as safe and able PT Short Term Goal 3 - Progress (Week 1): Met PT Short Term Goal 4 (Week 1): Pt will initiate w/c mobility as safe and able PT Short Term Goal 4 - Progress (Week 1): Met PT Short Term Goal 5 - Progress (Week 1): Other (comment) Week 2:  PT Short Term Goal 1 (Week 2): Pt will ambulate 58f with mod assist and LRAD with +2 for WC follow PT Short Term Goal 1 - Progress (Week 2): Met PT Short Term Goal 2 (Week 2): Pt will propell  WC >1593fwith supervision assist PT Short Term Goal 2 - Progress (Week 2): Met PT Short Term Goal 3 (Week 2): [facility] consistently with LRAD PT Short Term Goal 3 - Progress (Week 2): Met PT Short Term Goal 4 (Week 2): Pt will perform 5xSTS PT Short Term Goal 4 - Progress (Week 2): Met Week 3:  PT Short Term Goal 1 (Week 3): Pt will ambulate 2072fith min assist and LRAD PT Short Term Goal 2 (Week 3): Pt will transfer to and [facility] consistently PT Short Term Goal 3 (Week 3): Pt will propell WC >150f28fthout assist PT Short Term Goal 4 (Week 3): Family education will be initiated. Week 4:     Skilled Therapeutic Interventions/Progress Updates:   Session 1.   Pt received supine in bed and agreeable to PT. Supine>sit transfer with supervision assist and cues use of rails. Pt reports feeling lightheaded sitting  EOB.  Orthostatic VS assessed by PT. Sitting 121/67, HR 118. Standing 107/73, HR 127. Sitting 114/98, HR 127, 2 min resting in WC HR returned to 115. PT assisted pt to don ted hose. No additional reports of dizziness throughout session   Family education for donning AFO, car transfers with min assist into car and mod assist to stand from low height of honda accord. Gait training with RW and Bil AFO x 10ft26fh cues for heel contact. Pt returned to room and performed stand pivot transfer to bed with RW and BAFO. Sit>supine completed with min assist at BLE, and left supine in bed with call bell in reach and all needs met.    Session 2.   Pt received supine in bed and agreeable to PT. Supine>sit transfer with supervision assist from PT for safety.    Transfers to and from bed with min assist for safety, RW and BAFO. Gait training with RW and AFO and orthotist present 4 x 10ft 74f trulife anterior support AFO x 2 and thuasane ridged AFO anteior support x 2 with heel wedge on second bout. Pt reports pain in the shin with thausane brace. .   WC mobility through hall x 180ft w74fmin cues for avoidance of the rail on the R side.  Pt returned to room and performed stand pivot transfer to bed with AFO, RW nd min assist as listed. Sit>supine completed with min assist on BLE, and left supine in bed with call bell in reach and all needs met.       Therapy Documentation Precautions:  Precautions Precautions: Fall Precaution Comments: trach stoma,  B foot drop Restrictions Weight Bearing Restrictions: No General:   Missed time: 10 minute and 7 minutes due to fatigue.  Vital Signs: Therapy Vitals Temp: 98.1 F (36.7 C) Pulse Rate: (!) 105 Resp: 16 BP: 119/66 Oxygen Therapy SpO2: 94 % Pain: denies    Therapy/Group: Individual Therapy  Lorie Phenix 12/07/2021, 10:34 PM

## 2021-12-07 NOTE — Progress Notes (Signed)
Physical Therapy Session Note  Patient Details  Name: [name] MRN: [id number] Date of Birth: March 31, 1956  Today's Date: 12/07/2021 PT Individual Time: 6950-7225 PT Individual Time Calculation (min): 30 min   Short Term Goals: Week 1:  PT Short Term Goal 1 (Week 1): Pt will complete bed mobility with mod A x 1 PT Short Term Goal 1 - Progress (Week 1): Met PT Short Term Goal 2 (Week 1): Pt will complete least restrictive transfer with assist x 1 PT Short Term Goal 2 - Progress (Week 1): Met PT Short Term Goal 3 (Week 1): Pt will initiate gait training as safe and able PT Short Term Goal 3 - Progress (Week 1): Met PT Short Term Goal 4 (Week 1): Pt will initiate w/c mobility as safe and able PT Short Term Goal 4 - Progress (Week 1): Met PT Short Term Goal 5 - Progress (Week 1): Other (comment)  Skilled Therapeutic Interventions/Progress Updates:    pt received in bed and agreeable to therapy. Pt reports some pain in her R pecs 5/10 related to pulling up to stand, 3/10 LBP, premedicated. Rest and positioning provided as needed. Pt reports being exhausted from family ed earlier in the day and requesting bed level exercise. Therapist provided passive DF stretch with AP talar glide for improved joint motion. Followed stretch with active DF with tapping facilitation over anterior tib, 2 x ~12. Noted muscle activation, but not able to fully lift foot against gravity. Pt then performed 2 x 5 heel slides with rest breaks for fatigue. Attempted SLR, but pt was unable to clear bed. Transitioned to SAQ, 3 x 5 for improved quad strength. Assisted pt with sliding back up to [facility]. Pt remained in bed at end of session and was left with all needs in reach.   Therapy Documentation Precautions:  Precautions Precautions: Fall Precaution Comments: trach stoma,  B foot drop Restrictions Weight Bearing Restrictions: No General:     Therapy/Group: Individual Therapy  Mickel Fuchs 12/07/2021, 3:15  PM

## 2021-12-07 NOTE — Progress Notes (Signed)
Occupational Therapy Session Note  Patient Details  Name: [name] MRN: [id number] Date of Birth: 1955-09-13  Today's Date: 12/07/2021 OT Individual Time: 1000-1100   OT Total Time: 60 min   Short Term Goals: Week 3:  OT Short Term Goal 1 (Week 3): STG = LTG 2/2 ELOS  Skilled Therapeutic Interventions/Progress Updates:    S: Patient agreeable to participate in OT session. Reports 0/10 pain level.    Patient participated in skilled OT session focusing on patient/family education with focus on patient's functional performance during bathing, dressing, tub/shower transfers, and BSC transfers.  Sister, Nephew, and Neighbor present during session. Therapist educated pt/family safety awareness, activity modifications, and use of DME.  Reviewed functional transfer from hospital bed to [facility] while performing a lateral scoot technique in case patient needed to use the bathroom at night when alone. Stand pivot transfer completed with RW and therapist providing CGA to Min Assist. Education provided on managing RW and wheelchair components during all transfers. Pt has both walk-in shower and a tub/shower combo at home. Therapist recommended using tub/shower with purchased ETB. Patient completed tub/shower transfer after therapist provided a demonstration of technique with VC. Pt completed transfer with Min Assist using RW. Therapist assisted with bringing pt's legs in and out of tub. Sister and Neighbor both received hands on training when patient completed a sit to stand with RW. Gait belt used and family educated on use.  All education completed and questions answered.   Therapy Documentation Precautions:  Precautions Precautions: Fall Precaution Comments: trach stoma,  B foot drop Restrictions Weight Bearing Restrictions: Yes   Therapy/Group: Individual Therapy  Limmie Patricia, OTR/L,CBIS  Supplemental OT - [facility] and [facility]  12/07/2021, 12:53 PM

## 2021-12-07 NOTE — Progress Notes (Signed)
PROGRESS NOTE   Subjective/Complaints:  Increased tremors pt denies increased anxiety , also discussed that she has come off oxyIR   ROS: Patient denies fever, CP, SOB, abd pain,  Cough, vision changes  Objective:   No results found. No results for input(s): "WBC", "HGB", "HCT", "PLT" in the last 72 hours.  Recent Labs    12/06/21 0510  K 3.6     Intake/Output Summary (Last 24 hours) at 12/07/2021 0932 Last data filed at 12/07/2021 0730 Gross per 24 hour  Intake 591 ml  Output --  Net 591 ml      Pressure Injury 11/03/21 Coccyx Medial Stage 2 -  Partial thickness loss of dermis presenting as a shallow open injury with a red, pink wound bed without slough. (Active)  11/03/21 1556  Location: Coccyx  Location Orientation: Medial  Staging: Stage 2 -  Partial thickness loss of dermis presenting as a shallow open injury with a red, pink wound bed without slough.  Wound Description (Comments):   Present on Admission:     Physical Exam: Vital Signs Blood pressure (!) 103/56, pulse 91, temperature 97.9 F (36.6 C), resp. rate 15, height 5\' 1"  (1.549 m), weight 45.9 kg, SpO2 94 %.    General: No acute distress, in bed Mood and affect are appropriate Heart: Regular rate and rhythm no rubs murmurs or extra sounds, no JVD Lungs: Clear to auscultation, breathing unlabored, no rales or wheezes Abdomen: Positive bowel sounds, soft nontender to palpation, nondistended Extremities: No clubbing, cyanosis, or edema   decannulated with gauze over stoma  Ext: no clubbing, cyanosis, or edema Psych: pleasant and cooperative   Skin: warm and dry, stage II coccyx 1cm with fibronecrotic debris in center Neurologic: Cranial nerves II through XII intact, motor strength is 4/5 in bilateral deltoid, bicep, tricep, grip,  trace hip flexor, knee extensors,0/5  ankle dorsiflexor and trace plantar flexor Sensory exam mildly reduced  sensation to LT in feet, proprioception intact--no change  Musculoskeletal: Full range of motion in UE , limited AROM in LE due to weakness  No joint swelling   Assessment/Plan: 1. Functional deficits which require 3+ hours per day of interdisciplinary therapy in a comprehensive inpatient rehab setting. Physiatrist is providing close team supervision and 24 hour management of active medical problems listed below. Physiatrist and rehab team continue to assess barriers to discharge/monitor patient progress toward functional and medical goals  Care Tool:  Bathing    Body parts bathed by patient: Right arm, Chest, Left arm, Abdomen, Right upper leg, Left upper leg, Right lower leg, Left lower leg, Face, Buttocks, Front perineal area   Body parts bathed by helper: Buttocks     Bathing assist Assist Level: Supervision/Verbal cueing     Upper Body Dressing/Undressing Upper body dressing   What is the patient wearing?: Pull over shirt    Upper body assist Assist Level: Supervision/Verbal cueing    Lower Body Dressing/Undressing Lower body dressing      What is the patient wearing?: Incontinence brief, Pants     Lower body assist Assist for lower body dressing: Minimal Assistance - Patient > 75%     Toileting Toileting  Toileting assist Assist for toileting: Maximal Assistance - Patient 25 - 49%     Transfers Chair/bed transfer  Transfers assist  Chair/bed transfer activity did not occur: N/A  Chair/bed transfer assist level: Contact Guard/Touching assist     Locomotion Ambulation   Ambulation assist   Ambulation activity did not occur: Safety/medical concerns  Assist level: Maximal Assistance - Patient 25 - 49% Assistive device: Ethelene Hal Max distance: 12 ft   Walk 10 feet activity   Assist  Walk 10 feet activity did not occur: Safety/medical concerns        Walk 50 feet activity   Assist Walk 50 feet with 2 turns activity did not occur:  Safety/medical concerns         Walk 150 feet activity   Assist Walk 150 feet activity did not occur: Safety/medical concerns         Walk 10 feet on uneven surface  activity   Assist Walk 10 feet on uneven surfaces activity did not occur: Safety/medical concerns         Wheelchair     Assist Is the patient using a wheelchair?: Yes Type of Wheelchair: Manual Wheelchair activity did not occur: Safety/medical concerns  Wheelchair assist level: Supervision/Verbal cueing Max wheelchair distance: 150 ft    Wheelchair 50 feet with 2 turns activity    Assist    Wheelchair 50 feet with 2 turns activity did not occur: Safety/medical concerns   Assist Level: Contact Guard/Touching assist   Wheelchair 150 feet activity     Assist  Wheelchair 150 feet activity did not occur: Safety/medical concerns   Assist Level: Supervision/Verbal cueing   Blood pressure (!) 103/56, pulse 91, temperature 97.9 F (36.6 C), resp. rate 15, height 5\' 1"  (1.549 m), weight 45.9 kg, SpO2 94 %.  Medical Problem List and Plan: 1. 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  Antithrombotics: -DVT/anticoagulation:  Pharmaceutical: Lovenox             -antiplatelet therapy: N/a 3. Pain:  Managed by Dr.Phillips. Off Methadone and Oxy IR now on tramadol-  4. Mood: Team to provide ego support/encouragement. LCSW to follow for evaluation and support.              -antipsychotic agents:  N/A 5. Neuropsych: This patient is capable of making decisions on her own behalf. 6. Stage 2 decubitus ulcer: Routine pressure relief measures.              --continue air mattress overlay for MASD/Stage 2 decub.   6/11 begin medihoney to wound daily 7. Fluids/Electrolytes/Nutrition: Strict I/O.  Continue regular diet            Hypo K improved after supplementation, will d/c KCL recheck on Friday ,     Latest Ref Rng & Units 12/06/2021    5:10 AM 12/04/2021    6:38 AM 12/03/2021    6:44 AM  BMP  Glucose 70 - 99 mg/dL  84  86   BUN 8 - 23 mg/dL  7  6   Creatinine 0.44 - 1.00 mg/dL  0.30  0.31   Sodium 135 - 145 mmol/L  132  130  Potassium 3.5 - 5.1 mmol/L 3.6  3.8  3.0   Chloride 98 - 111 mmol/L  98  93   CO2 22 - 32 mmol/L  26  28   Calcium 8.9 - 10.3 mg/dL  8.5  8.3   Needs low dose KCL daily supplement  8. COPD  --Continue Singulair, Yuperi, Brovana and Pulmicort nebs.  -6/16 Decannulated 9. Leucocytosis: trending down    Latest Ref Rng & Units 12/03/2021    6:05 AM 11/26/2021    6:16 AM 11/19/2021    6:41 AM  CBC  WBC 4.0 - 10.5 K/uL 13.5  15.2  16.5   Hemoglobin 12.0 - 15.0 g/dL 9.9  10.4  9.9   Hematocrit 36.0 - 46.0 % 31.4  31.8  31.6   Platelets 150 - 400 K/uL 481  480  426   Trending down  6/26-continues to trend down 10. Panic attacks/Anxiety d/o: Klonopin weaned down 05/30 to  0.5 mg TID.6/16 down to .25mg  TID , D/C ativan, resume prn xanax             --Seroquel Reduced to 12.5 mg BID  -  11. Cardiomyopathy: Continue Metoprolol 12.5 mg bid and Crestor.              --aldactone added 06/07-->monitor for orthostatic symptoms.  No sign of failure 6/25 12. Ileus/intermittent issues w/N/V: Has been refusing miralax and senna intermittently. Will decrease to Miralax to daily w/Senna S daily             --Keep Mg>2.0 and K>4.0 for adequate supplementation/recurrence.          K+ improved still requiring supplements, wnl 6/30             --add Mg for supplement.    13. Pulmonary nodules: PCCM recommends repeat CT 6-8 weeks from 10/28/21 to monitor for resolution.  14. Interstitial cystitis s/p cystectomy and fistula repair: Followed by Dr.Evans.   15.  Dispo discussed need for assistance post d/c , husband reportedly undergoing chemotherapy treatment recently told  he has Stage 4 , discuss in conf Wed , pt is anxious about care needs , has other family members who can assist  73. UTI  -U/A ordered, >50 wbc, bacterial, nitrates  -Keflex 250mg  q6h ordered for 5 days, await culture, +proteus 100K, s to Keflex  17. Hyponatremia, appears to be chronic, may be related to spironolactone       Latest Ref Rng & Units 12/06/2021    5:10 AM 12/04/2021    6:38 AM 12/03/2021    6:44 AM  BMP  Glucose 70 - 99 mg/dL  84  86   BUN 8 - 23 mg/dL  7  6   Creatinine 0.44 - 1.00 mg/dL  0.30  0.31   Sodium 135 - 145 mmol/L  132  130   Potassium 3.5 - 5.1 mmol/L 3.6  3.8  3.0   Chloride 98 - 111 mmol/L  98  93   CO2 22 - 32 mmol/L  26  28   Calcium 8.9 - 10.3 mg/dL  8.5  8.3   6/27-improved slightly to Na 132  18.  Bowel regulation had ileus which resolved, , had BMs yesterday but not thus far today , on senna S 3 BID will reduce to 2 tabs,  -BM on6/27, 6/26 and 6/25, improved 19.  Tremors, not clearly anxiety related may be withdrawal from Oxy IR, pain controlled by Tramadol (which can cause tremors in <10%), did not  get klonopin yet this am , did get xanax at ~2am LOS: 23 days A FACE TO FACE EVALUATION WAS PERFORMED  Erick Colace 12/07/2021, 9:32 AM

## 2021-12-07 NOTE — Progress Notes (Addendum)
Peripheral IV removed from left forearm without problems. PRN Ativan 0.5 mg PO given for anxiety.

## 2021-12-08 NOTE — Plan of Care (Signed)
  Problem: Consults Goal: RH GENERAL PATIENT EDUCATION Description: See Patient Education module for education specifics. Outcome: Progressing   Problem: RH BOWEL ELIMINATION Goal: RH STG MANAGE BOWEL WITH ASSISTANCE Description: STG Manage Bowel with mod I Assistance. Outcome: Progressing Goal: RH STG MANAGE BOWEL W/MEDICATION W/ASSISTANCE Description: STG Manage Bowel with Medication with mod I Assistance. Outcome: Progressing   Problem: RH BLADDER ELIMINATION Goal: RH STG MANAGE BLADDER WITH ASSISTANCE Description: STG Manage Bladder With toileting Assistance Outcome: Progressing   Problem: RH SKIN INTEGRITY Goal: RH STG MAINTAIN SKIN INTEGRITY WITH ASSISTANCE Description: STG Maintain Skin Integrity With min Assistance. Outcome: Progressing   Problem: RH SAFETY Goal: RH STG ADHERE TO SAFETY PRECAUTIONS W/ASSISTANCE/DEVICE Description: STG Adhere to Safety Precautions With cues Assistance/Device. Outcome: Progressing   Problem: RH PAIN MANAGEMENT Goal: RH STG PAIN MANAGED AT OR BELOW PT'S PAIN GOAL Description: At or below level 4 with prns Outcome: Progressing   Problem: RH KNOWLEDGE DEFICIT GENERAL Goal: RH STG INCREASE KNOWLEDGE OF SELF CARE AFTER HOSPITALIZATION Description: Patient and spouse will be able to manage care at discharge using handouts and educational resources independently Outcome: Progressing   Problem: RH KNOWLEDGE DEFICIT Goal: RH STG INCREASE KNOWLEGDE OF HYPERLIPIDEMIA Description: Patient and spouse will be able to manage HLD with medications and dietary modifications using handouts and educational resources independently Outcome: Progressing

## 2021-12-08 NOTE — Plan of Care (Signed)
  Problem: RH Ambulation Goal: LTG Patient will ambulate in controlled environment (PT) Description: LTG: Patient will ambulate in a controlled environment, # of feet with assistance (PT). Flowsheets Taken 12/08/2021 0750 by Golden Pop, PT LTG: Ambulation distance in controlled environment: 69ft with LRAD Taken 11/15/2021 1223 by Peter Congo, PT LTG: Pt will ambulate in controlled environ  assist needed:: Minimal Assistance - Patient > 75% Goal: LTG Patient will ambulate in home environment (PT) Description: LTG: Patient will ambulate in home environment, # of feet with assistance (PT). Flowsheets Taken 12/08/2021 0750 by Golden Pop, PT LTG: Ambulation distance in home environment: 6ft with LRAD Taken 11/15/2021 1223 by Peter Congo, PT LTG: Pt will ambulate in home environ  assist needed:: Minimal Assistance - Patient > 75%

## 2021-12-08 NOTE — Progress Notes (Signed)
PROGRESS NOTE   Subjective/Complaints: Continues to have tremors, but denies anxiety, using Medihoney on Rt buttock wound, has area of redness but skin intact on left buttock.  ROS: Denies fever, CP, SOB, Abd pain, cough, or visual changes.  Objective:   No results found. No results for input(s): "WBC", "HGB", "HCT", "PLT" in the last 72 hours.  Recent Labs    12/06/21 0510  K 3.6    Intake/Output Summary (Last 24 hours) at 12/08/2021 1550 Last data filed at 12/08/2021 0900 Gross per 24 hour  Intake 477 ml  Output --  Net 477 ml     Pressure Injury 11/03/21 Coccyx Medial Stage 2 -  Partial thickness loss of dermis presenting as a shallow open injury with a red, pink wound bed without slough. (Active)  11/03/21 1556  Location: Coccyx  Location Orientation: Medial  Staging: Stage 2 -  Partial thickness loss of dermis presenting as a shallow open injury with a red, pink wound bed without slough.  Wound Description (Comments):   Present on Admission:     Physical Exam: Vital Signs Blood pressure 111/63, pulse (!) 105, temperature 98.5 F (36.9 C), temperature source Oral, resp. rate 18, height 5\' 1"  (1.549 m), weight 45.9 kg, SpO2 95 %.   General: Alert and oriented x 3, No apparent distress HEENT: Head is normocephalic, atraumatic, PERRLA, EOMI, sclera anicteric, oral mucosa pink and moist, dentition intact. Decannulation with gauze over stoma Neck: Supple without JVD or lymphadenopathy Heart: Reg rate and rhythm. No murmurs rubs or gallops Chest: CTA bilaterally without wheezes, rales, or rhonchi; no distress Abdomen: Soft, non-tender, non-distended, bowel sounds positive. Extremities: No clubbing, cyanosis, or edema. Pulses are 2+ Psych: Pt's affect is appropriate. Pt is cooperative Skin: Warm and dry, stage II coccyx 1 cm with fibronecrotic debris in center.  Also has reddened area about 2 cm x 2 cm on left buttock  with skin intact, no pressure ulcer. Neuro: CN II-XII intact, motor strength 4/5 bilateral deltoid, bicep, triceps, grip, trace hip flexor, knee extensors, 0/5 ankle dorsiflexor and trace plantar flexor. Sensory: Mildly reduced sensation to LT in feet, propioception intact-no change. Musculoskeletal: FROM in UE, limited AROM in LE due to weakness. No joint swelling.   Assessment/Plan: 1. Functional deficits which require 3+ hours per day of interdisciplinary therapy in a comprehensive inpatient rehab setting. Physiatrist is providing close team supervision and 24 hour management of active medical problems listed below. Physiatrist and rehab team continue to assess barriers to discharge/monitor patient progress toward functional and medical goals  Care Tool:  Bathing    Body parts bathed by patient: Right arm, Chest, Left arm, Abdomen, Right upper leg, Left upper leg, Right lower leg, Left lower leg, Face, Buttocks, Front perineal area   Body parts bathed by helper: Buttocks     Bathing assist Assist Level: Supervision/Verbal cueing     Upper Body Dressing/Undressing Upper body dressing   What is the patient wearing?: Pull over shirt    Upper body assist Assist Level: Supervision/Verbal cueing    Lower Body Dressing/Undressing Lower body dressing      What is the patient wearing?: Incontinence brief, Pants  Lower body assist Assist for lower body dressing: Minimal Assistance - Patient > 75%     Toileting Toileting    Toileting assist Assist for toileting: Maximal Assistance - Patient 25 - 49%     Transfers Chair/bed transfer  Transfers assist  Chair/bed transfer activity did not occur: N/A  Chair/bed transfer assist level: Contact Guard/Touching assist     Locomotion Ambulation   Ambulation assist   Ambulation activity did not occur: Safety/medical concerns  Assist level: Maximal Assistance - Patient 25 - 49% Assistive device: Walker-Eva Max distance:  12 ft   Walk 10 feet activity   Assist  Walk 10 feet activity did not occur: Safety/medical concerns        Walk 50 feet activity   Assist Walk 50 feet with 2 turns activity did not occur: Safety/medical concerns         Walk 150 feet activity   Assist Walk 150 feet activity did not occur: Safety/medical concerns         Walk 10 feet on uneven surface  activity   Assist Walk 10 feet on uneven surfaces activity did not occur: Safety/medical concerns         Wheelchair     Assist Is the patient using a wheelchair?: Yes Type of Wheelchair: Manual Wheelchair activity did not occur: Safety/medical concerns  Wheelchair assist level: Supervision/Verbal cueing Max wheelchair distance: 150 ft    Wheelchair 50 feet with 2 turns activity    Assist    Wheelchair 50 feet with 2 turns activity did not occur: Safety/medical concerns   Assist Level: Contact Guard/Touching assist   Wheelchair 150 feet activity     Assist  Wheelchair 150 feet activity did not occur: Safety/medical concerns   Assist Level: Supervision/Verbal cueing   Blood pressure 111/63, pulse (!) 105, temperature 98.5 F (36.9 C), temperature source Oral, resp. rate 18, height 5\' 1"  (1.549 m), weight 45.9 kg, SpO2 95 %.  Medical Problem List and Plan: 1. 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  Antithrombotics: -DVT/anticoagulation:  Pharmaceutical: Lovenox             -antiplatelet therapy: N/a 3. Pain:  Managed by Dr.Phillips. Off Methadone and Oxy IR now on tramadol- 7/1 Doing okay off methadone, Oxy IR and currently on tramadol 4. Mood: Team to provide ego support/encouragement. LCSW to follow for evaluation and support.              -antipsychotic agents:  N/A 5. Neuropsych:  This patient is capable of making decisions on her own behalf. 6. Stage 2 decubitus ulcer: Routine pressure relief measures.              --continue air mattress overlay for MASD/Stage 2 decub.   6/11 begin medihoney to wound daily 7/1 Medihoney doing great on rt buttock wound.  Redness 2 cm x 2 cm on left buttuck but no pressure ulcer yet, encourage q2 hr turns. 7. Fluids/Electrolytes/Nutrition: Strict I/O. Continue regular diet            Hypo K improved after supplementation, will d/c KCL recheck on Friday ,     Latest Ref Rng & Units 12/06/2021  5:10 AM 12/04/2021    6:38 AM 12/03/2021    6:44 AM  BMP  Glucose 70 - 99 mg/dL  84  86   BUN 8 - 23 mg/dL  7  6   Creatinine 0.44 - 1.00 mg/dL  0.30  0.31   Sodium 135 - 145 mmol/L  132  130   Potassium 3.5 - 5.1 mmol/L 3.6  3.8  3.0   Chloride 98 - 111 mmol/L  98  93   CO2 22 - 32 mmol/L  26  28   Calcium 8.9 - 10.3 mg/dL  8.5  8.3   Needs low dose KCL daily supplement  7/1 Continue low dose Kcl 10 mEq, trend with weekly labs 8. COPD  --Continue Singulair, Yuperi, Brovana and Pulmicort nebs.  -6/16 Decannulated 9. Leucocytosis: trending down    Latest Ref Rng & Units 12/03/2021    6:05 AM 11/26/2021    6:16 AM 11/19/2021    6:41 AM  CBC  WBC 4.0 - 10.5 K/uL 13.5  15.2  16.5   Hemoglobin 12.0 - 15.0 g/dL 9.9  10.4  9.9   Hematocrit 36.0 - 46.0 % 31.4  31.8  31.6   Platelets 150 - 400 K/uL 481  480  426   Trending down  6/26-continues to trend down 7/1 Continue to follow with weekly labs 10. Panic attacks/Anxiety d/o: Klonopin weaned down 05/30 to  0.5 mg TID.6/16 down to .25mg  TID , D/C ativan, resume prn xanax             --Seroquel Reduced to 12.5 mg BID  -  11. Cardiomyopathy: Continue Metoprolol 12.5 mg bid and Crestor.              --aldactone added 06/07-->monitor for orthostatic symptoms.  No sign of failure 6/25 12. Ileus/intermittent issues w/N/V: Has been refusing miralax and senna intermittently. Will decrease to  Miralax to daily w/Senna S daily             --Keep Mg>2.0 and K>4.0 for adequate supplementation/recurrence.          K+ improved still requiring supplements, wnl 6/30             --add Mg for supplement.  13. Pulmonary nodules: PCCM recommends repeat CT 6-8 weeks from 10/28/21 to monitor for resolution.  14. Interstitial cystitis s/p cystectomy and fistula repair: Followed by Dr.Evans.   25.  Dispo discussed need for assistance post d/c , husband reportedly undergoing chemotherapy treatment recently told he has Stage 4 , discuss in conf Wed , pt is anxious about care needs , has other family members who can assist  110. UTI  -U/A ordered, >50 wbc, bacterial, nitrates  -Keflex 250mg  q6h ordered for 5 days, await culture, +proteus 100K, s to Keflex  7/1 Keflex course completed 17. Hyponatremia, appears to be chronic, may be related to spironolactone       Latest Ref Rng & Units 12/06/2021    5:10 AM 12/04/2021    6:38 AM 12/03/2021    6:44 AM  BMP  Glucose 70 - 99 mg/dL  84  86   BUN 8 - 23 mg/dL  7  6   Creatinine 0.44 - 1.00 mg/dL  0.30  0.31   Sodium 135 - 145 mmol/L  132  130   Potassium 3.5 - 5.1 mmol/L 3.6  3.8  3.0   Chloride 98 - 111 mmol/L  98  93   CO2 22 - 32 mmol/L  26  28   Calcium 8.9 - 10.3 mg/dL  8.5  8.3   6/27-improved slightly to Na 132 7/1 Trend with weekly labs 18.  Bowel regulation had ileus which resolved, , had BMs yesterday but not thus far today , on senna S 3 BID will reduce to 2 tabs,  -BM on6/27, 6/26 and 6/25, improved 7/1 LBM 12/07/21 19.  Tremors, not clearly anxiety related may be withdrawal from Oxy IR, pain controlled by Tramadol (which can cause tremors in <10%), did not get klonopin yet this am , did get xanax at ~2am 7/1 Continue to monitor, intermittent LOS: 24 days A FACE TO FACE EVALUATION WAS PERFORMED  Luetta Nutting 12/08/2021, 3:50 PM

## 2021-12-08 NOTE — Progress Notes (Signed)
Physical Therapy Session Note  Patient Details  Name: [name] MRN: [id number] Date of Birth: Feb 06, 1956  Today's Date: 12/08/2021 PT Individual Time: 1116-1200 PT Individual Time Calculation (min): 44 min   Short Term Goals: Week 1:  PT Short Term Goal 1 (Week 1): Pt will complete bed mobility with mod A x 1 PT Short Term Goal 1 - Progress (Week 1): Met PT Short Term Goal 2 (Week 1): Pt will complete least restrictive transfer with assist x 1 PT Short Term Goal 2 - Progress (Week 1): Met PT Short Term Goal 3 (Week 1): Pt will initiate gait training as safe and able PT Short Term Goal 3 - Progress (Week 1): Met PT Short Term Goal 4 (Week 1): Pt will initiate w/c mobility as safe and able PT Short Term Goal 4 - Progress (Week 1): Met PT Short Term Goal 5 - Progress (Week 1): Other (comment) Week 2:  PT Short Term Goal 1 (Week 2): Pt will ambulate 23f with mod assist and LRAD with +2 for WC follow PT Short Term Goal 1 - Progress (Week 2): Met PT Short Term Goal 2 (Week 2): Pt will propell  WC >15101fwith supervision assist PT Short Term Goal 2 - Progress (Week 2): Met PT Short Term Goal 3 (Week 2): [facility] consistently with LRAD PT Short Term Goal 3 - Progress (Week 2): Met PT Short Term Goal 4 (Week 2): Pt will perform 5xSTS PT Short Term Goal 4 - Progress (Week 2): Met Week 3:  PT Short Term Goal 1 (Week 3): Pt will ambulate 2058fith min assist and LRAD PT Short Term Goal 2 (Week 3): Pt will transfer [facility] and [facility] consistently PT Short Term Goal 3 (Week 3): Pt will propell WC >[id number] assist PT Short Term Goal 4 (Week 3): Family education will be initiated. Week 4:     Skilled Therapeutic Interventions/Progress Updates:    Pt received supine in bed and agreeable to PT. Supine>sit transfer without assist or cues from PT. PT donned AFO on BLE with total A; pt sitting EOB without assist. Stand pivot transfer to [facility]  [facility] and cues for posture initially on standing. Pt transported to hospital atrium. WC mobility through atrium with min cues for safety and direction of WC. Pt performed seated BLE therex, hip flexion LAQ, HS curl, hip abduction, hip adduction ankle DF. Manual resistance provided for all therex except DF and hip flexion with AAROM into available range. Each performed x 12 BLE Sit<>stand transfers with RW throughout session with cues for improved RLE position and UE placement; CGA from PT for safety.  Gait training with RW x [id number] with RW and supervision assist from PT. Cues for improved hip flexion and terminal knee extension to improve step length.  Sit>supine completed with min assist at the RLE, and left supine in bed with call bell in reach and all needs met.     Therapy Documentation Precautions:  Precautions Precautions: Fall Precaution Comments: trach stoma,  B foot drop Restrictions Weight Bearing Restrictions: No  Vital Signs: Therapy Vitals Temp: 98.5 F (36.9 C) Temp Source: Oral Pulse Rate: (!) 105 Resp: 18 BP: 111/63 Patient Position (if appropriate): Lying Oxygen Therapy SpO2: 95 % O2 Device: Room Air Pain: denies    Therapy/Group: Individual Therapy  AustiLorie Phenix2023, 3:05 PM

## 2021-12-08 NOTE — Plan of Care (Signed)
  Problem: RH BOWEL ELIMINATION Goal: RH STG MANAGE BOWEL WITH ASSISTANCE Description: STG Manage Bowel with mod I Assistance. Outcome: Progressing Goal: RH STG MANAGE BOWEL W/MEDICATION W/ASSISTANCE Description: STG Manage Bowel with Medication with mod I Assistance. Outcome: Progressing   Problem: RH BLADDER ELIMINATION Goal: RH STG MANAGE BLADDER WITH ASSISTANCE Description: STG Manage Bladder With toileting Assistance Outcome: Progressing   Problem: RH SKIN INTEGRITY Goal: RH STG MAINTAIN SKIN INTEGRITY WITH ASSISTANCE Description: STG Maintain Skin Integrity With min Assistance. Outcome: Progressing   Problem: RH SAFETY Goal: RH STG ADHERE TO SAFETY PRECAUTIONS W/ASSISTANCE/DEVICE Description: STG Adhere to Safety Precautions With cues Assistance/Device. Outcome: Progressing   Problem: RH PAIN MANAGEMENT Goal: RH STG PAIN MANAGED AT OR BELOW PT'S PAIN GOAL Description: At or below level 4 with prns Outcome: Progressing

## 2021-12-09 NOTE — Progress Notes (Signed)
Restless first part of shift. Congested, weak cough, one episode of coughing up thick, light,green sputum. PRN robitussin given at 2132 & 0619. PRN xanax given at midnight. Excited and anxious about upcoming discharge. [name] A

## 2021-12-09 NOTE — Progress Notes (Signed)
PROGRESS NOTE   Subjective/Complaints: No report of tremors.  Main complaint is having some yellow sputum coughed up during the night and this morning.  No breathing issues or wheezing.  Pt says this started a week ago.  ROS: Denies fever, CP, SOB, Abd pain, cough, or visual changes.  Objective:   No results found. No results for input(s): "WBC", "HGB", "HCT", "PLT" in the last 72 hours.  No results for input(s): "NA", "K", "CL", "CO2", "GLUCOSE", "BUN", "CREATININE", "CALCIUM" in the last 72 hours.   Intake/Output Summary (Last 24 hours) at 12/09/2021 1250 Last data filed at 12/09/2021 0725 Gross per 24 hour  Intake 600 ml  Output --  Net 600 ml     Pressure Injury 11/03/21 Coccyx Medial Stage 2 -  Partial thickness loss of dermis presenting as a shallow open injury with a red, pink wound bed without slough. (Active)  11/03/21 1556  Location: Coccyx  Location Orientation: Medial  Staging: Stage 2 -  Partial thickness loss of dermis presenting as a shallow open injury with a red, pink wound bed without slough.  Wound Description (Comments):   Present on Admission:     Physical Exam: Vital Signs Blood pressure 95/62, pulse 93, temperature 97.6 F (36.4 C), resp. rate 14, height 5\' 1"  (1.549 m), weight 45.9 kg, SpO2 95 %.   General: Alert and oriented x 3, No apparent distress HEENT: Head is normocephalic, atraumatic, PERRLA, EOMI, sclera anicteric, oral mucosa pink and moist, dentition intact. Decannulation with gauze over stoma Neck: Supple without JVD or lymphadenopathy Heart: Reg rate and rhythm. No murmurs rubs or gallops Chest: CTA bilaterally without wheezes, rales, or rhonchi; no distress.  Coughed up sputum last night and this morning, small, yellow.   Abdomen: Soft, non-tender, non-distended, bowel sounds positive. Extremities: No clubbing, cyanosis, or edema. Pulses are 2+ Psych: Pt's affect is appropriate. Pt  is cooperative Skin: Warm and dry, stage II coccyx 1 cm with fibronecrotic debris in center.  Also has reddened area about 2 cm x 2 cm on left buttock with skin intact, no pressure ulcer. Neuro: CN II-XII intact, motor strength 4/5 bilateral deltoid, bicep, triceps, grip, trace hip flexor, knee extensors, 0/5 ankle dorsiflexor and trace plantar flexor. Sensory: Mildly reduced sensation to LT in feet, propioception intact-no change. Musculoskeletal: FROM in UE, limited AROM in LE due to weakness. No joint swelling.   Assessment/Plan: 1. Functional deficits which require 3+ hours per day of interdisciplinary therapy in a comprehensive inpatient rehab setting. Physiatrist is providing close team supervision and 24 hour management of active medical problems listed below. Physiatrist and rehab team continue to assess barriers to discharge/monitor patient progress toward functional and medical goals  Care Tool:  Bathing    Body parts bathed by patient: Right arm, Chest, Left arm, Abdomen, Right upper leg, Left upper leg, Right lower leg, Left lower leg, Face, Buttocks, Front perineal area   Body parts bathed by helper: Buttocks     Bathing assist Assist Level: Supervision/Verbal cueing     Upper Body Dressing/Undressing Upper body dressing   What is the patient wearing?: Pull over shirt    Upper body assist Assist Level: Supervision/Verbal  cueing    Lower Body Dressing/Undressing Lower body dressing      What is the patient wearing?: Incontinence brief, Pants     Lower body assist Assist for lower body dressing: Minimal Assistance - Patient > 75%     Toileting Toileting    Toileting assist Assist for toileting: Maximal Assistance - Patient 25 - 49%     Transfers Chair/bed transfer  Transfers assist  Chair/bed transfer activity did not occur: N/A  Chair/bed transfer assist level: Contact Guard/Touching assist     Locomotion Ambulation   Ambulation assist    Ambulation activity did not occur: Safety/medical concerns  Assist level: Maximal Assistance - Patient 25 - 49% Assistive device: Walker-Eva Max distance: 12 ft   Walk 10 feet activity   Assist  Walk 10 feet activity did not occur: Safety/medical concerns        Walk 50 feet activity   Assist Walk 50 feet with 2 turns activity did not occur: Safety/medical concerns         Walk 150 feet activity   Assist Walk 150 feet activity did not occur: Safety/medical concerns         Walk 10 feet on uneven surface  activity   Assist Walk 10 feet on uneven surfaces activity did not occur: Safety/medical concerns         Wheelchair     Assist Is the patient using a wheelchair?: Yes Type of Wheelchair: Manual Wheelchair activity did not occur: Safety/medical concerns  Wheelchair assist level: Supervision/Verbal cueing Max wheelchair distance: 150 ft    Wheelchair 50 feet with 2 turns activity    Assist    Wheelchair 50 feet with 2 turns activity did not occur: Safety/medical concerns   Assist Level: Contact Guard/Touching assist   Wheelchair 150 feet activity     Assist  Wheelchair 150 feet activity did not occur: Safety/medical concerns   Assist Level: Supervision/Verbal cueing   Blood pressure 95/62, pulse 93, temperature 97.6 F (36.4 C), resp. rate 14, height 5\' 1"  (1.549 m), weight 45.9 kg, SpO2 95 %.  Medical Problem List and Plan: 1. 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  Antithrombotics: -DVT/anticoagulation:  Pharmaceutical: Lovenox             -antiplatelet therapy: N/a 3. Pain:  Managed by Dr.Phillips. Off Methadone and Oxy IR now on tramadol- 7/1 Doing okay off methadone, Oxy IR and currently on tramadol 7/2 No pain issues  today. 4. Mood: Team to provide ego support/encouragement. LCSW to follow for evaluation and support.              -antipsychotic agents:  N/A 5. Neuropsych: This patient is capable of making decisions on her own behalf. 6. Stage 2 decubitus ulcer: Routine pressure relief measures.              --continue air mattress overlay for MASD/Stage 2 decub.   6/11 begin medihoney to wound daily 7/1 Medihoney doing great on rt buttock wound.  Redness 2 cm x 2 cm on left buttuck but no pressure ulcer yet, encourage q2 hr turns. 7/2 Saw dressing change with nurse, healing well.  Skin intact without redness  of left buttock. 7. Fluids/Electrolytes/Nutrition: Strict I/O. Continue regular diet            Hypo K improved after supplementation, will d/c KCL recheck on Friday ,     Latest Ref Rng & Units 12/06/2021    5:10 AM 12/04/2021    6:38 AM 12/03/2021    6:44 AM  BMP  Glucose 70 - 99 mg/dL  84  86   BUN 8 - 23 mg/dL  7  6   Creatinine 0.96 - 1.00 mg/dL  2.83  6.62   Sodium 947 - 145 mmol/L  132  130   Potassium 3.5 - 5.1 mmol/L 3.6  3.8  3.0   Chloride 98 - 111 mmol/L  98  93   CO2 22 - 32 mmol/L  26  28   Calcium 8.9 - 10.3 mg/dL  8.5  8.3   Needs low dose KCL daily supplement  7/2 Continue low dose Kcl 10 mEq, trend with weekly labs 8. COPD  --Continue Singulair, Yuperi, Brovana and Pulmicort nebs.  -6/16 Decannulated 7/2 Taking Mucinex along with nebs.  Some yellow sputum production last night and this morning, lung sounds clear, no distress, afebrile, and WBC's trending down over past few weeks. Discussed with Dr. Berline Chough, MD, no CXR recommended at this time, but continue to monitor. 9. Leucocytosis: trending down    Latest Ref Rng & Units 12/03/2021    6:05 AM 11/26/2021    6:16 AM 11/19/2021    6:41 AM  CBC  WBC 4.0 - 10.5 K/uL 13.5  15.2  16.5   Hemoglobin 12.0 - 15.0 g/dL 9.9  65.4  9.9   Hematocrit 36.0 - 46.0 % 31.4  31.8  31.6   Platelets 150 - 400 K/uL 481  480  426   Trending  down  6/26-continues to trend down 7/2 Continue to follow with weekly labs 10. Panic attacks/Anxiety d/o: Klonopin weaned down 05/30 to  0.5 mg TID.6/16 down to .25mg  TID , D/C ativan, resume prn xanax             --Seroquel Reduced to 12.5 mg BID  -  11. Cardiomyopathy: Continue Metoprolol 12.5 mg bid and Crestor.              --aldactone added 06/07-->monitor for orthostatic symptoms.  No sign of failure 6/25 12. Ileus/intermittent issues w/N/V: Has been refusing miralax and senna intermittently. Will decrease to Miralax to daily w/Senna S daily             --Keep Mg>2.0 and K>4.0 for adequate supplementation/recurrence.          K+ improved still requiring supplements, wnl 6/30             --add Mg for supplement.  13. Pulmonary nodules: PCCM recommends repeat CT 6-8 weeks from 10/28/21 to monitor for resolution.  14. Interstitial cystitis s/p cystectomy and fistula repair: Followed by Dr.Evans.   15.  Dispo discussed need for assistance post d/c , husband reportedly undergoing chemotherapy treatment recently told he has Stage 4 , discuss in conf Wed , pt is anxious about care needs , has other family members who can assist  16. UTI  -U/A ordered, >50 wbc, bacterial, nitrates  -Keflex 250mg  q6h ordered for 5 days, await culture, +proteus 100K, s to Keflex  7/1 Keflex course completed 17. Hyponatremia, appears to be chronic, may be related to spironolactone       Latest Ref Rng & Units 12/06/2021  5:10 AM 12/04/2021    6:38 AM 12/03/2021    6:44 AM  BMP  Glucose 70 - 99 mg/dL  84  86   BUN 8 - 23 mg/dL  7  6   Creatinine 0.44 - 1.00 mg/dL  0.30  0.31   Sodium 135 - 145 mmol/L  132  130   Potassium 3.5 - 5.1 mmol/L 3.6  3.8  3.0   Chloride 98 - 111 mmol/L  98  93   CO2 22 - 32 mmol/L  26  28   Calcium 8.9 - 10.3 mg/dL  8.5  8.3   6/27-improved slightly to Na 132 7/2 Trend with weekly labs 18.  Bowel regulation had ileus which resolved, , had BMs yesterday but not thus far today ,  on senna S 3 BID will reduce to 2 tabs,  -BM on6/27, 6/26 and 6/25, improved 7/2 LBM 12/07/21 19.  Tremors, not clearly anxiety related may be withdrawal from Oxy IR, pain controlled by Tramadol (which can cause tremors in <10%), did not get klonopin yet this am , did get xanax at ~2am 7/1 Continue to monitor, intermittent 7/2 No tremors noted or reported today. LOS: 25 days A FACE TO FACE EVALUATION WAS PERFORMED  Luetta Nutting 12/09/2021, 12:50 PM

## 2021-12-09 NOTE — Progress Notes (Signed)
Occupational Therapy Session Note  Patient Details  Name: ZAMYRA ALLENSWORTH MRN: [id number] Date of Birth: 07-03-1955  Today's Date: 12/09/2021 OT Individual Time: 0225-0245 OT Individual Time Calculation (min): 20 min    Short Term Goals: Week 1:  OT Short Term Goal 1 (Week 1): Pt will complete toilet transfer with max A and LRAD. OT Short Term Goal 1 - Progress (Week 1): Met OT Short Term Goal 2 (Week 1): Pt will complete sit to stand with min A of 2 and LRAD in prep for standing ADL. OT Short Term Goal 2 - Progress (Week 1): Not met OT Short Term Goal 3 (Week 1): Pt will don pants with max A of 1. OT Short Term Goal 3 - Progress (Week 1): Met Week 2:  OT Short Term Goal 1 (Week 2): Pt will complete sit to stand with min A of 2 and LRAD in prep for standing ADL. OT Short Term Goal 1 - Progress (Week 2): Met OT Short Term Goal 2 (Week 2): Pt will complete 1/3 toileting tasks with mod A. OT Short Term Goal 2 - Progress (Week 2): Met OT Short Term Goal 3 (Week 2): Pt will complete standing grooming task at sink with max A. OT Short Term Goal 3 - Progress (Week 2): Not met OT Short Term Goal 4 (Week 2): Pt will don pants with mod A. OT Short Term Goal 4 - Progress (Week 2): Not met  Skilled Therapeutic Interventions/Progress Updates:    Patient in bed upon arrival, patient able to come from supine in bed to EOB with CGA incorporating the grab bars for positioning.  The pt was able to come from sit to stand 4X with MinA using the RW for additional balance.  The pt was able to donn LB garments, inclusive of hospital pants with MinA for clearing her feet using the RW for balance. The pt returned to her room after being weighed and was able return to bed LOF with MinA incorporating the bed rails, she was able to demonstrate bed mobility with SBA for roll from supine to sidelying for  improvements in positioning.  The pt's bedside table and call light were within reach, her alarm was activated and all  additional needs were addressed.  The pt had no report of pain this treatment session.  Therapy Documentation Precautions:  Precautions Precautions: Fall Precaution Comments: trach stoma,  B foot drop Restrictions Weight Bearing Restrictions: No  Therapy/Group: Individual Therapy  Yvonne Kendall 12/09/2021, 5:11 PM

## 2021-12-10 ENCOUNTER — Inpatient Hospital Stay (HOSPITAL_COMMUNITY): Payer: Medicare Other

## 2021-12-10 LAB — BASIC METABOLIC PANEL
Anion gap: 10 (ref 5–15)
BUN: 7 mg/dL — ABNORMAL LOW (ref 8–23)
CO2: 27 mmol/L (ref 22–32)
Calcium: 8.4 mg/dL — ABNORMAL LOW (ref 8.9–10.3)
Chloride: 92 mmol/L — ABNORMAL LOW (ref 98–111)
Creatinine, Ser: 0.41 mg/dL — ABNORMAL LOW (ref 0.44–1.00)
GFR, Estimated: >60 mL/min (ref >60)
Glucose, Bld: 126 mg/dL — ABNORMAL HIGH (ref 70–99)
Potassium: 3.5 mmol/L (ref 3.5–5.1)
Sodium: 129 mmol/L — ABNORMAL LOW (ref 135–145)

## 2021-12-10 LAB — CBC
HCT: 29.2 % — ABNORMAL LOW (ref 36.0–46.0)
Hemoglobin: 9.3 g/dL — ABNORMAL LOW (ref 12.0–15.0)
MCH: 28.9 pg (ref 26.0–34.0)
MCHC: 31.8 g/dL (ref 30.0–36.0)
MCV: 90.7 fL (ref 80.0–100.0)
Platelets: 643 10*3/uL — ABNORMAL HIGH (ref 150–400)
RBC: 3.22 MIL/uL — ABNORMAL LOW (ref 3.87–5.11)
RDW: 16.3 % — ABNORMAL HIGH (ref 11.5–15.5)
WBC: 14.8 10*3/uL — ABNORMAL HIGH (ref 4.0–10.5)
nRBC: 0 % (ref 0.0–0.2)

## 2021-12-10 MED ORDER — IOHEXOL 300 MG/ML  SOLN
80.0000 mL | Freq: Once | INTRAMUSCULAR | Status: AC | PRN
  Administered 2021-12-10: 80 mL via INTRAVENOUS

## 2021-12-10 MED ORDER — FLUTICASONE PROPIONATE 50 MCG/ACT NA SUSP
1.0000 | Freq: Every day | NASAL | Status: DC
  Administered 2021-12-10 – 2021-12-12 (×3): 1 via NASAL
  Filled 2021-12-10: qty 16

## 2021-12-10 NOTE — Progress Notes (Signed)
Occupational Therapy Discharge Summary  Patient Details  Name: AHILYN NELL MRN: [id number] Date of Birth: [birth year]  {CHL IP REHAB OT TIME CALCULATIONS:[id number]}   Patient has met 8 of 9 long term goals due to improved activity tolerance, postural control, ability to compensate for deficits, functional use of  bilateral extremity, and improved coordination.  Patient to discharge at overall  Mod I to [place]  level.  Patient's care partner  (Sister, [place], and Redmond Pulling are able  to provide the necessary physical assistance at discharge.    Reasons goals not met: Patient requires Set-up to complete UB dressing versus Independent as she requires retrieval of clothing prior to donning.  Recommendation:  Patient will benefit from ongoing skilled OT services in home health setting to continue to advance functional skills in the area of BADL and Reduce care partner burden.  Equipment: Drop arm BSC, Extended tub bench, RW, Manual wheelchair  Reasons for discharge: treatment goals met  Patient/family agrees with progress made and goals achieved: Yes  OT Discharge Precautions/Restrictions  Precautions Precautions: Fall Precaution Comments: B foot frop Restrictions Weight Bearing Restrictions: No  Pain   ADL ADL Eating: Set up Where Assessed-Eating: Bed level Grooming: Setup Where Assessed-Grooming: Sitting at sink Upper Body Bathing: Supervision/safety Where Assessed-Upper Body Bathing: Sitting at sink Lower Body Bathing: Maximal assistance Where Assessed-Lower Body Bathing: Standing at sink Upper Body Dressing: Minimal assistance Where Assessed-Upper Body Dressing: Sitting at sink Lower Body Dressing: Dependent Where Assessed-Lower Body Dressing: Wheelchair Toileting: Dependent Where Assessed-Toileting: Bedside Commode, Bed level Toilet Transfer: Maximal assistance Toilet Transfer Method: Stand pivot Science writer: Radiographer, therapeutic:  Unable to assess Social research officer, government: Unable to assess Vision Baseline Vision/History: 1 Wears glasses (bifocals) Patient Visual Report: No change from baseline Vision Assessment?: No apparent visual deficits Perception  Perception: Within Functional Limits Praxis Praxis: Intact Cognition Cognition Overall Cognitive Status: Within Functional Limits for tasks assessed Arousal/Alertness: Awake/alert Memory: Appears intact Sensation Sensation Light Touch: Appears Intact Hot/Cold: Appears Intact Proprioception: Appears Intact Stereognosis: Appears Intact Coordination Gross Motor Movements are Fluid and Coordinated: No Fine Motor Movements are Fluid and Coordinated: No Coordination and Movement Description: functional although fatigues during self care task Finger Nose Finger Test: [facility] Motor  Motor Motor: Abnormal postural alignment and control Motor - Skilled Clinical Observations: Able to complete functional self care tasks with increased time and rest breaks. Mobility  Bed Mobility Bed Mobility: Rolling Right;Rolling Left;Sit to Supine;Supine to Sit;Sitting - Scoot to [employer] of Bed Rolling Right: Independent with assistive device Rolling Left: Independent with assistive device Supine to Sit: Supervision/Verbal cueing Sitting - Scoot to Edge of Bed: Supervision/Verbal cueing Sit to Supine: Minimal Assistance - Patient > 75% Scooting to HOB: Supervision/Verbal Cueing Transfers Sit to Stand: Supervision/Verbal cueing Stand to Sit: Supervision/Verbal cueing  Trunk/Postural Assessment  Cervical Assessment Cervical Assessment: Exceptions to [facility] Fall (forward head) Thoracic Assessment Thoracic Assessment: Exceptions to [facility] (rounded shoulders) Lumbar Assessment Lumbar Assessment: Exceptions to [facility] (posterior pelvic tilt)  Balance Balance Balance Assessed: Yes Static Sitting Balance Static Sitting - Balance Support: Feet supported Static Sitting - Level of Assistance: 6:  Modified independent (Device/Increase time) Dynamic Sitting Balance Dynamic Sitting - Balance Support: No upper extremity supported;Feet supported;During functional activity Dynamic Sitting - Level of Assistance: 5: Stand by assistance Static Standing Balance Static Standing - Balance Support: Bilateral upper extremity supported;During functional activity Static Standing - Level of Assistance: 5: Stand by assistance Dynamic Standing Balance Dynamic Standing - Balance Support: Bilateral upper  extremity supported;During functional activity Dynamic Standing - Level of Assistance: 4: Min assist Dynamic Standing - Balance Activities: Reaching for objects Extremity/Trunk Assessment RUE Assessment RUE Assessment: Exceptions to [facility] General Strength Comments: generalized weakness 4/5 shoulder, elbow, wrist in all ranges. Decreased gross grasp. LUE Assessment LUE Assessment: Exceptions to [facility] General Strength Comments: generalized weakness 4/5 shoulder, elbow, wrist in all ranges. Decreased gross grasp.   Kaedon Fanelli, Clarene Duke 12/10/2021, 9:15 PM

## 2021-12-10 NOTE — Progress Notes (Signed)
Received pt in bed, stable, c/o low back pain, Tramadol given as scheduled.  Waiting anxiously for chest CT scan.

## 2021-12-10 NOTE — Progress Notes (Incomplete)
Physical Therapy Session Note  Patient Details  Name: [name] MRN: [id number] Date of Birth: Oct 01, 1955  Today's Date: 12/10/2021 PT Individual Time: 8182-9937 PT Individual Time Calculation (min): 74 min   Short Term Goals: Week 2:  PT Short Term Goal 1 (Week 2): Pt will ambulate 66f with mod assist and LRAD with +2 for WC follow PT Short Term Goal 1 - Progress (Week 2): Met PT Short Term Goal 2 (Week 2): Pt will propell  WC >1532fwith supervision assist PT Short Term Goal 2 - Progress (Week 2): Met PT Short Term Goal 3 (Week 2): [facility] consistently with LRAD PT Short Term Goal 3 - Progress (Week 2): Met PT Short Term Goal 4 (Week 2): Pt will perform 5xSTS PT Short Term Goal 4 - Progress (Week 2): Met Week 3:  PT Short Term Goal 1 (Week 3): Pt will ambulate 2057fith min assist and LRAD PT Short Term Goal 2 (Week 3): Pt will transfer to and [facility] min assist consistently PT Short Term Goal 3 (Week 3): Pt will propell WC >150f43fthout assist PT Short Term Goal 4 (Week 3): Family education will be initiated.  Skilled Therapeutic Interventions/Progress Updates:  Patient supine in room on entrance to room and completing lunch. Patient alert and agreeable to PT session.   Patient with no pain complaint throughout session. Fatigue noted with increased movement and slight improvement in activity tolerance.  Therapeutic Activity: Bed Mobility: Patient performed supine --> sit with Mod I. At end of session, pt requires MinA to bring BLE to bed surfaces. VC/ tc required for Bil feet to bed. Transfers: Patient performed sit<>stand and stand pivot transfers throughout session with CGA/ supervision. Continues to demo weakness in BLE, however has significantly improved since last seeing pt 5 days ago. Provided verbal cues for technique. Squat pivot requires ModA to clear seat. Stand pivot completed with CGA. Pt relating improved safety with  stand pivot.   Gait Training:  Prosthetist present delivering bil AFOs and assisting with amb assessment and w/c follow. New shoes provided by sister do not allow for ease of donning/ doffing with AFO present as tongue of shoe is not separate from upper. AFOs donned MaxA with education on donning/ doffing. Patient ambulated 45' 68/ 65' 8 with supervision/ CGA and w/c follow for fatigue. Demonstrated intermittent toe catch on R foot. Prosthetist will be able to follow pt if toe cap required even after d/c. Provided vc/ tc for  increasing step height on RLE with pt able to self correct 75% of time.   Wheelchair Mobility:  Patient propelled wheelchair 150' x1 with supervision. Is able to park, lock brakes with close supervision. Requires MinA for feet on/ off leg rests. CGA to MinA to move legrests out of way. Provided vc for technique throughout parts management.   Neuromuscular Re-ed: NMR facilitated during session with focus on standing balance and muscle fiber recruitment/ activation. Pt guided in toe taps to 3" steps . NMR performed for improvements in motor control and coordination, balance, sequencing, judgement, and self confidence/ efficacy in performing all aspects of mobility at highest level of independence.   Patient ***  in *** at end of session with brakes locked, *** alarm set, and all needs within reach.   Therapy Documentation Precautions:  Precautions Precautions: Fall Precaution Comments: trach stoma,  B foot drop Restrictions Weight Bearing Restrictions: No General:   Vital Signs: Therapy Vitals  Temp: 97.8 F (36.6 C) Temp Source: Oral Pulse Rate: 94 Resp: 19 BP: 98/65 Patient Position (if appropriate): Lying Oxygen Therapy SpO2: 93 % O2 Device: Room Air Pain:    Therapy/Group: Individual Therapy  Alger Simons PT, DPT, CSRS 12/10/2021, 5:36 PM

## 2021-12-10 NOTE — Progress Notes (Signed)
Nutrition Follow-up  DOCUMENTATION CODES:   Severe malnutrition in context of chronic illness  INTERVENTION:   Continue Multivitamin w/ minerals daily Discontinue Ensure Enlive Continue Magic cup BID with meals, each supplement provides 290 kcal and 9 grams of protein  NUTRITION DIAGNOSIS:   Severe Malnutrition related to chronic illness as evidenced by severe muscle depletion, severe fat depletion. - Ongoing  GOAL:   Patient will meet greater than or equal to 90% of their needs - Progressing  MONITOR:   PO intake, Supplement acceptance, Labs, Weight trends, Skin  REASON FOR ASSESSMENT:   Consult Assessment of nutrition requirement/status  ASSESSMENT:   66 y.o. female admitted to CIR after a long hospital admission due to acute respiratory failure and hypercapnia, requiring intubation and a trach. During admission, pt developed a SBO vs ileus, treated with liquids diet. PMH includes GERD, COPD, and severe anxiety.  6/07 - admitted to CIR; Regular diet 6/09 - NPO; advanced to CLD 6/10 - diet advanced to full liquids 6/11 - diet advanced to regular diet 6/16 - decannulated  Pt reports that she is eating and will save things off her tray for snacks between meals. States that she just does not like the food being served. Shares that she has a list of foods that she wants when she goes home in two days. Reports that she is not drinking the Ensure's due to not liking them. Denies any vomiting; endorses nausea that has resolved with medications.   Per EMR, pt PO intake includes: 6/30: Dinner 80% 7/01: Breakfast 25%, Lunch 50%, Dinner 75% 7/02: Breakfast 50%, Lunch 65%, Dinner 75% 7/03: Breakfast 35%  Medications reviewed and include: MVI, Potassium Chloride, Senokot, Spironolactone Labs reviewed: Sodium 129  Diet Order:   Diet Order             Diet regular Room service appropriate? Yes; Fluid consistency: Thin  Diet effective now                   EDUCATION  NEEDS:   No education needs have been identified at this time  Skin:  Skin Integrity Issues:: Stage II, Other (Comment) Stage II: Coccyx Other: MASD - Bilateral Buttocks  Last BM:  7/1  Height:   Ht Readings from Last 1 Encounters:  11/16/21 5\' 1"  (1.549 m)    Weight:   Wt Readings from Last 1 Encounters:  12/09/21 45.4 kg    Ideal Body Weight:  47.7 kg  BMI:  Body mass index is 18.89 kg/m.  Estimated Nutritional Needs:   Kcal:  1700-1900  Protein:  90-110 grams  Fluid:  >/= 1.7 L    02/09/22 RD, LDN Clinical Dietitian See [facility] for contact information.

## 2021-12-10 NOTE — Progress Notes (Signed)
Inpatient Rehabilitation Care Coordinator Discharge Note   Patient Details  Name: [name] MRN: [id number] Date of Birth: 1955/09/15   Discharge location: Home  Length of Stay: 28 Days  Discharge activity level: Cga/Min A  Home/community participation: Sister and hired assistance  Patient response HU:TMLYYT Literacy - How often do you need to have someone help you when you read instructions, pamphlets, or other written material from your doctor or pharmacy?: Sometimes  Patient response KP:TWSFKC Isolation - How often do you feel lonely or isolated from those around you?: Never  Services provided included: SW, Neuropsych, Pharmacy, TR, CM, RN, SLP, OT, PT, RD, MD  Financial Services:  Financial Services Utilized: Private Insurance BCBS COMM  Choices offered to/list presented to: patient  Follow-up services arranged:  Home Health Home Health Agency: [facility]         Patient response to transportation need: Is the patient able to respond to transportation needs?: Yes In the past 12 months, has lack of transportation kept you from medical appointments or from getting medications?: No In the past 12 months, has lack of transportation kept you from meetings, work, or from getting things needed for daily living?: No    Comments (or additional information):  Patient/Family verbalized understanding of follow-up arrangements:  Yes  Individual responsible for coordination of the follow-up plan: self  Confirmed correct DME delivered: Andria Rhein 12/10/2021    Andria Rhein

## 2021-12-10 NOTE — Progress Notes (Signed)
PROGRESS NOTE   Subjective/Complaints:   ROS: Denies fever, CP, SOB, Abd pain, cough, or visual changes.  Objective:   No results found. Recent Labs    12/10/21 0754  WBC 14.8*  HGB 9.3*  HCT 29.2*  PLT 643*    Recent Labs    12/10/21 0754  NA 129*  K 3.5  CL 92*  CO2 27  GLUCOSE 126*  BUN 7*  CREATININE 0.41*  CALCIUM 8.4*     Intake/Output Summary (Last 24 hours) at 12/10/2021 0950 Last data filed at 12/10/2021 0726 Gross per 24 hour  Intake 939 ml  Output --  Net 939 ml      Pressure Injury 11/03/21 Coccyx Medial Stage 2 -  Partial thickness loss of dermis presenting as a shallow open injury with a red, pink wound bed without slough. (Active)  11/03/21 1556  Location: Coccyx  Location Orientation: Medial  Staging: Stage 2 -  Partial thickness loss of dermis presenting as a shallow open injury with a red, pink wound bed without slough.  Wound Description (Comments):   Present on Admission:     Physical Exam: Vital Signs Blood pressure (!) 104/59, pulse (!) 103, temperature 97.8 F (36.6 C), resp. rate 14, height 5\' 1"  (1.549 m), weight 45.4 kg, SpO2 94 %.  s are 2+ Psych: Pt's affect is appropriate. Pt is cooperative Skin: Warm and dry, stage II coccyx 1 cm with fibronecrotic debris in center.  Also has reddened area about 2 cm x 2 cm on left buttock with skin intact, no pressure ulcer. Neuro: CN II-XII intact, motor strength 4/5 bilateral deltoid, bicep, triceps, grip, trace hip flexor, knee extensors, 0/5 ankle dorsiflexor and trace plantar flexor. Sensory: Mildly reduced sensation to LT in feet, propioception intact-no change. Musculoskeletal: FROM in UE, limited AROM in LE due to weakness. No joint swelling.   Assessment/Plan: 1. Functional deficits which require 3+ hours per day of interdisciplinary therapy in a comprehensive inpatient rehab setting. Physiatrist is providing close team  supervision and 24 hour management of active medical problems listed below. Physiatrist and rehab team continue to assess barriers to discharge/monitor patient progress toward functional and medical goals  Care Tool:  Bathing    Body parts bathed by patient: Right arm, Chest, Left arm, Abdomen, Right upper leg, Left upper leg, Right lower leg, Left lower leg, Face, Buttocks, Front perineal area   Body parts bathed by helper: Buttocks     Bathing assist Assist Level: Supervision/Verbal cueing     Upper Body Dressing/Undressing Upper body dressing   What is the patient wearing?: Pull over shirt    Upper body assist Assist Level: Supervision/Verbal cueing    Lower Body Dressing/Undressing Lower body dressing      What is the patient wearing?: Incontinence brief, Pants     Lower body assist Assist for lower body dressing: Minimal Assistance - Patient > 75%     Toileting Toileting    Toileting assist Assist for toileting: Maximal Assistance - Patient 25 - 49%     Transfers Chair/bed transfer  Transfers assist  Chair/bed transfer activity did not occur: N/A  Chair/bed transfer assist level: Contact Guard/Touching assist  Locomotion Ambulation   Ambulation assist   Ambulation activity did not occur: Safety/medical concerns  Assist level: Maximal Assistance - Patient 25 - 49% Assistive device: Fara Boros Max distance: 12 ft   Walk 10 feet activity   Assist  Walk 10 feet activity did not occur: Safety/medical concerns        Walk 50 feet activity   Assist Walk 50 feet with 2 turns activity did not occur: Safety/medical concerns         Walk 150 feet activity   Assist Walk 150 feet activity did not occur: Safety/medical concerns         Walk 10 feet on uneven surface  activity   Assist Walk 10 feet on uneven surfaces activity did not occur: Safety/medical concerns         Wheelchair     Assist Is the patient using a  wheelchair?: Yes Type of Wheelchair: Manual Wheelchair activity did not occur: Safety/medical concerns  Wheelchair assist level: Supervision/Verbal cueing Max wheelchair distance: 150 ft    Wheelchair 50 feet with 2 turns activity    Assist    Wheelchair 50 feet with 2 turns activity did not occur: Safety/medical concerns   Assist Level: Contact Guard/Touching assist   Wheelchair 150 feet activity     Assist  Wheelchair 150 feet activity did not occur: Safety/medical concerns   Assist Level: Supervision/Verbal cueing   Blood pressure (!) 104/59, pulse (!) 103, temperature 97.8 F (36.6 C), resp. rate 14, height 5\' 1"  (1.549 m), weight 45.4 kg, SpO2 94 %.  Medical Problem List and Plan: 1. 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  Antithrombotics: -DVT/anticoagulation:  Pharmaceutical: Lovenox             -antiplatelet therapy: N/a 3. Pain:  Managed by Dr.Phillips. Off Methadone and Oxy IR now on tramadol- 7/1 Doing okay off methadone, Oxy IR and currently on tramadol 7/2 No pain issues today. 4. Mood: Team to provide ego support/encouragement. LCSW to follow for evaluation and support.              -antipsychotic agents:  N/A 5. Neuropsych: This patient is capable of making decisions on her own behalf. 6. Stage 2 decubitus ulcer: Routine pressure relief measures.              --continue air mattress overlay for MASD/Stage 2 decub.   6/11 begin medihoney to wound daily 7/1 Medihoney doing great on rt buttock wound.  Redness 2 cm x 2 cm on left buttuck but no pressure ulcer yet, encourage q2 hr turns. 7/2 Saw dressing change with nurse, healing well.  Skin intact without redness of left buttock. 7. Fluids/Electrolytes/Nutrition: Strict I/O. Continue regular diet             Hypo K improved after supplementation, will d/c KCL recheck on Friday ,     Latest Ref Rng & Units 12/10/2021    7:54 AM 12/06/2021    5:10 AM 12/04/2021    6:38 AM  BMP  Glucose 70 - 99 mg/dL 12/06/2021   84   BUN 8 - 23 mg/dL 7   7   Creatinine 716 - 1.00 mg/dL 9.67  0.30   Sodium 135 - 145 mmol/L 129   132   Potassium 3.5 - 5.1 mmol/L 3.5  3.6  3.8   Chloride 98 - 111 mmol/L 92   98   CO2 22 - 32 mmol/L 27   26   Calcium 8.9 - 10.3 mg/dL 8.4   8.5   Needs low dose KCL daily supplement  7/2 Continue low dose Kcl 10 mEq BID, trend with weekly labs 8. COPD  --Continue Singulair, Yuperi, Brovana and Pulmicort nebs.  -6/16 Decannulated 7/2 Taking Mucinex along with nebs.  Some yellow sputum production last night and this morning, lung sounds clear, no distress, afebrile, and WBC's trending down over past few weeks. Discussed with Dr. Dagoberto Ligas, MD, no CXR recommended at this time, but continue to monitor. Pt with chronic allergic rhinitis resume flonase  9. Leucocytosis:     Latest Ref Rng & Units 12/10/2021    7:54 AM 12/03/2021    6:05 AM 11/26/2021    6:16 AM  CBC  WBC 4.0 - 10.5 K/uL 14.8  13.5  15.2   Hemoglobin 12.0 - 15.0 g/dL 9.3  9.9  10.4   Hematocrit 36.0 - 46.0 % 29.2  31.4  31.8   Platelets 150 - 400 K/uL 643  481  480   Slightly up but afeb 10. Panic attacks/Anxiety d/o: Klonopin weaned down 05/30 to  0.5 mg TID.6/16 down to .25mg  TID , D/C ativan, resume prn xanax             --Seroquel Reduced to 12.5 mg BID  -  11. Cardiomyopathy: Continue Metoprolol 12.5 mg bid and Crestor.              --aldactone added 06/07-->monitor for orthostatic symptoms.  No sign of failure 6/25 12. Ileus/intermittent issues w/N/V: Has been refusing miralax and senna intermittently. Will decrease to Miralax to daily w/Senna S daily             --Keep Mg>2.0 and K>4.0 for adequate supplementation/recurrence.          K+ improved still requiring supplements, wnl 6/30             --add Mg for  supplement.  13. Pulmonary nodules: PCCM recommends repeat CT 6-8 weeks from 10/28/21 to monitor for resolution. Will repeat prior to d/c since stay extended  14. Interstitial cystitis s/p cystectomy and fistula repair: Followed by Dr.Evans.   62.  Dispo discussed need for assistance post d/c , husband reportedly undergoing chemotherapy treatment recently told he has Stage 4 , discuss in conf Wed , pt is anxious about care needs , has other family members who can assist  63. UTI  -U/A ordered, >50 wbc, bacterial, nitrates  -Keflex 250mg  q6h ordered for 5 days, await culture, +proteus 100K, s to Keflex  7/1 Keflex course completed 17. Hyponatremia, appears to be chronic, may be related to spironolactone       Latest Ref Rng & Units 12/10/2021    7:54 AM 12/06/2021    5:10 AM 12/04/2021    6:38 AM  BMP  Glucose 70 - 99 mg/dL 126   84   BUN 8 - 23 mg/dL 7   7   Creatinine 0.44 - 1.00 mg/dL 0.41   0.30   Sodium 135 - 145 mmol/L 129   132   Potassium 3.5 - 5.1 mmol/L 3.5  3.6  3.8   Chloride 98 - 111 mmol/L 92   98   CO2 22 -  32 mmol/L 27   26   Calcium 8.9 - 10.3 mg/dL 8.4   8.5   Range upper 120s to low 130s  18.  Bowel regulation had ileus which resolved, , had BMs yesterday but not thus far today , on senna S 3 BID will reduce to 2 tabs,  LBM 12/08/21 may need dulc supp in am if no BM today  19.  Tremors, not clearly anxiety related may be withdrawal from Oxy IR, pain controlled by Tramadol (which can cause tremors in <10%), did not get klonopin yet this am , did get xanax at ~2am 7/1 Continue to monitor, intermittent 7/2 No tremors noted or reported today. LOS: 26 days A FACE TO FACE EVALUATION WAS PERFORMED  Charlett Blake 12/10/2021, 9:50 AM

## 2021-12-10 NOTE — Progress Notes (Signed)
Occupational Therapy Weekly Progress Note  Patient Details  Name: [name] MRN: [id number] Date of Birth: 11/30/1955  Beginning of progress report period: November 29, 2021 End of progress report period: December 10, 2021  Session 1: OT Individual Time: 1015-1100 OT Individual Time Calculation (min): 45 min  Session 2:  OT Individual Time: 1330-1430 OT Individual Time Calculation (min): 60 min    Patient has met all short term goals demonstrating improvement with bathing, dressing, toileting, functional transfers, and sit to stand technique. Family training has been completed with Sister, Adult nurse. Recommended DME has been ordered and received.   Patient continues to demonstrate the following deficits: muscle weakness, activity tolerance/endurance, and decreased standing balance and therefore will continue to benefit from skilled OT intervention to enhance overall performance with BADL and Reduce care partner burden.  Patient progressing toward long term goals..  Continue plan of care.  OT Short Term Goals STGs = LTGS due to planned discharge on 12/12/21.  Skilled Therapeutic Interventions/Progress Updates:    Session1: S: Patient agreeable to participate in OT session. Reports 0/10 pain level.    Patient participated in skilled OT session focusing on bilateral coordination, fine motor coordination and improving activity tolerance. Therapist facilitated session with patient participating in [facility] [employer] game while wearing bilateral 1/2lb wrist weights to progress difficulty level in order to improve activity tolerance and tolerance and fine motor coordination in order to improve patient's overall functional performance when participating in self care tasks such as bathing, dressing, and transfers.  Pt provided with wrist weights this session to progress task and provide additional challenge. VC for form and technique were provided during task as needed.     Session 2: S: Patient  agreeable to participate in OT session. Reports 0/10 pain level. Pt voices recent placement of IV in her right elbow which causes pain with movement and she is worried that it will cause trouble in the shower.   Patient participated in skilled OT session focusing on ADL re-training while bathing in shower using tub bench, LHS, and grab bar. Therapist integrated core stability, BUE strength, activity tolerance, and gross motor coordination during bathing, dressing, and grooming tasks in order to improve patient's functional performance during self care tasks when at home with intermittent caregiver assist. Pt provided with CGA during functional transfer from bed to w/c and w/c to tub bench, and back to w/c then bed. RW was used when performing bed to w/c and back. VC were provided for form and technique for increased safety and judgement. LB bathing was completed seated using LHS. Pt performed lateral lean to the right in order to fully wash all areas. UB bathing was completed with SBA while seated. Once seated at sink level in w/c, patient completed grooming with Mod I. UB dressing completed independently and LB dressing completed at Mod A due to not using AE and completing in bed while rolling side to side.   Therapy Documentation Precautions:  Precautions Precautions: Fall Precaution Comments: trach stoma,  B foot drop Restrictions Weight Bearing Restrictions: No  Therapy/Group: Individual Therapy  Ailene Ravel, OTR/L,CBIS  Supplemental OT - [place] and [facility]  12/10/2021, 3:36 PM

## 2021-12-10 NOTE — Progress Notes (Signed)
Physical Therapy Session Note  Patient Details  Name: [name] MRN: [id number] Date of Birth: September 28, 1955  Today's Date: 12/10/2021 PT Individual Time: 2297-9892 PT Individual Time Calculation (min): 74 min   Short Term Goals: Week 2:  PT Short Term Goal 1 (Week 2): Pt will ambulate 37f with mod assist and LRAD with +2 for WC follow PT Short Term Goal 1 - Progress (Week 2): Met PT Short Term Goal 2 (Week 2): Pt will propell  WC >1542fwith supervision assist PT Short Term Goal 2 - Progress (Week 2): Met PT Short Term Goal 3 (Week 2): Pt [facility] assist consistently with LRAD PT Short Term Goal 3 - Progress (Week 2): Met PT Short Term Goal 4 (Week 2): Pt will perform 5xSTS PT Short Term Goal 4 - Progress (Week 2): Met Week 3:  PT Short Term Goal 1 (Week 3): Pt will ambulate 2018fith min assist and LRAD PT Short Term Goal 2 (Week 3): Pt will transfer to and [facility] assist consistently PT Short Term Goal 3 (Week 3): Pt will propell WC >150f81fthout assist PT Short Term Goal 4 (Week 3): Family education will be initiated.  Skilled Therapeutic Interventions/Progress Updates:  Patient supine in room on entrance to room and completing lunch. Patient alert and agreeable to PT session.   Patient with no pain complaint throughout session. Fatigue noted with increased movement and slight improvement in activity tolerance.  Therapeutic Activity: Bed Mobility: Patient performed supine --> sit with Mod I. At end of session, pt requires MinA to bring BLE to bed surfaces. VC/ tc required for Bil feet to bed. Transfers: Patient performed sit<>stand and stand pivot transfers throughout session with CGA/ supervision. Continues to demo weakness in BLE, however has significantly improved since last seeing pt 5 days ago. Provided verbal cues for technique. Squat pivot requires ModA to clear seat. Stand pivot completed with CGA. Pt relating improved safety with  stand pivot.   Gait Training:  Prosthetist present delivering bil AFOs and assisting with amb assessment and w/c follow. New shoes provided by sister do not allow for ease of donning/ doffing with AFO present as tongue of shoe is not separate from upper. AFOs donned MaxA with education on donning/ doffing. Patient ambulated 45' 32/ 65' 70 with supervision/ CGA and w/c follow for fatigue. Demonstrated intermittent toe catch on R foot. Prosthetist will be able to follow pt if toe cap required even after d/c. Provided vc/ tc for  increasing step height on RLE with pt able to self correct 75% of time.   Wheelchair Mobility:  Patient propelled wheelchair 150' x1 with supervision. Is able to park, lock brakes with close supervision. Requires MinA for feet on/ off leg rests. CGA to MinA to move legrests out of way. Provided vc for technique throughout parts management.   Neuromuscular Re-ed: NMR facilitated during session with focus on standing balance and muscle fiber recruitment/ activation. Pt guided in toe taps to 3" steps. Is able to complete 3 reps on each LE in first standing bout requiring MinA to reach R foot to step. Second bout x5 each LE with CGA to reach RLE to step. Sit <> stand technique improved with Bil foot positioning prior to stance for improved initial balance over feet without repositioning.  NMR performed for improvements in motor control and coordination, balance, sequencing, judgement, and self confidence/ efficacy in performing all aspects of mobility at highest level  of independence.   Patient supine  in bed at end of session with brakes locked, and all needs within reach.   Therapy Documentation Precautions:  Precautions Precautions: Fall Precaution Comments: trach stoma,  B foot drop Restrictions Weight Bearing Restrictions: No General:   Vital Signs: Therapy Vitals Temp: 97.8 F (36.6 C) Temp Source: Oral Pulse Rate: 94 Resp: 19 BP: 98/65 Patient Position (if  appropriate): Lying Oxygen Therapy SpO2: 93 % O2 Device: Room Air Pain:    Therapy/Group: Individual Therapy  Alger Simons PT, DPT, CSRS 12/10/2021, 5:36 PM

## 2021-12-11 ENCOUNTER — Encounter (HOSPITAL_COMMUNITY): Payer: Medicare Other

## 2021-12-11 ENCOUNTER — Telehealth: Payer: Self-pay | Admitting: Pulmonary Disease

## 2021-12-11 MED ORDER — AMOXICILLIN-POT CLAVULANATE 400-57 MG/5ML PO SUSR
875.0000 mg | Freq: Two times a day (BID) | ORAL | Status: DC
  Administered 2021-12-11 – 2021-12-12 (×2): 875 mg via ORAL
  Filled 2021-12-11 (×3): qty 10.9

## 2021-12-11 MED ORDER — SENNOSIDES-DOCUSATE SODIUM 8.6-50 MG PO TABS
3.0000 | ORAL_TABLET | Freq: Every day | ORAL | Status: DC
  Administered 2021-12-11: 3 via ORAL
  Filled 2021-12-11: qty 3

## 2021-12-11 MED ORDER — DOCUSATE SODIUM 50 MG/5ML PO LIQD: 150.0000 mg | Freq: Every day | ORAL | Status: DC

## 2021-12-11 MED ORDER — SENNOSIDES 8.8 MG/5ML PO SYRP
15.0000 mL | ORAL_SOLUTION | Freq: Every day | ORAL | Status: DC
  Filled 2021-12-11: qty 15

## 2021-12-11 NOTE — Progress Notes (Signed)
Received pt in bed, stable, pain free, repositioned for comfort.

## 2021-12-11 NOTE — Progress Notes (Signed)
Occupational Therapy Session Note  Patient Details  Name: [name] MRN: [id number] Date of Birth: 1956/01/31  Today's Date: 12/11/2021 OT Individual Time: 0920-1033 OT Individual Time Calculation (min): 73 min + 55 min   Short Term Goals: Week 1:  OT Short Term Goal 1 (Week 1): Pt will complete toilet transfer with max A and LRAD. OT Short Term Goal 1 - Progress (Week 1): Met OT Short Term Goal 2 (Week 1): Pt will complete sit to stand with min A of 2 and LRAD in prep for standing ADL. OT Short Term Goal 2 - Progress (Week 1): Not met OT Short Term Goal 3 (Week 1): Pt will don pants with max A of 1. OT Short Term Goal 3 - Progress (Week 1): Met Week 2:  OT Short Term Goal 1 (Week 2): Pt will complete sit to stand with min A of 2 and LRAD in prep for standing ADL. OT Short Term Goal 1 - Progress (Week 2): Met OT Short Term Goal 2 (Week 2): Pt will complete 1/3 toileting tasks with mod A. OT Short Term Goal 2 - Progress (Week 2): Met OT Short Term Goal 3 (Week 2): Pt will complete standing grooming task at sink with max A. OT Short Term Goal 3 - Progress (Week 2): Not met OT Short Term Goal 4 (Week 2): Pt will don pants with mod A. OT Short Term Goal 4 - Progress (Week 2): Not met Week 3:  OT Short Term Goal 1 (Week 3): STG = LTG 2/2 ELOS Week 4:  OT Short Term Goal 1 (Week 4): STGs = LTGS due to planned discharge on 12/12/21.  Skilled Therapeutic Interventions/Progress Updates:   Session 1 [phone]): Pt received semi-reclined in bed, discomfort with R forearm IV, but, agreeable to therapy. Session focus on self-care retraining, activity tolerance, BUE/BLE strengthening in prep for improved ADL/IADL/func mobility performance + decreased caregiver burden.  Supine > sitting EOB mod I with use of bed rail, mod I (increased time) sitting EOB. Attempted to don B gripper socks EOB, required max A overall, assist to achieve figure 4 position and to initiate/terminate pulling socks up.  Stand-pivot to w/c on her L no AD close S. Completed seated grooming mod I.   MD in/out morning rounds.  Pt with episode of dry heaving, RN later present to administer nausea medication.  Donned pants with min A to thread BLE, CGA for standing balance as she pulled up pants over hips one hand at a time. Total A to don B shoes and AFOs. Dep w/c transport to and from gym for time management/energy conservation. Weight retrieved per RN request.  Ambulatory transfer with RW > nustep with CGA. Completed 2.5 min on nustep at level 3/10 resistance. HR at 114-120 throughout session bpm. Rec therapist present partially for session to engage in discussion around occupational interests including pet/horse care, pt appreciate and affect improved.  Pt left seated in w/c with call bell in reach, and all immediate needs met, awaiting following PT session.  Session 2 [phone]): Pt received semi-reclined in bed, reports being very fatigued after walking 120 ft with PT and declining shower, but, agreeable to therapy. Session focus on activity tolerance, BUE/BLE strengthening in prep for improved ADL/IADL/func mobility performance + decreased caregiver burden.  Came to sitting EOB mod I with use of bed rail. Total A to don B shoes/AFO. Short ambulatory transfers throughout session CGA with RW and B AFOs. Dep w/c transfer > gym time management /  energy conservation.  Issued the following BUE HEP to promote improved functional strength/activity tolerance upon DC, pt able to return demo 1x10 of the following with medium resistance theraband:   Exercises - Seated Elbow Flexion with Self-Anchored Resistance  - 1 x daily - 7 x weekly - 3 sets - 10 reps - Elbow Extension with Anchored Resistance  - 1 x daily - 7 x weekly - 3 sets - 10 reps - Seated Bruegger with Resistance  - 1 x daily - 7 x weekly - 3 sets - 10 reps - Shoulder Flexion Serratus Activation with Resistance  - 1 x daily - 7 x weekly - 3 sets - 10  reps  Pt also able to completed 1x10: B knee flexion/extension, hip abduction with medium resistive theraband.   Demod use of AE including sock aid, reacher, LH shoe horn to improve ind with LBD. Pt reports having previously had reacher and interested in using again. Also discussed pet care from w/c level at home, but pt reports currently having assistance to take of dogs/horse.  Pt able to doff pants bed level mod A to remove from lower BLE and apply lotion to upper BLE, assist to reach lower BLE.   Pt left semi-reclined in bed call bell in reach, and all immediate needs met.   Therapy Documentation Precautions:  Precautions Precautions: Fall Precaution Comments: B foot frop Restrictions Weight Bearing Restrictions: No   Pain: see session notes, RUE IV discomfort ADL: See Care Tool for more details.   Therapy/Group: Individual Therapy  Volanda Napoleon MS, OTR/L  12/11/2021, 6:52 AM

## 2021-12-11 NOTE — Progress Notes (Signed)
Patient anxious first part of shift, R/T CT scan. PRN xanax and trazodone 50mg 's given per patient's request at 2333. PRN dulcolax supp given at 2213, with moderate results. 2214 A

## 2021-12-11 NOTE — Progress Notes (Signed)
NAME:  [name], MRN:  [id number], DOB:  Jul 24, 1955, LOS: 27 ADMISSION DATE:  11/14/2021, CONSULTATION DATE:  4/18 REFERRING MD:  Tat/ triad, CHIEF COMPLAINT:  resp distress    History of Present Illness:  66 y.o. female quit smoking 2021 with  GOLD 3 COPD MZ, uncontrolled anxiety. Was on 02 one year PTA but "took herself off" per friend at bedside and able to care for pets at home but mostly housebound sinc ethen  She was admitted 4/17 to [facility], ICU with acute hypoxic/hypercarbic respiratory failure, failed BiPAP and required mechanical ventilation, transferred to [facility] 4/21 due to new finding of LV dysfunction.  Significant Hospital Events: Including procedures, antibiotic start and stop dates in addition to other pertinent events   ET  4/18 c/b hypotension with severe air trapping  Echo 4/20 with EF 40% , akinesis of apex and septal wall 4/24 CTA head negative for any significant abnormality 4/25 ketamine added 4/28 increased work of breathing during the wean, severe auto PEEP 4/29 Febrile 101.2 5/1 perc trach placement 5/15 trach changed to Shiley #6 cuffless 5/16 antibiotics resumed for inreasing resp distress, leukocytosis, increased LA 5/30 ileus/SBO 6/14 reevaluated in CIR  >>trach capping 7/4 reevaluation after CT scan with some areas of improved pneumonia and some areas of worsening particularly right upper lobe and left upper lobe with appears to be development of massive fibrosis  Interim History / Subjective:  She feels okay.  Increased cough history of production about 1 week.  Some chest tightness on the right.  Denies any fever.  None charted.  Denies any coughing with eating or drinking.  Denies any coughing fits at night.  However he does endorse some difficulty swallowing certain pills etc. Objective   Blood pressure (!) 105/58, pulse (!) 107, temperature 98 F (36.7 C), resp. rate 16, height 5\' 1"  (1.549 m), weight 45.7 kg, SpO2 95 %.        Intake/Output  Summary (Last 24 hours) at 12/11/2021 1757 Last data filed at 12/11/2021 1348 Gross per 24 hour  Intake 117 ml  Output --  Net 117 ml    Filed Weights   12/09/21 0800 12/11/21 0800  Weight: 45.4 kg 45.7 kg   Examination: Thin woman in NAD Lungs are clear, no wheeze Ext warm Mild tachycardia, regular rhythm  CT chest 10/27/2021 reviewed with scattered groundglass nodular opacities with more dense upper lobe nodular opacities right greater than left when compared to CT chest 12/10/2021 shows areas of improved aeration, improved infiltrates in middle and lower lobes with increase in infiltrates right upper lobe and left upper lobe with prior nodular infiltrate showing signs of linear bandlike scarring, significant severe emphysematous changes present on both  Assessment & Plan:   Abnormal CT scan: More recent CT 10/2021 with right upper lobe nodule needing follow-up.  CT scan obtained yesterday demonstrates that nodular opacity appears to be involved in the bandlike fibrosis with similar appearance on the left, worsening airspace disease right upper lobe and left upper lobe with relative clearing in the middle and lower lobes.  High suspicion for intermittent aspiration.  Particular the posterior element of the right upper lobe.  Or could just represent ongoing healing and development of scar after significant pneumonias while in the ICU.  Favor treatment with antibiotics given worsening cough over the last week or so.  Possible development of COPD exacerbation (severe fixed obstruction demonstrated PFTs 10/2019 on my review and interpretation).  However she is not dyspneic, no  wheeze, no oxygen use.  Okay to hold off on steroids for now. --Augmentin twice daily for 14 days --We will arrange outpatient follow-up with Dr. Delton Coombes, primary pulmonologist, for decision on repeat imaging, favor 6-week interval given high suspicion for infectious etiology, radiology recommends 3 months, defer to outpatient  provider regarding timing

## 2021-12-11 NOTE — Progress Notes (Signed)
PROGRESS NOTE   Subjective/Complaints:  Pt concerned about CT chest this was reviewed No new cough or SOB ROS: + Constipation , no abd pain no N/V  Objective:   CT CHEST W CONTRAST  Result Date: 12/10/2021 CLINICAL DATA:  Abnormal xray - lung nodule, < 1 cm, mod-high risk EXAM: CT CHEST WITH CONTRAST TECHNIQUE: Multidetector CT imaging of the chest was performed during intravenous contrast administration. RADIATION DOSE REDUCTION: This exam was performed according to the departmental dose-optimization program which includes automated exposure control, adjustment of the mA and/or kV according to patient size and/or use of iterative reconstruction technique. CONTRAST:  26mL OMNIPAQUE IOHEXOL 300 MG/ML  SOLN COMPARISON:  Chest radiograph and CT 10/28/2021 FINDINGS: Cardiovascular: Atherosclerosis of the thoracic aorta. No aortic aneurysm. No central pulmonary embolus. The heart is normal in size. No pericardial effusion. Mediastinum/Nodes: Scattered small mediastinal lymph nodes are all subcentimeter short axis. For example AP window node measures 7 mm series 3, image 47. 10 mm right hilar node. The soft a Gus is decompressed, previous enteric tube is been removed. De-cannulated tracheostomy tube. Lungs/Pleura: Bandlike and consolidative right upper lobe airspace disease, some of which is low-density, obscuring the previous 11 mm right upper lobe nodule. There is progression in airspace disease from prior CT. There is improvement in the paramediastinal left upper lobe nodular airspace process. However there is increasing/new peripheral consolidation in the perifissural as well as apical left upper lobe. Decreased size of right lower lobe nodule, currently 5 mm series 5, image 103, previously 7 mm by my retrospective measurement. Small perifissural nodular densities in the right upper lobe series 5, image 83 have improved. The more diffuse tree-in-bud  opacities on prior CT have resolved. There is moderate background emphysema. No pleural effusion. Upper Abdomen: Advanced hepatic steatosis. Prominence of the left renal pelvis is partially included, but was likely present on prior exam. There may be mild left urothelial enhancement. Musculoskeletal: There are no acute or suspicious osseous abnormalities. IMPRESSION: 1. Bandlike and consolidative right upper lobe airspace disease has progressed from prior exam, some of which is low-density, obscuring the previous 11 mm right upper lobe nodule. Given the low-density lipoid pneumonia is considered. Continued CT follow-up is recommended, perhaps in 2-3 months. 2. Improvement in the paramediastinal left upper lobe nodular airspace process. However there is increasing/new peripheral consolidation in the perifissural as well as apical left upper lobe, similar in appearance in the density to the right upper lobe process. 3. Lower lobe nodular densities have improved suggesting resolving infectious or inflammatory process. 4. The more diffuse tree-in-bud opacities on prior CT have resolved. 5. Moderate emphysema and bronchial thickening. 6. Shotty mediastinal and right hilar lymph nodes are nonspecific but likely reactive. 7. Advanced hepatic steatosis. 8. Prominence of the left renal pelvis is partially included, but was likely present on prior exam. There may be mild left urothelial enhancement. Recommend correlation with urinalysis. Aortic Atherosclerosis (ICD10-I70.0) and Emphysema (ICD10-J43.9). Electronically Signed   By: Keith Rake M.D.   On: 12/10/2021 20:57   Recent Labs    12/10/21 0754  WBC 14.8*  HGB 9.3*  HCT 29.2*  PLT 643*     Recent Labs  12/10/21 0754  NA 129*  K 3.5  CL 92*  CO2 27  GLUCOSE 126*  BUN 7*  CREATININE 0.41*  CALCIUM 8.4*      Intake/Output Summary (Last 24 hours) at 12/11/2021 0942 Last data filed at 12/10/2021 1743 Gross per 24 hour  Intake 474 ml  Output --   Net 474 ml      Pressure Injury 11/03/21 Coccyx Medial Stage 2 -  Partial thickness loss of dermis presenting as a shallow open injury with a red, pink wound bed without slough. (Active)  11/03/21 1556  Location: Coccyx  Location Orientation: Medial  Staging: Stage 2 -  Partial thickness loss of dermis presenting as a shallow open injury with a red, pink wound bed without slough.  Wound Description (Comments):   Present on Admission:     Physical Exam: Vital Signs Blood pressure 107/60, pulse 93, temperature (!) 97.5 F (36.4 C), resp. rate 15, height 5\' 1"  (1.549 m), weight 45.4 kg, SpO2 95 %.   General: No acute distress Mood and affect are appropriate Heart: Regular rate and rhythm no rubs murmurs or extra sounds Lungs: Clear to auscultation, breathing unlabored, no rales or wheezes Abdomen: Positive bowel sounds, soft nontender to palpation, nondistended Extremities: No clubbing, cyanosis, or edema   Psych: Pt's affect is appropriate. Pt is cooperative Skin: Warm and dry, stage II coccyx 1 cm with fibronecrotic debris in center.  Also has reddened area about 2 cm x 2 cm on left buttock with skin intact, no pressure ulcer. Neuro: CN II-XII intact, motor strength 4/5 bilateral deltoid, bicep, triceps, grip, trace hip flexor, knee extensors, 0/5 ankle dorsiflexor and trace plantar flexor. Sensory: Mildly reduced sensation to LT in feet, propioception intact-no change. Musculoskeletal: FROM in UE, limited AROM in LE due to weakness. No joint swelling.   Assessment/Plan: 1. Functional deficits which require 3+ hours per day of interdisciplinary therapy in a comprehensive inpatient rehab setting. Physiatrist is providing close team supervision and 24 hour management of active medical problems listed below. Physiatrist and rehab team continue to assess barriers to discharge/monitor patient progress toward functional and medical goals  Care Tool:  Bathing    Body parts bathed  by patient: Right arm, Left arm, Front perineal area, Abdomen, Chest, Buttocks, Right upper leg, Left upper leg, Right lower leg, Left lower leg, Face   Body parts bathed by helper: Buttocks     Bathing assist Assist Level: Supervision/Verbal cueing     Upper Body Dressing/Undressing Upper body dressing   What is the patient wearing?: Pull over shirt    Upper body assist Assist Level: Set up assist    Lower Body Dressing/Undressing Lower body dressing      What is the patient wearing?: Incontinence brief, Pants     Lower body assist Assist for lower body dressing: Minimal Assistance - Patient > 75%     Toileting Toileting    Toileting assist Assist for toileting: Moderate Assistance - Patient 50 - 74%     Transfers Chair/bed transfer  Transfers assist  Chair/bed transfer activity did not occur: N/A  Chair/bed transfer assist level: Contact Guard/Touching assist     Locomotion Ambulation   Ambulation assist   Ambulation activity did not occur: Safety/medical concerns  Assist level: Maximal Assistance - Patient 25 - 49% Assistive device: Ethelene Hal Max distance: 12 ft   Walk 10 feet activity   Assist  Walk 10 feet activity did not occur: Safety/medical concerns  Walk 50 feet activity   Assist Walk 50 feet with 2 turns activity did not occur: Safety/medical concerns         Walk 150 feet activity   Assist Walk 150 feet activity did not occur: Safety/medical concerns         Walk 10 feet on uneven surface  activity   Assist Walk 10 feet on uneven surfaces activity did not occur: Safety/medical concerns         Wheelchair     Assist Is the patient using a wheelchair?: Yes Type of Wheelchair: Manual Wheelchair activity did not occur: Safety/medical concerns  Wheelchair assist level: Supervision/Verbal cueing Max wheelchair distance: 150 ft    Wheelchair 50 feet with 2 turns activity    Assist    Wheelchair 50  feet with 2 turns activity did not occur: Safety/medical concerns   Assist Level: Contact Guard/Touching assist   Wheelchair 150 feet activity     Assist  Wheelchair 150 feet activity did not occur: Safety/medical concerns   Assist Level: Supervision/Verbal cueing   Blood pressure 107/60, pulse 93, temperature (!) 97.5 F (36.4 C), resp. rate 15, height 5\' 1"  (1.549 m), weight 45.4 kg, SpO2 95 %.  Medical Problem List and Plan: 1. 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  Antithrombotics: -DVT/anticoagulation:  Pharmaceutical: Lovenox             -antiplatelet therapy: N/a 3. Pain:  Managed by Dr.Phillips. Off Methadone and Oxy IR now on tramadol- 7/1 Doing okay off methadone, Oxy IR and currently on tramadol 50mg  QID, will d/c on this med and have pt f/u with her pain management  MD 7/2 No pain issues today. 4. Mood: Team to provide ego support/encouragement. LCSW to follow for evaluation and support.              -antipsychotic agents:  N/A 5. Neuropsych: This patient is capable of making decisions on her own behalf. 6. Stage 2 decubitus ulcer: Routine pressure relief measures.              --continue air mattress overlay for MASD/Stage 2 decub.   6/11 begin medihoney to wound daily 7/1 Medihoney doing great on rt buttock wound.  Redness 2 cm x 2 cm on left buttuck but no pressure ulcer yet, encourage q2 hr turns. 7/2 Saw dressing change with nurse, healing well.  Skin intact without redness of left buttock. 7. Fluids/Electrolytes/Nutrition: Strict I/O. Continue regular diet            Hypo K improved after supplementation, will d/c KCL recheck on Friday ,     Latest Ref Rng & Units 12/10/2021    7:54 AM 12/06/2021    5:10 AM 12/04/2021    6:38 AM  BMP  Glucose 70 - 99 mg/dL 126    84   BUN 8 - 23 mg/dL 7   7   Creatinine 0.44 - 1.00 mg/dL 0.41   0.30   Sodium 135 - 145 mmol/L 129   132   Potassium 3.5 - 5.1 mmol/L 3.5  3.6  3.8   Chloride 98 - 111 mmol/L 92   98   CO2 22 -  32 mmol/L 27   26   Calcium 8.9 - 10.3 mg/dL 8.4   8.5   Needs low dose KCL daily supplement  7/2 Continue low dose Kcl 10 mEq BID, trend with weekly labs 8. COPD  --Continue Singulair, Yuperi, Brovana and Pulmicort nebs.  -6/16 Decannulated 7/2 Taking Mucinex along with nebs.  Some yellow sputum production last night and this morning, lung sounds clear, no distress, afebrile, and WBC's trending down over past few weeks. Discussed with Dr. Berline Chough, MD, no CXR recommended at this time, but continue to monitor. Pt with chronic allergic rhinitis resume flonase  9. Leucocytosis:     Latest Ref Rng & Units 12/10/2021    7:54 AM 12/03/2021    6:05 AM 11/26/2021    6:16 AM  CBC  WBC 4.0 - 10.5 K/uL 14.8  13.5  15.2   Hemoglobin 12.0 - 15.0 g/dL 9.3  9.9  76.2   Hematocrit 36.0 - 46.0 % 29.2  31.4  31.8   Platelets 150 - 400 K/uL 643  481  480   Slightly up but afeb 10. Panic attacks/Anxiety d/o: Klonopin weaned down 05/30 to  0.5 mg TID.6/16 down to .25mg  TID , D/C ativan, resume prn xanax             --Seroquel Reduced to 12.5 mg BID  -  11. Cardiomyopathy: Continue Metoprolol 12.5 mg bid and Crestor.              --aldactone added 06/07-->monitor for orthostatic symptoms.  No sign of failure 6/25 12. Ileus/intermittent issues w/N/V: Has been refusing miralax and senna intermittently. Will decrease to Miralax to daily w/Senna S daily             --Keep Mg>2.0 and K>4.0 for adequate supplementation/recurrence.          K+ improved still requiring supplements, recheck in am              --add Mg for supplement.  13. Pulmonary nodules: PCCM recommends repeat CT 6-8 weeks from 10/28/21 to monitor for resolution. Nodules resolved some increased Upper lobe opacity mainly on right, prob post infectious  will as  PCCM to review and rec f/u , radiology rec f/u CT in 2-3 mo 14. Interstitial cystitis s/p cystectomy and fistula repair: Followed by Dr.Evans.   15.  Dispo discussed need for assistance post d/c , husband reportedly undergoing chemotherapy treatment recently told he has Stage 4 , discuss in conf Wed , pt is anxious about care needs , has other family members who can assist  16. UTI  -U/A ordered, >50 wbc, bacterial, nitrates  -Keflex 250mg  q6h ordered for 5 days, await culture, +proteus 100K, s to Keflex  7/1 Keflex course completed 17. Hyponatremia, appears to be chronic, may be related to spironolactone       Latest Ref Rng & Units 12/10/2021    7:54 AM 12/06/2021    5:10 AM 12/04/2021    6:38 AM  BMP  Glucose 70 - 99 mg/dL 12/08/2021   84   BUN 8 - 23 mg/dL 7   7   Creatinine 12/06/2021 - 1.00 mg/dL 831   5.17   Sodium 6.16 - 145 mmol/L 129   132   Potassium 3.5 - 5.1 mmol/L 3.5  3.6  3.8   Chloride 98 - 111 mmol/L 92   98   CO2 22 - 32 mmol/L 27   26   Calcium 8.9 - 10.3 mg/dL 8.4   8.5  Range upper 120s to low 130s asymptomatic recheck in am  18.  Bowel regulation had ileus which resolved, chronic constipation at home , took dulc tabs daily Increase  senna S 3 BID  LOS: 27 days A FACE TO FACE EVALUATION WAS PERFORMED  Erick Colace 12/11/2021, 9:42 AM

## 2021-12-11 NOTE — Plan of Care (Signed)
  Problem: RH Dressing Goal: LTG Patient will perform upper body dressing (OT) Description: LTG Patient will perform upper body dressing with assist, with/without cues (OT). Outcome: Not Met (add Reason) Note: Pt requires set-up A, assist to retrieve shirt.   Problem: RH Balance Goal: LTG Patient will maintain dynamic standing with ADLs (OT) Description: LTG:  Patient will maintain dynamic standing balance with assist during activities of daily living (OT)  Outcome: Completed/Met   Problem: Sit to Stand Goal: LTG:  Patient will perform sit to stand in prep for activites of daily living with assistance level (OT) Description: LTG:  Patient will perform sit to stand in prep for activites of daily living with assistance level (OT) Outcome: Completed/Met   Problem: RH Eating Goal: LTG Patient will perform eating w/assist, cues/equip (OT) Description: LTG: Patient will perform eating with assist, with/without cues using equipment (OT) Outcome: Completed/Met   Problem: RH Grooming Goal: LTG Patient will perform grooming w/assist,cues/equip (OT) Description: LTG: Patient will perform grooming with assist, with/without cues using equipment (OT) Outcome: Completed/Met   Problem: RH Bathing Goal: LTG Patient will bathe all body parts with assist levels (OT) Description: LTG: Patient will bathe all body parts with assist levels (OT) Outcome: Completed/Met   Problem: RH Dressing Goal: LTG Patient will perform lower body dressing w/assist (OT) Description: LTG: Patient will perform lower body dressing with assist, with/without cues in positioning using equipment (OT) Outcome: Completed/Met   Problem: RH Toileting Goal: LTG Patient will perform toileting task (3/3 steps) with assistance level (OT) Description: LTG: Patient will perform toileting task (3/3 steps) with assistance level (OT)  Outcome: Completed/Met   Problem: RH Toilet Transfers Goal: LTG Patient will perform toilet  transfers w/assist (OT) Description: LTG: Patient will perform toilet transfers with assist, with/without cues using equipment (OT) Outcome: Completed/Met   Problem: RH Tub/Shower Transfers Goal: LTG Patient will perform tub/shower transfers w/assist (OT) Description: LTG: Patient will perform tub/shower transfers with assist, with/without cues using equipment (OT) Outcome: Completed/Met

## 2021-12-11 NOTE — Progress Notes (Signed)
Pt requested to have her medications in liquid form due to difficulty swallowing pills.  Pharmacy notified.

## 2021-12-11 NOTE — Telephone Encounter (Signed)
Please schedule hospital follow-up with Dr. Delton Coombes in the next 2 to 4 weeks, prolonged and complicated hospital course including long ICU stay and tracheostomy now status post decannulation.  Ongoing abnormal imaging findings with need to discuss repeat imaging in the coming weeks.

## 2021-12-12 LAB — BASIC METABOLIC PANEL
Anion gap: 14 (ref 5–15)
BUN: 7 mg/dL — ABNORMAL LOW (ref 8–23)
CO2: 26 mmol/L (ref 22–32)
Calcium: 8.6 mg/dL — ABNORMAL LOW (ref 8.9–10.3)
Chloride: 93 mmol/L — ABNORMAL LOW (ref 98–111)
Creatinine, Ser: 0.35 mg/dL — ABNORMAL LOW (ref 0.44–1.00)
GFR, Estimated: >60 mL/min (ref >60)
Glucose, Bld: 98 mg/dL (ref 70–99)
Potassium: 3.7 mmol/L (ref 3.5–5.1)
Sodium: 133 mmol/L — ABNORMAL LOW (ref 135–145)

## 2021-12-12 MED ORDER — AMOXICILLIN-POT CLAVULANATE 400-57 MG/5ML PO SUSR: 875.0000 mg | Freq: Two times a day (BID) | ORAL | 0 refills | Status: DC

## 2021-12-12 MED ORDER — METOPROLOL TARTRATE 25 MG PO TABS: 12.5000 mg | ORAL_TABLET | Freq: Two times a day (BID) | ORAL | 0 refills | Status: DC

## 2021-12-12 MED ORDER — TRAMADOL HCL 50 MG PO TABS: 50.0000 mg | ORAL_TABLET | Freq: Three times a day (TID) | ORAL | 0 refills | Status: DC

## 2021-12-12 MED ORDER — TRAZODONE HCL 50 MG PO TABS: 25.0000 mg | ORAL_TABLET | Freq: Every evening | ORAL | 0 refills | Status: DC | PRN

## 2021-12-12 MED ORDER — ALPRAZOLAM 0.5 MG PO TABS
0.5000 mg | ORAL_TABLET | Freq: Every day | ORAL | 0 refills | Status: DC | PRN
Start: 2021-12-12 — End: 2022-09-24

## 2021-12-12 MED ORDER — POLYETHYLENE GLYCOL 3350 17 G PO PACK: 17.0000 g | PACK | Freq: Two times a day (BID) | ORAL | 0 refills | Status: DC

## 2021-12-12 MED ORDER — SENNOSIDES-DOCUSATE SODIUM 8.6-50 MG PO TABS: 2.0000 | ORAL_TABLET | Freq: Every day | ORAL | 0 refills | Status: DC

## 2021-12-12 MED ORDER — AMOXICILLIN-POT CLAVULANATE 400-57 MG/5ML PO SUSR: 875.0000 mg | Freq: Two times a day (BID) | ORAL | 0 refills | Status: AC

## 2021-12-12 MED ORDER — MEDIHONEY WOUND/BURN DRESSING EX PSTE: 1.0000 | PASTE | Freq: Every day | CUTANEOUS | 0 refills | Status: DC

## 2021-12-12 MED ORDER — ONDANSETRON HCL 4 MG PO TABS: 4.0000 mg | ORAL_TABLET | Freq: Two times a day (BID) | ORAL | 1 refills | Status: DC | PRN

## 2021-12-12 MED ORDER — MONTELUKAST SODIUM 10 MG PO TABS: 10.0000 mg | ORAL_TABLET | Freq: Every morning | ORAL | 0 refills | Status: DC

## 2021-12-12 MED ORDER — BREZTRI AEROSPHERE 160-9-4.8 MCG/ACT IN AERO: 2.0000 | INHALATION_SPRAY | Freq: Two times a day (BID) | RESPIRATORY_TRACT | 5 refills | Status: DC

## 2021-12-12 MED ORDER — PANTOPRAZOLE SODIUM 40 MG PO TBEC: 40.0000 mg | DELAYED_RELEASE_TABLET | Freq: Every day | ORAL | 0 refills | Status: DC

## 2021-12-12 MED ORDER — SPIRONOLACTONE 25 MG PO TABS: 25.0000 mg | ORAL_TABLET | Freq: Every day | ORAL | 0 refills | Status: DC

## 2021-12-12 MED ORDER — ROSUVASTATIN CALCIUM 20 MG PO TABS: 20.0000 mg | ORAL_TABLET | Freq: Every day | ORAL | 0 refills | Status: DC

## 2021-12-12 MED ORDER — POTASSIUM CHLORIDE CRYS ER 10 MEQ PO TBCR: 10.0000 meq | EXTENDED_RELEASE_TABLET | Freq: Two times a day (BID) | ORAL | 0 refills | Status: DC

## 2021-12-12 MED ORDER — ACETAMINOPHEN 325 MG PO TABS: 325.0000 mg | ORAL_TABLET | ORAL | Status: DC | PRN

## 2021-12-12 NOTE — Discharge Summary (Signed)
Physician Discharge Summary  Patient ID: [name] MRN: [id number] DOB/AGE: [id number] 64 y.o.  Admit date: 11/14/2021 Discharge date: 12/12/2021  Discharge Diagnoses:  Principal Problem:   Critical illness neuropathy (HCC) Active Problems:   Tobacco abuse   Chronic pain   Anxiety   Protein-calorie malnutrition, severe   Pressure injury of skin   Essential hypertension   Debility   Leucocytosis   Hyponatremia   Thrombocytosis   Discharged Condition: stable  Significant Diagnostic Studies: CT CHEST W CONTRAST  Result Date: 12/10/2021 CLINICAL DATA:  Abnormal xray - lung nodule, < 1 cm, mod-high risk EXAM: CT CHEST WITH CONTRAST TECHNIQUE: Multidetector CT imaging of the chest was performed during intravenous contrast administration. RADIATION DOSE REDUCTION: This exam was performed according to the departmental dose-optimization program which includes automated exposure control, adjustment of the mA and/or kV according to patient size and/or use of iterative reconstruction technique. CONTRAST:  54mL OMNIPAQUE IOHEXOL 300 MG/ML  SOLN COMPARISON:  Chest radiograph and CT 10/28/2021 FINDINGS: Cardiovascular: Atherosclerosis of the thoracic aorta. No aortic aneurysm. No central pulmonary embolus. The heart is normal in size. No pericardial effusion. Mediastinum/Nodes: Scattered small mediastinal lymph nodes are all subcentimeter short axis. For example AP window node measures 7 mm series 3, image 47. 10 mm right hilar node. The soft a Gus is decompressed, previous enteric tube is been removed. De-cannulated tracheostomy tube. Lungs/Pleura: Bandlike and consolidative right upper lobe airspace disease, some of which is low-density, obscuring the previous 11 mm right upper lobe nodule. There is progression in airspace disease from prior CT. There is improvement in the paramediastinal left upper lobe nodular airspace process. However there is increasing/new peripheral consolidation in the  perifissural as well as apical left upper lobe. Decreased size of right lower lobe nodule, currently 5 mm series 5, image 103, previously 7 mm by my retrospective measurement. Small perifissural nodular densities in the right upper lobe series 5, image 83 have improved. The more diffuse tree-in-bud opacities on prior CT have resolved. There is moderate background emphysema. No pleural effusion. Upper Abdomen: Advanced hepatic steatosis. Prominence of the left renal pelvis is partially included, but was likely present on prior exam. There may be mild left urothelial enhancement. Musculoskeletal: There are no acute or suspicious osseous abnormalities. IMPRESSION: 1. Bandlike and consolidative right upper lobe airspace disease has progressed from prior exam, some of which is low-density, obscuring the previous 11 mm right upper lobe nodule. Given the low-density lipoid pneumonia is considered. Continued CT follow-up is recommended, perhaps in 2-3 months. 2. Improvement in the paramediastinal left upper lobe nodular airspace process. However there is increasing/new peripheral consolidation in the perifissural as well as apical left upper lobe, similar in appearance in the density to the right upper lobe process. 3. Lower lobe nodular densities have improved suggesting resolving infectious or inflammatory process. 4. The more diffuse tree-in-bud opacities on prior CT have resolved. 5. Moderate emphysema and bronchial thickening. 6. Shotty mediastinal and right hilar lymph nodes are nonspecific but likely reactive. 7. Advanced hepatic steatosis. 8. Prominence of the left renal pelvis is partially included, but was likely present on prior exam. There may be mild left urothelial enhancement. Recommend correlation with urinalysis. Aortic Atherosclerosis (ICD10-I70.0) and Emphysema (ICD10-J43.9). Electronically Signed   By: Narda Rutherford M.D.   On: 12/10/2021 20:57   DG Abd 1 View  Result Date: 11/29/2021 CLINICAL  DATA:  [id number] EXAM: ABDOMEN - 1 VIEW COMPARISON:  November 16, 2021 FINDINGS: The bowel gas pattern  is unremarkable. There has been interval passage of the oral contrast. No radio-opaque calculi or other significant radiographic abnormality are seen. Visualized lung bases are clear. Disc cage at L4-L5. IMPRESSION: Bowel-gas pattern is unremarkable. Electronically Signed   By: Marjo Bicker M.D.   On: 11/29/2021 10:52   DG Abd 1 View  Result Date: 11/16/2021 CLINICAL DATA:  Abdominal distention EXAM: ABDOMEN - 1 VIEW COMPARISON:  11/15/2021 abdominal radiograph FINDINGS: Retained oral contrast throughout the transverse and left colon, similar. Several mildly to moderately dilated small bowel loops throughout the abdomen, similar. No evidence of pneumatosis or pneumoperitoneum. Clear lung bases. IMPRESSION: Persistent mildly to moderately dilated small bowel loops throughout the abdomen. Retained oral contrast throughout the transverse and left colon, similar. Findings may indicate generalized adynamic ileus, with partial distal small bowel obstruction not excluded. Electronically Signed   By: Delbert Phenix M.D.   On: 11/16/2021 08:26   DG Abd 1 View  Result Date: 11/15/2021 CLINICAL DATA:  Abdominal distension EXAM: ABDOMEN - 1 VIEW COMPARISON:  11/07/2021 FINDINGS: Considerable contrast material is noted throughout the colon slightly improved when compare with the prior exam. This suggest extremely delayed transit time and findings of constipation. Multiple dilated loops of small bowel are seen. Degenerative changes of lumbar spine are noted. IMPRESSION: Findings consistent with colonic constipation with significant persistent contrast within the colon from several days ago. Increasing small bowel dilatation consistent with worsening small bowel obstruction. No free air is noted. Electronically Signed   By: Alcide Clever M.D.   On: 11/15/2021 19:40    Labs:  Basic Metabolic Panel:    Latest Ref Rng &  Units 12/12/2021    5:31 AM 12/10/2021    7:54 AM 12/06/2021    5:10 AM  BMP  Glucose 70 - 99 mg/dL 98  841    BUN 8 - 23 mg/dL 7  7    Creatinine 6.60 - 1.00 mg/dL 6.30  1.60    Sodium 109 - 145 mmol/L 133  129    Potassium 3.5 - 5.1 mmol/L 3.7  3.5  3.6   Chloride 98 - 111 mmol/L 93  92    CO2 22 - 32 mmol/L 26  27    Calcium 8.9 - 10.3 mg/dL 8.6  8.4       CBC:    Latest Ref Rng & Units 12/10/2021    7:54 AM 12/03/2021    6:05 AM 11/26/2021    6:16 AM  CBC  WBC 4.0 - 10.5 K/uL 14.8  13.5  15.2   Hemoglobin 12.0 - 15.0 g/dL 9.3  9.9  32.3   Hematocrit 36.0 - 46.0 % 29.2  31.4  31.8   Platelets 150 - 400 K/uL 643  481  480      CBG: No results for input(s): "GLUCAP" in the last 168 hours.  Brief HPI:   [name] is a 66 y.o. female with history of COPD (oxygen dependent but had taken herself off oxygen), chronic low back pain, anxiety disorder, panic attacks, recent diagnosis of PNA who was admitted on 09/24/2021 with hypoxia and tachypnea due to acute hypoxic hypercarbic respiratory failure.  She continued to worsen past admission requiring intubation as well as Precedex and ketamine to help manage anxiety.  She was found to have cardiomyopathy with EF of 40% and cardiology recommended ischemic work-up on outpatient basis once significantly improved from current illness.  She required tracheostomy on 05/01 and was weaned to [employer] but continued to  have a high oxygen needs due to significant anxiety.Her trach was changed to CFS #6 on 05/15 and antibiotics added on 05/16 due to increasing respiratory distress with leukocytosis.  Tracheal aspirate showed Klebsiella HAP and she was treated with 10-day course of cefepime.    Her respiratory status was improving and she was weaned down to 6 L per nasal cannula.  She was tolerating PMSV during the day and PCCM indicated no plans for decannulation currently.  Hospital course was significant for issues with ileus with abdominal distention, N/V as  well as constipation requiring bowel regimen.  She has also had issues with leukocytosis and CT chest showed multiple nodules.  Multiple UA done for work-up and were negative.  ID was consulted for input and Dr. Drucilla Schmidt felt that patient was leukocytosis was likely due to chronic aspiration and PCCM recommended repeat CT chest in 6 to 8 weeks to monitor for resolution.  PT OT was working with patient was tolerating standing attempts and steady.  She continued to be limited by fatigue with weakness, delay in processing as well as critical illness myopathy.  CIR was recommended due to functional decline.   Hospital Course: [name] was admitted to rehab 11/14/2021 for inpatient therapies to consist of PT, ST and OT at least three hours five days a week. Past admission physiatrist, therapy team and rehab RN have worked together to provide customized collaborative inpatient rehab.  Her blood pressures were monitored on TID basis and Aldactone was added with improvement in BP control.  Bowel program has been augmented on and off during her stay to manage alternating issues with constipation and diarrhea.  Her diarrhea has resolved and she developed issues with constipation requiring addition of Reglan as well as sorbitol briefly.  She has been educated on importance of compliance with current bowel regimen as well as titration of laxatives as needed.  She has been weaned off methadone and oxycodone during her stay.  Tramadol was scheduled on 4 times daily basis and she continues to report ongoing issues with back pain.  She was advised to work on endurance as well as increase time out of bed.  She is to follow-up with Dr. Hardin Negus for pain management after discharge.  Her respiratory status improved with improvement in activity tolerance, she was started on capping trials and was decannulated without difficulty.  Follow-up CBC showed fluctuating white count and repeat CT of chest was done showing improvement in  pneumonia but some areas of worsening particularly in right upper lobe and left upper lobe with development of massive fibrosis.  As patient without fever but increasing reports of cough for about a week, Dr. Silas Flood recommended Augmentin x14 days with follow-up in office for evaluation and input on repeat imaging.    Her p.o. intake has slowly improved and she was advanced to regular textures and is tolerating this without any side effects.  Team has provided ego support to help manage anxiety. Her Seroquel and Klonopin has been weaned off.  She continues to require low-dose Xanax once a day on average.    Follow up BMET showed hyponatremia which has improved overall. She was noted to have stage II decub ulcer with MASD at admission.  This has been managed with use of Medihoney and daily dressing changes as well as air mattress overlay for pressure-relief measures.  Nutritional supplements have also been offered to help promote wound healing.  Wound bed shows shallow open injury with red-pink wound bed without slough  and family has been educated on daily dressing changes.  She is also educated on importance of nutrition to help promote wound healing.  She has made steady progress during her stay and supervision is recommended for safety at discharge.  She will continue to receive follow-up Home health PT, OT and RN by [facility] after discharge.    Rehab course: During patient's stay in rehab weekly team conferences were held to monitor patient's progress, set goals and discuss barriers to discharge. At admission, patient required supervision to total assist +2 for basic ADL tasks and total assist with mobility. She was able to tolerate PMSV during all waking hours with adequate breath support and vocal intensity.  She declined solids and was initially maintained on liquid diet with aspiration risk.  She did demonstrate delay in processing and mild deficits in recall. She  has had improvement in  balance, activity tolerance as well as ability to compensate for deficits.  Family education has been completed.  Discharge disposition: 01-Home or Self Care  Diet: Heart Healthy.   Special Instructions: Follow up with Dr. Hardin Negus or pain management for input on back pain.   Discharge Instructions     Ambulatory referral to Physical Medicine Rehab   Complete by: As directed       Allergies as of 12/12/2021   Not on File      Medication List     STOP taking these medications    arformoterol 15 MCG/2ML Nebu Commonly known as: BROVANA   clonazePAM 0.5 MG tablet Commonly known as: KLONOPIN   ipratropium 17 MCG/ACT inhaler Commonly known as: ATROVENT HFA   Magnesium 250 MG Tabs   omeprazole 20 MG capsule Commonly known as: PRILOSEC   oxyCODONE 5 MG immediate release tablet Commonly known as: Oxy IR/ROXICODONE   QUEtiapine 25 MG tablet Commonly known as: SEROQUEL   Spiriva HandiHaler 18 MCG inhalation capsule Generic drug: tiotropium   tretinoin 0.1 % cream Commonly known as: RETIN-A   triamcinolone acetonide 40 MG/ML Susp Commonly known as: TRIESENCE   Yupelri 175 MCG/3ML nebulizer solution Generic drug: revefenacin       TAKE these medications    acetaminophen 325 MG tablet Commonly known as: TYLENOL Take 1-2 tablets (325-650 mg total) by mouth every 4 (four) hours as needed for mild pain. What changed:  how much to take when to take this reasons to take this   albuterol 108 (90 Base) MCG/ACT inhaler Commonly known as: Ventolin HFA INHALE 2 PUFFS INTO THE LUNGS EVERY 6 HOURS AS NEEDED FOR WHEEZE/SHORTNESS OF BREATH   ALPRAZolam 0.5 MG tablet Commonly known as: XANAX Take 1 tablet (0.5 mg total) by mouth daily as needed for anxiety.   amoxicillin-clavulanate 400-57 MG/5ML suspension Commonly known as: AUGMENTIN Take 10.9 mLs (875 mg total) by mouth every 12 (twelve) hours for 26 doses.   Breztri Aerosphere 160-9-4.8 MCG/ACT Aero Generic  drug: Budeson-Glycopyrrol-Formoterol Inhale 2 puffs into the lungs in the morning and at bedtime. What changed: Another medication with the same name was removed. Continue taking this medication, and follow the directions you see here.   fluticasone 50 MCG/ACT nasal spray Commonly known as: FLONASE Place 1 spray into both nostrils daily as needed for allergies.   leptospermum manuka honey Pste paste Apply 1 Application topically daily. Notes to patient: Apply to area of breakdown on buttock and cover with dry dressing. Change daily and more frequently if soiled.    loratadine 10 MG tablet Commonly known as: CLARITIN  Take 10 mg by mouth daily as needed for allergies.   metoprolol tartrate 25 MG tablet Commonly known as: LOPRESSOR Take 0.5 tablets (12.5 mg total) by mouth 2 (two) times daily. Notes to patient: To strengthen the heart.   montelukast 10 MG tablet Commonly known as: SINGULAIR Take 1 tablet (10 mg total) by mouth in the morning.   multivitamin with minerals Tabs tablet Take 1 tablet by mouth daily.   ondansetron 4 MG tablet Commonly known as: Zofran Take 1 tablet (4 mg total) by mouth 2 (two) times daily as needed for nausea or vomiting.   pantoprazole 40 MG tablet Commonly known as: PROTONIX Take 1 tablet (40 mg total) by mouth daily.   polyethylene glycol 17 g packet Commonly known as: MIRALAX / GLYCOLAX Take 17 g by mouth 2 (two) times daily. Purchase over the counter--need to start it 1-2 times a day to prevent constipation What changed: additional instructions   potassium chloride 10 MEQ tablet Commonly known as: KLOR-CON M Take 1 tablet (10 mEq total) by mouth 2 (two) times daily.   rosuvastatin 20 MG tablet Commonly known as: CRESTOR Take 1 tablet (20 mg total) by mouth daily. What changed:  medication strength how much to take   senna-docusate 8.6-50 MG tablet Commonly known as: Senokot-S Take 2 tablets by mouth daily at 6 (six) AM. Purchase  over the counter What changed:  how much to take when to take this additional instructions   simethicone 80 MG chewable tablet Commonly known as: MYLICON Chew 1 tablet (80 mg total) by mouth 4 (four) times daily as needed for flatulence.   spironolactone 25 MG tablet Commonly known as: ALDACTONE Take 1 tablet (25 mg total) by mouth daily.   traMADol 50 MG tablet Commonly known as: ULTRAM Take 1 tablet (50 mg total) by mouth 4 (four) times daily -  before meals and at bedtime.   traZODone 50 MG tablet Commonly known as: DESYREL Take 0.5-1 tablets (25-50 mg total) by mouth at bedtime as needed for sleep.   white petrolatum Oint Commonly known as: VASELINE Apply 1 application  topically as needed for lip care.        Follow-up Information     Kirsteins, Luanna Salk, MD Follow up.   Specialty: Physical Medicine and Rehabilitation Contact information: [place] [address] [phone]         Fay Records, MD Follow up.   Specialty: Cardiology Why: Call in 1-2 days for post hospital follow up--weak heart Contact information: [place] [address] [phone]         Celene Squibb, MD Follow up.   Specialty: Internal Medicine Why: Office will mail a new patient screen form prior to decision on acceptance. Contact information: [place] [facility] [address] [phone]         Collene Gobble, MD Follow up on 12/26/2021.   Specialty: Pulmonary Disease Why: be there at 1:40 for 2 pm appointment Contact information: [place] [address] [phone]         Nicholaus Bloom, MD. Schedule an appointment as soon as possible for a visit.   Specialty: Anesthesiology Why: for follow up on pain Contact information: [place], [address] [place] [address] [phone]                 Signed: Bary Leriche 12/13/2021, 9:14 PM

## 2021-12-12 NOTE — Plan of Care (Signed)
  Problem: RH Balance Goal: LTG Patient will maintain dynamic sitting balance (PT) Description: LTG:  Patient will maintain dynamic sitting balance with assistance during mobility activities (PT) Outcome: Completed/Met Flowsheets (Taken 11/15/2021 1223 by Excell Seltzer, PT) LTG: Pt will maintain dynamic sitting balance during mobility activities with:: Independent with assistive device    Problem: Sit to Stand Goal: LTG:  Patient will perform sit to stand with assistance level (PT) Description: LTG:  Patient will perform sit to stand with assistance level (PT) Outcome: Completed/Met Flowsheets (Taken 12/11/2021 1502) LTG: PT will perform sit to stand in preparation for functional mobility with assistance level:  Contact Guard/Touching assist  Supervision/Verbal cueing   Problem: RH Bed Mobility Goal: LTG Patient will perform bed mobility with assist (PT) Description: LTG: Patient will perform bed mobility with assistance, with/without cues (PT). Outcome: Completed/Met Flowsheets (Taken 12/11/2021 1502) LTG: Pt will perform bed mobility with assistance level of: Minimal Assistance - Patient > 75% Note: Pt is Mod I for rolling and supine to sit. Requires light MinA/ CGA for bringing BLE to bed surface to perform sit to supine. Continues to improve with increases in strength.    Problem: RH Bed to Chair Transfers Goal: LTG Patient will perform bed/chair transfers w/assist (PT) Description: LTG: Patient will perform bed to chair transfers with assistance (PT). Outcome: Completed/Met Flowsheets (Taken 12/11/2021 1502) LTG: Pt will perform Bed to Chair Transfers with assistance level: Contact Guard/Touching assist   Problem: RH Car Transfers Goal: LTG Patient will perform car transfers with assist (PT) Description: LTG: Patient will perform car transfers with assistance (PT). Outcome: Completed/Met Flowsheets (Taken 12/11/2021 1502) LTG: Pt will perform car transfers with assist:: Contact  Guard/Touching assist   Problem: RH Ambulation Goal: LTG Patient will ambulate in controlled environment (PT) Description: LTG: Patient will ambulate in a controlled environment, # of feet with assistance (PT). Outcome: Completed/Met Flowsheets (Taken 12/11/2021 1502) LTG: Pt will ambulate in controlled environ  assist needed:: Supervision/Verbal cueing LTG: Ambulation distance in controlled environment: 123 ft with RW and Bil AFOs Goal: LTG Patient will ambulate in home environment (PT) Description: LTG: Patient will ambulate in home environment, # of feet with assistance (PT). 12/12/2021 0509 by Alger Simons, PT Outcome: Completed/Met 12/12/2021 0502 by Alger Simons, PT Flowsheets (Taken 12/11/2021 1502) LTG: Pt will ambulate in home environ  assist needed:: Contact Guard/Touching assist LTG: Ambulation distance in home environment: 50 ft with RW and Bil AFOs   Problem: RH Wheelchair Mobility Goal: LTG Patient will propel w/c in controlled environment (PT) Description: LTG: Patient will propel wheelchair in controlled environment, # of feet with assist (PT) Outcome: Completed/Met Flowsheets Taken 12/11/2021 1502 by Judieth Keens A, PT LTG: Propel w/c distance in controlled environment: >200 ft Taken 11/15/2021 1223 by Excell Seltzer, PT LTG: Pt will propel w/c in controlled environ  assist needed:: Independent with assistive device Goal: LTG Patient will propel w/c in home environment (PT) Description: LTG: Patient will propel wheelchair in home environment, # of feet with assistance (PT). Outcome: Completed/Met Flowsheets Taken 12/11/2021 1502 by Alger Simons, PT Distance: wheelchair distance in controlled environment: 200 Taken 11/15/2021 1223 by Excell Seltzer, PT LTG: Pt will propel w/c in home environ  assist needed:: Independent with assistive device LTG: Propel w/c distance in home environment: 75 ft Note: Requires supervision/ CGA for parts mgmt.

## 2021-12-12 NOTE — Telephone Encounter (Signed)
Pt scheduled with RB on 7/19.

## 2021-12-12 NOTE — Progress Notes (Signed)
INPATIENT REHABILITATION DISCHARGE NOTE   Discharge instructions by: Marissa Nestle, PA  Verbalized understanding: yes   Skin care/Wound care healing? Yes. Family educated on dressing changes.   Pain: medicated  IV's: removed   Tubes/Drains: none  O2: none  Safety instructions: reviewed with pt and family  Patient belongings: sent with pt  Discharged to: home  Discharged via: family car  Notes: done   Marylu Lund, Charity fundraiser

## 2021-12-12 NOTE — Progress Notes (Signed)
Inpatient Rehabilitation Discharge Medication Review by a Pharmacist  A complete drug regimen review was completed for this patient to identify any potential clinically significant medication issues.  High Risk Drug Classes Is patient taking? Indication by Medication  Antipsychotic Yes   Anticoagulant Yes   Antibiotic No Augmentin: aspiration PNA tx  Opioid Yes Tramadol: PRN pain  Antiplatelet No   Hypoglycemics/insulin No   Vasoactive Medication Yes Metoprolol: hypertension  Chemotherapy No   Other Yes albuterol: PRN SOB Breztri, montelukast: SOB/COPD alprazolam: anxiety/depression Flonase, loratadine: PRN allergies KCl, MVI: supplement Ondansetron: PRN nausea/vomiting Pantoprazole: GERD ppx Miralax, Senokot-S, Fleet, bisacodyl: constipation Rosuvastatin: HLD Simethicone: PRN flatulence Spironolactone: CHF, HTN Trazodone: PRN sleep     Type of Medication Issue Identified Description of Issue Recommendation(s)  Drug Interaction(s) (clinically significant)     Duplicate Therapy     Allergy     No Medication Administration End Date     Incorrect Dose     Additional Drug Therapy Needed     Significant med changes from prior encounter (inform family/care partners about these prior to discharge). Discontinued medications: - buspirone, prednisone, revenacin, omeprazole (replaced with pantoprazole), oxycodone (replaced with tramadol) Discuss medication changes with patient/family at discharge  Other       Clinically significant medication issues were identified that warrant physician communication and completion of prescribed/recommended actions by midnight of the next day:  No   Pharmacist comments: n/a   Time spent performing this drug regimen review (minutes): 20   Thank you for allowing pharmacy to be a part of this patient's care.  Thelma Barge, PharmD Clinical Pharmacist

## 2021-12-12 NOTE — Progress Notes (Addendum)
Physical Therapy Discharge Summary  Patient Details  Name: [name] MRN: [id number] Date of Birth: May 20, 1956  Today's Date: 12/11/2021 PT Individual Time:  1107-1207  PT Individual Time Calculation (min): 60 min    Patient has met 9 of 9 long term goals due to improved activity tolerance, improved balance, improved postural control, increased strength, decreased pain, functional use of  right lower extremity and left lower extremity, improved awareness, and improved coordination.  Patient to discharge at an ambulatory level  CGA .   Patient's care partner is independent to provide the necessary physical assistance at discharge.  Reasons goals not met: n/a  Recommendation:  Patient will benefit from ongoing skilled PT services in  home health setting follow quickly by outpatient setting  to continue to advance safe functional mobility, address ongoing impairments in strength, coordination, balance, activity tolerance, cognition, safety awareness, and to minimize fall risk.  Equipment: W/c, RW, Bil AFOs, dynamic night splints for foot drop  Reasons for discharge: treatment goals met and discharge from hospital  Patient/family agrees with progress made and goals achieved: Yes  PT Discharge Precautions/Restrictions Precautions Precautions: Fall Precaution Comments: B foot drop improving from eval, Bil AFOs Restrictions Weight Bearing Restrictions: No Other Position/Activity Restrictions: Dynamic night splints to be worn at night to improve PF contractures Vital Signs Therapy Vitals Temp: 97.7 F (36.5 C) Pulse Rate: 96 Resp: 14 BP: 104/60 Patient Position (if appropriate): Lying Oxygen Therapy SpO2: 92 % O2 Device: Room Air Pain Pain Assessment Pain Scale: 0-10 Pain Score: 0-No pain Pain Interference Pain Interference Pain Effect on Sleep: 1. Rarely or not at all;2. Occasionally Pain Interference with Therapy Activities: 1. Rarely or not at all Pain Interference  with Day-to-Day Activities: 2. Occasionally;1. Rarely or not at all Vision/Perception  Vision - History Ability to See in Adequate Light: 0 Adequate Perception Perception: Within Functional Limits Praxis Praxis: Intact  Cognition Overall Cognitive Status: Within Functional Limits for tasks assessed Arousal/Alertness: Awake/alert Orientation Level: Oriented X4 Focused Attention: Appears intact Sustained Attention: Appears intact Selective Attention: Appears intact Alternating Attention: Appears intact Memory: Appears intact Awareness: Appears intact Problem Solving: Appears intact Safety/Judgment: Appears intact Sensation Sensation Light Touch: Appears Intact Coordination Gross Motor Movements are Fluid and Coordinated: No Fine Motor Movements are Fluid and Coordinated: No Coordination and Movement Description: decreased strength/ mobility of BLE improving from eval with strength increases Heel Shin Test: initiates but strength limits ability to reach heel to shin bilaterally Motor  Motor Motor: Within Functional Limits Motor - Discharge Observations: improving trunk stability with minimal malalignment  Mobility Bed Mobility Bed Mobility: Rolling Right;Rolling Left;Sit to Supine;Supine to Sit;Sitting - Scoot to [employer] of Bed Rolling Right: Independent with assistive device Rolling Left: Independent with assistive device Supine to Sit: Independent with assistive device Sitting - Scoot to Edge of Bed: Independent with assistive device Sit to Supine: Contact Guard/Touching assist;Minimal Assistance - Patient > 75% Scooting to [facility]: Supervision/Verbal Cueing Transfers Transfers: Sit to Stand;Stand to Sit;Squat Pivot Transfers Sit to Stand: Supervision/Verbal cueing Stand to Sit: Supervision/Verbal cueing Stand Pivot Transfers: Supervision/Verbal cueing;Contact Guard/Touching assist Squat Pivot Transfers: Minimal Assistance - Patient > 75% Transfer (Assistive device): Rolling  walker Locomotion  Gait Ambulation: Yes Gait Assistance: Contact Guard/Touching assist Gait Distance (Feet): 123 Feet Assistive device: Rolling walker Gait Gait: Yes Gait Pattern: Decreased step length - left;Decreased step length - right;Step-through pattern;Decreased hip/knee flexion - right;Decreased hip/knee flexion - left;Decreased dorsiflexion - right;Decreased dorsiflexion - left Gait velocity: decreased but improving since  eval Stairs / Additional Locomotion Stairs: No Ramp: Minimal Assistance - Patient >75% (light MinA/ CGA with w/c) Curb: Contact Guard/Touching assist (with RW) Wheelchair Mobility Wheelchair Mobility: Yes Wheelchair Assistance: Chartered loss adjuster: Both upper extremities Wheelchair Parts Management: Needs assistance;Supervision/cueing Distance: >200 ft  Trunk/Postural Assessment  Cervical Assessment Cervical Assessment: Exceptions to [facility] (forward head) Thoracic Assessment Thoracic Assessment: Exceptions to [facility] (rounded shoulders) Lumbar Assessment Lumbar Assessment: Exceptions to [facility] (posterior pelvic tilt in sitting) Postural Control Postural Control: Within Functional Limits (trunk stability much improved since eval) Righting Reactions: delayed/insufficient  Balance Balance Balance Assessed: Yes Static Sitting Balance Static Sitting - Balance Support: Feet supported Static Sitting - Level of Assistance: 6: Modified independent (Device/Increase time) Dynamic Sitting Balance Dynamic Sitting - Balance Support: No upper extremity supported;Feet supported;During functional activity Dynamic Sitting - Level of Assistance: 5: Stand by assistance Static Standing Balance Static Standing - Balance Support: Bilateral upper extremity supported;During functional activity Static Standing - Level of Assistance: 5: Stand by assistance Dynamic Standing Balance Dynamic Standing - Balance Support: Bilateral upper extremity  supported;During functional activity Dynamic Standing - Level of Assistance: 4: Min assist;5: Stand by assistance (CGA/ MinA) Dynamic Standing - Balance Activities: Reaching for objects Extremity Assessment      RLE Assessment RLE Assessment: Exceptions to [facility] RLE Strength Right Hip Flexion: 3-/5 Right Knee Flexion: 3-/5 Right Knee Extension: 4/5 Right Ankle Dorsiflexion: 3-/5 LLE Strength Left Hip Flexion: 3-/5 Left Knee Flexion: 3-/5 Left Knee Extension: 4/5 Left Ankle Dorsiflexion: 3-/5  Skilled Interventions: Patient seated upright in w/c on entrance to room. Patient alert and agreeable to PT session.   Patient with no pain complaint throughout session.  Therapeutic Activity: Bed Mobility: Patient performed supine --> sit with Mod I. At end of session, pt is able to bring Bil feet 2 inches from bed surface with CGA for tactile cueing. Min A for final 2 inches to bed surface. VC/ tc required for technique. Transfers: Patient performed sit<>stand and stand pivot transfers throughout session with close supervision. Provided min verbal cues for technique. Toilet transfer and car transfer both performed with close supervision. Requires MinA with clothing mgmt while standing. For car transfer, pt is able to use BUE to assist with lift of BLE into footwell of car.   Gait Training:  Patient ambulated several short distances in therapy gym as well as 123 ft with Bil AFOs donned using RW with close supervision and w/c follow in case of fatigue. Demonstrated continuing improvement with decreased catch of toes. Demos slightly more instances on R than L but significant improvement overall.   Pt also performs curb step with overall CGA and light minA to reach LLE to top of step. Pt initiating well. Follows instructions for safe positioning and walker placement throughout.   Wheelchair Mobility:  Patient propelled wheelchair >200  feet with distant supervision. Provided vc for minimal parts  management and ensuring lock of brakes. .  Patient supine  in bed at end of session with brakes locked, no alarm set, and all needs within reach.    Alger Simons PT, DPT, CSRS 12/11/2021, 5:11 PM

## 2021-12-12 NOTE — Progress Notes (Signed)
PROGRESS NOTE   Subjective/Complaints:  Appreciate pulmonary note  Discussed benzo use at home , took prn xanax usually at night , did not take clonazepam at home  DIscussed avoidance of Oxycodone , rec cont tramadol ROS: + Constipation , no abd pain no N/V  Objective:   CT CHEST W CONTRAST  Result Date: 12/10/2021 CLINICAL DATA:  Abnormal xray - lung nodule, < 1 cm, mod-high risk EXAM: CT CHEST WITH CONTRAST TECHNIQUE: Multidetector CT imaging of the chest was performed during intravenous contrast administration. RADIATION DOSE REDUCTION: This exam was performed according to the departmental dose-optimization program which includes automated exposure control, adjustment of the mA and/or kV according to patient size and/or use of iterative reconstruction technique. CONTRAST:  66mL OMNIPAQUE IOHEXOL 300 MG/ML  SOLN COMPARISON:  Chest radiograph and CT 10/28/2021 FINDINGS: Cardiovascular: Atherosclerosis of the thoracic aorta. No aortic aneurysm. No central pulmonary embolus. The heart is normal in size. No pericardial effusion. Mediastinum/Nodes: Scattered small mediastinal lymph nodes are all subcentimeter short axis. For example AP window node measures 7 mm series 3, image 47. 10 mm right hilar node. The soft a Gus is decompressed, previous enteric tube is been removed. De-cannulated tracheostomy tube. Lungs/Pleura: Bandlike and consolidative right upper lobe airspace disease, some of which is low-density, obscuring the previous 11 mm right upper lobe nodule. There is progression in airspace disease from prior CT. There is improvement in the paramediastinal left upper lobe nodular airspace process. However there is increasing/new peripheral consolidation in the perifissural as well as apical left upper lobe. Decreased size of right lower lobe nodule, currently 5 mm series 5, image 103, previously 7 mm by my retrospective measurement. Small  perifissural nodular densities in the right upper lobe series 5, image 83 have improved. The more diffuse tree-in-bud opacities on prior CT have resolved. There is moderate background emphysema. No pleural effusion. Upper Abdomen: Advanced hepatic steatosis. Prominence of the left renal pelvis is partially included, but was likely present on prior exam. There may be mild left urothelial enhancement. Musculoskeletal: There are no acute or suspicious osseous abnormalities. IMPRESSION: 1. Bandlike and consolidative right upper lobe airspace disease has progressed from prior exam, some of which is low-density, obscuring the previous 11 mm right upper lobe nodule. Given the low-density lipoid pneumonia is considered. Continued CT follow-up is recommended, perhaps in 2-3 months. 2. Improvement in the paramediastinal left upper lobe nodular airspace process. However there is increasing/new peripheral consolidation in the perifissural as well as apical left upper lobe, similar in appearance in the density to the right upper lobe process. 3. Lower lobe nodular densities have improved suggesting resolving infectious or inflammatory process. 4. The more diffuse tree-in-bud opacities on prior CT have resolved. 5. Moderate emphysema and bronchial thickening. 6. Shotty mediastinal and right hilar lymph nodes are nonspecific but likely reactive. 7. Advanced hepatic steatosis. 8. Prominence of the left renal pelvis is partially included, but was likely present on prior exam. There may be mild left urothelial enhancement. Recommend correlation with urinalysis. Aortic Atherosclerosis (ICD10-I70.0) and Emphysema (ICD10-J43.9). Electronically Signed   By: Keith Rake M.D.   On: 12/10/2021 20:57   Recent Labs    12/10/21  0754  WBC 14.8*  HGB 9.3*  HCT 29.2*  PLT 643*     Recent Labs    12/10/21 0754 12/12/21 0531  NA 129* 133*  K 3.5 3.7  CL 92* 93*  CO2 27 26  GLUCOSE 126* 98  BUN 7* 7*  CREATININE 0.41* 0.35*   CALCIUM 8.4* 8.6*      Intake/Output Summary (Last 24 hours) at 12/12/2021 0854 Last data filed at 12/12/2021 0741 Gross per 24 hour  Intake 351 ml  Output --  Net 351 ml      Pressure Injury 11/03/21 Coccyx Medial Stage 2 -  Partial thickness loss of dermis presenting as a shallow open injury with a red, pink wound bed without slough. (Active)  11/03/21 1556  Location: Coccyx  Location Orientation: Medial  Staging: Stage 2 -  Partial thickness loss of dermis presenting as a shallow open injury with a red, pink wound bed without slough.  Wound Description (Comments):   Present on Admission:     Physical Exam: Vital Signs Blood pressure 125/76, pulse (!) 110, temperature 97.7 F (36.5 C), resp. rate 14, height 5\' 1"  (1.549 m), weight 45.7 kg, SpO2 94 %.   General: No acute distress Mood and affect are appropriate Heart: Regular rate and rhythm no rubs murmurs or extra sounds Lungs: Clear to auscultation, breathing unlabored, no rales or wheezes Abdomen: Positive bowel sounds, soft nontender to palpation, nondistended Extremities: No clubbing, cyanosis, or edema   Psych: Pt's affect is appropriate. Pt is cooperative Skin: Warm and dry, stage II coccyx 1 cm with fibronecrotic debris in center.  Also has reddened area about 2 cm x 2 cm on left buttock with skin intact, no pressure ulcer. Neuro: CN II-XII intact, motor strength 4/5 bilateral deltoid, bicep, triceps, grip, trace hip flexor, knee extensors, 3-5 RIght and 2- left ankle dorsiflexor and 3+ plantar flexor. Sensory: Mildly reduced sensation to LT in feet, propioception intact-no change. Musculoskeletal: FROM in UE, limited AROM in LE due to weakness. No joint swelling.   Assessment/Plan: 1. 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  Stable for D/C today F/u PCP in 3-4 weeks F/u PM&R 2 weeks See D/C summary See D/C instructions   Care Tool:  Bathing    Body parts bathed by patient: Right arm,  Left arm, Front perineal area, Abdomen, Chest, Buttocks, Right upper leg, Left upper leg, Right lower leg, Left lower leg, Face   Body parts bathed by helper: Buttocks     Bathing assist Assist Level: Supervision/Verbal cueing     Upper Body Dressing/Undressing Upper body dressing   What is the patient wearing?: Pull over shirt    Upper body assist Assist Level: Set up assist    Lower Body Dressing/Undressing Lower body dressing      What is the patient wearing?: Pants     Lower body assist Assist for lower body dressing: Minimal Assistance - Patient > 75%     Toileting Toileting    Toileting assist Assist for toileting: Moderate Assistance - Patient 50 - 74%     Transfers Chair/bed transfer  Transfers assist  Chair/bed transfer activity did not occur: N/A  Chair/bed transfer assist level: Contact Guard/Touching assist     Locomotion Ambulation   Ambulation assist   Ambulation activity did not occur: Safety/medical concerns  Assist level: Contact Guard/Touching assist Assistive device: Walker-rolling Max distance: 123 ft   Walk 10 feet activity   Assist  Walk 10 feet activity did not occur:  Safety/medical concerns  Assist level: Supervision/Verbal cueing Assistive device: Walker-rolling   Walk 50 feet activity   Assist Walk 50 feet with 2 turns activity did not occur: Safety/medical concerns  Assist level: Contact Guard/Touching assist Assistive device: Walker-rolling    Walk 150 feet activity   Assist Walk 150 feet activity did not occur: Safety/medical concerns         Walk 10 feet on uneven surface  activity   Assist Walk 10 feet on uneven surfaces activity did not occur: Safety/medical concerns         Wheelchair     Assist Is the patient using a wheelchair?: Yes Type of Wheelchair: Manual Wheelchair activity did not occur: Safety/medical concerns  Wheelchair assist level: Set up assist Max wheelchair distance: 200  ft    Wheelchair 50 feet with 2 turns activity    Assist    Wheelchair 50 feet with 2 turns activity did not occur: Safety/medical concerns   Assist Level: Independent   Wheelchair 150 feet activity     Assist  Wheelchair 150 feet activity did not occur: Safety/medical concerns   Assist Level: Supervision/Verbal cueing   Blood pressure 125/76, pulse (!) 110, temperature 97.7 F (36.5 C), resp. rate 14, height 5\' 1"  (1.549 m), weight 45.7 kg, SpO2 94 %.  Medical Problem List and Plan: 1. 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  Antithrombotics: -DVT/anticoagulation:  Pharmaceutical: Lovenox             -antiplatelet therapy: N/a 3. Pain:  Managed by Dr.Phillips. Off Methadone and Oxy IR now on tramadol- 7/1 Doing okay off methadone, Oxy IR and currently on tramadol 50mg  QID, will d/c on this med and have pt f/u with her pain management  MD 7/2 No pain issues today. 4. Mood: Team to provide ego support/encouragement. LCSW to follow for evaluation and support.              -antipsychotic agents:  N/A 5. Neuropsych: This patient is capable of making decisions on her own behalf. 6. Stage 2 decubitus ulcer: Routine pressure relief measures.              --continue air mattress overlay for MASD/Stage 2 decub.   6/11 begin medihoney to wound daily 7/1 Medihoney doing great on rt buttock wound.  Redness 2 cm x 2 cm on left buttuck but no pressure ulcer yet, encourage q2 hr turns. 7/2 Saw dressing change with nurse, healing well.  Skin intact without redness of left buttock. 7. Fluids/Electrolytes/Nutrition: Strict I/O. Continue regular diet            Hypo K improved after supplementation, will d/c KCL recheck on Friday ,     Latest Ref Rng & Units 12/12/2021    5:31 AM 12/10/2021    7:54 AM 12/06/2021    5:10 AM  BMP   Glucose 70 - 99 mg/dL 98  02/10/2022    BUN 8 - 23 mg/dL 7  7    Creatinine 12/08/2021 - 1.00 mg/dL 595  6.38    Sodium 7.56 - 145 mmol/L 133  129    Potassium 3.5 - 5.1 mmol/L 3.7  3.5  3.6   Chloride 98 - 111 mmol/L 93  92    CO2 22 -  32 mmol/L 26  27    Calcium 8.9 - 10.3 mg/dL 8.6  8.4    Needs low dose KCL daily supplement  7/2 Continue low dose Kcl 10 mEq BID, trend with weekly labs 8. COPD  --Continue Singulair, Yuperi, Brovana and Pulmicort nebs.  -6/16 Decannulated 7/2 Taking Mucinex along with nebs.  Some yellow sputum production last night and this morning, lung sounds clear, no distress, afebrile, and WBC's trending down over past few weeks. Discussed with Dr. Dagoberto Ligas, MD, no CXR recommended at this time, but continue to monitor. Pt with chronic allergic rhinitis resume flonase  9. Leucocytosis:     Latest Ref Rng & Units 12/10/2021    7:54 AM 12/03/2021    6:05 AM 11/26/2021    6:16 AM  CBC  WBC 4.0 - 10.5 K/uL 14.8  13.5  15.2   Hemoglobin 12.0 - 15.0 g/dL 9.3  9.9  10.4   Hematocrit 36.0 - 46.0 % 29.2  31.4  31.8   Platelets 150 - 400 K/uL 643  481  480   Slightly up but afeb 10. Panic attacks/Anxiety d/o: Klonopin weaned down 05/30 to  0.5 mg TID.6/16 down to .25mg  TID , D/C ativan, resume prn xanax             --Seroquel Reduced to 12.5 mg BID  -  11. Cardiomyopathy: Continue Metoprolol 12.5 mg bid and Crestor.              --aldactone added 06/07-->monitor for orthostatic symptoms.  No sign of failure 6/25 12. Ileus/intermittent issues w/N/V: Has been refusing miralax and senna intermittently. Will decrease to Miralax to daily w/Senna S daily             --Keep Mg>2.0 and K>4.0 for adequate supplementation/recurrence.          K+ improved still requiring supplements, recheck in am              --add Mg for supplement.  13. Pulmonary nodules: PCCM recommends repeat CT 6-8 weeks from 10/28/21 to monitor for resolution. Nodules resolved some increased Upper lobe opacity mainly on  right, prob post infectious will as  PCCM to review and rec f/u , radiology rec f/u CT in 2-3 mo 14. Interstitial cystitis s/p cystectomy and fistula repair: Followed by Dr.Evans.   37.  Dispo discussed need for assistance post d/c , husband reportedly undergoing chemotherapy treatment recently told he has Stage 4 , discuss in conf Wed , pt is anxious about care needs , has other family members who can assist  40. UTI  -U/A ordered, >50 wbc, bacterial, nitrates  -Keflex 250mg  q6h ordered for 5 days, await culture, +proteus 100K, s to Keflex  7/1 Keflex course completed 17. Hyponatremia, appears to be chronic, may be related to spironolactone       Latest Ref Rng & Units 12/12/2021    5:31 AM 12/10/2021    7:54 AM 12/06/2021    5:10 AM  BMP  Glucose 70 - 99 mg/dL 98  126    BUN 8 - 23 mg/dL 7  7    Creatinine 0.44 - 1.00 mg/dL 0.35  0.41    Sodium 135 - 145 mmol/L 133  129    Potassium 3.5 - 5.1 mmol/L 3.7  3.5  3.6   Chloride 98 - 111 mmol/L 93  92    CO2 22 - 32 mmol/L 26  27    Calcium 8.9 - 10.3 mg/dL 8.6  8.4  Range upper 120s to low 130s asymptomatic recheck in am  18.  Bowel regulation had ileus which resolved, chronic constipation at home , took dulc tabs daily Increase  senna S 3 BID  LOS: 28 days A FACE TO FACE EVALUATION WAS PERFORMED  Charlett Blake 12/12/2021, 8:54 AM

## 2021-12-13 ENCOUNTER — Encounter (HOSPITAL_COMMUNITY): Payer: Self-pay | Admitting: Physical Medicine & Rehabilitation

## 2021-12-13 ENCOUNTER — Encounter (HOSPITAL_COMMUNITY): Payer: Medicare Other

## 2021-12-13 DIAGNOSIS — E871 Hypo-osmolality and hyponatremia: Secondary | ICD-10-CM

## 2021-12-13 DIAGNOSIS — D75839 Thrombocytosis, unspecified: Secondary | ICD-10-CM

## 2021-12-13 DIAGNOSIS — D72829 Elevated white blood cell count, unspecified: Secondary | ICD-10-CM

## 2021-12-13 NOTE — Progress Notes (Signed)
Recreational Therapy Discharge Summary Patient Details  Name: [name] MRN: [id number] Date of Birth: 1956/06/09 Today's Date: 12/13/2021  Comments on progress toward goals: Pt made good progress during LOS and is discharging home with family providing supervision/assistance.  TR sessions focused on pt education with emphasis on leisure education,  social, emotional, spiritual aspects of wellness in addition to physical,  activity analysis/modifications and relaxation strategies.  Reasons for discharge: discharge from hospital  Follow-up: Home Health  Patient/family agrees with progress made and goals achieved: Yes  Tynesha Free 12/13/2021, 9:13 AM

## 2021-12-16 DIAGNOSIS — M21371 Foot drop, right foot: Secondary | ICD-10-CM | POA: Diagnosis not present

## 2021-12-16 DIAGNOSIS — J440 Chronic obstructive pulmonary disease with acute lower respiratory infection: Secondary | ICD-10-CM | POA: Diagnosis not present

## 2021-12-16 DIAGNOSIS — J9602 Acute respiratory failure with hypercapnia: Secondary | ICD-10-CM | POA: Diagnosis not present

## 2021-12-16 DIAGNOSIS — G8929 Other chronic pain: Secondary | ICD-10-CM | POA: Diagnosis not present

## 2021-12-16 DIAGNOSIS — M21372 Foot drop, left foot: Secondary | ICD-10-CM | POA: Diagnosis not present

## 2021-12-16 DIAGNOSIS — F410 Panic disorder [episodic paroxysmal anxiety] without agoraphobia: Secondary | ICD-10-CM | POA: Diagnosis not present

## 2021-12-16 DIAGNOSIS — J189 Pneumonia, unspecified organism: Secondary | ICD-10-CM | POA: Diagnosis not present

## 2021-12-16 DIAGNOSIS — I429 Cardiomyopathy, unspecified: Secondary | ICD-10-CM | POA: Diagnosis not present

## 2021-12-16 DIAGNOSIS — I5023 Acute on chronic systolic (congestive) heart failure: Secondary | ICD-10-CM | POA: Diagnosis not present

## 2021-12-16 DIAGNOSIS — R918 Other nonspecific abnormal finding of lung field: Secondary | ICD-10-CM | POA: Diagnosis not present

## 2021-12-16 DIAGNOSIS — D696 Thrombocytopenia, unspecified: Secondary | ICD-10-CM | POA: Diagnosis not present

## 2021-12-16 DIAGNOSIS — M545 Low back pain, unspecified: Secondary | ICD-10-CM | POA: Diagnosis not present

## 2021-12-16 DIAGNOSIS — J9601 Acute respiratory failure with hypoxia: Secondary | ICD-10-CM | POA: Diagnosis not present

## 2021-12-16 DIAGNOSIS — I110 Hypertensive heart disease with heart failure: Secondary | ICD-10-CM | POA: Diagnosis not present

## 2021-12-16 DIAGNOSIS — L89322 Pressure ulcer of left buttock, stage 2: Secondary | ICD-10-CM | POA: Diagnosis not present

## 2021-12-16 DIAGNOSIS — G6281 Critical illness polyneuropathy: Secondary | ICD-10-CM | POA: Diagnosis not present

## 2021-12-17 ENCOUNTER — Telehealth: Payer: Self-pay | Admitting: *Deleted

## 2021-12-17 ENCOUNTER — Ambulatory Visit: Payer: Medicare Other | Admitting: Physician Assistant

## 2021-12-17 NOTE — Telephone Encounter (Signed)
Sam called for verbal approval of POC for SN for dz management and med management 2wk2, 1wk2, 3 mo1, and 2 prn visits. Approval given.

## 2021-12-18 ENCOUNTER — Encounter (HOSPITAL_COMMUNITY): Payer: Medicare Other

## 2021-12-19 DIAGNOSIS — R5383 Other fatigue: Secondary | ICD-10-CM | POA: Diagnosis not present

## 2021-12-19 DIAGNOSIS — J189 Pneumonia, unspecified organism: Secondary | ICD-10-CM | POA: Diagnosis not present

## 2021-12-19 DIAGNOSIS — F419 Anxiety disorder, unspecified: Secondary | ICD-10-CM | POA: Diagnosis not present

## 2021-12-19 DIAGNOSIS — F418 Other specified anxiety disorders: Secondary | ICD-10-CM | POA: Diagnosis not present

## 2021-12-19 DIAGNOSIS — R112 Nausea with vomiting, unspecified: Secondary | ICD-10-CM | POA: Diagnosis not present

## 2021-12-20 ENCOUNTER — Encounter (HOSPITAL_COMMUNITY): Payer: Medicare Other

## 2021-12-24 ENCOUNTER — Other Ambulatory Visit: Payer: Self-pay | Admitting: Physical Medicine and Rehabilitation

## 2021-12-25 ENCOUNTER — Encounter: Payer: Medicare Other | Admitting: Registered Nurse

## 2021-12-25 ENCOUNTER — Encounter (HOSPITAL_COMMUNITY): Payer: Medicare Other

## 2021-12-26 ENCOUNTER — Ambulatory Visit: Payer: Medicare Other | Admitting: Emergency Medicine

## 2021-12-26 ENCOUNTER — Encounter: Payer: Self-pay | Admitting: Emergency Medicine

## 2021-12-26 DIAGNOSIS — J449 Chronic obstructive pulmonary disease, unspecified: Secondary | ICD-10-CM

## 2021-12-26 DIAGNOSIS — R9389 Abnormal findings on diagnostic imaging of other specified body structures: Secondary | ICD-10-CM

## 2021-12-26 NOTE — Assessment & Plan Note (Signed)
Scattered pulmonary infiltrates during the hospitalization in the setting of pneumonia, mostly clear but some residual infiltrate in the right upper lobe obscuring the previously seen nodule.  We will perform a 6-week follow-up, repeat in mid August.

## 2021-12-26 NOTE — Assessment & Plan Note (Signed)
With recent hospitalization for acute on chronic respiratory failure.  Decannulated.  She is using oxygen that she rents herself, has not been formally qualified.  We will try to do so today.  Continue Breztri and albuterol as needed

## 2021-12-26 NOTE — Patient Instructions (Signed)
Please continue Breztri 2 puffs twice a day.  Rinse and gargle after use. Continue Singulair once daily Keep fluticasone and loratadine as needed.  Keep albuterol available use 2 puffs if needed for shortness of breath, chest tightness, wheezing. We will perform a walking oximetry today to see if we can qualify you for supplemental oxygen through your insurance company. We will repeat your CT scan of the chest without contrast in mid August to compare with your hospitalization. Follow Dr. Delton Coombes in August after your CT scan so we can review the results together.

## 2021-12-26 NOTE — Progress Notes (Signed)
Subjective:    Patient ID: [name], female    DOB: [birth year], 66 y.o.   MRN: [id number]  HPI 66 year old woman with a history of former tobacco (90 pack years), severe COPD and a long complicated hospitalization for acute on chronic respiratory failure April through July 2023.  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  She required tracheostomy and prolonged mechanical ventilator weaning.  She was treated for Klebsiella VAP.  Imaging revealed CT chest 7/4 with some areas of improved pneumonia and some areas of worsening fibrotic change particularly in the right upper lobe.  There is also right upper lobe nodular infiltrate that needed follow-up, plan for repeat imaging. Today she reports that she is very limited, in particular by her critical care neuropathy. She has O2 that she is renting herself - has never been formally qualified.  Currently using breztri, albuterol rarely needed. Minimal cough, does have some sinus drainage.  Fluticasone nasal spray as needed, loratadine as needed, Singulair, pantoprazole  CT chest 12/10/2021 shows consolidative and bandlike right upper lobe progressive airspace disease that obscures a previously identified 11 mm right upper lobe nodule, question infectious.  There was improvement in left upper lobe airspace disease, left lower lobe airspace disease.   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     Family History  Problem Relation Age of Onset   Cancer Mother    Heart disease Father    Atrial fibrillation Sister    Heart disease Maternal Grandmother      Social History   Socioeconomic History   Marital status: Married    Spouse name: Not on file   Number of children: Not on file   Years of  education: Not on file   Highest education level: Not on file  Occupational History   Not on file  Tobacco Use   Smoking status: Former    Packs/day: 2.00    Years: 45.00    Total pack years: 90.00    Types: Cigarettes    Quit date: 04/11/2019    Years since quitting: 2.7   Smokeless tobacco: Never  Vaping Use   Vaping Use: Never used  Substance and Sexual Activity   Alcohol use: No   Drug use: Never   Sexual activity: Yes  Other Topics Concern   Not on file  Social History Narrative   Not on file   Social Determinants of Health   Financial Resource Strain: Not on file  Food Insecurity: Not on file  Transportation Needs: Not on file  Physical Activity: Not on file  Stress: Not on file  Social Connections: Not on file  Intimate Partner Violence: Not on file     Not on File   Outpatient Medications Prior to Visit  Medication Sig Dispense Refill   acetaminophen (TYLENOL) 325 MG tablet Take 1-2 tablets (325-650 mg total) by mouth every 4 (four) hours as needed for mild pain.     albuterol (VENTOLIN HFA) 108 (90 Base) MCG/ACT inhaler INHALE 2 PUFFS INTO THE LUNGS EVERY 6 HOURS AS NEEDED FOR WHEEZE/SHORTNESS OF BREATH 36 each 11   ALPRAZolam (XANAX) 0.5 MG tablet Take 1 tablet (0.5 mg total) by mouth daily as needed for anxiety. 10  tablet 0   Budeson-Glycopyrrol-Formoterol (BREZTRI AEROSPHERE) 160-9-4.8 MCG/ACT AERO Inhale 2 puffs into the lungs in the morning and at bedtime. 5.9 g 0   fluticasone (FLONASE) 50 MCG/ACT nasal spray Place 1 spray into both nostrils daily as needed for allergies.     leptospermum manuka honey (MEDIHONEY) PSTE paste Apply 1 Application topically daily. 44 mL 0   loratadine (CLARITIN) 10 MG tablet Take 10 mg by mouth daily as needed for allergies.     metoprolol tartrate (LOPRESSOR) 25 MG tablet Take 0.5 tablets (12.5 mg total) by mouth 2 (two) times daily. 30 tablet 0   montelukast (SINGULAIR) 10 MG tablet Take 1 tablet (10 mg total) by mouth in  the morning. 30 tablet 0   Multiple Vitamin (MULTIVITAMIN WITH MINERALS) TABS tablet Take 1 tablet by mouth daily.     ondansetron (ZOFRAN) 4 MG tablet Take 1 tablet (4 mg total) by mouth 2 (two) times daily as needed for nausea or vomiting. 15 tablet 1   pantoprazole (PROTONIX) 40 MG tablet Take 1 tablet (40 mg total) by mouth daily. 30 tablet 0   polyethylene glycol (MIRALAX / GLYCOLAX) 17 g packet Take 17 g by mouth 2 (two) times daily. Purchase over the counter--need to start it 1-2 times a day to prevent constipation 60 each 0   potassium chloride (KLOR-CON M) 10 MEQ tablet Take 1 tablet (10 mEq total) by mouth 2 (two) times daily. 60 tablet 0   rosuvastatin (CRESTOR) 20 MG tablet Take 1 tablet (20 mg total) by mouth daily. 30 tablet 0   senna-docusate (SENOKOT-S) 8.6-50 MG tablet Take 2 tablets by mouth daily at 6 (six) AM. Purchase over the counter 60 tablet 0   simethicone (MYLICON) 80 MG chewable tablet Chew 1 tablet (80 mg total) by mouth 4 (four) times daily as needed for flatulence. 30 tablet 0   spironolactone (ALDACTONE) 25 MG tablet Take 1 tablet (25 mg total) by mouth daily. 30 tablet 0   traMADol (ULTRAM) 50 MG tablet Take 1 tablet (50 mg total) by mouth 4 (four) times daily -  before meals and at bedtime. 120 tablet 0   traZODone (DESYREL) 50 MG tablet Take 0.5-1 tablets (25-50 mg total) by mouth at bedtime as needed for sleep. 30 tablet 0   white petrolatum (VASELINE) OINT Apply 1 application  topically as needed for lip care.  0   No facility-administered medications prior to visit.         Objective:   Physical Exam  Vitals:   12/26/21 1406  BP: 114/66  Pulse: 71  Temp: 98.7 F (37.1 C)  TempSrc: Oral  SpO2: 99%  Weight: 100 lb (45.4 kg)  Height: 5' (1.524 m)    Gen: Pleasant, well-nourished, in no distress,  normal affect  ENT: No lesions,  mouth clear,  oropharynx clear, no postnasal drip  Neck: No JVD, no stridor, stoma healed  Lungs: No use of  accessory muscles, good air movement, some scattered right-sided crackles, clear on the left  Cardiovascular: RRR, heart sounds normal, no murmur or gallops, no peripheral edema  Musculoskeletal: Wearing bilateral lower extremity braces  Neuro: alert, awake, non focal  Skin: Warm, no lesions or rash      Assessment & Plan:  COPD, severe (HCC) With recent hospitalization for acute on chronic respiratory failure.  Decannulated.  She is using oxygen that she rents herself, has not been formally qualified.  We will try to do so today.  Continue Breztri and  albuterol as needed  Abnormal CT of the chest Scattered pulmonary infiltrates during the hospitalization in the setting of pneumonia, mostly clear but some residual infiltrate in the right upper lobe obscuring the previously seen nodule.  We will perform a 6-week follow-up, repeat in mid August.   Levy Pupa, MD, PhD 12/26/2021, 2:30 PM  [facility] [phone] or if no answer before 7:00PM call [phone] For any issues after 7:00PM please call eLink [phone]

## 2021-12-27 ENCOUNTER — Encounter (HOSPITAL_COMMUNITY): Payer: Medicare Other

## 2022-01-01 ENCOUNTER — Telehealth: Payer: Self-pay | Admitting: Internal Medicine

## 2022-01-01 ENCOUNTER — Encounter (HOSPITAL_COMMUNITY): Payer: Medicare Other

## 2022-01-01 NOTE — Telephone Encounter (Signed)
STAT if HR is under 50 or over 120 (normal HR is 60-100 beats per minute)  What is your heart rate?   Do you have a log of your heart rate readings (document readings)?  Melissa, occupational therapist, states today patient's HR got up to 130 at rest. She states while she saw the patient it did not drop below 125. She states patient reported yesterday, 7/24 her HR WAS 170 just walking to the restroom. She states she has already left the patient, but requested that a call be returned to the patient with any advisement. Do you have any other symptoms?  Tired/Weakness, but no more than usual

## 2022-01-03 ENCOUNTER — Other Ambulatory Visit: Payer: Self-pay | Admitting: Physical Medicine and Rehabilitation

## 2022-01-03 ENCOUNTER — Encounter (HOSPITAL_COMMUNITY): Payer: Medicare Other

## 2022-01-04 ENCOUNTER — Encounter: Payer: Medicare Other | Attending: Registered Nurse | Admitting: Registered Nurse

## 2022-01-04 ENCOUNTER — Encounter: Payer: Self-pay | Admitting: Registered Nurse

## 2022-01-04 VITALS — BP 117/75 | HR 120 | Ht 60.0 in

## 2022-01-04 DIAGNOSIS — G6281 Critical illness polyneuropathy: Secondary | ICD-10-CM | POA: Diagnosis not present

## 2022-01-04 DIAGNOSIS — R5381 Other malaise: Secondary | ICD-10-CM | POA: Diagnosis not present

## 2022-01-04 MED ORDER — METOPROLOL TARTRATE 25 MG PO TABS: 12.5000 mg | ORAL_TABLET | Freq: Two times a day (BID) | ORAL | 0 refills | Status: DC

## 2022-01-04 NOTE — Progress Notes (Unsigned)
Subjective:    Patient ID: [name], female    DOB: 02/03/1956, 66 y.o.   MRN: [id number]  HPI: [name] is a 66 y.o. female who returns for follow up appointment for chronic pain and medication refill. states *** pain is located in  ***. rates pain ***. current exercise regime is walking and performing stretching exercises.     Pain Inventory Average Pain 4 Pain Right Now 6 My pain is constant, dull, and aching  In the last 24 hours, has pain interfered with the following? General activity 1 Relation with others 0 Enjoyment of life 0 What TIME of day is your pain at its worst? evening Sleep (in general) Poor  Pain is worse with: sitting Pain improves with: medication and laying flat Relief from Meds:  good  use a walker ability to climb steps?  no do you drive?  no use a wheelchair needs help with transfers Do you have any goals in this area?  yes  retired I need assistance with the following:  dressing, bathing, toileting, meal prep, household duties, and shopping Do you have any goals in this area?  yes  bladder control problems trouble walking anxiety  Any changes since last visit?  no  Any changes since last visit?  no    Family History  Problem Relation Age of Onset   Cancer Mother    Heart disease Father    Atrial fibrillation Sister    Heart disease Maternal Grandmother    Social History   Socioeconomic History   Marital status: Married    Spouse name: Not on file   Number of children: Not on file   Years of education: Not on file   Highest education level: Not on file  Occupational History   Not on file  Tobacco Use   Smoking status: Former    Packs/day: 2.00    Years: 45.00    Total pack years: 90.00    Types: Cigarettes    Quit date: 04/11/2019    Years since quitting: 2.7   Smokeless tobacco: Never  Vaping Use   Vaping Use: Never used  Substance and Sexual Activity   Alcohol use: No   Drug use: Never   Sexual  activity: Yes  Other Topics Concern   Not on file  Social History Narrative   Not on file   Social Determinants of Health   Financial Resource Strain: Not on file  Food Insecurity: Not on file  Transportation Needs: Not on file  Physical Activity: Not on file  Stress: Not on file  Social Connections: Not on file   Past Surgical History:  Procedure Laterality Date   ABDOMINAL HYSTERECTOMY     BACK SURGERY     ILEO LOOP NEOBLADDER     VESICOVAGINAL FISTULA REPAIR     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   BP 117/75   Pulse (!) 110   Ht 5' (1.524 m)   SpO2 96%   BMI 19.53 kg/m   Opioid Risk Score:   Fall Risk Score:  `1  Depression screen PHQ 2/9     01/04/2022    1:26 PM 09/13/2021    1:48 PM  Depression screen PHQ 2/9  Decreased Interest 1 1  Down, Depressed, Hopeless 1  0  PHQ - 2 Score 2 1  Altered sleeping 3 3  Tired, decreased energy 1 2  Change in appetite 0 0  Feeling bad or failure about yourself  1 0  Trouble concentrating 1 0  Moving slowly or fidgety/restless 1 0  Suicidal thoughts 0 0  PHQ-9 Score 9 6  Difficult doing work/chores  Somewhat difficult    Review of Systems  Cardiovascular:        Elevated heart rate  Gastrointestinal:  Positive for nausea and vomiting.       Incontinence   Genitourinary:        Incontinence   Musculoskeletal:  Positive for back pain and gait problem.       Balance  Psychiatric/Behavioral:         Anxiety  All other systems reviewed and are negative.     Objective:   Physical Exam        Assessment & Plan:

## 2022-01-04 NOTE — Patient Instructions (Addendum)
Call your Primary Care Regarding refills on  Potassium , Crestor   Call our office if you have a problem or send a My-Chart Message   Keep Blood Pressure and Heart Rate : Log  Take with you to the Cardiology appointment.

## 2022-01-08 ENCOUNTER — Encounter (HOSPITAL_COMMUNITY): Payer: Medicare Other

## 2022-01-08 NOTE — Telephone Encounter (Signed)
Patient needs to have an EKG   ? If she is in afib   / flutter  Follows in [place]   I saw her once in 2022

## 2022-01-09 NOTE — Telephone Encounter (Signed)
Pt states that she had stopped taking her lopressor d/t feeling like it was just for her BP. After review with rehab MD pt has since restarted Lopressor. She states that her HR has been around 80-100. No other complaints at this time. She does feel SOB at time but states that she has COPD and pneumonia. Please advise.

## 2022-01-10 ENCOUNTER — Encounter (HOSPITAL_COMMUNITY): Payer: Medicare Other

## 2022-01-10 DIAGNOSIS — R112 Nausea with vomiting, unspecified: Secondary | ICD-10-CM | POA: Diagnosis not present

## 2022-01-10 DIAGNOSIS — N390 Urinary tract infection, site not specified: Secondary | ICD-10-CM | POA: Diagnosis not present

## 2022-01-10 DIAGNOSIS — N760 Acute vaginitis: Secondary | ICD-10-CM | POA: Diagnosis not present

## 2022-01-10 DIAGNOSIS — R109 Unspecified abdominal pain: Secondary | ICD-10-CM | POA: Diagnosis not present

## 2022-01-10 NOTE — Telephone Encounter (Signed)
Pt.notified

## 2022-01-10 NOTE — Telephone Encounter (Signed)
Pt is on a very low dose of metoprolol.   May be helping to keep heart rate controlled   I would continue   I do not think it is contrib to SOB at this dose

## 2022-01-13 NOTE — Progress Notes (Signed)
Office Visit    Patient Name: [name] Date of Encounter: 01/14/2022  PCP:  Everardo Beals, NP   [facility]  Cardiologist:  Dorris Carnes, MD  Advanced Practice Provider:  No care team member to display Electrophysiologist:  None   Chief Complaint    [name] is a 66 y.o. female with a past medical history significant for severe COPD, tobacco abuse, and atherosclerosis of the aorta presents today for follow-up visit.  She was last seen in the office 01/2021 by Dr. Harrington Challenger.  At that time she was followed with pulmonary and referred for shortness of breath.  CT of the chest in June 2022 showed atherosclerosis of the aorta and there was severe central bulbar emphysema noted and stable nodule.  Patient stated she did have some shortness of breath.  She also says she has occasional chest pressure that lasts 5 to 20 minutes.  Most of the time it is mild and not associated with activity.  There has been no change in frequency and she has had it for a while.  She did get short of breath with activity which has been chronic.  There had been no change in this.  She also has occasional heart racing with a little bit of dizziness when that occurs.  No syncope.  Echocardiogram 2015 LVEF normal and RVEF normal.  Today, she states that when she stopped her metoprolol her heart rate was elevated.  Since being back on her metoprolol 12.5 mg twice daily she has not had any elevated heart rates that she has noticed.  She has chronic back pain from degenerative disc disease that she has been dealing with as well as a recent hospitalization for pneumonia.  She was in the hospital for 3 months recovering from a severe case of pneumonia and went through both inpatient and outpatient physical therapy.  She has had occasional dizziness when she lays down at night and turns her head.  She has had off-and-on nausea as well as occasional vomiting.  She plans to go to her primary next week  and they are going to repeat labs to check her electrolytes.  She has had occasional chest pain here and there but it goes away on its own without treatment.  She does have a family history of CAD on her dad side.  I have provided her with some nitroglycerin tabs to take if needed.  If this becomes more frequent or more severe she will definitely need an ischemic workup.   No edema, orthopnea, PND. Reports no palpitations.    Past Medical History    Past Medical History:  Diagnosis Date   Anxiety    Chronic back pain    COPD (chronic obstructive pulmonary disease) (HCC)    Interstitial cystitis    Tobacco abuse    Tracheostomy status ([place]) 11/08/2021   Past Surgical History:  Procedure Laterality Date   ABDOMINAL HYSTERECTOMY     BACK SURGERY     ILEO LOOP NEOBLADDER     VESICOVAGINAL FISTULA REPAIR      Allergies  Not on File   EKGs/Labs/Other Studies Reviewed:   The following studies were reviewed today:  CT of the chest 12/10/2021  FINDINGS: Cardiovascular: Atherosclerosis of the thoracic aorta. No aortic aneurysm. No central pulmonary embolus. The heart is normal in size. No pericardial effusion.   Mediastinum/Nodes: Scattered small mediastinal lymph nodes are all subcentimeter short axis. For example AP window node measures 7 mm  series 3, image 47. 10 mm right hilar node. The soft a Gus is decompressed, previous enteric tube is been removed. De-cannulated tracheostomy tube.   Lungs/Pleura: Bandlike and consolidative right upper lobe airspace disease, some of which is low-density, obscuring the previous 11 mm right upper lobe nodule. There is progression in airspace disease from prior CT. There is improvement in the paramediastinal left upper lobe nodular airspace process. However there is increasing/new peripheral consolidation in the perifissural as well as apical left upper lobe.   Decreased size of right lower lobe nodule, currently 5 mm series 5, image 103,  previously 7 mm by my retrospective measurement. Small perifissural nodular densities in the right upper lobe series 5, image 83 have improved.   The more diffuse tree-in-bud opacities on prior CT have resolved. There is moderate background emphysema. No pleural effusion.   Upper Abdomen: Advanced hepatic steatosis. Prominence of the left renal pelvis is partially included, but was likely present on prior exam. There may be mild left urothelial enhancement.   Musculoskeletal: There are no acute or suspicious osseous abnormalities.   IMPRESSION: 1. Bandlike and consolidative right upper lobe airspace disease has progressed from prior exam, some of which is low-density, obscuring the previous 11 mm right upper lobe nodule. Given the low-density lipoid pneumonia is considered. Continued CT follow-up is recommended, perhaps in 2-3 months. 2. Improvement in the paramediastinal left upper lobe nodular airspace process. However there is increasing/new peripheral consolidation in the perifissural as well as apical left upper lobe, similar in appearance in the density to the right upper lobe process. 3. Lower lobe nodular densities have improved suggesting resolving infectious or inflammatory process. 4. The more diffuse tree-in-bud opacities on prior CT have resolved. 5. Moderate emphysema and bronchial thickening. 6. Shotty mediastinal and right hilar lymph nodes are nonspecific but likely reactive. 7. Advanced hepatic steatosis. 8. Prominence of the left renal pelvis is partially included, but was likely present on prior exam. There may be mild left urothelial enhancement. Recommend correlation with urinalysis.  Echocardiogram 09/27/2021  IMPRESSIONS     1. Limited visualization of the myocardium, incomplete assessment of wall  motion. Anteroseptal wall is akinetic. Apex is akinetic. Marland Kitchen Left  ventricular ejection fraction, by estimation, is 40%. The left ventricle  has mildly  decreased function. The left  ventricle demonstrates regional wall motion abnormalities (see scoring  diagram/findings for description). Left ventricular diastolic parameters  are consistent with Grade I diastolic dysfunction (impaired relaxation).   2. Right ventricular systolic function is normal. The right ventricular  size is normal. There is normal pulmonary artery systolic pressure.   3. The mitral valve is normal in structure. No evidence of mitral valve  regurgitation. No evidence of mitral stenosis.   4. The tricuspid valve is abnormal.   5. The aortic valve is tricuspid. Aortic valve regurgitation is not  visualized. No aortic stenosis is present.   6. The inferior vena cava is normal in size with greater than 50%  respiratory variability, suggesting right atrial pressure of 3 mmHg.   FINDINGS   Left Ventricle: Limited visualization of the myocardium, incomplete  assessment of wall motion. Anteroseptal wall is akinetic. Apex is  akinetic. Left ventricular ejection fraction, by estimation, is 40%. The  left ventricle has mildly decreased function.  The left ventricle demonstrates regional wall motion abnormalities. The  left ventricular internal cavity size was normal in size. There is no left  ventricular hypertrophy. Left ventricular diastolic parameters are  consistent with Grade I  diastolic  dysfunction (impaired relaxation). Normal left ventricular filling  pressure.   Right Ventricle: The right ventricular size is normal. Right vetricular  wall thickness was not assessed. Right ventricular systolic function is  normal. There is normal pulmonary artery systolic pressure. The tricuspid  regurgitant velocity is 2.62 m/s,  and with an assumed right atrial pressure of 8 mmHg, the estimated right  ventricular systolic pressure is [id number] mmHg.   Left Atrium: Left atrial size was normal in size.   Right Atrium: Right atrial size was normal in size.   Pericardium: There is no  evidence of pericardial effusion.   Mitral Valve: The mitral valve is normal in structure. No evidence of  mitral valve regurgitation. No evidence of mitral valve stenosis. MV peak  gradient, 2.8 mmHg. The mean mitral valve gradient is 1.0 mmHg.   Tricuspid Valve: The tricuspid valve is abnormal. Tricuspid valve  regurgitation is mild . No evidence of tricuspid stenosis.   Aortic Valve: LVOT and AV Dopplers are off axis, underestimated VTI and  velocities. The aortic valve is tricuspid. Aortic valve regurgitation is  not visualized. No aortic stenosis is present. Aortic valve mean gradient  measures 1.0 mmHg. Aortic valve  peak gradient measures 2.7 mmHg. Aortic valve area, by VTI measures 2.25  cm.   Pulmonic Valve: The pulmonic valve was not well visualized. Pulmonic valve  regurgitation is not visualized. No evidence of pulmonic stenosis.   Aorta: The aortic root is normal in size and structure.   Venous: The inferior vena cava is normal in size with greater than 50%  respiratory variability, suggesting right atrial pressure of 3 mmHg.   IAS/Shunts: No atrial level shunt detected by color flow Doppler.   EKG:  EKG is  ordered today.  The ekg ordered today demonstrates normal sinus rhythm, rate 73 bpm  Recent Labs: 09/27/2021: B Natriuretic Peptide 1,052.0 11/15/2021: ALT 67 12/04/2021: Magnesium 1.8 12/10/2021: Hemoglobin 9.3; Platelets 643 12/12/2021: BUN 7; Creatinine, Ser 0.35; Potassium 3.7; Sodium 133  Recent Lipid Panel    Component Value Date/Time   CHOL 81 10/16/2021 0327   TRIG 61 10/16/2021 0327   HDL 46 10/16/2021 0327   CHOLHDL 1.8 10/16/2021 0327   VLDL 12 10/16/2021 0327   LDLCALC 23 10/16/2021 0327    Home Medications   Current Meds  Medication Sig   acetaminophen (TYLENOL) 325 MG tablet Take 1-2 tablets (325-650 mg total) by mouth every 4 (four) hours as needed for mild pain.   albuterol (VENTOLIN HFA) 108 (90 Base) MCG/ACT inhaler INHALE 2 PUFFS INTO THE  LUNGS EVERY 6 HOURS AS NEEDED FOR WHEEZE/SHORTNESS OF BREATH   ALPRAZolam (XANAX) 0.5 MG tablet Take 1 tablet (0.5 mg total) by mouth daily as needed for anxiety.   ALPRAZolam (XANAX) 1 MG tablet Take 1 tablet by mouth 2 (two) times daily.   Azelastine-Fluticasone 137-50 MCG/ACT SUSP SPRAY 2 SPRAYS TWICE A DAY BY INTRANASAL ROUTE.   Budeson-Glycopyrrol-Formoterol (BREZTRI AEROSPHERE) 160-9-4.8 MCG/ACT AERO INHALE 2 PUFFS INTO THE LUNGS TWICE A DAY   busPIRone (BUSPAR) 10 MG tablet    escitalopram (LEXAPRO) 10 MG tablet Take 1 tablet every day by oral route for 30 days.   fluconazole (DIFLUCAN) 150 MG tablet Take 1 tablet every day by oral route for 1 day.   fluticasone (FLONASE) 50 MCG/ACT nasal spray Place 1 spray into both nostrils daily as needed for allergies.   ipratropium (ATROVENT HFA) 17 MCG/ACT inhaler INHALE 2 PUFFS 3 TIMES A DAY BY INHALATION  ROUTE AS DIRECTED FOR 7 DAYS.   loratadine-pseudoephedrine (CLARITIN-D 24 HOUR) 10-240 MG 24 hr tablet 1 tablet 3 times a week by oral route.   metoprolol tartrate (LOPRESSOR) 25 MG tablet Take 0.5 tablets (12.5 mg total) by mouth 2 (two) times daily.   montelukast (SINGULAIR) 10 MG tablet Take 1 tablet by mouth daily.   Multiple Vitamin (MULTIVITAMIN WITH MINERALS) TABS tablet Take 1 tablet by mouth daily.   nitrofurantoin, macrocrystal-monohydrate, (MACROBID) 100 MG capsule Take 1 capsule every 12 hours by oral route for 7 days.   omeprazole (PRILOSEC OTC) 20 MG tablet 1 tablet every day by oral route.   pantoprazole (PROTONIX) 40 MG tablet Take 1 tablet by mouth daily.   Phenazopyridine HCl (PYRIDIUM PO) Take by mouth. Per patient taking for UTI   polyethylene glycol (MIRALAX / GLYCOLAX) 17 g packet Take 17 g by mouth 2 (two) times daily. Purchase over the counter--need to start it 1-2 times a day to prevent constipation   potassium chloride (KLOR-CON M10) 10 MEQ tablet Take 1 tablet by mouth 2 (two) times daily.   promethazine (PHENERGAN) 25  MG tablet Take 1 tablet every 4 hours by oral route.   rosuvastatin (CRESTOR) 20 MG tablet Take 1 tablet by mouth daily.   senna-docusate (SENOKOT-S) 8.6-50 MG tablet Take 2 tablets by mouth daily at 6 (six) AM. Purchase over the counter   spironolactone (ALDACTONE) 25 MG tablet Take 1 tablet by mouth daily.   traMADol (ULTRAM) 50 MG tablet Take 1 tablet (50 mg total) by mouth 4 (four) times daily -  before meals and at bedtime.     Review of Systems      All other systems reviewed and are otherwise negative except as noted above.  Physical Exam    VS:  BP 110/60   Pulse 73   Ht 5' (1.524 m)   Wt 97 lb 6.4 oz (44.2 kg)   SpO2 95%   BMI 19.02 kg/m  , BMI Body mass index is 19.02 kg/m.  Wt Readings from Last 3 Encounters:  01/14/22 97 lb 6.4 oz (44.2 kg)  12/26/21 100 lb (45.4 kg)  12/11/21 100 lb 11.2 oz (45.7 kg)     GEN: Well nourished, well developed, in no acute distress. HEENT: normal. Neck: Supple, no JVD, carotid bruits, or masses. Cardiac: RRR, no murmurs, rubs, or gallops. No clubbing, cyanosis, edema.  Radials/PT 2+ and equal bilaterally.  Respiratory:  Respirations regular and unlabored, clear to auscultation bilaterally. GI: Soft, nontender, nondistended. MS: No deformity or atrophy. Skin: Warm and dry, no rash. Neuro:  Strength and sensation are intact. Psych: Normal affect.  Assessment & Plan    Dyspnea -Patient with significant emphysema -recently in the hospital for pneumonia for 3 months  -oxygen was so low she passed out  -Not thought to be anginal equivalent -Volume status has been okay  Atypical chest pain -couple nights ago in her left chest, laying in the bed before sleep  -heart rate was fine and BP was fine -lasted for about an hour, didn't have any nitro to take -"once in a blood moon" it happens -dad had to have bypass surgery so there is a family history of coronary disease -Will provide her with Nitro to take as needed  3.  Pneumonia -She was recently in the hospital for 3 months -Her presenting symptoms were severe SOB, she lost consciousness on the way to the hospital -Was on abx x 14 days -Participated in both inpatient  and outpatient rehab  4. Nausea/Malnutrition -Still having some nausea and vomiting at times -She has been told this is due to her UTI, currently on macrobid  -Will leave to PCP to workup-she sees them next week and they plan to repeat labs to check electrolytes    Disposition: Follow up 2-3 months with Dietrich Pates, MD or APP.  Signed, Sharlene Dory, PA-C 01/14/2022, 4:28 PM [facility]

## 2022-01-14 ENCOUNTER — Encounter: Payer: Self-pay | Admitting: Physician Assistant

## 2022-01-14 ENCOUNTER — Ambulatory Visit: Payer: Medicare Other | Admitting: Physician Assistant

## 2022-01-14 VITALS — BP 110/60 | HR 73 | Ht 60.0 in | Wt 97.4 lb

## 2022-01-14 DIAGNOSIS — R112 Nausea with vomiting, unspecified: Secondary | ICD-10-CM

## 2022-01-14 DIAGNOSIS — R110 Nausea: Secondary | ICD-10-CM

## 2022-01-14 DIAGNOSIS — E46 Unspecified protein-calorie malnutrition: Secondary | ICD-10-CM

## 2022-01-14 DIAGNOSIS — J189 Pneumonia, unspecified organism: Secondary | ICD-10-CM

## 2022-01-14 DIAGNOSIS — R0789 Other chest pain: Secondary | ICD-10-CM

## 2022-01-14 DIAGNOSIS — R0602 Shortness of breath: Secondary | ICD-10-CM | POA: Diagnosis not present

## 2022-01-14 MED ORDER — NITROGLYCERIN 0.4 MG SL SUBL: 0.4000 mg | SUBLINGUAL_TABLET | SUBLINGUAL | 5 refills | Status: DC | PRN

## 2022-01-14 NOTE — Patient Instructions (Signed)
Medication Instructions:  1.Start sublingual nitroglycerin 0.4 mg, as needed for chest pain, max of 3 doses if no relief call 911 *If you need a refill on your cardiac medications before your next appointment, please call your pharmacy*   Lab Work: None If you have labs (blood work) drawn today and your tests are completely normal, you will receive your results only by: MyChart Message (if you have MyChart) OR A paper copy in the mail If you have any lab test that is abnormal or we need to change your treatment, we will call you to review the results.  Follow-Up: At [facility], you and your health needs are our priority.  As part of our continuing mission to provide you with exceptional heart care, we have created designated Provider Care Teams.  These Care Teams include your primary Cardiologist (physician) and Advanced Practice Providers (APPs -  Physician Assistants and Nurse Practitioners) who all work together to provide you with the care you need, when you need it.   Your next appointment:   Next available   The format for your next appointment:   In Person  Provider:   Dietrich Pates, MD{  Important Information About Sugar

## 2022-01-15 ENCOUNTER — Encounter (HOSPITAL_COMMUNITY): Payer: Medicare Other

## 2022-01-15 DIAGNOSIS — J9602 Acute respiratory failure with hypercapnia: Secondary | ICD-10-CM | POA: Diagnosis not present

## 2022-01-15 DIAGNOSIS — G8929 Other chronic pain: Secondary | ICD-10-CM | POA: Diagnosis not present

## 2022-01-15 DIAGNOSIS — L89322 Pressure ulcer of left buttock, stage 2: Secondary | ICD-10-CM | POA: Diagnosis not present

## 2022-01-15 DIAGNOSIS — J440 Chronic obstructive pulmonary disease with acute lower respiratory infection: Secondary | ICD-10-CM | POA: Diagnosis not present

## 2022-01-15 DIAGNOSIS — M21371 Foot drop, right foot: Secondary | ICD-10-CM | POA: Diagnosis not present

## 2022-01-15 DIAGNOSIS — G6281 Critical illness polyneuropathy: Secondary | ICD-10-CM | POA: Diagnosis not present

## 2022-01-15 DIAGNOSIS — J9601 Acute respiratory failure with hypoxia: Secondary | ICD-10-CM | POA: Diagnosis not present

## 2022-01-15 DIAGNOSIS — M21372 Foot drop, left foot: Secondary | ICD-10-CM | POA: Diagnosis not present

## 2022-01-15 DIAGNOSIS — M545 Low back pain, unspecified: Secondary | ICD-10-CM | POA: Diagnosis not present

## 2022-01-15 DIAGNOSIS — I5023 Acute on chronic systolic (congestive) heart failure: Secondary | ICD-10-CM | POA: Diagnosis not present

## 2022-01-15 DIAGNOSIS — D696 Thrombocytopenia, unspecified: Secondary | ICD-10-CM | POA: Diagnosis not present

## 2022-01-15 DIAGNOSIS — I110 Hypertensive heart disease with heart failure: Secondary | ICD-10-CM | POA: Diagnosis not present

## 2022-01-15 DIAGNOSIS — R918 Other nonspecific abnormal finding of lung field: Secondary | ICD-10-CM | POA: Diagnosis not present

## 2022-01-15 DIAGNOSIS — J189 Pneumonia, unspecified organism: Secondary | ICD-10-CM | POA: Diagnosis not present

## 2022-01-15 DIAGNOSIS — I429 Cardiomyopathy, unspecified: Secondary | ICD-10-CM | POA: Diagnosis not present

## 2022-01-15 DIAGNOSIS — F410 Panic disorder [episodic paroxysmal anxiety] without agoraphobia: Secondary | ICD-10-CM | POA: Diagnosis not present

## 2022-01-16 ENCOUNTER — Other Ambulatory Visit: Payer: Self-pay | Admitting: Physical Medicine and Rehabilitation

## 2022-01-17 ENCOUNTER — Encounter (HOSPITAL_COMMUNITY): Payer: Medicare Other

## 2022-01-24 DIAGNOSIS — R197 Diarrhea, unspecified: Secondary | ICD-10-CM | POA: Diagnosis not present

## 2022-01-24 DIAGNOSIS — N390 Urinary tract infection, site not specified: Secondary | ICD-10-CM | POA: Diagnosis not present

## 2022-01-24 DIAGNOSIS — R112 Nausea with vomiting, unspecified: Secondary | ICD-10-CM | POA: Diagnosis not present

## 2022-01-24 DIAGNOSIS — M542 Cervicalgia: Secondary | ICD-10-CM | POA: Diagnosis not present

## 2022-01-26 ENCOUNTER — Other Ambulatory Visit: Payer: Self-pay | Admitting: Registered Nurse

## 2022-01-29 ENCOUNTER — Telehealth: Payer: Self-pay | Admitting: Internal Medicine

## 2022-01-29 NOTE — Telephone Encounter (Signed)
Patient called and said that BP has been running around 90/48 and PCP wants patient to stop taking metformin. What do Dr. Tenny Craw wants her to do.

## 2022-01-29 NOTE — Telephone Encounter (Signed)
Spoke with the patient who reports that her blood pressure has been running 90s/60s for the past few weeks. She states that she does have some dizziness at times. She has been nauseous and possible concern for a UTI. She states that her PCP suggested that she stop taking metoprolol. She has not had any metoprolol since last Thursday. She reports that blood pressures have remained low however heart rate has increased. It will get up to 100 at times.

## 2022-02-04 DIAGNOSIS — G6281 Critical illness polyneuropathy: Secondary | ICD-10-CM | POA: Diagnosis not present

## 2022-02-04 DIAGNOSIS — R5381 Other malaise: Secondary | ICD-10-CM | POA: Diagnosis not present

## 2022-02-05 DIAGNOSIS — N390 Urinary tract infection, site not specified: Secondary | ICD-10-CM | POA: Diagnosis not present

## 2022-02-05 DIAGNOSIS — R197 Diarrhea, unspecified: Secondary | ICD-10-CM | POA: Diagnosis not present

## 2022-02-05 DIAGNOSIS — R531 Weakness: Secondary | ICD-10-CM | POA: Diagnosis not present

## 2022-02-08 ENCOUNTER — Inpatient Hospital Stay
Admission: RE | Admit: 2022-02-08 | Discharge: 2022-02-08 | Disposition: A | Discharge status: Home / Self Care | Payer: Medicare Other | Source: Ambulatory Visit | Attending: Acute Care | Admitting: Acute Care

## 2022-02-08 ENCOUNTER — Other Ambulatory Visit: Payer: Medicare Other

## 2022-02-12 ENCOUNTER — Ambulatory Visit: Payer: Medicare Other | Admitting: Emergency Medicine

## 2022-02-17 ENCOUNTER — Other Ambulatory Visit: Payer: Self-pay | Admitting: Physical Medicine and Rehabilitation

## 2022-02-17 ENCOUNTER — Other Ambulatory Visit: Payer: Self-pay | Admitting: Registered Nurse

## 2022-02-18 ENCOUNTER — Ambulatory Visit
Admission: RE | Admit: 2022-02-18 | Discharge: 2022-02-18 | Disposition: A | Discharge status: Home / Self Care | Payer: Medicare Other | Source: Ambulatory Visit | Attending: Acute Care | Admitting: Acute Care

## 2022-02-18 DIAGNOSIS — J432 Centrilobular emphysema: Secondary | ICD-10-CM | POA: Diagnosis not present

## 2022-02-18 DIAGNOSIS — Z122 Encounter for screening for malignant neoplasm of respiratory organs: Secondary | ICD-10-CM

## 2022-02-18 DIAGNOSIS — I2510 Atherosclerotic heart disease of native coronary artery without angina pectoris: Secondary | ICD-10-CM | POA: Diagnosis not present

## 2022-02-18 DIAGNOSIS — I700 Atherosclerosis of aorta: Secondary | ICD-10-CM | POA: Diagnosis not present

## 2022-02-18 DIAGNOSIS — Z87891 Personal history of nicotine dependence: Secondary | ICD-10-CM | POA: Diagnosis not present

## 2022-02-20 ENCOUNTER — Encounter: Payer: Self-pay | Admitting: Emergency Medicine

## 2022-02-20 ENCOUNTER — Telehealth: Payer: Self-pay | Admitting: Emergency Medicine

## 2022-02-20 ENCOUNTER — Ambulatory Visit: Payer: Medicare Other | Admitting: Emergency Medicine

## 2022-02-20 VITALS — BP 116/68 | HR 77 | Temp 98.3°F | Ht 66.0 in | Wt 97.6 lb

## 2022-02-20 DIAGNOSIS — J449 Chronic obstructive pulmonary disease, unspecified: Secondary | ICD-10-CM | POA: Diagnosis not present

## 2022-02-20 DIAGNOSIS — J309 Allergic rhinitis, unspecified: Secondary | ICD-10-CM | POA: Insufficient documentation

## 2022-02-20 DIAGNOSIS — R9389 Abnormal findings on diagnostic imaging of other specified body structures: Secondary | ICD-10-CM | POA: Diagnosis not present

## 2022-02-20 DIAGNOSIS — Z23 Encounter for immunization: Secondary | ICD-10-CM | POA: Diagnosis not present

## 2022-02-20 DIAGNOSIS — J301 Allergic rhinitis due to pollen: Secondary | ICD-10-CM

## 2022-02-20 MED ORDER — STIOLTO RESPIMAT 2.5-2.5 MCG/ACT IN AERS: 2.0000 | INHALATION_SPRAY | Freq: Every day | RESPIRATORY_TRACT | 5 refills | Status: DC

## 2022-02-20 NOTE — Assessment & Plan Note (Signed)
Pulmonary infiltrates have improved, left with some scar but with slight nodular component.  We will plan to repeat her screening CT in 6 months to look for interval stability.

## 2022-02-20 NOTE — Patient Instructions (Addendum)
We reviewed your CT scan of the chest today. We will plan to repeat lung cancer screening CT scan in March 2024 Stop Breztri We will try starting Stiolto 2 puffs once daily.  Take this medication every day on a schedule as maintenance. Keep your albuterol available to use 2 puffs if you needed for shortness of breath, chest tightness, wheezing. Continue loratadine 10 mg once daily. Keep your fluticasone nasal spray available to use 2 puffs each nostril once daily if needed Check your medications to confirm whether you are actually on Singulair (montelukast) 10 mg each evening. Follow with Dr Delton Coombes in March after your CT scan so we can review together.  Call sooner if you have any problems.

## 2022-02-20 NOTE — Progress Notes (Signed)
Subjective:    Patient ID: [name], female    DOB: 1956/01/12, 66 y.o.   MRN: [id number]  HPI 66 year old woman with a history of former tobacco (90 pack years), severe COPD and a long complicated hospitalization for acute on chronic respiratory failure April through July 2023.  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  She required tracheostomy and prolonged mechanical ventilator weaning.  She was treated for Klebsiella VAP.  Imaging revealed CT chest 7/4 with some areas of improved pneumonia and some areas of worsening fibrotic change particularly in the right upper lobe.  There is also right upper lobe nodular infiltrate that needed follow-up, plan for repeat imaging. Today she reports that she is very limited, in particular by her critical care neuropathy. She has O2 that she is renting herself - has never been formally qualified.  Currently using breztri, albuterol rarely needed. Minimal cough, does have some sinus drainage.  Fluticasone nasal spray as needed, loratadine as needed, Singulair, pantoprazole  CT chest 12/10/2021 shows consolidative and bandlike right upper lobe progressive airspace disease that obscures a previously identified 11 mm right upper lobe nodule, question infectious.  There was improvement in left upper lobe airspace disease, left lower lobe airspace disease.   ROV 02/20/2022 --follow-up visit 66 year old woman with a history of severe COPD, systolic heart failure and associated chronic respiratory failure.  She was hospitalized for acute on chronic respiratory failure from April to July 2023 with a prolonged ICU stay, mechanical ventilation, critical illness polymyopathy/neuropathy.  Now decannulated.  She had residual scattered pulmonary infiltrates obscuring a right upper lobe nodule on CT scan of the chest.  We repeated as below.  She had been on Breztri, not currently taking because she has not been having  dyspnea. No cough or mucous production. Does not have O2 at home, did not qualify with short walk last time or with her PT.  ? On singulair, she is unsure. She is on loratadine. Uses flonase prn.   Lung cancer screening CT chest done 02/18/2022 reviewed by me shows improvement in her bilateral infiltrates but now with extensive architectural distortion and scarring bilaterally.  There is some associated nodularity including 8 mm posterior right middle lobe lesion, 9 mm left upper lobe lesion, 12.7 mm right lower lobe lesion.    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     Family History  Problem Relation Age of Onset   Cancer Mother    Heart disease Father    Atrial fibrillation Sister    Heart disease Maternal Grandmother      Social History   Socioeconomic History   Marital status: Married    Spouse name: Not on file   Number of children: Not on file   Years of education: Not on file   Highest education level: Not on file  Occupational History   Not on file  Tobacco Use   Smoking status: Former    Packs/day: 2.00    Years: 45.00    Total pack years: 90.00    Types: Cigarettes    Quit date: 04/11/2019    Years since quitting: 2.8   Smokeless tobacco: Never  Vaping Use   Vaping Use: Never used  Substance and Sexual Activity   Alcohol use: No   Drug use: Never   Sexual activity: Yes  Other Topics Concern   Not on file  Social History Narrative   Not on file   Social Determinants of Health   Financial Resource Strain: Not on file  Food Insecurity: Not on file  Transportation Needs: Not on file  Physical Activity: Not on file  Stress: Not on file  Social Connections: Not on file  Intimate Partner Violence: Not on file     Not on File   Outpatient Medications  Prior to Visit  Medication Sig Dispense Refill   acetaminophen (TYLENOL) 325 MG tablet Take 1-2 tablets (325-650 mg total) by mouth every 4 (four) hours as needed for mild pain.     albuterol (VENTOLIN HFA) 108 (90 Base) MCG/ACT inhaler INHALE 2 PUFFS INTO THE LUNGS EVERY 6 HOURS AS NEEDED FOR WHEEZE/SHORTNESS OF BREATH 36 each 11   ALPRAZolam (XANAX) 1 MG tablet Take 1 tablet by mouth 2 (two) times daily.     Azelastine-Fluticasone 137-50 MCG/ACT SUSP SPRAY 2 SPRAYS TWICE A DAY BY INTRANASAL ROUTE.     busPIRone (BUSPAR) 10 MG tablet      escitalopram (LEXAPRO) 10 MG tablet Take 1 tablet every day by oral route for 30 days.     fluconazole (DIFLUCAN) 150 MG tablet Take 1 tablet every day by oral route for 1 day.     fluticasone (FLONASE) 50 MCG/ACT nasal spray Place 1 spray into both nostrils daily as needed for allergies.     ipratropium (ATROVENT HFA) 17 MCG/ACT inhaler INHALE 2 PUFFS 3 TIMES A DAY BY INHALATION ROUTE AS DIRECTED FOR 7 DAYS.     leptospermum manuka honey (MEDIHONEY) PSTE paste Apply 1 Application topically daily. 44 mL 0   loratadine-pseudoephedrine (CLARITIN-D 24 HOUR) 10-240 MG 24 hr tablet 1 tablet 3 times a week by oral route.     metoprolol tartrate (LOPRESSOR) 25 MG tablet Take 0.5 tablets (12.5 mg total) by mouth 2 (two) times daily. 30 tablet 0   montelukast (SINGULAIR) 10 MG tablet Take 1 tablet by mouth daily.     Multiple Vitamin (MULTIVITAMIN WITH MINERALS) TABS tablet Take 1 tablet by mouth daily.     nitrofurantoin, macrocrystal-monohydrate, (MACROBID) 100 MG capsule Take 1 capsule every 12 hours by oral route for 7 days.     nitroGLYCERIN (NITROSTAT) 0.4 MG SL tablet Place 1 tablet (0.4 mg total) under the tongue every 5 (five) minutes as needed for chest pain (max 3 doses, if no relief call 911). 25 tablet 5   omeprazole (PRILOSEC OTC) 20 MG tablet 1 tablet every day by oral route.     pantoprazole (PROTONIX) 40 MG tablet Take 1 tablet by mouth daily.      Phenazopyridine HCl (PYRIDIUM PO) Take by mouth. Per patient taking for UTI     polyethylene glycol (MIRALAX / GLYCOLAX) 17 g packet Take 17 g by mouth 2 (two) times daily. Purchase over the counter--need to start it 1-2 times a day to prevent constipation 60 each 0   potassium chloride (KLOR-CON M10) 10 MEQ tablet Take 1 tablet by mouth 2 (two) times daily.     promethazine (PHENERGAN) 25 MG tablet Take 1 tablet every 4 hours by oral route.     rosuvastatin (CRESTOR) 20 MG tablet Take 1 tablet by mouth daily.     senna-docusate (SENOKOT-S) 8.6-50 MG tablet Take 2 tablets by mouth daily at 6 (six) AM. Purchase  over the counter 60 tablet 0   spironolactone (ALDACTONE) 25 MG tablet Take 1 tablet by mouth daily.     traMADol (ULTRAM) 50 MG tablet Take 1 tablet (50 mg total) by mouth 4 (four) times daily -  before meals and at bedtime. 120 tablet 0   ALPRAZolam (XANAX) 0.5 MG tablet Take 1 tablet (0.5 mg total) by mouth daily as needed for anxiety. (Patient not taking: Reported on 02/20/2022) 10 tablet 0   Budeson-Glycopyrrol-Formoterol (BREZTRI AEROSPHERE) 160-9-4.8 MCG/ACT AERO INHALE 2 PUFFS INTO THE LUNGS TWICE A DAY (Patient not taking: Reported on 02/20/2022)     No facility-administered medications prior to visit.         Objective:   Physical Exam  Vitals:   02/20/22 1349  BP: 116/68  Pulse: 77  Temp: 98.3 F (36.8 C)  TempSrc: Oral  SpO2: 97%  Weight: 97 lb 9.6 oz (44.3 kg)  Height: 5\' 6"  (1.676 m)    Gen: Pleasant, well-nourished, in no distress,  normal affect  ENT: No lesions,  mouth clear,  oropharynx clear, no postnasal drip  Neck: No JVD, no stridor, stoma healed  Lungs: No use of accessory muscles, good air movement, some scattered right-sided crackles, clear on the left  Cardiovascular: RRR, heart sounds normal, no murmur or gallops, no peripheral edema  Musculoskeletal: Wearing bilateral lower extremity braces  Neuro: alert, awake, non focal  Skin: Warm, no  lesions or rash      Assessment & Plan:  COPD, severe (HCC) Not currently taking her Breztri, she stopped it because she was not having any dyspnea.  She does not exacerbate frequently, does not have a significant mucus burden so I think I can change her to LABA/LAMA, will try Stiolto.  Explained that she needed to take it reliably as maintenance.   Stop We will try starting Stiolto 2 puffs once daily.  Take this medication every day on a schedule as maintenance. Keep your albuterol available to use 2 puffs if you needed for shortness of breath, chest tightness, wheezing. Follow with Dr Markus Daft in March after your CT scan so we can review together.  Call sooner if you have any problems.  Abnormal CT of the chest Pulmonary infiltrates have improved, left with some scar but with slight nodular component.  We will plan to repeat her screening CT in 6 months to look for interval stability.  Allergic rhinitis She needs to confirm whether she is actually taking the Singulair.  Continue loratadine.  She has fluticasone nasal spray available if needed.   April, MD, PhD 02/20/2022, 2:19 PM [facility] [phone] or if no answer before 7:00PM call [phone] For any issues after 7:00PM please call eLink [phone]

## 2022-02-20 NOTE — Assessment & Plan Note (Signed)
Not currently taking her Markus Daft, she stopped it because she was not having any dyspnea.  She does not exacerbate frequently, does not have a significant mucus burden so I think I can change her to LABA/LAMA, will try Stiolto.  Explained that she needed to take it reliably as maintenance.   Stop Markus Daft We will try starting Stiolto 2 puffs once daily.  Take this medication every day on a schedule as maintenance. Keep your albuterol available to use 2 puffs if you needed for shortness of breath, chest tightness, wheezing. Follow with Dr Delton Coombes in March after your CT scan so we can review together.  Call sooner if you have any problems.

## 2022-02-20 NOTE — Addendum Note (Signed)
Addended by: Dorisann Frames R on: 02/20/2022 03:23 PM   Modules accepted: Orders

## 2022-02-20 NOTE — Telephone Encounter (Signed)
Received call report from Tiffany with [facility] Radiology on patient's LSCT done on 02/18/22. Maralyn Sago, NP please review the result/impression copied below:   IMPRESSION: 1. Lung-RADS 4B, suspicious. Interval development of extensive architectural distortion and scarring, upper lung predominant since prior lung cancer screening CT. Most recent diagnostic CT chest of 12/10/2021 showed extensive airspace disease bilaterally, improved in the interval. Multiple nodular opacities are identified in the lungs today, likely reflecting sequelae of prior infection/inflammation although some are new since the 12/10/2021 exam and measure up to 12.7 mm. Additional imaging evaluation or consultation with Pulmonology or Thoracic Surgery recommended. 2.  Emphysema (ICD10-J43.9) and Aortic Atherosclerosis (ICD10-170.0)  Please advise, thank you.

## 2022-02-20 NOTE — Telephone Encounter (Signed)
French Ana called to give a call report for patient MRN [id number].

## 2022-02-20 NOTE — Telephone Encounter (Signed)
I want it to be a LDCT follow up scan, but we can order it

## 2022-02-20 NOTE — Assessment & Plan Note (Signed)
She needs to confirm whether she is actually taking the Singulair.  Continue loratadine.  She has fluticasone nasal spray available if needed.

## 2022-02-21 NOTE — Telephone Encounter (Signed)
LDCT has been ordered and requested that it will be in done in March 2024. Nothing further needed at this time.

## 2022-02-22 ENCOUNTER — Other Ambulatory Visit: Payer: Self-pay | Admitting: Physical Medicine and Rehabilitation

## 2022-02-26 ENCOUNTER — Encounter: Payer: Self-pay | Admitting: Physical Medicine & Rehabilitation

## 2022-02-26 ENCOUNTER — Encounter: Payer: Medicare Other | Attending: Registered Nurse | Admitting: Physical Medicine & Rehabilitation

## 2022-02-26 VITALS — BP 90/60 | HR 80 | Ht 66.0 in | Wt 101.0 lb

## 2022-02-26 DIAGNOSIS — G6281 Critical illness polyneuropathy: Secondary | ICD-10-CM | POA: Diagnosis not present

## 2022-02-26 NOTE — Progress Notes (Signed)
Subjective:    Patient ID: [name], female    DOB: 04/01/1956, 66 y.o.   MRN: [id number]  HPI 66 yo female with hx of Critical Illness neuropathy resulting from hospitalization for respiratory failure in April Still has weakness in BLE especially when squatting .   No weakness or numbness in hands  Currently receiving HHPT, has a hired Engineer, production ~8h per day for 1 more month per pt report   Seen by pulmonary MD last week , no evidence for pneumonia Still having nausea, poor appetite, PCP recommends GI consult Still trying to gain weight  Pain Inventory Average Pain 0 Pain Right Now 0 My pain is  na  In the last 24 hours, has pain interfered with the following? General activity 0 Relation with others 0 Enjoyment of life 0 What TIME of day is your pain at its worst? na Sleep (in general) Fair  Pain is worse with:  na Pain improves with:  na Relief from Meds:  na  Family History  Problem Relation Age of Onset   Cancer Mother    Heart disease Father    Atrial fibrillation Sister    Heart disease Maternal Grandmother    Social History   Socioeconomic History   Marital status: Married    Spouse name: Not on file   Number of children: Not on file   Years of education: Not on file   Highest education level: Not on file  Occupational History   Not on file  Tobacco Use   Smoking status: Former    Packs/day: 2.00    Years: 45.00    Total pack years: 90.00    Types: Cigarettes    Quit date: 04/11/2019    Years since quitting: 2.8   Smokeless tobacco: Never  Vaping Use   Vaping Use: Never used  Substance and Sexual Activity   Alcohol use: No   Drug use: Never   Sexual activity: Yes  Other Topics Concern   Not on file  Social History Narrative   Not on file   Social Determinants of Health   Financial Resource Strain: Not on file  Food Insecurity: Not on file  Transportation Needs: Not on file  Physical Activity: Not on file  Stress: Not on file  Social  Connections: Not on file   Past Surgical History:  Procedure Laterality Date   ABDOMINAL HYSTERECTOMY     BACK SURGERY     ILEO LOOP NEOBLADDER     VESICOVAGINAL FISTULA REPAIR     Past Surgical History:  Procedure Laterality Date   ABDOMINAL HYSTERECTOMY     BACK SURGERY     ILEO LOOP NEOBLADDER     VESICOVAGINAL FISTULA REPAIR     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   BP 90/60   Pulse 80   Ht 5\' 6"  (1.676 m)   Wt 101 lb (45.8 kg)   SpO2 96%   BMI 16.30 kg/m   Opioid Risk Score:   Fall Risk Score:  `1  Depression screen PHQ 2/9     01/04/2022    1:26 PM 09/13/2021    1:48 PM  Depression screen PHQ 2/9  Decreased Interest 1 1  Down, Depressed, Hopeless 1 0  PHQ - 2 Score 2 1  Altered  sleeping 3 3  Tired, decreased energy 1 2  Change in appetite 0 0  Feeling bad or failure about yourself  1 0  Trouble concentrating 1 0  Moving slowly or fidgety/restless 1 0  Suicidal thoughts 0 0  PHQ-9 Score 9 6  Difficult doing work/chores  Somewhat difficult     Review of Systems     Objective:   Physical Exam Vitals and nursing note reviewed.  Constitutional:      Appearance: She is normal weight.  HENT:     Head: Normocephalic and atraumatic.  Eyes:     Extraocular Movements: Extraocular movements intact.     Conjunctiva/sclera: Conjunctivae normal.     Pupils: Pupils are equal, round, and reactive to light.  Skin:    General: Skin is warm and dry.  Neurological:     Mental Status: She is alert and oriented to person, place, and time.  Psychiatric:        Mood and Affect: Mood normal.        Behavior: Behavior normal.   4/5 in B Knee ext, 3- at bilateral ankle DF  Gait- no assistive device , amb with adequate foot clearance but is unable to heel walk Able to toe walk         Assessment & Plan:  Critical illness neuropathy mainly affecting distal lower ext , no longer with numbness, had a flaccid foot drop during her inpt hospitalization requiring AFO, not using AFOs x 3-4 wks  PMR f/u in 3-6 mo , if no further recovery consider EMG/NCV of BLE

## 2022-03-07 DIAGNOSIS — R5381 Other malaise: Secondary | ICD-10-CM | POA: Diagnosis not present

## 2022-03-07 DIAGNOSIS — G6281 Critical illness polyneuropathy: Secondary | ICD-10-CM | POA: Diagnosis not present

## 2022-04-09 DIAGNOSIS — R300 Dysuria: Secondary | ICD-10-CM | POA: Diagnosis not present

## 2022-04-09 DIAGNOSIS — M6281 Muscle weakness (generalized): Secondary | ICD-10-CM | POA: Diagnosis not present

## 2022-04-09 DIAGNOSIS — L659 Nonscarring hair loss, unspecified: Secondary | ICD-10-CM | POA: Diagnosis not present

## 2022-04-09 DIAGNOSIS — R7401 Elevation of levels of liver transaminase levels: Secondary | ICD-10-CM | POA: Diagnosis not present

## 2022-04-09 DIAGNOSIS — D72829 Elevated white blood cell count, unspecified: Secondary | ICD-10-CM | POA: Diagnosis not present

## 2022-04-09 LAB — HISTOPLASMA ANTIGEN, URINE: Histoplasma Antigen, urine: <0.5 (ref <0.5)

## 2022-04-18 DIAGNOSIS — L650 Telogen effluvium: Secondary | ICD-10-CM | POA: Diagnosis not present

## 2022-05-07 NOTE — Progress Notes (Signed)
Cardiology Office Note   Date:  05/10/2022   ID:  [name], DOB [birth year], MRN [id number]  PCP:  Marva Panda, NP  Cardiologist:   Dietrich Pates, MD   Patient referred for evaluation of SOB      History of Present Illness: [name] is a 66 y.o. female with a history of severe COPD, tobacco abuse   She is followed in pulmonary   Referred for SOB CT of chest in June showed atherosclerosis of the aorta   There was severe centrilobular emphysema noted and a stable nodule     The pt says she does have some SOB   SHe also says that she will have occcasional chest pressure  that last 5 to 20 min most   Mild  Not associated with activity   NO change in frequency  Has had for awhile She does get SOB with activity   Has been chronic   No change She has occasional heart racing   WIll be a little dizzy with this when occurs   None recently   No syncope       Echo in 2015 LVEF normal   RVEF normal   I saw the pt in Aug 2022   She has been seen by Margaretha Glassing in Aug 2023    Since seen she says she is still recovering from hospitalization   Therapy has been discontinued   Still can't drive   Still with problems of drop foot  Pt says her HR is ok most of the time   HR will go up some   Has metoprolol to take as neede   WIll take 1/2 per day No CP    No wheezing     Appt with [facility] in 2024   Current Meds  Medication Sig   albuterol (VENTOLIN HFA) 108 (90 Base) MCG/ACT inhaler INHALE 2 PUFFS INTO THE LUNGS EVERY 6 HOURS AS NEEDED FOR WHEEZE/SHORTNESS OF BREATH   ALPRAZolam (XANAX) 1 MG tablet Take 1 tablet by mouth 2 (two) times daily.   Azelastine-Fluticasone 137-50 MCG/ACT SUSP SPRAY 2 SPRAYS TWICE A DAY BY INTRANASAL ROUTE.   escitalopram (LEXAPRO) 10 MG tablet Take 1 tablet every day by oral route for 30 days.   fluconazole (DIFLUCAN) 150 MG tablet Take 1 tablet every day by oral route for 1 day.   fluticasone (FLONASE) 50 MCG/ACT nasal spray Place 1 spray into both nostrils  daily as needed for allergies.   ipratropium (ATROVENT HFA) 17 MCG/ACT inhaler INHALE 2 PUFFS 3 TIMES A DAY BY INHALATION ROUTE AS DIRECTED FOR 7 DAYS.   loratadine-pseudoephedrine (CLARITIN-D 24 HOUR) 10-240 MG 24 hr tablet 1 tablet 3 times a week by oral route.   metoprolol tartrate (LOPRESSOR) 25 MG tablet Take 0.5 tablets (12.5 mg total) by mouth 2 (two) times daily.   montelukast (SINGULAIR) 10 MG tablet Take 1 tablet by mouth daily.   Multiple Vitamin (MULTIVITAMIN WITH MINERALS) TABS tablet Take 1 tablet by mouth daily.   omeprazole (PRILOSEC OTC) 20 MG tablet 1 tablet every day by oral route.   pantoprazole (PROTONIX) 40 MG tablet Take 1 tablet by mouth daily.   potassium chloride (KLOR-CON M10) 10 MEQ tablet Take 1 tablet by mouth 2 (two) times daily.   promethazine (PHENERGAN) 25 MG tablet Take 1 tablet every 4 hours by oral route.   rosuvastatin (CRESTOR) 20 MG tablet Take 1 tablet by mouth daily.   spironolactone (ALDACTONE) 25 MG tablet Take  1 tablet by mouth daily.   Tiotropium Bromide-Olodaterol (STIOLTO RESPIMAT) 2.5-2.5 MCG/ACT AERS Inhale 2 puffs into the lungs daily.   traMADol (ULTRAM) 50 MG tablet Take 1 tablet (50 mg total) by mouth 4 (four) times daily -  before meals and at bedtime.     Allergies:   Patient has no allergy information on record.   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 Her smoking use included cigarettes. She has a 90.00 pack-year smoking history. She has never used smokeless tobacco. She reports that she does not drink alcohol and does not use  drugs.   Family History:  The patient's family history includes Atrial fibrillation in her sister; Cancer in her mother; Heart disease in her father and maternal grandmother.    ROS:  Please see the history of present illness. All other systems are reviewed and  Negative to the above problem except as noted.    PHYSICAL EXAM: VS:  BP 124/76   Pulse 78   Ht 5\' 1"  (1.549 m)   Wt 115 lb (52.2 kg)   SpO2 99%   BMI 21.73 kg/m   GEN: Thin 66 yo in no acute distress  HEENT: normal  Neck: no JVD, carotid bruits Cardiac: RRR; no murmurs,  No LE edema  Respiratory:  Decreased airflow   No wheezes GI: soft, nontender, nondistended, + BS  No hepatomegaly  MS: no deformity Moving all extremities   Skin: warm and dry, no rash Neuro:  Strength and sensation are intact Psych: euthymic mood, full affect   EKG:  EKG is not  ordered today.    Lipid Panel    Component Value Date/Time   CHOL 81 10/16/2021 0327   TRIG 61 10/16/2021 0327   HDL 46 10/16/2021 0327   CHOLHDL 1.8 10/16/2021 0327   VLDL 12 10/16/2021 0327   LDLCALC 23 10/16/2021 0327      Wt Readings from Last 3 Encounters:  05/10/22 115 lb (52.2 kg)  02/26/22 101 lb (45.8 kg)  02/20/22 97 lb 9.6 oz (44.3 kg)      ASSESSMENT AND PLAN:  1  Dyspnea.   Pt with signficant COPD    I do not think dyspnea is cardiac    Breathing is stable   2  Atherosclerosis   Scatter calcifications seen on CT of abdominal aorta   Control risk facotrs  3  CP   PT denies   4   HTN  Controlled on current regimen  5  HL   Will get lipids from Dr 02/22/22  ON Crestor    Follow up next fall Current medicines are reviewed at length with the patient today.  The patient does not have concerns regarding medicines.  Signed, Fredrik Cove, MD  05/10/2022 2:08 PM    [facility] [address] [place], [place], [place]  [place] Phone: [phone]; Fax: [phone]

## 2022-05-10 ENCOUNTER — Encounter: Payer: Self-pay | Admitting: Internal Medicine

## 2022-05-10 ENCOUNTER — Ambulatory Visit: Payer: Medicare Other | Attending: Internal Medicine | Admitting: Internal Medicine

## 2022-05-10 VITALS — BP 124/76 | HR 78 | Ht 61.0 in | Wt 115.0 lb

## 2022-05-10 DIAGNOSIS — I10 Essential (primary) hypertension: Secondary | ICD-10-CM | POA: Diagnosis not present

## 2022-05-10 NOTE — Patient Instructions (Signed)
Medication Instructions:  Your physician recommends that you continue on your current medications as directed. Please refer to the Current Medication list given to you today.   Labwork: None today  Testing/Procedures: None today  Follow-Up: September/October Dr.Ross  Any Other Special Instructions Will Be Listed Below (If Applicable).  If you need a refill on your cardiac medications before your next appointment, please call your pharmacy.

## 2022-06-20 ENCOUNTER — Encounter: Payer: Self-pay | Admitting: Physical Medicine & Rehabilitation

## 2022-06-20 ENCOUNTER — Encounter: Payer: Medicare Other | Attending: Physical Medicine & Rehabilitation | Admitting: Physical Medicine & Rehabilitation

## 2022-06-20 VITALS — BP 113/72 | HR 65 | Ht 61.0 in | Wt 114.6 lb

## 2022-06-20 DIAGNOSIS — G6281 Critical illness polyneuropathy: Secondary | ICD-10-CM | POA: Diagnosis not present

## 2022-06-20 NOTE — Patient Instructions (Signed)
Achilles Tendon Tendinitis  Stretching and range-of-motion exercises These exercises: Warm up your muscles and joints. Improve the movement and flexibility of your lower leg and heel. Relieve pain and stiffness. Gastrocnemius and soleus stretch This exercise is also called a calf stretch. It stretches the muscles in the back of the lower leg. These are the gastrocnemius (closest to the skin) and the soleus (deep in the leg). Sit on the floor with your left / right leg extended. Loop a belt or towel around your left / right foot, across the top part of the walking surface (ball) of your foot. Keep your left / right ankle and foot relaxed and keep your knee straight while you use the belt or towel to pull your foot and ankle toward you. You should feel a gentle stretch behind your calf or knee. Hold this position for __________ seconds. Repeat __________ times. Complete this exercise __________ times a day. Strengthening exercises These exercises build strength and endurance in your lower leg. Endurance is the ability to use your muscles for a long time, even after they get tired. Plantar flexion with a band  Sit on the floor with your left / right leg extended. Loop a rubber exercise band or tube around your left / right foot, across the top part of the walking surface (ball) of your foot. Hold the two ends in your hands. The band or tube should be slightly tense when your foot is relaxed. Slowly point your toes downward, pushing them away from you (plantar flexion). Hold this position for __________ seconds. Slowly return your foot back to the starting position. Repeat __________ times. Complete this exercise __________ times a day.    . Document Revised: 07/18/2020 Document Reviewed: 07/18/2020 Elsevier Patient Education  [place].

## 2022-06-20 NOTE — Progress Notes (Signed)
Subjective:    Patient ID: [name], female    DOB: Jun 01, 1956, 67 y.o.   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    Patient had some improvement in bilateral foot drop since hospitalization when I saw her in outpatient clinic on 02/26/2022.  Patient back for follow-up to see if she has had further recovery and if not consider EMG/NCV of bilateral lower extremities.  Patient quit using AFOs sometime around early September.  Now able to squat and get back up  The patient has also practiced heel walking and toe walking. She still gets very fatigued.  She has to help her husband who has spine metastases and is going through chemotherapy.  Having pain at Left posterior heel, no falls or trauma to that area does not feel numb   Pain 3 Pain Right Now 4 My pain is dull, tingling, and aching  LOCATION OF PAIN  back, ankle  BOWEL Number of stools per week: 2 Oral laxative use No  Type of laxative . Enema or suppository use No  History of colostomy No  Incontinent No   BLADDER Normal In and out cath, frequency . Able to self cath  . Bladder incontinence Yes  Frequent urination No  Leakage with coughing No  Difficulty starting stream No  Incomplete bladder emptying No    Mobility how many minutes can you walk? 3 ability to climb steps?  yes do you drive?  yes  Function retired I need assistance with the following:  household duties and shopping  Neuro/Psych bladder control problems weakness tingling  Prior Studies Any changes since last visit?  no  Physicians involved in your care Any changes since last visit?  no   Family History  Problem Relation Age of Onset   Cancer Mother    Heart disease Father    Atrial fibrillation Sister    Heart disease Maternal Grandmother    Social History    Socioeconomic History   Marital status: Married    Spouse name: Not on file   Number of children: Not on file   Years of education: Not on file   Highest education level: Not on file  Occupational History   Not on file  Tobacco Use   Smoking status: Former    Packs/day: 2.00    Years: 45.00    Total pack years: 90.00    Types: Cigarettes    Quit date: 04/11/2019    Years since quitting: 3.1   Smokeless tobacco: Never  Vaping Use   Vaping Use: Never used  Substance and Sexual Activity   Alcohol use: No   Drug use: Never   Sexual activity: Yes  Other Topics Concern   Not on file  Social History Narrative   Not on file   Social Determinants of Health   Financial Resource Strain: Not on file  Food Insecurity: Not on file  Transportation Needs: Not on file  Physical Activity: Not on file  Stress: Not on file  Social Connections: Not on file   Past Surgical History:  Procedure Laterality Date   ABDOMINAL HYSTERECTOMY     BACK SURGERY     ILEO LOOP NEOBLADDER     VESICOVAGINAL FISTULA REPAIR     Past Medical History:  Diagnosis Date   Anxiety    Chronic back pain  COPD (chronic obstructive pulmonary disease) (HCC)    Interstitial cystitis    Tobacco abuse    Tracheostomy status ([place]) 11/08/2021   BP 113/72   Pulse 65   Ht 5\' 1"  (1.549 m)   Wt 114 lb 9.6 oz (52 kg)   SpO2 97%   BMI 21.65 kg/m   Opioid Risk Score:   Fall Risk Score:  `1  Depression screen [facility] 2/9     01/04/2022    1:26 PM 09/13/2021    1:48 PM  Depression screen PHQ 2/9  Decreased Interest 1 1  Down, Depressed, Hopeless 1 0  PHQ - 2 Score 2 1  Altered sleeping 3 3  Tired, decreased energy 1 2  Change in appetite 0 0  Feeling bad or failure about yourself  1 0  Trouble concentrating 1 0  Moving slowly or fidgety/restless 1 0  Suicidal thoughts 0 0  PHQ-9 Score 9 6  Difficult doing work/chores  Somewhat difficult     Review of Systems  Musculoskeletal:  Positive for back  pain.  Neurological:  Positive for weakness.  All other systems reviewed and are negative.     Objective:   Physical Exam  Able to heel walk and toe walk  Tenderness to palpation Left achiiles insertion no erythema or deformities negative Thompson's  Sensation intact to light touch and pinprick bilateral lower limbs Motor strength is 5/5 bilateral hip flexor knee extensor right ankle dorsiflexor and plantar flexor 5/5 left ankle plantar flexor and 4/5 left ankle dorsiflexor.      Assessment & Plan:   1.  History of critical illness neuropathy with bilateral foot drop this has been improving.  No need for EMG/NCV.  Continue with home health PT.  Have given patient Achilles stretching exercises as well.  Anticipate further recovery of the next 3 to 6 months. Physical medicine rehab follow-up on as-needed basis.

## 2022-06-22 DIAGNOSIS — Z7951 Long term (current) use of inhaled steroids: Secondary | ICD-10-CM | POA: Diagnosis not present

## 2022-06-22 DIAGNOSIS — L659 Nonscarring hair loss, unspecified: Secondary | ICD-10-CM | POA: Diagnosis not present

## 2022-06-22 DIAGNOSIS — F419 Anxiety disorder, unspecified: Secondary | ICD-10-CM | POA: Diagnosis not present

## 2022-06-22 DIAGNOSIS — G8929 Other chronic pain: Secondary | ICD-10-CM | POA: Diagnosis not present

## 2022-06-22 DIAGNOSIS — D72829 Elevated white blood cell count, unspecified: Secondary | ICD-10-CM | POA: Diagnosis not present

## 2022-06-22 DIAGNOSIS — Z87891 Personal history of nicotine dependence: Secondary | ICD-10-CM | POA: Diagnosis not present

## 2022-06-22 DIAGNOSIS — G629 Polyneuropathy, unspecified: Secondary | ICD-10-CM | POA: Diagnosis not present

## 2022-06-22 DIAGNOSIS — M549 Dorsalgia, unspecified: Secondary | ICD-10-CM | POA: Diagnosis not present

## 2022-06-22 DIAGNOSIS — I10 Essential (primary) hypertension: Secondary | ICD-10-CM | POA: Diagnosis not present

## 2022-06-22 DIAGNOSIS — Z9181 History of falling: Secondary | ICD-10-CM | POA: Diagnosis not present

## 2022-06-22 DIAGNOSIS — J440 Chronic obstructive pulmonary disease with acute lower respiratory infection: Secondary | ICD-10-CM | POA: Diagnosis not present

## 2022-06-22 DIAGNOSIS — M6281 Muscle weakness (generalized): Secondary | ICD-10-CM | POA: Diagnosis not present

## 2022-06-22 DIAGNOSIS — J189 Pneumonia, unspecified organism: Secondary | ICD-10-CM | POA: Diagnosis not present

## 2022-06-22 DIAGNOSIS — Z87440 Personal history of urinary (tract) infections: Secondary | ICD-10-CM | POA: Diagnosis not present

## 2022-06-22 DIAGNOSIS — R634 Abnormal weight loss: Secondary | ICD-10-CM | POA: Diagnosis not present

## 2022-06-22 DIAGNOSIS — Z6822 Body mass index (BMI) 22.0-22.9, adult: Secondary | ICD-10-CM | POA: Diagnosis not present

## 2022-06-26 DIAGNOSIS — Z9181 History of falling: Secondary | ICD-10-CM | POA: Diagnosis not present

## 2022-06-26 DIAGNOSIS — J440 Chronic obstructive pulmonary disease with acute lower respiratory infection: Secondary | ICD-10-CM | POA: Diagnosis not present

## 2022-06-26 DIAGNOSIS — G8929 Other chronic pain: Secondary | ICD-10-CM | POA: Diagnosis not present

## 2022-06-26 DIAGNOSIS — M6281 Muscle weakness (generalized): Secondary | ICD-10-CM | POA: Diagnosis not present

## 2022-06-26 DIAGNOSIS — Z87891 Personal history of nicotine dependence: Secondary | ICD-10-CM | POA: Diagnosis not present

## 2022-06-26 DIAGNOSIS — D72829 Elevated white blood cell count, unspecified: Secondary | ICD-10-CM | POA: Diagnosis not present

## 2022-06-26 DIAGNOSIS — R634 Abnormal weight loss: Secondary | ICD-10-CM | POA: Diagnosis not present

## 2022-06-26 DIAGNOSIS — L659 Nonscarring hair loss, unspecified: Secondary | ICD-10-CM | POA: Diagnosis not present

## 2022-06-26 DIAGNOSIS — Z87440 Personal history of urinary (tract) infections: Secondary | ICD-10-CM | POA: Diagnosis not present

## 2022-06-26 DIAGNOSIS — G629 Polyneuropathy, unspecified: Secondary | ICD-10-CM | POA: Diagnosis not present

## 2022-06-26 DIAGNOSIS — Z7951 Long term (current) use of inhaled steroids: Secondary | ICD-10-CM | POA: Diagnosis not present

## 2022-06-26 DIAGNOSIS — M549 Dorsalgia, unspecified: Secondary | ICD-10-CM | POA: Diagnosis not present

## 2022-06-26 DIAGNOSIS — I10 Essential (primary) hypertension: Secondary | ICD-10-CM | POA: Diagnosis not present

## 2022-06-26 DIAGNOSIS — F419 Anxiety disorder, unspecified: Secondary | ICD-10-CM | POA: Diagnosis not present

## 2022-06-26 DIAGNOSIS — Z6822 Body mass index (BMI) 22.0-22.9, adult: Secondary | ICD-10-CM | POA: Diagnosis not present

## 2022-06-26 DIAGNOSIS — J189 Pneumonia, unspecified organism: Secondary | ICD-10-CM | POA: Diagnosis not present

## 2022-06-28 ENCOUNTER — Encounter: Payer: Medicare Other | Admitting: Physical Medicine & Rehabilitation

## 2022-07-08 DIAGNOSIS — Z7951 Long term (current) use of inhaled steroids: Secondary | ICD-10-CM | POA: Diagnosis not present

## 2022-07-08 DIAGNOSIS — Z6822 Body mass index (BMI) 22.0-22.9, adult: Secondary | ICD-10-CM | POA: Diagnosis not present

## 2022-07-08 DIAGNOSIS — L659 Nonscarring hair loss, unspecified: Secondary | ICD-10-CM | POA: Diagnosis not present

## 2022-07-08 DIAGNOSIS — J440 Chronic obstructive pulmonary disease with acute lower respiratory infection: Secondary | ICD-10-CM | POA: Diagnosis not present

## 2022-07-08 DIAGNOSIS — M549 Dorsalgia, unspecified: Secondary | ICD-10-CM | POA: Diagnosis not present

## 2022-07-08 DIAGNOSIS — J189 Pneumonia, unspecified organism: Secondary | ICD-10-CM | POA: Diagnosis not present

## 2022-07-08 DIAGNOSIS — Z87891 Personal history of nicotine dependence: Secondary | ICD-10-CM | POA: Diagnosis not present

## 2022-07-08 DIAGNOSIS — I10 Essential (primary) hypertension: Secondary | ICD-10-CM | POA: Diagnosis not present

## 2022-07-08 DIAGNOSIS — G629 Polyneuropathy, unspecified: Secondary | ICD-10-CM | POA: Diagnosis not present

## 2022-07-08 DIAGNOSIS — Z87440 Personal history of urinary (tract) infections: Secondary | ICD-10-CM | POA: Diagnosis not present

## 2022-07-08 DIAGNOSIS — Z9181 History of falling: Secondary | ICD-10-CM | POA: Diagnosis not present

## 2022-07-08 DIAGNOSIS — F419 Anxiety disorder, unspecified: Secondary | ICD-10-CM | POA: Diagnosis not present

## 2022-07-08 DIAGNOSIS — R634 Abnormal weight loss: Secondary | ICD-10-CM | POA: Diagnosis not present

## 2022-07-08 DIAGNOSIS — G8929 Other chronic pain: Secondary | ICD-10-CM | POA: Diagnosis not present

## 2022-07-08 DIAGNOSIS — M6281 Muscle weakness (generalized): Secondary | ICD-10-CM | POA: Diagnosis not present

## 2022-07-08 DIAGNOSIS — D72829 Elevated white blood cell count, unspecified: Secondary | ICD-10-CM | POA: Diagnosis not present

## 2022-07-16 DIAGNOSIS — M549 Dorsalgia, unspecified: Secondary | ICD-10-CM | POA: Diagnosis not present

## 2022-07-16 DIAGNOSIS — R5383 Other fatigue: Secondary | ICD-10-CM | POA: Diagnosis not present

## 2022-07-19 DIAGNOSIS — N390 Urinary tract infection, site not specified: Secondary | ICD-10-CM | POA: Diagnosis not present

## 2022-07-19 DIAGNOSIS — M545 Low back pain, unspecified: Secondary | ICD-10-CM | POA: Diagnosis not present

## 2022-08-07 ENCOUNTER — Encounter: Payer: Self-pay | Admitting: Emergency Medicine

## 2022-08-12 ENCOUNTER — Other Ambulatory Visit: Payer: Medicare Other

## 2022-08-12 ENCOUNTER — Ambulatory Visit
Admission: RE | Admit: 2022-08-12 | Discharge: 2022-08-12 | Disposition: A | Discharge status: Home / Self Care | Payer: Medicare Other | Source: Ambulatory Visit | Attending: Emergency Medicine | Admitting: Emergency Medicine

## 2022-08-12 DIAGNOSIS — J432 Centrilobular emphysema: Secondary | ICD-10-CM | POA: Diagnosis not present

## 2022-08-12 DIAGNOSIS — I700 Atherosclerosis of aorta: Secondary | ICD-10-CM | POA: Diagnosis not present

## 2022-08-12 DIAGNOSIS — Z87891 Personal history of nicotine dependence: Secondary | ICD-10-CM | POA: Diagnosis not present

## 2022-08-12 DIAGNOSIS — R918 Other nonspecific abnormal finding of lung field: Secondary | ICD-10-CM | POA: Diagnosis not present

## 2022-08-12 DIAGNOSIS — R9389 Abnormal findings on diagnostic imaging of other specified body structures: Secondary | ICD-10-CM

## 2022-08-27 ENCOUNTER — Ambulatory Visit: Payer: Medicare Other | Admitting: Emergency Medicine

## 2022-09-03 IMAGING — DX DG CHEST 2V
2 series · 2 of 2 positions shown · non-contrast
Comparison: August 16, 19.

CLINICAL DATA: shortness of breath

EXAM:
CHEST - 2 VIEW

[chest lat]
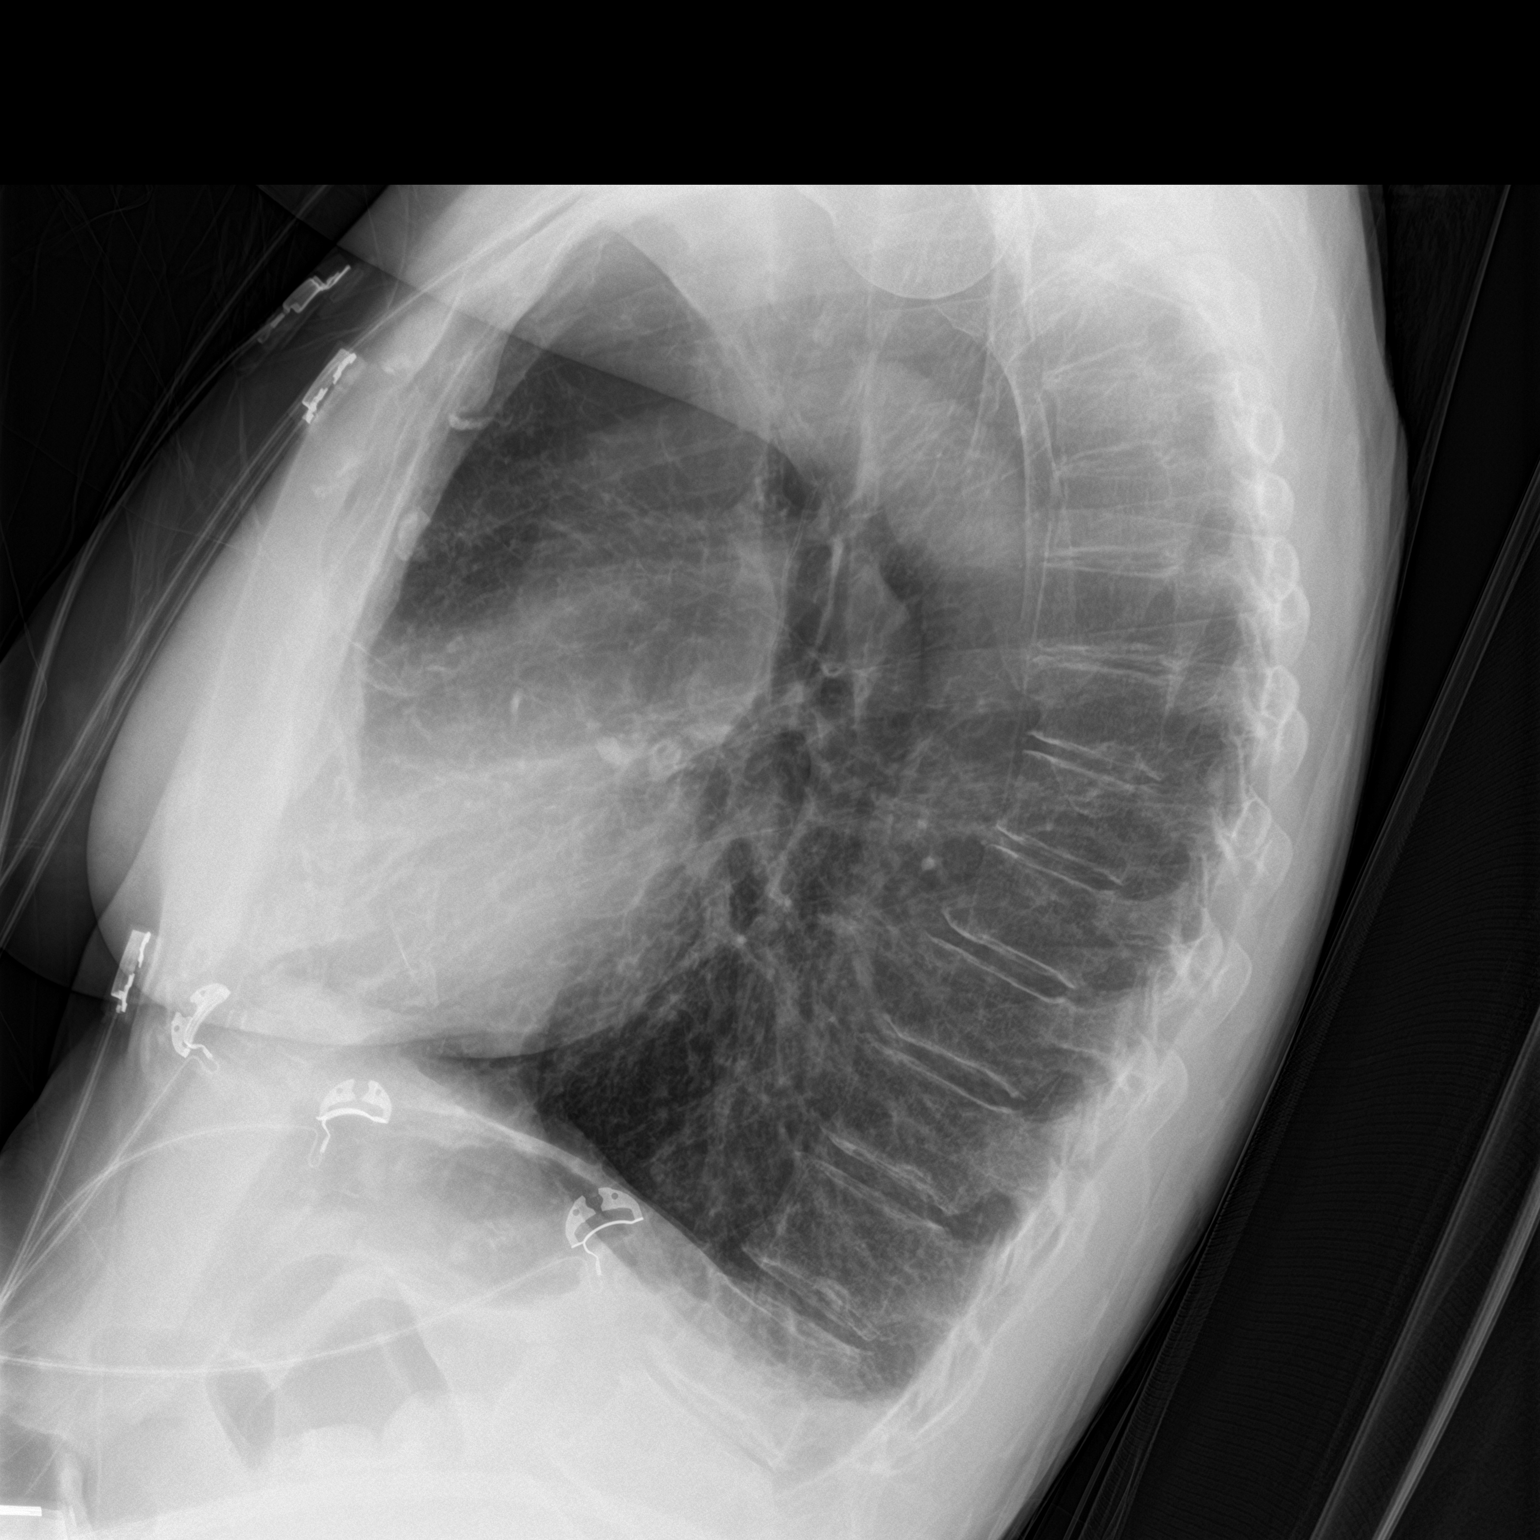

[chest ap]
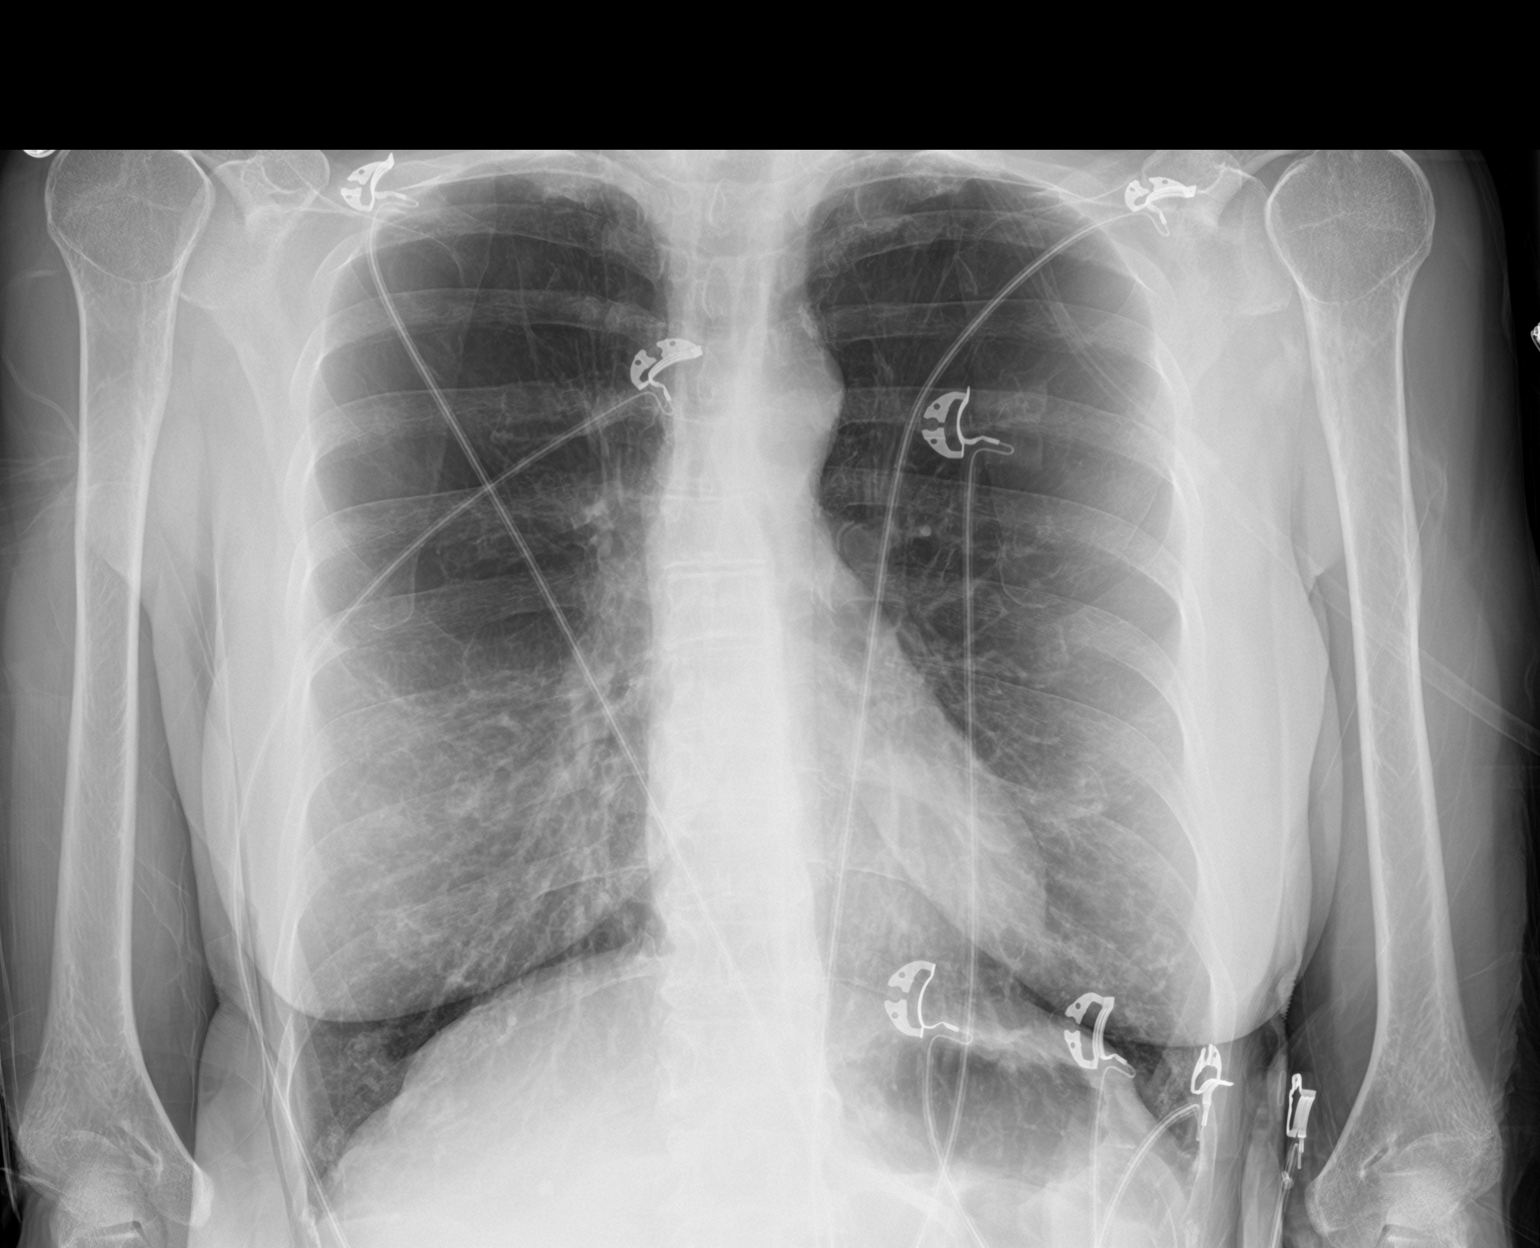

[2 of 2 positions shown; findings below may reference images not displayed]

FINDINGS: Emphysema. Chronically prominent lung markings at the lung bases. No
consolidation. No visible pleural effusions or pneumothorax.
Cardiomediastinal silhouette is within normal limits and similar to
prior. No displaced fracture.
IMPRESSION: 1. No evidence of acute cardiopulmonary disease.
2. Emphysema ([id number]).

## 2022-09-13 DIAGNOSIS — G6281 Critical illness polyneuropathy: Secondary | ICD-10-CM | POA: Diagnosis not present

## 2022-09-13 DIAGNOSIS — R5381 Other malaise: Secondary | ICD-10-CM | POA: Diagnosis not present

## 2022-09-24 ENCOUNTER — Ambulatory Visit: Payer: Medicare Other | Admitting: Emergency Medicine

## 2022-09-24 ENCOUNTER — Encounter: Payer: Self-pay | Admitting: Emergency Medicine

## 2022-09-24 VITALS — BP 112/66 | HR 61 | Temp 97.9°F | Ht 60.0 in | Wt 111.6 lb

## 2022-09-24 DIAGNOSIS — J301 Allergic rhinitis due to pollen: Secondary | ICD-10-CM

## 2022-09-24 DIAGNOSIS — K219 Gastro-esophageal reflux disease without esophagitis: Secondary | ICD-10-CM

## 2022-09-24 DIAGNOSIS — R918 Other nonspecific abnormal finding of lung field: Secondary | ICD-10-CM | POA: Diagnosis not present

## 2022-09-24 DIAGNOSIS — J449 Chronic obstructive pulmonary disease, unspecified: Secondary | ICD-10-CM

## 2022-09-24 NOTE — Assessment & Plan Note (Signed)
Continue Claritin as you have been taking it Continue azelastine/fluticasone nasal spray as you have been taking it Continue Singulair each evening.

## 2022-09-24 NOTE — Assessment & Plan Note (Signed)
Actually quite well compensated at this time following fairly devastating hospitalization in mid 2023.  No longer on supplemental oxygen.  Doing well without daily symptoms.  Minimal albuterol use.  Continue to manage on Stiolto.  If her exertional tolerance changes then would repeat her ambulatory oximetry to ensure that she is not desaturating.   Please continue Stiolto 2 puffs once daily. Keep albuterol available to use 2 puffs up to every 4 hours if needed for shortness of breath, chest tightness, wheezing.  Follow Dr. Delton Coombes in 1 year or sooner if you have any problems.

## 2022-09-24 NOTE — Assessment & Plan Note (Signed)
Following persistent scarring superimposed on nodular disease since her hospitalization for severe pneumonia.  Her most recent lung cancer screening CT showed improvement in both scarring and nodules.  38-month follow-up was recommended, will perform in September 2024

## 2022-09-24 NOTE — Assessment & Plan Note (Signed)
Continue pantoprazole once daily

## 2022-09-24 NOTE — Patient Instructions (Addendum)
We reviewed your CT scan of the chest today. We will perform a repeat lung cancer screening CT chest in 6 months (September 2024) Please continue Stiolto 2 puffs once daily. Keep albuterol available to use 2 puffs up to every 4 hours if needed for shortness of breath, chest tightness, wheezing.  Continue Claritin as you have been taking it Continue azelastine/fluticasone nasal spray as you have been taking it Continue Singulair each evening Continue pantoprazole once daily Follow Dr. Delton Coombes in 1 year or sooner if you have any problems.

## 2022-09-24 NOTE — Progress Notes (Signed)
   Subjective:    Patient ID: [name], female    DOB: 1956-03-22, 67 y.o.   MRN: [id number]  HPI  ROV 09/24/22 --[name] is 23 with severe COPD, chronic systolic heart failure.  She was hospitalized with acute on chronic respiratory failure in mid 2023 with prolonged ICU stay, MV, polymyopathy.  She required tracheostomy but is now decannulated.  She has residual scattered pulmonary infiltrates.  Most recent imaging 02/2022 showed some persistent architectural distortion in the bilateral scar, pulmonary nodules measuring 8 mm posterior right middle lobe, 9 mm left upper lobe, 12.7 mm right lower lobe. Managed on Stiolto - feels that she has been breathing fairly well, has had a a bit more trouble with the hotter weather. Feels SOB w rushing around. Minimal albuterol use. No real cough.   Low-dose CT scan of the chest 08/12/2022 reviewed by me shows severe centrilobular emphysema, bilateral upper lobe linear opacities that are somewhat smaller.  Pulmonary nodules are decreased in size or are absent.  RADS 3, 25-month follow-up recommended    Review of Systems As per HPI     Objective:   Physical Exam  Vitals:   09/24/22 1551  BP: 112/66  Pulse: 61  Temp: 97.9 F (36.6 C)  TempSrc: Oral  SpO2: 95%  Weight: 111 lb 9.6 oz (50.6 kg)  Height: 5' (1.524 m)    Gen: Pleasant, well-nourished, in no distress,  normal affect  ENT: No lesions,  mouth clear,  oropharynx clear, no postnasal drip  Neck: No JVD, no stridor, stoma healed  Lungs: No use of accessory muscles, good air movement, some scattered right-sided crackles, clear on the left  Cardiovascular: RRR, heart sounds normal, no murmur or gallops, no peripheral edema  Musculoskeletal: Wearing bilateral lower extremity braces  Neuro: alert, awake, non focal  Skin: Warm, no lesions or rash      Assessment & Plan:  COPD, severe (HCC) Actually quite well compensated at this time following fairly devastating hospitalization  in mid 2023.  No longer on supplemental oxygen.  Doing well without daily symptoms.  Minimal albuterol use.  Continue to manage on Stiolto.  If her exertional tolerance changes then would repeat her ambulatory oximetry to ensure that she is not desaturating.   Please continue Stiolto 2 puffs once daily. Keep albuterol available to use 2 puffs up to every 4 hours if needed for shortness of breath, chest tightness, wheezing.  Follow Dr. Delton Coombes in 1 year or sooner if you have any problems.  Lung nodules Following persistent scarring superimposed on nodular disease since her hospitalization for severe pneumonia.  Her most recent lung cancer screening CT showed improvement in both scarring and nodules.  22-month follow-up was recommended, will perform in September 2024  Allergic rhinitis Continue Claritin as you have been taking it Continue azelastine/fluticasone nasal spray as you have been taking it Continue Singulair each evening.  GERD (gastroesophageal reflux disease) Continue pantoprazole once daily   Levy Pupa, MD, PhD 09/24/2022, 4:16 PM [facility] [phone] or if no answer before 7:00PM call [phone] For any issues after 7:00PM please call eLink [phone]

## 2022-10-08 DIAGNOSIS — H524 Presbyopia: Secondary | ICD-10-CM | POA: Diagnosis not present

## 2022-10-15 DIAGNOSIS — J439 Emphysema, unspecified: Secondary | ICD-10-CM | POA: Diagnosis not present

## 2022-11-06 DIAGNOSIS — H524 Presbyopia: Secondary | ICD-10-CM | POA: Diagnosis not present

## 2022-12-08 ENCOUNTER — Other Ambulatory Visit: Payer: Self-pay | Admitting: Acute Care

## 2022-12-08 DIAGNOSIS — R918 Other nonspecific abnormal finding of lung field: Secondary | ICD-10-CM

## 2022-12-25 DIAGNOSIS — M545 Low back pain, unspecified: Secondary | ICD-10-CM | POA: Diagnosis not present

## 2022-12-25 DIAGNOSIS — F419 Anxiety disorder, unspecified: Secondary | ICD-10-CM | POA: Diagnosis not present

## 2022-12-25 DIAGNOSIS — N390 Urinary tract infection, site not specified: Secondary | ICD-10-CM | POA: Diagnosis not present

## 2022-12-31 ENCOUNTER — Other Ambulatory Visit: Payer: Self-pay | Admitting: Gastroenterology

## 2022-12-31 DIAGNOSIS — Z1211 Encounter for screening for malignant neoplasm of colon: Secondary | ICD-10-CM | POA: Diagnosis not present

## 2022-12-31 DIAGNOSIS — K219 Gastro-esophageal reflux disease without esophagitis: Secondary | ICD-10-CM | POA: Diagnosis not present

## 2022-12-31 DIAGNOSIS — R1310 Dysphagia, unspecified: Secondary | ICD-10-CM | POA: Diagnosis not present

## 2022-12-31 DIAGNOSIS — R194 Change in bowel habit: Secondary | ICD-10-CM | POA: Diagnosis not present

## 2023-01-07 DIAGNOSIS — R1310 Dysphagia, unspecified: Secondary | ICD-10-CM | POA: Diagnosis not present

## 2023-01-07 DIAGNOSIS — R194 Change in bowel habit: Secondary | ICD-10-CM | POA: Diagnosis not present

## 2023-01-07 DIAGNOSIS — K219 Gastro-esophageal reflux disease without esophagitis: Secondary | ICD-10-CM | POA: Diagnosis not present

## 2023-01-10 ENCOUNTER — Ambulatory Visit
Admission: RE | Admit: 2023-01-10 | Discharge: 2023-01-10 | Disposition: A | Discharge status: Home / Self Care | Payer: Medicare Other | Source: Ambulatory Visit | Attending: Gastroenterology | Admitting: Gastroenterology

## 2023-01-10 DIAGNOSIS — R1310 Dysphagia, unspecified: Secondary | ICD-10-CM

## 2023-01-10 DIAGNOSIS — K224 Dyskinesia of esophagus: Secondary | ICD-10-CM | POA: Diagnosis not present

## 2023-01-21 DIAGNOSIS — F32A Depression, unspecified: Secondary | ICD-10-CM | POA: Diagnosis not present

## 2023-01-21 DIAGNOSIS — R2681 Unsteadiness on feet: Secondary | ICD-10-CM | POA: Diagnosis not present

## 2023-01-21 DIAGNOSIS — F411 Generalized anxiety disorder: Secondary | ICD-10-CM | POA: Diagnosis not present

## 2023-01-21 DIAGNOSIS — N3944 Nocturnal enuresis: Secondary | ICD-10-CM | POA: Diagnosis not present

## 2023-01-28 DIAGNOSIS — N390 Urinary tract infection, site not specified: Secondary | ICD-10-CM | POA: Diagnosis not present

## 2023-02-11 DIAGNOSIS — N390 Urinary tract infection, site not specified: Secondary | ICD-10-CM | POA: Diagnosis not present

## 2023-02-17 ENCOUNTER — Ambulatory Visit
Admission: RE | Admit: 2023-02-17 | Discharge: 2023-02-17 | Disposition: A | Discharge status: Home / Self Care | Payer: Medicare Other | Source: Ambulatory Visit | Attending: Acute Care | Admitting: Acute Care

## 2023-02-17 DIAGNOSIS — J984 Other disorders of lung: Secondary | ICD-10-CM | POA: Diagnosis not present

## 2023-02-17 DIAGNOSIS — R918 Other nonspecific abnormal finding of lung field: Secondary | ICD-10-CM | POA: Diagnosis not present

## 2023-02-17 DIAGNOSIS — J439 Emphysema, unspecified: Secondary | ICD-10-CM | POA: Diagnosis not present

## 2023-02-17 DIAGNOSIS — I700 Atherosclerosis of aorta: Secondary | ICD-10-CM | POA: Diagnosis not present

## 2023-03-03 ENCOUNTER — Telehealth: Payer: Self-pay | Admitting: *Deleted

## 2023-03-03 NOTE — Telephone Encounter (Signed)
[facility] calling with Call Report. [phone]

## 2023-03-03 NOTE — Telephone Encounter (Signed)
Spoke with Elnita Maxwell at [facility] radiology for call report:  IMPRESSION: 1. Lung-RADS 4B, suspicious. Additional imaging evaluation or consultation with Pulmonology or Thoracic Surgery recommended. 2. Aortic Atherosclerosis (ICD10-I70.0) and Emphysema (ICD10-J43.9). 3. These results will be called to the ordering clinician or representative by the Radiologist Assistant, and communication documented in the PACS or [employer].

## 2023-03-05 ENCOUNTER — Telehealth: Payer: Self-pay | Admitting: Acute Care

## 2023-03-05 DIAGNOSIS — N390 Urinary tract infection, site not specified: Secondary | ICD-10-CM | POA: Diagnosis not present

## 2023-03-05 DIAGNOSIS — B9620 Unspecified Escherichia coli [E. coli] as the cause of diseases classified elsewhere: Secondary | ICD-10-CM | POA: Diagnosis not present

## 2023-03-05 DIAGNOSIS — Z87891 Personal history of nicotine dependence: Secondary | ICD-10-CM

## 2023-03-05 DIAGNOSIS — R911 Solitary pulmonary nodule: Secondary | ICD-10-CM

## 2023-03-05 NOTE — Telephone Encounter (Signed)
Thank you for arranging. I will follow her PET

## 2023-03-05 NOTE — Telephone Encounter (Signed)
Results and plan sent to pt's PCP.

## 2023-03-05 NOTE — Telephone Encounter (Signed)
I have called the patient with the results of her low-dose screening CT.  I explained that there is a progressive nodular density in the left upper lobe that measures 14.4 mm.  On the previous exam 6 months ago this measured 10.2 mm. I explained that our best option at this point is to go ahead and do a PET scan to better evaluate that area.  She is in agreement with this plan. I will place an order for PET scan she prefers location to be [place] [facility]. She will need follow-up with either myself or Dr. Delton Coombes to review the results of the scan and determine next best steps of care. Sherre Lain, and [place], please fax results to PCP I have placed order for PET scan. Dr. Delton Coombes I have added you just so that you are aware, as you see Mr. Vadala for her COPD.If PET is positive, we will schedule her with you for Bronch.

## 2023-03-06 DIAGNOSIS — J449 Chronic obstructive pulmonary disease, unspecified: Secondary | ICD-10-CM | POA: Diagnosis not present

## 2023-03-06 DIAGNOSIS — F411 Generalized anxiety disorder: Secondary | ICD-10-CM | POA: Diagnosis not present

## 2023-03-06 DIAGNOSIS — R911 Solitary pulmonary nodule: Secondary | ICD-10-CM | POA: Diagnosis not present

## 2023-03-06 DIAGNOSIS — F32A Depression, unspecified: Secondary | ICD-10-CM | POA: Diagnosis not present

## 2023-03-13 ENCOUNTER — Ambulatory Visit
Admission: RE | Admit: 2023-03-13 | Discharge: 2023-03-13 | Disposition: A | Discharge status: Home / Self Care | Payer: Medicare Other | Source: Ambulatory Visit | Attending: Acute Care | Admitting: Acute Care

## 2023-03-13 DIAGNOSIS — Z87891 Personal history of nicotine dependence: Secondary | ICD-10-CM | POA: Diagnosis not present

## 2023-03-13 DIAGNOSIS — R911 Solitary pulmonary nodule: Secondary | ICD-10-CM | POA: Diagnosis not present

## 2023-03-13 DIAGNOSIS — R918 Other nonspecific abnormal finding of lung field: Secondary | ICD-10-CM | POA: Diagnosis not present

## 2023-03-13 LAB — GLUCOSE, CAPILLARY: Glucose-Capillary: 89 mg/dL (ref 70–99)

## 2023-03-13 MED ORDER — FLUDEOXYGLUCOSE F - 18 (FDG) INJECTION
6.1400 | Freq: Once | INTRAVENOUS | Status: AC | PRN
  Administered 2023-03-13: 6.14 via INTRAVENOUS

## 2023-03-16 ENCOUNTER — Other Ambulatory Visit: Payer: Self-pay | Admitting: Emergency Medicine

## 2023-03-20 DIAGNOSIS — F331 Major depressive disorder, recurrent, moderate: Secondary | ICD-10-CM | POA: Diagnosis not present

## 2023-03-26 NOTE — Progress Notes (Signed)
Cardiology Office Note   Date:  03/27/2023   ID:  [name], DOB 1955/09/28, MRN [id number]  PCP:  Elie Confer, NP  Cardiologist:   Dietrich Pates, MD   Patient presents for follow of atherosclerosis and SOB  History of Present Illness: [name] is a 67 y.o. female with a history of severe COPD, tobacco abuse, atherosclerosis of aorta on CTscan     Echo in 2015 LVEF and RVEF were normal   I saw the pt initially for SOB in Aug 2022   CT showed scattered calcifications of aorta   Recomm medical Rx    Echo in April 2023  LVEF 40s (see below)   Current Meds  Medication Sig   albuterol (VENTOLIN HFA) 108 (90 Base) MCG/ACT inhaler INHALE 2 PUFFS INTO THE LUNGS EVERY 6 HOURS AS NEEDED FOR WHEEZE/SHORTNESS OF BREATH   ALPRAZolam (XANAX) 1 MG tablet Take 1 tablet by mouth 2 (two) times daily.   Azelastine-Fluticasone 137-50 MCG/ACT SUSP SPRAY 2 SPRAYS TWICE A DAY BY INTRANASAL ROUTE.   escitalopram (LEXAPRO) 10 MG tablet Take 1 tablet every day by oral route for 30 days.   metoprolol tartrate (LOPRESSOR) 25 MG tablet Take 0.5 tablets (12.5 mg total) by mouth 2 (two) times daily. (Patient taking differently: Take 12.5 mg by mouth daily.)   omeprazole (PRILOSEC OTC) 20 MG tablet 1 tablet every day by oral route.   pantoprazole (PROTONIX) 40 MG tablet Take 1 tablet by mouth daily.   rosuvastatin (CRESTOR) 20 MG tablet Take 1 tablet by mouth daily.   Tiotropium Bromide-Olodaterol (STIOLTO RESPIMAT) 2.5-2.5 MCG/ACT AERS INHALE 2 PUFFS BY MOUTH INTO THE LUNGS DAILY   traMADol (ULTRAM) 50 MG tablet Take 1 tablet (50 mg total) by mouth 4 (four) times daily -  before meals and at bedtime.     Allergies:   Patient has no allergy information on record.   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    Past Surgical  History:  Procedure Laterality Date   ABDOMINAL HYSTERECTOMY     BACK SURGERY     ILEO LOOP NEOBLADDER     VESICOVAGINAL FISTULA REPAIR       Social History:  The patient  reports that she quit smoking about 3 years ago. Her smoking use included cigarettes. She started smoking about 48 years ago. She has a 90 pack-year smoking history. She has never used smokeless tobacco. She reports that she does not drink alcohol and does not use drugs.   Family History:  The patient's family history includes Atrial fibrillation in her sister; Cancer in her mother; Heart disease in her father and maternal grandmother.    ROS:  Please see the history of present illness. All other systems are reviewed and  Negative to the above problem except as noted.    PHYSICAL EXAM: VS:  BP 108/62 (BP Location: Left Arm)   Pulse (!) 52   Ht 5\' 5"  (1.651 m)   Wt 114 lb 9.6 oz (52 kg)   SpO2 98%   BMI 19.07 kg/m   GEN: Thin 67 yo in no acute distress  HEENT: normal  Neck: no JVD, carotid bruits Cardiac: RRR; no murmurs,  No LE edema  Respiratory:  Decreased airflow   No rales or wheezes  GI: soft, nontender   No hepatomegaly  MS: no deformity Moving all extremities    EKG:  EKG shows sinus bradycardia    52 bpm     Echo  APril 2023  1. Limited visualization of the myocardium, incomplete assessment of wall  motion. Anteroseptal wall is akinetic. Apex is akinetic. Marland Kitchen Left  ventricular ejection fraction, by estimation, is 40%. The left ventricle  has mildly decreased function. The left  ventricle demonstrates regional wall motion abnormalities (see scoring  diagram/findings for description). Left ventricular diastolic parameters  are consistent with Grade I diastolic dysfunction (impaired relaxation).   2. Right ventricular systolic function is normal. The right ventricular  size is normal. There is normal pulmonary artery systolic pressure.   3. The mitral valve is normal in structure. No evidence of  mitral valve  regurgitation. No evidence of mitral stenosis.   4. The tricuspid valve is abnormal.   5. The aortic valve is tricuspid. Aortic valve regurgitation is not  visualized. No aortic stenosis is present.   6. The inferior vena cava is normal in size with greater than 50%  respiratory variability, suggesting right atrial pressure of 3 mmHg.    Lipid Panel    Component Value Date/Time   CHOL 81 10/16/2021 0327   TRIG 61 10/16/2021 0327   HDL 46 10/16/2021 0327   CHOLHDL 1.8 10/16/2021 0327   VLDL 12 10/16/2021 0327   LDLCALC 23 10/16/2021 0327      Wt Readings from Last 3 Encounters:  03/27/23 114 lb 9.6 oz (52 kg)  09/24/22 111 lb 9.6 oz (50.6 kg)  06/20/22 114 lb 9.6 oz (52 kg)      ASSESSMENT AND PLAN:  1  Dyspnea.   Pt with signficant COPD   Volume status is good   I am not convinced of active angina but LV function is down in 2023  with regional wall changes     Follow for now    Review of CT scan of chest , no significant coronary calcifications. Would repeat echo to reevaluate   2 Pulmonary SHe just had a PET scan that shows hypermetabolic nodule   WIll follow up in pulmonary R [place] notified   2  Atherosclerosis   Scattered calcifications seen on CT of abdominal aorta   Control risk factors  3   HTN  BP is well controlled on current regimen  4  HL   ON Crestor  LDL 23  HDL 46    Current medicines are reviewed at length with the patient today.  The patient does not have concerns regarding medicines.  Signed, Dietrich Pates, MD  03/27/2023 2:05 PM    [facility] [address] [place], [place], [place]  [address] Phone: [phone]; Fax: [phone]

## 2023-03-27 ENCOUNTER — Encounter: Payer: Self-pay | Admitting: Internal Medicine

## 2023-03-27 ENCOUNTER — Telehealth: Payer: Self-pay | Admitting: Emergency Medicine

## 2023-03-27 ENCOUNTER — Ambulatory Visit: Payer: Medicare Other | Attending: Internal Medicine | Admitting: Internal Medicine

## 2023-03-27 ENCOUNTER — Other Ambulatory Visit (HOSPITAL_COMMUNITY)
Admission: RE | Admit: 2023-03-27 | Discharge: 2023-03-27 | Disposition: A | Discharge status: Home / Self Care | Payer: Medicare Other | Source: Ambulatory Visit | Attending: Internal Medicine | Admitting: Internal Medicine

## 2023-03-27 VITALS — BP 108/62 | HR 52 | Ht 65.0 in | Wt 114.6 lb

## 2023-03-27 DIAGNOSIS — I10 Essential (primary) hypertension: Secondary | ICD-10-CM | POA: Diagnosis not present

## 2023-03-27 DIAGNOSIS — E785 Hyperlipidemia, unspecified: Secondary | ICD-10-CM | POA: Insufficient documentation

## 2023-03-27 DIAGNOSIS — R0789 Other chest pain: Secondary | ICD-10-CM

## 2023-03-27 LAB — COMPREHENSIVE METABOLIC PANEL
ALT: 21 U/L (ref 0–44)
AST: 23 U/L (ref 15–41)
Albumin: 4.3 g/dL (ref 3.5–5.0)
Alkaline Phosphatase: 147 U/L — ABNORMAL HIGH (ref 38–126)
Anion gap: 9 (ref 5–15)
BUN: 15 mg/dL (ref 8–23)
CO2: 29 mmol/L (ref 22–32)
Calcium: 9.2 mg/dL (ref 8.9–10.3)
Chloride: 99 mmol/L (ref 98–111)
Creatinine, Ser: 0.77 mg/dL (ref 0.44–1.00)
GFR, Estimated: >60 mL/min (ref >60)
Glucose, Bld: 89 mg/dL (ref 70–99)
Potassium: 3.5 mmol/L (ref 3.5–5.1)
Sodium: 137 mmol/L (ref 135–145)
Total Bilirubin: 0.9 mg/dL (ref 0.3–1.2)
Total Protein: 7.1 g/dL (ref 6.5–8.1)

## 2023-03-27 LAB — CBC
HCT: 39.7 % (ref 36.0–46.0)
Hemoglobin: 12.8 g/dL (ref 12.0–15.0)
MCH: 31.5 pg (ref 26.0–34.0)
MCHC: 32.2 g/dL (ref 30.0–36.0)
MCV: 97.8 fL (ref 80.0–100.0)
Platelets: 164 10*3/uL (ref 150–400)
RBC: 4.06 MIL/uL (ref 3.87–5.11)
RDW: 13.6 % (ref 11.5–15.5)
WBC: 6.6 10*3/uL (ref 4.0–10.5)
nRBC: 0 % (ref 0.0–0.2)

## 2023-03-27 NOTE — Telephone Encounter (Signed)
I reviewed PET scan results with the patient by phone.  Her slightly larger left upper lobe pulmonary nodule does have hypermetabolism on the PET.  There is an adjacent anterior left upper lobe area of platelike scarring that also has milder activity of unclear significance.  I believe she is high risk for bronchoscopy.  She may be a candidate for empiric XRT.  If so she would like to get this done at [facility].  I will arrange to see her in our office, reviewed the films, discussed the options and then talk to radiation oncology at [facility] about possible empiric SBRT.  Please make the patient office visit with RB next available.  Okay to use blocked slot or nodule slot.

## 2023-03-27 NOTE — Patient Instructions (Signed)
Medication Instructions:  Your physician recommends that you continue on your current medications as directed. Please refer to the Current Medication list given to you today.  *If you need a refill on your cardiac medications before your next appointment, please call your pharmacy*   Lab Work: Your physician recommends that you return for lab work in: Today   If you have labs (blood work) drawn today and your tests are completely normal, you will receive your results only by: MyChart Message (if you have MyChart) OR A paper copy in the mail If you have any lab test that is abnormal or we need to change your treatment, we will call you to review the results.   Testing/Procedures: NONE   Follow-Up: At [facility], you and your health needs are our priority.  As part of our continuing mission to provide you with exceptional heart care, we have created designated Provider Care Teams.  These Care Teams include your primary Cardiologist (physician) and Advanced Practice Providers (APPs -  Physician Assistants and Nurse Practitioners) who all work together to provide you with the care you need, when you need it.  We recommend signing up for the patient portal called "MyChart".  Sign up information is provided on this After Visit Summary.  MyChart is used to connect with patients for Virtual Visits (Telemedicine).  Patients are able to view lab/test results, encounter notes, upcoming appointments, etc.  Non-urgent messages can be sent to your provider as well.   To learn more about what you can do with MyChart, go to [email or web address].    Your next appointment:    August 2025   Provider:   You may see Dietrich Pates, MD or one of the following Advanced Practice Providers on your designated Care Team:   Randall An, PA-C  Jacolyn Reedy, PA-C     Other Instructions Thank you for choosing [facility]!

## 2023-03-28 LAB — NMR, LIPOPROFILE
Cholesterol, Total: 158 mg/dL (ref 100–199)
HDL Cholesterol by NMR: 58 mg/dL (ref >39)
HDL Particle Number: 35.2 umol/L (ref >=30.5)
LDL Particle Number: 530 nmol/L (ref <1000)
LDL Size: 21.6 nmol (ref >20.5)
LDL-C (NIH Calc): 80 mg/dL (ref 0–99)
LP-IR Score: <25 (ref <=45)
Small LDL Particle Number: 166 nmol/L (ref <=527)
Triglycerides by NMR: 110 mg/dL (ref 0–149)

## 2023-04-02 ENCOUNTER — Telehealth: Payer: Self-pay | Admitting: *Deleted

## 2023-04-02 DIAGNOSIS — I5023 Acute on chronic systolic (congestive) heart failure: Secondary | ICD-10-CM

## 2023-04-02 NOTE — Telephone Encounter (Signed)
-----   Message from [place] sent at 03/30/2023  9:47 PM EDT ----- Would recomm repeat echo to reevaluate LV function (compare to echo from 2023)   With [facility] Can be done anywhere, where ever convenient.

## 2023-04-02 NOTE — Telephone Encounter (Signed)
Pt.notified

## 2023-04-09 ENCOUNTER — Other Ambulatory Visit: Payer: Self-pay | Admitting: Gastroenterology

## 2023-04-10 ENCOUNTER — Encounter: Payer: Self-pay | Admitting: Family Medicine

## 2023-04-10 ENCOUNTER — Ambulatory Visit: Payer: Medicare Other | Admitting: Family Medicine

## 2023-04-10 VITALS — BP 120/62 | HR 65 | Temp 98.2°F | Ht 65.0 in | Wt 115.0 lb

## 2023-04-10 DIAGNOSIS — G8929 Other chronic pain: Secondary | ICD-10-CM

## 2023-04-10 DIAGNOSIS — K219 Gastro-esophageal reflux disease without esophagitis: Secondary | ICD-10-CM

## 2023-04-10 DIAGNOSIS — F419 Anxiety disorder, unspecified: Secondary | ICD-10-CM

## 2023-04-10 DIAGNOSIS — J449 Chronic obstructive pulmonary disease, unspecified: Secondary | ICD-10-CM

## 2023-04-10 DIAGNOSIS — I5023 Acute on chronic systolic (congestive) heart failure: Secondary | ICD-10-CM

## 2023-04-10 DIAGNOSIS — E785 Hyperlipidemia, unspecified: Secondary | ICD-10-CM

## 2023-04-10 DIAGNOSIS — Z23 Encounter for immunization: Secondary | ICD-10-CM

## 2023-04-10 DIAGNOSIS — J301 Allergic rhinitis due to pollen: Secondary | ICD-10-CM

## 2023-04-10 MED ORDER — ALPRAZOLAM 1 MG PO TABS: 1.0000 mg | ORAL_TABLET | Freq: Two times a day (BID) | ORAL | 0 refills | Status: DC

## 2023-04-10 MED ORDER — ESCITALOPRAM OXALATE 10 MG PO TABS: 10.0000 mg | ORAL_TABLET | Freq: Every day | ORAL | 1 refills | Status: DC

## 2023-04-10 MED ORDER — CLARITIN-D 24 HOUR 10-240 MG PO TB24: 1.0000 | ORAL_TABLET | Freq: Every day | ORAL | 1 refills | Status: AC

## 2023-04-10 MED ORDER — METOPROLOL TARTRATE 25 MG PO TABS: 12.5000 mg | ORAL_TABLET | Freq: Every day | ORAL | 1 refills | Status: DC

## 2023-04-10 MED ORDER — PANTOPRAZOLE SODIUM 40 MG PO TBEC: 40.0000 mg | DELAYED_RELEASE_TABLET | Freq: Every day | ORAL | 1 refills | Status: DC

## 2023-04-10 MED ORDER — AZELASTINE-FLUTICASONE 137-50 MCG/ACT NA SUSP: NASAL | Status: AC

## 2023-04-10 MED ORDER — SPIRONOLACTONE 25 MG PO TABS: 25.0000 mg | ORAL_TABLET | Freq: Every day | ORAL | 1 refills | Status: DC

## 2023-04-10 MED ORDER — ALBUTEROL SULFATE HFA 108 (90 BASE) MCG/ACT IN AERS: INHALATION_SPRAY | RESPIRATORY_TRACT | 11 refills | Status: AC

## 2023-04-10 MED ORDER — FLUTICASONE PROPIONATE 50 MCG/ACT NA SUSP: 1.0000 | Freq: Every day | NASAL | 1 refills | Status: DC | PRN

## 2023-04-10 MED ORDER — TRAMADOL HCL 50 MG PO TABS: 50.0000 mg | ORAL_TABLET | Freq: Three times a day (TID) | ORAL | 0 refills | Status: DC

## 2023-04-10 MED ORDER — STIOLTO RESPIMAT 2.5-2.5 MCG/ACT IN AERS: INHALATION_SPRAY | RESPIRATORY_TRACT | 5 refills | Status: DC

## 2023-04-10 MED ORDER — MONTELUKAST SODIUM 10 MG PO TABS: 10.0000 mg | ORAL_TABLET | Freq: Every day | ORAL | 1 refills | Status: DC

## 2023-04-10 MED ORDER — ROSUVASTATIN CALCIUM 20 MG PO TABS: 20.0000 mg | ORAL_TABLET | Freq: Every day | ORAL | 1 refills | Status: DC

## 2023-04-10 NOTE — Progress Notes (Signed)
Subjective:    Patient ID: [name], female    DOB: 09-Oct-1955, 67 y.o.   MRN: [id number]  Depression         Patient is a 67 year old Caucasian female here today to establish care with me.  She is married to one of my patients who is battling lung cancer.  She herself has severe COPD.  She also was recently diagnosed with possible lung cancer on PET scan.  She has a 12 mm left upper lobe nodule with hypermetabolic activity that recently enlarged.  This is concerning for bronchogenic carcinoma.  She is scheduled to see her pulmonologist on November seventh to discuss treatment options that she suspect that they will likely pursue radiation therapy.  She also has a history of severe interstitial cystitis.  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  Last year she was hospitalized for 3 months with respiratory failure secondary to pneumonia.  She has a tracheostomy scar for this.  She has a history of lumbar fusion from L4-S1.  This causes chronic back pain.  Originally she was on methadone however she has been able to wean herself down to tramadol, 50 mg 3 times a day.  She also has a history of an ejection fraction of 40% on echocardiogram with diastolic dysfunction.  She sees cardiology and is currently on spironolactone.  She also has a history of chronic anxiety for which she takes Lexapro and uses Xanax twice daily.  Her previous PCP has left the practice and she is trying to get established with a new PCP to prescribe her medications. 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     ipratropium (ATROVENT HFA) 17 MCG/ACT inhaler INHALE 2 PUFFS 3 TIMES A DAY BY INHALATION ROUTE AS DIRECTED FOR 7 DAYS.     Multiple Vitamin (MULTIVITAMIN WITH MINERALS) TABS tablet Take 1 tablet by mouth daily.     omeprazole (PRILOSEC OTC) 20 MG tablet 1 tablet every day by oral route.     No current facility-administered medications on file prior to visit.   Not on File Social History   Socioeconomic History   Marital status: Married    Spouse name: Not on file   Number of children: Not on file   Years of education: Not on file   Highest education level: Some college, no degree  Occupational History   Not on file  Tobacco Use   Smoking status: Former    Current packs/day: 0.00    Average packs/day: 2.0 packs/day for 45.0 years (90.0 ttl pk-yrs)    Types: Cigarettes    Start date: 04/10/1974    Quit date: 04/11/2019    Years since quitting: 4.0   Smokeless tobacco: Never  Vaping  Use   Vaping status: Never Used  Substance and Sexual Activity   Alcohol use: No   Drug use: Never   Sexual activity: Yes  Other Topics Concern   Not on file  Social History Narrative   Not on file   Social Determinants of Health   Financial Resource Strain: Low Risk  (04/06/2023)   Overall Financial Resource Strain (CARDIA)    Difficulty of Paying Living Expenses: Not very hard  Food Insecurity: No Food Insecurity (04/06/2023)   Hunger Vital Sign    Worried About Running Out of Food in the Last Year: Never true    Ran Out of Food in the Last Year: Never true  Transportation Needs: No Transportation Needs (04/06/2023)   PRAPARE - Administrator, Civil Service (Medical): No    Lack of Transportation  (Non-Medical): No  Physical Activity: Unknown (04/06/2023)   Exercise Vital Sign    Days of Exercise per Week: 7 days    Minutes of Exercise per Session: Patient declined  Stress: Stress Concern Present (04/06/2023)   [employer] of Occupational Health - Occupational Stress Questionnaire    Feeling of Stress : Very much  Social Connections: Moderately Isolated (04/06/2023)   Social Connection and Isolation Panel [NHANES]    Frequency of Communication with Friends and Family: Twice a week    Frequency of Social Gatherings with Friends and Family: Once a week    Attends Religious Services: Never    Database administrator or Organizations: No    Attends Engineer, structural: Not on file    Marital Status: Married  Catering manager Violence: Not on file   Family History  Problem Relation Age of Onset   Cancer Mother    Heart disease Father    Atrial fibrillation Sister    Heart disease Maternal Grandmother      Review of Systems  Psychiatric/Behavioral:  Positive for depression.   All other systems reviewed and are negative.      Objective:   Physical Exam Vitals reviewed.  Constitutional:      Appearance: Normal appearance. She is normal weight. She is not toxic-appearing or diaphoretic.  HENT:     Right Ear: Tympanic membrane and ear canal normal.     Left Ear: Tympanic membrane and ear canal normal.  Eyes:     Extraocular Movements: Extraocular movements intact.     Pupils: Pupils are equal, round, and reactive to light.  Cardiovascular:     Rate and Rhythm: Normal rate and regular rhythm.     Heart sounds: Normal heart sounds. No murmur heard.    No friction rub. No gallop.  Pulmonary:     Effort: Pulmonary effort is normal. No respiratory distress.     Breath sounds: Normal breath sounds. No wheezing, rhonchi or rales.  Abdominal:     General: Bowel sounds are normal. There is no distension.     Palpations: Abdomen is soft.     Tenderness: There  is no abdominal tenderness. There is no guarding.  Musculoskeletal:     Right lower leg: No edema.     Left lower leg: No edema.  Skin:    Findings: No erythema.  Neurological:     General: No focal deficit present.     Mental Status: She is alert and oriented to person, place, and time.     Cranial Nerves: No cranial nerve deficit.     Sensory: No sensory deficit.  Motor: No weakness.     Coordination: Coordination normal.     Gait: Gait normal.  Psychiatric:        Mood and Affect: Mood normal.        Behavior: Behavior normal.        Thought Content: Thought content normal.           Assessment & Plan:  Needs flu shot - Plan: Flu Vaccine Trivalent High Dose (Fluad)  COPD, severe (HCC) - Plan: albuterol (VENTOLIN HFA) 108 (90 Base) MCG/ACT inhaler, montelukast (SINGULAIR) 10 MG tablet, Tiotropium Bromide-Olodaterol (STIOLTO RESPIMAT) 2.5-2.5 MCG/ACT AERS  Allergic rhinitis due to pollen, unspecified seasonality - Plan: Azelastine-Fluticasone 137-50 MCG/ACT SUSP, loratadine-pseudoephedrine (CLARITIN-D 24 HOUR) 10-240 MG 24 hr tablet, montelukast (SINGULAIR) 10 MG tablet, fluticasone (FLONASE) 50 MCG/ACT nasal spray  Hyperlipidemia, unspecified hyperlipidemia type - Plan: rosuvastatin (CRESTOR) 20 MG tablet  Other chronic pain - Plan: escitalopram (LEXAPRO) 10 MG tablet  Anxiety - Plan: escitalopram (LEXAPRO) 10 MG tablet  Gastroesophageal reflux disease without esophagitis - Plan: pantoprazole (PROTONIX) 40 MG tablet  Acute on chronic systolic CHF (congestive heart failure) (HCC) - Plan: spironolactone (ALDACTONE) 25 MG tablet  I agreed to assume the patient's care.  Regarding her anxiety we will continue Lexapro.  I agreed to prescribe Xanax 1 mg tablets twice daily/60/month.  With regards to her chronic back pain I agreed to prescribe tramadol 50 mg tablets 3 times a day/90/month.  Patient is currently on Stiolto.  In the future I would like to transition the patient  to triple therapy with Breztri.  Her blood pressure today is well-controlled.  I reviewed her CBC, CMP, fasting lipid panel from earlier this month obtain with her cardiologist.  Lab work is excellent.  The patient received her flu shot today.  We discussed the possible diagnosis of the left upper lobe hypermetabolic nodule seen on her recent PET scan.  I wished her luck with her upcoming workup/test.  She is obviously concerned

## 2023-04-17 ENCOUNTER — Encounter: Payer: Self-pay | Admitting: Emergency Medicine

## 2023-04-17 ENCOUNTER — Ambulatory Visit: Payer: Medicare Other | Admitting: Emergency Medicine

## 2023-04-17 VITALS — BP 110/60 | HR 65 | Temp 98.4°F | Ht 61.0 in | Wt 116.4 lb

## 2023-04-17 DIAGNOSIS — R918 Other nonspecific abnormal finding of lung field: Secondary | ICD-10-CM | POA: Diagnosis not present

## 2023-04-17 DIAGNOSIS — J449 Chronic obstructive pulmonary disease, unspecified: Secondary | ICD-10-CM

## 2023-04-17 NOTE — Patient Instructions (Addendum)
We reviewed your CT scan of the chest and your PET scan today. We will refer you to discuss your imaging with Radiation Oncology as I believe there may be a role for empiric radiation treatment to suspicious nodules in the lung. We will defer scheduling bronchoscopy for now.  We may revisit depending on further evaluation Please continue your inhaled medications as you have been taking them Follow with APP or Dr. Delton Coombes in about 1 month

## 2023-04-17 NOTE — Assessment & Plan Note (Signed)
3 suspicious nodules, 2 that are associated with scar in the left upper lobe and are surrounded by emphysematous change.  There is a third nodule in the right middle lobe, small but may have some hypermetabolic activity.  She is a high risk bronchoscopy, high risk for general anesthesia given her emphysema, prior tracheostomy and prolonged mechanical ventilation.  I talked to her today about the option of possible empiric SBRT.  Will review with radiation oncology today.  They may be willing to offer radiation to 1 or more of the suspicious nodules.  Certainly we would follow with serial imaging.  If the right middle lobe nodule is not going to receive empiric SBRT, it may be a better candidate for bronchoscopy although it would not change her risks that would be associated with general anesthesia.

## 2023-04-17 NOTE — Assessment & Plan Note (Signed)
Continue same bronchodilator regimen

## 2023-04-17 NOTE — Progress Notes (Signed)
Subjective:    Patient ID: [name], female    DOB: 1955-10-25, 67 y.o.   MRN: [id number]  HPI  ROV 09/24/22 --[name] is 33 with severe COPD, chronic systolic heart failure.  She was hospitalized with acute on chronic respiratory failure in mid 2023 with prolonged ICU stay, MV, polymyopathy.  She required tracheostomy but is now decannulated.  She has residual scattered pulmonary infiltrates.  Most recent imaging 02/2022 showed some persistent architectural distortion in the bilateral scar, pulmonary nodules measuring 8 mm posterior right middle lobe, 9 mm left upper lobe, 12.7 mm right lower lobe. Managed on Stiolto - feels that she has been breathing fairly well, has had a a bit more trouble with the hotter weather. Feels SOB w rushing around. Minimal albuterol use. No real cough.   Low-dose CT scan of the chest 08/12/2022 reviewed by me shows severe centrilobular emphysema, bilateral upper lobe linear opacities that are somewhat smaller.  Pulmonary nodules are decreased in size or are absent.  RADS 3, 67-month follow-up recommended   ROV 04/17/2023 --67 year old woman with severe COPD and chronic hypoxemic respiratory failure.  Also with chronic systolic CHF.  She had tracheostomy due to a chronic prolonged ICU stay from respiratory failure in mid 2023, now decannulated.  Subsequent imaging showed severe centrilobular emphysema, bilateral upper lobe linear opacities likely scar.  Improvement in her pulmonary nodules.  A 2-month follow-up was recommended as below, then PET scan  CT chest 02/17/2023 reviewed by me shows persistent centrilobular emphysema, no mediastinal or hilar adenopathy, bilateral upper lobe scarring and architectural distortion.  The previously noted left upper lobe nodular density has progressed with some surrounding scar and architectural distortion now 14.4 mm from 10.2 mm.  It was 9.6 mm 02/18/2022  PET scan 03/13/2023 reviewed by me, shows the underlying 12 x 6 mm nodular  component of the anterior left upper lobe opacity is hypermetabolic (SUV 6.9).  The additional platelike scarring in the anterior left upper lobe may have some mild hypermetabolism of unclear significance, but suspicious for malignancy.  There is an 5 mm posterior right middle lobe nodule w hypermetabolism again of unclear significance.  No evidence for distant disease    Review of Systems As per HPI     Objective:   Physical Exam  Vitals:   04/17/23 1611  BP: 110/60  Pulse: 65  Temp: 98.4 F (36.9 C)  TempSrc: Oral  SpO2: 96%  Weight: 116 lb 6.4 oz (52.8 kg)  Height: 5\' 1"  (1.549 m)    Gen: Pleasant, well-nourished, in no distress,  normal affect  ENT: No lesions,  mouth clear,  oropharynx clear, no postnasal drip  Neck: No JVD, no stridor, stoma healed  Lungs: No use of accessory muscles, good air movement, some scattered right-sided crackles, clear on the left  Cardiovascular: RRR, heart sounds normal, no murmur or gallops, no peripheral edema  Musculoskeletal: Wearing bilateral lower extremity braces  Neuro: alert, awake, non focal  Skin: Warm, no lesions or rash      Assessment & Plan:  Lung nodules 3 suspicious nodules, 2 that are associated with scar in the left upper lobe and are surrounded by emphysematous change.  There is a third nodule in the right middle lobe, small but may have some hypermetabolic activity.  She is a high risk bronchoscopy, high risk for general anesthesia given her emphysema, prior tracheostomy and prolonged mechanical ventilation.  I talked to her today about the option of possible empiric SBRT.  Will review with radiation oncology today.  They may be willing to offer radiation to 1 or more of the suspicious nodules.  Certainly we would follow with serial imaging.  If the right middle lobe nodule is not going to receive empiric SBRT, it may be a better candidate for bronchoscopy although it would not change her risks that would be associated  with general anesthesia.  COPD, severe (HCC) Continue same bronchodilator regimen  Time spent 50 minutes  Levy Pupa, MD, PhD 04/17/2023, 5:02 PM [facility] [phone] or if no answer before 7:00PM call [phone] For any issues after 7:00PM please call eLink [phone]

## 2023-04-21 NOTE — Progress Notes (Signed)
Thoracic Location of Tumor / Histology: Nodules Left Upper lobe, Right middle Lobe  03/13/2023 Kandice Robinsons, NP NM PET Image Initial (PI) Skull Base To Thigh CLINICAL DATA: Initial treatment strategy for pulmonary nodule.   IMPRESSION: 12 mm nodular opacity in the anterior left upper lobe, suspicious for primary bronchogenic carcinoma.  Additional 5 mm posterior right middle lobe nodule along the major fissure, indeterminate but warranting attention on follow-up.  Focal hypermetabolism along suspected scarring in the anterior left upper lobe, nonspecific. Overall appearance favors infection/inflammation.  Otherwise, no findings suspicious for metastatic disease.  02/17/2023 Kandice Robinsons, NP CT Chest LCS Nodule F/U Low Dose without Contrast CLINICAL DATA:  Lung cancer screening. Former smoker with 90 pack-year history. Follow-up lung nodule.  IMPRESSION: 1. Lung-RADS 4B, suspicious. Additional imaging evaluation or consultation with Pulmonology or Thoracic Surgery recommended. 2. Aortic Atherosclerosis (ICD10-I70.0) and Emphysema (ICD10-J43.9). 3. These results will be called to the ordering clinician or representative by the Radiologist Assistant, and communication documented in the PACS or [employer].  Past/Anticipated interventions by cardiothoracic surgery, if any: {:18581}  Past/Anticipated interventions by medical oncology, if any: {:18581}   Tobacco/Marijuana/Snuff/ETOH use: {:18581}  Signs/Symptoms Weight changes, if any: {:18581} Respiratory complaints, if any: {:18581} Hemoptysis, if any: {:18581} Pain issues, if any:  {:18581}  SAFETY ISSUES: Prior radiation? {:18581} Pacemaker/ICD? {:18581}  Possible current pregnancy?{:18581} Is the patient on methotrexate? {:18581}  Current Complaints / other details:  ***

## 2023-04-22 NOTE — Progress Notes (Signed)
[facility]         [phone]) [phone] ________________________________  Initial {Inpatient / Outpatient:20114} Consultation  Name: [name] MRN: [id number]  Date: 04/23/2023  DOB: [birth year]  REFERRING PHYSICIAN: Leslye Peer, MD  DIAGNOSIS: {There were no encounter diagnoses. (Refresh or delete this SmartLink)}  No diagnosis found.  HISTORY OF PRESENT ILLNESS: [name] is a 67 y.o. female who is   ***.  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  :   Past Surgical History:  Procedure Laterality Date   ABDOMINAL HYSTERECTOMY     BACK SURGERY     ILEO LOOP NEOBLADDER     VESICOVAGINAL FISTULA REPAIR    :   Current Outpatient Medications:    albuterol (VENTOLIN HFA) 108 (90 Base) MCG/ACT inhaler, INHALE 2 PUFFS INTO THE LUNGS EVERY 6 HOURS AS NEEDED FOR WHEEZE/SHORTNESS OF BREATH, Disp: 36 each, Rfl: 11   ALPRAZolam (XANAX) 1 MG tablet, Take 1 tablet (1 mg total) by mouth 2 (two) times daily., Disp: 60 tablet, Rfl: 0   Azelastine-Fluticasone 137-50 MCG/ACT SUSP, SPRAY 2 SPRAYS TWICE A DAY BY INTRANASAL ROUTE., Disp: 23 g, Rfl: G   escitalopram (LEXAPRO) 10 MG tablet, Take 1 tablet (10 mg total) by mouth daily., Disp: 90 tablet, Rfl: 1   fluconazole (DIFLUCAN) 150 MG tablet, Take 1 tablet every day by oral route for 1 day., Disp: , Rfl:    fluticasone (FLONASE) 50 MCG/ACT nasal spray, Place 1 spray into both nostrils daily as needed for allergies., Disp: 9.9 mL, Rfl: 1   ipratropium (ATROVENT HFA) 17 MCG/ACT inhaler, INHALE 2 PUFFS 3 TIMES A DAY BY INHALATION ROUTE AS DIRECTED FOR 7 DAYS., Disp: , Rfl:    loratadine-pseudoephedrine (CLARITIN-D 24 HOUR) 10-240 MG 24 hr tablet, Take 1 tablet by mouth daily., Disp: 90 tablet, Rfl:  1   metoprolol tartrate (LOPRESSOR) 25 MG tablet, Take 0.5 tablets (12.5 mg total) by mouth daily., Disp: 90 tablet, Rfl: 1   montelukast (SINGULAIR) 10 MG tablet, Take 1 tablet (10 mg total) by mouth daily., Disp: 90 tablet, Rfl: 1   Multiple Vitamin (MULTIVITAMIN WITH MINERALS) TABS tablet, Take 1 tablet by mouth daily., Disp: , Rfl:    omeprazole (PRILOSEC OTC) 20 MG tablet, 1 tablet every day by oral route., Disp: , Rfl:    pantoprazole (PROTONIX) 40 MG tablet, Take 1 tablet (40 mg total) by mouth daily., Disp: 90 tablet, Rfl: 1   rosuvastatin (CRESTOR) 20 MG tablet, Take 1 tablet (20 mg total) by mouth daily., Disp: 90 tablet, Rfl: 1   spironolactone (ALDACTONE) 25 MG tablet, Take 1 tablet (25 mg total) by mouth daily., Disp: 90 tablet, Rfl: 1   Tiotropium Bromide-Olodaterol (STIOLTO RESPIMAT) 2.5-2.5 MCG/ACT AERS, INHALE 2 PUFFS BY MOUTH INTO THE LUNGS DAILY., Disp: 4 g, Rfl: 5   traMADol (ULTRAM) 50 MG tablet, Take 1 tablet (50 mg total) by mouth 4 (four) times daily -  before meals and at bedtime., Disp: 90 tablet, Rfl: 0:  No Known Allergies:   Family History  Problem Relation Age of Onset   Cancer Mother    Heart disease Father    Atrial fibrillation Sister    Heart disease Maternal Grandmother   :   Social  History   Socioeconomic History   Marital status: Married    Spouse name: Not on file   Number of children: Not on file   Years of education: Not on file   Highest education level: Some college, no degree  Occupational History   Not on file  Tobacco Use   Smoking status: Former    Current packs/day: 0.00    Average packs/day: 2.0 packs/day for 45.0 years (90.0 ttl pk-yrs)    Types: Cigarettes    Start date: 04/10/1974    Quit date: 04/11/2019    Years since quitting: 4.0   Smokeless tobacco: Never  Vaping Use   Vaping status: Never Used  Substance and Sexual Activity   Alcohol use: No   Drug use: Never   Sexual activity: Yes  Other Topics Concern   Not on  file  Social History Narrative   Not on file   Social Determinants of Health   Financial Resource Strain: Low Risk  (04/06/2023)   Overall Financial Resource Strain (CARDIA)    Difficulty of Paying Living Expenses: Not very hard  Food Insecurity: No Food Insecurity (04/06/2023)   Hunger Vital Sign    Worried About Running Out of Food in the Last Year: Never true    Ran Out of Food in the Last Year: Never true  Transportation Needs: No Transportation Needs (04/06/2023)   PRAPARE - Administrator, Civil Service (Medical): No    Lack of Transportation (Non-Medical): No  Physical Activity: Unknown (04/06/2023)   Exercise Vital Sign    Days of Exercise per Week: 7 days    Minutes of Exercise per Session: Patient declined  Stress: Stress Concern Present (04/06/2023)   [employer] of Occupational Health - Occupational Stress Questionnaire    Feeling of Stress : Very much  Social Connections: Moderately Isolated (04/06/2023)   Social Connection and Isolation Panel [NHANES]    Frequency of Communication with Friends and Family: Twice a week    Frequency of Social Gatherings with Friends and Family: Once a week    Attends Religious Services: Never    Database administrator or Organizations: No    Attends Engineer, structural: Not on file    Marital Status: Married  Catering manager Violence: Not on file  :  REVIEW OF SYSTEMS:  A 15 point review of systems is documented in the electronic medical record. This was obtained by the nursing staff. However, I reviewed this with the patient to discuss relevant findings and make appropriate changes.  {Ros - complete:30496}   PHYSICAL EXAM:  There were no vitals taken for this visit. {physical exam:3041130}  KPS = ***  100 - Normal; no complaints; no evidence of disease. 90   - Able to carry on normal activity; minor signs or symptoms of disease. 80   - Normal activity with effort; some signs or symptoms of  disease. 27   - Cares for self; unable to carry on normal activity or to do active work. 60   - Requires occasional assistance, but is able to care for most of his personal needs. 50   - Requires considerable assistance and frequent medical care. 40   - Disabled; requires special care and assistance. 30   - Severely disabled; hospital admission is indicated although death not imminent. 20   - Very sick; hospital admission necessary; active supportive treatment necessary. 10   - Moribund; fatal processes progressing rapidly. 0     - Dead  Karnofsky DA, Abelmann WH, Craver LS and Burchenal [facility] [phone]) The use of the nitrogen mustards in the palliative treatment of carcinoma: with particular reference to bronchogenic carcinoma Cancer 1 634-56  LABORATORY DATA:  Lab Results  Component Value Date   WBC 6.6 03/27/2023   HGB 12.8 03/27/2023   HCT 39.7 03/27/2023   MCV 97.8 03/27/2023   PLT 164 03/27/2023   Lab Results  Component Value Date   NA 137 03/27/2023   K 3.5 03/27/2023   CL 99 03/27/2023   CO2 29 03/27/2023   Lab Results  Component Value Date   ALT 21 03/27/2023   AST 23 03/27/2023   ALKPHOS 147 (H) 03/27/2023   BILITOT 0.9 03/27/2023     RADIOGRAPHY: No results found.    IMPRESSION: ***  PLAN:***   I spent {CHL ONC TIME VISIT - YQMVH:[id number]} minutes face to face with the patient and more than 50% of that time was spent in counseling and/or coordination of care.   ------------------------------------------------   Joyice Faster, PA-C    Margaretmary Dys, MD  [facility]  Radiation Oncology Direct Dial: [phone]  Fax: [phone] [email or web address]  Skype  LinkedIn

## 2023-04-23 ENCOUNTER — Ambulatory Visit
Admission: RE | Admit: 2023-04-23 | Discharge: 2023-04-23 | Disposition: A | Discharge status: Home / Self Care | Payer: Medicare Other | Source: Ambulatory Visit | Attending: Radiation Oncology | Admitting: Radiation Oncology

## 2023-04-23 ENCOUNTER — Other Ambulatory Visit: Payer: Self-pay

## 2023-04-23 ENCOUNTER — Encounter: Payer: Self-pay | Admitting: Radiation Oncology

## 2023-04-23 VITALS — Ht 60.0 in | Wt 115.0 lb

## 2023-04-23 DIAGNOSIS — R918 Other nonspecific abnormal finding of lung field: Secondary | ICD-10-CM

## 2023-04-23 DIAGNOSIS — C342 Malignant neoplasm of middle lobe, bronchus or lung: Secondary | ICD-10-CM | POA: Diagnosis not present

## 2023-04-23 DIAGNOSIS — C3412 Malignant neoplasm of upper lobe, left bronchus or lung: Secondary | ICD-10-CM

## 2023-04-25 ENCOUNTER — Ambulatory Visit (HOSPITAL_COMMUNITY)
Admission: RE | Admit: 2023-04-25 | Discharge: 2023-04-25 | Disposition: A | Discharge status: Home / Self Care | Payer: Medicare Other | Source: Ambulatory Visit | Attending: Internal Medicine | Admitting: Internal Medicine

## 2023-04-25 DIAGNOSIS — I5023 Acute on chronic systolic (congestive) heart failure: Secondary | ICD-10-CM | POA: Diagnosis not present

## 2023-04-25 LAB — ECHOCARDIOGRAM COMPLETE
AR max vel: 2.93 cm2
AV Area VTI: 2.59 cm2
AV Area mean vel: 2.92 cm2
AV Mean grad: 2 mm[Hg]
AV Peak grad: 4.4 mm[Hg]
Ao pk vel: 1.05 m/s
Area-P 1/2: 2.82 cm2
Calc EF: 67.9 %
MV VTI: 1.82 cm2
S' Lateral: 3 cm
Single Plane A2C EF: 70.3 %
Single Plane A4C EF: 68.3 %

## 2023-04-29 DIAGNOSIS — Z9889 Other specified postprocedural states: Secondary | ICD-10-CM | POA: Insufficient documentation

## 2023-04-29 DIAGNOSIS — N3010 Interstitial cystitis (chronic) without hematuria: Secondary | ICD-10-CM | POA: Diagnosis not present

## 2023-04-30 DIAGNOSIS — R6889 Other general symptoms and signs: Secondary | ICD-10-CM | POA: Insufficient documentation

## 2023-04-30 DIAGNOSIS — C3412 Malignant neoplasm of upper lobe, left bronchus or lung: Secondary | ICD-10-CM | POA: Insufficient documentation

## 2023-04-30 NOTE — Progress Notes (Signed)
  Radiation Oncology         [phone] ________________________________  Name: [name] MRN: [id number]  Date: 05/01/2023  DOB: [birth year]  STEREOTACTIC BODY RADIOTHERAPY SIMULATION AND TREATMENT PLANNING NOTE    ICD-10-CM   1. Suspected malignant neoplasm of lung  R68.89       DIAGNOSIS:  67 yo woman with putative stage IA2 (cT1b, cN0, cM0) non-small cell lung cancer of the LUL   NARRATIVE:  The patient was brought to the CT Simulation planning suite.  Identity was confirmed.  All relevant records and images related to the planned course of therapy were reviewed.  The patient freely provided informed written consent to proceed with treatment after reviewing the details related to the planned course of therapy. The consent form was witnessed and verified by the simulation staff.  Then, the patient was set-up in a stable reproducible  supine position for radiation therapy.  A BodyFix immobilization pillow was fabricated for reproducible positioning.  Then I personally applied the abdominal compression paddle to limit respiratory excursion.  4D respiratoy motion management CT images were obtained.  Surface markings were placed.  The CT images were loaded into the planning software.  Then, using Cine, MIP, and standard views, the internal target volume (ITV) and planning target volumes (PTV) were delinieated, and avoidance structures were contoured.  Treatment planning then occurred.  The radiation prescription was entered and confirmed.  A total of two complex treatment devices were fabricated in the form of the BodyFix immobilization pillow and a neck accuform cushion.  I have requested : 3D Simulation  I have requested a DVH of the following structures: Heart, Lungs, Esophagus, Chest Wall, Brachial Plexus, Major Blood Vessels, and targets.  SPECIAL TREATMENT PROCEDURE:  The planned course of therapy using radiation constitutes a special treatment procedure. Special care is required in the  management of this patient for the following reasons. This treatment constitutes a Special Treatment Procedure for the following reason: [ High dose per fraction requiring special monitoring for increased toxicities of treatment including daily imaging..  The special nature of the planned course of radiotherapy will require increased physician supervision and oversight to ensure patient's safety with optimal treatment outcomes.  This requires extended time and effort.    RESPIRATORY MOTION MANAGEMENT SIMULATION:  In order to account for effect of respiratory motion on target structures and other organs in the planning and delivery of radiotherapy, this patient underwent respiratory motion management simulation.  To accomplish this, when the patient was brought to the CT simulation planning suite, 4D respiratoy motion management CT images were obtained.  The CT images were loaded into the planning software.  Then, using a variety of tools including Cine, MIP, and standard views, the target volume and planning target volumes (PTV) were delineated.  Avoidance structures were contoured.  Treatment planning then occurred.  Dose volume histograms were generated and reviewed for each of the requested structure.  The resulting plan was carefully reviewed and approved today.  PLAN:  The patient will receive 54 Gy in 3*** fractions.  ________________________________  Artist Pais Kathrynn Running, M.D.

## 2023-05-01 ENCOUNTER — Ambulatory Visit
Admission: RE | Admit: 2023-05-01 | Discharge: 2023-05-01 | Disposition: A | Discharge status: Home / Self Care | Payer: Medicare Other | Source: Ambulatory Visit | Attending: Radiation Oncology | Admitting: Radiation Oncology

## 2023-05-01 DIAGNOSIS — Z510 Encounter for antineoplastic radiation therapy: Secondary | ICD-10-CM | POA: Insufficient documentation

## 2023-05-01 DIAGNOSIS — C3412 Malignant neoplasm of upper lobe, left bronchus or lung: Secondary | ICD-10-CM | POA: Diagnosis not present

## 2023-05-01 DIAGNOSIS — C342 Malignant neoplasm of middle lobe, bronchus or lung: Secondary | ICD-10-CM | POA: Diagnosis not present

## 2023-05-01 DIAGNOSIS — R6889 Other general symptoms and signs: Secondary | ICD-10-CM

## 2023-05-06 ENCOUNTER — Other Ambulatory Visit: Payer: Self-pay

## 2023-05-06 ENCOUNTER — Ambulatory Visit: Payer: Medicare Other

## 2023-05-06 VITALS — Ht 60.0 in | Wt 115.0 lb

## 2023-05-06 DIAGNOSIS — K625 Hemorrhage of anus and rectum: Secondary | ICD-10-CM | POA: Insufficient documentation

## 2023-05-06 DIAGNOSIS — C3412 Malignant neoplasm of upper lobe, left bronchus or lung: Secondary | ICD-10-CM | POA: Diagnosis not present

## 2023-05-06 DIAGNOSIS — R1310 Dysphagia, unspecified: Secondary | ICD-10-CM | POA: Insufficient documentation

## 2023-05-06 DIAGNOSIS — R194 Change in bowel habit: Secondary | ICD-10-CM | POA: Insufficient documentation

## 2023-05-06 DIAGNOSIS — Z0000 Encounter for general adult medical examination without abnormal findings: Secondary | ICD-10-CM

## 2023-05-06 DIAGNOSIS — Z1211 Encounter for screening for malignant neoplasm of colon: Secondary | ICD-10-CM | POA: Insufficient documentation

## 2023-05-06 DIAGNOSIS — F419 Anxiety disorder, unspecified: Secondary | ICD-10-CM

## 2023-05-06 DIAGNOSIS — Z510 Encounter for antineoplastic radiation therapy: Secondary | ICD-10-CM | POA: Diagnosis not present

## 2023-05-06 DIAGNOSIS — G8929 Other chronic pain: Secondary | ICD-10-CM

## 2023-05-06 DIAGNOSIS — R7989 Other specified abnormal findings of blood chemistry: Secondary | ICD-10-CM | POA: Insufficient documentation

## 2023-05-06 DIAGNOSIS — C342 Malignant neoplasm of middle lobe, bronchus or lung: Secondary | ICD-10-CM | POA: Diagnosis not present

## 2023-05-06 MED ORDER — ESCITALOPRAM OXALATE 10 MG PO TABS: 10.0000 mg | ORAL_TABLET | Freq: Every day | ORAL | 1 refills | Status: AC

## 2023-05-06 NOTE — Patient Instructions (Signed)
  Ms. Deininger , Thank you for taking time to come for your Medicare Wellness Visit. I appreciate your ongoing commitment to your health goals. Please review the following plan we discussed and let me know if I can assist you in the future.   These are the goals we discussed:  Goals      Exercise 3x per week (30 min per time)        This is a list of the screening recommended for you and due dates:  Health Maintenance  Topic Date Due   COVID-19 Vaccine (6 - 2023-24 season) 06/14/2023*   Pap with HPV screening  08/04/2023*   Zoster (Shingles) Vaccine (1 of 2) 08/06/2023*   DTaP/Tdap/Td vaccine (1 - Tdap) 05/05/2024*   Pneumonia Vaccine (2 of 2 - PCV) 05/05/2024*   Mammogram  05/05/2024*   DEXA scan (bone density measurement)  05/05/2024*   Colon Cancer Screening  05/05/2024*   Hepatitis C Screening  05/05/2024*   Screening for Lung Cancer  02/17/2024   Medicare Annual Wellness Visit  05/05/2024   Flu Shot  Completed   HIV Screening  Completed   HPV Vaccine  Aged Out  *Topic was postponed. The date shown is not the original due date.

## 2023-05-06 NOTE — Progress Notes (Signed)
Subjective:   [name] is a 67 y.o. female who presents for Medicare Annual (Subsequent) preventive examination.  Visit Complete: Virtual I connected with  [name] on 05/06/23 by a audio enabled telemedicine application and verified that I am speaking with the correct person using two identifiers.  Patient Location: Home  Provider Location: Office/Clinic  I discussed the limitations of evaluation and management by telemedicine. The patient expressed understanding and agreed to proceed.  Vital Signs: Because this visit was a virtual/telehealth visit, some criteria may be missing or patient reported. Any vitals not documented were not able to be obtained and vitals that have been documented are patient reported.  Patient Medicare AWV questionnaire was completed by the patient on 05/06/2023; I have confirmed that all information answered by patient is correct and no changes since this date.        Objective:    Today's Vitals   05/06/23 1505  Weight: 115 lb (52.2 kg)  Height: 5' (1.524 m)  PainSc: 3    Body mass index is 22.46 kg/m.     04/23/2023   12:31 PM 11/22/2021    9:04 AM 09/25/2021    8:18 AM 09/24/2021    4:48 PM 09/13/2021    1:14 PM 05/14/2019    4:48 PM 04/28/2019    2:29 AM  Advanced Directives  Does Patient Have a Medical Advance Directive? Yes No No No No No No  Type of Estate agent of [place];Living will        Would patient like information on creating a medical advance directive?  No - Patient declined No - Patient declined No - Patient declined No - Patient declined No - Patient declined No - Patient declined    Current Medications (verified) Outpatient Encounter Medications as of 05/06/2023  Medication Sig   albuterol (VENTOLIN HFA) 108 (90 Base) MCG/ACT inhaler INHALE 2 PUFFS INTO THE LUNGS EVERY 6 HOURS AS NEEDED FOR WHEEZE/SHORTNESS OF BREATH   ALPRAZolam (XANAX) 1 MG tablet Take 1 tablet (1 mg total) by mouth 2  (two) times daily.   Azelastine-Fluticasone 137-50 MCG/ACT SUSP SPRAY 2 SPRAYS TWICE A DAY BY INTRANASAL ROUTE.   escitalopram (LEXAPRO) 10 MG tablet Take 1 tablet (10 mg total) by mouth daily.   fluticasone (FLONASE) 50 MCG/ACT nasal spray Place 1 spray into both nostrils daily as needed for allergies.   loratadine-pseudoephedrine (CLARITIN-D 24 HOUR) 10-240 MG 24 hr tablet Take 1 tablet by mouth daily.   metoprolol tartrate (LOPRESSOR) 25 MG tablet Take 0.5 tablets (12.5 mg total) by mouth daily.   montelukast (SINGULAIR) 10 MG tablet Take 1 tablet (10 mg total) by mouth daily.   Multiple Vitamin (MULTIVITAMIN WITH MINERALS) TABS tablet Take 1 tablet by mouth daily.   pantoprazole (PROTONIX) 40 MG tablet Take 1 tablet (40 mg total) by mouth daily.   rosuvastatin (CRESTOR) 20 MG tablet Take 1 tablet (20 mg total) by mouth daily.   spironolactone (ALDACTONE) 25 MG tablet Take 1 tablet (25 mg total) by mouth daily.   Tiotropium Bromide-Olodaterol (STIOLTO RESPIMAT) 2.5-2.5 MCG/ACT AERS INHALE 2 PUFFS BY MOUTH INTO THE LUNGS DAILY.   traMADol (ULTRAM) 50 MG tablet Take 1 tablet (50 mg total) by mouth 4 (four) times daily -  before meals and at bedtime.   No facility-administered encounter medications on file as of 05/06/2023.    Allergies (verified) Patient has no known allergies.   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   Past Surgical History:  Procedure Laterality Date   ABDOMINAL HYSTERECTOMY     BACK SURGERY     ILEO LOOP NEOBLADDER     VESICOVAGINAL FISTULA REPAIR     Family History  Problem Relation Age of Onset   Cancer Mother    Heart disease Father    Atrial fibrillation Sister    Heart disease Maternal Grandmother    Social History   Socioeconomic History   Marital status: Married    Spouse  name: Not on file   Number of children: Not on file   Years of education: Not on file   Highest education level: Some college, no degree  Occupational History   Not on file  Tobacco Use   Smoking status: Former    Current packs/day: 0.00    Average packs/day: 2.0 packs/day for 45.0 years (90.0 ttl pk-yrs)    Types: Cigarettes    Start date: 04/10/1974    Quit date: 04/11/2019    Years since quitting: 4.0   Smokeless tobacco: Never  Vaping Use   Vaping status: Never Used  Substance and Sexual Activity   Alcohol use: No   Drug use: Never   Sexual activity: Yes  Other Topics Concern   Not on file  Social History Narrative   Not on file   Social Determinants of Health   Financial Resource Strain: Low Risk  (04/06/2023)   Overall Financial Resource Strain (CARDIA)    Difficulty of Paying Living Expenses: Not very hard  Food Insecurity: No Food Insecurity (04/23/2023)   Hunger Vital Sign    Worried About Running Out of Food in the Last Year: Never true    Ran Out of Food in the Last Year: Never true  Transportation Needs: No Transportation Needs (04/23/2023)   PRAPARE - Administrator, Civil Service (Medical): No    Lack of Transportation (Non-Medical): No  Physical Activity: Unknown (04/06/2023)   Exercise Vital Sign    Days of Exercise per Week: 7 days    Minutes of Exercise per Session: Patient declined  Stress: Stress Concern Present (04/06/2023)   [employer] of Occupational Health - Occupational Stress Questionnaire    Feeling of Stress : Very much  Social Connections: Moderately Isolated (04/06/2023)   Social Connection and Isolation Panel [NHANES]    Frequency of Communication with Friends and Family: Twice a week    Frequency of Social Gatherings with Friends and Family: Once a week    Attends Religious Services: Never    Database administrator or Organizations: No    Attends Engineer, structural: Not on file    Marital Status: Married     Tobacco Counseling Counseling given: Not Answered   Clinical Intake:  Pre-visit preparation completed: Yes  Pain : 0-10 Pain Score: 3  Pain Type: Chronic pain Pain Location: Back Pain Descriptors / Indicators: Aching Pain Onset: More than a month ago Pain Frequency: Intermittent     BMI - recorded: 22.46 Nutritional Status: BMI of 19-24  Normal Nutritional Risks: None Diabetes: No  How often do you need to have someone help you when you read instructions, pamphlets, or other written materials from your doctor or pharmacy?: 1 - Never  Interpreter Needed?: No      Activities of Daily Living  05/06/2023    1:29 PM  In your present state of health, do you have any difficulty performing the following activities:  Hearing? 0  Vision? 0  Difficulty concentrating or making decisions? 0  Walking or climbing stairs? 0  Dressing or bathing? 0  Doing errands, shopping? 0  Preparing Food and eating ? N  Using the Toilet? N  In the past six months, have you accidently leaked urine? Y  Do you have problems with loss of bowel control? N  Managing your Medications? N  Managing your Finances? N  Housekeeping or managing your Housekeeping? N    Patient Care Team: [name], MD as PCP - General (Family Medicine) Pricilla Riffle, MD as PCP - Cardiology (Cardiology)  Indicate any recent Medical Services you may have received from other than [facility] providers in the past year (date may be approximate).     Assessment:   This is a routine wellness examination for [place].  Hearing/Vision screen No results found.   Goals Addressed             This Visit's Progress    Exercise 3x per week (30 min per time)        Depression Screen    04/23/2023   12:45 PM 04/10/2023    2:46 PM 01/04/2022    1:26 PM 09/13/2021    1:48 PM  PHQ 2/9 Scores  PHQ - 2 Score 0 2 2 1   PHQ- 9 Score  11 9 6     Fall Risk    05/06/2023    1:29 PM 04/17/2023    4:11 PM  04/10/2023    2:40 PM 06/20/2022    3:08 PM 01/04/2022    1:26 PM  Fall Risk   Falls in the past year? 0 0 0 1 0  Number falls in past yr: 0  0 0 0  Injury with Fall? 0  0 0 0  Risk for fall due to : No Fall Risks  No Fall Risks    Follow up   Falls prevention discussed      MEDICARE RISK AT HOME: Medicare Risk at Home Any stairs in or around the home?: (P) Yes If so, are there any without handrails?: (P) Yes Home free of loose throw rugs in walkways, pet beds, electrical cords, etc?: (P) Yes Adequate lighting in your home to reduce risk of falls?: (P) Yes Life alert?: (P) No Use of a cane, walker or w/c?: (P) No Grab bars in the bathroom?: (P) No Shower chair or bench in shower?: (P) Yes Elevated toilet seat or a handicapped toilet?: (P) No  TIMED UP AND GO:  Was the test performed?  No    Cognitive Function:        05/06/2023    3:09 PM  6CIT Screen  What Year? 0 points  What month? 0 points  What time? 0 points  Count back from 20 0 points  Months in reverse 0 points  Repeat phrase 0 points  Total Score 0 points    Immunizations Immunization History  Administered Date(s) Administered   Fluad Trivalent(High Dose 65+) 04/10/2023   Influenza,inj,Quad PF,6+ Mos 05/02/2019, 02/20/2022   PFIZER(Purple Top)SARS-COV-2 Vaccination 09/01/2019, 09/22/2019, 02/25/2020   Pneumococcal Polysaccharide-23 08/17/2013, 05/02/2019   Unspecified SARS-COV-2 Vaccination 10/09/2019, 10/29/2019    TDAP status: Due, Education has been provided regarding the importance of this vaccine. Advised may receive this vaccine at local pharmacy or Health Dept. Aware to provide a copy of  the vaccination record if obtained from local pharmacy or Health Dept. Verbalized acceptance and understanding.  Flu Vaccine status: Completed at today's visit  Pneumococcal vaccine status: Completed during today's visit.  Covid-19 vaccine status: Completed vaccines  Qualifies for Shingles Vaccine? Yes    Zostavax completed No   Shingrix Completed?: No.    Education has been provided regarding the importance of this vaccine. Patient has been advised to call insurance company to determine out of pocket expense if they have not yet received this vaccine. Advised may also receive vaccine at local pharmacy or Health Dept. Verbalized acceptance and understanding.  Screening Tests Health Maintenance  Topic Date Due   COVID-19 Vaccine (6 - 2023-24 season) 06/14/2023 (Originally 02/09/2023)   Cervical Cancer Screening (HPV/Pap Cotest)  08/04/2023 (Originally 12/25/1987)   Zoster Vaccines- Shingrix (1 of 2) 08/06/2023 (Originally 12/24/1976)   DTaP/Tdap/Td (1 - Tdap) 05/05/2024 (Originally 12/24/1976)   Pneumonia Vaccine 50+ Years old (2 of 2 - PCV) 05/05/2024 (Originally 12/25/2022)   MAMMOGRAM  05/05/2024 (Originally 12/25/2007)   DEXA SCAN  05/05/2024 (Originally 12/25/2022)   Colonoscopy  05/05/2024 (Originally 12/25/2002)   Hepatitis C Screening  05/05/2024 (Originally 12/25/1975)   Lung Cancer Screening  02/17/2024   Medicare Annual Wellness (AWV)  05/05/2024   INFLUENZA VACCINE  Completed   HIV Screening  Completed   HPV VACCINES  Aged Out    Health Maintenance  There are no preventive care reminders to display for this patient.   Colorectal cancer screening: No longer required.  PT DECLINED   Mammogram status: Completed PET SCAN 03/13/2023. Repeat every year  Bone Density status: Ordered PT DECLINES AT THIS TIME. Pt provided with contact info and advised to call to schedule appt.  Lung Cancer Screening: (Low Dose CT Chest recommended if Age 72-80 years, 20 pack-year currently smoking OR have quit w/in 15years.) does not qualify.   Lung Cancer Screening Referral: PET SCAN DONE 03/13/2023  Additional Screening:  Hepatitis C Screening: does qualify; Completed DECLINED  Vision Screening: Recommended annual ophthalmology exams for early detection of glaucoma and other disorders of the  eye. Is the patient up to date with their annual eye exam?  Yes  Who is the provider or what is the name of the office in which the patient attends annual eye exams? UNABLE TO REMEMBER NAME If pt is not established with a provider, would they like to be referred to a provider to establish care? No .   Dental Screening: Recommended annual dental exams for proper oral hygiene  Diabetic Foot Exam: PT IS NOT DIABETIC  Community Resource Referral / Chronic Care Management: CRR required this visit?  No   CCM required this visit?  No     Plan:     I have personally reviewed and noted the following in the patient's chart:   Medical and social history Use of alcohol, tobacco or illicit drugs  Current medications and supplements including opioid prescriptions. Patient is not currently taking opioid prescriptions. Functional ability and status Nutritional status Physical activity Advanced directives List of other physicians Hospitalizations, surgeries, and ER visits in previous 12 months Vitals Screenings to include cognitive, depression, and falls Referrals and appointments  In addition, I have reviewed and discussed with patient certain preventive protocols, quality metrics, and best practice recommendations. A written personalized care plan for preventive services as well as general preventive health recommendations were provided to patient.     Darral Dash, LPN   [id number]   After Visit  Summary: (Mail) Due to this being a telephonic visit, the after visit summary with patients personalized plan was offered to patient via mail   Nurse Notes: Discussed vaccines, bone density, mammogram and colonoscopy need. Pt declined at this time due to starting radiation therapy next month.

## 2023-05-13 ENCOUNTER — Ambulatory Visit
Admission: RE | Admit: 2023-05-13 | Discharge: 2023-05-13 | Disposition: A | Discharge status: Home / Self Care | Payer: Medicare Other | Source: Ambulatory Visit | Attending: Radiation Oncology | Admitting: Radiation Oncology

## 2023-05-13 ENCOUNTER — Other Ambulatory Visit: Payer: Self-pay

## 2023-05-13 DIAGNOSIS — C3412 Malignant neoplasm of upper lobe, left bronchus or lung: Secondary | ICD-10-CM | POA: Insufficient documentation

## 2023-05-13 DIAGNOSIS — Z510 Encounter for antineoplastic radiation therapy: Secondary | ICD-10-CM | POA: Insufficient documentation

## 2023-05-13 LAB — RAD ONC ARIA SESSION SUMMARY
Course Elapsed Days: 0
Plan Fractions Treated to Date: 1
Plan Prescribed Dose Per Fraction: 18 Gy
Plan Total Fractions Prescribed: 3
Plan Total Prescribed Dose: 54 Gy
Reference Point Dosage Given to Date: 18 Gy
Reference Point Session Dosage Given: 18 Gy
Session Number: 1

## 2023-05-14 ENCOUNTER — Ambulatory Visit: Payer: Medicare Other

## 2023-05-15 ENCOUNTER — Ambulatory Visit: Payer: Medicare Other | Admitting: Radiation Oncology

## 2023-05-16 ENCOUNTER — Ambulatory Visit
Admission: RE | Admit: 2023-05-16 | Discharge: 2023-05-16 | Disposition: A | Discharge status: Home / Self Care | Payer: Medicare Other | Source: Ambulatory Visit | Attending: Radiation Oncology | Admitting: Radiation Oncology

## 2023-05-16 ENCOUNTER — Ambulatory Visit: Payer: Medicare Other

## 2023-05-16 ENCOUNTER — Other Ambulatory Visit: Payer: Self-pay

## 2023-05-16 DIAGNOSIS — C3412 Malignant neoplasm of upper lobe, left bronchus or lung: Secondary | ICD-10-CM | POA: Diagnosis not present

## 2023-05-16 DIAGNOSIS — Z510 Encounter for antineoplastic radiation therapy: Secondary | ICD-10-CM | POA: Diagnosis not present

## 2023-05-16 LAB — RAD ONC ARIA SESSION SUMMARY
Course Elapsed Days: 3
Plan Fractions Treated to Date: 2
Plan Prescribed Dose Per Fraction: 18 Gy
Plan Total Fractions Prescribed: 3
Plan Total Prescribed Dose: 54 Gy
Reference Point Dosage Given to Date: 36 Gy
Reference Point Session Dosage Given: 18 Gy
Session Number: 2

## 2023-05-19 ENCOUNTER — Ambulatory Visit: Payer: Medicare Other | Admitting: Radiation Oncology

## 2023-05-20 ENCOUNTER — Ambulatory Visit
Admission: RE | Admit: 2023-05-20 | Discharge: 2023-05-20 | Disposition: A | Discharge status: Home / Self Care | Payer: Medicare Other | Source: Ambulatory Visit | Attending: Radiation Oncology | Admitting: Radiation Oncology

## 2023-05-20 ENCOUNTER — Other Ambulatory Visit: Payer: Self-pay

## 2023-05-20 DIAGNOSIS — C3412 Malignant neoplasm of upper lobe, left bronchus or lung: Secondary | ICD-10-CM | POA: Diagnosis not present

## 2023-05-20 DIAGNOSIS — Z510 Encounter for antineoplastic radiation therapy: Secondary | ICD-10-CM | POA: Diagnosis not present

## 2023-05-20 DIAGNOSIS — C342 Malignant neoplasm of middle lobe, bronchus or lung: Secondary | ICD-10-CM | POA: Diagnosis not present

## 2023-05-20 LAB — RAD ONC ARIA SESSION SUMMARY
Course Elapsed Days: 7
Plan Fractions Treated to Date: 3
Plan Prescribed Dose Per Fraction: 18 Gy
Plan Total Fractions Prescribed: 3
Plan Total Prescribed Dose: 54 Gy
Reference Point Dosage Given to Date: 54 Gy
Reference Point Session Dosage Given: 18 Gy
Session Number: 3

## 2023-05-21 ENCOUNTER — Ambulatory Visit: Payer: Medicare Other | Admitting: Radiation Oncology

## 2023-05-21 NOTE — Radiation Completion Notes (Signed)
Patient Name: [name], [name] MRN: [id number] Date of Birth: 12-30-1955 Referring Physician: Levy Pupa, M.D. Date of Service: 2023-05-21 Radiation Oncologist: Margaretmary Bayley, M.D. [facility]                             RADIATION ONCOLOGY END OF TREATMENT NOTE     Diagnosis: C34.12 Malignant neoplasm of upper lobe, left bronchus or lung Intent: Curative     ==========DELIVERED PLANS==========  First Treatment Date: 2023-05-13 Last Treatment Date: 2023-05-20   Plan Name: Lung_L_SBRT Site: Lung, Left Technique: SBRT/SRT-IMRT Mode: Photon Dose Per Fraction: 18 Gy Prescribed Dose (Delivered / Prescribed): 54 Gy / 54 Gy Prescribed Fxs (Delivered / Prescribed): 3 / 3     ==========ON TREATMENT VISIT DATES========== 2023-05-13, 2023-05-16, 2023-05-20, 2023-05-20     ==========UPCOMING VISITS==========       ==========APPENDIX - ON TREATMENT VISIT NOTES==========   See weekly On Treatment Notes in Epic for details in the Media tab (listed as Progress notes on the On Treatment Visit Dates listed above).

## 2023-05-23 ENCOUNTER — Ambulatory Visit: Payer: Medicare Other | Admitting: Radiation Oncology

## 2023-05-27 ENCOUNTER — Ambulatory Visit (INDEPENDENT_AMBULATORY_CARE_PROVIDER_SITE_OTHER): Payer: Medicare Other

## 2023-05-27 ENCOUNTER — Ambulatory Visit
Admission: EM | Admit: 2023-05-27 | Discharge: 2023-05-27 | Disposition: A | Discharge status: Home / Self Care | Payer: Medicare Other | Attending: Nurse Practitioner | Admitting: Nurse Practitioner

## 2023-05-27 DIAGNOSIS — R0609 Other forms of dyspnea: Secondary | ICD-10-CM | POA: Diagnosis not present

## 2023-05-27 DIAGNOSIS — Z8709 Personal history of other diseases of the respiratory system: Secondary | ICD-10-CM

## 2023-05-27 DIAGNOSIS — R062 Wheezing: Secondary | ICD-10-CM | POA: Diagnosis not present

## 2023-05-27 DIAGNOSIS — Z85118 Personal history of other malignant neoplasm of bronchus and lung: Secondary | ICD-10-CM

## 2023-05-27 DIAGNOSIS — J22 Unspecified acute lower respiratory infection: Secondary | ICD-10-CM

## 2023-05-27 DIAGNOSIS — R059 Cough, unspecified: Secondary | ICD-10-CM | POA: Diagnosis not present

## 2023-05-27 DIAGNOSIS — R0600 Dyspnea, unspecified: Secondary | ICD-10-CM | POA: Diagnosis not present

## 2023-05-27 MED ORDER — PREDNISONE 20 MG PO TABS: 40.0000 mg | ORAL_TABLET | Freq: Every day | ORAL | 0 refills | Status: AC

## 2023-05-27 MED ORDER — AMOXICILLIN-POT CLAVULANATE 875-125 MG PO TABS: 1.0000 | ORAL_TABLET | Freq: Two times a day (BID) | ORAL | 0 refills | Status: DC

## 2023-05-27 MED ORDER — PROMETHAZINE-DM 6.25-15 MG/5ML PO SYRP: 5.0000 mL | ORAL_SOLUTION | Freq: Four times a day (QID) | ORAL | 0 refills | Status: DC | PRN

## 2023-05-27 NOTE — Discharge Instructions (Signed)
Chest x-ray is pending.  You will be contacted if the pending test result is abnormal.  You will also have access to the results via MyChart. Take medication as prescribed. Increase fluids and allow for plenty of rest. May take over-the-counter Tylenol as needed for pain, fever, or general discomfort. Warm salt water gargles 3-4 times daily as needed for throat pain or discomfort.  Also recommend the use of Chloraseptic throat spray and throat lozenges while symptoms persist. May use normal saline nasal spray for nasal congestion and runny nose. For your cough, recommend using humidifier in your bedroom at nighttime during sleep. If symptoms do not improve with this treatment, please follow-up with your primary care physician or oncologist for further evaluation. Go to the emergency department immediately if you experience worsening cough, shortness of breath, difficulty breathing, or other concerns. Follow-up as needed.

## 2023-05-27 NOTE — ED Provider Notes (Signed)
[facility]    CSN: [id number] Arrival date & time: 05/27/23  1646      History   Chief Complaint Chief Complaint  Patient presents with   Cough   Nasal Congestion   Fatigue    HPI [name] is a 67 y.o. female.   The history is provided by the patient.   Patient presents for complaints of fever, fatigue, cough, shortness of breath, and chest congestion that been present for the past several days.  Patient states last fever was earlier this afternoon, was around 101.  Patient reports that she does have a history of COPD and history of lung CA.  Patient states that she does have a albuterol inhaler that she has been using for dyspnea on exertion.  Patient reports that she did take a home COVID/flu test which was negative.  Reports she has been taking NyQuil with minimal relief.  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    Patient Active Problem List   Diagnosis Date Noted   Abnormal liver function tests 05/06/2023   Colon cancer screening 05/06/2023   Change in bowel habit 05/06/2023   Dysphagia 05/06/2023   Rectal bleeding 05/06/2023   Primary cancer of left upper lobe of lung (HCC) 04/30/2023   History of urinary diversion procedure 04/29/2023   Interstitial cystitis 04/29/2023   Allergic rhinitis 02/20/2022   Abnormal CT of the chest 12/26/2021   Leucocytosis 12/13/2021   Hyponatremia 12/13/2021   Thrombocytosis 12/13/2021   Critical illness neuropathy (HCC) 11/15/2021   Debility 11/15/2021   Asymptomatic bacteriuria 11/09/2021   Essential hypertension 11/08/2021   Hyperlipidemia 11/08/2021   Thrombocytopenia (HCC) 11/08/2021   Multiple lung nodules on CT 11/08/2021   Pressure injury of skin 11/04/2021   Acute metabolic encephalopathy    Acute on chronic systolic CHF (congestive  heart failure) (HCC) 09/28/2021   Hypernatremia 09/27/2021   Lactic acidosis 09/26/2021   GERD (gastroesophageal reflux disease) 09/25/2021   Hyperglycemia 09/25/2021   Anxiety 09/24/2021   Physical deconditioning 02/06/2021   Stridor 12/28/2020   Shortness of breath 12/19/2020   Hypokalemia 05/01/2019   Ileus (HCC) 04/28/2019   SIRS (systemic inflammatory response syndrome) (HCC) 04/28/2019   Chronic pain 04/28/2019   AKI (acute kidney injury) (HCC) 04/28/2019   COPD, severe (HCC) 08/16/2013    Past Surgical History:  Procedure Laterality Date   ABDOMINAL HYSTERECTOMY     BACK SURGERY     ILEO LOOP NEOBLADDER     VESICOVAGINAL FISTULA REPAIR      OB History   No obstetric history on file.      Home Medications    Prior to Admission medications   Medication Sig Start Date End Date Taking? Authorizing Provider  amoxicillin-clavulanate (AUGMENTIN) 875-125 MG tablet Take 1 tablet by mouth every 12 (twelve) hours. 05/27/23  Yes Leath-Warren, Sadie Haber, NP  predniSONE (DELTASONE) 20 MG tablet Take 2 tablets (40 mg total) by mouth daily with breakfast for 5 days. 05/27/23 06/01/23 Yes Leath-Warren, Sadie Haber, NP  promethazine-dextromethorphan (PROMETHAZINE-DM) 6.25-15 MG/5ML syrup Take 5 mLs by mouth 4 (four) times daily as needed. 05/27/23  Yes Leath-Warren, Sadie Haber, NP  albuterol (VENTOLIN HFA) 108 (90 Base) MCG/ACT inhaler INHALE 2 PUFFS INTO THE LUNGS EVERY 6 HOURS AS NEEDED FOR WHEEZE/SHORTNESS OF BREATH  04/10/23   [name], MD  ALPRAZolam Prudy Feeler) 1 MG tablet Take 1 tablet (1 mg total) by mouth 2 (two) times daily. 04/10/23   [name], MD  Azelastine-Fluticasone 137-50 MCG/ACT SUSP SPRAY 2 SPRAYS TWICE A DAY BY INTRANASAL ROUTE. 04/10/23   [name], MD  escitalopram (LEXAPRO) 10 MG tablet Take 1 tablet (10 mg total) by mouth daily. 05/06/23   [name], MD  fluticasone (FLONASE) 50 MCG/ACT nasal spray Place 1 spray into both nostrils  daily as needed for allergies. 04/10/23   [name], MD  loratadine-pseudoephedrine (CLARITIN-D 24 HOUR) 10-240 MG 24 hr tablet Take 1 tablet by mouth daily. 04/10/23   [name], MD  metoprolol tartrate (LOPRESSOR) 25 MG tablet Take 0.5 tablets (12.5 mg total) by mouth daily. 04/10/23   [name], MD  montelukast (SINGULAIR) 10 MG tablet Take 1 tablet (10 mg total) by mouth daily. 04/10/23   [name], MD  Multiple Vitamin (MULTIVITAMIN WITH MINERALS) TABS tablet Take 1 tablet by mouth daily. 11/20/21   Love, Evlyn Kanner, PA-C  pantoprazole (PROTONIX) 40 MG tablet Take 1 tablet (40 mg total) by mouth daily. 04/10/23   [name], MD  rosuvastatin (CRESTOR) 20 MG tablet Take 1 tablet (20 mg total) by mouth daily. 04/10/23   [name], MD  spironolactone (ALDACTONE) 25 MG tablet Take 1 tablet (25 mg total) by mouth daily. 04/10/23   [name], MD  Tiotropium Bromide-Olodaterol (STIOLTO RESPIMAT) 2.5-2.5 MCG/ACT AERS INHALE 2 PUFFS BY MOUTH INTO THE LUNGS DAILY. 04/10/23   [name], MD  traMADol (ULTRAM) 50 MG tablet Take 1 tablet (50 mg total) by mouth 4 (four) times daily -  before meals and at bedtime. 04/10/23   [name], MD    Family History Family History  Problem Relation Age of Onset   Cancer Mother    Heart disease Father    Atrial fibrillation Sister    Heart disease Maternal Grandmother     Social History Social History   Tobacco Use   Smoking status: Former    Current packs/day: 0.00    Average packs/day: 2.0 packs/day for 45.0 years (90.0 ttl pk-yrs)    Types: Cigarettes    Start date: 04/10/1974    Quit date: 04/11/2019    Years since quitting: 4.1   Smokeless tobacco: Never  Vaping Use   Vaping status: Never Used  Substance Use Topics   Alcohol use: No   Drug use: Never     Allergies   Patient has no known allergies.   Review of Systems Review of Systems Per HPI  Physical Exam Triage  Vital Signs ED Triage Vitals [05/27/23 1800]  Encounter Vitals Group     BP 114/70     Systolic BP Percentile      Diastolic BP Percentile      Pulse Rate 90     Resp 16     Temp 98.6 F (37 C)     Temp Source Oral     SpO2 95 %     Weight      Height      Head Circumference      Peak Flow      Pain Score      Pain Loc      Pain Education      Exclude from Growth Chart    No data found.  Updated Vital Signs BP 114/70 (BP  Location: Left Arm)   Pulse 90   Temp 98.6 F (37 C) (Oral)   Resp 16   SpO2 95%   Visual Acuity Right Eye Distance:   Left Eye Distance:   Bilateral Distance:    Right Eye Near:   Left Eye Near:    Bilateral Near:     Physical Exam Vitals and nursing note reviewed.  Constitutional:      General: She is not in acute distress.    Appearance: Normal appearance.  HENT:     Head: Normocephalic.     Right Ear: Tympanic membrane, ear canal and external ear normal.     Left Ear: Tympanic membrane, ear canal and external ear normal.     Nose: Congestion present.     Right Turbinates: Enlarged and swollen.     Left Turbinates: Enlarged and swollen.     Right Sinus: No maxillary sinus tenderness or frontal sinus tenderness.     Left Sinus: No maxillary sinus tenderness or frontal sinus tenderness.     Mouth/Throat:     Lips: Pink.     Mouth: Mucous membranes are moist.     Pharynx: Oropharynx is clear. Posterior oropharyngeal erythema and postnasal drip present. No pharyngeal swelling.     Tonsils: No tonsillar exudate or tonsillar abscesses.  Eyes:     Extraocular Movements: Extraocular movements intact.     Conjunctiva/sclera: Conjunctivae normal.     Pupils: Pupils are equal, round, and reactive to light.  Cardiovascular:     Rate and Rhythm: Normal rate and regular rhythm.     Pulses: Normal pulses.     Heart sounds: Normal heart sounds.  Pulmonary:     Effort: Pulmonary effort is normal. No respiratory distress.     Breath sounds:  Normal breath sounds. No stridor. No wheezing, rhonchi or rales.  Abdominal:     General: Bowel sounds are normal.     Palpations: Abdomen is soft.     Tenderness: There is no abdominal tenderness.  Musculoskeletal:     Cervical back: Normal range of motion.  Lymphadenopathy:     Cervical: No cervical adenopathy.  Skin:    General: Skin is warm and dry.  Neurological:     General: No focal deficit present.     Mental Status: She is alert and oriented to person, place, and time.  Psychiatric:        Mood and Affect: Mood normal.        Behavior: Behavior normal.      UC Treatments / Results  Labs (all labs ordered are listed, but only abnormal results are displayed) Labs Reviewed - No data to display  EKG   Radiology No results found.  Procedures Procedures (including critical care time)  Medications Ordered in UC Medications - No data to display  Initial Impression / Assessment and Plan / UC Course  I have reviewed the triage vital signs and the nursing notes.  Pertinent labs & imaging results that were available during my care of the patient were reviewed by me and considered in my medical decision making (see chart for details).  Chest x-ray is pending.  Patient with underlying history of COPD and is currently being treated for lung CA.  Given patient's history and current symptoms with ongoing fever, will treat empirically for lower respiratory infection/COPD exacerbation with Augmentin 875/125 mg twice daily for the next 7 days, prednisone 40 mg for 5 days, and Promethazine DM for cough.  Supportive care recommendations  were provided and discussed with the patient to include fluids, rest, warm salt water gargles, and use of a humidifier at bedtime during sleep.  Discussed indications with the patient regarding when follow-up in the emergency department would be indicated.  Recommend following up with PCP/oncologist for further evaluation if symptoms fail to improve.   Patient was in agreement with this plan of care and verbalized understanding.  All questions were answered.  Patient stable for discharge.   Final Clinical Impressions(s) / UC Diagnoses   Final diagnoses:  Lower respiratory infection  Dyspnea on exertion  History of COPD  History of lung cancer     Discharge Instructions      Chest x-ray is pending.  You will be contacted if the pending test result is abnormal.  You will also have access to the results via MyChart. Take medication as prescribed. Increase fluids and allow for plenty of rest. May take over-the-counter Tylenol as needed for pain, fever, or general discomfort. Warm salt water gargles 3-4 times daily as needed for throat pain or discomfort.  Also recommend the use of Chloraseptic throat spray and throat lozenges while symptoms persist. May use normal saline nasal spray for nasal congestion and runny nose. For your cough, recommend using humidifier in your bedroom at nighttime during sleep. If symptoms do not improve with this treatment, please follow-up with your primary care physician or oncologist for further evaluation. Go to the emergency department immediately if you experience worsening cough, shortness of breath, difficulty breathing, or other concerns. Follow-up as needed.      ED Prescriptions     Medication Sig Dispense Auth. Provider   amoxicillin-clavulanate (AUGMENTIN) 875-125 MG tablet Take 1 tablet by mouth every 12 (twelve) hours. 14 tablet Leath-Warren, Sadie Haber, NP   promethazine-dextromethorphan (PROMETHAZINE-DM) 6.25-15 MG/5ML syrup Take 5 mLs by mouth 4 (four) times daily as needed. 118 mL Leath-Warren, Sadie Haber, NP   predniSONE (DELTASONE) 20 MG tablet Take 2 tablets (40 mg total) by mouth daily with breakfast for 5 days. 10 tablet Leath-Warren, Sadie Haber, NP      PDMP not reviewed this encounter.   Abran Cantor, NP 05/27/23 1850

## 2023-05-27 NOTE — ED Triage Notes (Signed)
Patient presents with cough,congestion and fatigue x 1 week. Patient took a covid test over the weekend and it was negative.

## 2023-05-28 NOTE — Progress Notes (Addendum)
Patient had recent hospitalization and was diagnosed with lower respiratory infection. Let Morrie Sheldon from Dr. Haywood Pao office know patient will need to be asymptomatic for 2 weeks prior.

## 2023-06-09 ENCOUNTER — Other Ambulatory Visit: Payer: Self-pay | Admitting: Family Medicine

## 2023-06-17 ENCOUNTER — Ambulatory Visit: Payer: Medicare Other | Admitting: Adult Health

## 2023-06-17 ENCOUNTER — Ambulatory Visit
Admission: RE | Admit: 2023-06-17 | Discharge: 2023-06-17 | Disposition: A | Discharge status: Home / Self Care | Payer: Medicare Other | Source: Ambulatory Visit | Attending: Radiation Oncology | Admitting: Radiation Oncology

## 2023-06-17 DIAGNOSIS — Z510 Encounter for antineoplastic radiation therapy: Secondary | ICD-10-CM | POA: Insufficient documentation

## 2023-06-17 DIAGNOSIS — C3412 Malignant neoplasm of upper lobe, left bronchus or lung: Secondary | ICD-10-CM | POA: Insufficient documentation

## 2023-06-17 NOTE — Progress Notes (Signed)
  Radiation Oncology         [phone]) [phone] ________________________________  Name: [name] MRN: [id number]  Date of Service: 06/17/2023  DOB: 1956/05/01  Post Treatment Telephone Note  Diagnosis:  C34.12 Malignant neoplasm of upper lobe, left bronchus or lung (as documented in provider EOT note)  The patient was available for call today.   Symptoms of fatigue have improved since completing therapy.  Symptoms of skin changes have improved since completing therapy.  Symptoms of esophagitis have improved since completing therapy.  The patient has a scheduled follow up with her medical PCP Dr. Butler and was encouraged to call if she develops concerns or questions regarding radiation.   This concludes the interaction.  Rosaline Minerva, LPN

## 2023-07-24 ENCOUNTER — Encounter: Payer: Self-pay | Admitting: Adult Health

## 2023-07-24 ENCOUNTER — Ambulatory Visit: Payer: Medicare Other | Admitting: Adult Health

## 2023-07-24 VITALS — BP 105/68 | HR 70 | Temp 97.9°F | Ht 62.0 in | Wt 117.2 lb

## 2023-07-24 DIAGNOSIS — R911 Solitary pulmonary nodule: Secondary | ICD-10-CM

## 2023-07-24 DIAGNOSIS — C3412 Malignant neoplasm of upper lobe, left bronchus or lung: Secondary | ICD-10-CM | POA: Diagnosis not present

## 2023-07-24 DIAGNOSIS — J449 Chronic obstructive pulmonary disease, unspecified: Secondary | ICD-10-CM | POA: Diagnosis not present

## 2023-07-24 MED ORDER — PREDNISONE 20 MG PO TABS: 20.0000 mg | ORAL_TABLET | Freq: Every day | ORAL | 0 refills | Status: DC

## 2023-07-24 MED ORDER — ALBUTEROL SULFATE (2.5 MG/3ML) 0.083% IN NEBU
2.5000 mg | INHALATION_SOLUTION | Freq: Four times a day (QID) | RESPIRATORY_TRACT | 5 refills | Status: AC | PRN
Start: 2023-07-24 — End: 2024-07-23

## 2023-07-24 NOTE — Patient Instructions (Addendum)
Set up CT chest in April 2025  Prednisone 20mg  daily for 5 days.  Delsym 2 tsp Twice daily  for cough  Continue on Stiolto 2 puffs daily  Saline nasal spray Twice daily  As needed   Albuterol inhaler or neb  As needed   Follow up with Dr. Delton Coombes  3 months and As needed

## 2023-07-24 NOTE — Progress Notes (Signed)
@Patient  ID: [name], female    DOB: 1955/11/16, 68 y.o.   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  Emphysema noted.   10/2019 pulmonary function testing today shows severe COPD with an FEV1 at 38%, ratio 51, FVC 57%, positive bronchodilator response, severe mid flow obstruction with significant reversibility, DLCO 63% Walk test no desats   07/24/2023 Follow up : COPD with Emphysema and lung nodules Patient presents for a 102-month follow-up.  Patient has severe COPD with emphysema.  She also is followed for lung nodules.  She participates in the lung cancer CT chest screening program.  CT chest February 17, 2023 showed a lung RADS 4B nodule in the left upper lobe measuring 14.4 mm increased from 10.42mm.  PET scan on March 13, 2023 showed 12 mm hypermetabolic nodular opacity in the left upper lobe max SUV 6.9.  No hypermetabolic thoracic lymphadenopathy, focal hypermetabolism along the scarring of the left upper lobe nonspecific.  Favors infectious/inflammatory.  This was highly suspicious for malignancy.  She was not a surgical candidate.  And high risk for bronchoscopy/anesthesia.  She was referred to radiation oncology and underwent empiric SBRT.  This was completed on May 20, 2023. Says she did well, tolerated okay.  Denies any fever, unintentional weight loss, discolored mucus or hemoptysis   Overall patient says  she is doing okay until about 3 days ago, noticed increased wheezing and tightness with dyspnea. Minimal nonproductive cough.  Remains on Stiolto 2 puffs daily, Singulair daily, Astelin. She has a lot of stress on her.  Husband has metastatic lung cancer .  He is wheelchair bound. She is primary caregiver.    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    Tobacco History: Social History   Tobacco Use  Smoking Status Former   Current packs/day: 0.00   Average packs/day: 2.0 packs/day for 45.0 years (90.0 ttl pk-yrs)   Types: Cigarettes   Start date: 04/10/1974   Quit date: 04/11/2019   Years since quitting: 4.2  Smokeless Tobacco Never   Counseling given: Not Answered   Outpatient Medications Prior to Visit  Medication Sig Dispense Refill   albuterol (VENTOLIN HFA) 108 (90 Base) MCG/ACT inhaler INHALE 2 PUFFS INTO THE LUNGS EVERY 6 HOURS AS NEEDED FOR WHEEZE/SHORTNESS OF BREATH 36 each 11   ALPRAZolam (XANAX) 1 MG tablet TAKE 1 TABLET(1 MG) BY MOUTH TWICE DAILY 60 tablet 1   amoxicillin-clavulanate (AUGMENTIN) 875-125 MG tablet Take 1 tablet by mouth every  12 (twelve) hours. 14 tablet 0   Azelastine-Fluticasone 137-50 MCG/ACT SUSP SPRAY 2 SPRAYS TWICE A DAY BY INTRANASAL ROUTE. 23 g G   escitalopram (LEXAPRO) 10 MG tablet Take 1 tablet (10 mg total) by mouth daily. 90 tablet 1   fluticasone (FLONASE) 50 MCG/ACT nasal spray Place 1 spray into both  nostrils daily as needed for allergies. 9.9 mL 1   loratadine-pseudoephedrine (CLARITIN-D 24 HOUR) 10-240 MG 24 hr tablet Take 1 tablet by mouth daily. 90 tablet 1   metoprolol tartrate (LOPRESSOR) 25 MG tablet Take 0.5 tablets (12.5 mg total) by mouth daily. 90 tablet 1   montelukast (SINGULAIR) 10 MG tablet Take 1 tablet (10 mg total) by mouth daily. 90 tablet 1   Multiple Vitamin (MULTIVITAMIN WITH MINERALS) TABS tablet Take 1 tablet by mouth daily.     pantoprazole (PROTONIX) 40 MG tablet Take 1 tablet (40 mg total) by mouth daily. 90 tablet 1   promethazine-dextromethorphan (PROMETHAZINE-DM) 6.25-15 MG/5ML syrup Take 5 mLs by mouth 4 (four) times daily as needed. 118 mL 0   rosuvastatin (CRESTOR) 20 MG tablet Take 1 tablet (20 mg total) by mouth daily. 90 tablet 1   spironolactone (ALDACTONE) 25 MG tablet Take 1 tablet (25 mg total) by mouth daily. 90 tablet 1   Tiotropium Bromide-Olodaterol (STIOLTO RESPIMAT) 2.5-2.5 MCG/ACT AERS INHALE 2 PUFFS BY MOUTH INTO THE LUNGS DAILY. 4 g 5   traMADol (ULTRAM) 50 MG tablet Take 1 tablet (50 mg total) by mouth every 6 (six) hours as needed. 90 tablet 0   No facility-administered medications prior to visit.     Review of Systems:   Constitutional:   No  weight loss, night sweats,  Fevers, chills, +fatigue, or  lassitude.  HEENT:   No headaches,  Difficulty swallowing,  Tooth/dental problems, or  Sore throat,                No sneezing, itching, ear ache, nasal congestion, post nasal drip,   CV:  No chest pain,  Orthopnea, PND, swelling in lower extremities, anasarca, dizziness, palpitations, syncope.   GI  No heartburn, indigestion, abdominal pain, nausea, vomiting, diarrhea, change in bowel habits, loss of appetite, bloody stools.   Resp:   No chest wall deformity  Skin: no rash or lesions.  GU: no dysuria, change in color of urine, no urgency or frequency.  No flank pain, no hematuria   MS:  No joint pain or swelling.  No decreased range  of motion.  No back pain.    Physical Exam    GEN: A/Ox3; pleasant , NAD   HEENT:  [place]/AT,  EACs-clear, TMs-wnl, NOSE-clear, THROAT-clear, no lesions, no postnasal drip or exudate noted.   NECK:  Supple w/ fair ROM; no JVD; normal carotid impulses w/o bruits; no thyromegaly or nodules palpated; no lymphadenopathy.    RESP  Clear  P & A; w/o, wheezes/ rales/ or rhonchi. no accessory muscle use, no dullness to percussion  CARD:  RRR, no m/r/g, no peripheral edema, pulses intact, no cyanosis or clubbing.  GI:   Soft & nt; nml bowel sounds; no organomegaly or masses detected.   Musco: Warm bil, no deformities or joint swelling noted.   Neuro: alert, no focal deficits noted.    Skin: Warm, no lesions or rashes    Lab Results:  CBC    Component Value Date/Time   WBC 6.6 03/27/2023 1452   RBC 4.06 03/27/2023 1452   HGB 12.8 03/27/2023 1452   HGB  14.6 03/13/2010 1515   HCT 39.7 03/27/2023 1452   HCT 42.9 03/13/2010 1515   PLT 164 03/27/2023 1452   PLT 136 (L) 03/13/2010 1515   MCV 97.8 03/27/2023 1452   MCV 98.0 03/13/2010 1515   MCH 31.5 03/27/2023 1452   MCHC 32.2 03/27/2023 1452   RDW 13.6 03/27/2023 1452   RDW 14.7 (H) 03/13/2010 1515   LYMPHSABS 1.7 11/15/2021 1045   LYMPHSABS 1.1 03/13/2010 1515   MONOABS 0.8 11/15/2021 1045   MONOABS 0.3 03/13/2010 1515   EOSABS 0.2 11/15/2021 1045   EOSABS 0.1 03/13/2010 1515   BASOSABS 0.0 11/15/2021 1045   BASOSABS 0.0 03/13/2010 1515    BMET    Component Value Date/Time   NA 137 03/27/2023 1452   K 3.5 03/27/2023 1452   CL 99 03/27/2023 1452   CO2 29 03/27/2023 1452   GLUCOSE 89 03/27/2023 1452   BUN 15 03/27/2023 1452   CREATININE 0.77 03/27/2023 1452   CALCIUM 9.2 03/27/2023 1452   GFRNONAA >60 03/27/2023 1452   GFRAA >60 05/14/2019 1651    BNP   ProBNP No results found for: "PROBNP"  Imaging: No results found.  Administration History     None          Latest Ref Rng & Units 11/01/2019     2:58 PM  PFT Results  FVC-Pre L 1.55   FVC-Predicted Pre % 51   FVC-Post L 1.75   FVC-Predicted Post % 57   Pre FEV1/FVC % % 48   Post FEV1/FCV % % 51   FEV1-Pre L 0.75   FEV1-Predicted Pre % 32   FEV1-Post L 0.89   DLCO uncorrected ml/min/mmHg 11.99   DLCO UNC% % 63   DLCO corrected ml/min/mmHg 11.99   DLCO COR %Predicted % 63   DLVA Predicted % 78   TLC L 6.23   TLC % Predicted % 131   RV % Predicted % 237     No results found for: "NITRICOXIDE"      Assessment & Plan:   No problem-specific Assessment & Plan notes found for this encounter.     Rubye Oaks, NP 07/24/2023

## 2023-07-27 NOTE — Assessment & Plan Note (Signed)
Mild COPD flare - treat with short course of steroids  . Continue on LABA/LAMA.   Plan  Patient Instructions  Set up CT chest in April 2025  Prednisone 20mg  daily for 5 days.  Delsym 2 tsp Twice daily  for cough  Continue on Stiolto 2 puffs daily  Saline nasal spray Twice daily  As needed   Albuterol inhaler or neb  As needed   Follow up with Dr. Delton Coombes  3 months and As needed

## 2023-07-27 NOTE — Assessment & Plan Note (Signed)
Enlarging LUL nodule measuring 14.4, hypermetabolic on PET. Considered high risk for Bronchoscopy/anesthesia, not surgical candidate. Underwent SBRT with Rad/Onc -completed Dec 2024.  Tolerated well. Check survelliance CT chest in 2 months .

## 2023-07-28 ENCOUNTER — Ambulatory Visit
Admission: EM | Admit: 2023-07-28 | Discharge: 2023-07-28 | Disposition: A | Discharge status: Home / Self Care | Payer: Medicare Other | Attending: Family Medicine | Admitting: Family Medicine

## 2023-07-28 DIAGNOSIS — J441 Chronic obstructive pulmonary disease with (acute) exacerbation: Secondary | ICD-10-CM | POA: Diagnosis not present

## 2023-07-28 DIAGNOSIS — J069 Acute upper respiratory infection, unspecified: Secondary | ICD-10-CM | POA: Diagnosis not present

## 2023-07-28 LAB — POC COVID19/FLU A&B COMBO
Covid Antigen, POC: NEGATIVE
Influenza A Antigen, POC: NEGATIVE
Influenza B Antigen, POC: NEGATIVE

## 2023-07-28 MED ORDER — AZITHROMYCIN 250 MG PO TABS: 250.0000 mg | ORAL_TABLET | Freq: Every day | ORAL | 0 refills | Status: DC

## 2023-07-28 MED ORDER — PREDNISONE 20 MG PO TABS: 40.0000 mg | ORAL_TABLET | Freq: Every day | ORAL | 0 refills | Status: DC

## 2023-07-28 MED ORDER — PROMETHAZINE-DM 6.25-15 MG/5ML PO SYRP: 5.0000 mL | ORAL_SOLUTION | Freq: Four times a day (QID) | ORAL | 0 refills | Status: DC | PRN

## 2023-07-28 NOTE — ED Triage Notes (Signed)
Pt reports cough congestion headache fever sore throat and wheezing, coughing up yellow colored phlegm x 3 days. Saw pulmonologist on Thursday when she was treated for the wheezing with prednisone.

## 2023-07-28 NOTE — ED Provider Notes (Signed)
[facility]    CSN: [id number] Arrival date & time: 07/28/23  1436      History   Chief Complaint No chief complaint on file.   HPI [name] is a 68 y.o. female.   Presenting today with 3-day history of progressively worsening fever, headache, cough, congestion, wheezing, low oxygen last night at home down to 82%.  States she was wheezing last week at her pulmonology appointment on Thursday and was given prednisone for this which helped slightly but then since then symptoms have escalated.  History of COPD on inhaler regimen consistently, severe pneumonia requiring hospitalization in the past.    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    Patient Active Problem List   Diagnosis Date Noted   Abnormal liver function tests 05/06/2023   Colon cancer screening 05/06/2023   Change in bowel habit 05/06/2023   Dysphagia 05/06/2023   Rectal bleeding 05/06/2023   Primary cancer of left upper lobe of lung (HCC) 04/30/2023   History of urinary diversion procedure 04/29/2023   Interstitial cystitis 04/29/2023   Allergic rhinitis 02/20/2022   Abnormal CT of the chest 12/26/2021   Leucocytosis 12/13/2021   Hyponatremia 12/13/2021   Thrombocytosis 12/13/2021   Critical illness neuropathy (HCC) 11/15/2021   Debility 11/15/2021   Asymptomatic bacteriuria 11/09/2021   Essential hypertension 11/08/2021   Hyperlipidemia 11/08/2021   Thrombocytopenia (HCC) 11/08/2021   Multiple lung nodules on CT 11/08/2021   Pressure injury of skin 11/04/2021   Acute metabolic encephalopathy    Acute on chronic systolic CHF (congestive heart failure) (HCC) 09/28/2021   Hypernatremia 09/27/2021   Lactic acidosis 09/26/2021   GERD (gastroesophageal reflux disease) 09/25/2021   Hyperglycemia 09/25/2021   Anxiety  09/24/2021   Physical deconditioning 02/06/2021   Stridor 12/28/2020   Shortness of breath 12/19/2020   Hypokalemia 05/01/2019   Ileus (HCC) 04/28/2019   SIRS (systemic inflammatory response syndrome) (HCC) 04/28/2019   Chronic pain 04/28/2019   AKI (acute kidney injury) (HCC) 04/28/2019   COPD, severe (HCC) 08/16/2013    Past Surgical History:  Procedure Laterality Date   ABDOMINAL HYSTERECTOMY     BACK SURGERY     ILEO LOOP NEOBLADDER     VESICOVAGINAL FISTULA REPAIR      OB History   No obstetric history on file.      Home Medications    Prior to Admission medications   Medication Sig Start Date End Date Taking? Authorizing Provider  azithromycin (ZITHROMAX) 250 MG tablet Take 1 tablet (250 mg total) by mouth daily. Take first 2 tablets together, then 1 every day until finished. 07/28/23  Yes Particia Nearing, PA-C  predniSONE (DELTASONE) 20 MG tablet Take 2 tablets (40 mg total) by mouth daily with breakfast. 07/28/23  Yes Particia Nearing, PA-C  promethazine-dextromethorphan (PROMETHAZINE-DM) 6.25-15 MG/5ML syrup Take 5 mLs by mouth 4 (four) times daily as needed. 07/28/23  Yes Particia Nearing, PA-C  albuterol (PROVENTIL) (2.5 MG/3ML) 0.083% nebulizer solution Take 3 mLs (2.5 mg total) by nebulization every 6 (six) hours as needed for wheezing or shortness of breath. 07/24/23 07/23/24  Parrett, Virgel Bouquet, NP  albuterol (VENTOLIN HFA) 108 (90 Base) MCG/ACT inhaler INHALE 2 PUFFS INTO THE LUNGS EVERY 6 HOURS AS NEEDED FOR WHEEZE/SHORTNESS OF BREATH 04/10/23  [name], MD  ALPRAZolam Prudy Feeler) 1 MG tablet TAKE 1 TABLET(1 MG) BY MOUTH TWICE DAILY 06/09/23   [name], MD  amoxicillin-clavulanate (AUGMENTIN) 875-125 MG tablet Take 1 tablet by mouth every 12 (twelve) hours. 05/27/23   Leath-Warren, Sadie Haber, NP  Azelastine-Fluticasone 137-50 MCG/ACT SUSP SPRAY 2 SPRAYS TWICE A DAY BY INTRANASAL ROUTE. 04/10/23   [name], MD  escitalopram  (LEXAPRO) 10 MG tablet Take 1 tablet (10 mg total) by mouth daily. 05/06/23   [name], MD  fluticasone (FLONASE) 50 MCG/ACT nasal spray Place 1 spray into both nostrils daily as needed for allergies. 04/10/23   [name], MD  loratadine-pseudoephedrine (CLARITIN-D 24 HOUR) 10-240 MG 24 hr tablet Take 1 tablet by mouth daily. 04/10/23   [name], MD  metoprolol tartrate (LOPRESSOR) 25 MG tablet Take 0.5 tablets (12.5 mg total) by mouth daily. 04/10/23   [name], MD  montelukast (SINGULAIR) 10 MG tablet Take 1 tablet (10 mg total) by mouth daily. 04/10/23   [name], MD  Multiple Vitamin (MULTIVITAMIN WITH MINERALS) TABS tablet Take 1 tablet by mouth daily. 11/20/21   Love, Evlyn Kanner, PA-C  pantoprazole (PROTONIX) 40 MG tablet Take 1 tablet (40 mg total) by mouth daily. 04/10/23   [name], MD  predniSONE (DELTASONE) 20 MG tablet Take 1 tablet (20 mg total) by mouth daily with breakfast. 07/24/23   Parrett, Virgel Bouquet, NP  promethazine-dextromethorphan (PROMETHAZINE-DM) 6.25-15 MG/5ML syrup Take 5 mLs by mouth 4 (four) times daily as needed. 05/27/23   Leath-Warren, Sadie Haber, NP  rosuvastatin (CRESTOR) 20 MG tablet Take 1 tablet (20 mg total) by mouth daily. 04/10/23   [name], MD  spironolactone (ALDACTONE) 25 MG tablet Take 1 tablet (25 mg total) by mouth daily. 04/10/23   [name], MD  Tiotropium Bromide-Olodaterol (STIOLTO RESPIMAT) 2.5-2.5 MCG/ACT AERS INHALE 2 PUFFS BY MOUTH INTO THE LUNGS DAILY. 04/10/23   [name], MD  traMADol (ULTRAM) 50 MG tablet Take 1 tablet (50 mg total) by mouth every 6 (six) hours as needed. 06/09/23   [name], MD    Family History Family History  Problem Relation Age of Onset   Cancer Mother    Heart disease Father    Atrial fibrillation Sister    Heart disease Maternal Grandmother     Social History Social History   Tobacco Use   Smoking status: Former    Current  packs/day: 0.00    Average packs/day: 2.0 packs/day for 45.0 years (90.0 ttl pk-yrs)    Types: Cigarettes    Start date: 04/10/1974    Quit date: 04/11/2019    Years since quitting: 4.2   Smokeless tobacco: Never  Vaping Use   Vaping status: Never Used  Substance Use Topics   Alcohol use: No   Drug use: Never     Allergies   Patient has no known allergies.   Review of Systems Review of Systems PER HPI  Physical Exam Triage Vital Signs ED Triage Vitals  Encounter Vitals Group     BP 07/28/23 1737 118/71     Systolic BP Percentile --      Diastolic BP Percentile --      Pulse Rate 07/28/23 1737 100     Resp 07/28/23 1737 20     Temp 07/28/23 1737 98.2 F (36.8 C)     Temp Source 07/28/23 1737 Oral     SpO2 07/28/23 1737  94 %     Weight --      Height --      Head Circumference --      Peak Flow --      Pain Score 07/28/23 1741 0     Pain Loc --      Pain Education --      Exclude from Growth Chart --    No data found.  Updated Vital Signs BP 118/71 (BP Location: Right Arm)   Pulse 100   Temp 98.2 F (36.8 C) (Oral)   Resp 20   SpO2 94%   Visual Acuity Right Eye Distance:   Left Eye Distance:   Bilateral Distance:    Right Eye Near:   Left Eye Near:    Bilateral Near:     Physical Exam Vitals and nursing note reviewed.  Constitutional:      Appearance: Normal appearance.  HENT:     Head: Atraumatic.     Right Ear: Tympanic membrane and external ear normal.     Left Ear: Tympanic membrane and external ear normal.     Nose: Rhinorrhea present.     Mouth/Throat:     Mouth: Mucous membranes are moist.     Pharynx: Posterior oropharyngeal erythema present.  Eyes:     Extraocular Movements: Extraocular movements intact.     Conjunctiva/sclera: Conjunctivae normal.  Cardiovascular:     Rate and Rhythm: Normal rate and regular rhythm.     Heart sounds: Normal heart sounds.  Pulmonary:     Effort: Pulmonary effort is normal.     Breath sounds:  Wheezing present. No rales.     Comments: Decreased breath sounds throughout Musculoskeletal:        General: Normal range of motion.     Cervical back: Normal range of motion and neck supple.  Skin:    General: Skin is warm and dry.  Neurological:     Mental Status: She is alert and oriented to person, place, and time.  Psychiatric:        Mood and Affect: Mood normal.        Thought Content: Thought content normal.      UC Treatments / Results  Labs (all labs ordered are listed, but only abnormal results are displayed) Labs Reviewed  POC COVID19/FLU A&B COMBO    EKG   Radiology No results found.  Procedures Procedures (including critical care time)  Medications Ordered in UC Medications - No data to display  Initial Impression / Assessment and Plan / UC Course  I have reviewed the triage vital signs and the nursing notes.  Pertinent labs & imaging results that were available during my care of the patient were reviewed by me and considered in my medical decision making (see chart for details).     Rapid flu and COVID-negative, do suspect viral respiratory infection causing a COPD exacerbation.  Given her extensive history of pneumonia will start antibiotics, extend prednisone course, Phenergan DM and Discussed supportive home care and return precautions.  Final Clinical Impressions(s) / UC Diagnoses   Final diagnoses:  Viral URI with cough  COPD exacerbation [facility])   Discharge Instructions   None    ED Prescriptions     Medication Sig Dispense Auth. Provider   predniSONE (DELTASONE) 20 MG tablet Take 2 tablets (40 mg total) by mouth daily with breakfast. 10 tablet Particia Nearing, PA-C   azithromycin (ZITHROMAX) 250 MG tablet Take 1 tablet (250 mg total) by mouth daily.  Take first 2 tablets together, then 1 every day until finished. 6 tablet Particia Nearing, [place]   promethazine-dextromethorphan (PROMETHAZINE-DM) 6.25-15 MG/5ML syrup Take 5 mLs  by mouth 4 (four) times daily as needed. 100 mL Particia Nearing, [place]      PDMP not reviewed this encounter.   Particia Nearing, [place] 07/28/23 1820

## 2023-07-30 ENCOUNTER — Encounter (HOSPITAL_COMMUNITY): Payer: Self-pay

## 2023-07-30 ENCOUNTER — Telehealth: Payer: Medicare Other | Admitting: Family Medicine

## 2023-07-30 ENCOUNTER — Emergency Department (HOSPITAL_COMMUNITY): Payer: Medicare Other

## 2023-07-30 ENCOUNTER — Encounter: Payer: Self-pay | Admitting: Family Medicine

## 2023-07-30 ENCOUNTER — Other Ambulatory Visit: Payer: Self-pay

## 2023-07-30 ENCOUNTER — Emergency Department (HOSPITAL_COMMUNITY)
Admission: EM | Admit: 2023-07-30 | Discharge: 2023-07-30 | Disposition: A | Discharge status: Home / Self Care | Payer: Medicare Other | Attending: Emergency Medicine | Admitting: Emergency Medicine

## 2023-07-30 VITALS — BP 118/76 | HR 112 | Temp 101.8°F

## 2023-07-30 DIAGNOSIS — R0602 Shortness of breath: Secondary | ICD-10-CM | POA: Diagnosis not present

## 2023-07-30 DIAGNOSIS — J111 Influenza due to unidentified influenza virus with other respiratory manifestations: Secondary | ICD-10-CM | POA: Insufficient documentation

## 2023-07-30 DIAGNOSIS — F419 Anxiety disorder, unspecified: Secondary | ICD-10-CM | POA: Diagnosis not present

## 2023-07-30 DIAGNOSIS — J449 Chronic obstructive pulmonary disease, unspecified: Secondary | ICD-10-CM

## 2023-07-30 DIAGNOSIS — J101 Influenza due to other identified influenza virus with other respiratory manifestations: Secondary | ICD-10-CM | POA: Diagnosis not present

## 2023-07-30 DIAGNOSIS — R509 Fever, unspecified: Secondary | ICD-10-CM | POA: Diagnosis not present

## 2023-07-30 DIAGNOSIS — E876 Hypokalemia: Secondary | ICD-10-CM | POA: Insufficient documentation

## 2023-07-30 DIAGNOSIS — C3412 Malignant neoplasm of upper lobe, left bronchus or lung: Secondary | ICD-10-CM

## 2023-07-30 DIAGNOSIS — I10 Essential (primary) hypertension: Secondary | ICD-10-CM | POA: Insufficient documentation

## 2023-07-30 DIAGNOSIS — J439 Emphysema, unspecified: Secondary | ICD-10-CM | POA: Diagnosis not present

## 2023-07-30 HISTORY — DX: Malignant neoplasm of unspecified part of unspecified bronchus or lung: C34.90

## 2023-07-30 LAB — CBC WITH DIFFERENTIAL/PLATELET
Abs Immature Granulocytes: 0.01 10*3/uL (ref 0.00–0.07)
Basophils Absolute: 0 10*3/uL (ref 0.0–0.1)
Basophils Relative: 0 %
Eosinophils Absolute: 0 10*3/uL (ref 0.0–0.5)
Eosinophils Relative: 0 %
HCT: 47.1 % — ABNORMAL HIGH (ref 36.0–46.0)
Hemoglobin: 14.9 g/dL (ref 12.0–15.0)
Immature Granulocytes: 0 %
Lymphocytes Relative: 7 %
Lymphs Abs: 0.4 10*3/uL — ABNORMAL LOW (ref 0.7–4.0)
MCH: 31.9 pg (ref 26.0–34.0)
MCHC: 31.6 g/dL (ref 30.0–36.0)
MCV: 100.9 fL — ABNORMAL HIGH (ref 80.0–100.0)
Monocytes Absolute: 0.3 10*3/uL (ref 0.1–1.0)
Monocytes Relative: 6 %
Neutro Abs: 4.1 10*3/uL (ref 1.7–7.7)
Neutrophils Relative %: 87 %
Platelets: 123 10*3/uL — ABNORMAL LOW (ref 150–400)
RBC: 4.67 MIL/uL (ref 3.87–5.11)
RDW: 14 % (ref 11.5–15.5)
WBC: 4.8 10*3/uL (ref 4.0–10.5)
nRBC: 0 % (ref 0.0–0.2)

## 2023-07-30 LAB — BASIC METABOLIC PANEL
Anion gap: 14 (ref 5–15)
BUN: 13 mg/dL (ref 8–23)
CO2: 26 mmol/L (ref 22–32)
Calcium: 9.6 mg/dL (ref 8.9–10.3)
Chloride: 97 mmol/L — ABNORMAL LOW (ref 98–111)
Creatinine, Ser: 1 mg/dL (ref 0.44–1.00)
GFR, Estimated: >60 mL/min (ref >60)
Glucose, Bld: 134 mg/dL — ABNORMAL HIGH (ref 70–99)
Potassium: 2.9 mmol/L — ABNORMAL LOW (ref 3.5–5.1)
Sodium: 137 mmol/L (ref 135–145)

## 2023-07-30 LAB — RESP PANEL BY RT-PCR (RSV, FLU A&B, COVID)  RVPGX2
Influenza A by PCR: POSITIVE — AB
Influenza B by PCR: NEGATIVE
Resp Syncytial Virus by PCR: NEGATIVE
SARS Coronavirus 2 by RT PCR: NEGATIVE

## 2023-07-30 MED ORDER — BENZONATATE 200 MG PO CAPS: 200.0000 mg | ORAL_CAPSULE | Freq: Three times a day (TID) | ORAL | 0 refills | Status: AC | PRN

## 2023-07-30 MED ORDER — ONDANSETRON HCL 4 MG PO TABS: 4.0000 mg | ORAL_TABLET | Freq: Four times a day (QID) | ORAL | 0 refills | Status: AC

## 2023-07-30 MED ORDER — ONDANSETRON 4 MG PO TBDP
4.0000 mg | ORAL_TABLET | Freq: Once | ORAL | Status: AC
  Administered 2023-07-30: 4 mg via ORAL
  Filled 2023-07-30: qty 1

## 2023-07-30 MED ORDER — POTASSIUM CHLORIDE CRYS ER 20 MEQ PO TBCR: 20.0000 meq | EXTENDED_RELEASE_TABLET | Freq: Two times a day (BID) | ORAL | 0 refills | Status: AC

## 2023-07-30 MED ORDER — POTASSIUM CHLORIDE CRYS ER 20 MEQ PO TBCR
40.0000 meq | EXTENDED_RELEASE_TABLET | Freq: Once | ORAL | Status: AC
  Administered 2023-07-30: 40 meq via ORAL
  Filled 2023-07-30: qty 2

## 2023-07-30 NOTE — Progress Notes (Addendum)
Virtual Visit via Video Note  Assessment and Plan: COPD, severe (HCC)  Primary cancer of left upper lobe of lung (HCC)  Shortness of breath  Anxiety  Follow Up Instructions: Recommend patient call EMS and go the ER for further evaluation/treatment. Pt agrees and will do this after Telemedicine visit. Test results were reviewed and analyzed as part of the medical decision making of this visit. Reviewed previous Urgent Care notes and pulmonary consult notes during the office visit.   No follow-ups on file.   I discussed the assessment and treatment plan with the patient. The patient was provided an opportunity to ask questions, and all were answered. The patient agreed with the plan and demonstrated an understanding of the instructions.   The patient was advised to call back or seek an in-person evaluation if the symptoms worsen or if the condition fails to improve as anticipated.  The above assessment and management plan was discussed with the patient. The patient verbalized understanding of and has agreed to the management plan.   Penny Hoit, MD  I connected with [name] on 07/30/23 at  3:00 PM EST by a video enabled telemedicine application and verified that I am speaking with the correct person using two identifiers.  Patient Location: Home Provider Location: Office/Clinic  I discussed the limitations, risks, security, and privacy concerns of performing an evaluation and management service by video and the availability of in person appointments. I also discussed with the patient that there may be a patient responsible charge related to this service. The patient expressed understanding and agreed to proceed.  Subjective: PCP: [name], MD  Chief Complaint  Patient presents with   Shortness of Breath    Temp w/tylenol is 101.8 tympanic. Sx started on Saturday 07/26/23. Currently on abx.    68 year old with PMH significant for severe COPD and lung CA with  5 day history of SOB, wheezing, coughing and fever up to 101.5. Pt having nausea today, had diarrhea one time earlier today but not anymore. Pt has had decreased appetite. Pt was seen at [facility] on Monday, tested for flu and COVID which were negative and given Rx for Zpack, prednisone, cough syrup without codeine Pt has one more day of Zithromax, still has enough prednisone. Pt used albuterol inhaler one time. Pt uses Stiolto inhaler consistently.  Pt finished radiation therapy in January. Lung biopsy cannot be performed due to being high risk procedure for her. Pt was hospitalized and intubated for over 3 months last year at [facility] hospital due to pneumonia. Pt does worry about the possibility of having this again.  Pulse ox at home 94% RA during telemedicine visit. Temp 101.5 during visit.  Of note her husband just got out of the hospital with Stage IV cancer and cannot stay at home on his own. Patient will contact her son in law to come and stay with husband.  Pt states she is very anxious and wonders if she can take a xanax prior to calling EMS- she will take 1/2 xanax after virtual visit.    ROS: Per HPI  Current Outpatient Medications:    albuterol (PROVENTIL) (2.5 MG/3ML) 0.083% nebulizer solution, Take 3 mLs (2.5 mg total) by nebulization every 6 (six) hours as needed for wheezing or shortness of breath., Disp: 75 mL, Rfl: 5   albuterol (VENTOLIN HFA) 108 (90 Base) MCG/ACT inhaler, INHALE 2 PUFFS INTO THE LUNGS EVERY 6 HOURS AS NEEDED FOR WHEEZE/SHORTNESS OF BREATH, Disp: 36  each, Rfl: 11   ALPRAZolam (XANAX) 1 MG tablet, TAKE 1 TABLET(1 MG) BY MOUTH TWICE DAILY, Disp: 60 tablet, Rfl: 1   Azelastine-Fluticasone 137-50 MCG/ACT SUSP, SPRAY 2 SPRAYS TWICE A DAY BY INTRANASAL ROUTE., Disp: 23 g, Rfl: G   azithromycin (ZITHROMAX) 250 MG tablet, Take 1 tablet (250 mg total) by mouth daily. Take first 2 tablets together, then 1 every day until finished., Disp: 6 tablet, Rfl: 0   escitalopram  (LEXAPRO) 10 MG tablet, Take 1 tablet (10 mg total) by mouth daily., Disp: 90 tablet, Rfl: 1   fluticasone (FLONASE) 50 MCG/ACT nasal spray, Place 1 spray into both nostrils daily as needed for allergies., Disp: 9.9 mL, Rfl: 1   loratadine-pseudoephedrine (CLARITIN-D 24 HOUR) 10-240 MG 24 hr tablet, Take 1 tablet by mouth daily., Disp: 90 tablet, Rfl: 1   metoprolol tartrate (LOPRESSOR) 25 MG tablet, Take 0.5 tablets (12.5 mg total) by mouth daily., Disp: 90 tablet, Rfl: 1   montelukast (SINGULAIR) 10 MG tablet, Take 1 tablet (10 mg total) by mouth daily., Disp: 90 tablet, Rfl: 1   Multiple Vitamin (MULTIVITAMIN WITH MINERALS) TABS tablet, Take 1 tablet by mouth daily., Disp: , Rfl:    pantoprazole (PROTONIX) 40 MG tablet, Take 1 tablet (40 mg total) by mouth daily., Disp: 90 tablet, Rfl: 1   predniSONE (DELTASONE) 20 MG tablet, Take 2 tablets (40 mg total) by mouth daily with breakfast., Disp: 10 tablet, Rfl: 0   promethazine-dextromethorphan (PROMETHAZINE-DM) 6.25-15 MG/5ML syrup, Take 5 mLs by mouth 4 (four) times daily as needed., Disp: 118 mL, Rfl: 0   promethazine-dextromethorphan (PROMETHAZINE-DM) 6.25-15 MG/5ML syrup, Take 5 mLs by mouth 4 (four) times daily as needed., Disp: 100 mL, Rfl: 0   rosuvastatin (CRESTOR) 20 MG tablet, Take 1 tablet (20 mg total) by mouth daily., Disp: 90 tablet, Rfl: 1   spironolactone (ALDACTONE) 25 MG tablet, Take 1 tablet (25 mg total) by mouth daily., Disp: 90 tablet, Rfl: 1   Tiotropium Bromide-Olodaterol (STIOLTO RESPIMAT) 2.5-2.5 MCG/ACT AERS, INHALE 2 PUFFS BY MOUTH INTO THE LUNGS DAILY., Disp: 4 g, Rfl: 5   traMADol (ULTRAM) 50 MG tablet, Take 1 tablet (50 mg total) by mouth every 6 (six) hours as needed., Disp: 90 tablet, Rfl: 0   amoxicillin-clavulanate (AUGMENTIN) 875-125 MG tablet, Take 1 tablet by mouth every 12 (twelve) hours. (Patient not taking: Reported on 07/30/2023), Disp: 14 tablet, Rfl: 0  Social History   Socioeconomic History   Marital  status: Married    Spouse name: Not on file   Number of children: Not on file   Years of education: Not on file   Highest education level: Some college, no degree  Occupational History   Not on file  Tobacco Use   Smoking status: Former    Current packs/day: 0.00    Average packs/day: 2.0 packs/day for 45.0 years (90.0 ttl pk-yrs)    Types: Cigarettes    Start date: 04/10/1974    Quit date: 04/11/2019    Years since quitting: 4.3   Smokeless tobacco: Never  Vaping Use   Vaping status: Never Used  Substance and Sexual Activity   Alcohol use: No   Drug use: Never   Sexual activity: Yes  Other Topics Concern   Not on file  Social History Narrative   Not on file   Social Drivers of Health   Financial Resource Strain: Low Risk  (04/06/2023)   Overall Financial Resource Strain (CARDIA)    Difficulty of Paying Living Expenses:  Not very hard  Food Insecurity: No Food Insecurity (04/23/2023)   Hunger Vital Sign    Worried About Running Out of Food in the Last Year: Never true    Ran Out of Food in the Last Year: Never true  Transportation Needs: No Transportation Needs (04/23/2023)   PRAPARE - Administrator, Civil Service (Medical): No    Lack of Transportation (Non-Medical): No  Physical Activity: Unknown (04/06/2023)   Exercise Vital Sign    Days of Exercise per Week: 7 days    Minutes of Exercise per Session: Patient declined  Stress: Stress Concern Present (04/06/2023)   [employer] of Occupational Health - Occupational Stress Questionnaire    Feeling of Stress : Very much  Social Connections: Moderately Isolated (04/06/2023)   Social Connection and Isolation Panel [NHANES]    Frequency of Communication with Friends and Family: Twice a week    Frequency of Social Gatherings with Friends and Family: Once a week    Attends Religious Services: Never    Database administrator or Organizations: No    Attends Engineer, structural: Not on file     Marital Status: Married  Catering manager Violence: Not At Risk (05/06/2023)   Humiliation, Afraid, Rape, and Kick questionnaire    Fear of Current or Ex-Partner: No    Emotionally Abused: No    Physically Abused: No    Sexually Abused: No     Observations/Objective: Today's Vitals   07/30/23 1434  BP: 118/76  Pulse: (!) 112  Temp: (!) 101.8 F (38.8 C)  TempSrc: Tympanic  SpO2: 95%  Pulse OX during telemedicine visit was 94% RA Physical Exam Vitals and nursing note reviewed.  Constitutional:      General: She is in acute distress.     Appearance: Normal appearance.  HENT:     Head: Normocephalic.  Eyes:     Conjunctiva/sclera: Conjunctivae normal.     Pupils: Pupils are equal, round, and reactive to light.  Pulmonary:     Effort: Accessory muscle usage and respiratory distress present.  Neurological:     General: No focal deficit present.     Mental Status: She is alert and oriented to person, place, and time.  Psychiatric:        Mood and Affect: Mood is anxious.        Thought Content: Thought content normal.        Judgment: Judgment normal.   I connected with  [name] on 07/30/23 by a video enabled telemedicine application and verified that I am speaking with the correct person using two identifiers.   I discussed the limitations of evaluation and management by telemedicine. The patient expressed understanding and agreed to proceed.

## 2023-07-30 NOTE — Discharge Instructions (Signed)
Your flu test today is positive.  X-ray did not show evidence of pneumonia.  Your potassium level today is low.  This is likely related to your diarrhea symptoms.  You have been given potassium here as well as a prescription to take for several days.  I recommend that you follow-up with your primary care provider in 1 week to have your potassium level rechecked.  Also, rest plenty of fluids and alternating Tylenol and ibuprofen every 4 and 6 hours respectively for body aches and fever.  Return to the ER for any new or worsening symptoms.

## 2023-07-30 NOTE — ED Provider Notes (Signed)
 [facility] AT [facility] Provider Note   CSN: [id number] Arrival date & time: 07/30/23  4098     History  Chief Complaint  Patient presents with   Fever    [name] is a 68 y.o. female.   Fever Associated symptoms: congestion, cough, diarrhea and myalgias   Associated symptoms: no chest pain, no headaches, no nausea, no rash and no vomiting         [name] is a 68 y.o. female past medical history of GERD, COPD, hypertension who presents to the Emergency Department complaining of generalized bodyaches, diarrhea, cough, congestion, diffuse headache and fever.  Symptoms began 2 to 3 days ago.  She was initially seen at urgent care for her symptoms.  She was given prednisone prior to her ER visit by her pulmonologist.  Urgent care extended her prednisone course and started her on a Z-Pak due to her history of COPD and pneumonia that required hospitalization.  She is currently taking the medications without improvement.  She had a televisit earlier today and was recommended to come to the ER for further evaluation.  COVID and flu testing 2 days ago was negative.  She denies having any chest pain, shortness of breath, abdominal pain, black or bloody stools.   Home Medications Prior to Admission medications   Medication Sig Start Date End Date Taking? Authorizing Provider  albuterol (PROVENTIL) (2.5 MG/3ML) 0.083% nebulizer solution Take 3 mLs (2.5 mg total) by nebulization every 6 (six) hours as needed for wheezing or shortness of breath. 07/24/23 07/23/24  Parrett, Virgel Bouquet, NP  albuterol (VENTOLIN HFA) 108 (90 Base) MCG/ACT inhaler INHALE 2 PUFFS INTO THE LUNGS EVERY 6 HOURS AS NEEDED FOR WHEEZE/SHORTNESS OF BREATH 04/10/23   [name], MD  ALPRAZolam Prudy Feeler) 1 MG tablet TAKE 1 TABLET(1 MG) BY MOUTH TWICE DAILY 06/09/23   [name], MD  amoxicillin-clavulanate (AUGMENTIN) 875-125 MG tablet Take 1 tablet by mouth every 12  (twelve) hours. Patient not taking: Reported on 07/30/2023 05/27/23   Leath-Warren, Sadie Haber, NP  Azelastine-Fluticasone 137-50 MCG/ACT SUSP SPRAY 2 SPRAYS TWICE A DAY BY INTRANASAL ROUTE. 04/10/23   [name], MD  azithromycin (ZITHROMAX) 250 MG tablet Take 1 tablet (250 mg total) by mouth daily. Take first 2 tablets together, then 1 every day until finished. 07/28/23   Particia Nearing, PA-C  escitalopram (LEXAPRO) 10 MG tablet Take 1 tablet (10 mg total) by mouth daily. 05/06/23   [name], MD  fluticasone (FLONASE) 50 MCG/ACT nasal spray Place 1 spray into both nostrils daily as needed for allergies. 04/10/23   [name], MD  loratadine-pseudoephedrine (CLARITIN-D 24 HOUR) 10-240 MG 24 hr tablet Take 1 tablet by mouth daily. 04/10/23   [name], MD  metoprolol tartrate (LOPRESSOR) 25 MG tablet Take 0.5 tablets (12.5 mg total) by mouth daily. 04/10/23   [name], MD  montelukast (SINGULAIR) 10 MG tablet Take 1 tablet (10 mg total) by mouth daily. 04/10/23   [name], MD  Multiple Vitamin (MULTIVITAMIN WITH MINERALS) TABS tablet Take 1 tablet by mouth daily. 11/20/21   Love, Evlyn Kanner, PA-C  pantoprazole (PROTONIX) 40 MG tablet Take 1 tablet (40 mg total) by mouth daily. 04/10/23   [name], MD  predniSONE (DELTASONE) 20 MG tablet Take 2 tablets (40 mg total) by mouth daily with breakfast. 07/28/23   Particia Nearing, PA-C  promethazine-dextromethorphan (PROMETHAZINE-DM) 6.25-15 MG/5ML syrup Take  5 mLs by mouth 4 (four) times daily as needed. 05/27/23   Leath-Warren, Sadie Haber, NP  promethazine-dextromethorphan (PROMETHAZINE-DM) 6.25-15 MG/5ML syrup Take 5 mLs by mouth 4 (four) times daily as needed. 07/28/23   Particia Nearing, PA-C  rosuvastatin (CRESTOR) 20 MG tablet Take 1 tablet (20 mg total) by mouth daily. 04/10/23   [name], MD  spironolactone (ALDACTONE) 25 MG tablet Take 1 tablet (25 mg total) by mouth  daily. 04/10/23   [name], MD  Tiotropium Bromide-Olodaterol (STIOLTO RESPIMAT) 2.5-2.5 MCG/ACT AERS INHALE 2 PUFFS BY MOUTH INTO THE LUNGS DAILY. 04/10/23   [name], MD  traMADol (ULTRAM) 50 MG tablet Take 1 tablet (50 mg total) by mouth every 6 (six) hours as needed. 06/09/23   [name], MD      Allergies    Patient has no known allergies.    Review of Systems   Review of Systems  Constitutional:  Positive for appetite change and fever.  HENT:  Positive for congestion.   Respiratory:  Positive for cough and wheezing. Negative for shortness of breath.   Cardiovascular:  Negative for chest pain.  Gastrointestinal:  Positive for diarrhea. Negative for abdominal pain, nausea and vomiting.  Musculoskeletal:  Positive for myalgias.  Skin:  Negative for rash.  Neurological:  Negative for dizziness, weakness and headaches.    Physical Exam Updated Vital Signs BP 128/66 (BP Location: Right Arm)   Pulse (!) 107   Temp 98.9 F (37.2 C) (Oral)   Resp 18   Ht 5\' 2"  (1.575 m)   Wt 53.1 kg   SpO2 98%   BMI 21.42 kg/m  Physical Exam Vitals and nursing note reviewed.  Constitutional:      General: She is not in acute distress.    Appearance: Normal appearance. She is not toxic-appearing.  HENT:     Right Ear: Tympanic membrane and ear canal normal.     Left Ear: Tympanic membrane and ear canal normal.     Mouth/Throat:     Mouth: Mucous membranes are moist.     Pharynx: No oropharyngeal exudate or posterior oropharyngeal erythema.  Cardiovascular:     Rate and Rhythm: Regular rhythm. Tachycardia present.     Pulses: Normal pulses.  Pulmonary:     Effort: Pulmonary effort is normal.  Abdominal:     Palpations: Abdomen is soft.  Musculoskeletal:        General: Normal range of motion.  Skin:    General: Skin is warm.     Capillary Refill: Capillary refill takes less than 2 seconds.  Neurological:     General: No focal deficit present.     Mental  Status: She is alert.     Sensory: No sensory deficit.     Motor: No weakness.     ED Results / Procedures / Treatments   Labs (all labs ordered are listed, but only abnormal results are displayed) Labs Reviewed  RESP PANEL BY RT-PCR (RSV, FLU A&B, COVID)  RVPGX2 - Abnormal; Notable for the following components:      Result Value   Influenza A by PCR POSITIVE (*)    All other components within normal limits  CBC WITH DIFFERENTIAL/PLATELET - Abnormal; Notable for the following components:   HCT 47.1 (*)    MCV 100.9 (*)    Platelets 123 (*)    Lymphs Abs 0.4 (*)    All other components within normal limits  BASIC METABOLIC PANEL - Abnormal;  Notable for the following components:   Potassium 2.9 (*)    Chloride 97 (*)    Glucose, Bld 134 (*)    All other components within normal limits    EKG None  Radiology DG Chest 2 View Result Date: 07/30/2023 CLINICAL DATA:  Shortness of breath. EXAM: CHEST - 2 VIEW COMPARISON:  Chest radiograph dated 05/27/2023. FINDINGS: The heart size and mediastinal contours are unchanged. Similar hyperinflation and diffuse interstitial changes, compatible with advanced emphysema. Stable parenchymal scarring in the bilateral upper lung zones. No focal consolidation, pleural effusion, or pneumothorax. No acute osseous abnormality. IMPRESSION: 1. No acute cardiopulmonary findings. 2. Emphysema with stable bilateral parenchymal scarring. Electronically Signed   By: Hart Robinsons M.D.   On: 07/30/2023 17:03    Procedures Procedures    Medications Ordered in ED Medications  potassium chloride SA (KLOR-CON M) CR tablet 40 mEq (has no administration in time range)    ED Course/ Medical Decision Making/ A&P                                 Medical Decision Making Patient here with flulike symptoms for several days.  Had a negative COVID flu test 2 days ago at urgent care.  Currently taking antibiotics (Z-Pak) and prednisone without improvement of her  symptoms.  Here today after televisit with ER evaluation recommended by the provider.  On my exam, patient well-appearing nontoxic.  Increased work of breathing on exam.  Lung sounds are clear to auscultation.  Mucous membranes are moist  Symptoms are likely to be viral.  Pneumonia, bronchitis, COPD exacerbation also considered.  Amount and/or Complexity of Data Reviewed Labs: ordered.    Details: Labs interpreted by me, no evidence of leukocytosis, chemistries show hypokalemia with potassium of 2.9.  This is likely related to patient's reported diarrhea. Radiology: ordered.    Details: Chest x-ray without any evidence of acute cardiopulmonary finding Discussion of management or test interpretation with external provider(s):  Workup shows flu a positive.  Patient nontoxic-appearing.  Afebrile here, but reported fever at home earlier today.  Mild hypokalemia addressed here.  Chest x-ray without evidence of pneumonia.  No evidence for emergent process.  Will prescribe short course of potassium, antitussive.  She is agreeable to Tylenol ibuprofen for fever and bodyaches.  I recommended close outpatient follow-up in 1 week with her PCP to have her potassium rechecked.  Risk Prescription drug management.           Final Clinical Impression(s) / ED Diagnoses Final diagnoses:  Influenza  Hypokalemia    Rx / DC Orders ED Discharge Orders     None         Rosey Bath 07/30/23 1921    Lonell Grandchild, MD 08/10/23 1151

## 2023-07-30 NOTE — ED Triage Notes (Signed)
Pt arrived via POV from home following a TeleHealth Visit earlier today. Pt was advised to seek further treatment in the ER for a fever at home. Pt does endorse flu-like symptoms.

## 2023-08-01 NOTE — Progress Notes (Signed)
Patient scheduled for endo procedure 2/28, unfortunately got diagnosed with Flu 2/19. Per hospital policy will need to be rescheduled for 4 weeks from diagnosis or 2 weeks symptom free. Notified Ashely at Dr Rolla Etienne office will need to be rescheduled, said would call patient.

## 2023-08-02 DIAGNOSIS — J449 Chronic obstructive pulmonary disease, unspecified: Secondary | ICD-10-CM | POA: Diagnosis not present

## 2023-08-05 ENCOUNTER — Other Ambulatory Visit: Payer: Self-pay | Admitting: Family Medicine

## 2023-08-05 MED ORDER — HYDROCODONE BIT-HOMATROP MBR 5-1.5 MG/5ML PO SOLN: 5.0000 mL | Freq: Three times a day (TID) | ORAL | 0 refills | Status: DC | PRN

## 2023-08-06 ENCOUNTER — Ambulatory Visit (INDEPENDENT_AMBULATORY_CARE_PROVIDER_SITE_OTHER): Payer: Medicare Other | Admitting: Family Medicine

## 2023-08-06 ENCOUNTER — Encounter: Payer: Self-pay | Admitting: Family Medicine

## 2023-08-06 ENCOUNTER — Ambulatory Visit
Admission: RE | Admit: 2023-08-06 | Discharge: 2023-08-06 | Disposition: A | Discharge status: Home / Self Care | Payer: Medicare Other | Source: Ambulatory Visit | Attending: Family Medicine | Admitting: Family Medicine

## 2023-08-06 ENCOUNTER — Ambulatory Visit: Payer: Self-pay | Admitting: Family Medicine

## 2023-08-06 VITALS — BP 130/80 | HR 117 | Temp 97.6°F | Ht 62.0 in | Wt 109.2 lb

## 2023-08-06 DIAGNOSIS — R918 Other nonspecific abnormal finding of lung field: Secondary | ICD-10-CM | POA: Diagnosis not present

## 2023-08-06 DIAGNOSIS — J449 Chronic obstructive pulmonary disease, unspecified: Secondary | ICD-10-CM

## 2023-08-06 DIAGNOSIS — Z87891 Personal history of nicotine dependence: Secondary | ICD-10-CM | POA: Diagnosis not present

## 2023-08-06 DIAGNOSIS — R0600 Dyspnea, unspecified: Secondary | ICD-10-CM | POA: Diagnosis not present

## 2023-08-06 MED ORDER — DOXYCYCLINE HYCLATE 100 MG PO TABS: 100.0000 mg | ORAL_TABLET | Freq: Two times a day (BID) | ORAL | 0 refills | Status: AC

## 2023-08-06 MED ORDER — PREDNISONE 10 MG PO TABS: ORAL_TABLET | ORAL | 0 refills | Status: DC

## 2023-08-06 MED ORDER — PREDNISONE 10 MG PO TABS
ORAL_TABLET | ORAL | 0 refills | Status: AC
Start: 2023-08-06 — End: 2023-08-18

## 2023-08-06 MED ORDER — DOXYCYCLINE HYCLATE 100 MG PO TABS: 100.0000 mg | ORAL_TABLET | Freq: Two times a day (BID) | ORAL | 0 refills | Status: DC

## 2023-08-06 NOTE — Patient Instructions (Signed)
 Nice to see you. I am placing on prednisone and doxycycline.  If your chest x-ray reveals a pneumonia we will add a second antibiotic. If you develop worsening shortness of breath, cough productive of blood, or fevers please be reevaluated.

## 2023-08-06 NOTE — Assessment & Plan Note (Addendum)
 Chronic issue with current flare.  Discussed there is the potential this could represent a pneumonia.  We will have her go have a chest x-ray done today.  Will start her on prednisone and doxycycline as prescribed below.  Advised to seek medical attention for worsening shortness of breath, cough productive of blood, or fevers.  If not improving she should be reevaluated.  Patient was counseled on the risk of diarrhea and skin sensitivity to the sun with the doxycycline.  She will monitor for excessive agitation and sleep issues with the prednisone.

## 2023-08-06 NOTE — Progress Notes (Signed)
 Marikay Alar, MD Phone: [phone]  [name] is a 68 y.o. female who presents today for same-day visit.  Cough: Patient notes continued symptoms after being diagnosed with influenza.  Notes fevers have resolved 3 to 4 days ago.  No significant congestion that she has been coughing and wheezing.  Does not feel as though her nebulizers have been helpful.  She was prescribed prednisone on 07/28/2023.  Also has been on azithromycin recently.  Social History   Tobacco Use  Smoking Status Former   Current packs/day: 0.00   Average packs/day: 2.0 packs/day for 45.0 years (90.0 ttl pk-yrs)   Types: Cigarettes   Start date: 04/10/1974   Quit date: 04/11/2019   Years since quitting: 4.3  Smokeless Tobacco Never    Current Outpatient Medications on File Prior to Visit  Medication Sig Dispense Refill   albuterol (PROVENTIL) (2.5 MG/3ML) 0.083% nebulizer solution Take 3 mLs (2.5 mg total) by nebulization every 6 (six) hours as needed for wheezing or shortness of breath. 75 mL 5   albuterol (VENTOLIN HFA) 108 (90 Base) MCG/ACT inhaler INHALE 2 PUFFS INTO THE LUNGS EVERY 6 HOURS AS NEEDED FOR WHEEZE/SHORTNESS OF BREATH 36 each 11   ALPRAZolam (XANAX) 1 MG tablet TAKE 1 TABLET(1 MG) BY MOUTH TWICE DAILY 60 tablet 1   Azelastine-Fluticasone 137-50 MCG/ACT SUSP SPRAY 2 SPRAYS TWICE A DAY BY INTRANASAL ROUTE. 23 g G   escitalopram (LEXAPRO) 10 MG tablet Take 1 tablet (10 mg total) by mouth daily. 90 tablet 1   fluticasone (FLONASE) 50 MCG/ACT nasal spray Place 1 spray into both nostrils daily as needed for allergies. 9.9 mL 1   HYDROcodone bit-homatropine (HYCODAN) 5-1.5 MG/5ML syrup Take 5 mLs by mouth every 8 (eight) hours as needed for cough. 120 mL 0   loratadine-pseudoephedrine (CLARITIN-D 24 HOUR) 10-240 MG 24 hr tablet Take 1 tablet by mouth daily. 90 tablet 1   metoprolol tartrate (LOPRESSOR) 25 MG tablet Take 0.5 tablets (12.5 mg total) by mouth daily. 90 tablet 1   montelukast  (SINGULAIR) 10 MG tablet Take 1 tablet (10 mg total) by mouth daily. 90 tablet 1   Multiple Vitamin (MULTIVITAMIN WITH MINERALS) TABS tablet Take 1 tablet by mouth daily.     ondansetron (ZOFRAN) 4 MG tablet Take 1 tablet (4 mg total) by mouth every 6 (six) hours. As needed for nausea and vomiting 12 tablet 0   pantoprazole (PROTONIX) 40 MG tablet Take 1 tablet (40 mg total) by mouth daily. 90 tablet 1   potassium chloride SA (KLOR-CON M) 20 MEQ tablet Take 1 tablet (20 mEq total) by mouth 2 (two) times daily. 10 tablet 0   rosuvastatin (CRESTOR) 20 MG tablet Take 1 tablet (20 mg total) by mouth daily. 90 tablet 1   spironolactone (ALDACTONE) 25 MG tablet Take 1 tablet (25 mg total) by mouth daily. 90 tablet 1   Tiotropium Bromide-Olodaterol (STIOLTO RESPIMAT) 2.5-2.5 MCG/ACT AERS INHALE 2 PUFFS BY MOUTH INTO THE LUNGS DAILY. 4 g 5   traMADol (ULTRAM) 50 MG tablet Take 1 tablet (50 mg total) by mouth every 6 (six) hours as needed. 90 tablet 0   benzonatate (TESSALON) 200 MG capsule Take 1 capsule (200 mg total) by mouth 3 (three) times daily as needed. Swallow whole, do not chew 21 capsule 0   No current facility-administered medications on file prior to visit.     ROS see history of present illness  Objective  Physical Exam Vitals:   08/06/23 1430  BP:  130/80  Pulse: (!) 117  Temp: 97.6 F (36.4 C)  SpO2: 94%    BP Readings from Last 3 Encounters:  08/06/23 130/80  07/30/23 128/66  07/30/23 118/76   Wt Readings from Last 3 Encounters:  08/06/23 109 lb 3.2 oz (49.5 kg)  07/30/23 117 lb 1.7 oz (53.1 kg)  07/24/23 117 lb 3.2 oz (53.2 kg)    Physical Exam Constitutional:      General: She is not in acute distress.    Appearance: She is not diaphoretic.  Cardiovascular:     Rate and Rhythm: Regular rhythm. Tachycardia present.     Heart sounds: Normal heart sounds.  Pulmonary:     Effort: Pulmonary effort is normal.     Breath sounds: Wheezing (Scattered throughout,  expiratory) present.     Comments: Bibasilar crackles left greater than right Skin:    General: Skin is warm and dry.  Neurological:     Mental Status: She is alert.      Assessment/Plan: Please see individual problem list.  COPD, severe (HCC) Assessment & Plan: Chronic issue with current flare.  Discussed there is the potential this could represent a pneumonia.  We will have her go have a chest x-ray done today.  Will start her on prednisone and doxycycline as prescribed below.  Advised to seek medical attention for worsening shortness of breath, cough productive of blood, or fevers.  If not improving she should be reevaluated.  Patient was counseled on the risk of diarrhea and skin sensitivity to the sun with the doxycycline.  She will monitor for excessive agitation and sleep issues with the prednisone.  Orders: -     DG Chest 2 View; Future -     predniSONE; Take 4 tablets (40 mg total) by mouth daily with breakfast for 3 days, THEN 3 tablets (30 mg total) daily with breakfast for 3 days, THEN 2 tablets (20 mg total) daily with breakfast for 3 days, THEN 1 tablet (10 mg total) daily with breakfast for 3 days.  Dispense: 30 tablet; Refill: 0 -     Doxycycline Hyclate; Take 1 tablet (100 mg total) by mouth 2 (two) times daily.  Dispense: 14 tablet; Refill: 0    Return if symptoms worsen or fail to improve.   Marikay Alar, MD [facility]

## 2023-08-06 NOTE — Telephone Encounter (Signed)
 Chief Complaint: Difficulty breathing Symptoms: Worsened shortness of breath/potential COPD exacerbation r/t flu Frequency: a week Pertinent Negatives: Patient denies fever Disposition: [] ED /[] Urgent Care (no appt availability in office) / [x] Appointment(In office/virtual)/ []  [facility]/ [] Home Care/ [] Refused Recommended Disposition /[] [facility]/ []  Follow-up with PCP Additional Notes: Patient called in stating she was diagnosed with the flu roughly a week ago, and is having worsened breathing difficulty. Patient has COPD and has been taking breathing treatments and utilizing inhaler and it is not helping. Patient states she does not have a fever, but would like a physician to listen to her lungs as she is having shortness of breath that is worsened than normal. Patient appt made for today for further evaluation.    Copied from CRM [id number]. Topic: Clinical - Red Word Triage >> Aug 06, 2023 10:31 AM Clayton Bibles wrote: Red Word that prompted transfer to Nurse Triage: She has had the flu since last week; no fever now. She has a very bad cough and it's hard to breathe Reason for Disposition  [1] Longstanding difficulty breathing (e.g., CHF, COPD, emphysema) AND [2] WORSE than normal  Answer Assessment - Initial Assessment Questions 1. RESPIRATORY STATUS: "Describe your breathing?" (e.g., wheezing, shortness of breath, unable to speak, severe coughing)      Feeling like can't catch a breath, feels in upper throat 2. ONSET: "When did this breathing problem begin?"      A week 3. PATTERN "Does the difficult breathing come and go, or has it been constant since it started?"      Constant  4. SEVERITY: "How bad is your breathing?" (e.g., mild, moderate, severe)    - MILD: No SOB at rest, mild SOB with walking, speaks normally in sentences, can lie down, no retractions, pulse < 100.    - MODERATE: SOB at rest, SOB with minimal exertion and prefers to sit, cannot lie down flat,  speaks in phrases, mild retractions, audible wheezing, pulse 100-120.    - SEVERE: Very SOB at rest, speaks in single words, struggling to breathe, sitting hunched forward, retractions, pulse > 120      Moderate 5. RECURRENT SYMPTOM: "Have you had difficulty breathing before?" If Yes, ask: "When was the last time?" and "What happened that time?"      "If I have, it's been a long time" 6. CARDIAC HISTORY: "Do you have any history of heart disease?" (e.g., heart attack, angina, bypass surgery, angioplasty)      No 7. LUNG HISTORY: "Do you have any history of lung disease?"  (e.g., pulmonary embolus, asthma, emphysema)     COPD 8. CAUSE: "What do you think is causing the breathing problem?"      Flu mixture with COPD 9. OTHER SYMPTOMS: "Do you have any other symptoms? (e.g., dizziness, runny nose, cough, chest pain, fever)     Cough - productive (green/yellow) 10. O2 SATURATION MONITOR:  "Do you use an oxygen saturation monitor (pulse oximeter) at home?" If Yes, ask: "What is your reading (oxygen level) today?" "What is your usual oxygen saturation reading?" (e.g., 95%)       95  Protocols used: Breathing Difficulty-A-AH

## 2023-08-07 ENCOUNTER — Encounter: Payer: Self-pay | Admitting: Family

## 2023-08-09 ENCOUNTER — Other Ambulatory Visit: Payer: Self-pay | Admitting: Family Medicine

## 2023-08-09 DIAGNOSIS — J301 Allergic rhinitis due to pollen: Secondary | ICD-10-CM

## 2023-09-23 ENCOUNTER — Ambulatory Visit
Admission: RE | Admit: 2023-09-23 | Discharge: 2023-09-23 | Disposition: A | Discharge status: Home / Self Care | Payer: Medicare Other | Source: Ambulatory Visit | Attending: Adult Health | Admitting: Adult Health

## 2023-09-23 DIAGNOSIS — R911 Solitary pulmonary nodule: Secondary | ICD-10-CM

## 2023-09-23 DIAGNOSIS — J439 Emphysema, unspecified: Secondary | ICD-10-CM | POA: Diagnosis not present

## 2023-09-29 ENCOUNTER — Encounter (HOSPITAL_COMMUNITY): Payer: Self-pay | Admitting: Gastroenterology

## 2023-09-29 NOTE — Progress Notes (Signed)
 Attempted to obtain medical history for pre op call via telephone, unable to reach at this time. HIPAA compliant voicemail message left requesting return call to pre surgical testing department.

## 2023-10-02 NOTE — Anesthesia Preprocedure Evaluation (Signed)
 Anesthesia Evaluation  Patient identified by MRN, date of birth, ID band Patient awake    Reviewed: Allergy & Precautions, NPO status , Patient's Chart, lab work & pertinent test results, reviewed documented beta blocker date and time   History of Anesthesia Complications Negative for: history of anesthetic complications  Airway Mallampati: II  TM Distance: >3 FB Neck ROM: Full    Dental  (+) Dental Advisory Given, Implants   Pulmonary COPD,  COPD inhaler, former smoker  Tracheostomy x 3 mo, s/p decannulation 2023 Lung cancer     Pulmonary exam normal        Cardiovascular hypertension, Pt. on home beta blockers and Pt. on medications +CHF  Normal cardiovascular exam     Neuro/Psych  PSYCHIATRIC DISORDERS Anxiety     negative neurological ROS     GI/Hepatic Neg liver ROS,GERD  Medicated and Controlled,,  Endo/Other  negative endocrine ROS    Renal/GU negative Renal ROS    Interstitial cystitis     Musculoskeletal negative musculoskeletal ROS (+)    Abdominal   Peds  Hematology negative hematology ROS (+)   Anesthesia Other Findings   Reproductive/Obstetrics                             Anesthesia Physical Anesthesia Plan  ASA: 4  Anesthesia Plan: MAC   Post-op Pain Management:    Induction:   PONV Risk Score and Plan: 2 and Propofol  infusion and Treatment may vary due to age or medical condition  Airway Management Planned: Natural Airway and Nasal Cannula  Additional Equipment: None  Intra-op Plan:   Post-operative Plan:   Informed Consent: I have reviewed the patients History and Physical, chart, labs and discussed the procedure including the risks, benefits and alternatives for the proposed anesthesia with the patient or authorized representative who has indicated his/her understanding and acceptance.       Plan Discussed with: CRNA and  Anesthesiologist  Anesthesia Plan Comments:        Anesthesia Quick Evaluation

## 2023-10-03 ENCOUNTER — Ambulatory Visit (HOSPITAL_BASED_OUTPATIENT_CLINIC_OR_DEPARTMENT_OTHER): Payer: Self-pay | Admitting: Anesthesiology

## 2023-10-03 ENCOUNTER — Encounter (HOSPITAL_COMMUNITY): Payer: Self-pay | Admitting: Gastroenterology

## 2023-10-03 ENCOUNTER — Other Ambulatory Visit: Payer: Self-pay

## 2023-10-03 ENCOUNTER — Encounter (HOSPITAL_COMMUNITY)
Admission: RE | Disposition: A | Discharge status: Home / Self Care | Payer: Self-pay | Source: Home / Self Care | Attending: Gastroenterology

## 2023-10-03 ENCOUNTER — Ambulatory Visit (HOSPITAL_COMMUNITY): Payer: Self-pay | Admitting: Anesthesiology

## 2023-10-03 ENCOUNTER — Ambulatory Visit (HOSPITAL_COMMUNITY)
Admission: RE | Admit: 2023-10-03 | Discharge: 2023-10-03 | Disposition: A | Discharge status: Home / Self Care | Payer: Medicare Other | Attending: Gastroenterology | Admitting: Gastroenterology

## 2023-10-03 DIAGNOSIS — D127 Benign neoplasm of rectosigmoid junction: Secondary | ICD-10-CM | POA: Diagnosis not present

## 2023-10-03 DIAGNOSIS — J449 Chronic obstructive pulmonary disease, unspecified: Secondary | ICD-10-CM | POA: Insufficient documentation

## 2023-10-03 DIAGNOSIS — R1310 Dysphagia, unspecified: Secondary | ICD-10-CM | POA: Diagnosis not present

## 2023-10-03 DIAGNOSIS — K222 Esophageal obstruction: Secondary | ICD-10-CM | POA: Diagnosis not present

## 2023-10-03 DIAGNOSIS — I509 Heart failure, unspecified: Secondary | ICD-10-CM | POA: Diagnosis not present

## 2023-10-03 DIAGNOSIS — R197 Diarrhea, unspecified: Secondary | ICD-10-CM | POA: Diagnosis not present

## 2023-10-03 DIAGNOSIS — Z139 Encounter for screening, unspecified: Secondary | ICD-10-CM | POA: Diagnosis not present

## 2023-10-03 DIAGNOSIS — Z85118 Personal history of other malignant neoplasm of bronchus and lung: Secondary | ICD-10-CM | POA: Insufficient documentation

## 2023-10-03 DIAGNOSIS — I10 Essential (primary) hypertension: Secondary | ICD-10-CM | POA: Diagnosis not present

## 2023-10-03 DIAGNOSIS — K635 Polyp of colon: Secondary | ICD-10-CM | POA: Diagnosis not present

## 2023-10-03 DIAGNOSIS — K52832 Lymphocytic colitis: Secondary | ICD-10-CM | POA: Insufficient documentation

## 2023-10-03 DIAGNOSIS — K529 Noninfective gastroenteritis and colitis, unspecified: Secondary | ICD-10-CM

## 2023-10-03 DIAGNOSIS — Z8249 Family history of ischemic heart disease and other diseases of the circulatory system: Secondary | ICD-10-CM | POA: Insufficient documentation

## 2023-10-03 DIAGNOSIS — K219 Gastro-esophageal reflux disease without esophagitis: Secondary | ICD-10-CM | POA: Diagnosis not present

## 2023-10-03 DIAGNOSIS — Z87891 Personal history of nicotine dependence: Secondary | ICD-10-CM | POA: Diagnosis not present

## 2023-10-03 DIAGNOSIS — I110 Hypertensive heart disease with heart failure: Secondary | ICD-10-CM | POA: Insufficient documentation

## 2023-10-03 DIAGNOSIS — N3010 Interstitial cystitis (chronic) without hematuria: Secondary | ICD-10-CM | POA: Diagnosis not present

## 2023-10-03 HISTORY — PX: COLONOSCOPY WITH PROPOFOL: SHX5780

## 2023-10-03 HISTORY — PX: ESOPHAGOGASTRODUODENOSCOPY (EGD) WITH PROPOFOL: SHX5813

## 2023-10-03 SURGERY — ESOPHAGOGASTRODUODENOSCOPY (EGD) WITH PROPOFOL
Anesthesia: Monitor Anesthesia Care

## 2023-10-03 MED ORDER — ONDANSETRON HCL 4 MG/2ML IJ SOLN
INTRAMUSCULAR | Status: DC | PRN
  Administered 2023-10-03: 4 mg via INTRAVENOUS

## 2023-10-03 MED ORDER — EPHEDRINE SULFATE (PRESSORS) 50 MG/ML IJ SOLN
INTRAMUSCULAR | Status: DC | PRN
  Administered 2023-10-03: 5 mg via INTRAVENOUS

## 2023-10-03 MED ORDER — SODIUM CHLORIDE 0.9 % IV SOLN
INTRAVENOUS | Status: AC | PRN
  Administered 2023-10-03: 500 mL via INTRAVENOUS

## 2023-10-03 MED ORDER — PROPOFOL 500 MG/50ML IV EMUL
INTRAVENOUS | Status: DC | PRN
  Administered 2023-10-03: 60 mg via INTRAVENOUS
  Administered 2023-10-03: 125 ug/kg/min via INTRAVENOUS

## 2023-10-03 MED ORDER — LIDOCAINE HCL (CARDIAC) PF 100 MG/5ML IV SOSY
PREFILLED_SYRINGE | INTRAVENOUS | Status: DC | PRN
  Administered 2023-10-03: 50 mg via INTRAVENOUS

## 2023-10-03 SURGICAL SUPPLY — 20 items
BLOCK BITE 60FR ADLT L/F BLUE (MISCELLANEOUS) ×1 IMPLANT
ELECTRODE REM PT RTRN 9FT ADLT (ELECTROSURGICAL) IMPLANT
FORCEP RJ3 GP 1.8X160 W-NEEDLE (CUTTING FORCEPS) IMPLANT
FORCEPS BIOP RAD 4 LRG CAP 4 (CUTTING FORCEPS) IMPLANT
FORCEPS BXJMBJMB 240X2.8X (CUTTING FORCEPS) IMPLANT
INJECTOR/SNARE I SNARE (MISCELLANEOUS) IMPLANT
LUBRICANT JELLY 4.5OZ STERILE (MISCELLANEOUS) IMPLANT
MANIFOLD NEPTUNE II (INSTRUMENTS) IMPLANT
NDL SCLEROTHERAPY 25GX240 (NEEDLE) IMPLANT
NEEDLE SCLEROTHERAPY 25GX240 (NEEDLE) IMPLANT
PAD FLOOR 36X40 (MISCELLANEOUS) ×1 IMPLANT
PROBE APC STR FIRE (PROBE) IMPLANT
PROBE INJECTION GOLD 7FR (MISCELLANEOUS) IMPLANT
SNARE ROTATE MED OVAL 20MM (MISCELLANEOUS) IMPLANT
SNARE SHORT THROW 13M SML OVAL (MISCELLANEOUS) IMPLANT
SYR 50ML LL SCALE MARK (SYRINGE) IMPLANT
TRAP SPECIMEN MUCOUS 40CC (MISCELLANEOUS) IMPLANT
TUBING ENDO SMARTCAP PENTAX (MISCELLANEOUS) ×2 IMPLANT
TUBING IRRIGATION ENDOGATOR (MISCELLANEOUS) ×1 IMPLANT
WATER STERILE IRR 1000ML POUR (IV SOLUTION) IMPLANT

## 2023-10-03 NOTE — Discharge Instructions (Signed)

## 2023-10-03 NOTE — Anesthesia Postprocedure Evaluation (Signed)
 Anesthesia Post Note  Patient: [name]  Procedure(s) Performed: ESOPHAGOGASTRODUODENOSCOPY (EGD) WITH PROPOFOL  COLONOSCOPY WITH PROPOFOL      Patient location during evaluation: PACU Anesthesia Type: MAC Level of consciousness: awake and alert Pain management: pain level controlled Vital Signs Assessment: post-procedure vital signs reviewed and stable Respiratory status: spontaneous breathing, nonlabored ventilation and respiratory function stable Cardiovascular status: stable and blood pressure returned to baseline Anesthetic complications: no  No notable events documented.  Last Vitals:  Vitals:   10/03/23 0910 10/03/23 0916  BP: (!) 107/54 (!) 104/52  Pulse: 63 61  Resp: (!) 23 17  Temp:    SpO2: 100% 99%    Last Pain:  Vitals:   10/03/23 0910  TempSrc:   PainSc: 0-No pain                 Juventino Oppenheim

## 2023-10-03 NOTE — Op Note (Signed)
 [facility] Patient Name: [name] Procedure Date: 10/03/2023 MRN: [id number] Attending MD: Alvis Jourdain , MD, [id number] Date of Birth: August 06, 1955 CSN: [id number] Age: 68 Admit Type: Outpatient Procedure:                Upper GI endoscopy Indications:              Dysphagia Providers:                Alvis Jourdain, MD, Suzann Ernst, RN, Rinda Cheers,                            Technician Referring MD:             Alvis Jourdain, MD Medicines:                 Complications:            No immediate complications. Estimated Blood Loss:     Estimated blood loss was minimal. Procedure:                Pre-Anesthesia Assessment:                           - Prior to the procedure, a History and Physical                            was performed, and patient medications and                            allergies were reviewed. The patient's tolerance of                            previous anesthesia was also reviewed. The risks                            and benefits of the procedure and the sedation                            options and risks were discussed with the patient.                            All questions were answered, and informed consent                            was obtained. Prior Anticoagulants: The patient has                            taken no anticoagulant or antiplatelet agents. ASA                            Grade Assessment: III - A patient with severe                            systemic disease. After reviewing the risks and  benefits, the patient was deemed in satisfactory                            condition to undergo the procedure.                           - Sedation was administered by an anesthesia                            professional. Deep sedation was attained.                           After obtaining informed consent, the endoscope was                            passed under direct vision. Throughout the                             procedure, the patient's blood pressure, pulse, and                            oxygen saturations were monitored continuously. The                            [id number]-[id number] ([id number]) Olympus endoscope was introduced                            through the mouth, and advanced to the second part                            of duodenum. The upper GI endoscopy was                            accomplished without difficulty. The patient                            tolerated the procedure well. Scope In: Scope Out: Findings:      One benign-appearing, intrinsic mild stenosis was found at the       cricopharyngeus. This stenosis measured 1.6 cm (inner diameter) x 1 cm       (in length). The stenosis was traversed. A TTS dilator was passed       through the scope. Dilation with a 15-16.5-18 mm balloon dilator was       performed to 18 mm.      The entire examined stomach was normal.      The examined duodenum was normal.      An initial attempt with the Savary 18 mm dilator was performed, but       abandoned. There was some resistance passing the dilated while in her       small oral cavity. A balloon pull-through technique was used starting in       the distal esophagus. The balloon moved freely up to the UES. The       balloon was also pushed down the esophagus and there was no resistance.       At the  UES the balloon was deflated and then reinflated up to 18 mm. A       reinspection of the UES showed that there was a good mucosal break       without immediate complications. Impression:               - Benign-appearing esophageal stenosis. Dilated.                           - Normal stomach.                           - Normal examined duodenum.                           - No specimens collected. Moderate Sedation:      Not Applicable - Patient had care per Anesthesia. Recommendation:           - Patient has a contact number available for                            emergencies. The  signs and symptoms of potential                            delayed complications were discussed with the                            patient. Return to normal activities tomorrow.                            Written discharge instructions were provided to the                            patient.                           - Resume previous diet.                           - Continue present medications.                           - Follow up with Dr. Tova Fresh in 1 month. Procedure Code(s):        --- Professional ---                           [phone], Esophagogastroduodenoscopy, flexible,                            transoral; with transendoscopic balloon dilation of                            esophagus (less than 30 mm diameter) Diagnosis Code(s):        --- Professional ---                           K22.2, Esophageal obstruction  R13.10, Dysphagia, unspecified CPT copyright 2022 American Medical Association. All rights reserved. The codes documented in this report are preliminary and upon coder review may  be revised to meet current compliance requirements. Alvis Jourdain, MD Alvis Jourdain, MD 10/03/2023 8:56:19 AM This report has been signed electronically. Number of Addenda: 0

## 2023-10-03 NOTE — H&P (Signed)
 [name] HPI: This 68 year old white female presents to the office for colorectal cancer screening. She has had a change in her bowel habits. She has been having diarrhea started in July 2023 after she was hospitalized for pneumonia. She reports she was in [facility] for 3 months [09/25/22-12/15/22] ; she was on ventilator followed by tracheostomy for 2 months. She has 2 mucoid BM's a day when she has the diarrhea. She has occasionally takes Imodium at night that gives her some relief. She has small pencil thin BM's that started 4 months ago. She has a history of constipation in the past and can go 4 days without having a BM. At her baseline, she averages 1 BM a day. There is no obvious blood in the stool. She has periumbilical pain that started 3-4 months ago. The pain is usually post prandially. She has nausea without vomiting. She has had recurrent UTI for which she takes Ciprofloxacin. The nausea is usually when she takes antibiotics She has a good appetite and her weight has been stable. She has occasional acid reflux. She has difficulty swallowing meats and has had 4 episodes when she had the sensation of "the meat stuck in her throat" She also has difficulty swallowing potassium pills as well. She denies having any complaints of odynophagia. She denies having a family history of colon cancer, celiac sprue or IBD. Her last colonoscopy was done on 05/13/2007 which revealed internal hemorrhoids, patchy loss of vascular markings throughout the colon with pus exuding from the appendiceal orifice. The appendiceal biopsies revealed focal active colitis and the random biopsies revealed focal active colitis with focal erosions most consistent with acute infectious-type colitis. She is under a lot of stress; her husband was diagnosed with metastatic lung cancer in April 2023.  Past Medical History:  Diagnosis Date   Anxiety    Chronic back pain    COPD (chronic obstructive pulmonary disease) (HCC)     Interstitial cystitis    Lung cancer (HCC)    12/24   Tobacco abuse    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    Past Surgical History:  Procedure Laterality Date   ABDOMINAL HYSTERECTOMY     BACK SURGERY     ILEO LOOP NEOBLADDER     VESICOVAGINAL FISTULA REPAIR      Family History  Problem Relation Age of Onset   Cancer Mother    Heart disease Father    Atrial fibrillation Sister    Heart disease Maternal Grandmother     Social History:  reports that she quit smoking about 4 years ago. Her smoking use included cigarettes. She started smoking about 49 years ago. She has a 90 pack-year smoking history. She has never used smokeless tobacco. She reports that she does not drink alcohol and does not use drugs.  Allergies: No Known Allergies  Medications: Scheduled: Continuous:  sodium chloride  500 mL (10/03/23 0723)    No results found for this or any previous visit (from the past 24 hours).   No results found.  ROS:  As stated above in the HPI otherwise negative.  Blood pressure (!) 123/51, pulse 74, temperature (!) 97.2 F (36.2 C), temperature source Temporal, resp. rate (!) 25, height 5\' 1"  (1.549 m), weight 52.2 kg, SpO2 98%.    PE: Gen: NAD, Alert and Oriented HEENT:  [place]/AT, EOMI Neck: Supple, no LAD Lungs: CTA Bilaterally CV: RRR without M/G/R ABD: Soft, NTND, +BS Ext: No C/C/E  Assessment/Plan: 1) Dysphagia - EGD with  dilation. 2) Diarrhea - colonoscopy with random biopsies.  [name] 10/03/2023, 7:27 AM

## 2023-10-03 NOTE — Transfer of Care (Signed)
 Immediate Anesthesia Transfer of Care Note  Patient: [name]  Procedure(s) Performed: Procedure(s): ESOPHAGOGASTRODUODENOSCOPY (EGD) WITH PROPOFOL  (N/A) COLONOSCOPY WITH PROPOFOL  (N/A)  Patient Location: PACU  Anesthesia Type:MAC  Level of Consciousness:  sedated, patient cooperative and responds to stimulation  Airway & Oxygen Therapy:Patient Spontanous Breathing and Patient connected to face mask oxgen  Post-op Assessment:  Report given to PACU RN and Post -op Vital signs reviewed and stable  Post vital signs:  Reviewed and stable  Last Vitals:  Vitals:   10/03/23 0712 10/03/23 0850  BP: (!) 123/51 (!) 93/49  Pulse: 74 62  Resp: (!) 25 17  Temp: (!) 36.2 C 36.7 C  SpO2: 98% 100%    Complications: No apparent anesthesia complications

## 2023-10-03 NOTE — Op Note (Signed)
 [facility] Patient Name: [name] Procedure Date: 10/03/2023 MRN: [id number] Attending MD: Alvis Jourdain , MD, [id number] Date of Birth: 1955/12/23 CSN: [id number] Age: 68 Admit Type: Outpatient Procedure:                Colonoscopy Indications:              Chronic diarrhea Providers:                Alvis Jourdain, MD, Suzann Ernst, RN, Rinda Cheers,                            Technician Referring MD:             Alvis Jourdain, MD Medicines:                Propofol  per Anesthesia Complications:            No immediate complications. Estimated Blood Loss:     Estimated blood loss: none. Procedure:                Pre-Anesthesia Assessment:                           - Prior to the procedure, a History and Physical                            was performed, and patient medications and                            allergies were reviewed. The patient's tolerance of                            previous anesthesia was also reviewed. The risks                            and benefits of the procedure and the sedation                            options and risks were discussed with the patient.                            All questions were answered, and informed consent                            was obtained. Prior Anticoagulants: The patient has                            taken no anticoagulant or antiplatelet agents. ASA                            Grade Assessment: III - A patient with severe                            systemic disease. After reviewing the risks and  benefits, the patient was deemed in satisfactory                            condition to undergo the procedure.                           - Sedation was administered by an anesthesia                            professional. Deep sedation was attained.                           After obtaining informed consent, the colonoscope                            was passed under direct vision.  Throughout the                            procedure, the patient's blood pressure, pulse, and                            oxygen saturations were monitored continuously. The                            [id number]-HQ190L ([id number]) Olympus colonoscope was                            introduced through the anus and advanced to the the                            cecum, identified by appendiceal orifice and                            ileocecal valve. The colonoscopy was performed                            without difficulty. The patient tolerated the                            procedure well. The quality of the bowel                            preparation was evaluated using the BBPS [facility]                            Bowel Preparation Scale) with scores of: Right                            Colon = 3 (entire mucosa seen well with no residual                            staining, small fragments of stool or opaque  liquid), Transverse Colon = 2 (minor amount of                            residual staining, small fragments of stool and/or                            opaque liquid, but mucosa seen well) and Left Colon                            = 2 (minor amount of residual staining, small                            fragments of stool and/or opaque liquid, but mucosa                            seen well). The total BBPS score equals 7. The                            quality of the bowel preparation was good. The                            ileocecal valve, appendiceal orifice, and rectum                            were photographed. Scope In: 8:24:48 AM Scope Out: 8:43:56 AM Scope Withdrawal Time: 0 hours 12 minutes 6 seconds  Total Procedure Duration: 0 hours 19 minutes 8 seconds  Findings:      An 8 mm polyp was found in the recto-sigmoid colon. The polyp was       sessile. The polyp was removed with a cold snare. Resection and       retrieval were complete.      Normal mucosa  was found in the entire colon. Biopsies for histology were       taken with a cold forceps from the ascending colon and descending colon       for evaluation of microscopic colitis. Impression:               - One 8 mm polyp at the recto-sigmoid colon,                            removed with a cold snare. Resected and retrieved.                           - Normal mucosa in the entire examined colon.                            Biopsied. Moderate Sedation:      Not Applicable - Patient had care per Anesthesia. Recommendation:           - Patient has a contact number available for                            emergencies. The signs and symptoms of potential  delayed complications were discussed with the                            patient. Return to normal activities tomorrow.                            Written discharge instructions were provided to the                            patient.                           - Resume previous diet.                           - Continue present medications.                           - Await pathology results.                           - Repeat colonoscopy in 7 years for surveillance.                           - Follow up with Dr. Tova Fresh in 1 month. Procedure Code(s):        --- Professional ---                           [phone], Colonoscopy, flexible; with removal of                            tumor(s), polyp(s), or other lesion(s) by snare                            technique                           45380, 59, Colonoscopy, flexible; with biopsy,                            single or multiple Diagnosis Code(s):        --- Professional ---                           D12.7, Benign neoplasm of rectosigmoid junction                           K52.9, Noninfective gastroenteritis and colitis,                            unspecified CPT copyright 2022 American Medical Association. All rights reserved. The codes documented in this report are  preliminary and upon coder review may  be revised to meet current compliance requirements. Alvis Jourdain, MD Alvis Jourdain, MD 10/03/2023 8:58:41 AM This report has been signed electronically. Number of Addenda: 0

## 2023-10-06 ENCOUNTER — Encounter (HOSPITAL_COMMUNITY): Payer: Self-pay | Admitting: Gastroenterology

## 2023-10-06 LAB — SURGICAL PATHOLOGY

## 2023-10-10 ENCOUNTER — Other Ambulatory Visit: Payer: Self-pay | Admitting: Family Medicine

## 2023-10-13 ENCOUNTER — Other Ambulatory Visit: Payer: Self-pay | Admitting: Family Medicine

## 2023-10-13 DIAGNOSIS — I5023 Acute on chronic systolic (congestive) heart failure: Secondary | ICD-10-CM

## 2023-10-13 DIAGNOSIS — E785 Hyperlipidemia, unspecified: Secondary | ICD-10-CM

## 2023-10-13 DIAGNOSIS — K219 Gastro-esophageal reflux disease without esophagitis: Secondary | ICD-10-CM

## 2023-10-13 NOTE — Telephone Encounter (Signed)
 Requested medication (s) are due for refill today:   Provider to review  Requested medication (s) are on the active medication list:   Yes  Future visit scheduled:   Yes  11/3 with Dr. Cheril Cork   Last ordered: 06/09/2023 #60, 1 refill  Non delegated refill     Requested Prescriptions  Pending Prescriptions Disp Refills   ALPRAZolam  (XANAX ) 1 MG tablet [Pharmacy Med Name: ALPRAZOLAM  1MG  TABLETS] 60 tablet     Sig: TAKE 1 TABLET(1 MG) BY MOUTH TWICE DAILY     Not Delegated - Psychiatry: Anxiolytics/Hypnotics 2 Failed - 10/13/2023  2:43 PM      Failed - This refill cannot be delegated      Failed - Urine Drug Screen completed in last 360 days      Failed - Valid encounter within last 6 months    Recent Outpatient Visits           2 months ago COPD, severe (HCC)   [facility] Amadeo June, MD   6 months ago Needs flu shot   [facility] Pickard, Cisco Crest, MD       Future Appointments             In 1 month Avanell Bob, Delene Feinstein, MD [facility], [place] [facility]   In 6 months Pickard, Cisco Crest, MD [facility], [facility]            Passed - Patient is not pregnant

## 2023-10-14 NOTE — Telephone Encounter (Signed)
 OV 04/10/23 Requested Prescriptions  Pending Prescriptions Disp Refills   pantoprazole  (PROTONIX ) 40 MG tablet [Pharmacy Med Name: PANTOPRAZOLE  40MG  TABLETS] 90 tablet 1    Sig: TAKE 1 TABLET(40 MG) BY MOUTH DAILY     Gastroenterology: Proton Pump Inhibitors Failed - 10/14/2023  4:19 PM      Failed - Valid encounter within last 12 months    Recent Outpatient Visits           2 months ago COPD, severe (HCC)   [facility] [facility] Amadeo June, MD   6 months ago Needs flu shot   [facility] [facility] Pickard, Cisco Crest, MD       Future Appointments             In 1 month Elmyra Haggard, MD [place] [facility], [place] [facility]   In 6 months Pickard, Cisco Crest, MD [facility], [facility]             spironolactone  (ALDACTONE ) 25 MG tablet [Pharmacy Med Name: SPIRONOLACTONE  25MG  TABLETS] 90 tablet 1    Sig: TAKE 1 TABLET(25 MG) BY MOUTH DAILY     Cardiovascular: Diuretics - Aldosterone Antagonist Failed - 10/14/2023  4:19 PM      Failed - K in normal range and within 180 days    Potassium  Date Value Ref Range Status  07/30/2023 2.9 (L) 3.5 - 5.1 mmol/L Final         Failed - Valid encounter within last 6 months    Recent Outpatient Visits           2 months ago COPD, severe (HCC)   [place] [facility] Amadeo June, MD   6 months ago Needs flu shot   [place] [facility] Pickard, Cisco Crest, MD       Future Appointments             In 1 month Elmyra Haggard, MD [place] [facility]   In 6 months Pickard, Cisco Crest, MD [facility], [facility]            Passed - Cr in normal range and within 180 days    Creatinine, Ser  Date Value Ref Range Status  07/30/2023 1.00 0.44 - 1.00 mg/dL Final         Passed - Na in normal range and within 180 days    Sodium  Date Value Ref Range Status  07/30/2023  137 135 - 145 mmol/L Final         Passed - eGFR is 30 or above and within 180 days    GFR calc Af Amer  Date Value Ref Range Status  05/14/2019 >60 >60 mL/min Final   GFR, Estimated  Date Value Ref Range Status  07/30/2023 >60 >60 mL/min Final    Comment:    (NOTE) Calculated using the CKD-EPI Creatinine Equation (2021)          Passed - Last BP in normal range    BP Readings from Last 1 Encounters:  10/03/23 (!) 104/52          rosuvastatin  (CRESTOR ) 20 MG tablet [Pharmacy Med Name: ROSUVASTATIN  20MG  TABLETS] 90 tablet 1    Sig: TAKE 1 TABLET(20 MG) BY MOUTH DAILY     Cardiovascular:  Antilipid - Statins 2 Failed - 10/14/2023  4:19 PM  Failed - Valid encounter within last 12 months    Recent Outpatient Visits           2 months ago COPD, severe (HCC)   [place] [facility] Amadeo June, MD   6 months ago Needs flu shot   [place] [facility] Pickard, Cisco Crest, MD       Future Appointments             In 1 month Elmyra Haggard, MD [facility], [place] [facility]   In 6 months Pickard, Cisco Crest, MD [facility], [facility]            Failed - Lipid Panel in normal range within the last 12 months    Cholesterol  Date Value Ref Range Status  10/16/2021 81 0 - 200 mg/dL Final   LDL Cholesterol  Date Value Ref Range Status  10/16/2021 23 0 - 99 mg/dL Final    Comment:           Total Cholesterol/HDL:CHD Risk Coronary Heart Disease Risk Table                     Men   Women  1/2 Average Risk   3.4   3.3  Average Risk       5.0   4.4  2 X Average Risk   9.6   7.1  3 X Average Risk  23.4   11.0        Use the calculated Patient Ratio above and the CHD Risk Table to determine the patient's CHD Risk.        ATP III CLASSIFICATION (LDL):  <100     mg/dL   Optimal  409-811  mg/dL   Near or Above                    Optimal  130-159  mg/dL   Borderline  914-782  mg/dL    High  >956     mg/dL   Very High Performed at [facility], [address], [place], [place] [address]    HDL Cholesterol by NMR  Date Value Ref Range Status  03/27/2023 58 >39 mg/dL Final    Comment:    (NOTE) This test was developed and its performance characteristics determined by [facility]. It has not been cleared or approved by the Food and Drug Administration.    Triglycerides by NMR  Date Value Ref Range Status  03/27/2023 110 0 - 149 mg/dL Final    Comment:    (NOTE) This test was developed and its performance characteristics determined by [facility]. It has not been cleared or approved by the Food and Drug Administration.          Passed - Cr in normal range and within 360 days    Creatinine, Ser  Date Value Ref Range Status  07/30/2023 1.00 0.44 - 1.00 mg/dL Final         Passed - Patient is not pregnant

## 2023-10-15 ENCOUNTER — Ambulatory Visit: Payer: Medicare Other | Admitting: Emergency Medicine

## 2023-10-15 ENCOUNTER — Encounter: Payer: Self-pay | Admitting: Emergency Medicine

## 2023-10-15 VITALS — BP 124/68 | HR 73 | Ht 60.0 in | Wt 113.2 lb

## 2023-10-15 DIAGNOSIS — R918 Other nonspecific abnormal finding of lung field: Secondary | ICD-10-CM | POA: Diagnosis not present

## 2023-10-15 DIAGNOSIS — J449 Chronic obstructive pulmonary disease, unspecified: Secondary | ICD-10-CM

## 2023-10-15 NOTE — Progress Notes (Signed)
   Subjective:    Patient ID: [name], female    DOB: 23-Apr-1956, 68 y.o.   MRN: [id number]  HPI   ROV 10/15/2023 --follow-up visit for 68 year old woman with severe COPD, chronic systolic CHF and associated chronic hypoxemic respiratory failure.  She had a tracheostomy in the past after chronic prolonged ICU stay 2023, now decannulated.  She has pulmonary nodular disease that we have followed with CT imaging as well as PET scan October 2024.  She had influenza in February 2025.  She is quite tenuous with her chronic respiratory failure, was referred by me for empiric treatment SBRT by radiation oncology.  She successfully underwent treatment to a left upper lobe nodule that had increased in size and was hypermetabolic on PET. She is on stiolto, is having increased exertional SOB but this is with increased activity.  She follows her SpO2 and has not seen any desats minimal cough. She clears mucous in the am.   CT scan of the chest 09/23/2023 reviewed by me showed severe emphysema left upper lobe anterior linear opacity from the left hilum to nodular component 8 x 6 mm that is unchanged.  Stable 3 mm right middle lobe nodule compared with 02/17/2023.     Review of Systems As per HPI     Objective:   Physical Exam  Vitals:   10/15/23 1456  BP: 124/68  Pulse: 73  SpO2: 99%  Weight: 113 lb 3.2 oz (51.3 kg)  Height: 5' (1.524 m)    Gen: Pleasant, well-nourished, in no distress,  normal affect  ENT: No lesions,  mouth clear,  oropharynx clear, no postnasal drip  Neck: No JVD, no stridor, stoma healed  Lungs: No use of accessory muscles, good air movement, some scattered right-sided crackles, clear on the left  Cardiovascular: RRR, heart sounds normal, no murmur or gallops, no peripheral edema  Musculoskeletal: Wearing bilateral lower extremity braces  Neuro: alert, awake, non focal  Skin: Warm, no lesions or rash      Assessment & Plan:  COPD, severe (HCC) Overall doing  well.  She does have some mucus in the morning that she has to clear.  Minimal cough.  She has exertional shortness of breath but she also has to work hard to care for her husband.  She has not seen any desaturations at home.  If she has progressive shortness of breath we will repeat her walking oximetry to ensure she does not qualify for oxygen.  Continue the Stiolto for now.  We could consider adding Ohtuvayre  at some point in the future.  Keep flu shot up-to-date  Multiple lung nodules on CT Her left upper lobe nodule was treated with SBRT by Dr. Lorri Rota.  Appreciate radiation oncology management.  Will plan for serial imaging to follow the left upper lobe and also a small right middle lobe nodule  We reviewed your CT scan of the chest today.  This is stable compared with your priors.  Good news. We will plan to repeat your CT scan of the chest in October 2025.   Time spent 30 minutes   Racheal Buddle, MD, PhD 10/15/2023, 3:27 PM [facility] [phone] or if no answer before 7:00PM call [phone] For any issues after 7:00PM please call eLink [phone]

## 2023-10-15 NOTE — Assessment & Plan Note (Signed)
 Overall doing well.  She does have some mucus in the morning that she has to clear.  Minimal cough.  She has exertional shortness of breath but she also has to work hard to care for her husband.  She has not seen any desaturations at home.  If she has progressive shortness of breath we will repeat her walking oximetry to ensure she does not qualify for oxygen.  Continue the Stiolto for now.  We could consider adding Ohtuvayre  at some point in the future.  Keep flu shot up-to-date

## 2023-10-15 NOTE — Assessment & Plan Note (Signed)
 Her left upper lobe nodule was treated with SBRT by Dr. Lorri Rota.  Appreciate radiation oncology management.  Will plan for serial imaging to follow the left upper lobe and also a small right middle lobe nodule  We reviewed your CT scan of the chest today.  This is stable compared with your priors.  Good news. We will plan to repeat your CT scan of the chest in October 2025.

## 2023-10-15 NOTE — Patient Instructions (Signed)
 We reviewed your CT scan of the chest today.  This is stable compared with your priors.  Good news. We will plan to repeat your CT scan of the chest in October 2025. Please continue your Stiolto 2 puffs once daily. Keep albuterol  available to use 2 puffs or 1 nebulizer treatment up to every 4 hours if needed for shortness of breath, chest tightness, wheezing. Keep your flu shot up-to-date If you have progressive short windedness we may decide to repeat your walking oximetry test at some point in the future. Follow with Dr. Rindy Kollman in [place] after your CT so we can review those results together

## 2023-11-07 ENCOUNTER — Other Ambulatory Visit: Payer: Self-pay | Admitting: Family Medicine

## 2023-11-07 DIAGNOSIS — J449 Chronic obstructive pulmonary disease, unspecified: Secondary | ICD-10-CM

## 2023-11-08 NOTE — Telephone Encounter (Signed)
 Requested medication (s) are due for refill today: yes  Requested medication (s) are on the active medication list: yes  Last refill:  04/10/23  Future visit scheduled: yes  Notes to clinic:  Unable to refill per protocol, medication not assigned to the refill protocol.      Requested Prescriptions  Pending Prescriptions Disp Refills   STIOLTO RESPIMAT  2.5-2.5 MCG/ACT AERS [Pharmacy Med Name: STIOLTO RESPIMAT  2.5/2.5MCG INH 4GM] 4 g 5    Sig: INHALE 2 PUFFS BY MOUTH DAILY     Off-Protocol Failed - 11/08/2023  3:21 PM      Failed - Medication not assigned to a protocol, review manually.      Failed - Valid encounter within last 12 months    Recent Outpatient Visits           3 months ago COPD, severe (HCC)   [facility] Amadeo June, MD   7 months ago Needs flu shot   [facility] Pickard, Cisco Crest, MD       Future Appointments             In 2 weeks Elmyra Haggard, MD [facility] [place] [facility]   In 5 months Pickard, Cisco Crest, MD [facility], [facility]

## 2023-11-13 ENCOUNTER — Other Ambulatory Visit: Payer: Self-pay | Admitting: Family Medicine

## 2023-11-13 DIAGNOSIS — I5023 Acute on chronic systolic (congestive) heart failure: Secondary | ICD-10-CM

## 2023-11-14 ENCOUNTER — Encounter: Payer: Self-pay | Admitting: Emergency Medicine

## 2023-11-14 NOTE — Telephone Encounter (Signed)
 Last refill has notes- no appointment scheduled.  Requested Prescriptions  Pending Prescriptions Disp Refills   spironolactone  (ALDACTONE ) 25 MG tablet [Pharmacy Med Name: SPIRONOLACTONE  25MG  TABLETS] 30 tablet 0    Sig: TAKE 1 TABLET(25 MG) BY MOUTH DAILY     Cardiovascular: Diuretics - Aldosterone Antagonist Failed - 11/14/2023  9:47 AM      Failed - K in normal range and within 180 days    Potassium  Date Value Ref Range Status  07/30/2023 2.9 (L) 3.5 - 5.1 mmol/L Final         Failed - Valid encounter within last 6 months    Recent Outpatient Visits           3 months ago COPD, severe (HCC)   [facility] Amadeo June, MD   7 months ago Needs flu shot   [facility] Pickard, Cisco Crest, MD       Future Appointments             In 2 weeks Elmyra Haggard, MD [facility] at [facility] [place] [facility]   In 5 months Pickard, Cisco Crest, MD [facility], [facility]            Passed - Cr in normal range and within 180 days    Creatinine, Ser  Date Value Ref Range Status  07/30/2023 1.00 0.44 - 1.00 mg/dL Final         Passed - Na in normal range and within 180 days    Sodium  Date Value Ref Range Status  07/30/2023 137 135 - 145 mmol/L Final         Passed - eGFR is 30 or above and within 180 days    GFR calc Af Amer  Date Value Ref Range Status  05/14/2019 >60 >60 mL/min Final   GFR, Estimated  Date Value Ref Range Status  07/30/2023 >60 >60 mL/min Final    Comment:    (NOTE) Calculated using the CKD-EPI Creatinine Equation (2021)          Passed - Last BP in normal range    BP Readings from Last 1 Encounters:  10/15/23 124/68

## 2023-11-18 ENCOUNTER — Other Ambulatory Visit: Payer: Self-pay

## 2023-11-18 ENCOUNTER — Encounter: Payer: Self-pay | Admitting: Family Medicine

## 2023-11-18 DIAGNOSIS — J449 Chronic obstructive pulmonary disease, unspecified: Secondary | ICD-10-CM

## 2023-11-18 MED ORDER — STIOLTO RESPIMAT 2.5-2.5 MCG/ACT IN AERS: INHALATION_SPRAY | RESPIRATORY_TRACT | 5 refills | Status: AC

## 2023-11-27 NOTE — Progress Notes (Signed)
 Cardiology Office Note   Date:  11/28/2023   ID:  Penny, Mckenzie November 09, 1955, MRN [id number]  PCP:  Penny Butler DASEN, MD  Cardiologist:   Penny Gull, MD   Patient presents for follow of atherosclerosis and SOB  History of Present Illness: [name] is a 68 y.o. female with a history of severe COPD, tobacco abuse, atherosclerosis of aorta on CTscan and hx of LV dysfunction.        2022  Echo showed  LVEF and RVEF were normal    I saw the pt initially for SOB in Aug 2022   No CP   CT showed scattered calcifications of aorta   Recomm medical Rx  PT with severe COPD  2023:  Pt admitted with respiratory failure due to COPD exacerbation in APril 2023   Intubatedd  Echo in April 2023  LVEF 40% with regional wall motion changes  (see below)  Dec 2023  Pt seen in clinic   Still recovering from hospitalization   Had foot drop   Denied CP     Oct 2024  Pt seen in clinic Denied CP  PET scan of chest showed hypermetabolic nodule lung Pt treated by radiation.  She is followed by [name] in pulmonary     Nov 2024 Echo  LVEF and RVEF normal with normal wall motion  Since I saw the pt she notes occsaional CP    She attrib to having to move husband    Infrequent    Her breathing is stable  She is under increased stress as primary caregiver for husband who has metastatic lung CA     Current Meds  Medication Sig   albuterol  (PROVENTIL ) (2.5 MG/3ML) 0.083% nebulizer solution Take 3 mLs (2.5 mg total) by nebulization every 6 (six) hours as needed for wheezing or shortness of breath.   albuterol  (VENTOLIN  HFA) 108 (90 Base) MCG/ACT inhaler INHALE 2 PUFFS INTO THE LUNGS EVERY 6 HOURS AS NEEDED FOR WHEEZE/SHORTNESS OF BREATH   ALPRAZolam  (XANAX ) 1 MG tablet TAKE 1 TABLET(1 MG) BY MOUTH TWICE DAILY   Azelastine -Fluticasone  137-50 MCG/ACT SUSP SPRAY 2 SPRAYS TWICE A DAY BY INTRANASAL ROUTE.   escitalopram  (LEXAPRO ) 10 MG tablet Take 1 tablet (10 mg total) by mouth daily.    loratadine -pseudoephedrine (CLARITIN -D 24 HOUR) 10-240 MG 24 hr tablet Take 1 tablet by mouth daily.   metoprolol  tartrate (LOPRESSOR ) 25 MG tablet Take 0.5 tablets (12.5 mg total) by mouth daily.   montelukast  (SINGULAIR ) 10 MG tablet Take 1 tablet (10 mg total) by mouth daily.   Tiotropium Bromide -Olodaterol (STIOLTO RESPIMAT ) 2.5-2.5 MCG/ACT AERS INHALE 2 PUFFS BY MOUTH INTO THE LUNGS DAILY.   traMADol  (ULTRAM ) 50 MG tablet Take 1 tablet (50 mg total) by mouth every 6 (six) hours as needed.     Allergies:   Patient has no known allergies.   Past Medical History:  Diagnosis Date   Anxiety    Chronic back pain    COPD (chronic obstructive pulmonary disease) (HCC)    Interstitial cystitis    Lung cancer (HCC)    12/24   Tobacco abuse    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    Past Surgical History:  Procedure Laterality Date   ABDOMINAL HYSTERECTOMY     BACK SURGERY     COLONOSCOPY WITH PROPOFOL  N/A 10/03/2023   Procedure: COLONOSCOPY WITH PROPOFOL ;  Surgeon: Rollin Dover, MD;  Location: [facility];  Service: Gastroenterology;  Laterality: N/A;   ESOPHAGOGASTRODUODENOSCOPY (EGD) WITH  PROPOFOL  N/A 10/03/2023   Procedure: ESOPHAGOGASTRODUODENOSCOPY (EGD) WITH PROPOFOL ;  Surgeon: Rollin Dover, MD;  Location: [facility];  Service: Gastroenterology;  Laterality: N/A;   ILEO LOOP NEOBLADDER     VESICOVAGINAL FISTULA REPAIR       Social History:  The patient  reports that she quit smoking about 4 years ago. Her smoking use included cigarettes. She started smoking about 49 years ago. She has a 90 pack-year smoking history. She has never used smokeless tobacco. She reports that she does not drink alcohol and does not use drugs.   Family History:  The patient's family history includes Atrial fibrillation in her sister; Cancer in her mother; Heart disease in her father and maternal grandmother.    ROS:  Please see the history of present illness. All other systems are reviewed and   Negative to the above problem except as noted.    PHYSICAL EXAM: VS:  BP (!) 106/58 (BP Location: Left Arm, Cuff Size: Normal)   Pulse 64   Ht 5' (1.524 m)   Wt 108 lb 9.6 oz (49.3 kg)   SpO2 96%   BMI 21.21 kg/m   GEN: Thin 68 yo in no acute distress  HEENT: normal  Neck: no JVD, carotid bruit Cardiac: RRR; no murmurs,  No LE edema  Respiratory:  Signficant decreased airflow bilaterally No rales or wheezes GI: soft, nontender   No hepatomegaly  MS: no deformity Moving all extremities    EKG:  EKG shows NSR  Incomp RBBB     Echo  Nov 2024   1. Left ventricular ejection fraction, by estimation, is 55 to 60%. The  left ventricle has normal function. The left ventricle demonstrates  regional wall motion abnormalities (see scoring diagram/findings for  description). Left ventricular diastolic  parameters were normal.   2. Right ventricular systolic function is normal. The right ventricular  size is normal. Tricuspid regurgitation signal is inadequate for assessing  PA pressure.   3. The mitral valve is grossly normal. Trivial mitral valve  regurgitation.   4. The aortic valve is tricuspid. Aortic valve regurgitation is not  visualized. No aortic stenosis is present. Aortic valve mean gradient  measures 2.0 mmHg.   5. The inferior vena cava is normal in size with greater than 50%  respiratory variability, suggesting right atrial pressure of 3 mmHg.   Comparison(s): Prior images reviewed side by side. LVEF improved  significantly, now 55-60% range.    Lipid Panel    Component Value Date/Time   CHOL 81 10/16/2021 0327   TRIG 110 03/27/2023 1452   HDL 58 03/27/2023 1452   CHOLHDL 1.8 10/16/2021 0327   VLDL 12 10/16/2021 0327   LDLCALC 23 10/16/2021 0327      Wt Readings from Last 3 Encounters:  11/28/23 108 lb 9.6 oz (49.3 kg)  10/15/23 113 lb 3.2 oz (51.3 kg)  10/03/23 115 lb (52.2 kg)      ASSESSMENT AND PLAN:  1  Hx HFrEF  Pt with LV dysfunciton in 2023  when admitted with respiratory failure    Treated conserviatively given no hx of CP and severe COPD Echo in Nov 2024 LVEF was normal        2  Atherosclerosis   Scattered calcifications on aorta   Rx risk factors  3  HTN   BP is well controlled  4  HL  LDL 80 in Octo 2024  HDL 46  Trig 110   Continue Crestor   Current medicines are reviewed at length with the patient today.  The patient does not have concerns regarding medicines.  Signed, Penny Gull, MD  11/28/2023 3:07 PM    [facility] [address] [place], [place], [place]  [address] Phone: [phone]; Fax: [phone]

## 2023-11-28 ENCOUNTER — Ambulatory Visit: Attending: Internal Medicine | Admitting: Internal Medicine

## 2023-11-28 ENCOUNTER — Encounter: Payer: Self-pay | Admitting: Internal Medicine

## 2023-11-28 VITALS — BP 106/58 | HR 64 | Ht 60.0 in | Wt 108.6 lb

## 2023-11-28 DIAGNOSIS — R0789 Other chest pain: Secondary | ICD-10-CM | POA: Diagnosis not present

## 2023-11-28 NOTE — Patient Instructions (Signed)
 Medication Instructions:  Your physician recommends that you continue on your current medications as directed. Please refer to the Current Medication list given to you today.  *If you need a refill on your cardiac medications before your next appointment, please call your pharmacy*  Lab Work: NONE   If you have labs (blood work) drawn today and your tests are completely normal, you will receive your results only by: MyChart Message (if you have MyChart) OR A paper copy in the mail If you have any lab test that is abnormal or we need to change your treatment, we will call you to review the results.  Testing/Procedures: NONE   Follow-Up: At [facility], you and your health needs are our priority.  As part of our continuing mission to provide you with exceptional heart care, our providers are all part of one team.  This team includes your primary Cardiologist (physician) and Advanced Practice Providers or APPs (Physician Assistants and Nurse Practitioners) who all work together to provide you with the care you need, when you need it.  Your next appointment:   Fall / Winter of 2026/ 2027   Provider:   You may see Ola Berger, MD or one of the following Advanced Practice Providers on your designated Care Team:   Woodfin Hays, PA-C  [place], [place] Theotis Flake, [place]     We recommend signing up for the patient portal called MyChart.  Sign up information is provided on this After Visit Summary.  MyChart is used to connect with patients for Virtual Visits (Telemedicine).  Patients are able to view lab/test results, encounter notes, upcoming appointments, etc.  Non-urgent messages can be sent to your provider as well.   To learn more about what you can do with MyChart, go to [email or web address].   Other Instructions Thank you for choosing [facility]!

## 2023-12-02 DIAGNOSIS — R152 Fecal urgency: Secondary | ICD-10-CM | POA: Diagnosis not present

## 2023-12-02 DIAGNOSIS — K52832 Lymphocytic colitis: Secondary | ICD-10-CM | POA: Diagnosis not present

## 2023-12-02 DIAGNOSIS — R140 Abdominal distension (gaseous): Secondary | ICD-10-CM | POA: Diagnosis not present

## 2023-12-11 ENCOUNTER — Telehealth: Payer: Self-pay

## 2023-12-11 NOTE — Telephone Encounter (Signed)
 Fax received from [facility] for a new prescription for the patient's Tramadol  50 mg.  Last RF was 05/2023. Thanks.

## 2023-12-11 NOTE — Telephone Encounter (Signed)
 Erroneous encounter. Please disregard.

## 2023-12-12 ENCOUNTER — Other Ambulatory Visit: Payer: Self-pay | Admitting: Family Medicine

## 2023-12-12 MED ORDER — TRAMADOL HCL 50 MG PO TABS: 50.0000 mg | ORAL_TABLET | Freq: Four times a day (QID) | ORAL | 0 refills | Status: AC | PRN

## 2024-02-18 ENCOUNTER — Other Ambulatory Visit: Payer: Self-pay | Admitting: Family Medicine

## 2024-03-02 ENCOUNTER — Encounter: Payer: Self-pay | Admitting: Emergency Medicine

## 2024-03-15 ENCOUNTER — Other Ambulatory Visit

## 2024-04-02 ENCOUNTER — Ambulatory Visit
Admission: RE | Admit: 2024-04-02 | Discharge: 2024-04-02 | Disposition: A | Discharge status: Home / Self Care | Source: Ambulatory Visit | Attending: Emergency Medicine | Admitting: Emergency Medicine

## 2024-04-02 DIAGNOSIS — R918 Other nonspecific abnormal finding of lung field: Secondary | ICD-10-CM

## 2024-04-02 DIAGNOSIS — J439 Emphysema, unspecified: Secondary | ICD-10-CM | POA: Diagnosis not present

## 2024-04-07 ENCOUNTER — Other Ambulatory Visit: Payer: Medicare Other

## 2024-04-07 DIAGNOSIS — N179 Acute kidney failure, unspecified: Secondary | ICD-10-CM

## 2024-04-07 DIAGNOSIS — I10 Essential (primary) hypertension: Secondary | ICD-10-CM | POA: Diagnosis not present

## 2024-04-07 DIAGNOSIS — E876 Hypokalemia: Secondary | ICD-10-CM

## 2024-04-07 DIAGNOSIS — J449 Chronic obstructive pulmonary disease, unspecified: Secondary | ICD-10-CM | POA: Diagnosis not present

## 2024-04-07 DIAGNOSIS — R739 Hyperglycemia, unspecified: Secondary | ICD-10-CM

## 2024-04-07 DIAGNOSIS — I5023 Acute on chronic systolic (congestive) heart failure: Secondary | ICD-10-CM

## 2024-04-07 DIAGNOSIS — E785 Hyperlipidemia, unspecified: Secondary | ICD-10-CM

## 2024-04-07 DIAGNOSIS — E870 Hyperosmolality and hypernatremia: Secondary | ICD-10-CM

## 2024-04-08 ENCOUNTER — Ambulatory Visit: Payer: Self-pay | Admitting: Family Medicine

## 2024-04-08 LAB — COMPLETE METABOLIC PANEL WITHOUT GFR
AG Ratio: 1.9 (calc) (ref 1.0–2.5)
ALT: 43 U/L — ABNORMAL HIGH (ref 6–29)
AST: 31 U/L (ref 10–35)
Albumin: 4.3 g/dL (ref 3.6–5.1)
Alkaline phosphatase (APISO): 101 U/L (ref 37–153)
BUN: 15 mg/dL (ref 7–25)
CO2: 32 mmol/L (ref 20–32)
Calcium: 9.2 mg/dL (ref 8.6–10.4)
Chloride: 102 mmol/L (ref 98–110)
Creat: 0.96 mg/dL (ref 0.50–1.05)
Globulin: 2.3 g/dL (ref 1.9–3.7)
Glucose, Bld: 62 mg/dL — ABNORMAL LOW (ref 65–99)
Potassium: 3.8 mmol/L (ref 3.5–5.3)
Sodium: 143 mmol/L (ref 135–146)
Total Bilirubin: 1 mg/dL (ref 0.2–1.2)
Total Protein: 6.6 g/dL (ref 6.1–8.1)

## 2024-04-08 LAB — CBC WITH DIFFERENTIAL/PLATELET
Absolute Lymphocytes: 3311 {cells}/uL (ref 850–3900)
Absolute Monocytes: 712 {cells}/uL (ref 200–950)
Basophils Absolute: 71 {cells}/uL (ref 0–200)
Basophils Relative: 0.8 %
Eosinophils Absolute: 107 {cells}/uL (ref 15–500)
Eosinophils Relative: 1.2 %
HCT: 39 % (ref 35.0–45.0)
Hemoglobin: 13 g/dL (ref 11.7–15.5)
MCH: 32.6 pg (ref 27.0–33.0)
MCHC: 33.3 g/dL (ref 32.0–36.0)
MCV: 97.7 fL (ref 80.0–100.0)
MPV: 12 fL (ref 7.5–12.5)
Monocytes Relative: 8 %
Neutro Abs: 4699 {cells}/uL (ref 1500–7800)
Neutrophils Relative %: 52.8 %
Platelets: 176 Thousand/uL (ref 140–400)
RBC: 3.99 Million/uL (ref 3.80–5.10)
RDW: 13.1 % (ref 11.0–15.0)
Total Lymphocyte: 37.2 %
WBC: 8.9 Thousand/uL (ref 3.8–10.8)

## 2024-04-08 LAB — LIPID PANEL
Cholesterol: 135 mg/dL (ref <200)
HDL: 93 mg/dL (ref >=50)
LDL Cholesterol (Calc): 24 mg/dL
Non-HDL Cholesterol (Calc): 42 mg/dL (ref <130)
Total CHOL/HDL Ratio: 1.5 (calc) (ref <5.0)
Triglycerides: 92 mg/dL (ref <150)

## 2024-04-12 ENCOUNTER — Encounter: Payer: Self-pay | Admitting: Family Medicine

## 2024-04-12 ENCOUNTER — Ambulatory Visit: Payer: Medicare Other | Admitting: Family Medicine

## 2024-04-12 VITALS — BP 120/62 | HR 73 | Temp 98.1°F | Ht 60.0 in | Wt 118.0 lb

## 2024-04-12 DIAGNOSIS — J301 Allergic rhinitis due to pollen: Secondary | ICD-10-CM

## 2024-04-12 DIAGNOSIS — Z780 Asymptomatic menopausal state: Secondary | ICD-10-CM

## 2024-04-12 DIAGNOSIS — Z0001 Encounter for general adult medical examination with abnormal findings: Secondary | ICD-10-CM

## 2024-04-12 DIAGNOSIS — J449 Chronic obstructive pulmonary disease, unspecified: Secondary | ICD-10-CM | POA: Diagnosis not present

## 2024-04-12 DIAGNOSIS — Z0000 Encounter for general adult medical examination without abnormal findings: Secondary | ICD-10-CM

## 2024-04-12 DIAGNOSIS — Z23 Encounter for immunization: Secondary | ICD-10-CM | POA: Diagnosis not present

## 2024-04-12 DIAGNOSIS — E785 Hyperlipidemia, unspecified: Secondary | ICD-10-CM

## 2024-04-12 DIAGNOSIS — K222 Esophageal obstruction: Secondary | ICD-10-CM | POA: Insufficient documentation

## 2024-04-12 DIAGNOSIS — C3412 Malignant neoplasm of upper lobe, left bronchus or lung: Secondary | ICD-10-CM

## 2024-04-12 DIAGNOSIS — K52832 Lymphocytic colitis: Secondary | ICD-10-CM | POA: Insufficient documentation

## 2024-04-12 DIAGNOSIS — Z1231 Encounter for screening mammogram for malignant neoplasm of breast: Secondary | ICD-10-CM

## 2024-04-12 MED ORDER — BUPROPION HCL ER (XL) 150 MG PO TB24: 150.0000 mg | ORAL_TABLET | Freq: Every day | ORAL | 5 refills | Status: AC

## 2024-04-12 MED ORDER — ZOLPIDEM TARTRATE 10 MG PO TABS: 10.0000 mg | ORAL_TABLET | Freq: Every evening | ORAL | 1 refills | Status: DC | PRN

## 2024-04-12 MED ORDER — MONTELUKAST SODIUM 10 MG PO TABS: 10.0000 mg | ORAL_TABLET | Freq: Every day | ORAL | 1 refills | Status: AC

## 2024-04-12 NOTE — Progress Notes (Signed)
 Subjective:    Patient ID: [name], female    DOB: [birth year], 68 y.o.   MRN: [id number]  Depression         Patient is a 68 year old Caucasian female here today for CPE.  She is married to one of my patients who recently passed from metatstatic lung cancer.  She sees pulmonology for COPD.  Currently on Stiolto for COPD.  Also she has a left lung nodule that was treated with SBRT through radiation oncology.  She saw pulmonology this summer and I have copied their assessment and plan below: Her left upper lobe nodule was treated with SBRT by Dr. Patrcia.  Appreciate radiation oncology management.  Will plan for serial imaging to follow the left upper lobe and also a small right middle lobe nodule   We reviewed your CT scan of the chest today.  This is stable compared with your priors.  Good news. We will plan to repeat your CT scan of the chest in October 2025.  She also has a history of severe interstitial cystitis.  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  2023 she was hospitalized for 3 months with respiratory failure secondary to pneumonia.  She has a tracheostomy scar for this.  She has a history of lumbar fusion from L4-S1.  This causes chronic back pain.   She is currently using tramadol  sparingly for low back pain.  Originally was on methadone but she has been able to wean down on this.  She also has a history of an ejection fraction of 40% on echocardiogram with diastolic dysfunction.  Recent echocardiogram showed normal ejection fraction.  Echocardiogram revealing reduced ejection fraction was while she was admitted for respiratory failure from pneumonia.  Currently on low-dose beta-blocker.  Spironolactone  has been discontinued.    Patient had a colonoscopy in April 2025 that revealed  lymphocytic colitis and a tubular adenoma.  Repeat colonoscopy was recommended in 7 years.  Patient is overdue for a mammogram.  She is also due for a bone density test.  She is due for a flu shot, COVID shot, a tetanus shot, as well as the new pneumonia vaccine.  Ever since the patient's husband passed away, she has been having increasing trouble sleeping.  She states that she has tried taking a half of a Xanax  and then a whole Xanax  but no relief.  She is requesting something to try to help her sleep through the night.  Often she lays and tosses and turns thinking about everything that has transpired.  She also reports sadness and feelings of depression.  She reports lack of energy.  She reports increasing appetite.  She feels like she is eating constantly due to sadness.  She denies any suicidal ideation.  She does endorse trouble sleeping as mentioned above Past Medical History:  Diagnosis Date   Anxiety    Chronic back pain    COPD (chronic obstructive pulmonary disease) (HCC)    Interstitial cystitis    Lung cancer (HCC)    12/24   Tobacco abuse    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   Past Surgical History:  Procedure Laterality Date   ABDOMINAL HYSTERECTOMY     BACK SURGERY     COLONOSCOPY WITH PROPOFOL  N/A 10/03/2023   Procedure: COLONOSCOPY WITH PROPOFOL ;  Surgeon: Rollin Dover, MD;  Location: [facility];  Service: Gastroenterology;  Laterality: N/A;   ESOPHAGOGASTRODUODENOSCOPY (EGD) WITH PROPOFOL  N/A 10/03/2023   Procedure: ESOPHAGOGASTRODUODENOSCOPY (EGD) WITH PROPOFOL ;  Surgeon: Rollin Dover, MD;  Location: [facility];  Service: Gastroenterology;  Laterality: N/A;   ILEO LOOP NEOBLADDER     VESICOVAGINAL FISTULA REPAIR     Current Outpatient Medications on File Prior to Visit  Medication Sig Dispense Refill   albuterol  (PROVENTIL ) (2.5 MG/3ML) 0.083% nebulizer solution Take 3 mLs (2.5 mg total) by nebulization every 6 (six) hours as needed for wheezing or shortness of  breath. 75 mL 5   albuterol  (VENTOLIN  HFA) 108 (90 Base) MCG/ACT inhaler INHALE 2 PUFFS INTO THE LUNGS EVERY 6 HOURS AS NEEDED FOR WHEEZE/SHORTNESS OF BREATH 36 each 11   ALPRAZolam  (XANAX ) 1 MG tablet TAKE 1 TABLET(1 MG) BY MOUTH TWICE DAILY 60 tablet 2   Azelastine -Fluticasone  137-50 MCG/ACT SUSP SPRAY 2 SPRAYS TWICE A DAY BY INTRANASAL ROUTE. 23 g G   benzonatate  (TESSALON ) 200 MG capsule Take 1 capsule (200 mg total) by mouth 3 (three) times daily as needed. Swallow whole, do not chew (Patient not taking: Reported on 11/28/2023) 21 capsule 0   budesonide  (ENTOCORT EC ) 3 MG 24 hr capsule Take 3 mg by mouth daily. (Patient not taking: Reported on 11/28/2023)     doxycycline  (VIBRA -TABS) 100 MG tablet Take 1 tablet (100 mg total) by mouth 2 (two) times daily. (Patient not taking: Reported on 11/28/2023) 14 tablet 0   escitalopram  (LEXAPRO ) 10 MG tablet Take 1 tablet (10 mg total) by mouth daily. 90 tablet 1   fluticasone  (FLONASE ) 50 MCG/ACT nasal spray SHAKE LIQUID AND USE 1 SPRAY IN EACH NOSTRIL DAILY AS NEEDED FOR ALLERGIES (Patient not taking: Reported on 11/28/2023) 16 g 1   loratadine -pseudoephedrine (CLARITIN -D 24 HOUR) 10-240 MG 24 hr tablet Take 1 tablet by mouth daily. 90 tablet 1   metoprolol  tartrate (LOPRESSOR ) 25 MG tablet Take 0.5 tablets (12.5 mg total) by mouth daily. 90 tablet 1   montelukast  (SINGULAIR ) 10 MG tablet Take 1 tablet (10 mg total) by mouth daily. 90 tablet 1   Multiple Vitamin (MULTIVITAMIN WITH MINERALS) TABS tablet Take 1 tablet by mouth daily. (Patient not taking: Reported on 11/28/2023)     ondansetron  (ZOFRAN ) 4 MG tablet Take 1 tablet (4 mg total) by mouth every 6 (six) hours. As needed for nausea and vomiting (Patient not taking: Reported on 11/28/2023) 12 tablet 0   pantoprazole  (PROTONIX ) 40 MG tablet TAKE 1 TABLET(40 MG) BY MOUTH DAILY (Patient not taking: Reported on 11/28/2023) 90 tablet 1   potassium chloride  SA (KLOR-CON  M) 20 MEQ tablet Take 1 tablet (20 mEq  total) by mouth 2 (two) times daily. (Patient not taking: Reported on 11/28/2023) 10 tablet 0   rosuvastatin  (CRESTOR ) 20 MG tablet TAKE 1 TABLET(20 MG) BY MOUTH DAILY (Patient not taking: Reported on 11/28/2023) 90 tablet 1   spironolactone  (ALDACTONE ) 25 MG tablet TAKE 1 TABLET(25 MG) BY MOUTH DAILY (Patient not taking: Reported on 11/28/2023) 30 tablet 0   Tiotropium Bromide -Olodaterol (STIOLTO RESPIMAT ) 2.5-2.5 MCG/ACT AERS INHALE 2 PUFFS BY MOUTH INTO THE LUNGS DAILY. 4 g 5   traMADol  (ULTRAM ) 50 MG tablet Take 1 tablet (50 mg total) by mouth every 6 (six) hours as needed. 90 tablet 0   No current facility-administered medications on file prior to visit.   No Known Allergies Social History   Socioeconomic History   Marital status: Married    Spouse name: Not on file  Number of children: Not on file   Years of education: Not on file   Highest education level: Some college, no degree  Occupational History   Not on file  Tobacco Use   Smoking status: Former    Current packs/day: 0.00    Average packs/day: 2.0 packs/day for 45.0 years (90.0 ttl pk-yrs)    Types: Cigarettes    Start date: 04/10/1974    Quit date: 04/11/2019    Years since quitting: 5.0   Smokeless tobacco: Never  Vaping Use   Vaping status: Never Used  Substance and Sexual Activity   Alcohol use: No   Drug use: Never   Sexual activity: Yes  Other Topics Concern   Not on file  Social History Narrative   Not on file   Social Drivers of Health   Financial Resource Strain: Low Risk  (04/06/2023)   Overall Financial Resource Strain (CARDIA)    Difficulty of Paying Living Expenses: Not very hard  Food Insecurity: No Food Insecurity (04/23/2023)   Hunger Vital Sign    Worried About Running Out of Food in the Last Year: Never true    Ran Out of Food in the Last Year: Never true  Transportation Needs: No Transportation Needs (04/23/2023)   PRAPARE - Administrator, Civil Service (Medical): No    Lack  of Transportation (Non-Medical): No  Physical Activity: Unknown (04/06/2023)   Exercise Vital Sign    Days of Exercise per Week: 7 days    Minutes of Exercise per Session: Patient declined  Stress: Stress Concern Present (04/06/2023)   [employer] of Occupational Health - Occupational Stress Questionnaire    Feeling of Stress : Very much  Social Connections: Moderately Isolated (04/06/2023)   Social Connection and Isolation Panel    Frequency of Communication with Friends and Family: Twice a week    Frequency of Social Gatherings with Friends and Family: Once a week    Attends Religious Services: Never    Database Administrator or Organizations: No    Attends Engineer, Structural: Not on file    Marital Status: Married  Catering Manager Violence: Not At Risk (05/06/2023)   Humiliation, Afraid, Rape, and Kick questionnaire    Fear of Current or Ex-Partner: No    Emotionally Abused: No    Physically Abused: No    Sexually Abused: No   Family History  Problem Relation Age of Onset   Cancer Mother    Heart disease Father    Atrial fibrillation Sister    Heart disease Maternal Grandmother      Review of Systems  All other systems reviewed and are negative.      Objective:   Physical Exam Vitals reviewed.  Constitutional:      Appearance: Normal appearance. She is normal weight. She is not toxic-appearing or diaphoretic.  HENT:     Right Ear: Tympanic membrane and ear canal normal.     Left Ear: Tympanic membrane and ear canal normal.  Eyes:     Extraocular Movements: Extraocular movements intact.     Pupils: Pupils are equal, round, and reactive to light.  Cardiovascular:     Rate and Rhythm: Normal rate and regular rhythm.     Heart sounds: Normal heart sounds. No murmur heard.    No friction rub. No gallop.  Pulmonary:     Effort: Pulmonary effort is normal. No respiratory distress.     Breath sounds: Normal breath sounds. No wheezing, rhonchi  or  rales.  Abdominal:     General: Bowel sounds are normal. There is no distension.     Palpations: Abdomen is soft.     Tenderness: There is no abdominal tenderness. There is no guarding.  Musculoskeletal:     Right lower leg: No edema.     Left lower leg: No edema.  Skin:    Findings: No erythema.  Neurological:     General: No focal deficit present.     Mental Status: She is alert and oriented to person, place, and time.     Cranial Nerves: No cranial nerve deficit.     Sensory: No sensory deficit.     Motor: No weakness.     Coordination: Coordination normal.     Gait: Gait normal.  Psychiatric:        Mood and Affect: Mood normal.        Behavior: Behavior normal.        Thought Content: Thought content normal.           Assessment & Plan:  Hyperlipidemia, unspecified hyperlipidemia type  COPD, severe (HCC) - Plan: montelukast  (SINGULAIR ) 10 MG tablet  Allergic rhinitis due to pollen, unspecified seasonality - Plan: montelukast  (SINGULAIR ) 10 MG tablet  Primary cancer of left upper lobe of lung (HCC)  Encounter for Medicare annual wellness exam  Encounter for screening mammogram for malignant neoplasm of breast - Plan: MM 3D SCREENING MAMMOGRAM BILATERAL BREAST  Postmenopausal estrogen deficiency - Plan: DG Bone Density First regarding the patient's nodule in her lung.  CT scan in October shows stability.  Pulmonology is monitoring this every 6 months with CT scan.  Colonoscopy is up-to-date however I will schedule the patient for a mammogram and a bone density test.  She received her flu shot today.  She also received tetanus shot today.  I recommended the new pneumonia vaccine as well as COVID-vaccine.  Her lab work is excellent outside of a mild elevation in her liver function test.  I recommended rechecking her liver function test in 3 months.  We will add Wellbutrin extended release 150 mg daily to Lexapro  to assist with depression.  She also reports increasing  shortness of breath with activity.  She attributes this to COPD.  On her exam today she does have diminished breath sounds throughout with decreased expiratory volume and faint expiratory wheezing.  I recommended trying switching from Stiolto to Breztri  2 puffs twice daily and see if this helps her breathing.  I gave her Ambien 5 mg p.o. nightly as needed insomnia and recommended she avoid taking this with Xanax  or tramadol .

## 2024-04-12 NOTE — Addendum Note (Signed)
 Addended by: ANGELENA RONAL BRADLEY K on: 04/12/2024 04:04 PM   Modules accepted: Orders

## 2024-04-27 DIAGNOSIS — Z48816 Encounter for surgical aftercare following surgery on the genitourinary system: Secondary | ICD-10-CM | POA: Diagnosis not present

## 2024-04-27 DIAGNOSIS — Z9889 Other specified postprocedural states: Secondary | ICD-10-CM | POA: Diagnosis not present

## 2024-04-27 DIAGNOSIS — N2889 Other specified disorders of kidney and ureter: Secondary | ICD-10-CM | POA: Diagnosis not present

## 2024-05-13 ENCOUNTER — Ambulatory Visit

## 2024-05-21 ENCOUNTER — Other Ambulatory Visit: Payer: Self-pay | Admitting: Family Medicine

## 2024-05-27 ENCOUNTER — Ambulatory Visit: Payer: Self-pay | Admitting: Emergency Medicine

## 2024-05-27 DIAGNOSIS — R918 Other nonspecific abnormal finding of lung field: Secondary | ICD-10-CM

## 2024-05-28 NOTE — Progress Notes (Signed)
 Called and spoke with the pt and advised of RB note. Pt verbalized understanding and had no further  concerns. Nothing further needed.  Order placed.

## 2024-06-09 ENCOUNTER — Ambulatory Visit
Admission: RE | Admit: 2024-06-09 | Discharge: 2024-06-09 | Disposition: A | Discharge status: Home / Self Care | Source: Ambulatory Visit | Attending: Family Medicine | Admitting: Family Medicine

## 2024-06-09 DIAGNOSIS — Z1231 Encounter for screening mammogram for malignant neoplasm of breast: Secondary | ICD-10-CM

## 2024-06-16 ENCOUNTER — Other Ambulatory Visit: Payer: Self-pay

## 2024-06-16 ENCOUNTER — Telehealth: Payer: Self-pay

## 2024-06-16 DIAGNOSIS — J449 Chronic obstructive pulmonary disease, unspecified: Secondary | ICD-10-CM

## 2024-06-16 MED ORDER — BREZTRI AEROSPHERE 160-9-4.8 MCG/ACT IN AERO: 2.0000 | INHALATION_SPRAY | Freq: Two times a day (BID) | RESPIRATORY_TRACT | 11 refills | Status: AC

## 2024-06-16 NOTE — Telephone Encounter (Signed)
 Copied from CRM [id number]. Topic: Clinical - Medication Question >> Jun 16, 2024  2:03 PM Wess RAMAN wrote: Reason for CRM: Patient states she is out of Breztri  Aerosphere samples and states it works well. She would like a prescription sent to the pharmacy.  Callback #: [id number]  Pharmacy: [facility] [phone] - [place], [place] - [address] [place] [address] Phone: [phone] Fax: [phone] Hours: Not open 24 hours

## 2024-06-23 ENCOUNTER — Ambulatory Visit

## 2024-06-24 ENCOUNTER — Ambulatory Visit

## 2024-07-07 ENCOUNTER — Other Ambulatory Visit: Payer: Self-pay

## 2024-07-07 ENCOUNTER — Ambulatory Visit

## 2024-07-07 VITALS — Ht 60.0 in | Wt 135.0 lb

## 2024-07-07 DIAGNOSIS — Z0000 Encounter for general adult medical examination without abnormal findings: Secondary | ICD-10-CM | POA: Diagnosis not present

## 2024-07-07 NOTE — Progress Notes (Signed)
 "  Chief Complaint  Patient presents with   Medicare Wellness     Subjective:   [name] is a 69 y.o. female who presents for a Medicare Annual Wellness Visit.  Visit info / Clinical Intake: Medicare Wellness Visit Type:: Subsequent Annual Wellness Visit Persons participating in visit and providing information:: patient Medicare Wellness Visit Mode:: Telephone If telephone:: video declined Since this visit was completed virtually, some vitals may be partially provided or unavailable. Missing vitals are due to the limitations of the virtual format.: Documented vitals are patient reported If Telephone or Video please confirm:: I connected with patient using audio/video enable telemedicine. I verified patient identity with two identifiers, discussed telehealth limitations, and patient agreed to proceed. Patient Location:: home Provider Location:: home office Interpreter Needed?: No Pre-visit prep was completed: yes AWV questionnaire completed by patient prior to visit?: no Living arrangements:: (!) lives alone Patient's Overall Health Status Rating: good Typical amount of pain: some Does pain affect daily life?: no Are you currently prescribed opioids?: no  Dietary Habits and Nutritional Risks How many meals a day?: 3 Eats fruit and vegetables daily?: yes Most meals are obtained by: preparing own meals In the last 2 weeks, have you had any of the following?: unintentional weight gain Diabetic:: no  Functional Status Activities of Daily Living (to include ambulation/medication): Independent Ambulation: Independent Medication Administration: Independent Home Management (perform basic housework or laundry): Independent Manage your own finances?: yes Primary transportation is: driving Concerns about vision?: no *vision screening is required for WTM* Concerns about hearing?: no  Fall Screening Falls in the past year?: 0 Number of falls in past year: 0 Was there an injury  with Fall?: 0 Fall Risk Category Calculator: 0 Patient Fall Risk Level: Low Fall Risk  Fall Risk Patient at Risk for Falls Due to: No Fall Risks Fall risk Follow up: Falls evaluation completed; Falls prevention discussed; Education provided  Home and Transportation Safety: All rugs have non-skid backing?: yes All stairs or steps have railings?: yes Grab bars in the bathtub or shower?: yes Have non-skid surface in bathtub or shower?: yes Good home lighting?: yes Regular seat belt use?: yes Hospital stays in the last year:: no  Cognitive Assessment Difficulty concentrating, remembering, or making decisions? : no Will 6CIT or Mini Cog be Completed: no 6CIT or Mini Cog Declined: patient alert, oriented, able to answer questions appropriately and recall recent events  Advance Directives (For Healthcare) Does Patient Have a Medical Advance Directive?: Yes Does patient want to make changes to medical advance directive?: Yes (MAU/Ambulatory/Procedural Areas - Information given) Type of Advance Directive: Healthcare Power of [place]; Living will Copy of Healthcare Power of Attorney in Chart?: Yes - validated most recent copy scanned in chart (See row information) Copy of Living Will in Chart?: Yes - validated most recent copy scanned in chart (See row information) Would patient like information on creating a medical advance directive?: No - Patient declined  Reviewed/Updated  Reviewed/Updated: Reviewed All (Medical, Surgical, Family, Medications, Allergies, Care Teams, Patient Goals)    Allergies (verified) Patient has no known allergies.   Current Medications (verified) Outpatient Encounter Medications as of 07/07/2024  Medication Sig   albuterol  (PROVENTIL ) (2.5 MG/3ML) 0.083% nebulizer solution Take 3 mLs (2.5 mg total) by nebulization every 6 (six) hours as needed for wheezing or shortness of breath.   albuterol  (VENTOLIN  HFA) 108 (90 Base) MCG/ACT inhaler INHALE 2 PUFFS INTO THE  LUNGS EVERY 6 HOURS AS NEEDED FOR WHEEZE/SHORTNESS OF BREATH  Azelastine -Fluticasone  137-50 MCG/ACT SUSP SPRAY 2 SPRAYS TWICE A DAY BY INTRANASAL ROUTE.   budesonide  (ENTOCORT EC ) 3 MG 24 hr capsule Take 3 mg by mouth daily.   budesonide -glycopyrrolate-formoterol  (BREZTRI  AEROSPHERE) 160-9-4.8 MCG/ACT AERO inhaler Inhale 2 puffs into the lungs 2 (two) times daily.   doxycycline  (VIBRA -TABS) 100 MG tablet Take 1 tablet (100 mg total) by mouth 2 (two) times daily.   escitalopram  (LEXAPRO ) 10 MG tablet Take 1 tablet (10 mg total) by mouth daily.   loratadine -pseudoephedrine (CLARITIN -D 24 HOUR) 10-240 MG 24 hr tablet Take 1 tablet by mouth daily.   metoprolol  tartrate (LOPRESSOR ) 25 MG tablet TAKE 1/2 TABLET BY MOUTH EVERY DAY   montelukast  (SINGULAIR ) 10 MG tablet Take 1 tablet (10 mg total) by mouth daily.   Multiple Vitamin (MULTIVITAMIN WITH MINERALS) TABS tablet Take 1 tablet by mouth daily.   ondansetron  (ZOFRAN ) 4 MG tablet Take 1 tablet (4 mg total) by mouth every 6 (six) hours. As needed for nausea and vomiting   pantoprazole  (PROTONIX ) 40 MG tablet TAKE 1 TABLET(40 MG) BY MOUTH DAILY   traMADol  (ULTRAM ) 50 MG tablet Take 1 tablet (50 mg total) by mouth every 6 (six) hours as needed.   zolpidem  (AMBIEN ) 10 MG tablet Take 1 tablet (10 mg total) by mouth at bedtime as needed for sleep.   ALPRAZolam  (XANAX ) 1 MG tablet TAKE 1 TABLET(1 MG) BY MOUTH TWICE DAILY (Patient not taking: Reported on 07/07/2024)   benzonatate  (TESSALON ) 200 MG capsule Take 1 capsule (200 mg total) by mouth 3 (three) times daily as needed. Swallow whole, do not chew (Patient not taking: Reported on 07/07/2024)   buPROPion  (WELLBUTRIN  XL) 150 MG 24 hr tablet Take 1 tablet (150 mg total) by mouth daily. (Patient not taking: Reported on 07/07/2024)   fluticasone  (FLONASE ) 50 MCG/ACT nasal spray SHAKE LIQUID AND USE 1 SPRAY IN EACH NOSTRIL DAILY AS NEEDED FOR ALLERGIES (Patient not taking: Reported on 07/07/2024)   potassium  chloride SA (KLOR-CON  M) 20 MEQ tablet Take 1 tablet (20 mEq total) by mouth 2 (two) times daily. (Patient not taking: Reported on 07/07/2024)   rosuvastatin  (CRESTOR ) 20 MG tablet TAKE 1 TABLET(20 MG) BY MOUTH DAILY (Patient not taking: Reported on 07/07/2024)   spironolactone  (ALDACTONE ) 25 MG tablet TAKE 1 TABLET(25 MG) BY MOUTH DAILY (Patient not taking: Reported on 07/07/2024)   Tiotropium Bromide -Olodaterol (STIOLTO RESPIMAT ) 2.5-2.5 MCG/ACT AERS INHALE 2 PUFFS BY MOUTH INTO THE LUNGS DAILY. (Patient not taking: Reported on 07/07/2024)   No facility-administered encounter medications on file as of 07/07/2024.    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   Past Surgical History:  Procedure Laterality Date   ABDOMINAL HYSTERECTOMY     BACK SURGERY     COLONOSCOPY WITH PROPOFOL  N/A 10/03/2023   Procedure: COLONOSCOPY WITH PROPOFOL ;  Surgeon: Rollin Dover, MD;  Location: [facility];  Service: Gastroenterology;  Laterality: N/A;   ESOPHAGOGASTRODUODENOSCOPY (EGD) WITH PROPOFOL  N/A 10/03/2023   Procedure: ESOPHAGOGASTRODUODENOSCOPY (EGD) WITH PROPOFOL ;  Surgeon: Rollin Dover, MD;  Location: [facility];  Service: Gastroenterology;  Laterality: N/A;   ILEO LOOP NEOBLADDER     SPINE SURGERY  VESICOVAGINAL FISTULA REPAIR     Family History  Problem Relation Age of Onset   Cancer Mother    Heart disease Father    Alcohol abuse Father    COPD Father    Stroke Father    Atrial fibrillation Sister    Alcohol abuse Sister    Heart disease Maternal Grandmother    Depression Maternal Grandfather    Depression Paternal Grandfather    Early death Maternal Aunt    Early death Maternal Uncle     Social History   Occupational History   Not on file  Tobacco Use   Smoking status: Former    Current packs/day: 0.00    Average packs/day: 2.0 packs/day for 45.0 years (90.0 ttl pk-yrs)    Types: Cigarettes    Start date: 04/10/1974    Quit date: 04/11/2019    Years since quitting: 5.2   Smokeless tobacco: Never  Vaping Use   Vaping status: Never Used  Substance and Sexual Activity   Alcohol use: No   Drug use: Never   Sexual activity: Yes   Tobacco Counseling Counseling given: Not Answered  SDOH Screenings   Food Insecurity: No Food Insecurity (07/07/2024)  Housing: Low Risk (07/07/2024)  Transportation Needs: No Transportation Needs (07/07/2024)  Utilities: Not At Risk (07/07/2024)  Alcohol Screen: Low Risk (04/23/2023)  Depression (PHQ2-9): High Risk (07/07/2024)  Financial Resource Strain: Low Risk (04/06/2023)  Physical Activity: Sufficiently Active (07/07/2024)  Social Connections: Socially Isolated (07/07/2024)  Stress: Stress Concern Present (07/07/2024)  Tobacco Use: Medium Risk (07/07/2024)  Health Literacy: Adequate Health Literacy (07/07/2024)   See flowsheets for full screening details  Depression Screen PHQ 2 & 9 Depression Scale- Over the past 2 weeks, how often have you been bothered by any of the following problems? Little interest or pleasure in doing things: 1 Feeling down, depressed, or hopeless (PHQ Adolescent also includes...irritable): 1 PHQ-2 Total Score: 2 Trouble falling or staying asleep, or sleeping too much: 2 Feeling tired or having little energy: 3 Poor appetite or overeating (PHQ Adolescent also includes...weight loss): 2 Feeling bad about yourself - or that you are a failure or have let yourself or your family down: 2 Trouble concentrating on things, such as reading the newspaper or watching television (PHQ Adolescent also includes...like school work): 1 Moving or speaking so slowly that other people could have noticed. Or the opposite -  being so fidgety or restless that you have been moving around a lot more than usual: 0 Thoughts that you would be better off dead, or of hurting yourself in some way: 0 PHQ-9 Total Score: 12  Depression Treatment Depression Interventions/Treatment : Currently on Treatment; Medication     Goals Addressed             This Visit's Progress    Weight (lb) < 115 lb (52.2 kg)   135 lb (61.2 kg)            Objective:    Today's Vitals   07/07/24 1210  Weight: 135 lb (61.2 kg)  Height: 5' (1.524 m)   Body mass index is 26.37 kg/m.  Hearing/Vision screen No results found. Immunizations and Health Maintenance Health Maintenance  Topic Date Due   Hepatitis C Screening  Never done   Pneumococcal Vaccine: 50+ Years (3 of 3 - PCV) 05/01/2020   Bone Density Scan  12/25/2022   COVID-19 Vaccine (6 - 2025-26 season) 02/09/2024   Zoster Vaccines- Shingrix (1 of 2) 07/13/2024 (Originally 12/24/1974)  Medicare Annual Wellness (AWV)  07/07/2025   Mammogram  06/09/2026   Colonoscopy  10/02/2033   DTaP/Tdap/Td (2 - Td or Tdap) 04/12/2034   Influenza Vaccine  Completed   Meningococcal B Vaccine  Aged Out   Lung Cancer Screening  Discontinued        Assessment/Plan:  This is a routine wellness examination for [place].  Patient Care Team: Duanne Butler DASEN, MD as PCP - General (Family Medicine) Okey Vina GAILS, MD as PCP - Cardiology (Cardiology) Shelah Lamar RAMAN, MD as Consulting Physician (Pulmonary Disease) Kristie Lamprey, MD as Consulting Physician (Gastroenterology) Feliciano Seena SAILOR, PA-C (Urology)  I have personally reviewed and noted the following in the patients chart:   Medical and social history Use of alcohol, tobacco or illicit drugs  Current medications and supplements including opioid prescriptions. Functional ability and status Nutritional status Physical activity Advanced directives List of other physicians Hospitalizations, surgeries, and ER visits in  previous 12 months Vitals Screenings to include cognitive, depression, and falls Referrals and appointments  No orders of the defined types were placed in this encounter.  In addition, I have reviewed and discussed with patient certain preventive protocols, quality metrics, and best practice recommendations. A written personalized care plan for preventive services as well as general preventive health recommendations were provided to patient.   Lavelle Charmaine Browner, [place]   8/71/7973   Return in 1 year (on 07/07/2025).  After Visit Summary: (MyChart) Due to this being a telephonic visit, the after visit summary with patients personalized plan was offered to patient via MyChart   Nurse Notes: Separate telephone note sent for (medication management and medication refill )  "

## 2024-07-07 NOTE — Telephone Encounter (Signed)
 Patient seen today for AWV and states that she had to discontinue Buproprion due to side effect of making her feel more depressed. Currently taking Lexapro .  Has noticed that she is having a problem with weight gain even though she is actively trying to lose. Last weight 118 lb and she reported that she is currently 135 lb. She believes that the weight gain is causing her increased breathing issues. Would like to know if there are any recommendations before scheduling office visit.   Also asking for a refill of Ambien 

## 2024-07-07 NOTE — Patient Instructions (Signed)
 Ms. Depaulo,  Thank you for taking the time for your Medicare Wellness Visit. I appreciate your continued commitment to your health goals. Please review the care plan we discussed, and feel free to reach out if I can assist you further.  Please note that Annual Wellness Visits do not include a physical exam. Some assessments may be limited, especially if the visit was conducted virtually. If needed, we may recommend an in-person follow-up with your provider.  Ongoing Care Seeing your primary care provider every 3 to 6 months helps us  monitor your health and provide consistent, personalized care.   Referrals If a referral was made during today's visit and you haven't received any updates within two weeks, please contact the referred provider directly to check on the status.  You have an order for:  []   2D Mammogram  []   3D Mammogram  [x]   Bone Density     Please call for appointment:   [facility] [address] [place], [place] [address] [phone]  Make sure to wear two-piece clothing.  No lotions, powders, or deodorants the day of the appointment. Make sure to bring picture ID and insurance card.  Bring list of medications you are currently taking including any supplements.   Recommended Screenings:  Health Maintenance  Topic Date Due   Hepatitis C Screening  Never done   Pneumococcal Vaccine for age over 22 (3 of 3 - PCV) 05/01/2020   Osteoporosis screening with Bone Density Scan  12/25/2022   COVID-19 Vaccine (6 - 2025-26 season) 02/09/2024   Zoster (Shingles) Vaccine (1 of 2) 07/13/2024*   Medicare Annual Wellness Visit  07/07/2025   Breast Cancer Screening  06/09/2026   Colon Cancer Screening  10/02/2033   DTaP/Tdap/Td vaccine (2 - Td or Tdap) 04/12/2034   Flu Shot  Completed   Meningitis B Vaccine  Aged Out   Screening for Lung Cancer  Discontinued  *Topic was postponed. The date shown is not the original due date.        07/07/2024   12:12 PM  Advanced Directives  Does Patient Have a Medical Advance Directive? Yes  Type of Estate Agent of [place];Living will  Does patient want to make changes to medical advance directive? Yes (MAU/Ambulatory/Procedural Areas - Information given)  Copy of Healthcare Power of Attorney in Chart? Yes - validated most recent copy scanned in chart (See row information)   Information on Advanced Care Planning can be found at [place]  Secretary of [facility] Advance Health Care Directives Advance Health Care Directives ([email or web address]    Vision: Annual vision screenings are recommended for early detection of glaucoma, cataracts, and diabetic retinopathy. These exams can also reveal signs of chronic conditions such as diabetes and high blood pressure.  Dental: Annual dental screenings help detect early signs of oral cancer, gum disease, and other conditions linked to overall health, including heart disease and diabetes.  Please see the attached documents for additional preventive care recommendations.

## 2024-07-08 MED ORDER — ZOLPIDEM TARTRATE 10 MG PO TABS: 10.0000 mg | ORAL_TABLET | Freq: Every evening | ORAL | 1 refills | Status: AC | PRN
